# Patient Record
Sex: Female | Born: 1985 | Race: Black or African American | Hispanic: No | Marital: Single | Smoking: Current every day smoker
Health system: Southern US, Community
[De-identification: ages and names within clinical notes are randomized; demographics above are authoritative.]

## PROBLEM LIST (undated history)

## (undated) ENCOUNTER — Inpatient Hospital Stay: Payer: Self-pay

## (undated) DIAGNOSIS — F419 Anxiety disorder, unspecified: Secondary | ICD-10-CM

## (undated) DIAGNOSIS — Z8639 Personal history of other endocrine, nutritional and metabolic disease: Secondary | ICD-10-CM

## (undated) DIAGNOSIS — F32A Depression, unspecified: Secondary | ICD-10-CM

## (undated) DIAGNOSIS — Z915 Personal history of self-harm: Secondary | ICD-10-CM

## (undated) DIAGNOSIS — O24419 Gestational diabetes mellitus in pregnancy, unspecified control: Secondary | ICD-10-CM

## (undated) DIAGNOSIS — F329 Major depressive disorder, single episode, unspecified: Secondary | ICD-10-CM

## (undated) DIAGNOSIS — Z9151 Personal history of suicidal behavior: Secondary | ICD-10-CM

## (undated) DIAGNOSIS — E079 Disorder of thyroid, unspecified: Secondary | ICD-10-CM

## (undated) DIAGNOSIS — F1911 Other psychoactive substance abuse, in remission: Secondary | ICD-10-CM

## (undated) DIAGNOSIS — I10 Essential (primary) hypertension: Secondary | ICD-10-CM

## (undated) HISTORY — PX: WISDOM TOOTH EXTRACTION: SHX21

---

## 2005-12-23 ENCOUNTER — Emergency Department: Payer: Self-pay | Admitting: Emergency Medicine

## 2005-12-24 ENCOUNTER — Emergency Department: Payer: Self-pay | Admitting: Unknown Physician Specialty

## 2006-02-05 ENCOUNTER — Emergency Department: Payer: Self-pay | Admitting: Internal Medicine

## 2007-10-12 ENCOUNTER — Ambulatory Visit: Payer: Self-pay

## 2007-11-21 ENCOUNTER — Emergency Department: Payer: Self-pay | Admitting: Emergency Medicine

## 2008-06-17 ENCOUNTER — Emergency Department: Payer: Self-pay | Admitting: Unknown Physician Specialty

## 2009-03-04 ENCOUNTER — Emergency Department: Payer: Self-pay | Admitting: Emergency Medicine

## 2009-06-12 ENCOUNTER — Ambulatory Visit: Payer: Self-pay

## 2009-09-24 ENCOUNTER — Emergency Department: Payer: Self-pay | Admitting: Emergency Medicine

## 2009-10-21 ENCOUNTER — Emergency Department: Payer: Self-pay | Admitting: Emergency Medicine

## 2010-03-29 ENCOUNTER — Ambulatory Visit: Payer: Self-pay

## 2010-09-01 ENCOUNTER — Emergency Department: Payer: Self-pay | Admitting: Emergency Medicine

## 2012-05-30 ENCOUNTER — Encounter: Payer: Self-pay | Admitting: Obstetrics & Gynecology

## 2012-07-25 ENCOUNTER — Encounter: Payer: Self-pay | Admitting: Maternal & Fetal Medicine

## 2012-08-05 ENCOUNTER — Observation Stay: Payer: Self-pay

## 2012-08-05 LAB — URINALYSIS, COMPLETE
Blood: NEGATIVE
Ketone: NEGATIVE
Nitrite: NEGATIVE
Ph: 7 (ref 4.5–8.0)
Protein: NEGATIVE
Specific Gravity: 1.015 (ref 1.003–1.030)

## 2012-08-13 ENCOUNTER — Encounter: Payer: Self-pay | Admitting: Maternal & Fetal Medicine

## 2012-08-25 ENCOUNTER — Encounter: Payer: Self-pay | Admitting: Maternal & Fetal Medicine

## 2012-09-05 ENCOUNTER — Encounter: Payer: Self-pay | Admitting: Maternal and Fetal Medicine

## 2012-09-23 ENCOUNTER — Encounter: Payer: Self-pay | Admitting: Obstetrics and Gynecology

## 2012-09-24 ENCOUNTER — Emergency Department: Payer: Self-pay | Admitting: Emergency Medicine

## 2012-09-24 LAB — COMPREHENSIVE METABOLIC PANEL
Albumin: 2.6 g/dL — ABNORMAL LOW (ref 3.4–5.0)
Alkaline Phosphatase: 102 U/L (ref 50–136)
Anion Gap: 11 (ref 7–16)
BUN: 5 mg/dL — ABNORMAL LOW (ref 7–18)
Bilirubin,Total: 0.3 mg/dL (ref 0.2–1.0)
Calcium, Total: 8.2 mg/dL — ABNORMAL LOW (ref 8.5–10.1)
Chloride: 109 mmol/L — ABNORMAL HIGH (ref 98–107)
Potassium: 4 mmol/L (ref 3.5–5.1)
SGOT(AST): 8 U/L — ABNORMAL LOW (ref 15–37)
SGPT (ALT): 10 U/L — ABNORMAL LOW (ref 12–78)
Sodium: 141 mmol/L (ref 136–145)
Total Protein: 6.4 g/dL (ref 6.4–8.2)

## 2012-09-24 LAB — CBC
HGB: 11.6 g/dL — ABNORMAL LOW (ref 12.0–16.0)
MCH: 30.7 pg (ref 26.0–34.0)
MCHC: 34.6 g/dL (ref 32.0–36.0)
MCV: 89 fL (ref 80–100)
RBC: 3.77 10*6/uL — ABNORMAL LOW (ref 3.80–5.20)
RDW: 13.7 % (ref 11.5–14.5)

## 2012-09-24 LAB — URINALYSIS, COMPLETE
Bacteria: NONE SEEN
Blood: NEGATIVE
Glucose,UR: NEGATIVE mg/dL (ref 0–75)
Nitrite: NEGATIVE
RBC,UR: <1 /HPF (ref 0–5)
Specific Gravity: 1.015 (ref 1.003–1.030)
WBC UR: <1 /HPF (ref 0–5)

## 2012-09-26 ENCOUNTER — Encounter: Payer: Self-pay | Admitting: Maternal & Fetal Medicine

## 2012-09-30 ENCOUNTER — Encounter: Payer: Self-pay | Admitting: Maternal & Fetal Medicine

## 2012-10-01 ENCOUNTER — Observation Stay: Payer: Self-pay | Admitting: Obstetrics and Gynecology

## 2012-10-01 LAB — URINALYSIS, COMPLETE
Bacteria: NONE SEEN
Bilirubin,UR: NEGATIVE
Ph: 6 (ref 4.5–8.0)
Protein: 100
RBC,UR: 2158 /HPF (ref 0–5)
Squamous Epithelial: 1

## 2012-10-03 ENCOUNTER — Encounter: Payer: Self-pay | Admitting: Maternal and Fetal Medicine

## 2012-10-03 LAB — URINE CULTURE

## 2012-10-10 ENCOUNTER — Encounter: Payer: Self-pay | Admitting: Obstetrics and Gynecology

## 2012-10-14 ENCOUNTER — Encounter: Payer: Self-pay | Admitting: Obstetrics and Gynecology

## 2012-10-17 ENCOUNTER — Encounter: Payer: Self-pay | Admitting: Obstetrics and Gynecology

## 2012-10-21 ENCOUNTER — Encounter: Payer: Self-pay | Admitting: Obstetrics and Gynecology

## 2012-10-24 ENCOUNTER — Encounter: Payer: Self-pay | Admitting: Maternal and Fetal Medicine

## 2012-10-28 ENCOUNTER — Encounter: Payer: Self-pay | Admitting: Obstetrics & Gynecology

## 2012-10-28 ENCOUNTER — Observation Stay: Payer: Self-pay

## 2012-10-28 LAB — URINALYSIS, COMPLETE
Bacteria: NONE SEEN
Bilirubin,UR: NEGATIVE
Blood: NEGATIVE
Glucose,UR: NEGATIVE mg/dL (ref 0–75)
Ketone: NEGATIVE
Ph: 7 (ref 4.5–8.0)
Squamous Epithelial: 3

## 2012-10-30 ENCOUNTER — Observation Stay: Payer: Self-pay

## 2012-10-30 LAB — URINALYSIS, COMPLETE
Bilirubin,UR: NEGATIVE
Glucose,UR: NEGATIVE mg/dL (ref 0–75)
RBC,UR: 1 /HPF (ref 0–5)
Specific Gravity: 1.017 (ref 1.003–1.030)
Squamous Epithelial: 31
WBC UR: 7 /HPF (ref 0–5)

## 2012-11-01 ENCOUNTER — Observation Stay: Payer: Self-pay

## 2012-11-04 ENCOUNTER — Encounter: Payer: Self-pay | Admitting: Obstetrics and Gynecology

## 2012-11-06 LAB — URINALYSIS, COMPLETE
Bacteria: NONE SEEN
Bilirubin,UR: NEGATIVE
Blood: NEGATIVE
Glucose,UR: NEGATIVE mg/dL (ref 0–75)
Nitrite: NEGATIVE
Protein: 30
RBC,UR: 1 /HPF (ref 0–5)
Specific Gravity: 1.028 (ref 1.003–1.030)
Squamous Epithelial: 13
WBC UR: 5 /HPF (ref 0–5)

## 2012-11-06 LAB — TSH: Thyroid Stimulating Horm: 1.83 u[IU]/mL

## 2012-11-06 LAB — COMPREHENSIVE METABOLIC PANEL
Anion Gap: 13 (ref 7–16)
BUN: 8 mg/dL (ref 7–18)
Calcium, Total: 8.7 mg/dL (ref 8.5–10.1)
Co2: 18 mmol/L — ABNORMAL LOW (ref 21–32)
Creatinine: 0.54 mg/dL — ABNORMAL LOW (ref 0.60–1.30)
EGFR (African American): >60
EGFR (Non-African Amer.): >60
Osmolality: 269 (ref 275–301)
Potassium: 4.2 mmol/L (ref 3.5–5.1)
SGPT (ALT): 16 U/L (ref 12–78)
Sodium: 136 mmol/L (ref 136–145)

## 2012-11-06 LAB — CBC
HCT: 36.2 % (ref 35.0–47.0)
HGB: 13 g/dL (ref 12.0–16.0)
MCH: 32 pg (ref 26.0–34.0)
MCHC: 35.8 g/dL (ref 32.0–36.0)
MCV: 89 fL (ref 80–100)
RBC: 4.06 10*6/uL (ref 3.80–5.20)
WBC: 9.8 10*3/uL (ref 3.6–11.0)

## 2012-11-06 LAB — DRUG SCREEN, URINE
Amphetamines, Ur Screen: NEGATIVE (ref ?–1000)
Benzodiazepine, Ur Scrn: NEGATIVE (ref ?–200)
Cannabinoid 50 Ng, Ur ~~LOC~~: POSITIVE (ref ?–50)
Methadone, Ur Screen: NEGATIVE (ref ?–300)
Phencyclidine (PCP) Ur S: NEGATIVE (ref ?–25)

## 2012-11-06 LAB — ETHANOL: Ethanol %: <0.003 % (ref 0.000–0.080)

## 2012-11-06 LAB — ACETAMINOPHEN LEVEL: Acetaminophen: <2 ug/mL

## 2012-11-06 LAB — SALICYLATE LEVEL: Salicylates, Serum: <1.7 mg/dL

## 2012-11-07 ENCOUNTER — Encounter: Payer: Self-pay | Admitting: Obstetrics & Gynecology

## 2012-11-07 ENCOUNTER — Inpatient Hospital Stay: Payer: Self-pay | Admitting: Psychiatry

## 2012-11-08 ENCOUNTER — Observation Stay: Payer: Self-pay | Admitting: Obstetrics and Gynecology

## 2012-11-08 LAB — FOLATE: Folic Acid: 28.6 ng/mL (ref 3.1–100.0)

## 2012-11-09 ENCOUNTER — Inpatient Hospital Stay: Payer: Self-pay | Admitting: Psychiatry

## 2012-11-11 ENCOUNTER — Encounter: Payer: Self-pay | Admitting: Obstetrics and Gynecology

## 2012-11-11 LAB — DRUG SCREEN, URINE
Amphetamines, Ur Screen: NEGATIVE (ref ?–1000)
Barbiturates, Ur Screen: NEGATIVE (ref ?–200)
Benzodiazepine, Ur Scrn: NEGATIVE (ref ?–200)
Methadone, Ur Screen: NEGATIVE (ref ?–300)
Opiate, Ur Screen: NEGATIVE (ref ?–300)
Tricyclic, Ur Screen: NEGATIVE (ref ?–1000)

## 2012-11-14 ENCOUNTER — Encounter: Payer: Self-pay | Admitting: Obstetrics and Gynecology

## 2012-11-18 ENCOUNTER — Encounter: Payer: Self-pay | Admitting: Obstetrics & Gynecology

## 2012-11-23 ENCOUNTER — Observation Stay: Payer: Self-pay | Admitting: Obstetrics and Gynecology

## 2012-11-23 LAB — URINALYSIS, COMPLETE
Bacteria: NONE SEEN
Ketone: NEGATIVE
Leukocyte Esterase: NEGATIVE
Nitrite: NEGATIVE
Ph: 6 (ref 4.5–8.0)
Protein: NEGATIVE
Specific Gravity: 1.017 (ref 1.003–1.030)
WBC UR: 1 /HPF (ref 0–5)

## 2012-11-25 ENCOUNTER — Inpatient Hospital Stay: Payer: Self-pay | Admitting: Obstetrics and Gynecology

## 2012-11-25 LAB — CBC WITH DIFFERENTIAL/PLATELET
Basophil %: 0.3 %
Eosinophil %: 2.2 %
HCT: 32.8 % — ABNORMAL LOW (ref 35.0–47.0)
HGB: 11.1 g/dL — ABNORMAL LOW (ref 12.0–16.0)
Lymphocyte %: 24.3 %
MCHC: 34 g/dL (ref 32.0–36.0)
Monocyte %: 7.3 %
Neutrophil #: 6.4 10*3/uL (ref 1.4–6.5)
Platelet: 142 10*3/uL — ABNORMAL LOW (ref 150–440)
WBC: 9.7 10*3/uL (ref 3.6–11.0)

## 2012-11-25 LAB — DRUG SCREEN, URINE
Amphetamines, Ur Screen: NEGATIVE (ref ?–1000)
Barbiturates, Ur Screen: NEGATIVE (ref ?–200)
Benzodiazepine, Ur Scrn: NEGATIVE (ref ?–200)
Cocaine Metabolite,Ur ~~LOC~~: NEGATIVE (ref ?–300)
Methadone, Ur Screen: NEGATIVE (ref ?–300)
Opiate, Ur Screen: NEGATIVE (ref ?–300)
Phencyclidine (PCP) Ur S: NEGATIVE (ref ?–25)
Tricyclic, Ur Screen: NEGATIVE (ref ?–1000)

## 2012-11-26 LAB — HEMATOCRIT: HCT: 27 % — ABNORMAL LOW (ref 35.0–47.0)

## 2012-11-29 ENCOUNTER — Emergency Department: Payer: Self-pay | Admitting: Unknown Physician Specialty

## 2013-07-11 ENCOUNTER — Emergency Department: Payer: Self-pay | Admitting: Emergency Medicine

## 2013-07-11 LAB — COMPREHENSIVE METABOLIC PANEL
Alkaline Phosphatase: 73 U/L (ref 50–136)
BUN: 15 mg/dL (ref 7–18)
Bilirubin,Total: 0.3 mg/dL (ref 0.2–1.0)
Calcium, Total: 8.7 mg/dL (ref 8.5–10.1)
Chloride: 112 mmol/L — ABNORMAL HIGH (ref 98–107)
Co2: 23 mmol/L (ref 21–32)
EGFR (African American): >60
EGFR (Non-African Amer.): >60
Glucose: 109 mg/dL — ABNORMAL HIGH (ref 65–99)
Osmolality: 283 (ref 275–301)
Potassium: 4 mmol/L (ref 3.5–5.1)
SGPT (ALT): 10 U/L — ABNORMAL LOW (ref 12–78)
Sodium: 141 mmol/L (ref 136–145)
Total Protein: 7.6 g/dL (ref 6.4–8.2)

## 2013-07-11 LAB — URINALYSIS, COMPLETE
Bilirubin,UR: NEGATIVE
Ketone: NEGATIVE
Nitrite: NEGATIVE
Ph: 5 (ref 4.5–8.0)
Protein: 30
WBC UR: 5 /HPF (ref 0–5)

## 2013-07-11 LAB — TSH: Thyroid Stimulating Horm: 0.86 u[IU]/mL

## 2013-07-11 LAB — CBC
HCT: 37 % (ref 35.0–47.0)
MCH: 28.9 pg (ref 26.0–34.0)
MCHC: 32.8 g/dL (ref 32.0–36.0)
MCV: 88 fL (ref 80–100)
RDW: 14.6 % — ABNORMAL HIGH (ref 11.5–14.5)
WBC: 7.5 10*3/uL (ref 3.6–11.0)

## 2013-07-11 LAB — DRUG SCREEN, URINE
Barbiturates, Ur Screen: NEGATIVE (ref ?–200)
Benzodiazepine, Ur Scrn: POSITIVE (ref ?–200)
Cocaine Metabolite,Ur ~~LOC~~: POSITIVE (ref ?–300)
MDMA (Ecstasy)Ur Screen: NEGATIVE (ref ?–500)
Methadone, Ur Screen: NEGATIVE (ref ?–300)
Opiate, Ur Screen: NEGATIVE (ref ?–300)
Phencyclidine (PCP) Ur S: NEGATIVE (ref ?–25)

## 2013-07-11 LAB — ETHANOL
Ethanol %: <0.003 % (ref 0.000–0.080)
Ethanol: <3 mg/dL

## 2013-08-12 ENCOUNTER — Emergency Department: Payer: Self-pay | Admitting: Internal Medicine

## 2013-08-12 LAB — COMPREHENSIVE METABOLIC PANEL
Alkaline Phosphatase: 84 U/L (ref 50–136)
BUN: 12 mg/dL (ref 7–18)
Bilirubin,Total: 0.4 mg/dL (ref 0.2–1.0)
Calcium, Total: 9 mg/dL (ref 8.5–10.1)
Chloride: 106 mmol/L (ref 98–107)
Co2: 27 mmol/L (ref 21–32)
EGFR (African American): >60
EGFR (Non-African Amer.): >60
Glucose: 85 mg/dL (ref 65–99)
Potassium: 4.1 mmol/L (ref 3.5–5.1)
SGPT (ALT): 12 U/L (ref 12–78)
Sodium: 137 mmol/L (ref 136–145)
Total Protein: 7.8 g/dL (ref 6.4–8.2)

## 2013-08-12 LAB — URINALYSIS, COMPLETE
Bacteria: NONE SEEN
Bilirubin,UR: NEGATIVE
Glucose,UR: NEGATIVE mg/dL (ref 0–75)
RBC,UR: <1 /HPF (ref 0–5)
Squamous Epithelial: 2
WBC UR: 1 /HPF (ref 0–5)

## 2013-08-12 LAB — CBC
HGB: 14 g/dL (ref 12.0–16.0)
MCV: 87 fL (ref 80–100)
RBC: 4.68 10*6/uL (ref 3.80–5.20)
RDW: 13.8 % (ref 11.5–14.5)
WBC: 3.6 10*3/uL (ref 3.6–11.0)

## 2013-12-30 ENCOUNTER — Emergency Department: Payer: Self-pay | Admitting: Emergency Medicine

## 2013-12-30 LAB — CBC
HCT: 39.6 % (ref 35.0–47.0)
HGB: 13.1 g/dL (ref 12.0–16.0)
MCH: 29.3 pg (ref 26.0–34.0)
MCHC: 33.1 g/dL (ref 32.0–36.0)
MCV: 89 fL (ref 80–100)
PLATELETS: 154 10*3/uL (ref 150–440)
RBC: 4.46 10*6/uL (ref 3.80–5.20)
RDW: 13.7 % (ref 11.5–14.5)
WBC: 6.4 10*3/uL (ref 3.6–11.0)

## 2013-12-30 LAB — COMPREHENSIVE METABOLIC PANEL
ALK PHOS: 67 U/L
AST: 11 U/L — AB (ref 15–37)
Albumin: 3.7 g/dL (ref 3.4–5.0)
Anion Gap: 2 — ABNORMAL LOW (ref 7–16)
BUN: 9 mg/dL (ref 7–18)
Bilirubin,Total: 0.4 mg/dL (ref 0.2–1.0)
CALCIUM: 8.7 mg/dL (ref 8.5–10.1)
Chloride: 106 mmol/L (ref 98–107)
Co2: 26 mmol/L (ref 21–32)
Creatinine: 0.79 mg/dL (ref 0.60–1.30)
EGFR (Non-African Amer.): >60
Glucose: 91 mg/dL (ref 65–99)
Osmolality: 267 (ref 275–301)
Potassium: 3.8 mmol/L (ref 3.5–5.1)
SGPT (ALT): 12 U/L (ref 12–78)
SODIUM: 134 mmol/L — AB (ref 136–145)
Total Protein: 7.4 g/dL (ref 6.4–8.2)

## 2013-12-30 LAB — URINALYSIS, COMPLETE
BACTERIA: NONE SEEN
BILIRUBIN, UR: NEGATIVE
Blood: NEGATIVE
GLUCOSE, UR: NEGATIVE mg/dL (ref 0–75)
Ketone: NEGATIVE
LEUKOCYTE ESTERASE: NEGATIVE
Nitrite: NEGATIVE
Ph: 6 (ref 4.5–8.0)
Protein: NEGATIVE
RBC,UR: 2 /HPF (ref 0–5)
Specific Gravity: 1.025 (ref 1.003–1.030)
WBC UR: 1 /HPF (ref 0–5)

## 2013-12-30 LAB — LIPASE, BLOOD: LIPASE: 92 U/L (ref 73–393)

## 2013-12-30 LAB — HCG, QUANTITATIVE, PREGNANCY: Beta Hcg, Quant.: 486 m[IU]/mL — ABNORMAL HIGH

## 2014-01-06 ENCOUNTER — Observation Stay: Payer: Self-pay | Admitting: Obstetrics & Gynecology

## 2014-01-06 LAB — CBC WITH DIFFERENTIAL/PLATELET
BASOS ABS: 0 10*3/uL (ref 0.0–0.1)
Basophil %: 0.4 %
EOS ABS: 0.1 10*3/uL (ref 0.0–0.7)
Eosinophil %: 2.3 %
HCT: 37.9 % (ref 35.0–47.0)
HGB: 12.7 g/dL (ref 12.0–16.0)
LYMPHS PCT: 50 %
Lymphocyte #: 2.8 10*3/uL (ref 1.0–3.6)
MCH: 29.6 pg (ref 26.0–34.0)
MCHC: 33.6 g/dL (ref 32.0–36.0)
MCV: 88 fL (ref 80–100)
Monocyte #: 0.3 x10 3/mm (ref 0.2–0.9)
Monocyte %: 4.6 %
NEUTROS PCT: 42.7 %
Neutrophil #: 2.4 10*3/uL (ref 1.4–6.5)
PLATELETS: 182 10*3/uL (ref 150–440)
RBC: 4.31 10*6/uL (ref 3.80–5.20)
RDW: 13.5 % (ref 11.5–14.5)
WBC: 5.6 10*3/uL (ref 3.6–11.0)

## 2014-01-06 LAB — URINALYSIS, COMPLETE
BLOOD: NEGATIVE
Bilirubin,UR: NEGATIVE
Glucose,UR: NEGATIVE mg/dL (ref 0–75)
Ketone: NEGATIVE
Leukocyte Esterase: NEGATIVE
NITRITE: NEGATIVE
PROTEIN: NEGATIVE
Ph: 5 (ref 4.5–8.0)
Specific Gravity: 1.028 (ref 1.003–1.030)

## 2014-01-06 LAB — COMPREHENSIVE METABOLIC PANEL
ALBUMIN: 3.8 g/dL (ref 3.4–5.0)
AST: 15 U/L (ref 15–37)
Alkaline Phosphatase: 70 U/L
Anion Gap: 2 — ABNORMAL LOW (ref 7–16)
BUN: 9 mg/dL (ref 7–18)
Bilirubin,Total: 0.4 mg/dL (ref 0.2–1.0)
Calcium, Total: 9 mg/dL (ref 8.5–10.1)
Chloride: 105 mmol/L (ref 98–107)
Co2: 28 mmol/L (ref 21–32)
Creatinine: 0.77 mg/dL (ref 0.60–1.30)
EGFR (Non-African Amer.): >60
GLUCOSE: 95 mg/dL (ref 65–99)
Osmolality: 269 (ref 275–301)
Potassium: 3.9 mmol/L (ref 3.5–5.1)
SGPT (ALT): 14 U/L (ref 12–78)
SODIUM: 135 mmol/L — AB (ref 136–145)
Total Protein: 7.7 g/dL (ref 6.4–8.2)

## 2014-01-06 LAB — HCG, QUANTITATIVE, PREGNANCY: Beta Hcg, Quant.: 256 m[IU]/mL — ABNORMAL HIGH

## 2014-01-06 LAB — WET PREP, GENITAL

## 2014-01-06 LAB — GC/CHLAMYDIA PROBE AMP

## 2014-01-07 LAB — HCG, QUANTITATIVE, PREGNANCY: Beta Hcg, Quant.: 212 m[IU]/mL — ABNORMAL HIGH

## 2014-01-22 ENCOUNTER — Ambulatory Visit: Payer: Self-pay | Admitting: Family Medicine

## 2014-03-05 ENCOUNTER — Emergency Department: Payer: Self-pay | Admitting: Emergency Medicine

## 2014-03-05 LAB — URINALYSIS, COMPLETE
BACTERIA: NONE SEEN
BILIRUBIN, UR: NEGATIVE
GLUCOSE, UR: NEGATIVE mg/dL (ref 0–75)
Ketone: NEGATIVE
Leukocyte Esterase: NEGATIVE
NITRITE: NEGATIVE
PH: 5 (ref 4.5–8.0)
Protein: NEGATIVE
RBC,UR: 11 /HPF (ref 0–5)
Specific Gravity: 1.011 (ref 1.003–1.030)
Squamous Epithelial: NONE SEEN

## 2014-03-05 LAB — COMPREHENSIVE METABOLIC PANEL
ALK PHOS: 74 U/L
Albumin: 3.4 g/dL (ref 3.4–5.0)
Anion Gap: 5 — ABNORMAL LOW (ref 7–16)
BILIRUBIN TOTAL: 0.4 mg/dL (ref 0.2–1.0)
BUN: 6 mg/dL — ABNORMAL LOW (ref 7–18)
CHLORIDE: 108 mmol/L — AB (ref 98–107)
CO2: 27 mmol/L (ref 21–32)
Calcium, Total: 8.8 mg/dL (ref 8.5–10.1)
Creatinine: 0.87 mg/dL (ref 0.60–1.30)
Glucose: 108 mg/dL — ABNORMAL HIGH (ref 65–99)
Osmolality: 278 (ref 275–301)
POTASSIUM: 3.5 mmol/L (ref 3.5–5.1)
SGPT (ALT): 8 U/L — ABNORMAL LOW (ref 12–78)
Sodium: 140 mmol/L (ref 136–145)
Total Protein: 7.4 g/dL (ref 6.4–8.2)

## 2014-03-05 LAB — CBC WITH DIFFERENTIAL/PLATELET
Basophil #: 0 10*3/uL (ref 0.0–0.1)
Basophil %: 0.4 %
EOS PCT: 2.3 %
Eosinophil #: 0.2 10*3/uL (ref 0.0–0.7)
HCT: 37.1 % (ref 35.0–47.0)
HGB: 12 g/dL (ref 12.0–16.0)
LYMPHS ABS: 3 10*3/uL (ref 1.0–3.6)
LYMPHS PCT: 33.7 %
MCH: 29 pg (ref 26.0–34.0)
MCHC: 32.3 g/dL (ref 32.0–36.0)
MCV: 90 fL (ref 80–100)
MONOS PCT: 7 %
Monocyte #: 0.6 x10 3/mm (ref 0.2–0.9)
NEUTROS ABS: 5 10*3/uL (ref 1.4–6.5)
NEUTROS PCT: 56.6 %
PLATELETS: 168 10*3/uL (ref 150–440)
RBC: 4.14 10*6/uL (ref 3.80–5.20)
RDW: 13.6 % (ref 11.5–14.5)
WBC: 8.8 10*3/uL (ref 3.6–11.0)

## 2014-03-05 LAB — LIPASE, BLOOD: LIPASE: 126 U/L (ref 73–393)

## 2014-11-09 ENCOUNTER — Emergency Department: Payer: Self-pay | Admitting: Emergency Medicine

## 2014-12-21 ENCOUNTER — Emergency Department: Payer: Self-pay | Admitting: Emergency Medicine

## 2014-12-21 LAB — COMPREHENSIVE METABOLIC PANEL
Albumin: 3.6 g/dL (ref 3.4–5.0)
Alkaline Phosphatase: 62 U/L
Anion Gap: 7 (ref 7–16)
BILIRUBIN TOTAL: 0.7 mg/dL (ref 0.2–1.0)
BUN: 8 mg/dL (ref 7–18)
CALCIUM: 8.9 mg/dL (ref 8.5–10.1)
CHLORIDE: 105 mmol/L (ref 98–107)
Co2: 23 mmol/L (ref 21–32)
Creatinine: 0.69 mg/dL (ref 0.60–1.30)
EGFR (African American): >60
Glucose: 102 mg/dL — ABNORMAL HIGH (ref 65–99)
Osmolality: 269 (ref 275–301)
Potassium: 3.9 mmol/L (ref 3.5–5.1)
SGOT(AST): 18 U/L (ref 15–37)
SGPT (ALT): 10 U/L — ABNORMAL LOW
Sodium: 135 mmol/L — ABNORMAL LOW (ref 136–145)
TOTAL PROTEIN: 7.9 g/dL (ref 6.4–8.2)

## 2014-12-21 LAB — URINALYSIS, COMPLETE
Bacteria: NONE SEEN
Bilirubin,UR: NEGATIVE
Blood: NEGATIVE
GLUCOSE, UR: NEGATIVE mg/dL (ref 0–75)
Ketone: NEGATIVE
Leukocyte Esterase: NEGATIVE
NITRITE: NEGATIVE
PROTEIN: NEGATIVE
Ph: 6 (ref 4.5–8.0)
RBC,UR: NONE SEEN /HPF (ref 0–5)
Specific Gravity: 1.025 (ref 1.003–1.030)
Squamous Epithelial: 4
WBC UR: NONE SEEN /HPF (ref 0–5)

## 2014-12-21 LAB — CBC WITH DIFFERENTIAL/PLATELET
BASOS PCT: 0.7 %
Basophil #: 0 10*3/uL (ref 0.0–0.1)
Eosinophil #: 0.6 10*3/uL (ref 0.0–0.7)
Eosinophil %: 10.9 %
HCT: 41 % (ref 35.0–47.0)
HGB: 13.4 g/dL (ref 12.0–16.0)
LYMPHS PCT: 43.8 %
Lymphocyte #: 2.3 10*3/uL (ref 1.0–3.6)
MCH: 28.7 pg (ref 26.0–34.0)
MCHC: 32.6 g/dL (ref 32.0–36.0)
MCV: 88 fL (ref 80–100)
Monocyte #: 0.2 x10 3/mm (ref 0.2–0.9)
Monocyte %: 4.1 %
NEUTROS ABS: 2.1 10*3/uL (ref 1.4–6.5)
Neutrophil %: 40.5 %
PLATELETS: 190 10*3/uL (ref 150–440)
RBC: 4.66 10*6/uL (ref 3.80–5.20)
RDW: 14.3 % (ref 11.5–14.5)
WBC: 5.2 10*3/uL (ref 3.6–11.0)

## 2014-12-21 LAB — HCG, QUANTITATIVE, PREGNANCY: Beta Hcg, Quant.: 50060 m[IU]/mL — ABNORMAL HIGH

## 2015-01-05 LAB — OB RESULTS CONSOLE VARICELLA ZOSTER ANTIBODY, IGG: Varicella: IMMUNE

## 2015-01-05 LAB — OB RESULTS CONSOLE RUBELLA ANTIBODY, IGM: Rubella: IMMUNE

## 2015-01-05 LAB — OB RESULTS CONSOLE HIV ANTIBODY (ROUTINE TESTING): HIV: NONREACTIVE

## 2015-01-05 LAB — OB RESULTS CONSOLE ANTIBODY SCREEN: ANTIBODY SCREEN: NEGATIVE

## 2015-01-05 LAB — OB RESULTS CONSOLE ABO/RH: RH Type: POSITIVE

## 2015-01-05 LAB — OB RESULTS CONSOLE GC/CHLAMYDIA
CHLAMYDIA, DNA PROBE: NEGATIVE
Gonorrhea: NEGATIVE

## 2015-01-05 LAB — OB RESULTS CONSOLE HEPATITIS B SURFACE ANTIGEN: Hepatitis B Surface Ag: NEGATIVE

## 2015-01-05 LAB — OB RESULTS CONSOLE RPR: RPR: NONREACTIVE

## 2015-01-08 ENCOUNTER — Emergency Department: Payer: Self-pay | Admitting: Emergency Medicine

## 2015-01-11 LAB — BETA STREP CULTURE(ARMC)

## 2015-01-24 ENCOUNTER — Encounter
Admit: 2015-01-24 | Disposition: A | Discharge status: Home / Self Care | Payer: Self-pay | Admitting: Obstetrics and Gynecology

## 2015-02-23 ENCOUNTER — Emergency Department: Payer: Self-pay | Admitting: Emergency Medicine

## 2015-04-13 NOTE — Consult Note (Signed)
Referral Information:   Reason for Referral 29 yo G3 P0201 EDD 12/04/2012 by LMP consistent with 13 weeks ultrasound performed at [facility] on 05/30/12.  She is now 21 weeks 1 day and is referred due to history of preterm birth and infant mortality at 1 year of life.  In 2000, she had a pregnancy complicated by fetal growth restriction, lisencephaly, and congenital heart defect (vsd, pulm hypertension).  The karyotype was normal.  This child was born via cesarean section at 35 weeks, due to worsening IUGR and breech status.  He later died at the age of 1.    Referring Physician [facility] Department    Prenatal Hx as above    Past Obstetrical Hx 2000, primary LTCS(breech, worsening IUGR), [redacted] weeks gestation, female 'Tyshon', 1975g. 2002, repeat c/s (arrest of dilation) at 75-37w, female, 2930g   Home Medications: Medication Instructions Status  Prenatal Multivitamins oral tablet 1 tab(s) orally once a day Active  progesterone 1  subcutaneous once a week--17P injection Active   Allergies:   Bananas: Itching, Resp. Distress, Swelling  Peanuts: Itching, Resp. Distress, Swelling  Aspirin: N/V/Diarrhea, Other  Vital Signs/Notes:  Nursing Vital Signs: **Vital Signs.:   01-Aug-13 08:05   Vital Signs Type Routine   Temperature Temperature (F) 97.8   Celsius 36.5   Temperature Source oral   Pulse Pulse 79   Pulse source if not from Vital Sign Device dinamap   Respirations Respirations 14   Systolic BP Systolic BP 107   Diastolic BP (mmHg) Diastolic BP (mmHg) 61   Mean BP 76   BP Source  if not from Vital Sign Device dinamap   Perinatal Consult:   PMed Hx Hx of varicella    Past Medical History cont'd history of hospitalization for postpartum depression following birth of first child--suicidal ideation.    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 Previous pregnancy complicated by severe fetal growth restriction and anomalies.  Normal karyotype.  She had a consultation with our genetic counselors and is aware of the sporadic nature of this consteallation of findings and of the low risk for recurrence.  We reviewed plans for surveillance.  I would recommend fetal growth assessment(as outlined below) and a fetal echocardiogram in the current pregnancy. 2. Previous late preterm births.  One of the deliveries was an iatrogenic preterm birth due to severe growth restriction.  She reports spontaneous labor at ~36 weeks in the second pregnancy and delivery due to repet c/s because of arrest of dilaation. Her baseline cervical length was >3cm today. She is currently receiving 17 hydroxy progesterone injections for reduction of recurrent preterm birth risk. 3. Previous history of hospitalization for severe postpartum depression following birth of first child.    Recommendations 1. Recommend follow up growth surveillance at ~28 and 34 weeks 2. Recommend fetal echocardiogram (scheduled). 3. Recommend close observation and psychological support during pregnancy and postpartum. She reports use of "[facility]" services for psychological support.  I encouraged her to notify providers if she feels her mood is worsening in order for her to initiate antidepressant agents prior to delivery, if necessary. 4. Previous cesarean delivery x 2--planning repeat cesarean   Plan:   Genetic Counseling yes    Prenatal  Diagnosis Options Level II US    Ultrasound at what gestational ages 4628 and 534 weeks    Delivery Mode Cesarean     Total Time Spent with Patient 45 minutes    >50% of visit spent in couseling/coordination of care yes    Office Use Only 99243  Level 3 (40min) NEW office consult detailed   Coding Description: MATERNAL  CONDITIONS/HISTORY INDICATION(S).   Previous C-section.   Previous stillborn or neonatal death - Poor Reproductive History.   OTHER: previous child with growth restriction, lisencephaly, cardiac defect.  Electronic Signatures: Consuelo PandySmall, Mane Consolo (MD)  (Signed 01-Aug-13 13:25)  Authored: Referral, Home Medications, Allergies, Vital Signs/Notes, Consult, Impression, Plan, Billing, Coding Description   Last Updated: 01-Aug-13 13:25 by Consuelo PandySmall, Garima Chronis (MD)

## 2015-04-13 NOTE — Consult Note (Signed)
Referral Information:   Reason for Referral Admission for suicide attempt in pregnancy    Referring Physician [facility] ED    Prenatal Hx Capitola is a 29 year-old G3 P0201 with an EDC of 12/04/12 who is currently at 57 1/7 weeks who presented to the ED today following a suicide attempt where she cut her leg. She has currently been involuntarily committed and will be likely managed as an inpatient in the psychiatry unit at [facility].  She has received her prenatal care with the [facility] Department and is planning on being delivered by the [facility] group. We have been consulting due to fetal growth restriction in the present pregnancy.  Furthemore, we saw her on 07/25/12 (MFM consult by Dr. Dolphus Jenny) for history of prior preterm birth and history of servere depression.  [place] at [facility] on 10/14/12 demonstrated an EFW of 1656g (5%) with normal umbilical artery dopplers. She continued to be seen by [place] for fetal testing. Followup growth scan on 11/04/12 demonstrated an EFW of 2399g (17%) with the [facility] measuring at the 29% but all other measurements <5%.  Recommendations were made at that time for continued twice weekly fetal testing due to the evidence of prolonged fetal growth restriction since 29 weeks and consideration for delivery at 39 weeks, despite the most recent growth scan demonstrating an EFW >10%.  Emelie reports excellent fetal movement and denies contractions, vaginal bleeding or leakage of fluid. She reports trying to hurt herself because she was "depressed" but denies trying to hurt her baby.  See prior MFM consultation dated 07/25/12 for full history.   Home Medications: Medication Instructions Status  Prenatal Multivitamins oral tablet 1 tab(s) orally once a day Active  ranitidine 150 mg oral tablet 1 tab(s) orally 2 times a day Active  Zofran 4 mg oral tablet 1 tab(s) orally every 8 hours, As Needed- for Nausea, Vomiting  Active   Allergies:   Bananas: Itching,  Resp. Distress, Swelling  Peanuts: Itching, Resp. Distress, Swelling  Aspirin: N/V/Diarrhea, Other  Vital Signs/Notes:  Nursing Vital Signs: **Vital Signs.:   14-Nov-13 12:12   Systolic BP Systolic BP 121   Diastolic BP (mmHg) Diastolic BP (mmHg) 58   Review Of Systems:   Subjective Excellent fetal movement. No contractions, abdominal pain, edema, blurry vision, headaches, leakage of fluid or vaginal bleeding.   Exam:   Abdomen Soft, ND, NT, No guarding.     Additional Lab/Radiology Notes [place] today (11/07/12): Biophysical profile 8/8, AFI 17.8, cephalic presentation, mean umbilical artery S/D 2.33 (upper limit of normal for this gestational age 4.0)  First trimester screen (06/01/12): Down syndrome risk less than 1 in 10,000. Trisomy 18/13 risk less than 1 in 10,000.  Labs [facility] ED, 11/07/12): Cr 0.5, bili 0.4, AST 19, ALT 16, TSH 1.83, Hct 36.2, Plt 162, MCV 89,  Tox screen: THC positive.  Neg ETOH, Amphetamine, Opiates, Benzos, Methadone, Acetominophen, Salicylates   Impression/Recommendations:   Impression 29 year-old G3 P0201 at 47 1/7 weeks with suicide attempt being involuntarily commited for inpatient hospitalization. Her pregnancy is complicated by fetal growth restriction first diagnosed at 29 weeks, though her most recent growth scan was more appropriate.  Fetal testing today is reassuring.    Recommendations -Contine twice weekly fetal testing. As she is being admitted to [facility], she should be transported to the [facility] office in the [facility] on Mondays and Thursdays for her testing. -Recommend delivery by repeat c-section at approximately 39 weeks. [facility]  [facility] -Should she be stable for discharge from the psychiatry unit prior to 39 weeks, she will continue twice weekly fetal testing in our office until delivery. -If Psychiatry has any questions about medications in pregnancy, please call our office. The on  call MFM attending can also be reached at [phone].     Total Time Spent with Patient 30 minutes    >50% of visit spent in couseling/coordination of care yes    Office Use Only 99214  Office Visit Level 4 (25min) EST detailed office/outpt   Coding Description: MATERNAL CONDITIONS/HISTORY INDICATION(S).   OTHER: Depression, suicide attempt.  Electronic Signatures: Menaal Russum, [place] (MD)  (Signed [phone]-Nov-13 13:02)  Authored: Referral, Home Medications, Allergies, Vital Signs/Notes, Exam, Lab/Radiology Notes, Impression, Billing, Coding Description   Last Updated: 14-Nov-13 13:02 by Appolonia Ackert, [place] (MD)

## 2015-04-13 NOTE — Discharge Summary (Signed)
PATIENT NAME:  [name], [name] MR#:  [id number] DATE OF BIRTH:  Jun 24, 1986  DATE OF ADMISSION:  11/07/2012 DATE OF DISCHARGE:  11/08/2012  HOSPITAL COURSE: This is 29 year old woman was admitted to the hospital under involuntary commitment on 11/07/2102 by Dr. Caryn SectionAarti Kapur. The patient was reporting symptoms of sadness and depression and suicidal thoughts and wishes to die. She had toxicology screen positive for cocaine and marijuana. The patient is currently [redacted] weeks pregnant. She had been resistant to recommendations for outpatient psychiatric treatment of her depression. She has not had a stable place to live recently. The patient was refusing to consider antidepressant medicine. In the hospital, for the first hospital day, she was cooperative; however, on the evening of 11/08/2012 she informed staff that she was having abdominal pain and cramping. The obstetrics team was notified and have transferred her up to the obstetrics floor for monitoring and evaluation to see whether or not she is in labor. If the patient stays on OB, obviously I will follow her closely as a consultant and once she no longer needs to be on OB we will immediately be ready to transfer her back to psychiatry.   DISCHARGE MEDICATIONS:  1. Prenatal vitamin. 2. Zantac 150 mg twice a day.   LABORATORY RESULTS: Laboratory results done on 11/06/2012 showed a drug screen positive for cannabis and cocaine, TSH normal at 1.83, alcohol undetectable. Chemistry panel showed creatinine low at 0.54, CO2 low at 18, alkaline phosphatase elevated at 209, and albumin low at 2.8. Hematology panel was normal. Urinalysis showed protein and epithelial cells, probable contamination, not treated as an infection. Acetaminophen and salicylates undetected.   MENTAL STATUS EXAM: The patient is a pregnant 29 year old woman. Emotionally she has not been displaying any affect distress, not reporting any suicidal or homicidal ideation. She is reporting some pain  at the time of discharge. Thoughts appear lucid. The patient is cooperative with treatment plan.   DISPOSITION: Discharge from psychiatry for immediate admission to OB/GYN with a one-to-one sitter.    DIAGNOSIS PRINCIPLE AND PRIMARY:   AXIS I: Depression, not otherwise specified.   SECONDARY DIAGNOSES:   AXIS I:  1. Cocaine dependence.  2. Marijuana abuse.   AXIS II: Deferred.   AXIS III: 35 week intrauterine pregnancy.   AXIS IV: Severe from homelessness, unwanted pregnancy, severity of depression.   AXIS V: Functioning at time of discharge 29. ____________________________ Marie AmelJohn T. Clapacs, MD jtc:slb D: 11/08/2012 22:22:00 ET T: 11/11/2012 09:56:20 ET JOB#: [id number]  cc: Marie AmelJohn T. Clapacs, MD, <Dictator> Marie AmelJOHN T CLAPACS MD ELECTRONICALLY SIGNED 11/11/2012 10:30

## 2015-04-13 NOTE — Op Note (Signed)
PATIENT NAME:  [name], [name] MR#:  [id number] DATE OF BIRTH:  1986-11-09  DATE OF PROCEDURE:  11/25/2012  PREOPERATIVE DIAGNOSIS: Term gestation, 338 + 5 weeks, active labor.  POSTOPERATIVE DIAGNOSIS: Term gestation, 7038 + 5 weeks, active labor.  PROCEDURE: Repeat low transverse cesarean section.   SURGEON: Suzy Bouchardhomas J. Schermerhorn, M.D.   FIRST ASSISTANT: Virginia Rochesterrr  ANESTHESIA: Spinal.   INDICATION: This is a 29 year old gravida 3, Paraschos 2. The patient was admitted to labor and delivery with cervical change and bloody show and regular contractions.   DESCRIPTION OF PROCEDURE: After adequate spinal anesthesia, the patient was placed in dorsal supine position with a hip roll under the right side. The patient's abdomen was prepped and draped in normal sterile fashion. Pfannenstiel incision was made two fingerbreadths above the symphysis pubis. Sharp dissection was used to identify the fascia. The fascia was opened in the midline and opened in a transverse fashion. The superior aspect of the fascia was grasped with Kocher clamps and the recti muscles dissected free. Inferior aspect of the fascia was grasped with Kocher clamps and pyramidalis muscle was dissected without difficulty. The peritoneal cavity was opened sharply. The vesicouterine peritoneal fold was identified, a bladder flap was created, and the bladder was reflected inferiorly. A low transverse uterine incision was made. Upon entry into the endometrial cavity, clear fluid resulted. The incision was extended with blunt transverse traction. Vacuum was applied to the fetal occiput. One gentle pull allowed for delivery of the head. Vacuum was removed and shoulders delivered without difficulty. A vigorous female was passed to Dr. Awanda MinkGaliote after doubly clamping the infant's cord. Apgars were assigned as 9 and 9. Placenta was manually removed and the uterus was exteriorized. Intravenous Pitocin was administered. The endometrial cavity was wiped clean  with laparotomy tape and the cervix was opened with a ring forceps and this was passed off the field. The uterine incision was closed with one chromic suture in a running locking fashion with good approximation of edges. Good hemostasis was noted. The fallopian tubes and ovaries appeared normal. The left fallopian tube had some tethering of the ovary to the posterior aspect of the uterus which was taken down with the Bovie. The posterior cul-de-sac was suctioned and the uterus was placed back into the abdominal cavity. The uterine incision again appeared hemostatic. The paracolic gutters were wiped clean with laparotomy tape. The On-Q pump system was brought onto the operative field, catheters were placed infraumbilically, and placed subfascially. The fascia was then closed above the two catheters, with 0 Vicryl suture, in a running nonlocking fashion. The subcutaneous tissues were irrigated and bovied for hemostasis and the skin was reapproximated with staples. On-Q pump catheters were secured at the skin level with Dermabond, and Steri-Stripped to the skin, and Tegaderm placed across this, and both catheters loaded with 5 mL of 0.5% Marcaine. There were no complications. The patient did receive 2 grams IV Ancef prior to commencement of the case. Estimated blood loss 100 mL. Intraoperative fluids 1000 mL. The patient was taken to the Recovery Room in good condition.  ____________________________ Suzy Bouchardhomas J. Schermerhorn, MD tjs:slb D: 11/25/2012 07:08:28 ET T: 11/25/2012 09:58:34 ET JOB#: [id number]  cc: Suzy Bouchardhomas J. Schermerhorn, MD, <Dictator> Suzy BouchardHOMAS J SCHERMERHORN MD ELECTRONICALLY SIGNED 11/27/2012 18:00

## 2015-04-13 NOTE — Discharge Summary (Signed)
PATIENT NAME:  [name], [name] DATE OF BIRTH:  March 30, 1986  DATE OF ADMISSION:  11/09/2012 DATE OF DISCHARGE:  11/12/2012  HOSPITAL COURSE: See dictated History and Physical for details of admission. This 29 year old woman was admitted to the hospital because of depression with stated suicidal ideation. The patient was [redacted] weeks pregnant at the time of admission and there was additional concern that her behavior would pose a danger to the unborn baby. The patient had recently been abusing substances as well. In the hospital, the patient did not show acute behavior problems. She did not behave in a dangerous or aggressive manner. She denied suicidal ideation and after the first or second day was consistent in stating that she had no thought at all of harming herself. Also it was clear that she had no intention or thought of harming the unborn baby. The patient was offered the recommendation of starting antidepressant medication to treat her depression but she declined on the grounds of her pregnancy. She was educated about the risks and benefits and still preferred not to take any antidepressant medication. She participated in groups and activities appropriately and showed some insight into her condition and some willingness to work on improving her situation. The patient had to be technically discharged from the unit on at least one occasion because of abdominal pain that required transfer to the obstetrics floor for fetal monitoring. Once it was clear that she was not in labor she was brought back down to psychiatry. Although on paper these appear as admissions and discharges, in fact there was one sustained hospitalization just with a couple of false alarms about the labor. The patient was cooperative with treatment offered by OB during her time in the hospital. She was compliant with taking prescribed prenatal vitamins. The patient found an arrangement ultimately for a safe living environment.  She made contact with the father of her unborn baby and he was willing to have her come back and stay with him for the time being. The patient thought that this was a safe and appropriate environment. She was [facility]   MEDICATIONS AT THE TIME OF DISCHARGE:   1. Prenatal vitamin with iron supplement. 2. Zantac 150 mg q.12 h. p.r.n. for gastric reflux.   LABORATORY STUDIES: The patient had admission labs done on 11/13 that showed a positive drug screen for cocaine and cannabis. TSH normal at 1.8. Alcohol undetected. Alkaline phosphatase elevated at 209, CO2 low at 18, albumin low at 2.88. The CBC was normal. Urinalysis likely contaminated with high numbers of epithelial cells. Acetaminophen and salicylates both nontoxic.   MENTAL STATUS EXAMINATION: Neatly dressed and groomed woman who appears her stated age. Cooperative and pleasant during the interview. Makes good eye contact. She has normal psychomotor activity. Normal speech, rate, and tone. Affect is euthymic, smiling, upbeat, appropriate. Mood stated as good. Thoughts are lucid with no loosening of associations or delusions evident. Denies hallucinations. Denies suicidal or homicidal ideation. Shows good insight and judgment overall. Understands the risks of continued substance abuse to herself and to the unborn baby. Intelligence average. Alert and oriented times four.   DIAGNOSIS PRINCIPLE AND PRIMARY:  AXIS I: Depression, not otherwise specified.   SECONDARY DIAGNOSES:  AXIS I: Polysubstance dependence.   AXIS II: Deferred.   AXIS III: Intrauterine pregnancy, about 36 weeks' gestation.   AXIS IV: Moderate to severe from ongoing homelessness, legal problems,  lack of resources.   AXIS V: Functioning at time of discharge: 55.   ____________________________ Audery Amel, MD jtc:bjt D: 11/17/2012 23:02:08 ET T: 11/18/2012 14:12:20  ET JOB#: [id number]  cc: Audery Amel, MD, <Dictator> Audery Amel MD ELECTRONICALLY SIGNED 11/19/2012 11:33

## 2015-04-13 NOTE — Consult Note (Signed)
PATIENT NAME:  [name], [name] MR#:  [id number] DATE OF BIRTH:  January 15, 1986  DATE OF CONSULTATION:  11/07/2012  REFERRING PHYSICIAN:  Maricela Bo, MD  CONSULTING PHYSICIAN:  Doralee Albino. Maryruth Bun, MD  REASON FOR CONSULTATION: Suicidal thoughts and polysubstance abuse.   IDENTIFYING INFORMATION: [name] is a 29 year old single African American female currently homeless and most recently staying with her sister in the [place] area. She is currently [redacted] weeks pregnant.   HISTORY OF PRESENT ILLNESS:  [name] is a 29 year old single African American female currently [redacted] weeks pregnant with a history of recurrent depression and two prior inpatient psychiatric hospitalizations at [facility] and [facility] who presented to the Emergency Room via mobile crisis reporting suicidal thoughts at the referral of her outpatient therapist, Harriett Sine, at [facility]. The patient had also cut her right thigh with a razor. She reported feeling sad and depressed since even before her pregnancy. She was reporting suicidal thoughts when she came to the Emergency Room but denied any homicidal thoughts or desire to hurt the baby. The patient says that she cuts when she gets angry. She said that she got angry with her sister yesterday. They had got into a fight and that she had to leave the house. The patient denied any cocaine use although toxicology screen was positive for cocaine. Toxicology screen was also positive for marijuana and she did admit to using marijuana on an almost daily basis throughout her pregnancy. The patient does admit to significant conflict with her mother and her sister. She is not currently supported by the father of the baby and endorses feelings of hopelessness and helplessness with regards to the pregnancy. She said she has nowhere to stay and no money. The patient denied any psychotic symptoms such as auditory or visual hallucinations. No paranoid thoughts or delusions. Affect was somewhat irritable in the  Emergency Room and the patient was very defensive with regards to substance use and stated that the toxicology screen must be a mistake. She does endorse feelings of anhedonia, low energy level, decreased appetite and says that she is sleeping excessively. She has been getting prenatal care through [facility] as well as [facility].   PAST PSYCHIATRIC HISTORY: The patient has had two prior inpatient psychiatric hospitalizations from [facility] and [facility]. She has been seen by a therapist, Harriett Sine, at [facility] and her last appointment was Tuesday of this week. She does report having tried to hang herself in the past as well as overdosing. She said she started cutting in her teens and cuts when she gets angry. She remembers being on Zoloft and Abilify in the past but cannot remember any other psychotropic medication. She said she is not currently on medications and does not want to take medications due to her pregnancy.   SUBSTANCE ABUSE HISTORY: As stated in the history of present illness, the patient has been using marijuana throughout her pregnancy. Toxicology screen was positive for cocaine although the patient denies any recent cocaine use. The patient does have a history of heavy alcohol use, but says that she has not been drinking throughout her pregnancy. She denies any opioid abuse in the past. She does smoke 4 packs of cigarettes per day.   FAMILY PSYCHIATRIC HISTORY: The patient said, "I don't know."   PAST MEDICAL HISTORY:  1. [redacted] weeks pregnant.  2. History of having had a child with birth defects that died at age 29. 3. History of two C-sections.  4.  Gastroesophageal reflux disease.   She gets prenatal care from [facility] as well as the health department. She denies any history of any prior TBI or seizures.   OUTPATIENT MEDICATIONS:  1. Prenatal vitamin daily. 2. Ranitidine 150 mg p.o. twice daily.   ALLERGIES: Aspirin, bananas and peanuts.   SOCIAL  HISTORY: The patient was born and raised in [place] by her mother primarily. Her parents were not together. She says she does not get along with her father. In addition, she does have significant conflicts with her mother and sister. She has a tenth grade education and never got her GED. She is unable to find a job and has been unemployed for several years. The patient never married. She has an 28 year old son, but her mother now has custody of the son because she was unable to take care of the son. The father of the 32 year old son is in prison. The patient does report also having another pregnancy in which the child died at [age] year of age prematurely of heart defects. The patient says that she is currently homeless. She was most recently staying with her sister but says her sister will not allow her to stay there any longer.   LEGAL HISTORY: The patient does report a history of cocaine possession in the past and having had a stolen car.   MENTAL STATUS EXAM: [name] is a 29 year old pregnant African American female who is lying in her hospital bed in burgundy scrub pants and a lime green shirt. She was fully alert and oriented to time, place, and situation. Speech was regular rate and rhythm, fluent and coherent. Mood was depressed and affect was irritable. Thought processes were linear, logical, and goal-directed. She did endorse suicidal thoughts with a plan to cut herself. She denied any homicidal thoughts or psychotic symptoms. She denied any auditory or visual hallucinations. She denied any intent to harm the baby. Attention and concentration are fairly good. Recall was three out of three initially and three out of three after five minutes. She named the prior presidents as Obama, Bush and then [employer]. She did not spell world backwards correctly and could not do any serial sevens. When asked about simple proverbs, patient said, "I don't know".   SUICIDE RISK ASSESSMENT: At this time the patient  remains at a moderately elevated risk of harm to herself as well as her baby due to suicidal thoughts. She denies any access to guns. She is unable to contract for safety outside of the hospital. She does appear to have a number of psychosocial stressors including financial problems, unemployment.   REVIEW OF SYSTEMS: CONSTITUTIONAL: She denies any weakness, fatigue or weight changes. She denies any fever, chills, or night sweats. HEAD: She denies any headaches or dizziness. EYES: She denies any diplopia or blurred vision. ENT: She denies any hearing loss, neck pain or throat pain. RESPIRATORY: She denies any shortness breath or cough. CARDIOVASCULAR: She denies any chest pain or orthopnea. GASTROINTESTINAL: She denies any nausea, vomiting, or abdominal pain. She denies any change in bowel movements. GENITOURINARY: She denies incontinence or problems with frequency of urine. ENDOCRINE: She denies heat or cold intolerance. LYMPHATIC: She denies anemia or easy bruising. MUSCULOSKELETAL: She denies any muscle or joint pain. NEUROLOGICAL: She denies any tingling or weakness. PSYCHIATRIC: Please see history of present illness.   PHYSICAL EXAMINATION: VITAL SIGNS: Blood pressure 112/52, heart rate 73, respirations 20, temperature 97.6, pulse ox 96% on room air. Please see initial physical exam as  completed by ER physician.   LABORATORY, DIAGNOSTIC, AND RADIOLOGICAL DATA: Sodium 136, potassium 4.2, chloride 105, CO2 18, BUN 8, creatinine 0.54, glucose 69, AST 19, ALT 16. Alkaline phosphatase 209. TSH 1.83. Urine tox screen was positive for cocaine and cannabis. CBC within normal limits. Urinalysis showed 30 protein, trace leukocyte esterase, 5 WBC, no bacteria. Acetaminophen and salicylate levels were unremarkable.   DIAGNOSES:  AXIS I:  1. Major depressive disorder, recurrent, without psychotic features.  2. Cocaine abuse.  3. Cannabis dependence.  4. Nicotine abuse.  5. History of alcohol dependence, in  full remission.   AXIS II: Deferred.   AXIS III: [redacted] weeks pregnant with a history of having had a child born prematurely with first defects that died at the age of [age].   AXIS IV: Severe. Financial problems, housing problems, lack of primary support, conflict with mother and sister.   AXIS V: GAF at present equals 25.   ASSESSMENT AND TREATMENT RECOMMENDATIONS: [name] is a 29 year old single African American female currently [redacted] weeks pregnant. She is endorsing suicidal thoughts and is unable to contract for safety outside of the hospital. In addition, she has been abusing cocaine and marijuana. At this time the patient is going to be seeing a [facility], Dr. Leone BrandGrotegut, where she has been followed in the past in order to get her NST. We will await recommendations from OB with regard to stability of pregnancy. The patient should be admitted to inpatient psychiatry due to suicidal thoughts [facility]. She may benefit from being in a tertiary care center due to high risk pregnancy. We will await recommendations from Dr. Leone BrandGrotegut with [facility]. At this time she should be placed on suicide precautions and one-to-one observation secondary to suicidal thoughts. At this time the patient is opposed to any antidepressant medication as she is worried about the risks to the fetus. We will continue to discuss starting on an antidepressant medication for the patient once she has returned and been cleared from OB. At this time the patient remains under an IVC.    ____________________________ Doralee AlbinoAarti K. Maryruth BunKapur, MD akk:ap D: 11/07/2012 13:21:02 ET T: 11/07/2012 13:49:06 ET JOB#: [id number] cc: Nina Hoar K. Maryruth BunKapur, MD, <Dictator> Darliss RidgelAARTI K Page Lancon MD ELECTRONICALLY SIGNED 11/08/2012 16:06

## 2015-04-13 NOTE — Discharge Summary (Signed)
PATIENT NAME:  [name], Rayanna B MR#:  [id number] DATE OF BIRTH:  1986-05-22  DATE OF ADMISSION:  11/09/2012 DATE OF DISCHARGE:  11/11/2012   HOSPITAL COURSE: See the dictated history and physical on the full chart for the details. This 29 year old woman is currently on the Behavioral Health Unit for management of depression, suicidal ideation, and substance abuse. Complicating this is that she is 35 to [redacted] weeks pregnant. The patient has not displayed any dangerous behavior in several days. She denies any suicidal ideation. She indicates that she does not want to hurt the baby and that she has positive things in her life that she is looking forward to. She has been calm and appropriately interactive. Today she has to be discharged from the unit because she is going to a perinatal checkup with OB/GYN in the [facility]. The plan is for her to be transported to her appointment and then to be brought afterwards and readmitted to the hospital. This, therefore, is not a discharge in the sense of actually going for a lasting period of leaving the hospital but is done so that she can be administratively properly managed with this outpatient appointment. The patient is agreeable to coming back to the unit.   MENTAL STATUS EXAM: Calm and appropriate behavior. Normal psychomotor activity. Speech normal rate, tone, and volume. No sign of psychosis. Denies suicidal or homicidal ideation. Insight improved. Judgment chronically mildly impaired.   LABORATORY RESULTS: No new labs since the last time she was discharged from the hospital.   DISCHARGE MEDICATIONS: 1. Ranitidine 150 mg q.12 hours. 2. Multivitamin once per day.  3. Folic acid 1 mg per day.   DISPOSITION: She will be transported over to the [facility] for her outpatient perinatal visit and then will be readmitted to Psychiatry.   DIAGNOSIS PRINCIPLE AND PRIMARY:  AXIS I: Depression, not otherwise specified.    SECONDARY DIAGNOSES:   AXIS I: Polysubstance dependence.   AXIS II: Antisocial traits.   AXIS III: 35 to [redacted] weeks pregnant.   AXIS IV: Severe from homelessness.   AXIS V: Functioning at time of discharge 50.   ____________________________ Audery AmelJohn T. Delaney Schnick, MD jtc:drc D: 11/11/2012 09:59:37 ET T: 11/11/2012 12:06:03 ET JOB#: [id number]  cc: Audery AmelJohn T. Panayiotis Rain[place], MD, <Dictator> Audery AmelJOHN T Jathniel Smeltzer MD ELECTRONICALLY SIGNED 11/11/2012 13:00

## 2015-04-13 NOTE — Consult Note (Signed)
PATIENT NAME:  [name], [name] MR#:  [id number] DATE OF BIRTH:  Feb 02, 1986  DATE OF CONSULTATION:  11/26/2012  REFERRING PHYSICIAN:   CONSULTING PHYSICIAN:  Audery Amel, MD  IDENTIFYING INFORMATION AND REASON FOR CONSULT: This is a 29 year old woman currently in the hospital for Cesarean delivery of a baby. Consult to evaluate her mental health needs prior to discharge.   HISTORY OF PRESENT ILLNESS: Information obtained from the history and the chart. This patient is known to me from her recent behavioral health admission. She was on the [facility] service for a couple of weeks up until the third week of November. At that time there was concern about her depression and the potential for suicidality. She had made some statements that suggested possible suicidal ideation. During that hospital stay she declined the recommendation of antidepressant medication but did participate appropriately in therapy. The patient tells me that since discharge she has been to her outpatient provider one time and has seen a therapist. They did not suggest starting medication for her at that time either. She tells me that her mood has been feeling good. She has not had any return of depressive symptoms. She is feeling upbeat and optimistic. She is sleeping adequately. No new health complaints. Totally denies any suicidal or homicidal ideation. Denies any hallucinations or psychotic symptoms. She says that she has been avoiding the abuse of all drugs and alcohol since admission. She tells me that at this point she feels like she is doing well without antidepressant medication. She has not been behaving in a dangerous manner and was responsible in coming in to have the baby as scheduled.   PAST PSYCHIATRIC HISTORY: The patient has had several prior psychiatric admissions starting in her teens. She has been treated with antidepressants in the past but is not clear how well they have ever worked. She has a history of  self-mutilation on several occasions. She has a history of abuse of multiple drugs but particularly has been abusing cocaine recently. She had a two week or so stay on the Behavioral Health Unit in November during which time she did not take antidepressant medication at her choice but did show improvement in her mood and behavior.   SOCIAL HISTORY: The patient has already had children that have been taken out of her custody. She most recently had been homeless before her last hospitalization. Since leaving the hospital on November 19th, she has been staying with her sister. She initially had planned to stay with the father of her baby but that did not work out so she got in touch with her mother who encouraged her to make up with her sister. The two of them have reconciled and the patient is going to be able to continue living there.   PAST MEDICAL HISTORY: The patient has a past history of delivery by Cesarean section requiring a scheduled Cesarean section this time. She has a history of gastroesophageal reflux as well.   CURRENT MEDICATIONS:  1. Ranitidine 150 mg twice a day.  2. Prenatal vitamin.   ALLERGIES: Aspirin as well as some food allergies.   REVIEW OF SYSTEMS: She denies depression. Says her energy has been good. Denies suicidal ideation. Denies feeling of hopelessness. Denies any hallucinations. Generally negative review of systems.   MENTAL STATUS EXAM: The patient was interviewed in her hospital room. The baby is with her. The patient was cooperative with the interview. Made good eye contact. Psychomotor activity normal. Speech rate and tone and  amount was normal. Affect smiling and upbeat. Mood stated as good. Thoughts are lucid without loosening of associations. No sign of delusional thinking. Denies auditory or visual hallucinations. Denies suicidal or homicidal ideation. Shows improved insight and judgment. Alert and oriented x4. Baseline intelligence normal.   LABORATORY RESULTS:  The patient's drug screen on admission this time was positive for cannabis. However, we did determine that her drug screen was still positive for cannabis at the time of her discharge from the hospital as well. I do not think we can definitively conclude whether she has been or has not been using drugs in the interim.   ASSESSMENT: This is a 29 year old woman with a history of depressive symptoms and behavioral problems and major social stresses. Just delivered a full-term baby. The patient is otherwise in good health. She is denying any acute depressive symptoms. She does not appear to be currently dangerous to herself or others. The patient does not meet criteria for depressive disorder at this time and it is not clear if antidepressants have been helpful for her in the past. I brought up the idea with her but because she is breastfeeding and because she just does not want to start any new medicine she would prefer not to start any antidepressants. I think that is a reasonable decision. The patient does need outpatient ongoing supportive therapy as well as counseling about her substance abuse problem.   TREATMENT PLAN:  1. Supportive and educational therapy done regarding depression and substance abuse.  2. No new medications ordered.  3. I called [facility] to confirm that she has a follow-up appointment. Because she is in the hospital we had to reschedule it. She has an appointment to see Ross [facility]ancy Stevens [facility] on Wednesday, December 11th at 11 a.m. The phone number there is [phone]. The address is [address]  4. I will follow-up as needed while she is in the hospital.   DIAGNOSIS PRINCIPLE AND PRIMARY:  AXIS I: Depression, not otherwise specified.   SECONDARY DIAGNOSES:  AXIS I: Marijuana abuse.   AXIS II: Deferred.   AXIS III: Status post Cesarean delivery of full-term baby.   AXIS IV: Severe ongoing stress financially and socially.   AXIS V: Functioning at time of  evaluation 60.   ____________________________ Audery AmelJohn T. Joshua Soulier, MD jtc:drc D: 11/26/2012 16:50:35 ET T: 11/26/2012 17:33:32 ET JOB#: [id number]  cc: Audery AmelJohn T. Dimitrios Balestrieri, MD, <Dictator> Audery AmelJOHN T Erandi Lemma MD ELECTRONICALLY SIGNED 11/26/2012 22:47

## 2015-04-13 NOTE — Consult Note (Signed)
Brief Consult Note: Diagnosis: depression nos.   Patient was seen by consultant.   Consult note dictated.   Recommend further assessment or treatment.   Comments: Psychiatry: Patient seen for consult regarding her history of depression. This patient is known to me from her recent hospitalization on the behavioral health service. Today she is reporting no symptoms of depression. Mood is feeling upbeat and good. Denies any suicidal or homicidal thoughts. She is articulate and lucid. She tells me she is planning to breast-feed . She has already been to see her outpatient mental health followup provider one time since her last discharge. At this point I do not think we need to start any psychiatric medicine while she is in the hospital. I have made arrangements for her to have a followup appointment scheduled for Wednesday, December 11 at 11:[facility] The telephone number at that facility is [phone].  Electronic Signatures: Clapacs, Jackquline DenmarkJohn T (MD)  (Signed 03-Dec-13 16:39)  Authored: Brief Consult Note   Last Updated: 03-Dec-13 16:39 by Audery Amellapacs, John T (MD)

## 2015-04-17 NOTE — Consult Note (Signed)
27 y/oi BF LMP 12/27; + intercourse since that time.  No contraception   CC:  Sudden onset RLQ pain  HPI:  pain with sudden onset at RLQ approx 2 days ago..  Pain has spersisted and spiked this afternoon leading to near syncopal episode.  PMH:  N/C              healthy             Allergic to ASA  ROS: neg except as above  PE:  Afeb   VSS          NAD  A&Ox4\  Abd: tender to push at RLQ; no peritoneal Sx Extrem:  NT    Radiology:  U/S shows  8 cm mass at RLQ C/W Hem CL; small hypoechoic area at left ovary.  + free fluis with complexity Lab:   HCG of 450    A/P:  29 y/o BF at approxe 12 days since LMP with abdominal pain and radiologic evidence C/W ruptured HCL    -  recheck Quant Thurs a.m.    -  Rx Norco 5/325 #30x0, 1-2 Q6 prn    -  No work/sex/vigorous activity this week    -  If pain becomes unbearable, will consider laporocscopy; too early too locate gestation (unlikely rupyured ectopic given stability)  Electronic Signatures: Margaretha GlassingEvans, Ricky L (MD)  (Signed on 06-Jan-15 17:08)  Authored  Last Updated: 06-Jan-15 17:08 by Margaretha GlassingEvans, Ricky L (MD)

## 2015-04-22 ENCOUNTER — Observation Stay
Admit: 2015-04-22 | Disposition: A | Discharge status: Home / Self Care | Payer: Self-pay | Attending: Obstetrics and Gynecology | Admitting: Obstetrics and Gynecology

## 2015-04-23 ENCOUNTER — Emergency Department: Admit: 2015-04-23 | Disposition: A | Discharge status: Home / Self Care | Payer: Self-pay | Admitting: Student

## 2015-04-23 LAB — DRUG SCREEN, URINE
AMPHETAMINES, UR SCREEN: NEGATIVE
BARBITURATES, UR SCREEN: NEGATIVE
Benzodiazepine, Ur Scrn: NEGATIVE
Cannabinoid 50 Ng, Ur ~~LOC~~: POSITIVE
Cocaine Metabolite,Ur ~~LOC~~: POSITIVE
MDMA (ECSTASY) UR SCREEN: NEGATIVE
Methadone, Ur Screen: NEGATIVE
OPIATE, UR SCREEN: NEGATIVE
Phencyclidine (PCP) Ur S: NEGATIVE
Tricyclic, Ur Screen: NEGATIVE

## 2015-04-23 LAB — URINALYSIS, COMPLETE
BLOOD: NEGATIVE
Bilirubin,UR: NEGATIVE
Bilirubin,UR: NEGATIVE
Blood: NEGATIVE
Glucose,UR: NEGATIVE mg/dL (ref 0–75)
Glucose,UR: NEGATIVE mg/dL (ref 0–75)
KETONE: NEGATIVE
Ketone: NEGATIVE
NITRITE: NEGATIVE
Nitrite: NEGATIVE
PROTEIN: NEGATIVE
Ph: 7 (ref 4.5–8.0)
Ph: 6 (ref 4.5–8.0)
Protein: NEGATIVE
SPECIFIC GRAVITY: 1.025 (ref 1.003–1.030)
Specific Gravity: 1.026 (ref 1.003–1.030)

## 2015-04-23 LAB — COMPREHENSIVE METABOLIC PANEL
ALBUMIN: 2.8 g/dL — AB
ALK PHOS: 51 U/L
ALT: 8 U/L — AB
Anion Gap: 5 — ABNORMAL LOW (ref 7–16)
BUN: 8 mg/dL
Bilirubin,Total: 0.4 mg/dL
CHLORIDE: 108 mmol/L
Calcium, Total: 8.1 mg/dL — ABNORMAL LOW
Co2: 21 mmol/L — ABNORMAL LOW
Creatinine: 0.49 mg/dL
EGFR (African American): >60
EGFR (Non-African Amer.): >60
GLUCOSE: 94 mg/dL
Potassium: 3.8 mmol/L
SGOT(AST): 12 U/L — ABNORMAL LOW
Sodium: 134 mmol/L — ABNORMAL LOW
Total Protein: 6.1 g/dL — ABNORMAL LOW

## 2015-04-23 LAB — CBC
HCT: 32 % — ABNORMAL LOW (ref 35.0–47.0)
HGB: 10.8 g/dL — ABNORMAL LOW (ref 12.0–16.0)
MCH: 30.2 pg (ref 26.0–34.0)
MCHC: 33.8 g/dL (ref 32.0–36.0)
MCV: 89 fL (ref 80–100)
PLATELETS: 178 10*3/uL (ref 150–440)
RBC: 3.58 10*6/uL — AB (ref 3.80–5.20)
RDW: 14.2 % (ref 11.5–14.5)
WBC: 8.3 10*3/uL (ref 3.6–11.0)

## 2015-04-23 LAB — ETHANOL

## 2015-04-23 LAB — SALICYLATE LEVEL: Salicylates, Serum: <4 mg/dL

## 2015-04-23 LAB — ACETAMINOPHEN LEVEL: Acetaminophen: <10 ug/mL

## 2015-04-24 LAB — URINE CULTURE

## 2015-05-03 ENCOUNTER — Other Ambulatory Visit: Payer: Self-pay | Admitting: Family Medicine

## 2015-05-03 DIAGNOSIS — Z8279 Family history of other congenital malformations, deformations and chromosomal abnormalities: Secondary | ICD-10-CM

## 2015-05-04 NOTE — H&P (Signed)
L&D Evaluation:  History:  HPI 29 yo [id number] with LMP of 12/05/14 with EDD of 08/13/15 and no US peformed to give dating. Pt arrived this pm due to bilat groin and lower pubic area pain starting this pm and rates it as an 8 on 1-10 scale. +nausea, No vomiting, no fever, no ROM, no VB, +FHR. [facility]. No dysuria, no urgency, no frequency.   Presents with abdominal pain   Patient's Medical History Thyroid ? disease, abnormal Paps, Kidney stones   Patient's Surgical History LTCS x 3   Medications Pre Natal Vitamins   Allergies NKDA   Social History none   Family History Non-Contributory   ROS:  ROS All systems were reviewed.  HEENT, CNS, GI, GU, Respiratory, CV, Renal and Musculoskeletal systems were found to be normal.   Exam:  Vital Signs stable  Bp 123/78     P76   R16   T97.6   General no apparent distress   Mental Status clear   Chest clear   Heart normal sinus rhythm   Abdomen gravid, non-tender   Estimated Fetal Weight Average for gestational age   Back no CVAT   Edema no edema   Reflexes 1+   Clonus negative   Pelvic Closed/long   Mebranes Intact   FHT +Doppler FHR   Ucx absent   Skin dry   Lymph no lymphadenopathy   Impression:  Impression IUP at 24 weeks with lower groin and pubic area pain   Plan:  Plan UA, Anatomy US ordered   Comments Pt has not had a dating UD nor an anatomy scan and fundal ht is 19-20 weeks size   Electronic Signatures: Sharee PimpleJones, Pricsilla Lindvall W (CNM)  (Signed 28-Apr-16 23:12)  Authored: L&D Evaluation   Last Updated: 28-Apr-16 23:12 by Sharee PimpleJones, Emory Leaver W (CNM)

## 2015-05-04 NOTE — H&P (Signed)
L&D Evaluation:  History:   HPI 29 yo G3P2 @ 38+5 weeks presented with ctx and bloody d/c . Cx  2cm which is a change from 2 days prior. 2 prior c/s desires third. h/o drug use    Patient's Medical History depression , hsv. tobacco use, prior child with multiple  anomalies    Patient's Surgical History Previous C-Section  x2    Medications Pre [place]atal Vitamins    Allergies asa    Social History tobacco    Family History Non-Contributory   Exam:   Vital Signs stable    General no apparent distress    Mental Status clear    Chest clear    Heart normal sinus rhythm    Abdomen gravid, non-tender    Estimated Fetal Weight Average for gestational age    Pelvic 2 cm + bloody show    FHT normal rate with no decels    Ucx regular    Ucx Frequency 4 min   Impression:   Impression labor , regular ctx and prior c/s   Plan:   Plan repeat c/s and on Q pump. Drug screen ordered   Electronic Signatures: Schermerhorn, Ihor Austinhomas J (MD)  (Signed 02-Dec-13 05:58)  Authored: L&D Evaluation   Last Updated: 02-Dec-13 05:58 by Schermerhorn, Ihor Austinhomas J (MD)
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
## 2015-05-04 NOTE — H&P (Signed)
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 Pt feels pain is 20%  improved since arrival. Denies UC's, ROM,decreased FM or VB.    Presents with abdominal pain    Patient's Medical History HSV, Trich, suicide attempts x 3, depression,    Patient's Surgical History LEEP    Medications Pre Natal Vitamins    Allergies ASA causes N&V    Social History tobacco    Family History Non-Contributory   ROS:   ROS All systems were reviewed.  HEENT, CNS, GI, GU, Respiratory, CV, Renal and Musculoskeletal systems were found to be normal.   Exam:   Vital Signs stable    General no apparent distress    Mental Status clear    Chest clear    Heart normal sinus rhythm, no murmur/gallop/rubs    Abdomen gravid, non-tender    Estimated Fetal Weight Average for gestational age    Back no CVAT    Edema no edema    Reflexes 1+    Mebranes not evalated due to no labor S/S    FHT normal rate with no decels    Ucx absent    Skin dry    Lymph no lymphadenopathy   Impression:   Impression IUP at 22 weeks with round lig pain   Plan:   Plan REst, Percocet for pain    Comments Disc findings with pt and she agrees to take a Percocet and poss go home once improved. Pain is in the lower pelvis where the presenting part is located. No pain palp of upper abd. No UC's palpated and pt denies them. NO ?ROM, VB. Urine ess neg for UTI.   Electronic Signatures: Sharee PimpleJones, Janari Yamada W (CNM)  (Signed 12-Aug-13 20:48)  Authored: L&D Evaluation   Last Updated: 12-Aug-13 20:48 by Sharee PimpleJones, Aryav Wimberly W (CNM)

## 2015-05-17 DIAGNOSIS — R87613 High grade squamous intraepithelial lesion on cytologic smear of cervix (HGSIL): Secondary | ICD-10-CM | POA: Insufficient documentation

## 2015-05-20 ENCOUNTER — Ambulatory Visit (HOSPITAL_BASED_OUTPATIENT_CLINIC_OR_DEPARTMENT_OTHER)
Admission: RE | Admit: 2015-05-20 | Discharge: 2015-05-20 | Disposition: A | Discharge status: Home / Self Care | Payer: Medicaid Other | Source: Ambulatory Visit | Attending: Family Medicine | Admitting: Family Medicine

## 2015-05-20 ENCOUNTER — Ambulatory Visit
Admission: RE | Admit: 2015-05-20 | Discharge: 2015-05-20 | Disposition: A | Discharge status: Home / Self Care | Payer: Medicaid Other | Source: Ambulatory Visit | Attending: Maternal & Fetal Medicine | Admitting: Maternal & Fetal Medicine

## 2015-05-20 VITALS — BP 120/65 | HR 93 | Temp 97.7°F | Resp 18 | Ht 59.0 in | Wt 131.0 lb

## 2015-05-20 DIAGNOSIS — O09293 Supervision of pregnancy with other poor reproductive or obstetric history, third trimester: Secondary | ICD-10-CM | POA: Insufficient documentation

## 2015-05-20 DIAGNOSIS — Z8279 Family history of other congenital malformations, deformations and chromosomal abnormalities: Secondary | ICD-10-CM

## 2015-05-20 DIAGNOSIS — F329 Major depressive disorder, single episode, unspecified: Secondary | ICD-10-CM | POA: Diagnosis not present

## 2015-05-20 DIAGNOSIS — O34219 Maternal care for unspecified type scar from previous cesarean delivery: Secondary | ICD-10-CM

## 2015-05-20 DIAGNOSIS — Z3A28 28 weeks gestation of pregnancy: Secondary | ICD-10-CM

## 2015-05-20 DIAGNOSIS — O09213 Supervision of pregnancy with history of pre-term labor, third trimester: Secondary | ICD-10-CM

## 2015-05-20 DIAGNOSIS — O09893 Supervision of other high risk pregnancies, third trimester: Secondary | ICD-10-CM

## 2015-05-20 DIAGNOSIS — Z87891 Personal history of nicotine dependence: Secondary | ICD-10-CM | POA: Diagnosis not present

## 2015-05-20 DIAGNOSIS — O3421 Maternal care for scar from previous cesarean delivery: Secondary | ICD-10-CM

## 2015-05-20 DIAGNOSIS — Z8639 Personal history of other endocrine, nutritional and metabolic disease: Secondary | ICD-10-CM

## 2015-05-20 HISTORY — DX: Personal history of other endocrine, nutritional and metabolic disease: Z86.39

## 2015-05-20 HISTORY — DX: Personal history of self-harm: Z91.5

## 2015-05-20 HISTORY — DX: Major depressive disorder, single episode, unspecified: F32.9

## 2015-05-20 HISTORY — DX: Other psychoactive substance abuse, in remission: F19.11

## 2015-05-20 HISTORY — DX: Personal history of suicidal behavior: Z91.51

## 2015-05-20 HISTORY — DX: Depression, unspecified: F32.A

## 2015-05-20 LAB — US OB DETAIL + 14 WK

## 2015-05-20 LAB — T4, FREE: Free T4: 0.81 ng/dL (ref 0.61–1.12)

## 2015-05-20 LAB — TSH: TSH: 0.53 u[IU]/mL (ref 0.350–4.500)

## 2015-05-20 NOTE — Progress Notes (Addendum)
[facility] Consultation   Chief Complaint:   HPI: [name] is a 29 y.o. gravida 5 para 1212 at 25w3dgestation by  [id number] [facility] with [facility]) who presents in consultation from  [name] - [facility] The [facility] due to a history of a previous child who died at 1 62ear of age with brain anomalies, cardiac anomaly, seizures and growth restriction.  The patient met with our genetic counselor, Marie Larryin the patient's previous pregnancy.  At that [facility]'s anomalies.  Fetal surveillance for her last pregnancy included an anatomy USKoreand a fetal echo.  Her last pregnancy was complicated by fetal growth restriction, substance abuse and depression.  The patient is here today for detailed anatomy USKoreato meet with our genetic counselor and for maternal-fetal medicine consultation.  The patient believes she is here to discuss her history of thyroid disease.  She believes thyroid disease was a suspected cause of previous fetal growth restriction.  The patient has had a quad screen this pregnancy.    Obstetric History:  1. 2000 C/S at 3574eeks gestation of 1996gale infant.  Infant death at 1 70ear of age due to multiple anomalies.  See above. 2. 2002 C/S at 36-[redacted] weeks gestation of 2930g female infant after PPROM. 3. 11/25/2012 C/S at [redacted] weeks gestation of 5 lb 4 oz female infant. 2016 - early Sab - no D & C    Gynecologic History:   LMP 11/25/2014   Past Medical History: Patient  has a past medical history of Depression; History of suicide attempt; History of substance abuse; and History of thyroid disease.   Past Surgical History: She  has past surgical history that includes Cesarean section (2000, 2002, 2013).   Medications: PNV  Allergies: Patient is allergic  to asa.   Social History: Patient  reports that she has quit smoking. She does not have any smokeless tobacco history on file. She reports that she does not drink alcohol or use illicit drugs. She did test positive for cannabinoids earlier this pregnancy.  The patient does not work. She lives with her mother.  This fetus has the same father as her last child.    Family History: family history includes Diabetes in her mother.   Review of Systems A full 12 point review of systems was negative.  The patient states that her mood is good.  Physical Exam: BP 120/65 mmHg  Pulse 93  Temp(Src) 97.7 F (36.5 C)  Resp 18  Ht _0  (1.499 m)  Wt 131 lb (59.421 kg)  BMI 26.44 kg/m2  SpO2 99% The patient had an H & P on L & D on 04/22/2015.  Today she is here for ultrasound and advice only.  USKoreaoday - cephalic, EFW [id number], normal fetal anatomy with bilateral echogenic foci in the fetal cardiac ventricles, normal fluid, no previa   Asessement/Recommendations:  2940o gravida 5 para 1212 at 2850w3dth:  1. History of previous child with multiple anomalies including brain and heart defects.  No genetic diagnosis.  Bilateral echogenic foci. -- Our genetic counselor will follow-up on the quad screen results -- The patient will be scheduled for a fetal echo 2. History of fetal growth restriction in her last pregnancy -- Repeat US Korear growth in 4 weeks  and 8 weeks  3. History of PPROM at 36-[redacted] weeks gestation 4. History of depression with suicide attempts in the past -- Has been referred to the [facility] for mental health care 5. History of substance abuse with mariajuana use this pregnancy - states she is not using now 6. Vague history of thyroid disease -- Free T4 and TSH 7. History of previous cesarean delivery x 3 -- For repeat cesarean delivery -- I recommended tubal ligation at the time of repeat cesarean due to progressive risks with repeat procedures.  The patient will consider.   -- The  patient should be referred for OBGYN care.  She is known to [facility] and is referred there for further PN care.     Total time spent with the patient was 60 minutes with greater than 50% spent in counseling and coordination of care.  We appreciate this interesting consult and will be happy to be involved in the ongoing care of [name] in anyway her obstetricians desire.  Erasmo Score, MD [facility]  Addendum:   TSH was 0.53 (normal) and Free T4 was .81 (normal).    Erasmo Score, MD [facility]

## 2015-06-03 NOTE — Addendum Note (Signed)
Encounter addended by: Lady Deutscher, MD on: 06/03/2015 10:04 AM<BR>     Documentation filed: Notes Section

## 2015-06-17 ENCOUNTER — Ambulatory Visit
Admission: RE | Admit: 2015-06-17 | Discharge: 2015-06-17 | Disposition: A | Discharge status: Home / Self Care | Payer: Medicaid Other | Source: Ambulatory Visit | Attending: Internal Medicine | Admitting: Internal Medicine

## 2015-06-17 DIAGNOSIS — IMO0002 Reserved for concepts with insufficient information to code with codable children: Secondary | ICD-10-CM

## 2015-07-05 ENCOUNTER — Inpatient Hospital Stay
Admission: EM | Admit: 2015-07-05 | Discharge: 2015-07-05 | Disposition: A | Discharge status: Home / Self Care | Payer: Medicaid Other | Attending: Obstetrics and Gynecology | Admitting: Obstetrics and Gynecology

## 2015-07-15 ENCOUNTER — Inpatient Hospital Stay: Admission: RE | Admit: 2015-07-15 | Payer: Self-pay | Source: Ambulatory Visit

## 2015-07-26 ENCOUNTER — Observation Stay
Admission: EM | Admit: 2015-07-26 | Discharge: 2015-07-26 | Disposition: A | Discharge status: Home / Self Care | Payer: Medicaid Other | Attending: Obstetrics and Gynecology | Admitting: Obstetrics and Gynecology

## 2015-07-26 DIAGNOSIS — Z3A38 38 weeks gestation of pregnancy: Secondary | ICD-10-CM | POA: Diagnosis not present

## 2015-07-26 LAB — CBC
HCT: 30.7 % — ABNORMAL LOW (ref 35.0–47.0)
Hemoglobin: 10.5 g/dL — ABNORMAL LOW (ref 12.0–16.0)
MCH: 30.2 pg (ref 26.0–34.0)
MCHC: 34 g/dL (ref 32.0–36.0)
MCV: 88.6 fL (ref 80.0–100.0)
Platelets: 157 10*3/uL (ref 150–440)
RBC: 3.47 MIL/uL — ABNORMAL LOW (ref 3.80–5.20)
RDW: 14.1 % (ref 11.5–14.5)
WBC: 9.7 10*3/uL (ref 3.6–11.0)

## 2015-07-26 LAB — CHLAMYDIA/NGC RT PCR (ARMC ONLY)
Chlamydia Tr: NOT DETECTED
N gonorrhoeae: NOT DETECTED

## 2015-07-26 LAB — TYPE AND SCREEN
ABO/RH(D): O POS
ANTIBODY SCREEN: NEGATIVE

## 2015-07-26 LAB — ABO/RH: ABO/RH(D): O POS

## 2015-07-26 MED ORDER — TERBUTALINE SULFATE 1 MG/ML IJ SOLN
0.2500 mg | Freq: Once | INTRAMUSCULAR | Status: AC | PRN
  Administered 2015-07-26: 0.25 mg via SUBCUTANEOUS

## 2015-07-26 MED ORDER — LACTATED RINGERS IV SOLN
INTRAVENOUS | Status: DC
  Administered 2015-07-26: 04:00:00 via INTRAVENOUS

## 2015-07-26 MED ORDER — ACETAMINOPHEN 325 MG PO TABS: 650.0000 mg | ORAL_TABLET | ORAL | Status: DC | PRN

## 2015-07-26 MED ORDER — TERBUTALINE SULFATE 1 MG/ML IJ SOLN
INTRAMUSCULAR | Status: AC
  Administered 2015-07-26: 0.25 mg via SUBCUTANEOUS
  Filled 2015-07-26: qty 1

## 2015-07-26 MED ORDER — PRENATAL MULTIVITAMIN CH: 1.0000 | ORAL_TABLET | Freq: Every day | ORAL | Status: DC

## 2015-07-26 NOTE — Discharge Planning (Signed)
Pt was dc'd per Dr Feliberto Gottron for rule out labor

## 2015-07-26 NOTE — Progress Notes (Signed)
Patient ID: [name], female   DOB: 1986-10-14, 29 y.o.   MRN: [id number] G5 P3 at 37 weeks , pt with ctx early am . No LOF , NO vag bleeding . Prior c/s x 3 .  PMHX HSV  PSHX c/s x3 MEds VAltrex Allergies ASA ROS unremarkable  O: VSS  Abd soft  Pelvic cx 1cm/ 60/oop NST : reassurring A: prior c/s , no active labor  P: d/c home  Precautions given to pt to RTC if continued CTX  needs to make appt at [facility] for scheduled c/s

## 2015-07-27 LAB — RPR: RPR: NONREACTIVE

## 2015-09-25 ENCOUNTER — Encounter: Payer: Self-pay | Admitting: Emergency Medicine

## 2015-09-25 ENCOUNTER — Emergency Department
Admission: EM | Admit: 2015-09-25 | Discharge: 2015-09-25 | Disposition: A | Discharge status: Home / Self Care | Payer: Medicaid Other | Attending: Emergency Medicine | Admitting: Emergency Medicine

## 2015-09-25 DIAGNOSIS — Z720 Tobacco use: Secondary | ICD-10-CM | POA: Insufficient documentation

## 2015-09-25 DIAGNOSIS — Z3202 Encounter for pregnancy test, result negative: Secondary | ICD-10-CM | POA: Diagnosis not present

## 2015-09-25 DIAGNOSIS — Z79899 Other long term (current) drug therapy: Secondary | ICD-10-CM | POA: Diagnosis not present

## 2015-09-25 DIAGNOSIS — R1013 Epigastric pain: Secondary | ICD-10-CM | POA: Diagnosis not present

## 2015-09-25 DIAGNOSIS — R109 Unspecified abdominal pain: Secondary | ICD-10-CM | POA: Diagnosis present

## 2015-09-25 LAB — CBC WITH DIFFERENTIAL/PLATELET
Basophils Absolute: 0 10*3/uL (ref 0–0.1)
Basophils Relative: 1 %
EOS ABS: 0.2 10*3/uL (ref 0–0.7)
Eosinophils Relative: 3 %
HCT: 39.8 % (ref 35.0–47.0)
HEMOGLOBIN: 12.9 g/dL (ref 12.0–16.0)
Lymphocytes Relative: 44 %
Lymphs Abs: 2.4 10*3/uL (ref 1.0–3.6)
MCH: 27.8 pg (ref 26.0–34.0)
MCHC: 32.4 g/dL (ref 32.0–36.0)
MCV: 85.9 fL (ref 80.0–100.0)
MONOS PCT: 5 %
Monocytes Absolute: 0.3 10*3/uL (ref 0.2–0.9)
NEUTROS ABS: 2.6 10*3/uL (ref 1.4–6.5)
Neutrophils Relative %: 47 %
Platelets: 184 10*3/uL (ref 150–440)
RBC: 4.64 MIL/uL (ref 3.80–5.20)
RDW: 13.9 % (ref 11.5–14.5)
WBC: 5.5 10*3/uL (ref 3.6–11.0)

## 2015-09-25 LAB — URINALYSIS COMPLETE WITH MICROSCOPIC (ARMC ONLY)
BILIRUBIN URINE: NEGATIVE
Bacteria, UA: NONE SEEN
GLUCOSE, UA: NEGATIVE mg/dL
HGB URINE DIPSTICK: NEGATIVE
Ketones, ur: NEGATIVE mg/dL
Leukocytes, UA: NEGATIVE
Nitrite: NEGATIVE
Protein, ur: NEGATIVE mg/dL
SPECIFIC GRAVITY, URINE: 1.013 (ref 1.005–1.030)
pH: 7 (ref 5.0–8.0)

## 2015-09-25 LAB — COMPREHENSIVE METABOLIC PANEL
ALK PHOS: 71 U/L (ref 38–126)
ALT: 9 U/L — ABNORMAL LOW (ref 14–54)
ANION GAP: 5 (ref 5–15)
AST: 13 U/L — ABNORMAL LOW (ref 15–41)
Albumin: 4.5 g/dL (ref 3.5–5.0)
BILIRUBIN TOTAL: 0.9 mg/dL (ref 0.3–1.2)
BUN: 8 mg/dL (ref 6–20)
CALCIUM: 9.3 mg/dL (ref 8.9–10.3)
CO2: 25 mmol/L (ref 22–32)
CREATININE: 0.78 mg/dL (ref 0.44–1.00)
Chloride: 108 mmol/L (ref 101–111)
GFR calc Af Amer: >60 mL/min (ref >60)
Glucose, Bld: 101 mg/dL — ABNORMAL HIGH (ref 65–99)
Potassium: 4.3 mmol/L (ref 3.5–5.1)
Sodium: 138 mmol/L (ref 135–145)
Total Protein: 8.3 g/dL — ABNORMAL HIGH (ref 6.5–8.1)

## 2015-09-25 LAB — LIPASE, BLOOD: Lipase: 24 U/L (ref 22–51)

## 2015-09-25 LAB — POCT PREGNANCY, URINE: PREG TEST UR: NEGATIVE

## 2015-09-25 MED ORDER — DICYCLOMINE HCL 20 MG PO TABS: 20.0000 mg | ORAL_TABLET | Freq: Three times a day (TID) | ORAL | Status: DC | PRN

## 2015-09-25 MED ORDER — RANITIDINE HCL 75 MG PO TABS: 75.0000 mg | ORAL_TABLET | Freq: Two times a day (BID) | ORAL | Status: DC

## 2015-09-25 MED ORDER — GI COCKTAIL ~~LOC~~
30.0000 mL | Freq: Once | ORAL | Status: AC
  Administered 2015-09-25: 30 mL via ORAL
  Filled 2015-09-25: qty 30

## 2015-09-25 MED ORDER — DICYCLOMINE HCL 10 MG PO CAPS
20.0000 mg | ORAL_CAPSULE | Freq: Once | ORAL | Status: AC
  Administered 2015-09-25: 20 mg via ORAL
  Filled 2015-09-25: qty 2

## 2015-09-25 NOTE — ED Notes (Signed)
Pt states having abd pain in upper abd. Denies taking anything for pain or relief. States was vomiting x3 days ago but denies NVD today. Some pain with palpation. States had similar pain with miscarriage in past. S/p c-section in August per report. Denies complications. In NAD at this time. Pending MD.

## 2015-09-25 NOTE — ED Provider Notes (Signed)
[facility] Emergency Department Provider Note  ____________________________________________  Time seen: Approximately 6:20 PM  I have reviewed the triage vital signs and the nursing notes.   HISTORY  Chief Complaint Abdominal Pain    HPI [name] is a 29 y.o. female with a history of a cesarean section 2 months ago who is presenting today with epigastric abdominal pain. She says the pain has been ongoing for 2 days and was of a burning quality but now feels like an aching. It is epigastric and nonradiating. It was associated with several episodes of nausea and vomiting several days ago. However, she has not had any episodes today. She denies any diarrhea to me. She denies any chest pain or shortness of breath. The pain is not increased with deep breathing.She is also complaining of discomfort from an Implanon that was placed on September 26 to left upper extremity. She denies any pus or swelling but just says it feels uncomfortable stress and when she moves her arm. Denies any vaginal discharge or bleeding. No pain with urination.   Past Medical History  Diagnosis Date  . Depression   . History of suicide attempt   . History of substance abuse   . History of thyroid disease     Patient Active Problem List   Diagnosis Date Noted  . Pyelonephritis affecting pregnancy 07/26/2015  . Kidney pain 07/26/2015    Past Surgical History  Procedure Laterality Date  . Cesarean section  2000, 2002, 2013    Current Outpatient Rx  Name  Route  Sig  Dispense  Refill  . Prenatal Vit-Fe Fumarate-FA (MULTIVITAMIN-PRENATAL) 27-0.8 MG TABS tablet   Oral   Take 1 tablet by mouth daily at 12 noon.           Allergies Banana; Pecan pollen; and Asa  Family History  Problem Relation Age of Onset  . Diabetes Mother     Social History Social History  Substance Use Topics  . Smoking status: Current Every Day Smoker -- 0.10 packs/day  . Smokeless tobacco:  None  . Alcohol Use: No    Review of Systems Constitutional: No fever/chills Eyes: No visual changes. ENT: No sore throat. Cardiovascular: Denies chest pain. Respiratory: Denies shortness of breath. Gastrointestinal:  No diarrhea.  No constipation. Genitourinary: Negative for dysuria. Musculoskeletal: Negative for back pain. Skin: Negative for rash. Neurological: Negative for headaches, focal weakness or numbness.  10-point ROS otherwise negative.  ____________________________________________   PHYSICAL EXAM:  VITAL SIGNS: ED Triage Vitals  Enc Vitals Group     BP 09/25/15 1453 125/71 mmHg     Pulse Rate 09/25/15 1453 67     Resp 09/25/15 1453 18     Temp 09/25/15 1453 97.9 F (36.6 C)     Temp Source 09/25/15 1453 Oral     SpO2 09/25/15 1453 100 %     Weight 09/25/15 1453 120 lb (54.432 kg)     Height 09/25/15 1453  (1.499 m)     Head Cir --      Peak Flow --      Pain Score 09/25/15 1453 10     Pain Loc --      Pain Edu? --      Excl. in GC? --     Constitutional: Alert and oriented. Well appearing and in no acute distress. Eyes: Conjunctivae are normal. PERRL. EOMI. Head: Atraumatic. Nose: No congestion/rhinnorhea. Mouth/Throat: Mucous membranes are moist.  Oropharynx non-erythematous. Neck: No stridor.  Cardiovascular: Normal rate, regular rhythm. Grossly normal heart sounds.  Good peripheral circulation. Respiratory: Normal respiratory effort.  No retractions. Lungs CTAB. Gastrointestinal: Soft and mild epigastric tenderness. There is no rebound or guarding. There is a negative Murphy sign. There is no tenderness over McBurney's point.. No distention. No abdominal bruits. No CVA tenderness. Musculoskeletal: No lower extremity tenderness nor edema.  No joint effusions. Neurologic:  Normal speech and language. No gross focal neurologic deficits are appreciated. No gait instability. Skin:  Skin is warm, dry and intact. No rash noted. Psychiatric: Mood  and affect are normal. Speech and behavior are normal.  ____________________________________________   LABS (all labs ordered are listed, but only abnormal results are displayed)  Labs Reviewed  COMPREHENSIVE METABOLIC PANEL - Abnormal; Notable for the following:    Glucose, Bld 101 (*)    Total Protein 8.3 (*)    AST 13 (*)    ALT 9 (*)    All other components within normal limits  URINALYSIS COMPLETEWITH MICROSCOPIC ([facility] ONLY) - Abnormal; Notable for the following:    Color, Urine YELLOW (*)    APPearance CLEAR (*)    Squamous Epithelial / LPF 0-5 (*)    All other components within normal limits  CBC WITH DIFFERENTIAL/PLATELET  LIPASE, BLOOD  POC URINE PREG, ED  POCT PREGNANCY, URINE   ____________________________________________  EKG   ____________________________________________  RADIOLOGY   ____________________________________________   PROCEDURES    ____________________________________________   INITIAL IMPRESSION / ASSESSMENT AND PLAN / ED COURSE  Pertinent labs & imaging results that were available during my care of the patient were reviewed by me and considered in my medical decision making (see chart for details).  ----------------------------------------- 7:06 PM on 09/25/2015 -----------------------------------------  Patient is resting comfortable at this time. Says that the GI cocktail did not provide relief. Patient without any vomiting during this hospital visit. In no acute distress.PERC negative. Possibly continued abdominal cramping from her recent what sounds like a viral illness. We'll discharge to home with Bentyl as well as Zantac. Patient has follow-up with her primary care doctor. Also discussed following up if the Implanon continues to be bothersome. We also discussed red flag symptoms for the Implanon including if the area becomes red, swollen or hot or there is pus. The patient understands the plan and is willing to comply including  the plan for return for worsening symptoms. ____________________________________________   FINAL CLINICAL IMPRESSION(S) / ED DIAGNOSES  Acute epigastric abdominal pain. Initial visit.    Myrna Blazer, MD 09/26/15 [place]

## 2015-09-25 NOTE — Discharge Instructions (Signed)

## 2015-09-25 NOTE — ED Notes (Signed)
Abdominal pain x 2 days, history of c section 2 months ago

## 2015-09-25 NOTE — ED Notes (Signed)

## 2015-11-17 ENCOUNTER — Emergency Department
Admission: EM | Admit: 2015-11-17 | Discharge: 2015-11-17 | Disposition: A | Discharge status: Home / Self Care | Payer: Medicaid Other | Attending: Emergency Medicine | Admitting: Emergency Medicine

## 2015-11-17 ENCOUNTER — Emergency Department: Payer: Medicaid Other

## 2015-11-17 ENCOUNTER — Encounter: Payer: Self-pay | Admitting: Emergency Medicine

## 2015-11-17 DIAGNOSIS — R1030 Lower abdominal pain, unspecified: Secondary | ICD-10-CM | POA: Diagnosis present

## 2015-11-17 DIAGNOSIS — N939 Abnormal uterine and vaginal bleeding, unspecified: Secondary | ICD-10-CM | POA: Diagnosis not present

## 2015-11-17 DIAGNOSIS — R1110 Vomiting, unspecified: Secondary | ICD-10-CM | POA: Insufficient documentation

## 2015-11-17 DIAGNOSIS — R197 Diarrhea, unspecified: Secondary | ICD-10-CM | POA: Diagnosis not present

## 2015-11-17 DIAGNOSIS — F17200 Nicotine dependence, unspecified, uncomplicated: Secondary | ICD-10-CM | POA: Insufficient documentation

## 2015-11-17 DIAGNOSIS — Z3202 Encounter for pregnancy test, result negative: Secondary | ICD-10-CM | POA: Diagnosis not present

## 2015-11-17 DIAGNOSIS — R319 Hematuria, unspecified: Secondary | ICD-10-CM | POA: Diagnosis not present

## 2015-11-17 DIAGNOSIS — Z7951 Long term (current) use of inhaled steroids: Secondary | ICD-10-CM | POA: Diagnosis not present

## 2015-11-17 DIAGNOSIS — Z79899 Other long term (current) drug therapy: Secondary | ICD-10-CM | POA: Insufficient documentation

## 2015-11-17 DIAGNOSIS — Z791 Long term (current) use of non-steroidal anti-inflammatories (NSAID): Secondary | ICD-10-CM | POA: Diagnosis not present

## 2015-11-17 DIAGNOSIS — R1084 Generalized abdominal pain: Secondary | ICD-10-CM

## 2015-11-17 HISTORY — DX: Disorder of thyroid, unspecified: E07.9

## 2015-11-17 LAB — CBC WITH DIFFERENTIAL/PLATELET
BASOS PCT: 1 %
Basophils Absolute: 0 10*3/uL (ref 0–0.1)
EOS ABS: 0.2 10*3/uL (ref 0–0.7)
EOS PCT: 4 %
HCT: 39.9 % (ref 35.0–47.0)
HEMOGLOBIN: 12.9 g/dL (ref 12.0–16.0)
Lymphocytes Relative: 44 %
Lymphs Abs: 2.3 10*3/uL (ref 1.0–3.6)
MCH: 27.8 pg (ref 26.0–34.0)
MCHC: 32.5 g/dL (ref 32.0–36.0)
MCV: 85.6 fL (ref 80.0–100.0)
MONO ABS: 0.3 10*3/uL (ref 0.2–0.9)
MONOS PCT: 5 %
NEUTROS PCT: 46 %
Neutro Abs: 2.5 10*3/uL (ref 1.4–6.5)
PLATELETS: 172 10*3/uL (ref 150–440)
RBC: 4.66 MIL/uL (ref 3.80–5.20)
RDW: 16.1 % — AB (ref 11.5–14.5)
WBC: 5.3 10*3/uL (ref 3.6–11.0)

## 2015-11-17 LAB — URINALYSIS COMPLETE WITH MICROSCOPIC (ARMC ONLY)
BACTERIA UA: NONE SEEN
Bilirubin Urine: NEGATIVE
Glucose, UA: NEGATIVE mg/dL
KETONES UR: NEGATIVE mg/dL
LEUKOCYTES UA: NEGATIVE
NITRITE: NEGATIVE
PROTEIN: NEGATIVE mg/dL
SPECIFIC GRAVITY, URINE: 1.021 (ref 1.005–1.030)
pH: 5 (ref 5.0–8.0)

## 2015-11-17 LAB — COMPREHENSIVE METABOLIC PANEL
ALBUMIN: 4.3 g/dL (ref 3.5–5.0)
ALT: 9 U/L — ABNORMAL LOW (ref 14–54)
ANION GAP: 8 (ref 5–15)
AST: 13 U/L — ABNORMAL LOW (ref 15–41)
Alkaline Phosphatase: 53 U/L (ref 38–126)
BUN: 12 mg/dL (ref 6–20)
CHLORIDE: 106 mmol/L (ref 101–111)
CO2: 21 mmol/L — AB (ref 22–32)
Calcium: 9 mg/dL (ref 8.9–10.3)
Creatinine, Ser: 0.59 mg/dL (ref 0.44–1.00)
GFR calc Af Amer: >60 mL/min (ref >60)
GFR calc non Af Amer: >60 mL/min (ref >60)
GLUCOSE: 101 mg/dL — AB (ref 65–99)
POTASSIUM: 3.9 mmol/L (ref 3.5–5.1)
SODIUM: 135 mmol/L (ref 135–145)
Total Bilirubin: 1.1 mg/dL (ref 0.3–1.2)
Total Protein: 7.6 g/dL (ref 6.5–8.1)

## 2015-11-17 LAB — APTT: aPTT: 29 seconds (ref 24–36)

## 2015-11-17 LAB — LIPASE, BLOOD: Lipase: 22 U/L (ref 11–51)

## 2015-11-17 LAB — POCT PREGNANCY, URINE: PREG TEST UR: NEGATIVE

## 2015-11-17 MED ORDER — ONDANSETRON 8 MG PO TBDP: 8.0000 mg | ORAL_TABLET | Freq: Three times a day (TID) | ORAL | Status: DC | PRN

## 2015-11-17 MED ORDER — KETOROLAC TROMETHAMINE 30 MG/ML IJ SOLN
30.0000 mg | Freq: Once | INTRAMUSCULAR | Status: AC
  Administered 2015-11-17: 30 mg via INTRAVENOUS
  Filled 2015-11-17: qty 1

## 2015-11-17 MED ORDER — IOHEXOL 240 MG/ML SOLN
25.0000 mL | INTRAMUSCULAR | Status: AC
  Administered 2015-11-17: 25 mL via ORAL

## 2015-11-17 MED ORDER — IOHEXOL 300 MG/ML  SOLN
100.0000 mL | Freq: Once | INTRAMUSCULAR | Status: AC | PRN
  Administered 2015-11-17: 100 mL via INTRAVENOUS

## 2015-11-17 MED ORDER — SODIUM CHLORIDE 0.9 % IV BOLUS (SEPSIS)
1000.0000 mL | Freq: Once | INTRAVENOUS | Status: AC
  Administered 2015-11-17: 1000 mL via INTRAVENOUS

## 2015-11-17 MED ORDER — NAPROXEN 500 MG PO TABS: 500.0000 mg | ORAL_TABLET | Freq: Two times a day (BID) | ORAL | Status: DC

## 2015-11-17 NOTE — ED Provider Notes (Signed)
[facility] Emergency Department Provider Note  ____________________________________________  Time seen: 9:00 AM  I have reviewed the triage vital signs and the nursing notes.   HISTORY  Chief Complaint Abdominal Pain    HPI [name] is a 29 y.o. female who complains of suprapubic pain that radiates to her back feels like a pulling sensation, constant. Worse over the last 2-3 weeks. She is 3 months postop from a C-section, and reports that she and her baby are doing well. No fever chills nausea vomiting or diarrhea. No chest pain or shortness of breath. No leg pain or swelling.     Past Medical History  Diagnosis Date  . Depression   . History of suicide attempt   . History of substance abuse   . History of thyroid disease   . Thyroid disease      Patient Active Problem List   Diagnosis Date Noted  . Pyelonephritis affecting pregnancy 07/26/2015  . Kidney pain 07/26/2015     Past Surgical History  Procedure Laterality Date  . Cesarean section  2000, 2002, 2013     Current Outpatient Rx  Name  Route  Sig  Dispense  Refill  . dicyclomine (BENTYL) 20 MG tablet   Oral   Take 1 tablet (20 mg total) by mouth 3 (three) times daily as needed for spasms.   30 tablet   0   . fluticasone (FLONASE) 50 MCG/ACT nasal spray   Each Nare   Place 1 spray into both nostrils daily.         . Prenatal Vit-Fe Fumarate-FA (MULTIVITAMIN-PRENATAL) 27-0.8 MG TABS tablet   Oral   Take 1 tablet by mouth daily.          . valACYclovir (VALTREX) 500 MG tablet   Oral   Take 500 mg by mouth daily.         . naproxen (NAPROSYN) 500 MG tablet   Oral   Take 1 tablet (500 mg total) by mouth 2 (two) times daily with a meal.   20 tablet   0   . ondansetron (ZOFRAN ODT) 8 MG disintegrating tablet   Oral   Take 1 tablet (8 mg total) by mouth every 8 (eight) hours as needed for nausea or vomiting.   20 tablet   0   . ranitidine (ZANTAC 75) 75 MG  tablet   Oral   Take 1 tablet (75 mg total) by mouth 2 (two) times daily. Patient not taking: Reported on 11/17/2015   60 tablet   0      Allergies Banana; Pecan pollen; and Asa   Family History  Problem Relation Age of Onset  . Diabetes Mother     Social History Social History  Substance Use Topics  . Smoking status: Current Every Day Smoker -- 0.50 packs/day  . Smokeless tobacco: None  . Alcohol Use: No    Review of Systems  Constitutional:   No fever or chills. No weight changes Eyes:   No blurry vision or double vision.  ENT:   No sore throat. Cardiovascular:   No chest pain. Respiratory:   No dyspnea or cough. Gastrointestinal:   Positive for abdominal pain, vomiting and diarrhea.  No BRBPR or melena. Genitourinary:   Negative for dysuria, urinary retention,or difficulty urinating. Positive hematuria yesterday Musculoskeletal:   Negative for back pain. No joint swelling or pain. Skin:   Negative for rash. Neurological:   Negative for headaches, focal weakness or numbness. Psychiatric:  No anxiety or depression.   Endocrine:  No hot/cold intolerance, changes in energy, or sleep difficulty.  10-point ROS otherwise negative.  ____________________________________________   PHYSICAL EXAM:  VITAL SIGNS: ED Triage Vitals  Enc Vitals Group     BP 11/17/15 0833 135/87 mmHg     Pulse Rate 11/17/15 0833 57     Resp 11/17/15 0833 18     Temp 11/17/15 0833 98.2 F (36.8 C)     Temp Source 11/17/15 0833 Oral     SpO2 11/17/15 0833 100 %     Weight 11/17/15 0833 120 lb (54.432 kg)     Height 11/17/15 0833  (1.499 m)     Head Cir --      Peak Flow --      Pain Score 11/17/15 0835 10     Pain Loc --      Pain Edu? --      Excl. in GC? --      Constitutional:   Alert and oriented. Well appearing and in no distress. Eyes:   No scleral icterus. No conjunctival pallor. PERRL. EOMI ENT   Head:   Normocephalic and atraumatic.   Nose:   No  congestion/rhinnorhea. No septal hematoma   Mouth/Throat:   MMM, no pharyngeal erythema. No peritonsillar mass. No uvula shift.   Neck:   No stridor. No SubQ emphysema. No meningismus. Hematological/Lymphatic/Immunilogical:   No cervical lymphadenopathy. Cardiovascular:   RRR. Normal and symmetric distal pulses are present in all extremities. No murmurs, rubs, or gallops. Respiratory:   Normal respiratory effort without tachypnea nor retractions. Breath sounds are clear and equal bilaterally. No wheezes/rales/rhonchi. Gastrointestinal:   Soft with bilateral lower quadrant tenderness, worse in the suprapubic area.. No distention. There is no CVA tenderness.  No rebound, rigidity, or guarding. Genitourinary:   deferred Musculoskeletal:   Nontender with normal range of motion in all extremities. No joint effusions.  No lower extremity tenderness.  No edema. Neurologic:   Normal speech and language.  CN 2-10 normal. Motor grossly intact. No pronator drift.  Normal gait. No gross focal neurologic deficits are appreciated.  Skin:    Skin is warm, dry and intact. No rash noted.  No petechiae, purpura, or bullae. Psychiatric:   Mood and affect are normal. Speech and behavior are normal. Patient exhibits appropriate insight and judgment.  ____________________________________________    LABS (pertinent positives/negatives) (all labs ordered are listed, but only abnormal results are displayed) Labs Reviewed  CBC WITH DIFFERENTIAL/PLATELET - Abnormal; Notable for the following:    RDW 16.1 (*)    All other components within normal limits  COMPREHENSIVE METABOLIC PANEL - Abnormal; Notable for the following:    CO2 21 (*)    Glucose, Bld 101 (*)    AST 13 (*)    ALT 9 (*)    All other components within normal limits  URINALYSIS COMPLETEWITH MICROSCOPIC ([facility] ONLY) - Abnormal; Notable for the following:    Color, Urine YELLOW (*)    APPearance CLEAR (*)    Hgb urine dipstick 3+ (*)     Squamous Epithelial / LPF 0-5 (*)    All other components within normal limits  APTT  LIPASE, BLOOD  POC URINE PREG, ED  POCT PREGNANCY, URINE   ____________________________________________   EKG  Interpreted by me Normal sinus rhythm rate of 59, normal axis and intervals, normal ST segments and T waves, large amplitude QRS complexes with associated repolarization abnormality.  ____________________________________________    RADIOLOGY  CT scan abdomen and pelvis unremarkable  ____________________________________________   PROCEDURES   ____________________________________________   INITIAL IMPRESSION / ASSESSMENT AND PLAN / ED COURSE  Pertinent labs & imaging results that were available during my care of the patient were reviewed by me and considered in my medical decision making (see chart for details).  Patient presents with hematuria with suprapubic tenderness, we'll evaluate for urinary tract infection. If this is negative we will proceed with labs and CT scan.  ----------------------------------------- 10:47 AM on 11/17/2015 -----------------------------------------  Urinalysis unremarkable except for small amount of red blood cells with microscopic hematuria. We'll proceed with labs and CT. Was informed by the nurse that the patient through a needle at the nurse after she inserted an IV. I'll advise the patient this behavior is not tolerated. This behavior will force [place] to minimize staff interaction with the patient while providing appropriate care and ensuring she does not have an emergency medical condition to avoid any further safety risks.  ----------------------------------------- 1:15 PM on 11/17/2015 -----------------------------------------  Patient well appearing no acute distress, symptoms controlled. Workup negative. On further history the patient reports that the blood is not in her urine but that she is actually having vaginal bleeding. She denies any  discharge or any suspicion whatsoever that she could have an STI. Refuses pelvic exam at this time. Her LMP was 3 weeks ago. She is not breast-feeding. I discussed with her that this may be her menses due to the diffuse lower abdominal cramping nature of the pain, she is dissatisfied with this answer. I recommended taking NSAIDs and nausea medicine and follow up with gynecology if the bleeding does not resolve. Low suspicion for TOA or torsion.   ____________________________________________   FINAL CLINICAL IMPRESSION(S) / ED DIAGNOSES  Final diagnoses:  Generalized abdominal pain  Vaginal bleeding      Sharman Cheek, MD 11/17/15 1317

## 2015-11-17 NOTE — ED Notes (Signed)
Pt discharged home after verbalizing understanding of discharge instructions; nad noted. 

## 2015-11-17 NOTE — ED Notes (Signed)
Pt returned from CT °

## 2015-11-17 NOTE — Discharge Instructions (Signed)
Abdominal Pain, Adult °Many things can cause abdominal pain. Usually, abdominal pain is not caused by a disease and will improve without treatment. It can often be observed and treated at home. Your health care provider will do a physical exam and possibly order blood tests and X-rays to help determine the seriousness of your pain. However, in many cases, more time must pass before a clear cause of the pain can be found. Before that point, your health care provider may not know if you need more testing or further treatment. °HOME CARE INSTRUCTIONS °Monitor your abdominal pain for any changes. The following actions may help to alleviate any discomfort you are experiencing: °· Only take over-the-counter or prescription medicines as directed by your health care provider. °· Do not take laxatives unless directed to do so by your health care provider. °· Try a clear liquid diet (broth, tea, or water) as directed by your health care provider. Slowly move to a bland diet as tolerated. °SEEK MEDICAL CARE IF: °· You have unexplained abdominal pain. °· You have abdominal pain associated with nausea or diarrhea. °· You have pain when you urinate or have a bowel movement. °· You experience abdominal pain that wakes you in the night. °· You have abdominal pain that is worsened or improved by eating food. °· You have abdominal pain that is worsened with eating fatty foods. °· You have a fever. °SEEK IMMEDIATE MEDICAL CARE IF: °· Your pain does not go away within 2 hours. °· You keep throwing up (vomiting). °· Your pain is felt only in portions of the abdomen, such as the right side or the left lower portion of the abdomen. °· You pass bloody or black tarry stools. °MAKE SURE YOU: °· Understand these instructions. °· Will watch your condition. °· Will get help right away if you are not doing well or get worse. °  °This information is not intended to replace advice given to you by your health care provider. Make sure you discuss  any questions you have with your health care provider. °  °Document Released: 09/20/2005 Document Revised: 09/01/2015 Document Reviewed: 08/20/2013 °Elsevier Interactive Patient Education ©2016 [employer] ° °Abnormal Uterine Bleeding °Abnormal uterine bleeding can affect women at various stages in life, including teenagers, women in their reproductive years, pregnant women, and women who have reached menopause. Several kinds of uterine bleeding are considered abnormal, including: °· Bleeding or spotting between periods.   °· Bleeding after sexual intercourse.   °· Bleeding that is heavier or more than normal.   °· Periods that last longer than usual. °· Bleeding after menopause.   °Many cases of abnormal uterine bleeding are minor and simple to treat, while others are more serious. Any type of abnormal bleeding should be evaluated by your health care provider. Treatment will depend on the cause of the bleeding. °HOME CARE INSTRUCTIONS °Monitor your condition for any changes. The following actions may help to alleviate any discomfort you are experiencing: °· Avoid the use of tampons and douches as directed by your health care provider. °· Change your pads frequently. °You should get regular pelvic exams and Pap tests. Keep all follow-up appointments for diagnostic tests as directed by your health care provider.  °SEEK MEDICAL CARE IF:  °· Your bleeding lasts more than 1 week.   °· You feel dizzy at times.   °SEEK IMMEDIATE MEDICAL CARE IF:  °· You pass out.   °· You are changing pads every 15 to 30 minutes.   °· You have abdominal pain. °· You have   a fever.   °· You become sweaty or weak.   °· You are passing large blood clots from the vagina.   °· You start to feel nauseous and vomit. °MAKE SURE YOU:  °· Understand these instructions. °· Will watch your condition. °· Will get help right away if you are not doing well or get worse. °  °This information is not intended to replace advice given to you by your health  care provider. Make sure you discuss any questions you have with your health care provider. °  °Document Released: 12/11/2005 Document Revised: 12/16/2013 Document Reviewed: 07/10/2013 °Elsevier Interactive Patient Education ©2016 [employer] ° °

## 2015-11-17 NOTE — ED Notes (Signed)
Pt from home with lower abdominal pain. States it feels like something is "pulling down." She wonders if it may have something to do with her C-Section 3 months ago. Pt denies urinary frequency or pain, but states there was some blood in the toilet when she urinated yesterday.

## 2015-11-17 NOTE — ED Notes (Signed)
Pt to ct 

## 2016-01-10 DIAGNOSIS — F1410 Cocaine abuse, uncomplicated: Secondary | ICD-10-CM | POA: Insufficient documentation

## 2016-01-10 DIAGNOSIS — S61512A Laceration without foreign body of left wrist, initial encounter: Secondary | ICD-10-CM | POA: Insufficient documentation

## 2016-01-10 DIAGNOSIS — F17200 Nicotine dependence, unspecified, uncomplicated: Secondary | ICD-10-CM | POA: Insufficient documentation

## 2016-01-10 DIAGNOSIS — Z79899 Other long term (current) drug therapy: Secondary | ICD-10-CM | POA: Insufficient documentation

## 2016-01-10 DIAGNOSIS — Y998 Other external cause status: Secondary | ICD-10-CM | POA: Insufficient documentation

## 2016-01-10 DIAGNOSIS — F1010 Alcohol abuse, uncomplicated: Secondary | ICD-10-CM | POA: Insufficient documentation

## 2016-01-10 DIAGNOSIS — Z23 Encounter for immunization: Secondary | ICD-10-CM | POA: Insufficient documentation

## 2016-01-10 DIAGNOSIS — X788XXA Intentional self-harm by other sharp object, initial encounter: Secondary | ICD-10-CM | POA: Insufficient documentation

## 2016-01-10 DIAGNOSIS — Y9389 Activity, other specified: Secondary | ICD-10-CM | POA: Insufficient documentation

## 2016-01-10 DIAGNOSIS — F329 Major depressive disorder, single episode, unspecified: Secondary | ICD-10-CM | POA: Insufficient documentation

## 2016-01-10 DIAGNOSIS — Z791 Long term (current) use of non-steroidal anti-inflammatories (NSAID): Secondary | ICD-10-CM | POA: Insufficient documentation

## 2016-01-10 DIAGNOSIS — Z7951 Long term (current) use of inhaled steroids: Secondary | ICD-10-CM | POA: Insufficient documentation

## 2016-01-10 DIAGNOSIS — Y9289 Other specified places as the place of occurrence of the external cause: Secondary | ICD-10-CM | POA: Insufficient documentation

## 2016-01-10 NOTE — ED Notes (Signed)
Pt has lac to left wrist that is self inflicted with razor, hx of suicide attempt in the past.

## 2016-01-11 ENCOUNTER — Inpatient Hospital Stay
Admit: 2016-01-11 | Discharge: 2016-01-14 | DRG: 885 | Disposition: A | Discharge status: Home / Self Care | Payer: No Typology Code available for payment source | Source: Ambulatory Visit | Attending: Psychiatry | Admitting: Psychiatry

## 2016-01-11 ENCOUNTER — Emergency Department
Admission: EM | Admit: 2016-01-11 | Discharge: 2016-01-11 | Disposition: A | Discharge status: Other Acute Inpatient Hospital | Payer: No Typology Code available for payment source | Attending: Emergency Medicine | Admitting: Emergency Medicine

## 2016-01-11 DIAGNOSIS — Z833 Family history of diabetes mellitus: Secondary | ICD-10-CM | POA: Diagnosis not present

## 2016-01-11 DIAGNOSIS — G4700 Insomnia, unspecified: Secondary | ICD-10-CM | POA: Diagnosis present

## 2016-01-11 DIAGNOSIS — F1490 Cocaine use, unspecified, uncomplicated: Secondary | ICD-10-CM | POA: Diagnosis present

## 2016-01-11 DIAGNOSIS — F1020 Alcohol dependence, uncomplicated: Secondary | ICD-10-CM

## 2016-01-11 DIAGNOSIS — F32A Depression, unspecified: Secondary | ICD-10-CM

## 2016-01-11 DIAGNOSIS — X789XXA Intentional self-harm by unspecified sharp object, initial encounter: Secondary | ICD-10-CM | POA: Diagnosis present

## 2016-01-11 DIAGNOSIS — Z7289 Other problems related to lifestyle: Secondary | ICD-10-CM

## 2016-01-11 DIAGNOSIS — F1290 Cannabis use, unspecified, uncomplicated: Secondary | ICD-10-CM | POA: Diagnosis present

## 2016-01-11 DIAGNOSIS — F332 Major depressive disorder, recurrent severe without psychotic features: Principal | ICD-10-CM | POA: Diagnosis present

## 2016-01-11 DIAGNOSIS — F1010 Alcohol abuse, uncomplicated: Secondary | ICD-10-CM

## 2016-01-11 DIAGNOSIS — F1410 Cocaine abuse, uncomplicated: Secondary | ICD-10-CM

## 2016-01-11 DIAGNOSIS — S61519A Laceration without foreign body of unspecified wrist, initial encounter: Secondary | ICD-10-CM

## 2016-01-11 DIAGNOSIS — Y929 Unspecified place or not applicable: Secondary | ICD-10-CM

## 2016-01-11 DIAGNOSIS — F10929 Alcohol use, unspecified with intoxication, unspecified: Secondary | ICD-10-CM | POA: Diagnosis present

## 2016-01-11 DIAGNOSIS — X788XXA Intentional self-harm by other sharp object, initial encounter: Secondary | ICD-10-CM | POA: Diagnosis present

## 2016-01-11 DIAGNOSIS — F329 Major depressive disorder, single episode, unspecified: Secondary | ICD-10-CM

## 2016-01-11 DIAGNOSIS — S61512A Laceration without foreign body of left wrist, initial encounter: Secondary | ICD-10-CM | POA: Diagnosis present

## 2016-01-11 DIAGNOSIS — IMO0002 Reserved for concepts with insufficient information to code with codable children: Secondary | ICD-10-CM

## 2016-01-11 DIAGNOSIS — E079 Disorder of thyroid, unspecified: Secondary | ICD-10-CM | POA: Diagnosis present

## 2016-01-11 DIAGNOSIS — F1590 Other stimulant use, unspecified, uncomplicated: Secondary | ICD-10-CM

## 2016-01-11 DIAGNOSIS — Z720 Tobacco use: Secondary | ICD-10-CM

## 2016-01-11 DIAGNOSIS — F17200 Nicotine dependence, unspecified, uncomplicated: Secondary | ICD-10-CM

## 2016-01-11 LAB — CBC WITH DIFFERENTIAL/PLATELET
BASOS ABS: 0.1 10*3/uL (ref 0–0.1)
Basophils Relative: 1 %
EOS PCT: 5 %
Eosinophils Absolute: 0.4 10*3/uL (ref 0–0.7)
HEMATOCRIT: 39.8 % (ref 35.0–47.0)
HEMOGLOBIN: 13 g/dL (ref 12.0–16.0)
LYMPHS PCT: 52 %
Lymphs Abs: 4.9 10*3/uL — ABNORMAL HIGH (ref 1.0–3.6)
MCH: 29.4 pg (ref 26.0–34.0)
MCHC: 32.7 g/dL (ref 32.0–36.0)
MCV: 89.8 fL (ref 80.0–100.0)
Monocytes Absolute: 0.4 10*3/uL (ref 0.2–0.9)
Monocytes Relative: 5 %
NEUTROS ABS: 3.4 10*3/uL (ref 1.4–6.5)
Neutrophils Relative %: 37 %
PLATELETS: 197 10*3/uL (ref 150–440)
RBC: 4.43 MIL/uL (ref 3.80–5.20)
RDW: 13.9 % (ref 11.5–14.5)
WBC: 9.2 10*3/uL (ref 3.6–11.0)

## 2016-01-11 LAB — URINALYSIS COMPLETE WITH MICROSCOPIC (ARMC ONLY)
Bacteria, UA: NONE SEEN
Bilirubin Urine: NEGATIVE
Glucose, UA: NEGATIVE mg/dL
KETONES UR: NEGATIVE mg/dL
LEUKOCYTES UA: NEGATIVE
Nitrite: NEGATIVE
PH: 6 (ref 5.0–8.0)
Protein, ur: NEGATIVE mg/dL
SPECIFIC GRAVITY, URINE: 1.003 — AB (ref 1.005–1.030)

## 2016-01-11 LAB — COMPREHENSIVE METABOLIC PANEL
ALBUMIN: 4.4 g/dL (ref 3.5–5.0)
ALT: 10 U/L — AB (ref 14–54)
AST: 14 U/L — AB (ref 15–41)
Alkaline Phosphatase: 50 U/L (ref 38–126)
Anion gap: 12 (ref 5–15)
BILIRUBIN TOTAL: 1.2 mg/dL (ref 0.3–1.2)
BUN: 9 mg/dL (ref 6–20)
CHLORIDE: 106 mmol/L (ref 101–111)
CO2: 16 mmol/L — ABNORMAL LOW (ref 22–32)
CREATININE: 0.76 mg/dL (ref 0.44–1.00)
Calcium: 8.5 mg/dL — ABNORMAL LOW (ref 8.9–10.3)
GFR calc Af Amer: >60 mL/min (ref >60)
GLUCOSE: 82 mg/dL (ref 65–99)
POTASSIUM: 3.6 mmol/L (ref 3.5–5.1)
Sodium: 134 mmol/L — ABNORMAL LOW (ref 135–145)
Total Protein: 7.6 g/dL (ref 6.5–8.1)

## 2016-01-11 LAB — URINE DRUG SCREEN, QUALITATIVE (ARMC ONLY)
AMPHETAMINES, UR SCREEN: NOT DETECTED
BARBITURATES, UR SCREEN: NOT DETECTED
Benzodiazepine, Ur Scrn: NOT DETECTED
COCAINE METABOLITE, UR ~~LOC~~: POSITIVE — AB
Cannabinoid 50 Ng, Ur ~~LOC~~: NOT DETECTED
MDMA (Ecstasy)Ur Screen: NOT DETECTED
METHADONE SCREEN, URINE: NOT DETECTED
OPIATE, UR SCREEN: NOT DETECTED
Phencyclidine (PCP) Ur S: NOT DETECTED
Tricyclic, Ur Screen: NOT DETECTED

## 2016-01-11 LAB — ETHANOL: Alcohol, Ethyl (B): 112 mg/dL — ABNORMAL HIGH (ref <5)

## 2016-01-11 LAB — PREGNANCY, URINE: Preg Test, Ur: NEGATIVE

## 2016-01-11 MED ORDER — CITALOPRAM HYDROBROMIDE 20 MG PO TABS
20.0000 mg | ORAL_TABLET | Freq: Every day | ORAL | Status: DC
  Filled 2016-01-11: qty 1

## 2016-01-11 MED ORDER — HALOPERIDOL LACTATE 5 MG/ML IJ SOLN
INTRAMUSCULAR | Status: AC
  Administered 2016-01-11: 5 mg via INTRAMUSCULAR
  Filled 2016-01-11: qty 1

## 2016-01-11 MED ORDER — TETANUS-DIPHTH-ACELL PERTUSSIS 5-2.5-18.5 LF-MCG/0.5 IM SUSP
0.5000 mL | Freq: Once | INTRAMUSCULAR | Status: AC
  Administered 2016-01-11: 0.5 mL via INTRAMUSCULAR
  Filled 2016-01-11: qty 0.5

## 2016-01-11 MED ORDER — DIPHENHYDRAMINE HCL 50 MG/ML IJ SOLN
INTRAMUSCULAR | Status: AC
  Administered 2016-01-11: 25 mg via INTRAMUSCULAR
  Filled 2016-01-11: qty 1

## 2016-01-11 MED ORDER — LORAZEPAM 2 MG/ML IJ SOLN
2.0000 mg | Freq: Once | INTRAMUSCULAR | Status: AC
  Administered 2016-01-11: 2 mg via INTRAMUSCULAR
  Filled 2016-01-11: qty 1

## 2016-01-11 MED ORDER — TRAZODONE HCL 100 MG PO TABS
100.0000 mg | ORAL_TABLET | Freq: Every evening | ORAL | Status: DC | PRN
  Filled 2016-01-11: qty 1

## 2016-01-11 MED ORDER — DIPHENHYDRAMINE HCL 50 MG/ML IJ SOLN
25.0000 mg | Freq: Once | INTRAMUSCULAR | Status: AC
  Administered 2016-01-11: 25 mg via INTRAMUSCULAR

## 2016-01-11 MED ORDER — HALOPERIDOL LACTATE 5 MG/ML IJ SOLN
5.0000 mg | Freq: Once | INTRAMUSCULAR | Status: AC
  Administered 2016-01-11: 5 mg via INTRAMUSCULAR

## 2016-01-11 NOTE — [facility] Counselor (Signed)
Per Dr. Clapacs, patient meets criteria [facility].  Pt assigned to bed 306A, attending MD will be Dr. Ardyth Harps.

## 2016-01-11 NOTE — ED Notes (Signed)
Patient resting comfortably in room. No complaints or concerns voiced. No distress or abnormal behavior noted. Will continue to monitor, Q 15 minute rounds continue.  

## 2016-01-11 NOTE — ED Notes (Signed)
Lunch provided.

## 2016-01-11 NOTE — ED Notes (Signed)
Stretcher placed back in patients room.

## 2016-01-11 NOTE — ED Notes (Signed)
Dr. Dolores Frame IVC'ed patient. Dr. Dolores Frame instructed staff to give tdap and ativan injection. Police officers at bedside. Patient cooperative and tolerated well.

## 2016-01-11 NOTE — ED Notes (Signed)
Pt. Noted sleeping in room. No complaints or concerns voiced. No distress or abnormal behavior noted. Will continue to monitor with security cameras. Q 15 minute rounds continue. 

## 2016-01-11 NOTE — ED Notes (Signed)
Patient ambulatory to bathroom. Patient urinated all over bathroom room and all over paper scrubs. Patient walks out of bathroom with no pants on. Patient kicks soiled pants and police officer and states, "Why do you have to be such an asshole?". Descalation technique used to calm patient down and walk her back to her room. Clean paper scrub pants provided. House keeping called to clean bathroom.

## 2016-01-11 NOTE — ED Notes (Addendum)
Pt. Noted in room. No complaints or concerns voiced. No distress or abnormal behavior noted. Will continue to monitor with security cameras. Q 15 minute rounds continue. 

## 2016-01-11 NOTE — ED Notes (Signed)
Pt. Noted in room. No complaints or concerns voiced. No distress or abnormal behavior noted. Will continue to monitor with security cameras. Q 15 minute rounds continue. 

## 2016-01-11 NOTE — ED Notes (Signed)
Patient is tearful and cooperative at this time.

## 2016-01-11 NOTE — ED Provider Notes (Signed)
[facility] Emergency Department Provider Note  ____________________________________________  Time seen: Approximately 12:29 AM  I have reviewed the triage vital signs and the nursing notes.   HISTORY  Chief Complaint Suicide Attempt    HPI [name] is a 30 y.o. female who presents to the ED from home with a chief complaint of self-injurious behavior. Patient has a history of depression who cut herself with a clean razor this evening. Denies active HI/AH/VH. Tetanus shot is not up-to-date. Voices no medical complaints.   Past Medical History  Diagnosis Date  . Depression   . History of suicide attempt   . History of substance abuse   . History of thyroid disease   . Thyroid disease     Patient Active Problem List   Diagnosis Date Noted  . Pyelonephritis affecting pregnancy 07/26/2015  . Kidney pain 07/26/2015    Past Surgical History  Procedure Laterality Date  . Cesarean section  2000, 2002, 2013    Current Outpatient Rx  Name  Route  Sig  Dispense  Refill  . dicyclomine (BENTYL) 20 MG tablet   Oral   Take 1 tablet (20 mg total) by mouth 3 (three) times daily as needed for spasms.   30 tablet   0   . fluticasone (FLONASE) 50 MCG/ACT nasal spray   Each Nare   Place 1 spray into both nostrils daily.         . naproxen (NAPROSYN) 500 MG tablet   Oral   Take 1 tablet (500 mg total) by mouth 2 (two) times daily with a meal.   20 tablet   0   . ondansetron (ZOFRAN ODT) 8 MG disintegrating tablet   Oral   Take 1 tablet (8 mg total) by mouth every 8 (eight) hours as needed for nausea or vomiting.   20 tablet   0   . Prenatal Vit-Fe Fumarate-FA (MULTIVITAMIN-PRENATAL) 27-0.8 MG TABS tablet   Oral   Take 1 tablet by mouth daily.          . ranitidine (ZANTAC 75) 75 MG tablet   Oral   Take 1 tablet (75 mg total) by mouth 2 (two) times daily. Patient not taking: Reported on 11/17/2015   60 tablet   0   . valACYclovir  (VALTREX) 500 MG tablet   Oral   Take 500 mg by mouth daily.           Allergies Banana; Pecan pollen; and Asa  Family History  Problem Relation Age of Onset  . Diabetes Mother     Social History Social History  Substance Use Topics  . Smoking status: Current Every Day Smoker -- 0.50 packs/day  . Smokeless tobacco: Not on file  . Alcohol Use: No    Review of Systems Constitutional: No fever/chills Eyes: No visual changes. ENT: No sore throat. Cardiovascular: Denies chest pain. Respiratory: Denies shortness of breath. Gastrointestinal: No abdominal pain.  No nausea, no vomiting.  No diarrhea.  No constipation. Genitourinary: Negative for dysuria. Musculoskeletal: Positive for left wrist laceration. Negative for back pain. Skin: Negative for rash. Neurological: Negative for headaches, focal weakness or numbness. Psychiatric:Positive for depression.  10-point ROS otherwise negative.  ____________________________________________   PHYSICAL EXAM:  VITAL SIGNS: ED Triage Vitals  Enc Vitals Group     BP 01/10/16 2355 154/105 mmHg     Pulse Rate 01/10/16 2355 76     Resp 01/10/16 2355 18     Temp 01/10/16 2355 98.1 F (  36.7 C)     Temp Source 01/10/16 2355 Oral     SpO2 01/10/16 2355 97 %     Weight 01/10/16 2355 121 lb (54.885 kg)     Height 01/10/16 2355  (1.499 m)     Head Cir --      Peak Flow --      Pain Score --      Pain Loc --      Pain Edu? --      Excl. in GC? --     Constitutional: Alert and oriented. Well appearing and in mild acute distress. Tearful. Eyes: Conjunctivae are normal. PERRL. EOMI. Head: Atraumatic. Nose: No congestion/rhinnorhea. Mouth/Throat: Mucous membranes are moist.  Oropharynx non-erythematous. Neck: No stridor.   Cardiovascular: Normal rate, regular rhythm. Grossly normal heart sounds.  Good peripheral circulation. Respiratory: Normal respiratory effort.  No retractions. Lungs CTAB. Gastrointestinal: Soft and  nontender. No distention. No abdominal bruits. No CVA tenderness. Musculoskeletal: Approximately 6 cm linear, superficial, well approximated, nonbleeding laceration to left volar wrist. No tendon injury visualized. Full range of motion without pain. 2+ radial pulses. No lower extremity tenderness nor edema.  No joint effusions. Neurologic:  Normal speech and language. No gross focal neurologic deficits are appreciated. No gait instability. Skin:  Skin is warm, dry and intact. No rash noted. Psychiatric: Mood and affect are tearful. Speech and behavior are normal.  ____________________________________________   LABS (all labs ordered are listed, but only abnormal results are displayed)  Labs Reviewed  COMPREHENSIVE METABOLIC PANEL - Abnormal; Notable for the following:    Sodium 134 (*)    CO2 16 (*)    Calcium 8.5 (*)    AST 14 (*)    ALT 10 (*)    All other components within normal limits  ETHANOL - Abnormal; Notable for the following:    Alcohol, Ethyl (B) 112 (*)    All other components within normal limits  CBC WITH DIFFERENTIAL/PLATELET - Abnormal; Notable for the following:    Lymphs Abs 4.9 (*)    All other components within normal limits  URINALYSIS COMPLETEWITH MICROSCOPIC ([facility] ONLY) - Abnormal; Notable for the following:    Color, Urine YELLOW (*)    APPearance CLEAR (*)    Specific Gravity, Urine 1.003 (*)    Hgb urine dipstick 3+ (*)    Squamous Epithelial / LPF 0-5 (*)    All other components within normal limits  URINE DRUG SCREEN, QUALITATIVE ([facility] ONLY) - Abnormal; Notable for the following:    Cocaine Metabolite,Ur [place] POSITIVE (*)    All other components within normal limits  PREGNANCY, URINE   ____________________________________________  EKG  None ____________________________________________  RADIOLOGY  None ____________________________________________   PROCEDURES  Procedure(s) performed: None  Critical Care performed:  No  ____________________________________________   INITIAL IMPRESSION / ASSESSMENT AND PLAN / ED COURSE  Pertinent labs & imaging results that were available during my care of the patient were reviewed by me and considered in my medical decision making (see chart for details).  30 year old female with a history of depression who presents with self-injurious behavior. Will update tetanus. Wound cleaned and dressed by nursing. Will check screening lab work and consult TTS for evaluation.  ----------------------------------------- 1:06 AM on 01/11/2016 -----------------------------------------  Patient adamantly refuses tetanus shot. Requires IM sedation as she is ripping the room apart and unable to be redirected.  ----------------------------------------- 3:30 AM on 01/11/2016 -----------------------------------------  Patient sleeping in no acute distress.  ----------------------------------------- 6:33 AM on 01/11/2016 -----------------------------------------  Patient sleeping in no acute distress. Laboratory and urinalysis results notable for elevated alcohol level and cocaine metabolites. She will remain under IVC pending psychiatry consult this morning. She may be moved to the [facility] after breakfast. ____________________________________________   FINAL CLINICAL IMPRESSION(S) / ED DIAGNOSES  Final diagnoses:  Depression  Self-inflicted injury  Laceration  Cocaine abuse  Alcohol abuse      Irean Hong, MD 01/11/16 [phone]

## 2016-01-11 NOTE — ED Notes (Signed)
New dressing on left wrist laceration is clean and dry.  Patient comfortable and sleeping. 

## 2016-01-11 NOTE — ED Notes (Signed)
Dr. Dolores Frame made aware that patient is refusing tdap injection.

## 2016-01-11 NOTE — ED Notes (Signed)
New dressing on left wrist laceration is clean and dry.

## 2016-01-11 NOTE — Consult Note (Signed)
[place] [facility] Consult   Reason for Consult:  Consult for 30 year old woman who came into the emergency room last night after lacerating her own wrist Referring Physician:  Clearnce Hasten Patient Identification: [name] MRN:  [id number] Principal Diagnosis: Severe recurrent major depression without psychotic features [facility]) Diagnosis:   Patient Active Problem List   Diagnosis Date Noted  . Suicidal ideation [R45.851] 01/11/2016  . Self-inflicted laceration of wrist [S61.519A] 01/11/2016  . Severe recurrent major depression without psychotic features ([place]) [F33.2] 01/11/2016  . Alcohol abuse [F10.10] 01/11/2016  . Cocaine abuse [F14.10] 01/11/2016  . Pyelonephritis affecting pregnancy [O26.899, N12] 07/26/2015  . Kidney pain [N23] 07/26/2015    Total Time spent with patient: 1 hour  Subjective:   [name] is a 30 y.o. female patient admitted with "I was just really sad".  HPI:  Patient interviewed. 85 assistant interviewed patient extensively. Reviewed case with her. Labs reviewed chart reviewed. 30 year old woman with a history of depression came into the emergency room last night having lacerated her wrist. Apparently she was pretty agitated and uncooperative at the time. Patient states that she impulsively cut her wrist because she was feeling so overwhelmed by sadness. Has chronic low mood and depression. She says that she was not actually trying to kill her self but that nothing was stopping the depression pain. Admits that she was also drinking last night and had a blood alcohol level over 100 on presentation. Drug screen positive for cocaine but she minimizes her substance abuse issues. Chronic low mood and not currently involved in any outpatient psychiatric treatment. She was not able to identify a specific stressor that brought on her self mutilation.  Social history: Patient lives alone. She has had more than one child but does not have custody of any  of them. Family appear to be concerned about her but she is showing little interest to talk with them right now.  Medical history: Lacerated left wrist has been bandaged and dressed by the emergency room staff. Has a history of urinary tract infections has had herpes in the past. No ongoing medical problems otherwise.  Substance abuse history: History of recurrent abuse of cocaine and alcohol. Patient minimizes the degree to which alcohol is a problem insisting that it's more of a sporadic thing. Doesn't seem to of been involved in outpatient substance abuse treatment.  Past Psychiatric History: Patient has had prior episodes of depression and presentation to the emergency room. Prior self-inflicted cuts. Has been on medication in the past. She has trouble remembering all the treatments that she has had. Doesn't appear that she's actually had inpatient psychiatric treatment.  Risk to Self:   Risk to Others:   Prior Inpatient Therapy:   Prior Outpatient Therapy:    Past Medical History:  Past Medical History  Diagnosis Date  . Depression   . History of suicide attempt   . History of substance abuse   . History of thyroid disease   . Thyroid disease     Past Surgical History  Procedure Laterality Date  . Cesarean section  2000, 2002, 2013   Family History:  Family History  Problem Relation Age of Onset  . Diabetes Mother    Family Psychiatric  History: Patient denies knowing of any family history of mental health or substance abuse issues Social History:  History  Alcohol Use No     History  Drug Use No    Social History   Social History  . Marital Status:  Single    Spouse Name: N/A  . Number of Children: N/A  . Years of Education: N/A   Social History Main Topics  . Smoking status: Current Every Day Smoker -- 0.50 packs/day  . Smokeless tobacco: Not on file  . Alcohol Use: No  . Drug Use: No  . Sexual Activity: No   Other Topics Concern  . Not on file   Social  History Narrative   Additional Social History:                          Allergies:   Allergies  Allergen Reactions  . Banana Swelling    Pt allergic to bananas. Pt states throat swells  . Pecan Pollen Swelling    Pt states she is allergic to pecans. Pt states her throat swells  . Asa [Aspirin] Swelling    Labs:  Results for orders placed or performed during the hospital encounter of 01/11/16 (from the past 48 hour(s))  Urinalysis complete, with microscopic ([facility] only)     Status: Abnormal   Collection Time: 01/11/16 12:26 AM  Result Value Ref Range   Color, Urine YELLOW (A) YELLOW   APPearance CLEAR (A) CLEAR   Glucose, UA NEGATIVE NEGATIVE mg/dL   Bilirubin Urine NEGATIVE NEGATIVE   Ketones, ur NEGATIVE NEGATIVE mg/dL   Specific Gravity, Urine 1.003 (L) 1.005 - 1.030   Hgb urine dipstick 3+ (A) NEGATIVE   pH 6.0 5.0 - 8.0   Protein, ur NEGATIVE NEGATIVE mg/dL   Nitrite NEGATIVE NEGATIVE   Leukocytes, UA NEGATIVE NEGATIVE   RBC / HPF 6-30 0 - 5 RBC/hpf   WBC, UA 0-5 0 - 5 WBC/hpf   Bacteria, UA NONE SEEN NONE SEEN   Squamous Epithelial / LPF 0-5 (A) NONE SEEN  Pregnancy, urine     Status: None   Collection Time: 01/11/16 12:26 AM  Result Value Ref Range   Preg Test, Ur NEGATIVE NEGATIVE  Urine Drug Screen, Qualitative ([facility] only)     Status: Abnormal   Collection Time: 01/11/16 12:26 AM  Result Value Ref Range   Tricyclic, Ur Screen NONE DETECTED NONE DETECTED   Amphetamines, Ur Screen NONE DETECTED NONE DETECTED   MDMA (Ecstasy)Ur Screen NONE DETECTED NONE DETECTED   Cocaine Metabolite,Ur [place] POSITIVE (A) NONE DETECTED   Opiate, Ur Screen NONE DETECTED NONE DETECTED   Phencyclidine (PCP) Ur S NONE DETECTED NONE DETECTED   Cannabinoid 50 Ng, Ur [place] NONE DETECTED NONE DETECTED   Barbiturates, Ur Screen NONE DETECTED NONE DETECTED   Benzodiazepine, Ur Scrn NONE DETECTED NONE DETECTED   Methadone Scn, Ur NONE DETECTED NONE DETECTED    Comment: (NOTE) 184   Tricyclics, urine               Cutoff 1000 ng/mL 200  Amphetamines, urine             Cutoff 1000 ng/mL 300  MDMA (Ecstasy), urine           Cutoff 500 ng/mL 400  Cocaine Metabolite, urine       Cutoff 300 ng/mL 500  Opiate, urine                   Cutoff 300 ng/mL 600  Phencyclidine (PCP), urine      Cutoff 25 ng/mL 700  Cannabinoid, urine              Cutoff 50 ng/mL 800  Barbiturates, urine  Cutoff 200 ng/mL 900  Benzodiazepine, urine           Cutoff 200 ng/mL 1000 Methadone, urine                Cutoff 300 ng/mL 1100 1200 The urine drug screen provides only a preliminary, unconfirmed 1300 analytical test result and should not be used for non-medical 1400 purposes. Clinical consideration and professional judgment should 1500 be applied to any positive drug screen result due to possible 1600 interfering substances. A more specific alternate chemical method 1700 must be used in order to obtain a confirmed analytical result.  1800 Gas chromato graphy / mass spectrometry (GC/MS) is the preferred 1900 confirmatory method.   Comprehensive metabolic panel     Status: Abnormal   Collection Time: 01/11/16  1:41 AM  Result Value Ref Range   Sodium 134 (L) 135 - 145 mmol/L   Potassium 3.6 3.5 - 5.1 mmol/L   Chloride 106 101 - 111 mmol/L   CO2 16 (L) 22 - 32 mmol/L   Glucose, Bld 82 65 - 99 mg/dL   BUN 9 6 - 20 mg/dL   Creatinine, Ser 0.76 0.44 - 1.00 mg/dL   Calcium 8.5 (L) 8.9 - 10.3 mg/dL   Total Protein 7.6 6.5 - 8.1 g/dL   Albumin 4.4 3.5 - 5.0 g/dL   AST 14 (L) 15 - 41 U/L   ALT 10 (L) 14 - 54 U/L   Alkaline Phosphatase 50 38 - 126 U/L   Total Bilirubin 1.2 0.3 - 1.2 mg/dL   GFR calc non Af Amer >60 >60 mL/min   GFR calc Af Amer >60 >60 mL/min    Comment: (NOTE) The eGFR has been calculated using the CKD EPI equation. This calculation has not been validated in all clinical situations. eGFR's persistently <60 mL/min signify possible Chronic Kidney Disease.     Anion gap 12 5 - 15  Ethanol     Status: Abnormal   Collection Time: 01/11/16  1:41 AM  Result Value Ref Range   Alcohol, Ethyl (B) 112 (H) <5 mg/dL    Comment:        LOWEST DETECTABLE LIMIT FOR SERUM ALCOHOL IS 5 mg/dL FOR MEDICAL PURPOSES ONLY   CBC with Diff     Status: Abnormal   Collection Time: 01/11/16  1:41 AM  Result Value Ref Range   WBC 9.2 3.6 - 11.0 K/uL   RBC 4.43 3.80 - 5.20 MIL/uL   Hemoglobin 13.0 12.0 - 16.0 g/dL   HCT 39.8 35.0 - 47.0 %   MCV 89.8 80.0 - 100.0 fL   MCH 29.4 26.0 - 34.0 pg   MCHC 32.7 32.0 - 36.0 g/dL   RDW 13.9 11.5 - 14.5 %   Platelets 197 150 - 440 K/uL   Neutrophils Relative % 37 %   Neutro Abs 3.4 1.4 - 6.5 K/uL   Lymphocytes Relative 52 %   Lymphs Abs 4.9 (H) 1.0 - 3.6 K/uL   Monocytes Relative 5 %   Monocytes Absolute 0.4 0.2 - 0.9 K/uL   Eosinophils Relative 5 %   Eosinophils Absolute 0.4 0 - 0.7 K/uL   Basophils Relative 1 %   Basophils Absolute 0.1 0 - 0.1 K/uL    No current facility-administered medications for this encounter.   Current Outpatient Prescriptions  Medication Sig Dispense Refill  . dicyclomine (BENTYL) 20 MG tablet Take 1 tablet (20 mg total) by mouth 3 (three) times daily as needed for spasms. 30 tablet  0  . fluticasone (FLONASE) 50 MCG/ACT nasal spray Place 1 spray into both nostrils daily.    . naproxen (NAPROSYN) 500 MG tablet Take 1 tablet (500 mg total) by mouth 2 (two) times daily with a meal. 20 tablet 0  . ondansetron (ZOFRAN ODT) 8 MG disintegrating tablet Take 1 tablet (8 mg total) by mouth every 8 (eight) hours as needed for nausea or vomiting. 20 tablet 0  . Prenatal Vit-Fe Fumarate-FA (MULTIVITAMIN-PRENATAL) 27-0.8 MG TABS tablet Take 1 tablet by mouth daily.     . ranitidine (ZANTAC 75) 75 MG tablet Take 1 tablet (75 mg total) by mouth 2 (two) times daily. (Patient not taking: Reported on 11/17/2015) 60 tablet 0  . valACYclovir (VALTREX) 500 MG tablet Take 500 mg by mouth daily.       Musculoskeletal: Strength & Muscle Tone: within normal limits Gait & Station: normal Patient leans: N/A  Psychiatric Specialty Exam: Review of Systems  Constitutional: Negative.   HENT: Negative.   Eyes: Negative.   Respiratory: Negative.   Cardiovascular: Negative.   Gastrointestinal: Negative.   Musculoskeletal: Negative.   Skin: Negative.   Neurological: Negative.   Psychiatric/Behavioral: Positive for depression and substance abuse. Negative for suicidal ideas, hallucinations and memory loss. The patient is nervous/anxious and has insomnia.     Blood pressure 119/58, pulse 70, temperature 98.4 F (36.9 C), temperature source Oral, resp. rate 18, height _0  (1.499 m), weight 54.885 kg (121 lb), last menstrual period 11/30/2015, SpO2 100 %.Body mass index is 24.43 kg/(m^2).  General Appearance: Disheveled  Eye Contact::  Minimal  Speech:  Slow  Volume:  Decreased  Mood:  Irritable  Affect:  Restricted  Thought Process:  Linear  Orientation:  Full (Time, Place, and Person)  Thought Content:  Negative  Suicidal Thoughts:  Yes.  without intent/plan  Homicidal Thoughts:  No  Memory:  Immediate;   Fair Recent;   Fair Remote;   Fair  Judgement:  Impaired  Insight:  Shallow  Psychomotor Activity:  Decreased  Concentration:  Poor  Recall:  [employer] of Knowledge:Fair  Language: Fair  Akathisia:  No  Handed:  Right  AIMS (if indicated):     Assets:  Housing Physical Health Resilience  ADL's:  Intact  Cognition: WNL  Sleep:      Treatment Plan Summary: Daily contact with patient to assess and evaluate symptoms and progress in treatment, Medication management and Plan 30 year old woman with a history of depression and self injury and substance abuse presented last night emotionally overwhelmed and distraught having cut herself on the wrist. She has remained minimally cooperative through the day today. Eventually she did open up to one member of the treatment team.  She claims that she was not actually trying to kill her self but she cut herself pretty bad and continues to express sadness and feeling very down. Not receiving any outpatient treatment. Patient appears to still be a danger to herself. She is agreeable to a plan to admit her to the hospital. We will start back with a antidepressant medicine for depression. Close precautions for suicidal ideation. Check the wrist to make sure that it doesn't need further treatment. Patient will receive counseling about alcohol and cocaine abuse. Does not appear likely to need specific medical detox.  Disposition: Recommend psychiatric Inpatient admission when medically cleared. Supportive therapy provided about ongoing stressors.  Charmelle Soh 01/11/2016 3:48 PM

## 2016-01-11 NOTE — ED Notes (Signed)
Report received from [employer]. Pt. Alert and oriented in no distress denies SI, HI, AVH and pain.  Pt. Instructed to come to me with problems or concerns.Will continue to monitor for safety via security cameras and Q 15 minute checks.

## 2016-01-11 NOTE — ED Notes (Signed)
Patient took off wrist dressing. Wrap taken from patient. Bleeding controlled at this time.

## 2016-01-11 NOTE — ED Notes (Signed)
Patient tearful upon assessment. Patient hesitant when answering questions. When asked about SI patient states, "Just look at my wrist". When asked how long patient has been feeling this way she states, "my whole life".

## 2016-01-11 NOTE — ED Notes (Signed)
Pt awake and alert but still seems sedated , she was asked if she wanted a phone call from her sister or a visit from her mother , she refuses both she states she just wants to sleep, the dressing on her left wrist is dry and intact. I asked her if she felt up to discussing the events of last night and she declines to talk at this time and says i just want to rest. She appears in no acute distress

## 2016-01-11 NOTE — ED Notes (Signed)
Pt continues to be sleepy but arouses easily pt is aware she is being adm, unit notified of her admission and will go after shift change . Pt agreeable with plan she did eat a better dinner than lunch ,

## 2016-01-11 NOTE — ED Notes (Addendum)
Patients wrist rewrapped by this RN as ordered by Dr. Dolores Frame. Bleeding controlled at this time. Steri strips applied to wrist laceration. Patient refuses benzion because "It burns". Patient instructed to keep wrap on. Verbalizes understanding.

## 2016-01-11 NOTE — ED Notes (Signed)
New dressing on left wrist laceration is clean and dry.  Patient comfortable and sleeping.

## 2016-01-11 NOTE — ED Notes (Signed)
Pt sleeping at this time. Wrist w/o dressing, not bleeding at this time. Due to behavior last nite I am waiting for pt to wake up to assess prior to transfer to [facility]

## 2016-01-11 NOTE — ED Notes (Signed)
Pt. Noted in room. No complaints or concerns voiced. No distress or abnormal behavior noted. Will continue to monitor with security cameras. Q 15 minute rounds continue.speaking with dr

## 2016-01-11 NOTE — BH Assessment (Signed)
Assessment Note  [name] is an 30 y.o. female presenting to the with suicidal ideations with thoughts of cutting her wrists.  Pt reports increasing symptoms of depression.  Pt states that she "really did not want to kill herself" and was just feeling down.  She reports that she lost custody of her child.  She reports occasional alcohol and cocaine use.  She doe not feel as though she has a substance abuse problem.  She denies any auditory/visual hallucinations.  Diagnosis: Suicidal/major depression  Past Medical History:  Past Medical History  Diagnosis Date  . Depression   . History of suicide attempt   . History of substance abuse   . History of thyroid disease   . Thyroid disease     Past Surgical History  Procedure Laterality Date  . Cesarean section  2000, 2002, 2013    Family History:  Family History  Problem Relation Age of Onset  . Diabetes Mother     Social History:  reports that she has been smoking.  She does not have any smokeless tobacco history on file. She reports that she does not drink alcohol or use illicit drugs.  Additional Social History:  Alcohol / Drug Use History of alcohol / drug use?: Yes (Pt has a history of recurrent lacohol and cocaine abuse.)  CIWA: CIWA-Ar BP: (!) 119/58 mmHg Pulse Rate: 70 COWS:    Allergies:  Allergies  Allergen Reactions  . Asa [Aspirin] Anaphylaxis  . Banana Anaphylaxis  . Peanut-Containing Drug Products Shortness Of Breath, Itching and Swelling  . Pecan Nut (Diagnostic) Anaphylaxis    Home Medications:  (Not in a hospital admission)  OB/GYN Status:  Patient's last menstrual period was 11/30/2015.  General Assessment Data Location of Assessment: [facility] ED TTS Assessment: In system Is this a Tele or Face-to-Face Assessment?: Face-to-Face Is this an Initial Assessment or a Re-assessment for this encounter?: Initial Assessment Marital status: Single Maiden name: N/A Is patient pregnant?: No Pregnancy  Status: No Living Arrangements: Alone Can pt return to current living arrangement?: Yes Admission Status: Involuntary Is patient capable of signing voluntary admission?: Yes Referral Source: Self/Family/Friend Insurance type: None  Medical Screening Exam [facility] Walk-in ONLY) Medical Exam completed: Yes  Crisis Care Plan Living Arrangements: Alone Legal Guardian: Other: (self) Name of Psychiatrist: N/A Name of Therapist: N/A  Education Status Is patient currently in school?: No Current Grade: N/A Highest grade of school patient has completed: N/A Name of school: N/A Contact person: N/A  Risk to self with the past 6 months Suicidal Ideation: No Has patient been a risk to self within the past 6 months prior to admission? : No Suicidal Intent: No Has patient had any suicidal intent within the past 6 months prior to admission? : No Is patient at risk for suicide?: No Suicidal Plan?: No Has patient had any suicidal plan within the past 6 months prior to admission? : No Access to Means: No What has been your use of drugs/alcohol within the last 12 months?: ETOH, cociane Previous Attempts/Gestures: No How many times?: 0 Other Self Harm Risks: Self-inflicted cuts Triggers for Past Attempts: Other personal contacts Intentional Self Injurious Behavior: Cutting Comment - Self Injurious Behavior: Pt has a history of cutting her wrists Family Suicide History: No Recent stressful life event(s): Loss (Comment) (Loss of custody of children) Persecutory voices/beliefs?: No Depression: Yes Depression Symptoms: Loss of interest in usual pleasures, Feeling worthless/self pity, Guilt Substance abuse history and/or treatment for substance abuse?: Yes Suicide prevention  information given to non-admitted patients: Not applicable  Risk to Others within the past 6 months Homicidal Ideation: No Does patient have any lifetime risk of violence toward others beyond the six months prior to admission?  : No Thoughts of Harm to Others: No Current Homicidal Intent: No Current Homicidal Plan: No Access to Homicidal Means: No Identified Victim: None reported History of harm to others?: No Assessment of Violence: None Noted Violent Behavior Description: None identified Does patient have access to weapons?: No Criminal Charges Pending?: No Does patient have a court date: No Is patient on probation?: No  Psychosis Hallucinations: None noted Delusions: None noted  Mental Status Report Appearance/Hygiene: In scrubs Eye Contact: Fair Motor Activity: Freedom of movement Speech: Logical/coherent Level of Consciousness: Alert Mood: Depressed Affect: Depressed, Sad Anxiety Level: Minimal Thought Processes: Coherent, Relevant Judgement: Partial Orientation: Person, Place, Time, Situation Obsessive Compulsive Thoughts/Behaviors: None  Cognitive Functioning Concentration: Normal Memory: Recent Intact, Remote Intact IQ: Average Insight: Fair Impulse Control: Fair Appetite: Fair Weight Loss: 0 Weight Gain: 0 Sleep: No Change Vegetative Symptoms: None  ADLScreening [facility] Assessment Services) Patient's cognitive ability adequate to safely complete daily activities?: Yes Patient able to express need for assistance with ADLs?: Yes Independently performs ADLs?: Yes (appropriate for developmental age)  Prior Inpatient Therapy Prior Inpatient Therapy: No Prior Therapy Dates: N/A Prior Therapy Facilty/Provider(s): N/A Reason for Treatment: N/A  Prior Outpatient Therapy Prior Outpatient Therapy: No Prior Therapy Dates: N/A Prior Therapy Facilty/Provider(s): N/a Reason for Treatment: N/A Does patient have an ACCT team?: No Does patient have Intensive In-House Services?  : No Does patient have [facility] services? : No Does patient have P4CC services?: No  ADL Screening (condition at time of admission) Patient's cognitive ability adequate to safely complete daily activities?:  Yes Patient able to express need for assistance with ADLs?: Yes Independently performs ADLs?: Yes (appropriate for developmental age)       Abuse/Neglect Assessment (Assessment to be complete while patient is alone) Physical Abuse: Denies Verbal Abuse: Denies Sexual Abuse: Denies Exploitation of patient/patient's resources: Denies Self-Neglect: Denies Values / Beliefs Cultural Requests During Hospitalization: None Spiritual Requests During Hospitalization: None Consults Spiritual Care Consult Needed: No Social Work Consult Needed: No      Additional Information 1:1 In Past 12 Months?: No CIRT Risk: No Elopement Risk: No Does patient have medical clearance?: Yes     Disposition:  Disposition Initial Assessment Completed for this Encounter: Yes Disposition of Patient: Inpatient treatment program, Referred to [facility] BMU) Type of inpatient treatment program: Adult Patient referred to: Other (Comment) [facility])  On Site Evaluation by:   Reviewed with Physician:    [place] 01/11/2016 9:26 PM

## 2016-01-11 NOTE — BH Specialist Note (Signed)
Due to patient's behaviors, TTS was unable to conduct assessment at this time.  Patient was sedated and is unable to participate at this time.

## 2016-01-11 NOTE — ED Notes (Signed)
Patient to ED-BHU from ED ambulatory without difficulty to room #8.  Patient denies suicidal ideation, homicidal ideation, auditory or visual hallucinations at the present time.  Patient denies pain.  Patient is oriented to the unit and made aware of security cameras and q.15 minute safety checks.  Patient is made aware of visiting hours.  Patient is calm and cooperative upon admission interview.  Patient states that she does not wish to talk about what brought her to the hospital.  Patient has active oozing from self-inflicted left inner wrist laceration upon admission to the BHU.  Laceration was steri-stripped in the medical ER before admission to the BHU.  Gauze and tape are applied to the laceration to reinforce dressing and control oozing.

## 2016-01-11 NOTE — ED Notes (Signed)
Report received from Bill RN 

## 2016-01-11 NOTE — ED Notes (Signed)
Pt resting in bed calmly with no distress noted, q15 min done per md order.

## 2016-01-11 NOTE — ED Notes (Signed)
Pt transferred to BHU-8 via wheelchair with tech and officer. Report given to [place]

## 2016-01-11 NOTE — ED Notes (Signed)
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
## 2016-01-11 NOTE — ED Notes (Signed)
Report received from Emma RN. Patient care assumed. Patient/RN introduction complete. Will continue to monitor.  

## 2016-01-11 NOTE — ED Notes (Signed)
Amy C, RN and Tammy, EDT at bedside to obtain blood for labs. 21G butterfly needle used. Blood returned noted. Patient screaming at staff, "It fucking hurts". Patient turns on side, and rips out butterfly needle and is thrashing around in bed.

## 2016-01-12 DIAGNOSIS — F17200 Nicotine dependence, unspecified, uncomplicated: Secondary | ICD-10-CM

## 2016-01-12 DIAGNOSIS — F1020 Alcohol dependence, uncomplicated: Secondary | ICD-10-CM

## 2016-01-12 DIAGNOSIS — F332 Major depressive disorder, recurrent severe without psychotic features: Principal | ICD-10-CM

## 2016-01-12 DIAGNOSIS — F1590 Other stimulant use, unspecified, uncomplicated: Secondary | ICD-10-CM

## 2016-01-12 LAB — HEMOGLOBIN A1C: Hgb A1c MFr Bld: 4.9 % (ref 4.0–6.0)

## 2016-01-12 LAB — LIPID PANEL
Cholesterol: 188 mg/dL (ref 0–200)
HDL: 82 mg/dL (ref >40)
LDL CALC: 82 mg/dL (ref 0–99)
Total CHOL/HDL Ratio: 2.3 RATIO
Triglycerides: 119 mg/dL (ref <150)
VLDL: 24 mg/dL (ref 0–40)

## 2016-01-12 MED ORDER — PSEUDOEPHEDRINE HCL ER 120 MG PO TB12
120.0000 mg | ORAL_TABLET | Freq: Two times a day (BID) | ORAL | Status: AC
  Administered 2016-01-12 – 2016-01-13 (×3): 120 mg via ORAL
  Filled 2016-01-12 (×4): qty 1

## 2016-01-12 MED ORDER — CITALOPRAM HYDROBROMIDE 20 MG PO TABS
20.0000 mg | ORAL_TABLET | Freq: Every day | ORAL | Status: DC
  Administered 2016-01-12 – 2016-01-14 (×3): 20 mg via ORAL
  Filled 2016-01-12 (×3): qty 1

## 2016-01-12 MED ORDER — TRAZODONE HCL 100 MG PO TABS
100.0000 mg | ORAL_TABLET | Freq: Every evening | ORAL | Status: DC | PRN
  Administered 2016-01-12 – 2016-01-13 (×3): 100 mg via ORAL
  Filled 2016-01-12 (×3): qty 1

## 2016-01-12 MED ORDER — MAGNESIUM HYDROXIDE 400 MG/5ML PO SUSP: 30.0000 mL | Freq: Every day | ORAL | Status: DC | PRN

## 2016-01-12 MED ORDER — FLUTICASONE PROPIONATE 50 MCG/ACT NA SUSP
1.0000 | Freq: Every day | NASAL | Status: DC
  Administered 2016-01-13 – 2016-01-14 (×2): 1 via NASAL
  Filled 2016-01-12: qty 16

## 2016-01-12 MED ORDER — NICOTINE 21 MG/24HR TD PT24
21.0000 mg | MEDICATED_PATCH | Freq: Every day | TRANSDERMAL | Status: DC
  Administered 2016-01-12 – 2016-01-14 (×3): 21 mg via TRANSDERMAL
  Filled 2016-01-12 (×2): qty 1

## 2016-01-12 MED ORDER — ACETAMINOPHEN 325 MG PO TABS
650.0000 mg | ORAL_TABLET | Freq: Four times a day (QID) | ORAL | Status: DC | PRN
  Administered 2016-01-12 – 2016-01-14 (×4): 650 mg via ORAL
  Filled 2016-01-12 (×4): qty 2

## 2016-01-12 MED ORDER — ALUM & MAG HYDROXIDE-SIMETH 200-200-20 MG/5ML PO SUSP
30.0000 mL | ORAL | Status: DC | PRN
  Administered 2016-01-13: 30 mL via ORAL
  Filled 2016-01-12: qty 30

## 2016-01-12 NOTE — Plan of Care (Signed)
Problem: Ineffective individual coping Goal: LTG: Patient will report a decrease in negative feelings Outcome: Progressing Attending unit programing  Voice of benefits  Less negativity     

## 2016-01-12 NOTE — Tx Team (Signed)
Interdisciplinary Treatment Plan Update (Adult)        Date: 01/12/2016   Time Reviewed: 9:30 AM   Progress in Treatment: Improving  Attending groups: Yes Participating in groups: Yes Taking medication as prescribed: Yes  Tolerating medication: Yes  Family/Significant other contact made: No, pt refused Patient understands diagnosis: Yes  Discussing patient identified problems/goals with staff: Yes  Medical problems stabilized or resolved: Yes  Denies suicidal/homicidal ideation: Yes  Issues/concerns per patient self-inventory: Yes  Other:   New problem(s) identified: N/A   Discharge Plan or Barriers: Pt will return home to [facility] to her apartment to live and plans to follow up with outpatient services with [facility] for medication management, substance abuse treatment and therapy.  Reason for Continuation of Hospitalization:   Depression   Anxiety   Medication Stabilization   Comments: N/A   Estimated length of stay: 3-5 days    Pt is a 30 year old single African-American female from [place] who presented voluntarily to our emergency department after she had cut herself with a razor blade. Patient cut herself superficially on her left wrist. Patient denies this was a suicidal attempt. She says she cut herself because it helps her release tension. Per the notes the patient was combative in the emergency department. Her alcohol level was 112 and her urine toxicology was positive for cocaine. Patient is a limited historian. Patient tells me she feels fine. His denying now having suicidality, homicidality or auditory or visual hallucinations. Denies having problems with his sleep, appetite energy or concentration.  Says she is currently unemployed and has been unemployed for several months. She used to work as a Scientist, water quality at [employer]. She is living in a income base apartment by herself. When I ask her how she is supporting herself financially  she is stated "by the grace of God". She denies receiving any other financial assistance.  Patient tells me she is being pregnant 4 times she became pregnant with her first child at the age of 13. This child passed away around the age of 31 months. Per her description looks like the child suffer from respiratory complications and failure to thrive. Patient had her second child in 10th grade therefore she dropped out of the school. This child is now 50 years old. She has a 87-year-old and also 58 month old who are from the same father. None of the children live with her. Social services is involved with the case and the children lived with their fathers.  Patient tells me the children were removed because she tested positive for cocaine during her pregnancy.  Patient stated that she only drinks alcohol every other month denies that the use of alcohol is excessive. As far as cocaine she is states she uses powder cocaine very rarely. Per records on care everywhere patient does have a past history of cocaine use while she was follow-up during her last pregnancy . Patient says she smokes marijuana rarely her last use was a blunt 3 days ago. Patient lives in [place], [place]. Patient will benefit from crisis stabilization, medication evaluation, group therapy, and psycho education in addition to case management for discharge planning. Patient and CSW reviewed pt's identified goals and treatment plan. Pt verbalized understanding and agreed to treatment plan.    Review of initial/current patient goals per problem list:  1. Goal(s): Patient will participate in aftercare plan   Met: Yes  Target date: 3-5 days post admission date   As evidenced by:  Patient will participate within aftercare plan AEB aftercare provider and housing plan at discharge being identified.   1/19:  Pt will return home to [facility] to her apartment to live and plans to follow up with outpatient services with [facility] for medication  management, substance abuse treatment and therapy.    2. Goal (s): Patient will exhibit decreased depressive symptoms and suicidal ideations.   Met: No  Target date: 3-5 days post admission date   As evidenced by: Patient will utilize self-rating of depression at 3 or below and demonstrate decreased signs of depression or be deemed stable for discharge by MD.   1/19: Goal progressing.  Pt denies SI.    3. Goal(s): Patient will demonstrate decreased signs and symptoms of anxiety.   Met: No  Target date: 3-5 days post admission date   As evidenced by: Patient will utilize self-rating of anxiety at 3 or below and demonstrated decreased signs of anxiety, or be deemed stable for discharge by MD   1/19: Goal progressing.    4. Goal(s): Patient will demonstrate decreased signs of withdrawal due to substance abuse   Met: Yes  Target date: 3-5 days post admission date   As evidenced by: Patient will produce a CIWA/COWS score of 0, have stable vitals signs, and no symptoms of withdrawal   1/19: per medical charts patient has produced a CIWA/COWS score of 0, has stable vitals signs, and no symptoms of withdrawal    Attendees:  Patient:  Family:  Physician: Dr. Jerilee Hoh, MD    01/12/2016 9:30 AM  Nursing: Polly Cobia, RN     01/12/2016 9:30 AM  Clinical Social Worker: Marylou Flesher, [place]  01/12/2016 9:30 AM  Clinical Social Worker: Carmell Austria, [place]  01/12/2016 9:30 AM  Nursing: Carolynn Sayers     01/12/2016 9:30 AM  Other:        01/12/2016 9:30 AM  Other:        01/12/2016 9:30 AM

## 2016-01-12 NOTE — Progress Notes (Signed)
Pt admitted to unit from [facility]. Pt is alert and oriented x4. Pt is calm and cooperative upon assessment. Affect is anxious and depressed. Denies SI/HI/AVH at this time. Denies pain. Pt denies alcohol and drug use, although review of her chart confirms cocaine and alcohol use. Pt is currently on her menses. Pt reports that she lives alone and cannot identify a support system. Per chart, pt has recently lost custody of her child. Skin assessed and no contraband found. A tattoo is noted on pt's right hand. Pt has lacerations on her left wrist which were steri-stripped in the medical ER. Guaze dressing was noted to be bloody so writer changed gauze and reinforced with tape. Pt is oriented to unit and provided with hygiene supplies. Pt given a sandwich tray and ate in the dayroom. Upon request, PRN trazodone was administered. q15 minute safety checks maintained. Pt remains free from harm.

## 2016-01-12 NOTE — Progress Notes (Signed)
Recreation Therapy Notes  Date: 01.18.17 Time: 3:00 pm Location: Craft Room  Group Topic: Self-esteem  Goal Area(s) Addresses:  Patient will write at least one positive trait. Patient will verbalize benefit of having a healthy self-esteem.  Behavioral Response: Arrived late, Attentive  Intervention: I Am  Activity: Patients were given a worksheet with the letter I on it and instructed to write as many positive traits inside the letter.  Education: LRT educated patients on ways they can increase their self-esteem.  Education Outcome: In group clarification offerend  Clinical Observations/Feedback: Patient did not participate in group activity. Patient did not contribute to group discussion.  Jacquelynn Cree, LRT/CTRS 01/12/2016 4:51 PM

## 2016-01-12 NOTE — [facility] Suicide Risk Assessment (Signed)
[facility] INPATIENT:  Family/Significant Other Suicide Prevention Education  Suicide Prevention Education:  Patient Refusal for Family/Significant Other Suicide Prevention Education: The patient [name] has refused to provide written consent for family/significant other to be provided Family/Significant Other Suicide Prevention Education during admission and/or prior to discharge.  Physician notified.  Completed with pt.  Dorothe Pea Randen Kauth 01/12/2016, 4:48 PM

## 2016-01-12 NOTE — Tx Team (Signed)
Initial Interdisciplinary Treatment Plan   PATIENT STRESSORS: Loss of custody of her children Substance abuse   PATIENT STRENGTHS: Average or above average intelligence Capable of independent living General fund of knowledge Physical Health   PROBLEM LIST: Problem List/Patient Goals Date to be addressed Date deferred Reason deferred Estimated date of resolution  Depression 01/12/16     Self harm behaviors 01/12/16     Substance abuse 01/12/16                                          DISCHARGE CRITERIA:  Ability to meet basic life and health needs Improved stabilization in mood, thinking, and/or behavior Motivation to continue treatment in a less acute level of care Need for constant or close observation no longer present Reduction of life-threatening or endangering symptoms to within safe limits Verbal commitment to aftercare and medication compliance  PRELIMINARY DISCHARGE PLAN: Attend aftercare/continuing care group Attend 12-step recovery group  PATIENT/FAMIILY INVOLVEMENT: This treatment plan has been presented to and reviewed with the patient, [name], and/or family member.  The patient and family have been given the opportunity to ask questions and make suggestions.  [name] 01/12/2016, 12:52 AM
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
## 2016-01-12 NOTE — Progress Notes (Signed)
D: Patient stated slept fair last night .Stated appetite is good and energy level  Is normal. Stated concentration is good . Stated on Depression scale 8 , hopeless 8 and anxiety 8.( low 0-10 high) Denies suicidal  homicidal ideations  .  No auditory hallucinations  No pain concerns . Appropriate ADL'S. Interacting with peers and staff. Patient voice of goal to get Her kids back . Stated her boyfriend  Has the kids and wont give them back to her . A: Encourage patient participation with unit programming . Instruction  Given on  Medication , verbalize understanding. Encourage patient what must she do to get her kids back. Review with patient her substance abuse issues, and how will she stop . R: Voice no other concerns. Staff continue to monitor

## 2016-01-12 NOTE — BHH Group Notes (Signed)
BHH Group Notes:  (Nursing/MHT/Case Management/Adjunct)  Date:  01/12/2016  Time:  12:20 PM  Type of Therapy:  Psychoeducational Skills  Participation Level:  Did Not Attend   [name] [name] [name] 01/12/2016, 12:20 PM 

## 2016-01-12 NOTE — BHH Group Notes (Signed)
BHH Group Notes:  (Nursing/MHT/Case Management/Adjunct)  Date:  01/12/2016  Time:  11:49 PM  Type of Therapy:  Evening Wrap-up Group  Participation Level:  Active  Participation Quality:  Appropriate and Attentive  Affect:  Appropriate  Cognitive:  Alert  Insight:  Good  Engagement in Group:  Engaged  Modes of Intervention:  Discussion  Summary of Progress/Problems:  [name] 01/12/2016, 11:49 PM

## 2016-01-12 NOTE — Progress Notes (Signed)
Recreation Therapy Notes  At approximately 11:35 am, LRT attempted assessment. Patient sleeping.  Jacquelynn Cree, LRT/CTRS 01/12/2016 1:51 PM

## 2016-01-12 NOTE — BHH Group Notes (Signed)
BHH LCSW Group Therapy  01/12/2016 4:07 PM  Type of Therapy:  Group Therapy  Participation Level:  Minimal  Participation Quality:  Appropriate  Affect:  Flat  Cognitive:  Appropriate  Insight:  Limited  Engagement in Therapy:  attentive while present, called out by psychiatrist.  Modes of Intervention:  Discussion, Exploration, Problem-solving and Socialization  Summary of Progress/Problems:Pt attended and participated in group discussion of emotions and emotion regulation.  Group identified skills that can be used to lessen "trigger" emotions to allow time to make less impulsive decisions and to get [place] "unstuck".   Pt identified that for her feeling down led to her drinking to change her mood, group identified that this might be effective in short term ways but often made life more difficult after drug/alcohol wears off.  Pt called out of group to meet with Psychiatrist.  Glennon Mac, MSW, LCSW 01/12/2016, 4:07 PM

## 2016-01-12 NOTE — H&P (Signed)
Psychiatric Admission Assessment Adult  Patient Identification: [name] MRN:  [id number] Date of Evaluation:  01/12/2016 Chief Complaint:  major depression Principal Diagnosis: Severe recurrent major depression without psychotic features [facility]) Diagnosis:   Patient Active Problem List   Diagnosis Date Noted  . Tobacco use disorder [F17.200] 01/12/2016  . Stimulant use disorder (HCC) (cocaine) [F15.90] 01/12/2016  . Alcohol use disorder, moderate, dependence (HCC) [F10.20] 01/12/2016  . Self-inflicted laceration of wrist [S61.519A] 01/11/2016  . Severe recurrent major depression without psychotic features [facility]) [F33.2] 01/11/2016   History of Present Illness:  30 year old single African-American female from [place] who presented voluntarily to our emergency department after she had cut herself with a razor blade. Patient cut herself superficially on her left wrist.  Patient denies this was a suicidal attempt. She says she cut herself because it helps her release tension.    Per the notes the patient was combative in the emergency department. Her alcohol level was 112 and her urine toxicology was positive for cocaine.  Patient is a limited historian. Patient tells me she feels fine. His denying now having suicidality, homicidality or auditory or visual hallucinations. Denies having problems with his sleep, appetite energy or concentration.  Says she is currently unemployed and has been unemployed for several months. She used to work as a Conservation officer, nature at [employer]. She is living in a income base apartment by herself. When I ask her how she is supporting herself financially she is stated "by the grace of God". She denies receiving any other financial assistance.  Patient tells me she is being pregnant 4 times she became pregnant with her first child at the age of 19. This child passed away around the age of 72 months. Per her description looks like the child suffer  from respiratory complications and failure to thrive. Patient had her second child in 10th grade therefore she dropped out of the school.  This child is now 70 years old. She has a 52-year-old and also 65 month old who are from the same father. None of the children live with her. Social services is involved with the case and the children lived with their fathers.   Patient tells me the children were removed because she tested positive for cocaine during her pregnancy.  Substance abuse history : Patient stated that she only drinks alcohol every other month denies that the use of alcohol is excessive. As far as cocaine she is states she uses powder cocaine very rarely. Per records on care everywhere patient does have a past history of cocaine use while she was follow-up during her last pregnancy .  Patient says she smokes marijuana rarely her last use was a blunt 3 days ago.   Associated Signs/Symptoms: Depression Symptoms:  depressed mood, (Hypo) Manic Symptoms:  denies Anxiety Symptoms:  denies Psychotic Symptoms:  denies PTSD Symptoms: NA   Total Time spent with patient: 1 hour  Past Psychiatric History: Patient states she was hospitalized here when she was pregnant with her 42-year-old. Says she was here for depression. Denies any history of prior suicidal attempts. As far as self injury she says she used to cut herself as a child but denies doing these often.  She is currently not seen by a psychiatrist and is currently not on any psychotropics   Risk to Self: Is patient at risk for suicide?: Yes Risk to Others:  no  Past Medical History:  denies history of seizures, head trauma, or chronic medical  conditions  Past Medical History  Diagnosis Date  . Depression   . History of suicide attempt   . History of substance abuse   . History of thyroid disease   . Thyroid disease     Past Surgical History  Procedure Laterality Date  . Cesarean section  2000, 2002, 2013   Family History:   Family History  Problem Relation Age of Onset  . Diabetes Mother    Family Psychiatric  History:  denies history of mental illness, suicide or substance abuse in her family   Social History:  patient is single, never married, currently unemployed, lives by herself in an income based upon major in [place]. Patient has 2 children still youngest is 21 months old and the oldest is 31 year old of the children live with her. Social services involved in the case the children stay with their fathers. Patient says she gets to see her children every other weekend.  Denies ever having any trouble with the law . As far as her education the patient dropped out of school in the 10th grade as she was pregnant with her second child. Patient stated that during the school she attended regular classes.  Patient was raised by her mother, she was born and raised here in [place]. She lives with her grandmother after she dropped out of high school.  Patient has a sister who is 88 years old. She has a fair relationship with her sister and she doesn't feel her mother is supportive . History  Alcohol Use No     History  Drug Use No    Social History   Social History  . Marital Status: Single    Spouse Name: N/A  . Number of Children: N/A  . Years of Education: N/A   Social History Main Topics  . Smoking status: Current Every Day Smoker -- 0.50 packs/day  . Smokeless tobacco: Never Used  . Alcohol Use: No  . Drug Use: No  . Sexual Activity: No   Other Topics Concern  . None   Social History Narrative    Allergies:   Allergies  Allergen Reactions  . Asa [Aspirin] Anaphylaxis  . Banana Anaphylaxis  . Peanut-Containing Drug Products Shortness Of Breath, Itching and Swelling  . Pecan Nut (Diagnostic) Anaphylaxis   Lab Results:  Results for orders placed or performed during the hospital encounter of 01/11/16 (from the past 48 hour(s))  Lipid panel     Status: None   Collection Time: 01/12/16  12:49 PM  Result Value Ref Range   Cholesterol 188 0 - 200 mg/dL   Triglycerides 829 <562 mg/dL   HDL 82 >13 mg/dL   Total CHOL/HDL Ratio 2.3 RATIO   VLDL 24 0 - 40 mg/dL   LDL Cholesterol 82 0 - 99 mg/dL    Comment:        Total Cholesterol/HDL:CHD Risk Coronary Heart Disease Risk Table                     Men   Women  1/2 Average Risk   3.4   3.3  Average Risk       5.0   4.4  2 X Average Risk   9.6   7.1  3 X Average Risk  23.4   11.0        Use the calculated Patient Ratio above and the CHD Risk Table to determine the patient's CHD Risk.        ATP III CLASSIFICATION (  LDL):  <100     mg/dL   Optimal  409-811  mg/dL   Near or Above                    Optimal  130-159  mg/dL   Borderline  914-782  mg/dL   High  >956     mg/dL   Very High   Hemoglobin A1c     Status: None   Collection Time: 01/12/16 12:49 PM  Result Value Ref Range   Hgb A1c MFr Bld 4.9 4.0 - 6.0 %    Metabolic Disorder Labs:  Lab Results  Component Value Date   HGBA1C 4.9 01/12/2016   No results found for: PROLACTIN Lab Results  Component Value Date   CHOL 188 01/12/2016   TRIG 119 01/12/2016   HDL 82 01/12/2016   CHOLHDL 2.3 01/12/2016   VLDL 24 01/12/2016   LDLCALC 82 01/12/2016    Current Medications: Current Facility-Administered Medications  Medication Dose Route Frequency Provider Last Rate Last Dose  . acetaminophen (TYLENOL) tablet 650 mg  650 mg Oral Q6H PRN Audery Amel, MD      . alum & mag hydroxide-simeth (MAALOX/MYLANTA) 200-200-20 MG/5ML suspension 30 mL  30 mL Oral Q4H PRN Audery Amel, MD      . citalopram (CELEXA) tablet 20 mg  20 mg Oral Daily Audery Amel, MD   20 mg at 01/12/16 0927  . fluticasone (FLONASE) 50 MCG/ACT nasal spray 1 spray  1 spray Each Nare Daily Jimmy Footman, MD   1 spray at 01/12/16 1318  . magnesium hydroxide (MILK OF MAGNESIA) suspension 30 mL  30 mL Oral Daily PRN Audery Amel, MD      . pseudoephedrine (SUDAFED) 12 hr tablet  120 mg  120 mg Oral BID Jimmy Footman, MD   120 mg at 01/12/16 1317  . traZODone (DESYREL) tablet 100 mg  100 mg Oral QHS PRN Audery Amel, MD   100 mg at 01/12/16 0015   PTA Medications: Prescriptions prior to admission  Medication Sig Dispense Refill Last Dose  . fluticasone (FLONASE) 50 MCG/ACT nasal spray Place 1 spray into both nostrils daily.   11/16/2015 at am    Musculoskeletal: Strength & Muscle Tone: within normal limits Gait & Station: normal Patient leans: N/A  Psychiatric Specialty Exam: Physical Exam  Constitutional: She is oriented to person, place, and time. She appears well-developed and well-nourished.  HENT:  Head: Normocephalic and atraumatic.  Eyes: Conjunctivae and EOM are normal.  Neck: Normal range of motion. Neck supple.  Respiratory: Effort normal and breath sounds normal.  Musculoskeletal: Normal range of motion.  Neurological: She is alert and oriented to person, place, and time.  Skin: Skin is warm and dry.    Review of Systems  Constitutional: Negative.   HENT: Positive for congestion.   Eyes: Negative.   Respiratory: Negative.   Cardiovascular: Negative.   Gastrointestinal: Negative.   Genitourinary: Negative.   Musculoskeletal: Negative.   Skin: Negative.   Neurological: Negative.   Endo/Heme/Allergies: Negative.   Psychiatric/Behavioral: Positive for substance abuse. Negative for depression, suicidal ideas, hallucinations and memory loss. The patient is not nervous/anxious and does not have insomnia.     Blood pressure 109/74, pulse 88, temperature 98.4 F (36.9 C), temperature source Oral, resp. rate 18, height  (1.499 m), weight 54.432 kg (120 lb), last menstrual period 01/12/2016, SpO2 100 %.Body mass index is 24.22 kg/(m^2).  General Appearance: [employer]  Contact::  Good  Speech:  Clear and Coherent  Volume:  Normal  Mood:  Dysphoric  Affect:  Inappropriate  Thought Process:  concrete  Orientation:  Full  (Time, Place, and Person)  Thought Content:  Hallucinations: None  Suicidal Thoughts:  No  Homicidal Thoughts:  No  Memory:  Immediate;   Fair Recent;   Fair Remote;   Fair  Judgement:  Poor  Insight:  Shallow  Psychomotor Activity:  Decreased  Concentration:  Fair  Recall:  [employer] of Knowledge:Fair  Language: Good  Akathisia:  No  Handed:    AIMS (if indicated):     Assets:  Manufacturing systems engineer Housing Physical Health  ADL's:  Intact  Cognition: WNL  Sleep:  Number of Hours: 4.5     Treatment Plan Summary: Daily contact with patient to assess and evaluate symptoms and progress in treatment and Medication management   30 year old female with depression and presented intoxicated and with self injuries to our emergency department. She is being hospitalized here before for depression. She has a history of cocaine use. In the emergency department she was found to be positive for cocaine and her alcohol level was 110. Patient is currently not connected to any mental health agency.  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  Insomnia: Continue trazodone 100 mg by mouth daily at bedtime  Allergies: Continue Flonase daily  Nasal congestion: Continue pseudoephedrine 120 mg every 12 hours  Tobacco use disorder: Will order nicotine patch 21 mg  Precautions every 15 minute checks  Diet regular  Vital signs daily  Hospitalization and status voluntary  Discharge disposition: Will return home when stable  Discharge follow-up: We will schedule her to follow-up with [facility] or [facility].   I certify that inpatient services furnished can reasonably be expected to improve the patient's condition.   Jimmy Footman 1/18/20173:48 PM

## 2016-01-12 NOTE — BHH Suicide Risk Assessment (Signed)
[facility] Admission Suicide Risk Assessment   Nursing information obtained from:  Patient, Review of record Demographic factors:  Living alone Current Mental Status:  Self-harm behaviors Loss Factors:  Loss of significant relationship (lost custody of her children) Historical Factors:  Prior suicide attempts Risk Reduction Factors:  Responsible for children under 31 years of age  Total Time spent with patient: 1 hour Principal Problem: <principal problem not specified> Diagnosis:   Patient Active Problem List   Diagnosis Date Noted  . Tobacco use disorder [F17.200] 01/12/2016  . Stimulant use disorder (HCC) (cocaine) [F15.90] 01/12/2016  . Alcohol use disorder, moderate, dependence (HCC) [F10.20] 01/12/2016  . Self-inflicted laceration of wrist [S61.519A] 01/11/2016  . Severe recurrent major depression without psychotic features (HCC) [F33.2] 01/11/2016   Subjective Data:   Continued Clinical Symptoms:  Alcohol Use Disorder Identification Test Final Score (AUDIT): 0 The "Alcohol Use Disorders Identification Test", Guidelines for Use in Primary Care, Second Edition.  World Science writer [facility]). Score between 0-7:  no or low risk or alcohol related problems. Score between 8-15:  moderate risk of alcohol related problems. Score between 16-19:  high risk of alcohol related problems. Score 20 or above:  warrants further diagnostic evaluation for alcohol dependence and treatment.   CLINICAL FACTORS:   Depression:   Comorbid alcohol abuse/dependence Impulsivity Alcohol/Substance Abuse/Dependencies Previous Psychiatric Diagnoses and Treatments   Musculoskeletal:   Psychiatric Specialty Exam: ROS  Blood pressure 109/74, pulse 88, temperature 98.4 F (36.9 C), temperature source Oral, resp. rate 18, height  (1.499 m), weight 54.432 kg (120 lb), last menstrual period 01/12/2016, SpO2 100 %.Body mass index is 24.22 kg/(m^2).                                                         COGNITIVE FEATURES THAT CONTRIBUTE TO RISK:  Closed-mindedness    SUICIDE RISK:   Moderate:  Frequent suicidal ideation with limited intensity, and duration, some specificity in terms of plans, no associated intent, good self-control, limited dysphoria/symptomatology, some risk factors present, and identifiable protective factors, including available and accessible social support.  PLAN OF CARE: admit to [facility]  I certify that inpatient services furnished can reasonably be expected to improve the patient's condition.   Jimmy Footman, MD 01/12/2016, 3:37 PM

## 2016-01-13 LAB — THYROID PANEL WITH TSH
FREE THYROXINE INDEX: 1.7 (ref 1.2–4.9)
T3 UPTAKE RATIO: 32 % (ref 24–39)
T4 TOTAL: 5.2 ug/dL (ref 4.5–12.0)
TSH: 0.709 u[IU]/mL (ref 0.450–4.500)

## 2016-01-13 MED ORDER — CITALOPRAM HYDROBROMIDE 20 MG PO TABS
20.0000 mg | ORAL_TABLET | Freq: Every day | ORAL | Status: DC
Start: 2016-01-13 — End: 2018-01-04

## 2016-01-13 MED ORDER — TRAZODONE HCL 100 MG PO TABS: 100.0000 mg | ORAL_TABLET | Freq: Every evening | ORAL | Status: DC | PRN

## 2016-01-13 MED ORDER — BACITRACIN-NEOMYCIN-POLYMYXIN OINTMENT TUBE
TOPICAL_OINTMENT | Freq: Every day | CUTANEOUS | Status: DC
  Administered 2016-01-14: 09:00:00 via TOPICAL
  Filled 2016-01-13 (×2): qty 15

## 2016-01-13 MED ORDER — HYDROCORTISONE 1 % EX CREA
TOPICAL_CREAM | Freq: Every day | CUTANEOUS | Status: DC
  Filled 2016-01-13: qty 28

## 2016-01-13 NOTE — Progress Notes (Signed)
Recreation Therapy Notes  INPATIENT RECREATION THERAPY ASSESSMENT  Patient Details Name: LAISHA RAU MRN: [id number] DOB: 08-Mar-1986 Today's Date: 01/13/2016  Patient Stressors: Family, Relationship, Death, Other (Comment) (Family does not understand her and what she is going through; she feel like she treats her boyrfriend well, but he does not treat her the same; uncle died recently; kids were taken away 5 months ago)  Coping Skills:   Isolate, Arguments, Substance Abuse, Avoidance, Self-Injury, Art/Dance, Music  Personal Challenges: Anger, Communication, Decision-Making, Relationships, Self-Esteem/Confidence, Substance Abuse, Trusting Others  Leisure Interests (2+):  Individual - Other (Comment) Adriana Simas, play with her children)  Awareness of Community Resources:  Yes  Community Resources:  Library, [employer]  Current Use: Yes  If no, Barriers?:    Patient Strengths:  Outspoken, height  Patient Identified Areas of Improvement:  No  Current Recreation Participation:  Nothing  Patient Goal for Hospitalization:  To get better and seek help once she is d/c  [place] of Residence:  [place] of Residence:  [place]   Current SI (including self-harm):  No  Current HI:  No  Consent to Intern Participation: N/A   Jacquelynn Cree, LRT/CTRS 01/13/2016, 2:21 PM

## 2016-01-13 NOTE — [facility] Group Notes (Signed)
[facility] Group Notes:  (Nursing/MHT/Case Management/Adjunct)  Date:  01/13/2016  Time:  1:47 PM  Type of Therapy:  Group Therapy  Participation Level:  Active  Participation Quality:  Appropriate and Supportive  Affect:  Appropriate  Cognitive:  Alert and Appropriate  Insight:  Good  Engagement in Group:  Improving  Modes of Intervention:  Activity  Summary of Progress/Problems:  [name] 01/13/2016, 1:47 PM

## 2016-01-13 NOTE — Plan of Care (Signed)
Problem: Alteration in mood Goal: STG-Patient is able to discuss feelings and issues (Patient is able to discuss feelings and issues leading to depression)  Outcome: Progressing Attending unit programing verbalizing feeling

## 2016-01-13 NOTE — Progress Notes (Signed)
[facility] MD Progress Note  01/13/2016 10:52 AM [name]  MRN:  [id number] Subjective:  Pt reports feeling well.  Denies having SI, HI or hallucinations.  Denies having major problems with sleep, appetite, energy or concentration. Denies SE from medications and denies having any physical complaints.  Per Nursing: Calm and cooperative. Attends group. Med compliant. C/o hand pain. Tylenol 650 mg and trazodone 100 mg given po PRN as ordered. Denies SI/HI/AV/H. No behavior problems noted. Slept 7.15 hours    Principal Problem: Severe recurrent major depression without psychotic features (HCC) Diagnosis:   Patient Active Problem List   Diagnosis Date Noted  . Tobacco use disorder [F17.200] 01/12/2016  . Stimulant use disorder (HCC) (cocaine) [F15.90] 01/12/2016  . Alcohol use disorder, moderate, dependence (HCC) [F10.20] 01/12/2016  . Self-inflicted laceration of wrist [S61.519A] 01/11/2016  . Severe recurrent major depression without psychotic features (HCC) [F33.2] 01/11/2016   Total Time spent with patient: 30 minutes   Sleep: Good  Appetite:  Good   Past Psychiatric History: Patient states she was hospitalized here when she was pregnant with her 16-year-old. Says she was here for depression. Denies any history of prior suicidal attempts. As far as self injury she says she used to cut herself as a child but denies doing these often.  She is currently not seen by a psychiatrist and is currently not on any psychotropics   Risk to Self: Is patient at risk for suicide?: Yes Risk to Others:  no  Past Medical History: denies history of seizures, head trauma, or chronic medical conditions  Past Medical History  Diagnosis Date  . Depression   . History of suicide attempt   . History of substance abuse   . History of thyroid disease   . Thyroid disease     Past Surgical History  Procedure Laterality Date  . Cesarean section  2000, 2002, 2013   Family  History:  Family History  Problem Relation Age of Onset  . Diabetes Mother    Family Psychiatric History: denies history of mental illness, suicide or substance abuse in her family   Social History: patient is single, never married, currently unemployed, lives by herself in an income based upon major in [place]. Patient has 2 children still youngest is 71 months old and the oldest is 31 year old of the children live with her. Social services involved in the case the children stay with their fathers. Patient says she gets to see her children every other weekend.  Denies ever having any trouble with the law . As far as her education the patient dropped out of school in the 10th grade as she was pregnant with her second child. Patient stated that during the school she attended regular classes.  Patient was raised by her mother, she was born and raised here in [place]. She lives with her grandmother after she dropped out of high school. Patient has a sister who is 77 years old. She has a fair relationship with her sister and she doesn't feel her mother is supportive . History         Current Medications: Current Facility-Administered Medications  Medication Dose Route Frequency Provider Last Rate Last Dose  . acetaminophen (TYLENOL) tablet 650 mg  650 mg Oral Q6H PRN Audery Amel, MD   650 mg at 01/12/16 2129  . alum & mag hydroxide-simeth (MAALOX/MYLANTA) 200-200-20 MG/5ML suspension 30 mL  30 mL Oral Q4H PRN Audery Amel, MD      . citalopram (CELEXA)  tablet 20 mg  20 mg Oral Daily Audery Amel, MD   20 mg at 01/13/16 0843  . fluticasone (FLONASE) 50 MCG/ACT nasal spray 1 spray  1 spray Each Nare Daily Jimmy Footman, MD   1 spray at 01/13/16 0843  . magnesium hydroxide (MILK OF MAGNESIA) suspension 30 mL  30 mL Oral Daily PRN Audery Amel, MD      . nicotine (NICODERM CQ - dosed in mg/24 hours) patch 21 mg  21 mg Transdermal Daily Jimmy Footman, MD   21 mg at 01/13/16 0950  . pseudoephedrine (SUDAFED) 12 hr tablet 120 mg  120 mg Oral BID Jimmy Footman, MD   120 mg at 01/13/16 0846  . traZODone (DESYREL) tablet 100 mg  100 mg Oral QHS PRN Audery Amel, MD   100 mg at 01/12/16 2129    Lab Results:  Results for orders placed or performed during the hospital encounter of 01/11/16 (from the past 48 hour(s))  Thyroid Panel With TSH     Status: None   Collection Time: 01/12/16 12:49 PM  Result Value Ref Range   TSH 0.709 0.450 - 4.500 uIU/mL   T4, Total 5.2 4.5 - 12.0 ug/dL   T3 Uptake Ratio 32 24 - 39 %   Free Thyroxine Index 1.7 1.2 - 4.9    Comment: (NOTE) Performed At: [facility] [address] [place], [place] [id number] Mila Homer MD NW:[id number]   Lipid panel     Status: None   Collection Time: 01/12/16 12:49 PM  Result Value Ref Range   Cholesterol 188 0 - 200 mg/dL   Triglycerides 578 <469 mg/dL   HDL 82 >62 mg/dL   Total CHOL/HDL Ratio 2.3 RATIO   VLDL 24 0 - 40 mg/dL   LDL Cholesterol 82 0 - 99 mg/dL    Comment:        Total Cholesterol/HDL:CHD Risk Coronary Heart Disease Risk Table                     Men   Women  1/2 Average Risk   3.4   3.3  Average Risk       5.0   4.4  2 X Average Risk   9.6   7.1  3 X Average Risk  23.4   11.0        Use the calculated Patient Ratio above and the CHD Risk Table to determine the patient's CHD Risk.        ATP III CLASSIFICATION (LDL):  <100     mg/dL   Optimal  952-841  mg/dL   Near or Above                    Optimal  130-159  mg/dL   Borderline  324-401  mg/dL   High  >027     mg/dL   Very High   Hemoglobin A1c     Status: None   Collection Time: 01/12/16 12:49 PM  Result Value Ref Range   Hgb A1c MFr Bld 4.9 4.0 - 6.0 %    Physical Findings: AIMS: Facial and Oral Movements Muscles of Facial Expression: None, normal Lips and Perioral Area: None, normal Jaw: None, normal Tongue: None, normal,Extremity  Movements Upper (arms, wrists, hands, fingers): None, normal Lower (legs, knees, ankles, toes): None, normal, Trunk Movements Neck, shoulders, hips: None, normal, Overall Severity Severity of abnormal movements (highest score from questions above): None, normal Incapacitation due  to abnormal movements: None, normal Patient's awareness of abnormal movements (rate only patient's report): No Awareness, Dental Status Current problems with teeth and/or dentures?: No Does patient usually wear dentures?: No  CIWA:    COWS:     Musculoskeletal: Strength & Muscle Tone: within normal limits Gait & Station: normal Patient leans: N/A  Psychiatric Specialty Exam: Review of Systems  Constitutional: Negative.   HENT: Negative.   Eyes: Negative.   Respiratory: Negative.   Cardiovascular: Negative.   Gastrointestinal: Negative.   Genitourinary: Negative.   Musculoskeletal: Negative.   Skin: Negative.   Neurological: Negative.   Endo/Heme/Allergies: Negative.   Psychiatric/Behavioral: Negative.     Blood pressure 116/61, pulse 62, temperature 98.8 F (37.1 C), temperature source Oral, resp. rate 18, height  (1.499 m), weight 54.432 kg (120 lb), last menstrual period 01/12/2016, SpO2 100 %.Body mass index is 24.22 kg/(m^2).  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 She is being hospitalized here before for depression. She has a history of cocaine use. In the emergency department she was found to be positive for cocaine and her alcohol level was 110. Patient is currently not connected to any mental health agency.  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  Tolerating well  Insomnia: Continue trazodone 100 mg by mouth daily at bedtime  Allergies: Continue Flonase daily  Nasal congestion: Continue pseudoephedrine 120 mg every 12 hours  Tobacco use disorder: continue  nicotine patch 21 mg  Precautions every 15 minute checks  Diet regular  Vital signs daily  Hospitalization status: voluntary  Discharge disposition: Will return home when stable  Discharge follow-up: will f/u with [facility]---intensive outpt substance abuse.  Plan to d/c home tomorrow  Labs: TSH, lipid panel and HBA1c wnl  Jimmy Footman 01/13/2016, 10:52 AM

## 2016-01-13 NOTE — [facility] Group Notes (Signed)
[facility] LCSW Group Therapy  01/13/2016 1:20 PM  Type of Therapy:  Group Therapy  Participation Level:  Active  Participation Quality:  Intrusive  Affect:  Excited  Cognitive:  Alert  Insight:  Limited  Engagement in Therapy:  Limited  Modes of Intervention:  Discussion, Education, Socialization and Support  Summary of Progress/Problems: Emotional Regulation: Patients will identify both negative and positive emotions. They will discuss emotions they have difficulty regulating and how they impact their lives. Patients will be asked to identify healthy coping skills to combat unhealthy reactions to negative emotions.  Arbell attended group and stayed the entire time. She discussed feeling angry and saying things she does not mean. She states she either lashes out or shuts down when she feels overwhelmed.   Parneet Glantz L Khaled Herda MSW, LCSWA  01/13/2016, 1:20 PM

## 2016-01-13 NOTE — Progress Notes (Signed)
D: Patient voice of wanting to leave , and going to group today   Patient stated slept good last night .Stated appetite is good and energy level  Is normal. Stated concentration is good . Stated on Depression scale 8 , hopeless 8 and anxiety 8 .( low 0-10 high) Denies suicidal  homicidal ideations  .  No auditory hallucinations  No pain concerns . Appropriate ADL'S. Interacting with peers and staff.  A: Encourage patient participation with unit programming . Instruction  Given on  Medication , verbalize understanding. R: Voice no other concerns. Staff continue to monitor

## 2016-01-13 NOTE — Plan of Care (Signed)
Problem: Ineffective individual coping Goal: STG: Patient will remain free from self harm Outcome: Not Met (add Reason) Calm and cooperative. Med compliant. S/e and adverse reactions discussed. Questions encouraged. C/o pain. Tylenol 650 mg po given PRN for pain. Trazodone 100 mg po given PRN for sleep. q 15 min checks maintained. Pt remains free from harm.

## 2016-01-13 NOTE — Progress Notes (Signed)
Calm and cooperative. Attends group. Med compliant. C/o hand pain. Tylenol 650 mg and trazodone 100 mg given po PRN as ordered. Denies SI/HI/AV/H. No behavior problems noted. Slept 7.15 hours

## 2016-01-13 NOTE — Progress Notes (Signed)
Recreation Therapy Notes  Date: 01.19.17 Time: 3:00 pm Location: Craft Room  Group Topic: Leisure Education  Goal Area(s) Addresses:  Patient will identify activities for each letter of the alphabet. Patient will verbalize ability to integrate positive leisure into life post d/c. Patient will verbalize ability to use leisure as a Associate Professor.  Behavioral Response: Did not attend   Intervention: Leisure Alphabet  Activity: Patients were given a Leisure Information systems manager and instructed to list healthy leisure activities for each letter of the alphabet.  Education: LRT educated patients on what they needed to participate in leisure.  Education Outcome: Patient did not attend group.   Clinical Observations/Feedback: Patient did not attend group.  Jacquelynn Cree, LRT/CTRS 01/13/2016 4:11 PM

## 2016-01-13 NOTE — [facility] Suicide Risk Assessment (Signed)
[facility] Discharge Suicide Risk Assessment   Principal Problem: Severe recurrent major depression without psychotic features [facility]) Discharge Diagnoses:  Patient Active Problem List   Diagnosis Date Noted  . Tobacco use disorder [F17.200] 01/12/2016  . Stimulant use disorder (HCC) (cocaine) [F15.90] 01/12/2016  . Alcohol use disorder, moderate, dependence (HCC) [F10.20] 01/12/2016  . Self-inflicted laceration of wrist [S61.519A] 01/11/2016  . Severe recurrent major depression without psychotic features (HCC) [F33.2] 01/11/2016    Total Time spent with patient: 30 minutes   Psychiatric Specialty Exam: ROS                                                         Mental Status Per Nursing Assessment::   On Admission:  Self-harm behaviors  Demographic Factors:  Low socioeconomic status, Living alone and Unemployed  Loss Factors: Decrease in vocational status, Loss of significant relationship and Financial problems/change in socioeconomic status  Historical Factors: Impulsivity  Risk Reduction Factors:   Sense of responsibility to family  Continued Clinical Symptoms:  Alcohol/Substance Abuse/Dependencies Previous Psychiatric Diagnoses and Treatments  Cognitive Features That Contribute To Risk:  Closed-mindedness    Suicide Risk:  Minimal: No identifiable suicidal ideation.  Patients presenting with no risk factors but with morbid ruminations; may be classified as minimal risk based on the severity of the depressive symptoms  Follow-up Information    Follow up with [facility].   Why:  Please arrive to the walk-in clinic for an assessment for medication managment and therapy between the hours of 8am-2:30pm.  Arrive as early as possible for prompt service.  Please call Unk Pinto at [phone] for questions and assistance.   Contact information:   [address] [place] [address] [phone] Fax: [phone]       Jimmy Footman, MD 01/14/2016, 8:53 AM

## 2016-01-13 NOTE — Discharge Summary (Signed)
Physician Discharge Summary Note  Patient:  [name] is an 30 y.o., female MRN:  [id number] DOB:  05-May-1986 Patient phone:  [phone] (home)  Patient address:   [place] [address],  Total Time spent with patient: 30 minutes  Date of Admission:  01/11/2016 Date of Discharge: 01/14/16  Reason for Admission:  suicidality  Principal Problem: Severe recurrent major depression without psychotic features [facility]) Discharge Diagnoses: Patient Active Problem List   Diagnosis Date Noted  . Tobacco use disorder [F17.200] 01/12/2016  . Stimulant use disorder ([place]) (cocaine) [F15.90] 01/12/2016  . Alcohol use disorder, moderate, dependence ([place]) [F10.20] 01/12/2016  . Self-inflicted laceration of wrist [S61.519A] 01/11/2016  . Severe recurrent major depression without psychotic features [facility]) [F33.2] 01/11/2016   History of Present Illness:  30 year old single African-American female from [place] who presented voluntarily to our emergency department after she had cut herself with a razor blade. Patient cut herself superficially on her left wrist. Patient denies this was a suicidal attempt. She says she cut herself because it helps her release tension.   Per the notes the patient was combative in the emergency department. Her alcohol level was 112 and her urine toxicology was positive for cocaine.  Patient is a limited historian. Patient tells me she feels fine. His denying now having suicidality, homicidality or auditory or visual hallucinations. Denies having problems with his sleep, appetite energy or concentration.  Says she is currently unemployed and has been unemployed for several months. She used to work as a Scientist, water quality at [employer]. She is living in a income base apartment by herself. When I ask her how she is supporting herself financially she is stated "by the grace of God". She denies receiving any other financial assistance.  Patient  tells me she is being pregnant 4 times she became pregnant with her first child at the age of 19. This child passed away around the age of 39 months. Per her description looks like the child suffer from respiratory complications and failure to thrive. Patient had her second child in 10th grade therefore she dropped out of the school. This child is now 98 years old. She has a 31-year-old and also 81 month old who are from the same father. None of the children live with her. Social services is involved with the case and the children lived with their fathers.   Patient tells me the children were removed because she tested positive for cocaine during her pregnancy.  Substance abuse history : Patient stated that she only drinks alcohol every other month denies that the use of alcohol is excessive. As far as cocaine she is states she uses powder cocaine very rarely. Per records on care everywhere patient does have a past history of cocaine use while she was follow-up during her last pregnancy . Patient says she smokes marijuana rarely her last use was a blunt 3 days ago.   Associated Signs/Symptoms: Depression Symptoms: depressed mood, (Hypo) Manic Symptoms: denies Anxiety Symptoms: denies Psychotic Symptoms: denies PTSD Symptoms: NA   Total Time spent with patient: 1 hour  Past Psychiatric History: Patient states she was hospitalized here when she was pregnant with her 37-year-old. Says she was here for depression. Denies any history of prior suicidal attempts. As far as self injury she says she used to cut herself as a child but denies doing these often.  She is currently not seen by a psychiatrist and is currently not on any psychotropics  Past Medical History: denies history of seizures, head trauma, or chronic medical conditions  Past Medical History  Diagnosis Date  . Depression   . History of suicide attempt   . History of substance abuse   . History of thyroid disease    . Thyroid disease     Past Surgical History  Procedure Laterality Date  . Cesarean section  2000, 2002, 2013   Family History:  Family History  Problem Relation Age of Onset  . Diabetes Mother    Family Psychiatric History: denies history of mental illness, suicide or substance abuse in her family   Social History: patient is single, never married, currently unemployed, lives by herself in an income based upon major in [place]. Patient has 2 children still youngest is 87 months old and the oldest is 31 year old of the children live with her. Social services involved in the case the children stay with their fathers. Patient says she gets to see her children every other weekend.  Denies ever having any trouble with the law . As far as her education the patient dropped out of school in the 10th grade as she was pregnant with her second child. Patient stated that during the school she attended regular classes.  Patient was raised by her mother, she was born and raised here in [place]. She lives with her grandmother after she dropped out of high school. Patient has a sister who is 77 years old. She has a fair relationship with her sister and she doesn't feel her mother is supportive .      Hospital Course:   30 year old female with depression and presented intoxicated and with self injuries to our emergency department. She is being hospitalized here before for depression. She has a history of cocaine use. In the emergency department she was found to be positive for cocaine and her alcohol level was 110. Patient is currently not connected to any mental health agency.  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 Tolerating well  Insomnia: Continue trazodone 100 mg by mouth daily at bedtime  Allergies: Continue Flonase daily  Tobacco use disorder: pt received nicotine patch 21  mg  Discharge disposition: Will return home today  Discharge follow-up: will f/u with [facility]---intensive outpt substance abuse.  Labs: TSH, lipid panel and HBA1c wnl  During her stay in the hospital the patient did not display any unsafe behaviors. She was not disruptive. She participated actively in programming. There was no need for seclusion, restraints or forced medications. The patient was cooperative, calm; she comply with medications.  On discharge the patient reported feeling much improved. Denied feeling hopeless, helpless or having excessive guilt. She denied suicidal ideation, homicidal ideation or having auditory or visual hallucinations. Her affect was bright and reactive. She denied major problems with his sleep, appetite, energy or concentration. She denied side effects from her medications and denied having any physical complaints.  Prior to discharge she met with [facility] peer support specialist. She appeared motivated for treatment and was planning on attending [facility] intensive outpatient substance abuse program.   Physical Findings: AIMS: Facial and Oral Movements Muscles of Facial Expression: None, normal Lips and Perioral Area: None, normal Jaw: None, normal Tongue: None, normal,Extremity Movements Upper (arms, wrists, hands, fingers): None, normal Lower (legs, knees, ankles, toes): None, normal, Trunk Movements Neck, shoulders, hips: None, normal, Overall Severity Severity of abnormal movements (highest score from questions above): None, normal  Incapacitation due to abnormal movements: None, normal Patient's awareness of abnormal movements (rate only patient's report): No Awareness, Dental Status Current problems with teeth and/or dentures?: No Does patient usually wear dentures?: No  CIWA:    COWS:     Musculoskeletal: Strength & Muscle Tone: within normal limits Gait & Station: normal Patient leans: N/A  Psychiatric Specialty Exam: Review of Systems   Constitutional: Negative.   HENT: Negative.   Eyes: Negative.   Respiratory: Negative.   Cardiovascular: Negative.   Gastrointestinal: Negative.   Genitourinary: Negative.   Musculoskeletal: Negative.   Skin: Negative.   Neurological: Negative.   Endo/Heme/Allergies: Negative.   Psychiatric/Behavioral: Negative.     Blood pressure 129/71, pulse 77, temperature 98.8 F (37.1 C), temperature source Oral, resp. rate 18, height 4' 11"  (1.499 m), weight 54.432 kg (120 lb), last menstrual period 01/12/2016, SpO2 100 %.Body mass index is 24.22 kg/(m^2).  General Appearance: Disheveled  Eye Contact::  Good  Speech:  Clear and Coherent  Volume:  Normal  Mood:  Euthymic  Affect:  Appropriate  Thought Process:  concrete  Orientation:  Full (Time, Place, and Person)  Thought Content:  Hallucinations: None  Suicidal Thoughts:  No  Homicidal Thoughts:  No  Memory:  Immediate;   Fair Recent;   Fair Remote;   Fair  Judgement:  Fair  Insight:  Fair  Psychomotor Activity:  Decreased  Concentration:  Fair  Recall:  Prospect of Knowledge:Fair  Language: Fair  Akathisia:  No  Handed:    AIMS (if indicated):     Assets:  Communication Skills Housing Physical Health  ADL's:  Intact  Cognition: WNL  Sleep:  Number of Hours: 7.5   Have you used any form of tobacco in the last 30 days? (Cigarettes, Smokeless Tobacco, Cigars, and/or Pipes): Yes  Has this patient used any form of tobacco in the last 30 days? (Cigarettes, Smokeless Tobacco, Cigars, and/or Pipes) Yes, Yes, A prescription for an FDA-approved tobacco cessation medication was offered at discharge and the patient refused  Metabolic Disorder Labs:  Lab Results  Component Value Date   HGBA1C 4.9 01/12/2016   No results found for: PROLACTIN Lab Results  Component Value Date   CHOL 188 01/12/2016   TRIG 119 01/12/2016   HDL 82 01/12/2016   CHOLHDL 2.3 01/12/2016   VLDL 24 01/12/2016   LDLCALC 82 01/12/2016   Results for  PAMA, ROSKOS (MRN [id number]) as of 01/13/2016 14:11  Ref. Range 01/11/2016 00:26 01/11/2016 01:41 01/12/2016 12:49  Sodium Latest Ref Range: 135-145 mmol/L  134 (L)   Potassium Latest Ref Range: 3.5-5.1 mmol/L  3.6   Chloride Latest Ref Range: 101-111 mmol/L  106   CO2 Latest Ref Range: 22-32 mmol/L  16 (L)   BUN Latest Ref Range: 6-20 mg/dL  9   Creatinine Latest Ref Range: 0.44-1.00 mg/dL  0.76   Calcium Latest Ref Range: 8.9-10.3 mg/dL  8.5 (L)   EGFR (Non-African Amer.) Latest Ref Range: >60 mL/min  >60   EGFR (African American) Latest Ref Range: >60 mL/min  >60   Glucose Latest Ref Range: 65-99 mg/dL  82   Anion gap Latest Ref Range: 5-15   12   Alkaline Phosphatase Latest Ref Range: 38-126 U/L  50   Albumin Latest Ref Range: 3.5-5.0 g/dL  4.4   AST Latest Ref Range: 15-41 U/L  14 (L)   ALT Latest Ref Range: 14-54 U/L  10 (L)   Total Protein Latest Ref Range: 6.5-8.1  g/dL  7.6   Total Bilirubin Latest Ref Range: 0.3-1.2 mg/dL  1.2   Cholesterol Latest Ref Range: 0-200 mg/dL   188  Triglycerides Latest Ref Range: <150 mg/dL   119  HDL Cholesterol Latest Ref Range: >40 mg/dL   82  LDL (calc) Latest Ref Range: 0-99 mg/dL   82  VLDL Latest Ref Range: 0-40 mg/dL   24  Total CHOL/HDL Ratio Latest Units: RATIO   2.3  WBC Latest Ref Range: 3.6-11.0 K/uL  9.2   RBC Latest Ref Range: 3.80-5.20 MIL/uL  4.43   Hemoglobin Latest Ref Range: 12.0-16.0 g/dL  13.0   HCT Latest Ref Range: 35.0-47.0 %  39.8   MCV Latest Ref Range: 80.0-100.0 fL  89.8   MCH Latest Ref Range: 26.0-34.0 pg  29.4   MCHC Latest Ref Range: 32.0-36.0 g/dL  32.7   RDW Latest Ref Range: 11.5-14.5 %  13.9   Platelets Latest Ref Range: 150-440 K/uL  197   Neutrophils Latest Units: %  37   Lymphocytes Latest Units: %  52   Monocytes Relative Latest Units: %  5   Eosinophil Latest Units: %  5   Basophil Latest Units: %  1   NEUT# Latest Ref Range: 1.4-6.5 K/uL  3.4   Lymphocyte # Latest Ref Range: 1.0-3.6 K/uL  4.9 (H)    Monocyte # Latest Ref Range: 0.2-0.9 K/uL  0.4   Eosinophils Absolute Latest Ref Range: 0-0.7 K/uL  0.4   Basophils Absolute Latest Ref Range: 0-0.1 K/uL  0.1   Hemoglobin A1C Latest Ref Range: 4.0-6.0 %   4.9  Preg Test, Ur Latest Ref Range: NEGATIVE  NEGATIVE    TSH Latest Ref Range: 0.450-4.500 uIU/mL   0.709  T4, Total Latest Ref Range: 4.5-12.0 ug/dL   5.2  Free Thyroxine Index Latest Ref Range: 1.2-4.9    1.7  T3 Uptake Ratio Latest Ref Range: 24-39 %   32  Appearance Latest Ref Range: CLEAR  CLEAR (A)    Bacteria, UA Latest Ref Range: NONE SEEN  NONE SEEN    Bilirubin Urine Latest Ref Range: NEGATIVE  NEGATIVE    Color, Urine Latest Ref Range: YELLOW  YELLOW (A)    Glucose Latest Ref Range: NEGATIVE mg/dL NEGATIVE    Hgb urine dipstick Latest Ref Range: NEGATIVE  3+ (A)    Ketones, ur Latest Ref Range: NEGATIVE mg/dL NEGATIVE    Leukocytes, UA Latest Ref Range: NEGATIVE  NEGATIVE    Nitrite Latest Ref Range: NEGATIVE  NEGATIVE    pH Latest Ref Range: 5.0-8.0  6.0    Protein Latest Ref Range: NEGATIVE mg/dL NEGATIVE    RBC / HPF Latest Ref Range: 0-5 RBC/hpf 6-30    Specific Gravity, Urine Latest Ref Range: 1.005-1.030  1.003 (L)    Squamous Epithelial / LPF Latest Ref Range: NONE SEEN  0-5 (A)    WBC, UA Latest Ref Range: 0-5 WBC/hpf 0-5    Alcohol, Ethyl (B) Latest Ref Range: <5 mg/dL  112 (H)   Amphetamines, Ur Screen Latest Ref Range: NONE DETECTED  NONE DETECTED    Barbiturates, Ur Screen Latest Ref Range: NONE DETECTED  NONE DETECTED    Benzodiazepine, Ur Scrn Latest Ref Range: NONE DETECTED  NONE DETECTED    Cocaine Metabolite,Ur [place] Latest Ref Range: NONE DETECTED  POSITIVE (A)    Methadone Scn, Ur Latest Ref Range: NONE DETECTED  NONE DETECTED    MDMA (Ecstasy)Ur Screen Latest Ref Range: NONE DETECTED  NONE DETECTED  Cannabinoid 50 Ng, Ur [place] Latest Ref Range: NONE DETECTED  NONE DETECTED    Opiate, Ur Screen Latest Ref Range: NONE DETECTED  NONE DETECTED     Phencyclidine (PCP) Ur S Latest Ref Range: NONE DETECTED  NONE DETECTED    Tricyclic, Ur Screen Latest Ref Range: NONE DETECTED  NONE DETECTED      See Psychiatric Specialty Exam and Suicide Risk Assessment completed by Attending Physician prior to discharge.  Discharge destination:  Home  Is patient on multiple antipsychotic therapies at discharge:  No   Has Patient had three or more failed trials of antipsychotic monotherapy by history:  No  Recommended Plan for Multiple Antipsychotic Therapies: NA     Medication List    TAKE these medications      Indication   citalopram 20 MG tablet  Commonly known as:  CELEXA  Take 1 tablet (20 mg total) by mouth daily.  Notes to Patient:  depression      fluticasone 50 MCG/ACT nasal spray  Commonly known as:  FLONASE  Place 1 spray into both nostrils daily.  Notes to Patient:  allergies      traZODone 100 MG tablet  Commonly known as:  DESYREL  Take 1 tablet (100 mg total) by mouth at bedtime as needed for sleep.  Notes to Patient:  insomnia        Follow-up Information    Follow up with [facility].   Why:  Please arrive to the walk-in clinic for an assessment for medication managment and therapy between the hours of 8am-2:30pm.  Arrive as early as possible for prompt service.  Please call Sherrian Divers at [phone] for questions and assistance.   Contact information:   [place] [address] [phone] Fax: [phone]       Signed: Hildred Priest 01/14/2016, 8:53 AM

## 2016-01-14 NOTE — Progress Notes (Signed)
  [facility] Adult Case Management Discharge Plan :  Will you be returning to the same living situation after discharge:  Yes,  pt will be returning home to her apartment in [place], [place] At discharge, do you have transportation home?: Yes,  pt will be picked up by her mom Do you have the ability to pay for your medications: Yes,  pt will be provided with precriptions at discharge  Release of information consent forms completed and in the chart;  Patient's signature needed at discharge.  Patient to Follow up at: Follow-up Information    Follow up with [facility].   Why:  Unk Pinto will pick you up at your home on Wednesday January 25th at 7am for an assessment at [facility] for medication managment and therapy.  Please call Unk Pinto at [phone] if you need to reschedule or for questions and assistance.   Contact information:   [address] [place] [address] [phone] Fax: [phone]      Next level of care provider has access to [facility] Link:no  Safety Planning and Suicide Prevention discussed: Yes,  completed with pt  Have you used any form of tobacco in the last 30 days? (Cigarettes, Smokeless Tobacco, Cigars, and/or Pipes): Yes  Has patient been referred to the Quitline?: Patient refused referral  Patient has been referred for addiction treatment: Yes  [name] 01/14/2016, 11:07 AM

## 2016-01-14 NOTE — Progress Notes (Signed)
Patient reported anxiety at the beginning of the shift but then stated it was getting better. Medication was given with education. Patient denies SI/HI/AVH at this time. She reports pain. Tylenol was given for this pain. Patient has been visible in the milieu interacting with other patients. She is bright on approach. Safety maintained with 15 min checks.

## 2016-01-14 NOTE — Tx Team (Signed)
Interdisciplinary Treatment Plan Update (Adult)        Date: 01/14/2016   Time Reviewed: 9:30 AM   Progress in Treatment: Improving  Attending groups: Yes Participating in groups: Yes Taking medication as prescribed: Yes  Tolerating medication: Yes  Family/Significant other contact made: No, pt refused Patient understands diagnosis: Yes  Discussing patient identified problems/goals with staff: Yes  Medical problems stabilized or resolved: Yes  Denies suicidal/homicidal ideation: Yes  Issues/concerns per patient self-inventory: Yes  Other:   New problem(s) identified: N/A   Discharge Plan or Barriers: Pt will return home to [facility] to her apartment to live and plans to follow up with outpatient services with [facility] for medication management, substance abuse intensive outpatient treatment and therapy.  Reason for Continuation of Hospitalization:   Depression   Anxiety   Medication Stabilization   Comments: N/A   Estimated date of discharge: 01/14/16    Pt is a 30 year old single African-American female from [place] who presented voluntarily to our emergency department after she had cut herself with a razor blade. Patient cut herself superficially on her left wrist. Patient denies this was a suicidal attempt. She says she cut herself because it helps her release tension. Per the notes the patient was combative in the emergency department. Her alcohol level was 112 and her urine toxicology was positive for cocaine. Patient is a limited historian. Patient tells me she feels fine. His denying now having suicidality, homicidality or auditory or visual hallucinations. Denies having problems with his sleep, appetite energy or concentration.  Says she is currently unemployed and has been unemployed for several months. She used to work as a Scientist, water quality at [employer]. She is living in a income base apartment by herself. When I ask her how she is  supporting herself financially she is stated "by the grace of God". She denies receiving any other financial assistance.  Patient tells me she is being pregnant 4 times she became pregnant with her first child at the age of 43. This child passed away around the age of 40 months. Per her description looks like the child suffer from respiratory complications and failure to thrive. Patient had her second child in 10th grade therefore she dropped out of the school. This child is now 36 years old. She has a 47-year-old and also 61 month old who are from the same father. None of the children live with her. Social services is involved with the case and the children lived with their fathers.  Patient tells me the children were removed because she tested positive for cocaine during her pregnancy.  Patient stated that she only drinks alcohol every other month denies that the use of alcohol is excessive. As far as cocaine she is states she uses powder cocaine very rarely. Per records on care everywhere patient does have a past history of cocaine use while she was follow-up during her last pregnancy . Patient says she smokes marijuana rarely her last use was a blunt 3 days ago. Patient lives in [place], [place]. Patient will benefit from crisis stabilization, medication evaluation, group therapy, and psycho education in addition to case management for discharge planning. Patient and CSW reviewed pt's identified goals and treatment plan. Pt verbalized understanding and agreed to treatment plan.    Review of initial/current patient goals per problem list:  1. Goal(s): Patient will participate in aftercare plan   Met: Yes  Target date: 3-5 days post admission date   As evidenced  by: Patient will participate within aftercare plan AEB aftercare provider and housing plan at discharge being identified.   1/19:  Pt will return home to [facility] to her apartment to live and plans to follow up with outpatient services with [facility] for medication management, substance abuse treatment and therapy.    2. Goal (s): Patient will exhibit decreased depressive symptoms and suicidal ideations.   Met: Adequate for discharge per MD.  Target date: 3-5 days post admission date   As evidenced by: Patient will utilize self-rating of depression at 3 or below and demonstrate decreased signs of depression or be deemed stable for discharge by MD.   1/19: Goal progressing.  Pt denies SI.  1/20: Adequate for discharge per MD.  Pt denies SI. Pt reports she is safe for discharge    3. Goal(s): Patient will demonstrate decreased signs and symptoms of anxiety.   Met: Adequate for discharge per MD.  Target date: 3-5 days post admission date   As evidenced by: Patient will utilize self-rating of anxiety at 3 or below and demonstrated decreased signs of anxiety, or be deemed stable for discharge by MD    1/19: Goal progressing. ` 1/20: Adequate for discharge per MD. Pt reports baseline symptoms of anxiety     4. Goal(s): Patient will demonstrate decreased signs of withdrawal due to substance abuse   Met: Yes  Target date: 3-5 days post admission date   As evidenced by: Patient will produce a CIWA/COWS score of 0, have stable vitals signs, and no symptoms of withdrawal   1/19: per medical charts patient has produced a CIWA/COWS score of 0, has stable vitals signs, and no symptoms of withdrawal    Attendees:  Patient:  Family:  Physician: Dr. Jerilee Hoh, MD    01/14/2016 9:30 AM  Nursing: Nash Mantis, RN     01/14/2016 9:30 AM  Clinical Social Worker: Marylou Flesher, [place]  01/14/2016 9:30 AM          01/14/2016 9:30 AM  Nursing:       01/14/2016 9:30 AM  Other:        01/14/2016 9:30 AM  Other:        01/14/2016 9:30 AM

## 2016-01-14 NOTE — Progress Notes (Signed)
Recreation Therapy Notes  INPATIENT RECREATION TR PLAN  Patient Details Name: [name] MRN: [id number] DOB: [birth year] Today's Date: 01/14/2016  Rec Therapy Plan Is patient appropriate for Therapeutic Recreation?: Yes Treatment times per week: At least once a week TR Treatment/Interventions: 1:1 session, Group participation (Comment) (Appropriate participation in daily recreation therapy tx)  Discharge Criteria Pt will be discharged from therapy if:: Treatment goals are met, Discharged Treatment plan/goals/alternatives discussed and agreed upon by:: Patient/family  Discharge Summary Short term goals set: See Care Plan Short term goals met: Complete Progress toward goals comments: One-to-one attended One-to-one attended: Coping skills Reason goals not met: N/A Therapeutic equipment acquired: None Reason patient discharged from therapy: Discharge from hospital Pt/family agrees with progress & goals achieved: Yes Date patient discharged from therapy: 01/14/16   Leonette Monarch, LRT/CTRS 01/14/2016, 12:08 PM

## 2016-01-14 NOTE — Progress Notes (Signed)
Pt denies SI/HI/AVH. Pt given discharge instructions including prescriptions and f/u appointments. Pt states understanding. Pt states receipt of all belongings. Patient ambulatory when leaving the unit.

## 2016-01-14 NOTE — Plan of Care (Signed)
Problem: [facility] Participation in Recreation Therapeutic Interventions Goal: STG-Patient will identify at least five coping skills for ** STG: Coping Skills - Within 3 treatment sessions, patient will verbalize at least 5 coping skills in one treatment session to increase healthy coping skills post d/c.  Outcome: Completed/Met Date Met:  01/14/16 Treatment Session 1; Completed 1 out of 1: At approximately 11:25 am, LRT met with patient in patient room. Patient verbalized 5 healthy coping skills. LRT encouraged patient to continue thinking of healthy coping skills. Intervention Used: Coping Skills worksheet  Leonette Monarch, LRT/CTRS 01.20.17 12:05 pm Goal: STG-Other Recreation Therapy Goal (Specify) STG: Coping Skills - Within 4 treatment sessions, patient will verbalize 5 coping skills for substance abuse in one treatment session to decrease substance abuse post d/c.  Outcome: Completed/Met Date Met:  01/14/16 Treatment Session 1; Completed 1 out of 1: At approximately 11:25 am, LRT met with patient in patient room. Patient verbalized 5 coping skills for substance abuse. LRT educated patient on leisure and why it is important to implement into her schedule. LRT educated and provided patient with schedules to help her plan her day and try to avoid using substances. LRT educated patient on healthy support systems. Intervention Used: Coping Skills worksheet  Leonette Monarch, LRT/CTRS 01.20.17 12:07 pm

## 2016-03-09 ENCOUNTER — Emergency Department
Admission: EM | Admit: 2016-03-09 | Discharge: 2016-03-09 | Disposition: A | Discharge status: Home / Self Care | Payer: Self-pay | Attending: Emergency Medicine | Admitting: Emergency Medicine

## 2016-03-09 ENCOUNTER — Encounter: Payer: Self-pay | Admitting: Emergency Medicine

## 2016-03-09 DIAGNOSIS — Z91010 Allergy to peanuts: Secondary | ICD-10-CM | POA: Insufficient documentation

## 2016-03-09 DIAGNOSIS — F332 Major depressive disorder, recurrent severe without psychotic features: Secondary | ICD-10-CM | POA: Insufficient documentation

## 2016-03-09 DIAGNOSIS — J029 Acute pharyngitis, unspecified: Secondary | ICD-10-CM | POA: Insufficient documentation

## 2016-03-09 DIAGNOSIS — F10259 Alcohol dependence with alcohol-induced psychotic disorder, unspecified: Secondary | ICD-10-CM | POA: Insufficient documentation

## 2016-03-09 DIAGNOSIS — F17210 Nicotine dependence, cigarettes, uncomplicated: Secondary | ICD-10-CM | POA: Insufficient documentation

## 2016-03-09 DIAGNOSIS — Z915 Personal history of self-harm: Secondary | ICD-10-CM | POA: Insufficient documentation

## 2016-03-09 DIAGNOSIS — F14159 Cocaine abuse with cocaine-induced psychotic disorder, unspecified: Secondary | ICD-10-CM | POA: Insufficient documentation

## 2016-03-09 DIAGNOSIS — Z79899 Other long term (current) drug therapy: Secondary | ICD-10-CM | POA: Insufficient documentation

## 2016-03-09 DIAGNOSIS — E079 Disorder of thyroid, unspecified: Secondary | ICD-10-CM | POA: Insufficient documentation

## 2016-03-09 MED ORDER — DEXAMETHASONE SODIUM PHOSPHATE 10 MG/ML IJ SOLN
10.0000 mg | Freq: Once | INTRAMUSCULAR | Status: AC
  Administered 2016-03-09: 10 mg via INTRAMUSCULAR

## 2016-03-09 MED ORDER — IBUPROFEN 600 MG PO TABS
600.0000 mg | ORAL_TABLET | Freq: Three times a day (TID) | ORAL | Status: DC | PRN
Start: 2016-03-09 — End: 2016-03-29

## 2016-03-09 MED ORDER — DEXAMETHASONE SODIUM PHOSPHATE 10 MG/ML IJ SOLN
INTRAMUSCULAR | Status: AC
  Filled 2016-03-09: qty 1

## 2016-03-09 NOTE — ED Notes (Signed)
NAD noted at time of D/C. Pt denies questions or concerns. Pt ambulatory to the lobby at this time.  

## 2016-03-09 NOTE — ED Notes (Signed)
Pt reports sore throat x4 days; reports it hurts to eat.

## 2016-03-09 NOTE — Discharge Instructions (Signed)
Please seek medical attention for any high fevers, chest pain, shortness of breath, change in behavior, persistent vomiting, bloody stool or any other new or concerning symptoms. ° ° ° °Pharyngitis °Pharyngitis is a sore throat (pharynx). There is redness, pain, and swelling of your throat. °HOME CARE  °· Drink enough fluids to keep your pee (urine) clear or pale yellow. °· Only take medicine as told by your doctor. °¨ You may get sick again if you do not take medicine as told. Finish your medicines, even if you start to feel better. °¨ Do not take aspirin. °· Rest. °· Rinse your mouth (gargle) with salt water (½ tsp of salt per 1 qt of water) every 1-2 hours. This will help the pain. °· If you are not at risk for choking, you can suck on hard candy or sore throat lozenges. °GET HELP IF: °· You have large, tender lumps on your neck. °· You have a rash. °· You cough up green, yellow-brown, or bloody spit. °GET HELP RIGHT AWAY IF:  °· You have a stiff neck. °· You drool or cannot swallow liquids. °· You throw up (vomit) or are not able to keep medicine or liquids down. °· You have very bad pain that does not go away with medicine. °· You have problems breathing (not from a stuffy nose). °MAKE SURE YOU:  °· Understand these instructions. °· Will watch your condition. °· Will get help right away if you are not doing well or get worse. °  °This information is not intended to replace advice given to you by your health care provider. Make sure you discuss any questions you have with your health care provider. °  °Document Released: 05/29/2008 Document Revised: 10/01/2013 Document Reviewed: 08/18/2013 °Elsevier Interactive Patient Education ©2016 Elsevier Inc. ° °

## 2016-03-09 NOTE — ED Notes (Signed)
Patient to room 37.  Alert and oriented.  Laughing with family.  States she has throat and chest pain, throat is worse.  8/10 on 0-10 scale. Voice is hoarse.
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
## 2016-03-29 ENCOUNTER — Emergency Department: Payer: Self-pay

## 2016-03-29 ENCOUNTER — Encounter: Payer: Self-pay | Admitting: Medical Oncology

## 2016-03-29 ENCOUNTER — Emergency Department
Admission: EM | Admit: 2016-03-29 | Discharge: 2016-03-29 | Disposition: A | Discharge status: Home / Self Care | Payer: Self-pay | Attending: Emergency Medicine | Admitting: Emergency Medicine

## 2016-03-29 DIAGNOSIS — Y999 Unspecified external cause status: Secondary | ICD-10-CM | POA: Insufficient documentation

## 2016-03-29 DIAGNOSIS — Y9389 Activity, other specified: Secondary | ICD-10-CM | POA: Insufficient documentation

## 2016-03-29 DIAGNOSIS — F17200 Nicotine dependence, unspecified, uncomplicated: Secondary | ICD-10-CM | POA: Insufficient documentation

## 2016-03-29 DIAGNOSIS — S60221A Contusion of right hand, initial encounter: Secondary | ICD-10-CM | POA: Insufficient documentation

## 2016-03-29 DIAGNOSIS — Z91010 Allergy to peanuts: Secondary | ICD-10-CM | POA: Insufficient documentation

## 2016-03-29 DIAGNOSIS — F329 Major depressive disorder, single episode, unspecified: Secondary | ICD-10-CM | POA: Insufficient documentation

## 2016-03-29 DIAGNOSIS — E079 Disorder of thyroid, unspecified: Secondary | ICD-10-CM | POA: Insufficient documentation

## 2016-03-29 DIAGNOSIS — Y929 Unspecified place or not applicable: Secondary | ICD-10-CM | POA: Insufficient documentation

## 2016-03-29 MED ORDER — HYDROCODONE-ACETAMINOPHEN 5-325 MG PO TABS
2.0000 | ORAL_TABLET | Freq: Once | ORAL | Status: AC
  Administered 2016-03-29: 2 via ORAL
  Filled 2016-03-29: qty 2

## 2016-03-29 MED ORDER — HYDROCODONE-ACETAMINOPHEN 5-325 MG PO TABS: 1.0000 | ORAL_TABLET | ORAL | Status: DC | PRN
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
## 2016-03-29 NOTE — Discharge Instructions (Signed)
Hand Contusion °A hand contusion is a deep bruise on your hand area. Contusions are the result of an injury that caused bleeding under the skin. The contusion may turn blue, purple, or yellow. Minor injuries will give you a painless contusion, but more severe contusions may stay painful and swollen for a few weeks. °CAUSES  °A contusion is usually caused by a blow, trauma, or direct force to an area of the body. °SYMPTOMS  °· Swelling and redness of the injured area. °· Discoloration of the injured area. °· Tenderness and soreness of the injured area. °· Pain. °DIAGNOSIS  °The diagnosis can be made by taking a history and performing a physical exam. An X-ray, CT scan, or MRI may be needed to determine if there were any associated injuries, such as broken bones (fractures). °TREATMENT  °Often, the best treatment for a hand contusion is resting, elevating, icing, and applying cold compresses to the injured area. Over-the-counter medicines may also be recommended for pain control. °HOME CARE INSTRUCTIONS  °· Put ice on the injured area. °· Put ice in a plastic bag. °· Place a towel between your skin and the bag. °· Leave the ice on for 15-20 minutes, 03-04 times a day. °· Only take over-the-counter or prescription medicines as directed by your caregiver. Your caregiver may recommend avoiding anti-inflammatory medicines (aspirin, ibuprofen, and naproxen) for 48 hours because these medicines may increase bruising. °· If told, use an elastic wrap as directed. This can help reduce swelling. You may remove the wrap for sleeping, showering, and bathing. If your fingers become numb, cold, or blue, take the wrap off and reapply it more loosely. °· Elevate your hand with pillows to reduce swelling. °· Avoid overusing your hand if it is painful. °SEEK IMMEDIATE MEDICAL CARE IF:  °· You have increased redness, swelling, or pain in your hand. °· Your swelling or pain is not relieved with medicines. °· You have loss of feeling in  your hand or are unable to move your fingers. °· Your hand turns cold or blue. °· You have pain when you move your fingers. °· Your hand becomes warm to the touch. °· Your contusion does not improve in 2 days. °MAKE SURE YOU:  °· Understand these instructions. °· Will watch your condition. °· Will get help right away if you are not doing well or get worse. °  °This information is not intended to replace advice given to you by your health care provider. Make sure you discuss any questions you have with your health care provider. °  °Document Released: 06/02/2002 Document Revised: 09/04/2012 Document Reviewed: 06/03/2012 °Elsevier Interactive Patient Education ©2016 Elsevier Inc. ° °Cryotherapy °Cryotherapy means treatment with cold. Ice or gel packs can be used to reduce both pain and swelling. Ice is the most helpful within the first 24 to 48 hours after an injury or flare-up from overusing a muscle or joint. Sprains, strains, spasms, burning pain, shooting pain, and aches can all be eased with ice. Ice can also be used when recovering from surgery. Ice is effective, has very few side effects, and is safe for most people to use. °PRECAUTIONS  °Ice is not a safe treatment option for people with: °· Raynaud phenomenon. This is a condition affecting small blood vessels in the extremities. Exposure to cold may cause your problems to return. °· Cold hypersensitivity. There are many forms of cold hypersensitivity, including: °¨ Cold urticaria. Red, itchy hives appear on the skin when the tissues begin to warm   after being iced. °¨ Cold erythema. This is a red, itchy rash caused by exposure to cold. °¨ Cold hemoglobinuria. Red blood cells break down when the tissues begin to warm after being iced. The hemoglobin that carry oxygen are passed into the urine because they cannot combine with blood proteins fast enough. °· Numbness or altered sensitivity in the area being iced. °If you have any of the following conditions, do  not use ice until you have discussed cryotherapy with your caregiver: °· Heart conditions, such as arrhythmia, angina, or chronic heart disease. °· High blood pressure. °· Healing wounds or open skin in the area being iced. °· Current infections. °· Rheumatoid arthritis. °· Poor circulation. °· Diabetes. °Ice slows the blood flow in the region it is applied. This is beneficial when trying to stop inflamed tissues from spreading irritating chemicals to surrounding tissues. However, if you expose your skin to cold temperatures for too long or without the proper protection, you can damage your skin or nerves. Watch for signs of skin damage due to cold. °HOME CARE INSTRUCTIONS °Follow these tips to use ice and cold packs safely. °· Place a dry or damp towel between the ice and skin. A damp towel will cool the skin more quickly, so you may need to shorten the time that the ice is used. °· For a more rapid response, add gentle compression to the ice. °· Ice for no more than 10 to 20 minutes at a time. The bonier the area you are icing, the less time it will take to get the benefits of ice. °· Check your skin after 5 minutes to make sure there are no signs of a poor response to cold or skin damage. °· Rest 20 minutes or more between uses. °· Once your skin is numb, you can end your treatment. You can test numbness by very lightly touching your skin. The touch should be so light that you do not see the skin dimple from the pressure of your fingertip. When using ice, most people will feel these normal sensations in this order: cold, burning, aching, and numbness. °· Do not use ice on someone who cannot communicate their responses to pain, such as small children or people with dementia. °HOW TO MAKE AN ICE PACK °Ice packs are the most common way to use ice therapy. Other methods include ice massage, ice baths, and cryosprays. Muscle creams that cause a cold, tingly feeling do not offer the same benefits that ice offers and  should not be used as a substitute unless recommended by your caregiver. °To make an ice pack, do one of the following: °· Place crushed ice or a bag of frozen vegetables in a sealable plastic bag. Squeeze out the excess air. Place this bag inside another plastic bag. Slide the bag into a pillowcase or place a damp towel between your skin and the bag. °· Mix 3 parts water with 1 part rubbing alcohol. Freeze the mixture in a sealable plastic bag. When you remove the mixture from the freezer, it will be slushy. Squeeze out the excess air. Place this bag inside another plastic bag. Slide the bag into a pillowcase or place a damp towel between your skin and the bag. °SEEK MEDICAL CARE IF: °· You develop white spots on your skin. This may give the skin a blotchy (mottled) appearance. °· Your skin turns blue or pale. °· Your skin becomes waxy or hard. °· Your swelling gets worse. °MAKE SURE YOU:  °· Understand   these instructions. °· Will watch your condition. °· Will get help right away if you are not doing well or get worse. °  °This information is not intended to replace advice given to you by your health care provider. Make sure you discuss any questions you have with your health care provider. °  °Document Released: 08/07/2011 Document Revised: 01/01/2015 Document Reviewed: 08/07/2011 °Elsevier Interactive Patient Education ©2016 Elsevier Inc. ° °
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
## 2016-05-17 ENCOUNTER — Emergency Department
Admission: EM | Admit: 2016-05-17 | Discharge: 2016-05-17 | Disposition: A | Discharge status: Home / Self Care | Payer: Self-pay | Attending: Emergency Medicine | Admitting: Emergency Medicine

## 2016-05-17 ENCOUNTER — Encounter: Payer: Self-pay | Admitting: Emergency Medicine

## 2016-05-17 DIAGNOSIS — Z79899 Other long term (current) drug therapy: Secondary | ICD-10-CM | POA: Insufficient documentation

## 2016-05-17 DIAGNOSIS — Z91010 Allergy to peanuts: Secondary | ICD-10-CM | POA: Insufficient documentation

## 2016-05-17 DIAGNOSIS — K2970 Gastritis, unspecified, without bleeding: Secondary | ICD-10-CM | POA: Insufficient documentation

## 2016-05-17 DIAGNOSIS — E079 Disorder of thyroid, unspecified: Secondary | ICD-10-CM | POA: Insufficient documentation

## 2016-05-17 DIAGNOSIS — Z91018 Allergy to other foods: Secondary | ICD-10-CM | POA: Insufficient documentation

## 2016-05-17 DIAGNOSIS — F322 Major depressive disorder, single episode, severe without psychotic features: Secondary | ICD-10-CM | POA: Insufficient documentation

## 2016-05-17 DIAGNOSIS — F17200 Nicotine dependence, unspecified, uncomplicated: Secondary | ICD-10-CM | POA: Insufficient documentation

## 2016-05-17 LAB — COMPREHENSIVE METABOLIC PANEL
ALT: 13 U/L — ABNORMAL LOW (ref 14–54)
ANION GAP: 8 (ref 5–15)
AST: 17 U/L (ref 15–41)
Albumin: 4.1 g/dL (ref 3.5–5.0)
Alkaline Phosphatase: 60 U/L (ref 38–126)
BUN: 9 mg/dL (ref 6–20)
CHLORIDE: 105 mmol/L (ref 101–111)
CO2: 23 mmol/L (ref 22–32)
Calcium: 8.7 mg/dL — ABNORMAL LOW (ref 8.9–10.3)
Creatinine, Ser: 0.64 mg/dL (ref 0.44–1.00)
Glucose, Bld: 87 mg/dL (ref 65–99)
POTASSIUM: 3.4 mmol/L — AB (ref 3.5–5.1)
Sodium: 136 mmol/L (ref 135–145)
TOTAL PROTEIN: 7.6 g/dL (ref 6.5–8.1)
Total Bilirubin: 0.9 mg/dL (ref 0.3–1.2)

## 2016-05-17 LAB — URINALYSIS COMPLETE WITH MICROSCOPIC (ARMC ONLY)
BACTERIA UA: NONE SEEN
BILIRUBIN URINE: NEGATIVE
Glucose, UA: NEGATIVE mg/dL
HGB URINE DIPSTICK: NEGATIVE
Ketones, ur: NEGATIVE mg/dL
LEUKOCYTES UA: NEGATIVE
NITRITE: NEGATIVE
PH: 5 (ref 5.0–8.0)
Protein, ur: NEGATIVE mg/dL
SPECIFIC GRAVITY, URINE: 1.023 (ref 1.005–1.030)

## 2016-05-17 LAB — CBC
HEMATOCRIT: 40.8 % (ref 35.0–47.0)
Hemoglobin: 13.7 g/dL (ref 12.0–16.0)
MCH: 29.7 pg (ref 26.0–34.0)
MCHC: 33.7 g/dL (ref 32.0–36.0)
MCV: 88.3 fL (ref 80.0–100.0)
Platelets: 151 10*3/uL (ref 150–440)
RBC: 4.62 MIL/uL (ref 3.80–5.20)
RDW: 13.5 % (ref 11.5–14.5)
WBC: 7.2 10*3/uL (ref 3.6–11.0)

## 2016-05-17 LAB — HCG, QUANTITATIVE, PREGNANCY

## 2016-05-17 LAB — LIPASE, BLOOD: LIPASE: 18 U/L (ref 11–51)

## 2016-05-17 LAB — POCT PREGNANCY, URINE
PREG TEST UR: NEGATIVE
Preg Test, Ur: NEGATIVE

## 2016-05-17 MED ORDER — GI COCKTAIL ~~LOC~~
30.0000 mL | Freq: Once | ORAL | Status: AC
  Administered 2016-05-17: 30 mL via ORAL

## 2016-05-17 MED ORDER — GI COCKTAIL ~~LOC~~
ORAL | Status: AC
  Administered 2016-05-17: 30 mL via ORAL
  Filled 2016-05-17: qty 30

## 2016-05-17 MED ORDER — ONDANSETRON HCL 4 MG PO TABS: 4.0000 mg | ORAL_TABLET | Freq: Three times a day (TID) | ORAL | Status: DC | PRN

## 2016-05-17 MED ORDER — ONDANSETRON 4 MG PO TBDP
ORAL_TABLET | ORAL | Status: AC
  Administered 2016-05-17: 4 mg via ORAL
  Filled 2016-05-17: qty 1

## 2016-05-17 MED ORDER — PANTOPRAZOLE SODIUM 20 MG PO TBEC: 20.0000 mg | DELAYED_RELEASE_TABLET | Freq: Every day | ORAL | Status: DC

## 2016-05-17 MED ORDER — TRAMADOL HCL 50 MG PO TABS
50.0000 mg | ORAL_TABLET | Freq: Once | ORAL | Status: AC
  Administered 2016-05-17: 50 mg via ORAL

## 2016-05-17 MED ORDER — ONDANSETRON 4 MG PO TBDP
4.0000 mg | ORAL_TABLET | Freq: Once | ORAL | Status: AC
  Administered 2016-05-17: 4 mg via ORAL

## 2016-05-17 MED ORDER — TRAMADOL HCL 50 MG PO TABS
ORAL_TABLET | ORAL | Status: AC
  Administered 2016-05-17: 50 mg via ORAL
  Filled 2016-05-17: qty 1

## 2016-05-17 MED ORDER — TRAMADOL HCL 50 MG PO TABS: 50.0000 mg | ORAL_TABLET | Freq: Four times a day (QID) | ORAL | Status: DC | PRN

## 2016-05-17 NOTE — ED Provider Notes (Signed)
Time Seen: Approximately 1500  I have reviewed the triage notes  Chief Complaint: Abdominal Pain   History of Present Illness: [name] is a 30 y.o. female *who presents with a feeling of epigastric abdominal pain now for the last couple of days. She states she had some nausea and vomited 4. She thought that she might be pregnant since she hasn't had a menstrual period for the last 2 months. She denies any hematemesis or biliary emesis and points primarily to the epigastric area as the source of discomfort. She denies any lower abdominal pain. She denies any dysuria, hematuria, urinary frequency, melena, hematochezia.   Past Medical History  Diagnosis Date  . Depression   . History of suicide attempt   . History of substance abuse   . History of thyroid disease   . Thyroid disease     Patient Active Problem List   Diagnosis Date Noted  . Tobacco use disorder 01/12/2016  . Stimulant use disorder (HCC) (cocaine) 01/12/2016  . Alcohol use disorder, moderate, dependence (HCC) 01/12/2016  . Self-inflicted laceration of wrist 01/11/2016  . Severe recurrent major depression without psychotic features (HCC) 01/11/2016    Past Surgical History  Procedure Laterality Date  . Cesarean section  2000, 2002, 2013    Past Surgical History  Procedure Laterality Date  . Cesarean section  2000, 2002, 2013    Current Outpatient Rx  Name  Route  Sig  Dispense  Refill  . citalopram (CELEXA) 20 MG tablet   Oral   Take 1 tablet (20 mg total) by mouth daily.   30 tablet   0   . fluticasone (FLONASE) 50 MCG/ACT nasal spray   Each Nare   Place 1 spray into both nostrils daily.         Marland Kitchen. HYDROcodone-acetaminophen (NORCO) 5-325 MG tablet   Oral   Take 1-2 tablets by mouth every 4 (four) hours as needed for moderate pain.   10 tablet   0   . ondansetron (ZOFRAN) 4 MG tablet   Oral   Take 1 tablet (4 mg total) by mouth every 8 (eight) hours as needed for nausea or vomiting.  21 tablet   0   . pantoprazole (PROTONIX) 20 MG tablet   Oral   Take 1 tablet (20 mg total) by mouth daily.   30 tablet   1   . traMADol (ULTRAM) 50 MG tablet   Oral   Take 1 tablet (50 mg total) by mouth every 6 (six) hours as needed.   20 tablet   0   . traZODone (DESYREL) 100 MG tablet   Oral   Take 1 tablet (100 mg total) by mouth at bedtime as needed for sleep.   30 tablet   0     Allergies:  Asa; Banana; Peanut-containing drug products; and Pecan nut (diagnostic)  Family History: Family History  Problem Relation Age of Onset  . Diabetes Mother     Social History: Social History  Substance Use Topics  . Smoking status: Current Every Day Smoker -- 0.50 packs/day  . Smokeless tobacco: Never Used  . Alcohol Use: No     Review of Systems:   10 point review of systems was performed and was otherwise negative:  Constitutional: No fever Eyes: No visual disturbances ENT: No sore throat, ear painShe does describe some sinus congestion Cardiac: No chest pain Respiratory: No shortness of breath, wheezing, or stridor Abdomen: No abdominal pain, no vomiting, No diarrhea Endocrine:  No weight loss, No night sweats Extremities: No peripheral edema, cyanosis Skin: No rashes, easy bruising Neurologic: No focal weakness, trouble with speech or swollowing Urologic: No dysuria, Hematuria, or urinary frequency   Physical Exam:  ED Triage Vitals  Enc Vitals Group     BP 05/17/16 1230 133/77 mmHg     Pulse Rate 05/17/16 1230 77     Resp 05/17/16 1230 18     Temp 05/17/16 1230 98.2 F (36.8 C)     Temp Source 05/17/16 1230 Oral     SpO2 05/17/16 1230 99 %     Weight 05/17/16 1230 130 lb (58.968 kg)     Height 05/17/16 1230  (1.499 m)     Head Cir --      Peak Flow --      Pain Score 05/17/16 1234 10     Pain Loc --      Pain Edu? --      Excl. in GC? --     General: Awake , Alert , and Oriented times 3; GCS 15 Head: Normal cephalic , atraumatic Eyes:  Pupils equal , round, reactive to light Nose/Throat: No nasal drainage, patent upper airway without erythema or exudate.  Neck: Supple, Full range of motion, No anterior adenopathy or palpable thyroid masses Lungs: Clear to ascultation without wheezes , rhonchi, or rales Heart: Regular rate, regular rhythm without murmurs , gallops , or rubs Abdomen: Pain reproducible in the epigastric area without rebound guarding or rigidity. No reproducible lower abdominal pain below the umbilicus. Negative Murphy's sign. Negative tenderness over McBurney's point. No obvious right upper quadrant abdominal pain.     Extremities: 2 plus symmetric pulses. No edema, clubbing or cyanosis Neurologic: normal ambulation, Motor symmetric without deficits, sensory intact Skin: warm, dry, no rashes   Labs:   All laboratory work was reviewed including any pertinent negatives or positives listed below:  Labs Reviewed  COMPREHENSIVE METABOLIC PANEL - Abnormal; Notable for the following:    Potassium 3.4 (*)    Calcium 8.7 (*)    ALT 13 (*)    All other components within normal limits  URINALYSIS COMPLETEWITH MICROSCOPIC ([facility] ONLY) - Abnormal; Notable for the following:    Color, Urine YELLOW (*)    APPearance CLEAR (*)    Squamous Epithelial / LPF 0-5 (*)    All other components within normal limits  LIPASE, BLOOD  CBC  HCG, QUANTITATIVE, PREGNANCY  POC URINE PREG, ED  POCT PREGNANCY, URINE  POCT PREGNANCY, URINE  Laboratory work was reviewed and showed no clinically significant abnormalities.    ED Course:  I felt given the patient's presentation this was unlikely to be an acute surgical issue and she'll be treated for epigastric abdominal pain. She does not appear to have pancreatitis, abnormal liver functions, elevated white blood cell count, fever, etc. Patient I felt did not require any radiologic studies at this time. She was given a GI cocktail, Ultram by mouth 1 and some by mouth Zofran with  symptomatic improvement. Be discharged on a similar course of medication including Ultram, Zofran, and prescription Protonix. The patient was encouraged to follow up with a primary physician. She states she does not have a local physician and was referred to the [facility].    Assessment: * Acute unspecified epigastric abdominal pain   Final Clinical Impression:   Final diagnoses:  Gastritis     Plan: Outpatient management Patient was advised to return immediately if condition worsens. Patient  was advised to follow up with their primary care physician or other specialized physicians involved in their outpatient care. The patient and/or family member/power of attorney had laboratory results reviewed at the bedside. All questions and concerns were addressed and appropriate discharge instructions were distributed by the nursing staff.            Jennye Moccasin, MD 05/17/16 [phone]

## 2016-05-17 NOTE — Discharge Instructions (Signed)
Gastritis, Adult Gastritis is soreness and puffiness (inflammation) of the lining of the stomach. If you do not get help, gastritis can cause bleeding and sores (ulcers) in the stomach. HOME CARE   Only take medicine as told by your doctor.  If you were given antibiotic medicines, take them as told. Finish the medicines even if you start to feel better.  Drink enough fluids to keep your pee (urine) clear or pale yellow.  Avoid foods and drinks that make your problems worse. Foods you may want to avoid include:  Caffeine or alcohol.  Chocolate.  Mint.  Garlic and onions.  Spicy foods.  Citrus fruits, including oranges, lemons, or limes.  Food containing tomatoes, including sauce, chili, salsa, and pizza.  Fried and fatty foods.  Eat small meals throughout the day instead of large meals. GET HELP RIGHT AWAY IF:   You have black or dark red poop (stools).  You throw up (vomit) blood. It may look like coffee grounds.  You cannot keep fluids down.  Your belly (abdominal) pain gets worse.  You have a fever.  You do not feel better after 1 week.  You have any other questions or concerns. MAKE SURE YOU:   Understand these instructions.  Will watch your condition.  Will get help right away if you are not doing well or get worse.   This information is not intended to replace advice given to you by your health care provider. Make sure you discuss any questions you have with your health care provider.   Document Released: 05/29/2008 Document Revised: 03/04/2012 Document Reviewed: 01/24/2012 Elsevier Interactive Patient Education [employer]2016 [employer].  Please return immediately if condition worsens. Please contact her primary physician or the physician you were given for referral. If you have any specialist physicians involved in her treatment and plan please also contact them. Thank you for using [facility].

## 2016-05-17 NOTE — ED Notes (Addendum)
Pt reports that she doesn't feel good and hasn't for a couple of days. Pt reports epigastric and lower abdominal pain since yesterday. Pt reports emesis x 4. NAD noted. Pt reports that she might be pregnant and has not had a period in 2 months.

## 2016-07-05 ENCOUNTER — Emergency Department
Admission: EM | Admit: 2016-07-05 | Discharge: 2016-07-05 | Disposition: A | Discharge status: Home / Self Care | Payer: Medicaid Other | Attending: Emergency Medicine | Admitting: Emergency Medicine

## 2016-07-05 ENCOUNTER — Emergency Department: Payer: Medicaid Other

## 2016-07-05 ENCOUNTER — Encounter: Payer: Self-pay | Admitting: Emergency Medicine

## 2016-07-05 DIAGNOSIS — Y939 Activity, unspecified: Secondary | ICD-10-CM | POA: Diagnosis not present

## 2016-07-05 DIAGNOSIS — F332 Major depressive disorder, recurrent severe without psychotic features: Secondary | ICD-10-CM | POA: Insufficient documentation

## 2016-07-05 DIAGNOSIS — X58XXXA Exposure to other specified factors, initial encounter: Secondary | ICD-10-CM | POA: Insufficient documentation

## 2016-07-05 DIAGNOSIS — F17200 Nicotine dependence, unspecified, uncomplicated: Secondary | ICD-10-CM | POA: Insufficient documentation

## 2016-07-05 DIAGNOSIS — Y999 Unspecified external cause status: Secondary | ICD-10-CM | POA: Insufficient documentation

## 2016-07-05 DIAGNOSIS — S46912A Strain of unspecified muscle, fascia and tendon at shoulder and upper arm level, left arm, initial encounter: Secondary | ICD-10-CM | POA: Diagnosis not present

## 2016-07-05 DIAGNOSIS — Y929 Unspecified place or not applicable: Secondary | ICD-10-CM | POA: Insufficient documentation

## 2016-07-05 DIAGNOSIS — S4992XA Unspecified injury of left shoulder and upper arm, initial encounter: Secondary | ICD-10-CM | POA: Diagnosis present

## 2016-07-05 MED ORDER — IBUPROFEN 800 MG PO TABS: 800.0000 mg | ORAL_TABLET | Freq: Three times a day (TID) | ORAL | Status: DC | PRN

## 2016-07-05 MED ORDER — HYDROCODONE-ACETAMINOPHEN 5-325 MG PO TABS: 1.0000 | ORAL_TABLET | ORAL | Status: DC | PRN
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
## 2016-07-05 NOTE — Discharge Instructions (Signed)
Take pain medicine as directed. Begin exercises to keep from getting a frozen shoulder. Follow-up with the orthopedics week if not improving.

## 2016-07-27 ENCOUNTER — Encounter: Payer: Self-pay | Admitting: Emergency Medicine

## 2016-07-27 ENCOUNTER — Emergency Department
Admission: EM | Admit: 2016-07-27 | Discharge: 2016-07-27 | Disposition: A | Discharge status: Home / Self Care | Payer: Medicaid Other | Attending: Emergency Medicine | Admitting: Emergency Medicine

## 2016-07-27 ENCOUNTER — Emergency Department: Payer: Medicaid Other

## 2016-07-27 DIAGNOSIS — Y999 Unspecified external cause status: Secondary | ICD-10-CM | POA: Insufficient documentation

## 2016-07-27 DIAGNOSIS — Y929 Unspecified place or not applicable: Secondary | ICD-10-CM | POA: Insufficient documentation

## 2016-07-27 DIAGNOSIS — X58XXXA Exposure to other specified factors, initial encounter: Secondary | ICD-10-CM | POA: Diagnosis not present

## 2016-07-27 DIAGNOSIS — Y9389 Activity, other specified: Secondary | ICD-10-CM | POA: Diagnosis not present

## 2016-07-27 DIAGNOSIS — F17200 Nicotine dependence, unspecified, uncomplicated: Secondary | ICD-10-CM | POA: Diagnosis not present

## 2016-07-27 DIAGNOSIS — S60221A Contusion of right hand, initial encounter: Secondary | ICD-10-CM | POA: Diagnosis not present

## 2016-07-27 DIAGNOSIS — M79641 Pain in right hand: Secondary | ICD-10-CM | POA: Diagnosis present

## 2016-07-27 MED ORDER — ETODOLAC 500 MG PO TABS: 500.0000 mg | ORAL_TABLET | Freq: Two times a day (BID) | ORAL | 0 refills | Status: DC
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
## 2016-07-27 NOTE — ED Notes (Signed)
Patient declined Rx for etodoloc.  PA notified and script voided and shredded.

## 2016-07-27 NOTE — ED Notes (Signed)
Patient stated "Yall didn't do anything to help my pain".  Informed the patient of sending her home with Rx to help alleviate this.  Patient continued to decline medication and left without any of her discharge paperwork.
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
## 2016-09-09 ENCOUNTER — Emergency Department
Admission: EM | Admit: 2016-09-09 | Discharge: 2016-09-09 | Disposition: A | Discharge status: Home / Self Care | Payer: Medicaid Other | Attending: Emergency Medicine | Admitting: Emergency Medicine

## 2016-09-09 ENCOUNTER — Encounter: Payer: Self-pay | Admitting: Emergency Medicine

## 2016-09-09 ENCOUNTER — Emergency Department: Payer: Medicaid Other

## 2016-09-09 DIAGNOSIS — S91114A Laceration without foreign body of right lesser toe(s) without damage to nail, initial encounter: Secondary | ICD-10-CM | POA: Diagnosis not present

## 2016-09-09 DIAGNOSIS — F17200 Nicotine dependence, unspecified, uncomplicated: Secondary | ICD-10-CM | POA: Diagnosis not present

## 2016-09-09 DIAGNOSIS — S8392XA Sprain of unspecified site of left knee, initial encounter: Secondary | ICD-10-CM | POA: Insufficient documentation

## 2016-09-09 DIAGNOSIS — I10 Essential (primary) hypertension: Secondary | ICD-10-CM | POA: Insufficient documentation

## 2016-09-09 DIAGNOSIS — Y9389 Activity, other specified: Secondary | ICD-10-CM | POA: Insufficient documentation

## 2016-09-09 DIAGNOSIS — T07XXXA Unspecified multiple injuries, initial encounter: Secondary | ICD-10-CM

## 2016-09-09 DIAGNOSIS — Z638 Other specified problems related to primary support group: Secondary | ICD-10-CM | POA: Diagnosis not present

## 2016-09-09 DIAGNOSIS — S8391XA Sprain of unspecified site of right knee, initial encounter: Secondary | ICD-10-CM | POA: Diagnosis not present

## 2016-09-09 DIAGNOSIS — Y92018 Other place in single-family (private) house as the place of occurrence of the external cause: Secondary | ICD-10-CM | POA: Insufficient documentation

## 2016-09-09 DIAGNOSIS — Z91010 Allergy to peanuts: Secondary | ICD-10-CM | POA: Diagnosis not present

## 2016-09-09 DIAGNOSIS — S8990XA Unspecified injury of unspecified lower leg, initial encounter: Secondary | ICD-10-CM | POA: Diagnosis present

## 2016-09-09 DIAGNOSIS — S60511A Abrasion of right hand, initial encounter: Secondary | ICD-10-CM | POA: Diagnosis not present

## 2016-09-09 DIAGNOSIS — Y999 Unspecified external cause status: Secondary | ICD-10-CM | POA: Diagnosis not present

## 2016-09-09 DIAGNOSIS — S8390XA Sprain of unspecified site of unspecified knee, initial encounter: Secondary | ICD-10-CM

## 2016-09-09 HISTORY — DX: Essential (primary) hypertension: I10

## 2016-09-09 MED ORDER — BACITRACIN ZINC 500 UNIT/GM EX OINT
TOPICAL_OINTMENT | Freq: Two times a day (BID) | CUTANEOUS | Status: DC
  Administered 2016-09-09: 1 via TOPICAL
  Filled 2016-09-09: qty 0.9

## 2016-09-09 MED ORDER — CEPHALEXIN 500 MG PO CAPS: 500.0000 mg | ORAL_CAPSULE | Freq: Two times a day (BID) | ORAL | 0 refills | Status: DC

## 2016-09-09 MED ORDER — HYDROCODONE-ACETAMINOPHEN 5-325 MG PO TABS
1.0000 | ORAL_TABLET | ORAL | 0 refills | Status: DC | PRN
Start: 2016-09-09 — End: 2018-01-04

## 2016-09-09 MED ORDER — HYDROCODONE-ACETAMINOPHEN 5-325 MG PO TABS
1.0000 | ORAL_TABLET | Freq: Once | ORAL | Status: AC
  Administered 2016-09-09: 1 via ORAL
  Filled 2016-09-09: qty 1

## 2016-09-09 NOTE — ED Notes (Signed)
Pt's knees cleaned, bacitracin ointment placed as well as non-adjerent gauze and dry gauze then wrapped with kerlex.
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
## 2016-09-09 NOTE — ED Notes (Signed)
Pt given phone to call sister for ride.
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
## 2016-09-09 NOTE — Discharge Instructions (Signed)
Continue to clean wounds with soap and water. He can apply Vaseline to keep the area moist. Take a few days of antibiotics as directed. Take pain medicine as needed. Follow-up with your physician or return to emergency room for any concerns.

## 2016-10-18 ENCOUNTER — Emergency Department
Admission: EM | Admit: 2016-10-18 | Discharge: 2016-10-18 | Disposition: A | Discharge status: Home / Self Care | Payer: Medicaid Other | Attending: Emergency Medicine | Admitting: Emergency Medicine

## 2016-10-18 ENCOUNTER — Encounter: Payer: Self-pay | Admitting: Emergency Medicine

## 2016-10-18 DIAGNOSIS — Z791 Long term (current) use of non-steroidal anti-inflammatories (NSAID): Secondary | ICD-10-CM | POA: Diagnosis not present

## 2016-10-18 DIAGNOSIS — I10 Essential (primary) hypertension: Secondary | ICD-10-CM | POA: Diagnosis not present

## 2016-10-18 DIAGNOSIS — F17200 Nicotine dependence, unspecified, uncomplicated: Secondary | ICD-10-CM | POA: Diagnosis not present

## 2016-10-18 DIAGNOSIS — R202 Paresthesia of skin: Secondary | ICD-10-CM | POA: Diagnosis not present

## 2016-10-18 DIAGNOSIS — Z79899 Other long term (current) drug therapy: Secondary | ICD-10-CM | POA: Diagnosis not present

## 2016-10-18 DIAGNOSIS — R200 Anesthesia of skin: Secondary | ICD-10-CM | POA: Diagnosis present

## 2016-10-18 LAB — POCT PREGNANCY, URINE: Preg Test, Ur: NEGATIVE

## 2016-10-18 LAB — URINALYSIS COMPLETE WITH MICROSCOPIC (ARMC ONLY)
BACTERIA UA: NONE SEEN
BILIRUBIN URINE: NEGATIVE
Glucose, UA: NEGATIVE mg/dL
HGB URINE DIPSTICK: NEGATIVE
Ketones, ur: NEGATIVE mg/dL
LEUKOCYTES UA: NEGATIVE
Nitrite: NEGATIVE
PH: 5 (ref 5.0–8.0)
PROTEIN: NEGATIVE mg/dL
SQUAMOUS EPITHELIAL / LPF: NONE SEEN
Specific Gravity, Urine: 1.023 (ref 1.005–1.030)

## 2016-10-18 LAB — CBC
HEMATOCRIT: 40.1 % (ref 35.0–47.0)
HEMOGLOBIN: 13.6 g/dL (ref 12.0–16.0)
MCH: 30.4 pg (ref 26.0–34.0)
MCHC: 34 g/dL (ref 32.0–36.0)
MCV: 89.3 fL (ref 80.0–100.0)
Platelets: 152 10*3/uL (ref 150–440)
RBC: 4.48 MIL/uL (ref 3.80–5.20)
RDW: 13.6 % (ref 11.5–14.5)
WBC: 5.4 10*3/uL (ref 3.6–11.0)

## 2016-10-18 LAB — BASIC METABOLIC PANEL
Anion gap: 7 (ref 5–15)
BUN: 8 mg/dL (ref 6–20)
CHLORIDE: 107 mmol/L (ref 101–111)
CO2: 25 mmol/L (ref 22–32)
Calcium: 9.1 mg/dL (ref 8.9–10.3)
Creatinine, Ser: 0.6 mg/dL (ref 0.44–1.00)
GFR calc non Af Amer: >60 mL/min (ref >60)
Glucose, Bld: 101 mg/dL — ABNORMAL HIGH (ref 65–99)
POTASSIUM: 4.8 mmol/L (ref 3.5–5.1)
Sodium: 139 mmol/L (ref 135–145)

## 2016-10-18 MED ORDER — CLONIDINE HCL 0.1 MG PO TABS: 0.1000 mg | ORAL_TABLET | Freq: Two times a day (BID) | ORAL | 1 refills | Status: DC

## 2016-10-18 NOTE — ED Provider Notes (Signed)
Time Seen: Approximately 1513  I have reviewed the triage notes  Chief Complaint: Numbness   History of Present Illness: [name] is a 30 y.o. female who states she's been diagnosed with hypertension while she was in jail. She's noticed increase in her blood pressure at home and describes a systolic pressure of 170. She states she had a growing concern when she woke up this morning her noticed earlier last night some left arm numbness. She states that she had a hard time squeezing with her left hand. She denies any other focal neurologic symptoms such as blurred vision, loss of vision, trouble with speech, trouble with swallowing. She states her symptoms have improved. She denies any lower extremity weakness or numbness. Further questioning the patient does not seem to have any weakness at all just a numbness sensation mainly starting at the base of her left side of her neck down into the left arm. She denies any chest pain, shortness of breath, peripheral edema, or any other concerns.   Past Medical History:  Diagnosis Date  . Depression   . History of substance abuse   . History of suicide attempt   . History of thyroid disease   . Hypertension   . Thyroid disease     Patient Active Problem List   Diagnosis Date Noted  . Tobacco use disorder 01/12/2016  . Stimulant use disorder (HCC) (cocaine) 01/12/2016  . Alcohol use disorder, moderate, dependence (HCC) 01/12/2016  . Self-inflicted laceration of wrist 01/11/2016  . Severe recurrent major depression without psychotic features (HCC) 01/11/2016    Past Surgical History:  Procedure Laterality Date  . CESAREAN SECTION  2000, 2002, 2013    Past Surgical History:  Procedure Laterality Date  . CESAREAN SECTION  2000, 2002, 2013    Current Outpatient Rx  . Order #: [id number] Class: Print  . Order #: [id number] Class: Print  . Order #: [id number] Class: Print  . Order #: [id number] Class: Print  . Order #: [id number] Class:  Historical Med  . Order #: [id number] Class: Print  . Order #: [id number] Class: Print  . Order #: [id number] Class: Print  . Order #: [id number] Class: Print  . Order #: [id number] Class: Print  . Order #: [id number] Class: Print    Allergies:  Asa [aspirin]; Banana; Peanut-containing drug products; and Pecan nut (diagnostic)  Family History: Family History  Problem Relation Age of Onset  . Diabetes Mother     Social History: Social History  Substance Use Topics  . Smoking status: Current Every Day Smoker    Packs/day: 0.50  . Smokeless tobacco: Never Used  . Alcohol use Yes     Comment: social drinker     Review of Systems:   10 point review of systems was performed and was otherwise negative:  Constitutional: No fever Eyes: No visual disturbances ENT: No sore throat, ear pain Cardiac: No chest pain Respiratory: No shortness of breath, wheezing, or stridor Abdomen: No abdominal pain, no vomiting, No diarrhea Endocrine: No weight loss, No night sweats Extremities: No peripheral edema, cyanosis Skin: No rashes, easy bruising Neurologic: No focal weakness, trouble with speech or swollowing Urologic: No dysuria, Hematuria, or urinary frequency   Physical Exam:  ED Triage Vitals [10/18/16 1330]  Enc Vitals Group     BP 125/68     Pulse Rate 63     Resp 19     Temp 97.8 F (36.6 C)     Temp Source Oral     SpO2 100 %  Weight 132 lb (59.9 kg)     Height 4\' 11"  (1.499 m)     Head Circumference      Peak Flow      Pain Score 10     Pain Loc      Pain Edu?      Excl. in GC?     General: Awake , Alert , and Oriented times 3; GCS 15 Head: Normal cephalic , atraumatic Eyes: Pupils equal , round, reactive to light Nose/Throat: No nasal drainage, patent upper airway without erythema or exudate.  Neck: Supple, Full range of motion, No anterior adenopathy or palpable thyroid masses Lungs: Clear to ascultation without wheezes , rhonchi, or rales Heart: Regular rate,  regular rhythm without murmurs , gallops , or rubs Abdomen: Soft, non tender without rebound, guarding , or rigidity; bowel sounds positive and symmetric in all 4 quadrants. No organomegaly .        Extremities: 2 plus symmetric pulses. No edema, clubbing or cyanosis Neurologic: normal ambulation, Motor symmetric without deficits, sensory intact Skin: warm, dry, no rashes   Labs:   All laboratory work was reviewed including any pertinent negatives or positives listed below:  Labs Reviewed  BASIC METABOLIC PANEL - Abnormal; Notable for the following:       Result Value   Glucose, Bld 101 (*)    All other components within normal limits  URINALYSIS COMPLETEWITH MICROSCOPIC ([facility] ONLY) - Abnormal; Notable for the following:    Color, Urine YELLOW (*)    APPearance CLEAR (*)    All other components within normal limits  CBC  POCT PREGNANCY, URINE  POC URINE PREG, ED  Laboratory work was reviewed and showed no clinically significant abnormalities.   EKG:  ED ECG REPORT I, Jennye Moccasin, the attending physician, personally viewed and interpreted this ECG.  Date: 10/18/2016 EKG Time: 1332 Rate: 61 Rhythm: normal sinus rhythm QRS Axis: normal Intervals: normal ST/T Wave abnormalities: normal Conduction Disturbances: none Narrative Interpretation: unremarkable Criteria for left ventricular hypertrophy Repolarization without any acute ischemic changes    ED Course: Patient's stay here was uneventful and she's otherwise hemodynamically stable and has no focal neurologic deficits on exam either from a motor standpoint or sensory standpoint. I felt given her age this was unlikely to be an acute cerebrovascular accident or transient ischemic attack. She may have some hypertensive urgency. The patient is not aware of what her previous medication-wise. She states she was started on medication in prison she's been out 3 months and has not spent to a local clinic, etc. Patient will be  started on clonidine twice a day and is advised to follow up with primary care. She was referred to the health department and/[facility] Clinical Course     Assessment:  Left arm paresthesias Hypertensive urgency   Final Clinical Impression:   Final diagnoses:  Paresthesia     Plan:  Outpatient " Discharge Medication List as of 10/18/2016  4:11 PM    START taking these medications   Details  cloNIDine (CATAPRES) 0.1 MG tablet Take 1 tablet (0.1 mg total) by mouth 2 (two) times daily., Starting Wed 10/18/2016, Print      " Patient was advised to return immediately if condition worsens. Patient was advised to follow up with their primary care physician or other specialized physicians involved in their outpatient care. The patient and/or family member/power of attorney had laboratory results reviewed at the bedside. All questions and concerns were addressed and appropriate  discharge instructions were distributed by the nursing staff.            Jennye MoccasinBrian S Quigley, MD 10/18/16 [id number]

## 2016-10-18 NOTE — ED Notes (Signed)
Spoke with Dr Mayford KnifeWilliams about pt's complaints and current condition, no new orders at this time.

## 2016-10-18 NOTE — ED Triage Notes (Signed)
Pt reports high blood pressure and left arm numbness since 0300. Pt reports she's "unable to squeeze anything" with left hand. Pt otherwise neurologically intact.

## 2017-03-20 ENCOUNTER — Encounter: Payer: Self-pay | Admitting: Emergency Medicine

## 2017-03-20 ENCOUNTER — Emergency Department
Admission: EM | Admit: 2017-03-20 | Discharge: 2017-03-20 | Disposition: A | Discharge status: Home / Self Care | Payer: Medicaid Other | Attending: Emergency Medicine | Admitting: Emergency Medicine

## 2017-03-20 DIAGNOSIS — I10 Essential (primary) hypertension: Secondary | ICD-10-CM | POA: Insufficient documentation

## 2017-03-20 DIAGNOSIS — K649 Unspecified hemorrhoids: Secondary | ICD-10-CM | POA: Insufficient documentation

## 2017-03-20 DIAGNOSIS — F17200 Nicotine dependence, unspecified, uncomplicated: Secondary | ICD-10-CM | POA: Insufficient documentation

## 2017-03-20 DIAGNOSIS — Z79899 Other long term (current) drug therapy: Secondary | ICD-10-CM | POA: Insufficient documentation

## 2017-03-20 DIAGNOSIS — A599 Trichomoniasis, unspecified: Secondary | ICD-10-CM

## 2017-03-20 DIAGNOSIS — Z791 Long term (current) use of non-steroidal anti-inflammatories (NSAID): Secondary | ICD-10-CM | POA: Insufficient documentation

## 2017-03-20 DIAGNOSIS — K625 Hemorrhage of anus and rectum: Secondary | ICD-10-CM

## 2017-03-20 LAB — CBC
HCT: 39.2 % (ref 35.0–47.0)
Hemoglobin: 13.2 g/dL (ref 12.0–16.0)
MCH: 30.2 pg (ref 26.0–34.0)
MCHC: 33.8 g/dL (ref 32.0–36.0)
MCV: 89.4 fL (ref 80.0–100.0)
PLATELETS: 195 10*3/uL (ref 150–440)
RBC: 4.38 MIL/uL (ref 3.80–5.20)
RDW: 13.7 % (ref 11.5–14.5)
WBC: 6.1 10*3/uL (ref 3.6–11.0)

## 2017-03-20 LAB — URINALYSIS, COMPLETE (UACMP) WITH MICROSCOPIC
BILIRUBIN URINE: NEGATIVE
Bacteria, UA: NONE SEEN
Glucose, UA: NEGATIVE mg/dL
Hgb urine dipstick: NEGATIVE
Ketones, ur: NEGATIVE mg/dL
LEUKOCYTES UA: NEGATIVE
Nitrite: NEGATIVE
PH: 6 (ref 5.0–8.0)
Protein, ur: NEGATIVE mg/dL
SPECIFIC GRAVITY, URINE: 1.021 (ref 1.005–1.030)

## 2017-03-20 LAB — CHLAMYDIA/NGC RT PCR (ARMC ONLY)
Chlamydia Tr: DETECTED — AB
N gonorrhoeae: DETECTED — AB

## 2017-03-20 LAB — WET PREP, GENITAL
Clue Cells Wet Prep HPF POC: NONE SEEN
SPERM: NONE SEEN
YEAST WET PREP: NONE SEEN

## 2017-03-20 LAB — COMPREHENSIVE METABOLIC PANEL
ALK PHOS: 52 U/L (ref 38–126)
ALT: 11 U/L — AB (ref 14–54)
AST: 14 U/L — ABNORMAL LOW (ref 15–41)
Albumin: 4.2 g/dL (ref 3.5–5.0)
Anion gap: 8 (ref 5–15)
BILIRUBIN TOTAL: 0.6 mg/dL (ref 0.3–1.2)
BUN: 14 mg/dL (ref 6–20)
CALCIUM: 9.3 mg/dL (ref 8.9–10.3)
CHLORIDE: 106 mmol/L (ref 101–111)
CO2: 25 mmol/L (ref 22–32)
CREATININE: 0.57 mg/dL (ref 0.44–1.00)
GFR calc Af Amer: >60 mL/min (ref >60)
Glucose, Bld: 107 mg/dL — ABNORMAL HIGH (ref 65–99)
Potassium: 4.3 mmol/L (ref 3.5–5.1)
Sodium: 139 mmol/L (ref 135–145)
TOTAL PROTEIN: 7.5 g/dL (ref 6.5–8.1)

## 2017-03-20 LAB — RAPID HIV SCREEN (HIV 1/2 AB+AG)
HIV 1/2 Antibodies: NONREACTIVE
HIV-1 P24 Antigen - HIV24: NONREACTIVE

## 2017-03-20 LAB — LIPASE, BLOOD: Lipase: 21 U/L (ref 11–51)

## 2017-03-20 LAB — POCT PREGNANCY, URINE: Preg Test, Ur: NEGATIVE

## 2017-03-20 MED ORDER — ACETAMINOPHEN 500 MG PO TABS
1000.0000 mg | ORAL_TABLET | Freq: Once | ORAL | Status: AC
  Administered 2017-03-20: 1000 mg via ORAL

## 2017-03-20 MED ORDER — ACETAMINOPHEN 500 MG PO TABS: 1000.0000 mg | ORAL_TABLET | Freq: Once | ORAL | Status: DC

## 2017-03-20 MED ORDER — ACETAMINOPHEN 500 MG PO TABS
ORAL_TABLET | ORAL | Status: AC
  Filled 2017-03-20: qty 2

## 2017-03-20 MED ORDER — METRONIDAZOLE 500 MG PO TABS: 500.0000 mg | ORAL_TABLET | Freq: Two times a day (BID) | ORAL | 0 refills | Status: AC

## 2017-03-20 NOTE — Discharge Instructions (Signed)
Please seek medical attention for any high fevers, chest pain, shortness of breath, change in behavior, persistent vomiting, bloody stool or any other new or concerning symptoms.  

## 2017-03-20 NOTE — ED Provider Notes (Signed)
[facility] Emergency Department Provider Note   ____________________________________________   I have reviewed the triage vital signs and the nursing notes.   HISTORY  Chief Complaint Abdominal Pain and Rectal Bleeding   History limited by: Not Limited   HPI [name] is a 31 y.o. female who presents to the emergency department today with multiple complaints. Primary complaint is for rectal bleeding. Says been going on for the past month. She has noticed blood when she has had bowel movements. States she does have a history of hemorrhoids. Denies any significant rectal plain. In addition she has had some foul vaginal odors. She is concerned for STD exposure.   Past Medical History:  Diagnosis Date  . Depression   . History of substance abuse   . History of suicide attempt   . History of thyroid disease   . Hypertension   . Thyroid disease     Patient Active Problem List   Diagnosis Date Noted  . Tobacco use disorder 01/12/2016  . Stimulant use disorder (HCC) (cocaine) 01/12/2016  . Alcohol use disorder, moderate, dependence (HCC) 01/12/2016  . Self-inflicted laceration of wrist 01/11/2016  . Severe recurrent major depression without psychotic features (HCC) 01/11/2016    Past Surgical History:  Procedure Laterality Date  . CESAREAN SECTION  2000, 2002, 2013    Prior to Admission medications   Medication Sig Start Date End Date Taking? Authorizing Provider  cephALEXin (KEFLEX) 500 MG capsule Take 1 capsule (500 mg total) by mouth 2 (two) times daily. 09/09/16   Ignacia Bayleyobert Tumey, PA-C  citalopram (CELEXA) 20 MG tablet Take 1 tablet (20 mg total) by mouth daily. 01/13/16   Jimmy FootmanAndrea Hernandez-Gonzalez, MD  cloNIDine (CATAPRES) 0.1 MG tablet Take 1 tablet (0.1 mg total) by mouth 2 (two) times daily. 10/18/16   Jennye MoccasinBrian S Quigley, MD  etodolac (LODINE) 500 MG tablet Take 1 tablet (500 mg total) by mouth 2 (two) times daily. 07/27/16   Delorise RoyalsJonathan D Cuthriell,  PA-C  fluticasone (FLONASE) 50 MCG/ACT nasal spray Place 1 spray into both nostrils daily.    Historical Provider, MD  HYDROcodone-acetaminophen (NORCO) 5-325 MG tablet Take 1 tablet by mouth every 4 (four) hours as needed for moderate pain. 09/09/16   Ignacia Bayleyobert Tumey, PA-C  ibuprofen (ADVIL,MOTRIN) 800 MG tablet Take 1 tablet (800 mg total) by mouth every 8 (eight) hours as needed. 07/05/16   Ignacia Bayleyobert Tumey, PA-C  ondansetron (ZOFRAN) 4 MG tablet Take 1 tablet (4 mg total) by mouth every 8 (eight) hours as needed for nausea or vomiting. 05/17/16   Jennye MoccasinBrian S Quigley, MD  pantoprazole (PROTONIX) 20 MG tablet Take 1 tablet (20 mg total) by mouth daily. 05/17/16 05/17/17  Jennye MoccasinBrian S Quigley, MD  traMADol (ULTRAM) 50 MG tablet Take 1 tablet (50 mg total) by mouth every 6 (six) hours as needed. 05/17/16   Jennye MoccasinBrian S Quigley, MD  traZODone (DESYREL) 100 MG tablet Take 1 tablet (100 mg total) by mouth at bedtime as needed for sleep. 01/13/16   Jimmy FootmanAndrea Hernandez-Gonzalez, MD    Allergies Asa [aspirin]; Banana; Peanut-containing drug products; and Pecan nut (diagnostic)  Family History  Problem Relation Age of Onset  . Diabetes Mother     Social History Social History  Substance Use Topics  . Smoking status: Current Every Day Smoker    Packs/day: 0.35  . Smokeless tobacco: Never Used  . Alcohol use Yes     Comment: social drinker    Review of Systems  Constitutional: Negative for  fever. Cardiovascular: Negative for chest pain. Respiratory: Negative for shortness of breath. Gastrointestinal: Positive for rectal bleeding. Genitourinary: Positive for foul vaginal odor. Neurological: Negative for headaches, focal weakness or numbness.  10-point ROS otherwise negative.  ____________________________________________   PHYSICAL EXAM:  VITAL SIGNS: ED Triage Vitals [03/20/17 1637]  Enc Vitals Group     BP (!) 137/97     Pulse Rate 81     Resp 20     Temp 97.7 F (36.5 C)     Temp Source Oral     SpO2  98 %     Weight 132 lb (59.9 kg)     Height 4\' 11"  (1.499 m)     Head Circumference      Peak Flow      Pain Score 8     Pain Loc      Pain Edu?      Excl. in GC?      Constitutional: Alert and oriented. Well appearing and in no distress. Eyes: Conjunctivae are normal. Normal extraocular movements. ENT   Head: Normocephalic and atraumatic.   Nose: No congestion/rhinnorhea.   Mouth/Throat: Mucous membranes are moist.   Neck: No stridor. Hematological/Lymphatic/Immunilogical: No cervical lymphadenopathy. Cardiovascular: Normal rate, regular rhythm.  No murmurs, rubs, or gallops.  Respiratory: Normal respiratory effort without tachypnea nor retractions. Breath sounds are clear and equal bilaterally. No wheezes/rales/rhonchi. Gastrointestinal: Soft and non tender. No rebound. No guarding.  Genitourinary: No external lesions. Moderate amount of discharge in vaginal vault. No CMT. No adnexal tenderness or fullness.  Musculoskeletal: Normal range of motion in all extremities. No lower extremity edema. Neurologic:  Normal speech and language. No gross focal neurologic deficits are appreciated.  Skin:  Skin is warm, dry and intact. No rash noted. Psychiatric: Mood and affect are normal. Speech and behavior are normal. Patient exhibits appropriate insight and judgment.  ____________________________________________    LABS (pertinent positives/negatives)  Labs Reviewed  COMPREHENSIVE METABOLIC PANEL - Abnormal; Notable for the following:       Result Value   Glucose, Bld 107 (*)    AST 14 (*)    ALT 11 (*)    All other components within normal limits  URINALYSIS, COMPLETE (UACMP) WITH MICROSCOPIC - Abnormal; Notable for the following:    Color, Urine YELLOW (*)    APPearance CLEAR (*)    Squamous Epithelial / LPF 0-5 (*)    All other components within normal limits  LIPASE, BLOOD  CBC  POCT PREGNANCY, URINE  POC URINE PREG, ED      ____________________________________________   EKG  None  ____________________________________________    RADIOLOGY  None  ____________________________________________   PROCEDURES  Procedures  ____________________________________________   INITIAL IMPRESSION / ASSESSMENT AND PLAN / ED COURSE  Pertinent labs & imaging results that were available during my care of the patient were reviewed by me and considered in my medical decision making (see chart for details).  Patient presented to the emergency department today with multiple complaints. Patient was found to be trichomoniasis positive. Patient will be given antibiotics for this. Terms of the rectal bleeding think likely secondary to patient's hemorrhoids. Will give GI follow-up.  ____________________________________________   FINAL CLINICAL IMPRESSION(S) / ED DIAGNOSES  Final diagnoses:  Rectal bleeding  Hemorrhoids, unspecified hemorrhoid type  Trichimoniasis     Note: This dictation was prepared with Dragon dictation. Any transcriptional errors that result from this process are unintentional     Phineas Semen, MD 03/20/17 2029
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
## 2017-03-21 ENCOUNTER — Telehealth: Payer: Self-pay | Admitting: Emergency Medicine

## 2017-03-21 NOTE — Telephone Encounter (Signed)
Called patient to inform of positive chlamydia and gonorrhea tests and need for treatment.  Patient will go to [facility] for treatment.

## 2017-04-18 ENCOUNTER — Ambulatory Visit: Payer: Medicaid Other | Admitting: Gastroenterology

## 2017-04-18 ENCOUNTER — Encounter: Payer: Self-pay | Admitting: Gastroenterology

## 2017-04-18 NOTE — Progress Notes (Deleted)
She has been referred by Dr Derrill Kay who saw Ms Feeley for rectal bleeding on 03/20/17 . At the same time she tested positive for Chlamydia and gonorrhea .   Rectal bleeding :  Onset and where was blood seen  :*** Frequency of bowel movements :*** Consistency : *** Change in shape of stool:*** Pain associated with bowel movements:*** Blood thinner usage:*** NSAID's: *** Prior colonoscopy :*** Family history of colon cancer or polyps:*** Weight loss:*** Rectal trauma/injury : ***  I have discussed alternative options, risks & benefits,  which include, but are not limited to, bleeding, infection, perforation,respiratory complication & drug reaction.  The patient agrees with this plan & written consent will be obtained.

## 2017-09-21 ENCOUNTER — Encounter: Payer: Self-pay | Admitting: Emergency Medicine

## 2017-09-21 DIAGNOSIS — O10011 Pre-existing essential hypertension complicating pregnancy, first trimester: Secondary | ICD-10-CM | POA: Insufficient documentation

## 2017-09-21 DIAGNOSIS — Z3A01 Less than 8 weeks gestation of pregnancy: Secondary | ICD-10-CM | POA: Insufficient documentation

## 2017-09-21 DIAGNOSIS — R102 Pelvic and perineal pain: Secondary | ICD-10-CM | POA: Insufficient documentation

## 2017-09-21 DIAGNOSIS — F17200 Nicotine dependence, unspecified, uncomplicated: Secondary | ICD-10-CM | POA: Insufficient documentation

## 2017-09-21 DIAGNOSIS — O99331 Smoking (tobacco) complicating pregnancy, first trimester: Secondary | ICD-10-CM | POA: Insufficient documentation

## 2017-09-21 DIAGNOSIS — O26891 Other specified pregnancy related conditions, first trimester: Secondary | ICD-10-CM | POA: Insufficient documentation

## 2017-09-21 DIAGNOSIS — Z91010 Allergy to peanuts: Secondary | ICD-10-CM | POA: Insufficient documentation

## 2017-09-21 DIAGNOSIS — Z79899 Other long term (current) drug therapy: Secondary | ICD-10-CM | POA: Insufficient documentation

## 2017-09-21 DIAGNOSIS — B9689 Other specified bacterial agents as the cause of diseases classified elsewhere: Secondary | ICD-10-CM | POA: Insufficient documentation

## 2017-09-21 DIAGNOSIS — O23599 Infection of other part of genital tract in pregnancy, unspecified trimester: Secondary | ICD-10-CM | POA: Insufficient documentation

## 2017-09-21 LAB — URINALYSIS, COMPLETE (UACMP) WITH MICROSCOPIC
BACTERIA UA: NONE SEEN
BILIRUBIN URINE: NEGATIVE
GLUCOSE, UA: NEGATIVE mg/dL
HGB URINE DIPSTICK: NEGATIVE
Ketones, ur: NEGATIVE mg/dL
LEUKOCYTES UA: NEGATIVE
NITRITE: NEGATIVE
PROTEIN: NEGATIVE mg/dL
RBC / HPF: NONE SEEN RBC/hpf (ref 0–5)
SPECIFIC GRAVITY, URINE: 1.019 (ref 1.005–1.030)
pH: 6 (ref 5.0–8.0)

## 2017-09-21 LAB — COMPREHENSIVE METABOLIC PANEL
ALT: 9 U/L — ABNORMAL LOW (ref 14–54)
ANION GAP: 11 (ref 5–15)
AST: 16 U/L (ref 15–41)
Albumin: 3.8 g/dL (ref 3.5–5.0)
Alkaline Phosphatase: 50 U/L (ref 38–126)
BILIRUBIN TOTAL: 0.2 mg/dL — AB (ref 0.3–1.2)
BUN: 8 mg/dL (ref 6–20)
CHLORIDE: 104 mmol/L (ref 101–111)
CO2: 22 mmol/L (ref 22–32)
Calcium: 9.1 mg/dL (ref 8.9–10.3)
Creatinine, Ser: 0.6 mg/dL (ref 0.44–1.00)
Glucose, Bld: 141 mg/dL — ABNORMAL HIGH (ref 65–99)
POTASSIUM: 3.9 mmol/L (ref 3.5–5.1)
Sodium: 137 mmol/L (ref 135–145)
TOTAL PROTEIN: 7 g/dL (ref 6.5–8.1)

## 2017-09-21 LAB — CBC
HEMATOCRIT: 36.8 % (ref 35.0–47.0)
Hemoglobin: 12.6 g/dL (ref 12.0–16.0)
MCH: 29.6 pg (ref 26.0–34.0)
MCHC: 34.3 g/dL (ref 32.0–36.0)
MCV: 86.4 fL (ref 80.0–100.0)
Platelets: 164 10*3/uL (ref 150–440)
RBC: 4.26 MIL/uL (ref 3.80–5.20)
RDW: 13.2 % (ref 11.5–14.5)
WBC: 8.6 10*3/uL (ref 3.6–11.0)

## 2017-09-21 LAB — LIPASE, BLOOD: LIPASE: 24 U/L (ref 11–51)

## 2017-09-21 LAB — POCT PREGNANCY, URINE: Preg Test, Ur: POSITIVE — AB

## 2017-09-21 NOTE — ED Triage Notes (Addendum)
Pt ambulatory to triage with steady gait, no distress noted. Pt c/o lower abdominal pain x2 days. Pt denies N/V/D. Per pt, she is approximately [redacted] weeks pregnant.

## 2017-09-22 ENCOUNTER — Emergency Department: Payer: Self-pay

## 2017-09-22 ENCOUNTER — Emergency Department
Admission: EM | Admit: 2017-09-22 | Discharge: 2017-09-22 | Disposition: A | Discharge status: Home / Self Care | Payer: Self-pay | Attending: Emergency Medicine | Admitting: Emergency Medicine

## 2017-09-22 DIAGNOSIS — B9689 Other specified bacterial agents as the cause of diseases classified elsewhere: Secondary | ICD-10-CM

## 2017-09-22 DIAGNOSIS — N760 Acute vaginitis: Secondary | ICD-10-CM

## 2017-09-22 DIAGNOSIS — Z3491 Encounter for supervision of normal pregnancy, unspecified, first trimester: Secondary | ICD-10-CM

## 2017-09-22 DIAGNOSIS — R102 Pelvic and perineal pain unspecified side: Secondary | ICD-10-CM

## 2017-09-22 LAB — CHLAMYDIA/NGC RT PCR (ARMC ONLY)
Chlamydia Tr: NOT DETECTED
N GONORRHOEAE: NOT DETECTED

## 2017-09-22 LAB — WET PREP, GENITAL
SPERM: NONE SEEN
TRICH WET PREP: NONE SEEN
Yeast Wet Prep HPF POC: NONE SEEN

## 2017-09-22 LAB — HCG, QUANTITATIVE, PREGNANCY: HCG, BETA CHAIN, QUANT, S: 65941 m[IU]/mL — AB (ref <5)

## 2017-09-22 MED ORDER — METRONIDAZOLE 0.75 % VA GEL: 1.0000 | Freq: Every day | VAGINAL | 0 refills | Status: AC

## 2017-09-22 NOTE — Discharge Instructions (Signed)
1. Use [facility] as directed, nightly 5 nights. 2. Return to the ER for worsening symptoms, persistent vomiting, vaginal bleeding, difficulty breathing or other concerns.

## 2017-09-22 NOTE — ED Provider Notes (Signed)
[facility] Emergency Department Provider Note   ____________________________________________   First MD Initiated Contact with Patient 09/22/17 [phone]     (approximate)  I have reviewed the triage vital signs and the nursing notes.   HISTORY  Chief Complaint Abdominal Pain    HPI [name] is a 31 y.o. female G5P4 approximately [redacted] weeks pregnant by dates who presents to the ED from home with a chief complaint of pelvic pain. Patient reports a 2 day history of pelvic pain, right greater than the left. Last sexual intercourse 3 days ago. Denies vaginal bleeding, nausea, vomiting or diarrhea. Denies fever, chills, chest pain, shortness of breath. Denies recent travel or trauma.   Past Medical History:  Diagnosis Date  . Depression   . History of substance abuse   . History of suicide attempt   . History of thyroid disease   . Hypertension   . Thyroid disease     Patient Active Problem List   Diagnosis Date Noted  . Tobacco use disorder 01/12/2016  . Stimulant use disorder (HCC) (cocaine) 01/12/2016  . Alcohol use disorder, moderate, dependence (HCC) 01/12/2016  . Self-inflicted laceration of wrist 01/11/2016  . Severe recurrent major depression without psychotic features (HCC) 01/11/2016    Past Surgical History:  Procedure Laterality Date  . CESAREAN SECTION  2000, 2002, 2013    Prior to Admission medications   Medication Sig Start Date End Date Taking? Authorizing Provider  cephALEXin (KEFLEX) 500 MG capsule Take 1 capsule (500 mg total) by mouth 2 (two) times daily. 09/09/16   Ignacia Bayley, PA-C  citalopram (CELEXA) 20 MG tablet Take 1 tablet (20 mg total) by mouth daily. 01/13/16   Jimmy Footman, MD  cloNIDine (CATAPRES) 0.1 MG tablet Take 1 tablet (0.1 mg total) by mouth 2 (two) times daily. 10/18/16   Jennye Moccasin, MD  etodolac (LODINE) 500 MG tablet Take 1 tablet (500 mg total) by mouth 2 (two) times daily. 07/27/16    Cuthriell, Delorise Royals, PA-C  fluticasone (FLONASE) 50 MCG/ACT nasal spray Place 1 spray into both nostrils daily.    [provider]  HYDROcodone-acetaminophen (NORCO) 5-325 MG tablet Take 1 tablet by mouth every 4 (four) hours as needed for moderate pain. 09/09/16   Ignacia Bayley, PA-C  ibuprofen (ADVIL,MOTRIN) 800 MG tablet Take 1 tablet (800 mg total) by mouth every 8 (eight) hours as needed. 07/05/16   Ignacia Bayley, PA-C  metroNIDAZOLE (METROGEL VAGINAL) 0.75 % vaginal gel Place 1 Applicatorful vaginally at bedtime. 09/22/17 09/27/17  Irean Hong, MD  ondansetron (ZOFRAN) 4 MG tablet Take 1 tablet (4 mg total) by mouth every 8 (eight) hours as needed for nausea or vomiting. 05/17/16   Jennye Moccasin, MD  pantoprazole (PROTONIX) 20 MG tablet Take 1 tablet (20 mg total) by mouth daily. 05/17/16 05/17/17  Jennye Moccasin, MD  traMADol (ULTRAM) 50 MG tablet Take 1 tablet (50 mg total) by mouth every 6 (six) hours as needed. 05/17/16   Jennye Moccasin, MD  traZODone (DESYREL) 100 MG tablet Take 1 tablet (100 mg total) by mouth at bedtime as needed for sleep. 01/13/16   Jimmy Footman, MD    Allergies Asa [aspirin]; Banana; Peanut-containing drug products; and Pecan nut (diagnostic)  Family History  Problem Relation Age of Onset  . Diabetes Mother     Social History Social History  Substance Use Topics  . Smoking status: Current Every Day Smoker    Packs/day: 0.35  .  Smokeless tobacco: Never Used  . Alcohol use Yes     Comment: social drinker    Review of Systems  Constitutional: No fever/chills. Eyes: No visual changes. ENT: No sore throat. Cardiovascular: Denies chest pain. Respiratory: Denies shortness of breath. Gastrointestinal: positive for pelvic pain. No abdominal pain.  No nausea, no vomiting.  No diarrhea.  No constipation. Genitourinary: Negative for dysuria. Musculoskeletal: Negative for back pain. Skin: Negative for rash. Neurological: Negative for  headaches, focal weakness or numbness.   ____________________________________________   PHYSICAL EXAM:  VITAL SIGNS: ED Triage Vitals  Enc Vitals Group     BP 09/21/17 2201 (!) 145/63     Pulse Rate 09/21/17 2201 69     Resp 09/21/17 2201 17     Temp 09/21/17 2201 97.6 F (36.4 C)     Temp Source 09/21/17 2201 Oral     SpO2 09/21/17 2201 99 %     Weight 09/21/17 2156 140 lb (63.5 kg)     Height --      Head Circumference --      Peak Flow --      Pain Score --      Pain Loc --      Pain Edu? --      Excl. in GC? --     Constitutional: Alert and oriented. Well appearing and in no acute distress. Eyes: Conjunctivae are normal. PERRL. EOMI. Head: Atraumatic. Nose: No congestion/rhinnorhea. Mouth/Throat: Mucous membranes are moist.  Oropharynx non-erythematous. Neck: No stridor.   Cardiovascular: Normal rate, regular rhythm. Grossly normal heart sounds.  Good peripheral circulation. Respiratory: Normal respiratory effort.  No retractions. Lungs CTAB. Gastrointestinal: Soft and mildly tender to palpation right pelvis without rebound or guarding. No tenderness at McBurney's point. No distention. No abdominal bruits. No CVA tenderness. Musculoskeletal: No lower extremity tenderness nor edema.  No joint effusions. Neurologic:  Normal speech and language. No gross focal neurologic deficits are appreciated. No gait instability. Skin:  Skin is warm, dry and intact. No rash noted. Psychiatric: Mood and affect are normal. Speech and behavior are normal.  ____________________________________________   LABS (all labs ordered are listed, but only abnormal results are displayed)  Labs Reviewed  WET PREP, GENITAL - Abnormal; Notable for the following:       Result Value   Clue Cells Wet Prep HPF POC PRESENT (*)    WBC, Wet Prep HPF POC MODERATE (*)    All other components within normal limits  COMPREHENSIVE METABOLIC PANEL - Abnormal; Notable for the following:    Glucose, Bld 141  (*)    ALT 9 (*)    Total Bilirubin 0.2 (*)    All other components within normal limits  URINALYSIS, COMPLETE (UACMP) WITH MICROSCOPIC - Abnormal; Notable for the following:    Color, Urine YELLOW (*)    APPearance HAZY (*)    Squamous Epithelial / LPF 0-5 (*)    All other components within normal limits  HCG, QUANTITATIVE, PREGNANCY - Abnormal; Notable for the following:    hCG, Beta Chain, Quant, S 65,941 (*)    All other components within normal limits  POCT PREGNANCY, URINE - Abnormal; Notable for the following:    Preg Test, Ur POSITIVE (*)    All other components within normal limits  CHLAMYDIA/NGC RT PCR ([facility] ONLY)  LIPASE, BLOOD  CBC  POC URINE PREG, ED   ____________________________________________  EKG  None ____________________________________________  RADIOLOGY  US Ob Comp Less 14 Wks  Result Date: 09/22/2017  CLINICAL DATA:  31 y/o F; pelvic pain, right greater than left, rule out ectopic. EXAM: OBSTETRIC <14 WK [place] AND TRANSVAGINAL OB US TECHNIQUE: Both transabdominal and transvaginal ultrasound examinations were performed for complete evaluation of the gestation as well as the maternal uterus, adnexal regions, and pelvic cul-de-sac. Transvaginal technique was performed to assess early pregnancy. COMPARISON:  None. FINDINGS: Intrauterine gestational sac: Single Yolk sac:  Visualized. Embryo:  Visualized. Cardiac Activity: Visualized. Heart Rate: 158  bpm CRL:  20  mm   8 w   4 d                  [place] EDC: 04/30/2018 Subchorionic hemorrhage:  None visualized. Maternal uterus/adnexae: Normal ovaries and uterus. IMPRESSION: Single live intrauterine pregnancy with estimated gestational age of [redacted] weeks and 4 days. No acute process identified. Electronically Signed   By: Mitzi Hansen M.D.   On: 09/22/2017 05:16   US Ob Transvaginal  Result Date: 09/22/2017 CLINICAL DATA:  31 y/o F; pelvic pain, right greater than left, rule out ectopic. EXAM: OBSTETRIC  <14 WK [place] AND TRANSVAGINAL OB US TECHNIQUE: Both transabdominal and transvaginal ultrasound examinations were performed for complete evaluation of the gestation as well as the maternal uterus, adnexal regions, and pelvic cul-de-sac. Transvaginal technique was performed to assess early pregnancy. COMPARISON:  None. FINDINGS: Intrauterine gestational sac: Single Yolk sac:  Visualized. Embryo:  Visualized. Cardiac Activity: Visualized. Heart Rate: 158  bpm CRL:  20  mm   8 w   4 d                  [place] EDC: 04/30/2018 Subchorionic hemorrhage:  None visualized. Maternal uterus/adnexae: Normal ovaries and uterus. IMPRESSION: Single live intrauterine pregnancy with estimated gestational age of [redacted] weeks and 4 days. No acute process identified. Electronically Signed   By: Mitzi Hansen M.D.   On: 09/22/2017 05:16    ____________________________________________   PROCEDURES  Procedure(s) performed:   Pelvic exam: External exam WNL without rashes, lesions or vesicles. Speculum exam reveals no vaginal bleeding. Mild odorous discharge. Bimanual exam tender to right adnexa.  Procedures  Critical Care performed: No  ____________________________________________   INITIAL IMPRESSION / ASSESSMENT AND PLAN / ED COURSE  Pertinent labs & imaging results that were available during my care of the patient were reviewed by me and considered in my medical decision making (see chart for details).  31 year old female approximately [redacted] weeks pregnant by dates who presents with pelvic pain, right greater than left. Differential diagnosis includes, but is not limited to, ovarian cyst, ovarian torsion, acute appendicitis, diverticulitis, urinary tract infection/pyelonephritis, endometriosis, bowel obstruction, colitis, renal colic, gastroenteritis, hernia, pregnancy related pain including ectopic pregnancy, etc. basic lab work and urinalysis unremarkable. Will check beta hCG and send for pelvic ultrasound to evaluate  ectopic pregnancy.   Clinical Course as of Sep 22 621  Sat Sep 22, 2017  0617 Updated patient and her mother of ultrasound results. Will discharge home on Metro[employer]el. Strict return precautions given. Both verbalize understanding and agree with plan of care.  [JS]    Clinical Course User Index [JS] Irean Hong, MD     ____________________________________________   FINAL CLINICAL IMPRESSION(S) / ED DIAGNOSES  Final diagnoses:  Pelvic pain in female  Bacterial vaginosis  First trimester pregnancy      NEW MEDICATIONS STARTED DURING THIS VISIT:  New Prescriptions   METRONIDAZOLE (METROGEL VAGINAL) 0.75 % VAGINAL GEL    Place 1 Applicatorful vaginally at bedtime.  Note:  This document was prepared using Dragon voice recognition software and may include unintentional dictation errors.    Irean Hong, MD 09/22/17 [phone]

## 2017-09-22 NOTE — ED Notes (Addendum)
Patient taken to US.

## 2017-09-25 ENCOUNTER — Telehealth: Payer: Self-pay | Admitting: Obstetrics & Gynecology

## 2017-09-25 NOTE — Telephone Encounter (Signed)
Patient called nurse line to find out if we are excepting new patients for pregnancy. Attempted to call patient unable to leave voicemail.

## 2017-12-29 ENCOUNTER — Encounter: Payer: Self-pay | Admitting: Emergency Medicine

## 2017-12-29 DIAGNOSIS — F419 Anxiety disorder, unspecified: Secondary | ICD-10-CM | POA: Diagnosis not present

## 2017-12-29 DIAGNOSIS — O99312 Alcohol use complicating pregnancy, second trimester: Secondary | ICD-10-CM | POA: Diagnosis not present

## 2017-12-29 DIAGNOSIS — F17200 Nicotine dependence, unspecified, uncomplicated: Secondary | ICD-10-CM | POA: Insufficient documentation

## 2017-12-29 DIAGNOSIS — F1499 Cocaine use, unspecified with unspecified cocaine-induced disorder: Secondary | ICD-10-CM | POA: Insufficient documentation

## 2017-12-29 DIAGNOSIS — Z79899 Other long term (current) drug therapy: Secondary | ICD-10-CM | POA: Insufficient documentation

## 2017-12-29 DIAGNOSIS — O99342 Other mental disorders complicating pregnancy, second trimester: Secondary | ICD-10-CM | POA: Insufficient documentation

## 2017-12-29 DIAGNOSIS — O99322 Drug use complicating pregnancy, second trimester: Secondary | ICD-10-CM | POA: Insufficient documentation

## 2017-12-29 DIAGNOSIS — F331 Major depressive disorder, recurrent, moderate: Secondary | ICD-10-CM | POA: Diagnosis not present

## 2017-12-29 DIAGNOSIS — F1020 Alcohol dependence, uncomplicated: Secondary | ICD-10-CM | POA: Insufficient documentation

## 2017-12-29 DIAGNOSIS — Z3A22 22 weeks gestation of pregnancy: Secondary | ICD-10-CM | POA: Diagnosis not present

## 2017-12-29 DIAGNOSIS — O26892 Other specified pregnancy related conditions, second trimester: Secondary | ICD-10-CM | POA: Insufficient documentation

## 2017-12-29 DIAGNOSIS — Z91010 Allergy to peanuts: Secondary | ICD-10-CM | POA: Insufficient documentation

## 2017-12-29 DIAGNOSIS — O99332 Smoking (tobacco) complicating pregnancy, second trimester: Secondary | ICD-10-CM | POA: Diagnosis not present

## 2017-12-29 NOTE — ED Triage Notes (Addendum)
Patient states that she wants to be admitted to the psych ward. Patient states that she was admitted to [facility] about a month ago and was started on new medications for anxiety and depression but they are not working. Patient denies SI at this time. Patient states that she is [redacted] weeks pregnant. Patient states that she has drank alcohol and used cocaine tonight.

## 2017-12-30 ENCOUNTER — Emergency Department
Admission: EM | Admit: 2017-12-30 | Discharge: 2018-01-01 | Disposition: A | Discharge status: Other Acute Inpatient Hospital | Payer: Medicaid Other | Attending: Emergency Medicine | Admitting: Emergency Medicine

## 2017-12-30 ENCOUNTER — Encounter: Payer: Self-pay | Admitting: Emergency Medicine

## 2017-12-30 DIAGNOSIS — F1010 Alcohol abuse, uncomplicated: Secondary | ICD-10-CM

## 2017-12-30 DIAGNOSIS — Z9119 Patient's noncompliance with other medical treatment and regimen: Secondary | ICD-10-CM

## 2017-12-30 DIAGNOSIS — F1020 Alcohol dependence, uncomplicated: Secondary | ICD-10-CM | POA: Diagnosis present

## 2017-12-30 DIAGNOSIS — Z3A22 22 weeks gestation of pregnancy: Secondary | ICD-10-CM

## 2017-12-30 DIAGNOSIS — Z91199 Patient's noncompliance with other medical treatment and regimen due to unspecified reason: Secondary | ICD-10-CM

## 2017-12-30 DIAGNOSIS — F329 Major depressive disorder, single episode, unspecified: Secondary | ICD-10-CM | POA: Diagnosis present

## 2017-12-30 DIAGNOSIS — F331 Major depressive disorder, recurrent, moderate: Secondary | ICD-10-CM

## 2017-12-30 DIAGNOSIS — F419 Anxiety disorder, unspecified: Secondary | ICD-10-CM

## 2017-12-30 DIAGNOSIS — F332 Major depressive disorder, recurrent severe without psychotic features: Secondary | ICD-10-CM | POA: Diagnosis present

## 2017-12-30 DIAGNOSIS — F1590 Other stimulant use, unspecified, uncomplicated: Secondary | ICD-10-CM | POA: Diagnosis present

## 2017-12-30 DIAGNOSIS — Z3490 Encounter for supervision of normal pregnancy, unspecified, unspecified trimester: Secondary | ICD-10-CM

## 2017-12-30 LAB — COMPREHENSIVE METABOLIC PANEL
ALBUMIN: 3.3 g/dL — AB (ref 3.5–5.0)
ALT: 9 U/L — ABNORMAL LOW (ref 14–54)
ANION GAP: 11 (ref 5–15)
AST: 14 U/L — ABNORMAL LOW (ref 15–41)
Alkaline Phosphatase: 69 U/L (ref 38–126)
BUN: 7 mg/dL (ref 6–20)
CO2: 18 mmol/L — AB (ref 22–32)
Calcium: 8.6 mg/dL — ABNORMAL LOW (ref 8.9–10.3)
Chloride: 106 mmol/L (ref 101–111)
Creatinine, Ser: 0.57 mg/dL (ref 0.44–1.00)
GFR calc non Af Amer: >60 mL/min (ref >60)
GLUCOSE: 91 mg/dL (ref 65–99)
POTASSIUM: 3.6 mmol/L (ref 3.5–5.1)
SODIUM: 135 mmol/L (ref 135–145)
Total Bilirubin: 0.5 mg/dL (ref 0.3–1.2)
Total Protein: 6.9 g/dL (ref 6.5–8.1)

## 2017-12-30 LAB — ETHANOL: Alcohol, Ethyl (B): 137 mg/dL — ABNORMAL HIGH (ref <10)

## 2017-12-30 LAB — CBC
HCT: 33.8 % — ABNORMAL LOW (ref 35.0–47.0)
HEMOGLOBIN: 11.5 g/dL — AB (ref 12.0–16.0)
MCH: 29.9 pg (ref 26.0–34.0)
MCHC: 34.1 g/dL (ref 32.0–36.0)
MCV: 87.7 fL (ref 80.0–100.0)
Platelets: 211 10*3/uL (ref 150–440)
RBC: 3.85 MIL/uL (ref 3.80–5.20)
RDW: 14.1 % (ref 11.5–14.5)
WBC: 17.2 10*3/uL — AB (ref 3.6–11.0)

## 2017-12-30 LAB — HCG, QUANTITATIVE, PREGNANCY: HCG, BETA CHAIN, QUANT, S: 8693 m[IU]/mL — AB (ref <5)

## 2017-12-30 MED ORDER — DIPHENHYDRAMINE HCL 25 MG PO CAPS
50.0000 mg | ORAL_CAPSULE | Freq: Once | ORAL | Status: AC
  Administered 2017-12-30: 50 mg via ORAL

## 2017-12-30 MED ORDER — ACETAMINOPHEN 325 MG PO TABS
650.0000 mg | ORAL_TABLET | Freq: Once | ORAL | Status: AC
  Administered 2017-12-30: 650 mg via ORAL

## 2017-12-30 MED ORDER — ACETAMINOPHEN 325 MG PO TABS
ORAL_TABLET | ORAL | Status: AC
  Filled 2017-12-30: qty 2

## 2017-12-30 MED ORDER — CALCIUM CARBONATE ANTACID 500 MG PO CHEW
1.0000 | CHEWABLE_TABLET | Freq: Once | ORAL | Status: AC
  Administered 2017-12-30: 200 mg via ORAL
  Filled 2017-12-30 (×2): qty 1

## 2017-12-30 MED ORDER — DIPHENHYDRAMINE HCL 25 MG PO CAPS
ORAL_CAPSULE | ORAL | Status: AC
  Filled 2017-12-30: qty 2

## 2017-12-30 MED ORDER — ACETAMINOPHEN 325 MG PO TABS
650.0000 mg | ORAL_TABLET | Freq: Once | ORAL | Status: AC
  Administered 2017-12-30: 650 mg via ORAL
  Filled 2017-12-30: qty 2

## 2017-12-30 NOTE — ED Provider Notes (Addendum)
[facility] Emergency Department Provider Note   ____________________________________________   First MD Initiated Contact with Patient 12/30/17 0017     (approximate)  I have reviewed the triage vital signs and the nursing notes.   HISTORY  Chief Complaint Anxiety    HPI [name] is a 32 y.o. female P5 G3 Ab1 at approximately 22 weeks who presents voluntarily to the emergency department with a chief complaint of "I want to be admitted to the psych ward".  Patient does have a history of depression, polysubstance abuse who was hospitalized at [facility] approximately 1 month ago and started on medications for anxiety and depression.  Patient reports she does not feel her medications are working.  Admits to alcohol and cocaine use tonight.  Denies active SI/HI/AH/VH.  Voices no medical complaints.  Specifically, denies recent fever, chills, chest pain, shortness of breath, abdominal pain, nausea, vomiting, dysuria.  States she is feeling the baby move.  Denies vaginal bleeding.  Denies recent travel or trauma.   Past Medical History:  Diagnosis Date  . Depression   . History of substance abuse   . History of suicide attempt   . History of thyroid disease   . Hypertension   . Thyroid disease     Patient Active Problem List   Diagnosis Date Noted  . Tobacco use disorder 01/12/2016  . Stimulant use disorder (HCC) (cocaine) 01/12/2016  . Alcohol use disorder, moderate, dependence (HCC) 01/12/2016  . Self-inflicted laceration of wrist 01/11/2016  . Severe recurrent major depression without psychotic features (HCC) 01/11/2016    Past Surgical History:  Procedure Laterality Date  . CESAREAN SECTION  2000, 2002, 2013    Prior to Admission medications   Medication Sig Start Date End Date Taking? Authorizing Provider  cephALEXin (KEFLEX) 500 MG capsule Take 1 capsule (500 mg total) by mouth 2 (two) times daily. 09/09/16   Ignacia Bayley, PA-C  citalopram  (CELEXA) 20 MG tablet Take 1 tablet (20 mg total) by mouth daily. 01/13/16   Jimmy Footman, MD  cloNIDine (CATAPRES) 0.1 MG tablet Take 1 tablet (0.1 mg total) by mouth 2 (two) times daily. 10/18/16   Jennye Moccasin, MD  etodolac (LODINE) 500 MG tablet Take 1 tablet (500 mg total) by mouth 2 (two) times daily. 07/27/16   Cuthriell, Delorise Royals, PA-C  fluticasone (FLONASE) 50 MCG/ACT nasal spray Place 1 spray into both nostrils daily.    [provider]  HYDROcodone-acetaminophen (NORCO) 5-325 MG tablet Take 1 tablet by mouth every 4 (four) hours as needed for moderate pain. 09/09/16   Ignacia Bayley, PA-C  ibuprofen (ADVIL,MOTRIN) 800 MG tablet Take 1 tablet (800 mg total) by mouth every 8 (eight) hours as needed. 07/05/16   Ignacia Bayley, PA-C  ondansetron (ZOFRAN) 4 MG tablet Take 1 tablet (4 mg total) by mouth every 8 (eight) hours as needed for nausea or vomiting. 05/17/16   Jennye Moccasin, MD  pantoprazole (PROTONIX) 20 MG tablet Take 1 tablet (20 mg total) by mouth daily. 05/17/16 05/17/17  Jennye Moccasin, MD  traMADol (ULTRAM) 50 MG tablet Take 1 tablet (50 mg total) by mouth every 6 (six) hours as needed. 05/17/16   Jennye Moccasin, MD  traZODone (DESYREL) 100 MG tablet Take 1 tablet (100 mg total) by mouth at bedtime as needed for sleep. 01/13/16   Jimmy Footman, MD    Allergies Asa [aspirin]; Banana; Peanut-containing drug products; and Pecan nut (diagnostic)  Family History  Problem Relation  Age of Onset  . Diabetes Mother     Social History Social History   Tobacco Use  . Smoking status: Current Every Day Smoker    Packs/day: 0.35  . Smokeless tobacco: Never Used  Substance Use Topics  . Alcohol use: Yes    Comment: social drinker  . Drug use: Yes    Types: Cocaine    Review of Systems  Constitutional: No fever/chills. Eyes: No visual changes. ENT: No sore throat. Cardiovascular: Denies chest pain. Respiratory: Denies shortness of  breath. Gastrointestinal: Positive for pregnancy.  No abdominal pain.  No nausea, no vomiting.  No diarrhea.  No constipation. Genitourinary: Negative for dysuria. Musculoskeletal: Negative for back pain. Skin: Negative for rash. Neurological: Negative for headaches, focal weakness or numbness. Psychiatric:Positive for anxiety and depression.  Negative for active SI/HI/AH/VH.  ____________________________________________   PHYSICAL EXAM:  VITAL SIGNS: ED Triage Vitals [12/29/17 2358]  Enc Vitals Group     BP (!) 125/50     Pulse Rate 82     Resp 18     Temp 98.1 F (36.7 C)     Temp Source Oral     SpO2 100 %     Weight 168 lb (76.2 kg)     Height 4\' 11"  (1.499 m)     Head Circumference      Peak Flow      Pain Score      Pain Loc      Pain Edu?      Excl. in GC?     Constitutional: Alert and oriented. Well appearing and in no acute distress.  Intoxicated and jovial. Eyes: Conjunctivae are normal. PERRL. EOMI. Head: Atraumatic. Nose: No congestion/rhinnorhea. Mouth/Throat: Mucous membranes are moist.  Oropharynx non-erythematous. Neck: No stridor.   Cardiovascular: Normal rate, regular rhythm. Grossly normal heart sounds.  Good peripheral circulation. Respiratory: Normal respiratory effort.  No retractions. Lungs CTAB. Gastrointestinal: Gravid.  Soft and nontender. No distention. No abdominal bruits. No CVA tenderness. Musculoskeletal: No lower extremity tenderness nor edema.  No joint effusions. Neurologic:  Normal speech and language. No gross focal neurologic deficits are appreciated. No gait instability. Skin:  Skin is warm, dry and intact. No rash noted. Psychiatric: Mood and affect are normal. Speech and behavior are normal.  ____________________________________________   LABS (all labs ordered are listed, but only abnormal results are displayed)  Labs Reviewed  COMPREHENSIVE METABOLIC PANEL - Abnormal; Notable for the following components:      Result  Value   CO2 18 (*)    Calcium 8.6 (*)    Albumin 3.3 (*)    AST 14 (*)    ALT 9 (*)    All other components within normal limits  ETHANOL - Abnormal; Notable for the following components:   Alcohol, Ethyl (B) 137 (*)    All other components within normal limits  CBC - Abnormal; Notable for the following components:   WBC 17.2 (*)    Hemoglobin 11.5 (*)    HCT 33.8 (*)    All other components within normal limits  HCG, QUANTITATIVE, PREGNANCY - Abnormal; Notable for the following components:   hCG, Beta Chain, Quant, S 8,693 (*)    All other components within normal limits  URINE DRUG SCREEN, QUALITATIVE ([facility] ONLY)  URINALYSIS, COMPLETE (UACMP) WITH MICROSCOPIC   ____________________________________________  EKG  None ____________________________________________  RADIOLOGY  No results found.  ____________________________________________   PROCEDURES  Procedure(s) performed: None  Procedures  Critical Care performed: No  ____________________________________________  INITIAL IMPRESSION / ASSESSMENT AND PLAN / ED COURSE  As part of my medical decision making, I reviewed the following data within the electronic MEDICAL RECORD NUMBER Nursing notes reviewed and incorporated, Labs reviewed, Old chart reviewed, A consult was requested and obtained from this/these consultant(s) Psychiatry and Notes from prior ED visits.   32 year old female approximately [redacted] weeks pregnant seeking behavioral medicine evaluation.  Voices no complaints of active SI/HI/AH/VH.  Laboratory results remarkable for leukocytosis which is most likely secondary to her active pregnancy.  Patient does not give any infectious history as far as cough; urinalysis is pending.  Will assess fetal heart tones.  Patient will remain voluntarily in the ED [facility].  Clinical Course as of Jan 04 2355  Wynelle LinkSun Dec 30, 2017  0706 [facility] Dr[facility] Alesia MorinMillan who  recommends IVC and admission to inpatient psychiatry service.  No medication recommendations.   [JS]  Tue Jan 01, 2018  1300 Patient has been accepted to the inpatient psychiatric service  [JW]    Clinical Course User Index [JS] Irean HongSung, Jade J, MD [JW] Emily FilbertWilliams, Jonathan E, MD     ____________________________________________   FINAL CLINICAL IMPRESSION(S) / ED DIAGNOSES  Final diagnoses:  [redacted] weeks gestation of pregnancy  Alcohol abuse  Moderate episode of recurrent major depressive disorder [facility](HCC)  Anxiety     ED Discharge Orders    None       Note:  This document was prepared using Dragon voice recognition software and may include unintentional dictation errors.    Irean HongSung, Jade J, MD 12/30/17 [id number]    Irean HongSung, Jade J, MD 01/04/18 2356

## 2017-12-30 NOTE — ED Notes (Signed)
Pt. [facility]  Pt. Advised that unit has cameras and that we would be performing 15 minute safety checks.  Pt. Oriented to unit.  Pt. Requested something to eat and drink and was given food tray and drink.

## 2017-12-30 NOTE — ED Notes (Signed)
Pt given sandwich tray 

## 2017-12-30 NOTE — ED Notes (Signed)
Patient asleep in room. No noted distress or abnormal behavior. Will continue 15 minute checks and observation by security cameras for safety. 

## 2017-12-30 NOTE — ED Notes (Signed)

## 2017-12-30 NOTE — ED Notes (Signed)
IVC 

## 2017-12-30 NOTE — ED Notes (Signed)
Pt given supplies to take shower. No behavioral issues. Maintained on 15 minute checks and observation by security camera for safety.

## 2017-12-30 NOTE — ED Notes (Signed)
Pt was unhappy with her breakfast tray. Pt stated she doesn't like eggs. RN called dietary services and ordered a new biscuit. Maintained on 15 minute checks and observation by security camera for safety.

## 2017-12-30 NOTE — BH Assessment (Signed)
Assessment Note  [name] is an 32 y.o. female who present to the ED for depression. Pt was sad and tearful stating that she feels alone in this world and doesn't feel like she has a support system. Pt is currently [redacted] weeks pregnant with her 4th child. Pt reports being intoxicated on half a pint of [employer] and cocaine. Pt reports that she has been sniffing cocaine "all day" but has come in with hopes of being placed in an inpatient treatment center. She states that she wants to be a good mother to her children but that her drug use gets in the way and she wants help to change.   Pt denies SI/HI and A/V H   Diagnosis: Depression  Past Medical History:  Past Medical History:  Diagnosis Date  . Depression   . History of substance abuse   . History of suicide attempt   . History of thyroid disease   . Hypertension   . Thyroid disease     Past Surgical History:  Procedure Laterality Date  . CESAREAN SECTION  2000, 2002, 2013    Family History:  Family History  Problem Relation Age of Onset  . Diabetes Mother     Social History:  reports that she has been smoking.  She has been smoking about 0.35 packs per day. she has never used smokeless tobacco. She reports that she drinks alcohol. She reports that she uses drugs. Drug: Cocaine.  Additional Social History:  Alcohol / Drug Use Pain Medications: See MAR Prescriptions: See MAR  Over the Counter: See MAR History of alcohol / drug use?: Yes Substance #1 Name of Substance 1: Cocaine  Substance #2 Name of Substance 2: Alcohol  CIWA: CIWA-Ar BP: (!) 125/50 Pulse Rate: 82 COWS:    Allergies:  Allergies  Allergen Reactions  . Asa [Aspirin] Anaphylaxis  . Banana Anaphylaxis  . Peanut-Containing Drug Products Shortness Of Breath, Itching and Swelling  . Pecan Nut (Diagnostic) Anaphylaxis    Home Medications:  (Not in a hospital admission)  OB/GYN Status:  Patient's last menstrual period was  03/09/2017 (exact date).  General Assessment Data [facility] T[facility] Assessment: In system Is this a Tele or Face-to-Face Assessment?: Face-to-Face Is this an Initial Assessment or a Re-assessment for this encounter?: Initial Assessment Marital status: Single Is patient pregnant?: Yes Pregnancy Status: Yes (Comment: include estimated delivery date) Living Arrangements: Parent Can pt return to current living arrangement?: Yes Admission Status: Voluntary Is patient capable of signing voluntary admission?: Yes Referral Source: Self/Family/Friend Insurance type: Medicaid  Medical Screening Exam [facility] Walk-in ONLY) Medical Exam completed: Yes  Crisis Care Plan Living Arrangements: Parent Legal Guardian: Other:  Education Status Highest grade of school patient has completed: 10th Name of school: [employer]  Risk to self with the past 6 months Suicidal Ideation: No Has patient been a risk to self within the past 6 months prior to admission? : No Suicidal Intent: No Has patient had any suicidal intent within the past 6 months prior to admission? : No Is patient at risk for suicide?: No Suicidal Plan?: No Has patient had any suicidal plan within the past 6 months prior to admission? : No Access to Means: No Previous Attempts/Gestures: No Triggers for Past Attempts: None known Intentional Self Injurious Behavior: Cutting Comment - Self Injurious Behavior: Pt cutts arms when feeling stressed Family Suicide History: Unknown Recent stressful life event(s): Loss (Comment)(Missing children) Persecutory voices/beliefs?: No Depression: Yes Depression  Symptoms: Tearfulness, Feeling worthless/self pity Substance abuse history and/or treatment for substance abuse?: Yes Suicide prevention information given to non-admitted patients: Not applicable  Risk to Others within the past 6 months Homicidal Ideation: No Does patient have any lifetime risk of violence  toward others beyond the six months prior to admission? : No Thoughts of Harm to Others: No Current Homicidal Intent: No Current Homicidal Plan: No Access to Homicidal Means: No History of harm to others?: No Assessment of Violence: None Noted Does patient have access to weapons?: No Criminal Charges Pending?: No Does patient have a court date: No Is patient on probation?: No  Psychosis Hallucinations: None noted Delusions: None noted  Mental Status Report Appearance/Hygiene: Disheveled, In scrubs Eye Contact: Fair Motor Activity: Freedom of movement, Restlessness Speech: Logical/coherent, Soft, Slurred Level of Consciousness: Alert, Crying Mood: Depressed, Helpless, Sad, Worthless, low self-esteem Affect: Depressed, Appropriate to circumstance, Sad Anxiety Level: Minimal Thought Processes: Relevant, Coherent Judgement: Impaired Orientation: Person, Place, Time, Situation, Appropriate for developmental age Obsessive Compulsive Thoughts/Behaviors: None  Cognitive Functioning Concentration: Decreased Memory: Recent Intact, Remote Intact IQ: Average Insight: Fair Impulse Control: Poor Appetite: Fair Weight Loss: 0 Weight Gain: 20 Sleep: Decreased Total Hours of Sleep: 3 Vegetative Symptoms: None     Prior Inpatient Therapy Prior Inpatient Therapy: Yes Prior Therapy Dates: 2016 Prior Therapy Facilty/Provider(s): [facility] Reason for Treatment: Depression  Prior Outpatient Therapy Does patient have an ACCT team?: No Does patient have Intensive In-House Services?  : No Does patient have [facility] services? : No Does patient have P4CC services?: No  ADL Screening (condition at time of admission) Is the patient deaf or have difficulty hearing?: No Does the patient have difficulty seeing, even when wearing glasses/contacts?: No Does the patient have difficulty concentrating, remembering, or making decisions?: No Does the patient have difficulty dressing or bathing?:  No Does the patient have difficulty walking or climbing stairs?: No Weakness of Legs: None Weakness of Arms/Hands: None  Home Assistive Devices/Equipment Home Assistive Devices/Equipment: None  Therapy Consults (therapy consults require a physician order) PT Evaluation Needed: No OT Evalulation Needed: No SLP Evaluation Needed: No Abuse/Neglect Assessment (Assessment to be complete while patient is alone) Abuse/Neglect Assessment Can Be Completed: Yes Physical Abuse: Denies Verbal Abuse: Denies Sexual Abuse: Denies Exploitation of patient/patient's resources: Denies Values / Beliefs Cultural Requests During Hospitalization: None Spiritual Requests During Hospitalization: None Consults Spiritual Care Consult Needed: No Social Work Consult Needed: No Merchant navy officer (For Healthcare) Does Patient Have a Medical Advance Directive?: No    Additional Information 1:1 In Past 12 Months?: No CIRT Risk: No Elopement Risk: No Does patient have medical clearance?: Yes  Child/Adolescent Assessment Running Away Risk: Denies Bed-Wetting: Denies Destruction of Property: Denies Cruelty to Animals: Denies Stealing: Denies Rebellious/Defies Authority: Denies Satanic Involvement: Denies Archivist: Denies Problems at [employer]: Denies Gang Involvement: Denies  Disposition:  Disposition Initial Assessment Completed for this Encounter: Yes Disposition of Patient: Pending Review with psychiatrist  On Site Evaluation by:   Reviewed with Physician:    Elandra Powell D Samit Sylve 12/30/2017 4:06 AM

## 2017-12-30 NOTE — ED Notes (Signed)
Pt. States having a HA.  DR. Dolores FrameSung notified.  Order given for medication.

## 2017-12-30 NOTE — ED Notes (Signed)
Pt. Talking to TTS at this time in room #5.

## 2017-12-30 NOTE — ED Notes (Signed)
IVC/SOC completed/Pending Disposition 

## 2017-12-30 NOTE — ED Notes (Signed)
Pt given crackers and cranberry juice. No further needs or concerns at this time. Maintained on 15 minute checks and observation by security camera for safety.

## 2017-12-30 NOTE — [facility] Counselor (Signed)
Staff left message [facility], for intake coordinator @ [phone] (hours of operation 8-4 for intake) per request of Dr. Maryruth BunKapur based on client's pregnancy complications.

## 2017-12-31 DIAGNOSIS — Z91199 Patient's noncompliance with other medical treatment and regimen due to unspecified reason: Secondary | ICD-10-CM

## 2017-12-31 DIAGNOSIS — Z3490 Encounter for supervision of normal pregnancy, unspecified, unspecified trimester: Secondary | ICD-10-CM

## 2017-12-31 DIAGNOSIS — Z9119 Patient's noncompliance with other medical treatment and regimen: Secondary | ICD-10-CM

## 2017-12-31 DIAGNOSIS — F332 Major depressive disorder, recurrent severe without psychotic features: Secondary | ICD-10-CM

## 2017-12-31 LAB — URINE DRUG SCREEN, QUALITATIVE (ARMC ONLY)
AMPHETAMINES, UR SCREEN: NOT DETECTED
Barbiturates, Ur Screen: NOT DETECTED
Benzodiazepine, Ur Scrn: NOT DETECTED
CANNABINOID 50 NG, UR ~~LOC~~: POSITIVE — AB
COCAINE METABOLITE, UR ~~LOC~~: POSITIVE — AB
MDMA (ECSTASY) UR SCREEN: NOT DETECTED
Methadone Scn, Ur: NOT DETECTED
OPIATE, UR SCREEN: NOT DETECTED
PHENCYCLIDINE (PCP) UR S: NOT DETECTED
Tricyclic, Ur Screen: NOT DETECTED

## 2017-12-31 LAB — URINALYSIS, COMPLETE (UACMP) WITH MICROSCOPIC
Bilirubin Urine: NEGATIVE
GLUCOSE, UA: NEGATIVE mg/dL
Hgb urine dipstick: NEGATIVE
KETONES UR: NEGATIVE mg/dL
Leukocytes, UA: NEGATIVE
Nitrite: NEGATIVE
PROTEIN: NEGATIVE mg/dL
Specific Gravity, Urine: 1.033 — ABNORMAL HIGH (ref 1.005–1.030)
pH: 6 (ref 5.0–8.0)

## 2017-12-31 MED ORDER — QUETIAPINE FUMARATE 25 MG PO TABS
50.0000 mg | ORAL_TABLET | Freq: Every day | ORAL | Status: DC
  Administered 2017-12-31: 50 mg via ORAL
  Filled 2017-12-31: qty 2

## 2017-12-31 MED ORDER — FOLIC ACID 1 MG PO TABS
1.0000 mg | ORAL_TABLET | Freq: Every day | ORAL | Status: DC
  Administered 2017-12-31 – 2018-01-01 (×2): 1 mg via ORAL
  Filled 2017-12-31 (×2): qty 1

## 2017-12-31 MED ORDER — VALACYCLOVIR HCL 500 MG PO TABS
500.0000 mg | ORAL_TABLET | Freq: Every day | ORAL | Status: DC
  Administered 2017-12-31 – 2018-01-01 (×2): 500 mg via ORAL
  Filled 2017-12-31 (×3): qty 1

## 2017-12-31 MED ORDER — ACETAMINOPHEN 325 MG PO TABS
650.0000 mg | ORAL_TABLET | Freq: Four times a day (QID) | ORAL | Status: DC | PRN
  Administered 2017-12-31 – 2018-01-01 (×2): 650 mg via ORAL
  Filled 2017-12-31 (×2): qty 2

## 2017-12-31 MED ORDER — DIPHENHYDRAMINE HCL 25 MG PO CAPS
50.0000 mg | ORAL_CAPSULE | Freq: Once | ORAL | Status: AC
  Administered 2017-12-31: 50 mg via ORAL
  Filled 2017-12-31: qty 2

## 2017-12-31 MED ORDER — DIPHENHYDRAMINE HCL 25 MG PO CAPS
50.0000 mg | ORAL_CAPSULE | Freq: Every evening | ORAL | Status: DC | PRN
  Administered 2017-12-31: 50 mg via ORAL
  Filled 2017-12-31: qty 2

## 2017-12-31 MED ORDER — SENNA 8.6 MG PO TABS
1.0000 | ORAL_TABLET | Freq: Two times a day (BID) | ORAL | Status: DC
  Administered 2017-12-31 – 2018-01-01 (×2): 8.6 mg via ORAL
  Filled 2017-12-31 (×5): qty 1

## 2017-12-31 MED ORDER — CALCIUM CARBONATE ANTACID 500 MG PO CHEW
1.0000 | CHEWABLE_TABLET | Freq: Three times a day (TID) | ORAL | Status: DC | PRN
  Administered 2017-12-31: 200 mg via ORAL
  Filled 2017-12-31: qty 1

## 2017-12-31 NOTE — ED Notes (Signed)
ENVIRONMENTAL ASSESSMENT  Potentially harmful objects out of patient reach: Yes.  Personal belongings secured: Yes.  Patient dressed in hospital provided attire only: Yes.  Plastic bags out of patient reach: Yes.  Patient care equipment (cords, cables, call bells, lines, and drains) shortened, removed, or accounted for: Yes.  Equipment and supplies removed from bottom of stretcher: Yes.  Potentially toxic materials out of patient reach: Yes.  Sharps container removed or out of patient reach: Yes.   BEHAVIORAL HEALTH ROUNDING  Patient sleeping: No.  Patient alert and oriented: yes  Behavior appropriate: Yes. ; If no, describe:  Nutrition and fluids offered: Yes  Toileting and hygiene offered: Yes  Sitter present: not applicable, Q 15 min safety rounds and observation via security camera. Law enforcement present: Yes ODS  ED BHU PLACEMENT JUSTIFICATION  Is the patient under IVC or is there intent for IVC: Yes.  Is the patient medically cleared: Yes.  Is there vacancy in the ED BHU: Yes.  Is the population mix appropriate for patient: Yes.  Is the patient awaiting placement in inpatient or outpatient setting: Yes.  Has the patient had a psychiatric consult: Yes.  Survey of unit performed for contraband, proper placement and condition of furniture, tampering with fixtures in bathroom, shower, and each patient room: Yes. ; Findings: All clear  APPEARANCE/BEHAVIOR  calm, cooperative and adequate rapport can be established  NEURO ASSESSMENT  Orientation: time, place and person  Hallucinations: No.None noted (Hallucinations)  Speech: Normal  Gait: normal  RESPIRATORY ASSESSMENT  WNL  CARDIOVASCULAR ASSESSMENT  WNL  GASTROINTESTINAL ASSESSMENT  WNL  EXTREMITIES  WNL  PLAN OF CARE  Provide calm/safe environment. Vital signs assessed TID. ED BHU Assessment once each 12-hour shift. Collaborate with TTS daily or as condition indicates. Assure the ED provider has rounded once each shift.  Provide and encourage hygiene. Provide redirection as needed. Assess for escalating behavior; address immediately and inform ED provider.  Assess family dynamic and appropriateness for visitation as needed: Yes. ; If necessary, describe findings:  Educate the patient/family about BHU procedures/visitation: Yes. ; If necessary, describe findings: Pt is calm and cooperative at this time. Pt understanding and accepting of unit procedures/rules. Will continue to monitor with Q 15 min safety rounds and observation via security camera.     

## 2017-12-31 NOTE — ED Notes (Signed)
ivc / per soc orders admit to inpatient psychiatry services

## 2017-12-31 NOTE — Consult Note (Signed)
[place] [facility] Consult   Reason for Consult: Consult for 32 year old woman with a history of mood disorder and substance abuse who came to the hospital requesting admission Referring Physician: [facility] Patient Identification: [name] MRN:  [id number] Principal Diagnosis: Severe recurrent major depression without psychotic features [facility]) Diagnosis:   Patient Active Problem List   Diagnosis Date Noted  . Pregnant [Z34.90] 12/31/2017  . Medically noncompliant [Z91.19] 12/31/2017  . Tobacco use disorder [F17.200] 01/12/2016  . Stimulant use disorder ([place]) (cocaine) [F15.90] 01/12/2016  . Alcohol use disorder, moderate, dependence ([place]) [F10.20] 01/12/2016  . Self-inflicted laceration of wrist [S61.519A] 01/11/2016  . Severe recurrent major depression without psychotic features ([place]) [F33.2] 01/11/2016    Total Time spent with patient: 1 hour  Subjective:   [name] is a 32 y.o. female patient admitted with "I feel terrible".  HPI: Patient interviewed chart reviewed.  Old notes including recent psychiatric history reviewed.  Patient was only partially cooperative with me today but it was enough to assist with decision making.  Patient came to the hospital over the weekend and at the time is documented to have been requesting admission to the psychiatric ward.  Evidently there was no space available and a recommendation was made that she be referred to [facility] to the perinatal psychiatric unit.  That referral process is still ongoing.  Patient is [redacted] weeks pregnant and admitted on coming in that she had been drinking and abusing cocaine.  She tells me today that her mood stays sad down and off all all the time.  Feels guilty and negative constantly.  Does not feel she has anything enjoyable in her life.  Has suicidal thoughts without immediate intent or plan and does denies psychosis.  She is angry right now about still being in the emergency room and closed in a small area  and has been agitated at times with staff.  Patient is not willing to share with me much detail about how much or how often she has been using substances but has stated that the medicine she was prescribed back in November at [facility] "do not work"  Medical history: Patient has a past history of self injury but there is no evidence that she has been doing that now.  She is [redacted] weeks pregnant.  Unclear if she has been following up with much or any prenatal care.  Social history: She will not share with me right now where she has been staying.  She does have family in the area and occasionally stays with them although not always.  Does not appear to be working.  Patient has, as far as I know, 3 prior children none of whom are in her custody  Substance abuse history: Long history of abuse of substances mostly alcohol and cocaine.  She has had 3 children previously all of whom reportedly she used cocaine during their gestation.  Long history of noncompliance because of rapid frustration and inability to follow through with outpatient treatment.  Past Psychiatric History: Patient has a diagnosis of depression versus bipolar disorder long history of depressed mood irritability and anger mood swings.  Has a history of self-mutilation.  Long history of substance abuse complicating the issue.  Possible improvement with some antidepressants but it is hard to tell given how much substance abuse has been involved.  As far as I can tell the only medicine she was prescribed when she was discharged from [facility] was a modest dose of Seroquel  Risk to Self: Suicidal Ideation: No Suicidal Intent: No Is patient at risk for suicide?: No Suicidal Plan?: No Access to Means: No Triggers for Past Attempts: None known Intentional Self Injurious Behavior: Cutting Comment - Self Injurious Behavior: Pt cutts arms when feeling stressed Risk to Others: Homicidal Ideation: No Thoughts of Harm to Others: No Current Homicidal Intent:  No Current Homicidal Plan: No Access to Homicidal Means: No History of harm to others?: No Assessment of Violence: None Noted Does patient have access to weapons?: No Criminal Charges Pending?: No Does patient have a court date: No Prior Inpatient Therapy: Prior Inpatient Therapy: Yes Prior Therapy Dates: 2016 Prior Therapy Facilty/Provider(s): [facility] Reason for Treatment: Depression Prior Outpatient Therapy: Does patient have an ACCT team?: No Does patient have Intensive In-House Services?  : No Does patient have [facility] services? : No Does patient have P4CC services?: No  Past Medical History:  Past Medical History:  Diagnosis Date  . Depression   . History of substance abuse   . History of suicide attempt   . History of thyroid disease   . Hypertension   . Thyroid disease     Past Surgical History:  Procedure Laterality Date  . CESAREAN SECTION  2000, 2002, 2013   Family History:  Family History  Problem Relation Age of Onset  . Diabetes Mother    Family Psychiatric  History: History of depression Social History:  Social History   Substance and Sexual Activity  Alcohol Use Yes   Comment: social drinker     Social History   Substance and Sexual Activity  Drug Use Yes  . Types: Cocaine    Social History   Socioeconomic History  . Marital status: Single    Spouse name: None  . Number of children: None  . Years of education: None  . Highest education level: None  Social Needs  . Financial resource strain: None  . Food insecurity - worry: None  . Food insecurity - inability: None  . Transportation needs - medical: None  . Transportation needs - non-medical: None  Occupational History  . None  Tobacco Use  . Smoking status: Current Every Day Smoker    Packs/day: 0.35  . Smokeless tobacco: Never Used  Substance and Sexual Activity  . Alcohol use: Yes    Comment: social drinker  . Drug use: Yes    Types: Cocaine  . Sexual activity: None  Other  Topics Concern  . None  Social History Narrative  . None   Additional Social History:    Allergies:   Allergies  Allergen Reactions  . Asa [Aspirin] Anaphylaxis  . Banana Anaphylaxis  . Peanut-Containing Drug Products Shortness Of Breath, Itching and Swelling  . Pecan Nut (Diagnostic) Anaphylaxis    Labs:  Results for orders placed or performed during the hospital encounter of 12/30/17 (from the past 48 hour(s))  Comprehensive metabolic panel     Status: Abnormal   Collection Time: 12/30/17 12:08 AM  Result Value Ref Range   Sodium 135 135 - 145 mmol/L   Potassium 3.6 3.5 - 5.1 mmol/L   Chloride 106 101 - 111 mmol/L   CO2 18 (L) 22 - 32 mmol/L   Glucose, Bld 91 65 - 99 mg/dL   BUN 7 6 - 20 mg/dL   Creatinine, Ser 0.57 0.44 - 1.00 mg/dL   Calcium 8.6 (L) 8.9 - 10.3 mg/dL   Total Protein 6.9 6.5 - 8.1 g/dL   Albumin 3.3 (L) 3.5 - 5.0  g/dL   AST 14 (L) 15 - 41 U/L   ALT 9 (L) 14 - 54 U/L   Alkaline Phosphatase 69 38 - 126 U/L   Total Bilirubin 0.5 0.3 - 1.2 mg/dL   GFR calc non Af Amer >60 >60 mL/min   GFR calc Af Amer >60 >60 mL/min    Comment: (NOTE) The eGFR has been calculated using the CKD EPI equation. This calculation has not been validated in all clinical situations. eGFR's persistently <60 mL/min signify possible Chronic Kidney Disease.    Anion gap 11 5 - 15    Comment: Performed at [facility], [address] [place], [place] [address]  Ethanol     Status: Abnormal   Collection Time: 12/30/17 12:08 AM  Result Value Ref Range   Alcohol, Ethyl (B) 137 (H) <10 mg/dL    Comment:        LOWEST DETECTABLE LIMIT FOR SERUM ALCOHOL IS 10 mg/dL FOR MEDICAL PURPOSES ONLY Performed at [facility], [address] [place], [place] [address]   cbc     Status: Abnormal   Collection Time: 12/30/17 12:08 AM  Result Value Ref Range   WBC 17.2 (H) 3.6 - 11.0 K/uL   RBC 3.85 3.80 - 5.20 MIL/uL   Hemoglobin 11.5 (L) 12.0 - 16.0 g/dL   HCT 33.8  (L) 35.0 - 47.0 %   MCV 87.7 80.0 - 100.0 fL   MCH 29.9 26.0 - 34.0 pg   MCHC 34.1 32.0 - 36.0 g/dL   RDW 14.1 11.5 - 14.5 %   Platelets 211 150 - 440 K/uL    Comment: Performed at [facility], [address] [place], [place] [address]  hCG, quantitative, pregnancy     Status: Abnormal   Collection Time: 12/30/17 12:08 AM  Result Value Ref Range   hCG, Beta Chain, Quant, S 8,693 (H) <5 mIU/mL    Comment:          GEST. AGE      CONC.  (mIU/mL)   <=1 WEEK        5 - 50     2 WEEKS       50 - 500     3 WEEKS       100 - 10,000     4 WEEKS     1,000 - 30,000     5 WEEKS     3,500 - 115,000   6-8 WEEKS     12,000 - 270,000    12 WEEKS     15,000 - 220,000        FEMALE AND NON-PREGNANT FEMALE:     LESS THAN 5 mIU/mL Performed at [facility], [address] [place], [place] [address]     No current facility-administered medications for this encounter.    Current Outpatient Medications  Medication Sig Dispense Refill  . cephALEXin (KEFLEX) 500 MG capsule Take 1 capsule (500 mg total) by mouth 2 (two) times daily. 10 capsule 0  . citalopram (CELEXA) 20 MG tablet Take 1 tablet (20 mg total) by mouth daily. 30 tablet 0  . cloNIDine (CATAPRES) 0.1 MG tablet Take 1 tablet (0.1 mg total) by mouth 2 (two) times daily. 60 tablet 1  . etodolac (LODINE) 500 MG tablet Take 1 tablet (500 mg total) by mouth 2 (two) times daily. 60 tablet 0  . fluticasone (FLONASE) 50 MCG/ACT nasal spray Place 1 spray into both nostrils daily.    Marland Kitchen HYDROcodone-acetaminophen (  NORCO) 5-325 MG tablet Take 1 tablet by mouth every 4 (four) hours as needed for moderate pain. 12 tablet 0  . ibuprofen (ADVIL,MOTRIN) 800 MG tablet Take 1 tablet (800 mg total) by mouth every 8 (eight) hours as needed. 15 tablet 0  . ondansetron (ZOFRAN) 4 MG tablet Take 1 tablet (4 mg total) by mouth every 8 (eight) hours as needed for nausea or vomiting. 21 tablet 0  . pantoprazole (PROTONIX) 20 MG tablet Take 1  tablet (20 mg total) by mouth daily. 30 tablet 1  . traMADol (ULTRAM) 50 MG tablet Take 1 tablet (50 mg total) by mouth every 6 (six) hours as needed. 20 tablet 0  . traZODone (DESYREL) 100 MG tablet Take 1 tablet (100 mg total) by mouth at bedtime as needed for sleep. 30 tablet 0    Musculoskeletal: Strength & Muscle Tone: within normal limits Gait & Station: normal Patient leans: N/A  Psychiatric Specialty Exam: Physical Exam  Nursing note and vitals reviewed. Constitutional: She appears well-developed and well-nourished.  HENT:  Head: Normocephalic and atraumatic.  Eyes: Conjunctivae are normal. Pupils are equal, round, and reactive to light.  Neck: Normal range of motion.  Cardiovascular: Regular rhythm and normal heart sounds.  Respiratory: Effort normal. No respiratory distress.  GI: Soft. She exhibits distension.  Musculoskeletal: Normal range of motion.  Neurological: She is alert.  Skin: Skin is warm and dry.  Psychiatric: Her affect is angry, labile and inappropriate. She is agitated. Thought content is not delusional. She expresses impulsivity and inappropriate judgment. She expresses suicidal ideation. She is noncommunicative. She exhibits abnormal recent memory.    Review of Systems  Constitutional: Negative.   HENT: Negative.   Eyes: Negative.   Respiratory: Negative.   Cardiovascular: Negative.   Gastrointestinal: Negative.   Musculoskeletal: Negative.   Skin: Negative.   Neurological: Negative.   Psychiatric/Behavioral: Positive for depression, memory loss, substance abuse and suicidal ideas. Negative for hallucinations. The patient is nervous/anxious and has insomnia.     Blood pressure 125/64, pulse 70, temperature 98 F (36.7 C), temperature source Oral, resp. rate 20, height 4' 11"  (1.499 m), weight 76.2 kg (168 lb), last menstrual period 03/09/2017, SpO2 100 %.Body mass index is 33.93 kg/m.  General Appearance: Disheveled  Eye Contact:  None  Speech:   Slow  Volume:  Decreased  Mood:  Angry, Anxious, Depressed and Irritable  Affect:  Congruent  Thought Process:  Coherent  Orientation:  Full (Time, Place, and Person)  Thought Content:  Rumination and Tangential  Suicidal Thoughts:  Yes.  with intent/plan  Homicidal Thoughts:  No  Memory:  Immediate;   Fair Recent;   Poor Remote;   Fair  Judgement:  Impaired  Insight:  Fair  Psychomotor Activity:  Decreased  Concentration:  Concentration: Fair  Recall:  [employer] of Knowledge:  Fair  Language:  Fair  Akathisia:  No  Handed:  Right  AIMS (if indicated):     Assets:  Physical Health Resilience  ADL's:  Intact  Cognition:  Impaired,  Mild  Sleep:        Treatment Plan Summary: Daily contact with patient to assess and evaluate symptoms and progress in treatment, Medication management and Plan 32 year old woman with mood disorder and substance abuse.  Although today she has intermittently been demanding to leave the hospital when I came to speak with her she emphasized how miserably sad she is how hopeless she is and how she really does not feel she has  any options.  She does not like being here in the emergency room but did not protest when I reminded her that the plan was to have her transferred back to [facility]  Case reviewed with TTS.  We will try referring her back to [facility] prenatal unit.  Meanwhile restart the Seroquel 50 mg at night.  Make sure she has folate.  PRN medications can be available if she is having nausea or other physical symptoms.  Case reviewed with TTS.  Disposition: Recommend psychiatric Inpatient admission when medically cleared. Supportive therapy provided about ongoing stressors.  Alethia Berthold, MD 12/31/2017 2:33 PM

## 2017-12-31 NOTE — ED Notes (Signed)
Pt up to use the bathroom and given urine cup. Obtained specimen and sent to the lab.

## 2017-12-31 NOTE — ED Provider Notes (Signed)
-----------------------------------------   1:45 PM on 12/31/2017 -----------------------------------------   Blood pressure 125/64, pulse 70, temperature 98 F (36.7 C), temperature source Oral, resp. rate 20, height 4\' 11"  (1.499 m), weight 76.2 kg (168 lb), last menstrual period 03/09/2017, SpO2 100 %.  The patient had no acute events since last update.  Patient has [facility], placement pending.    Minna AntisPaduchowski, Koree Schopf, MD 12/31/17 1346

## 2017-12-31 NOTE — ED Notes (Signed)
BEHAVIORAL HEALTH ROUNDING  Patient sleeping: No.  Patient alert and oriented: yes  Behavior appropriate: Yes. ; If no, describe:  Nutrition and fluids offered: Yes  Toileting and hygiene offered: Yes  Sitter present: not applicable, Q 15 min safety rounds and observation via security camera. Law enforcement present: Yes ODS  

## 2017-12-31 NOTE — BH Assessment (Signed)
Referral Information for Perinatal Psychiatric Hospitalization faxed to:  [facility] [phone])

## 2017-12-31 NOTE — ED Notes (Signed)
Patient calm, cooperative, and pleasant this shift.  Patient denies SI/HI/AVH.  No distress noted.

## 2017-12-31 NOTE — ED Notes (Addendum)
Pt up to the bathroom. Requested urine specimen from patient but she did not obtain one. Explained to patient that we needed the specimen to complete her care. Pt voices understanding. Pt asking for Tums for "heartburn" and also stating the medication she was given earlier has not helped her constipation. Pt offered saltines and ginger ale and same given. Will ask ED MD for Tums order.

## 2017-12-31 NOTE — ED Notes (Signed)
IVC/  PENDING  PLACEMENT 

## 2017-12-31 NOTE — Progress Notes (Signed)
Pt A & O X4. Denies SI, HI and AVH. Presents irritable with labile mood. Tearful throughout this shift. Demanding, verbally hostile on interactions. Disruptive in milieu, loud, yelling at staff on approach "get out of my room, I'm pregnant and yeah I should get what I want, I should be treated different". Demanding to go home, "this is fucking prison, I want to go home, the food is fucking cold, y'all not helping me shit just like [facility]". Verbal redirection ineffective at the time. ED physician notified, new order received for Benadryl 50 mg PO with no change in behavior when reassessed. Scheduled and PRN medications administered as ordered. Pt denies adverse drug reactions. Pt showered and changed scrubs this shift. Tolerated all PO intake well. Emotional support and availability provided to pt. Encouraged by Clinical research associatewriter to voice concerns. Safety checks maintained at Q 15 minutes intervals without self harm gestures or outburst.

## 2017-12-31 NOTE — BH Assessment (Addendum)
This Clinical research associatewriter spoke with [name] at [facility] and she provided number to the [facility] where all referrals are made: [phone]. TTS will send referral after Dr. Toni Amendlapacs sees patient this morning and recommends disposition.

## 2018-01-01 ENCOUNTER — Inpatient Hospital Stay
Admission: AD | Admit: 2018-01-01 | Discharge: 2018-01-04 | DRG: 832 | Disposition: A | Discharge status: Home / Self Care | Payer: Medicaid Other | Attending: Psychiatry | Admitting: Psychiatry

## 2018-01-01 ENCOUNTER — Other Ambulatory Visit: Payer: Self-pay

## 2018-01-01 DIAGNOSIS — F419 Anxiety disorder, unspecified: Secondary | ICD-10-CM | POA: Diagnosis present

## 2018-01-01 DIAGNOSIS — F1420 Cocaine dependence, uncomplicated: Secondary | ICD-10-CM | POA: Diagnosis not present

## 2018-01-01 DIAGNOSIS — Z3A22 22 weeks gestation of pregnancy: Secondary | ICD-10-CM | POA: Diagnosis not present

## 2018-01-01 DIAGNOSIS — O26892 Other specified pregnancy related conditions, second trimester: Secondary | ICD-10-CM | POA: Diagnosis present

## 2018-01-01 DIAGNOSIS — O99312 Alcohol use complicating pregnancy, second trimester: Secondary | ICD-10-CM | POA: Diagnosis present

## 2018-01-01 DIAGNOSIS — Z91018 Allergy to other foods: Secondary | ICD-10-CM | POA: Diagnosis not present

## 2018-01-01 DIAGNOSIS — Z9119 Patient's noncompliance with other medical treatment and regimen: Secondary | ICD-10-CM

## 2018-01-01 DIAGNOSIS — Z886 Allergy status to analgesic agent status: Secondary | ICD-10-CM | POA: Diagnosis not present

## 2018-01-01 DIAGNOSIS — F1220 Cannabis dependence, uncomplicated: Secondary | ICD-10-CM | POA: Diagnosis present

## 2018-01-01 DIAGNOSIS — O99342 Other mental disorders complicating pregnancy, second trimester: Principal | ICD-10-CM | POA: Diagnosis present

## 2018-01-01 DIAGNOSIS — E7800 Pure hypercholesterolemia, unspecified: Secondary | ICD-10-CM | POA: Diagnosis present

## 2018-01-01 DIAGNOSIS — F17200 Nicotine dependence, unspecified, uncomplicated: Secondary | ICD-10-CM | POA: Diagnosis present

## 2018-01-01 DIAGNOSIS — Z915 Personal history of self-harm: Secondary | ICD-10-CM | POA: Diagnosis not present

## 2018-01-01 DIAGNOSIS — O99332 Smoking (tobacco) complicating pregnancy, second trimester: Secondary | ICD-10-CM | POA: Diagnosis present

## 2018-01-01 DIAGNOSIS — R12 Heartburn: Secondary | ICD-10-CM | POA: Diagnosis present

## 2018-01-01 DIAGNOSIS — Z3490 Encounter for supervision of normal pregnancy, unspecified, unspecified trimester: Secondary | ICD-10-CM

## 2018-01-01 DIAGNOSIS — Z91010 Allergy to peanuts: Secondary | ICD-10-CM | POA: Diagnosis not present

## 2018-01-01 DIAGNOSIS — F17210 Nicotine dependence, cigarettes, uncomplicated: Secondary | ICD-10-CM | POA: Diagnosis present

## 2018-01-01 DIAGNOSIS — Z331 Pregnant state, incidental: Secondary | ICD-10-CM | POA: Diagnosis not present

## 2018-01-01 DIAGNOSIS — R45851 Suicidal ideations: Secondary | ICD-10-CM | POA: Diagnosis present

## 2018-01-01 DIAGNOSIS — G4700 Insomnia, unspecified: Secondary | ICD-10-CM | POA: Diagnosis present

## 2018-01-01 DIAGNOSIS — O99322 Drug use complicating pregnancy, second trimester: Secondary | ICD-10-CM | POA: Diagnosis present

## 2018-01-01 DIAGNOSIS — F1020 Alcohol dependence, uncomplicated: Secondary | ICD-10-CM | POA: Diagnosis present

## 2018-01-01 DIAGNOSIS — O43892 Other placental disorders, second trimester: Secondary | ICD-10-CM | POA: Diagnosis present

## 2018-01-01 DIAGNOSIS — F329 Major depressive disorder, single episode, unspecified: Secondary | ICD-10-CM | POA: Diagnosis present

## 2018-01-01 DIAGNOSIS — F332 Major depressive disorder, recurrent severe without psychotic features: Secondary | ICD-10-CM | POA: Diagnosis not present

## 2018-01-01 MED ORDER — SENNA 8.6 MG PO TABS
1.0000 | ORAL_TABLET | Freq: Two times a day (BID) | ORAL | Status: DC
  Administered 2018-01-02 – 2018-01-03 (×4): 8.6 mg via ORAL
  Filled 2018-01-01 (×5): qty 1

## 2018-01-01 MED ORDER — ACETAMINOPHEN 325 MG PO TABS
650.0000 mg | ORAL_TABLET | Freq: Four times a day (QID) | ORAL | Status: DC | PRN
  Administered 2018-01-02 – 2018-01-03 (×3): 650 mg via ORAL
  Filled 2018-01-01 (×3): qty 2

## 2018-01-01 MED ORDER — ALUM & MAG HYDROXIDE-SIMETH 200-200-20 MG/5ML PO SUSP
30.0000 mL | ORAL | Status: DC | PRN
  Administered 2018-01-03: 30 mL via ORAL
  Filled 2018-01-01: qty 30

## 2018-01-01 MED ORDER — ACETAMINOPHEN 325 MG PO TABS: 650.0000 mg | ORAL_TABLET | Freq: Four times a day (QID) | ORAL | Status: DC | PRN

## 2018-01-01 MED ORDER — CALCIUM CARBONATE ANTACID 500 MG PO CHEW
1.0000 | CHEWABLE_TABLET | Freq: Three times a day (TID) | ORAL | Status: DC | PRN
  Administered 2018-01-03: 200 mg via ORAL
  Filled 2018-01-01 (×2): qty 1

## 2018-01-01 MED ORDER — QUETIAPINE FUMARATE 25 MG PO TABS
50.0000 mg | ORAL_TABLET | Freq: Every day | ORAL | Status: DC
  Administered 2018-01-01: 50 mg via ORAL
  Filled 2018-01-01: qty 2

## 2018-01-01 MED ORDER — PRENATAL MULTIVITAMIN CH
1.0000 | ORAL_TABLET | Freq: Every day | ORAL | Status: DC
  Administered 2018-01-02 – 2018-01-04 (×3): 1 via ORAL
  Filled 2018-01-01 (×3): qty 1

## 2018-01-01 MED ORDER — HYDROXYZINE HCL 25 MG PO TABS
25.0000 mg | ORAL_TABLET | Freq: Three times a day (TID) | ORAL | Status: DC | PRN
  Administered 2018-01-03: 25 mg via ORAL
  Filled 2018-01-01: qty 1

## 2018-01-01 MED ORDER — FOLIC ACID 1 MG PO TABS
1.0000 mg | ORAL_TABLET | Freq: Every day | ORAL | Status: DC
  Administered 2018-01-02 – 2018-01-04 (×3): 1 mg via ORAL
  Filled 2018-01-01 (×3): qty 1

## 2018-01-01 MED ORDER — DIPHENHYDRAMINE HCL 25 MG PO CAPS
50.0000 mg | ORAL_CAPSULE | Freq: Every evening | ORAL | Status: DC | PRN
  Administered 2018-01-01: 50 mg via ORAL
  Filled 2018-01-01: qty 2

## 2018-01-01 MED ORDER — COMPLETENATE 29-1 MG PO CHEW: 1.0000 | CHEWABLE_TABLET | Freq: Every day | ORAL | Status: DC

## 2018-01-01 MED ORDER — MAGNESIUM HYDROXIDE 400 MG/5ML PO SUSP: 30.0000 mL | Freq: Every day | ORAL | Status: DC | PRN

## 2018-01-01 MED ORDER — VALACYCLOVIR HCL 500 MG PO TABS
500.0000 mg | ORAL_TABLET | Freq: Every day | ORAL | Status: DC
  Administered 2018-01-02: 500 mg via ORAL
  Filled 2018-01-01 (×2): qty 1

## 2018-01-01 NOTE — ED Notes (Signed)
BEHAVIORAL HEALTH ROUNDING Patient sleeping: Yes.   Patient alert and oriented: not applicable SLEEPING Behavior appropriate: Yes.  ; If no, describe: SLEEPING Nutrition and fluids offered: No SLEEPING Toileting and hygiene offered: NoSLEEPING Sitter present: not applicable, Q 15 min safety rounds and observation via security camera. Law enforcement present: Yes ODS 

## 2018-01-01 NOTE — ED Notes (Signed)
Patient up set when doing a round at 7:15 pm. Patient states, "Nurses before told her she was going to unit downstairs, and she is still here."  This Clinical research associatewriter advised patient that she would be moving but I was not sure of time and that as soon as I knew I would let her know. Patient let Matt RN obtain vitals.

## 2018-01-01 NOTE — ED Notes (Signed)
Patient exited tubroom unclothed.  Became loud and beligerent with staff and other patients.  Redirected by security to room.

## 2018-01-01 NOTE — Tx Team (Signed)
Initial Treatment Plan 01/01/2018 9:25 PM [name] [id number]    PATIENT STRESSORS: Financial difficulties Health problems Substance abuse Traumatic event   PATIENT STRENGTHS: Average or above average intelligence Capable of independent living Communication skills General fund of knowledge Motivation for treatment/growth Supportive family/friends   PATIENT IDENTIFIED PROBLEMS: Suicidal thoughts    Depression    Anxiety    Illicit drug use    Alcohol use     DISCHARGE CRITERIA:  Ability to meet basic life and health needs Adequate post-discharge living arrangements Improved stabilization in mood, thinking, and/or behavior Medical problems require only outpatient monitoring Motivation to continue treatment in a less acute level of care Reduction of life-threatening or endangering symptoms to within safe limits  PRELIMINARY DISCHARGE PLAN: Attend aftercare/continuing care group Attend 12-step recovery group Outpatient therapy Placement in alternative living arrangements Return to previous living arrangement  PATIENT/FAMILY INVOLVEMENT: This treatment plan has been presented to and reviewed with the patient, [name], .  The patient  have been given the opportunity to ask questions and make suggestions.  Lelan PonsAlexander  Jenine Krisher, RN 01/01/2018, 9:25 PM

## 2018-01-01 NOTE — ED Notes (Signed)
Pt IVC pending placement to [facility]. 

## 2018-01-01 NOTE — BH Assessment (Signed)
[facility] by Dr. Toni Amendlapacs.  Attending Physician will be Dr. Jennet MaduroPucilowska.   [facility]igi.   Intake Paper Work has been signed and placed on patient chart.  ER staff is aware of the admission ( Emily,ER Sect.; Dr. Darnelle CatalanMalinda, ER MD; Carlisle BeersLuann, Patient's Nurse & Mertie ClauseJeanelle, Patient Access).

## 2018-01-01 NOTE — Progress Notes (Signed)
Received patient from [facility]  Appeared depressed and calm but denies any suicidal thoughts and ideas, her affect is flat there are no signs of hallucination, delusion or bizarre behaviors, association are intact, thinking is logical and thought content is appropriate. Patient has distended stomach stating that she is [redacted] weeks pregnant, admit using illicit drugs and alcohol while pregnant, also report feeling helpless, depressed and out of control, assessment is complete, skin check done by a female staff, also check for any contraband and none was found, patient as introduced to room 303, and the unit, patient contract for safety  of self and others.

## 2018-01-01 NOTE — Consult Note (Signed)
[facility] Face-to[facility]Face [facility]iat[facility] Consult   Reason for Consult: Consult for this 32 year old woman with a history of depression mood instability and substance abuse Referring Physician: Mayford KnifeWilliams Patient Identification: [name] MRN:  [id number] Principal Diagnosis: Severe recurrent major depression without psychotic features [facility](HCC) Diagnosis:   Patient Active Problem List   Diagnosis Date Noted  . Pregnant [Z34.90] 12/31/2017    Priority: High  . Stimulant use disorder (HCC) (cocaine) [F15.90] 01/12/2016    Priority: High  . Alcohol use disorder, moderate, dependence (HCC) [F10.20] 01/12/2016    Priority: High  . Severe recurrent major depression without psychotic features (HCC) [F33.2] 01/11/2016    Priority: High  . Medically noncompliant [Z91.19] 12/31/2017    Priority: Medium  . Tobacco use disorder [F17.200] 01/12/2016  . Self-inflicted laceration of wrist [S61.519A] 01/11/2016    Total Time spent with patient: 45 minutes  Subjective:   [name] is a 32 y.o. female patient admitted with "I really need help".  HPI: See previous notes.  This is a follow-up for this 32 year old woman who has mood disorder and substance abuse.  Has been in the emergency room since this weekend where she presented with serious suicidal ideation.  Patient is [redacted] weeks pregnant and has been actively abusing alcohol and cocaine.  Continues to feel helpless and out of control.  Feels frightened of her behavior and the effect on herself and the unborn baby.  She has calm down a little from the last couple days and mostly has been cooperative.  [facility]  Consultation with full psychiatric staff today however suggest that a better plan for now would be to admit her to our hospital.  Social history: Has some family but stays estranged from them.  Not really clear what her best social support is.  Medical history: The she is pregnant.  Has  [facility] but currently without specific complaint.  Substance abuse history: Long history of cocaine and alcohol abuse no history of DTs or seizures.  Past Psychiatric History: Multiple presentations with mood instability anger rage depression.  History of self-mutilation and suicide attempts.  Poor response to medication but also poor compliance.  Long-standing substance abuse without good compliance  Risk to Self: Suicidal Ideation: No Suicidal Intent: No Is patient at risk for suicide?: No Suicidal Plan?: No Access to Means: No Triggers for Past Attempts: None known Intentional Self Injurious Behavior: Cutting Comment - Self Injurious Behavior: Pt cutts arms when feeling stressed Risk to Others: Homicidal Ideation: No Thoughts of Harm to Others: No Current Homicidal Intent: No Current Homicidal Plan: No Access to Homicidal Means: No History of harm to others?: No Assessment of Violence: None Noted Does patient have access to weapons?: No Criminal Charges Pending?: No Does patient have a court date: No Prior Inpatient Therapy: Prior Inpatient Therapy: Yes Prior Therapy Dates: 2016 Prior Therapy Facilty/Provider(s): [facility] Reason for Treatment: Depression Prior Outpatient Therapy: Does patient have an ACCT team?: No Does patient have Intensive In-House Services?  : No Does patient have [facility] services? : No Does patient have P4CC services?: No  Past Medical History:  Past Medical History:  Diagnosis Date  . Depression   . History of substance abuse   . History of suicide attempt   . History of thyroid disease   . Hypertension   . Thyroid disease     Past Surgical History:  Procedure Laterality Date  . CESAREAN  SECTION  2000, 2002, 2013   Family History:  Family History  Problem Relation Age of Onset  . Diabetes Mother    Family Psychiatric  History: Positive for depression and anxiety Social History:  Social History   Substance and  Sexual Activity  Alcohol Use Yes   Comment: social drinker     Social History   Substance and Sexual Activity  Drug Use Yes  . Types: Cocaine    Social History   Socioeconomic History  . Marital status: Single    Spouse name: None  . Number of children: None  . Years of education: None  . Highest education level: None  Social Needs  . Financial resource strain: None  . Food insecurity - worry: None  . Food insecurity - inability: None  . Transportation needs - medical: None  . Transportation needs - non-medical: None  Occupational History  . None  Tobacco Use  . Smoking status: Current Every Day Smoker    Packs/day: 0.35  . Smokeless tobacco: Never Used  Substance and Sexual Activity  . Alcohol use: Yes    Comment: social drinker  . Drug use: Yes    Types: Cocaine  . Sexual activity: None  Other Topics Concern  . None  Social History Narrative  . None   Additional Social History:    Allergies:   Allergies  Allergen Reactions  . Asa [Aspirin] Anaphylaxis  . Banana Anaphylaxis  . Peanut-Containing Drug Products Shortness Of Breath, Itching and Swelling  . Pecan Nut (Diagnostic) Anaphylaxis    Labs:  Results for orders placed or performed during the hospital encounter of 12/30/17 (from the past 48 hour(s))  Urine Drug Screen, Qualitative     Status: Abnormal   Collection Time: 12/30/17  9:22 PM  Result Value Ref Range   Tricyclic, Ur Screen NONE DETECTED NONE DETECTED   Amphetamines, Ur Screen NONE DETECTED NONE DETECTED   MDMA (Ecstasy)Ur Screen NONE DETECTED NONE DETECTED   Cocaine Metabolite,Ur House POSITIVE (A) NONE DETECTED   Opiate, Ur Screen NONE DETECTED NONE DETECTED   Phencyclidine (PCP) Ur S NONE DETECTED NONE DETECTED   Cannabinoid 50 Ng, Ur [place] POSITIVE (A) NONE DETECTED   Barbiturates, Ur Screen NONE DETECTED NONE DETECTED   Benzodiazepine, Ur Scrn NONE DETECTED NONE DETECTED   Methadone Scn, Ur NONE DETECTED NONE DETECTED    Comment:  (NOTE) Tricyclics + metabolites, urine    Cutoff 1000 ng/mL Amphetamines + metabolites, urine  Cutoff 1000 ng/mL MDMA (Ecstasy), urine              Cutoff 500 ng/mL Cocaine Metabolite, urine          Cutoff 300 ng/mL Opiate + metabolites, urine        Cutoff 300 ng/mL Phencyclidine (PCP), urine         Cutoff 25 ng/mL Cannabinoid, urine                 Cutoff 50 ng/mL Barbiturates + metabolites, urine  Cutoff 200 ng/mL Benzodiazepine, urine              Cutoff 200 ng/mL Methadone, urine                   Cutoff 300 ng/mL The urine drug screen provides only a preliminary, unconfirmed analytical test result and should not be used for non-medical purposes. Clinical consideration and professional judgment should be applied to any positive drug screen result due to possible interfering substances. A more  specific alternate chemical method must be used in order to obtain a confirmed analytical result. Gas chromatography / mass spectrometry (GC/MS) is the preferred confirmat ory method. Performed at [facility], [address] [place], [place] [address]   Urinalysis, Complete w Microscopic     Status: Abnormal   Collection Time: 12/30/17  9:22 PM  Result Value Ref Range   Color, Urine YELLOW (A) YELLOW   APPearance CLEAR (A) CLEAR   Specific Gravity, Urine 1.033 (H) 1.005 - 1.030   pH 6.0 5.0 - 8.0   Glucose, UA NEGATIVE NEGATIVE mg/dL   Hgb urine dipstick NEGATIVE NEGATIVE   Bilirubin Urine NEGATIVE NEGATIVE   Ketones, ur NEGATIVE NEGATIVE mg/dL   Protein, ur NEGATIVE NEGATIVE mg/dL   Nitrite NEGATIVE NEGATIVE   Leukocytes, UA NEGATIVE NEGATIVE   RBC / HPF 0-5 0 - 5 RBC/hpf   WBC, UA 0-5 0 - 5 WBC/hpf   Bacteria, UA RARE (A) NONE SEEN   Squamous Epithelial / LPF 0-5 (A) NONE SEEN   Mucus PRESENT     Comment: Performed at [facility], [address], [place], [place] [address]    Current Facility-Administered Medications  Medication Dose Route Frequency  Provider Last Rate Last Dose  . acetaminophen (TYLENOL) tablet 650 mg  650 mg Oral Q6H PRN Dayna Geurts, Jackquline Denmark, MD   650 mg at 12/31/17 1825  . calcium carbonate (TUMS - dosed in mg elemental calcium) chewable tablet 200 mg of elemental calcium  1 tablet Oral TID PRN Jeanmarie Plant, MD   200 mg of elemental calcium at 12/31/17 2230  . diphenhydrAMINE (BENADRYL) capsule 50 mg  50 mg Oral QHS PRN Jeanmarie Plant, MD   50 mg at 12/31/17 2231  . folic acid (FOLVITE) tablet 1 mg  1 mg Oral Daily Elaria Osias, Jackquline Denmark, MD   1 mg at 01/01/18 1206  . QUEtiapine (SEROQUEL) tablet 50 mg  50 mg Oral QHS Mylie Mccurley T, MD   50 mg at 12/31/17 2230  . senna (SENOKOT) tablet 8.6 mg  1 tablet Oral BID Neila Teem T, MD   8.6 mg at 01/01/18 1206  . valACYclovir (VALTREX) tablet 500 mg  500 mg Oral Daily Raylie Maddison, Jackquline Denmark, MD   500 mg at 01/01/18 1206   Current Outpatient Medications  Medication Sig Dispense Refill  . cephALEXin (KEFLEX) 500 MG capsule Take 1 capsule (500 mg total) by mouth 2 (two) times daily. 10 capsule 0  . citalopram (CELEXA) 20 MG tablet Take 1 tablet (20 mg total) by mouth daily. 30 tablet 0  . cloNIDine (CATAPRES) 0.1 MG tablet Take 1 tablet (0.1 mg total) by mouth 2 (two) times daily. 60 tablet 1  . etodolac (LODINE) 500 MG tablet Take 1 tablet (500 mg total) by mouth 2 (two) times daily. 60 tablet 0  . fluticasone (FLONASE) 50 MCG/ACT nasal spray Place 1 spray into both nostrils daily.    Marland Kitchen HYDROcodone-acetaminophen (NORCO) 5-325 MG tablet Take 1 tablet by mouth every 4 (four) hours as needed for moderate pain. 12 tablet 0  . ibuprofen (ADVIL,MOTRIN) 800 MG tablet Take 1 tablet (800 mg total) by mouth every 8 (eight) hours as needed. 15 tablet 0  . ondansetron (ZOFRAN) 4 MG tablet Take 1 tablet (4 mg total) by mouth every 8 (eight) hours as needed for nausea or vomiting. 21 tablet 0  . pantoprazole (PROTONIX) 20 MG tablet Take 1 tablet (20 mg total) by mouth daily. 30 tablet 1  .  traMADol  (ULTRAM) 50 MG tablet Take 1 tablet (50 mg total) by mouth every 6 (six) hours as needed. 20 tablet 0  . traZODone (DESYREL) 100 MG tablet Take 1 tablet (100 mg total) by mouth at bedtime as needed for sleep. 30 tablet 0    Musculoskeletal: Strength & Muscle Tone: within normal limits Gait & Station: normal Patient leans: N/A  Psychiatric Specialty Exam: Physical Exam  Nursing note and vitals reviewed. Constitutional: She appears well-developed and well-nourished.  HENT:  Head: Normocephalic and atraumatic.  Eyes: Conjunctivae are normal. Pupils are equal, round, and reactive to light.  Neck: Normal range of motion.  Cardiovascular: Regular rhythm and normal heart sounds.  Respiratory: Effort normal. No respiratory distress.  GI: Soft. She exhibits distension.  Musculoskeletal: Normal range of motion.  Neurological: She is alert.  Skin: Skin is warm and dry.  Psychiatric: Her mood appears anxious. Her affect is angry. Her speech is delayed. She is slowed. Thought content is paranoid. Cognition and memory are normal. She expresses impulsivity. She expresses suicidal ideation. She expresses no suicidal plans.    Review of Systems  Constitutional: Negative.   HENT: Negative.   Eyes: Negative.   Respiratory: Negative.   Cardiovascular: Negative.   Gastrointestinal: Negative.   Musculoskeletal: Negative.   Skin: Negative.   Neurological: Negative.   Psychiatric/Behavioral: Positive for depression, substance abuse and suicidal ideas. Negative for hallucinations and memory loss. The patient is nervous/anxious and has insomnia.     Blood pressure 114/61, pulse 66, temperature 98 F (36.7 C), temperature source Oral, resp. rate 18, height 4\' 11"  (1.499 m), weight 76.2 kg (168 lb), last menstrual period 03/09/2017, SpO2 100 %.Body mass index is 33.93 kg/m.  General Appearance: Disheveled  Eye Contact:  Good  Speech:  Slow  Volume:  Decreased  Mood:  Anxious and Depressed  Affect:   Depressed  Thought Process:  Goal Directed  Orientation:  Full (Time, Place, and Person)  Thought Content:  Logical  Suicidal Thoughts:  Yes.  with intent/plan  Homicidal Thoughts:  No  Memory:  Immediate;   Good Recent;   Fair Remote;   Fair  Judgement:  Fair  Insight:  Fair  Psychomotor Activity:  Restlessness  Concentration:  Concentration: Fair  Recall:  [employer] of Knowledge:  Fair  Language:  Fair  Akathisia:  Negative  Handed:  Right  AIMS (if indicated):     Assets:  Communication Skills Desire for Improvement Physical Health Resilience  ADL's:  Intact  Cognition:  WNL  Sleep:        Treatment Plan Summary: Daily contact with patient to assess and evaluate symptoms and progress in treatment, Medication management and Plan 32 year old woman with major depression suicidal ideation substance abuse severe social stresses and caring a pregnancy that she intends to carry to full term.  Patient will be admitted to the psychiatric ward because of dangerousness to self.  She is capable of engaging in appropriate individual and group therapy centered around both mood and substance abuse issues.  Patient was presented with the plan and agrees.  No acute change to medicine for today.  I did request at her request that she get double portions of food.  Disposition: Recommend psychiatric Inpatient admission when medically cleared. Supportive therapy provided about ongoing stressors.  Mordecai Rasmussen, MD 01/01/2018 3:09 PM

## 2018-01-01 NOTE — ED Notes (Signed)
Threw meal tray and milk against the wall in dayroom. Angry that food tray was not what she wanted. Redirected back to room.

## 2018-01-01 NOTE — ED Notes (Signed)
Patient refused FHT and VS.

## 2018-01-01 NOTE — ED Provider Notes (Signed)
-----------------------------------------   7:03 AM on 01/01/2018 -----------------------------------------   Blood pressure 114/61, pulse 66, temperature 98 F (36.7 C), temperature source Oral, resp. rate 18, height 1.499 m (4\' 11" ), weight 76.2 kg (168 lb), last menstrual period 03/09/2017, SpO2 100 %.  The patient had no acute events since last update.  Calm and cooperative at this time.  The patient is being referred out for placement   Loleta RoseForbach, Lucus Lambertson, MD 01/01/18 [phone]

## 2018-01-01 NOTE — BH Assessment (Signed)
Referral Information for Perinatal Psychiatric Hospitalization faxed to:  [facility] [place] [phone])- Per Drinda ButtsAnnette patient denied due to no available beds.

## 2018-01-01 NOTE — ED Notes (Signed)
Pt up to bathroom and returned to bed.  

## 2018-01-02 DIAGNOSIS — F1220 Cannabis dependence, uncomplicated: Secondary | ICD-10-CM

## 2018-01-02 DIAGNOSIS — F332 Major depressive disorder, recurrent severe without psychotic features: Secondary | ICD-10-CM

## 2018-01-02 DIAGNOSIS — F1420 Cocaine dependence, uncomplicated: Secondary | ICD-10-CM

## 2018-01-02 DIAGNOSIS — Z331 Pregnant state, incidental: Secondary | ICD-10-CM

## 2018-01-02 LAB — CBC
HEMATOCRIT: 34.5 % — AB (ref 35.0–47.0)
HEMOGLOBIN: 11.7 g/dL — AB (ref 12.0–16.0)
MCH: 30.3 pg (ref 26.0–34.0)
MCHC: 33.9 g/dL (ref 32.0–36.0)
MCV: 89.3 fL (ref 80.0–100.0)
Platelets: 170 10*3/uL (ref 150–440)
RBC: 3.86 MIL/uL (ref 3.80–5.20)
RDW: 14 % (ref 11.5–14.5)
WBC: 6.8 10*3/uL (ref 3.6–11.0)

## 2018-01-02 LAB — TSH: TSH: 1.65 u[IU]/mL (ref 0.350–4.500)

## 2018-01-02 LAB — LIPID PANEL
CHOLESTEROL: 218 mg/dL — AB (ref 0–200)
HDL: 81 mg/dL (ref >40)
LDL Cholesterol: 100 mg/dL — ABNORMAL HIGH (ref 0–99)
Total CHOL/HDL Ratio: 2.7 RATIO
Triglycerides: 187 mg/dL — ABNORMAL HIGH (ref <150)
VLDL: 37 mg/dL (ref 0–40)

## 2018-01-02 LAB — HEMOGLOBIN A1C
Hgb A1c MFr Bld: 5.1 % (ref 4.8–5.6)
Mean Plasma Glucose: 99.67 mg/dL

## 2018-01-02 MED ORDER — SERTRALINE HCL 25 MG PO TABS
50.0000 mg | ORAL_TABLET | Freq: Every day | ORAL | Status: DC
  Administered 2018-01-02 – 2018-01-03 (×2): 50 mg via ORAL
  Filled 2018-01-02 (×2): qty 2

## 2018-01-02 MED ORDER — HYDROXYPROGESTERONE CAPROATE 250 MG/ML IM OIL
250.0000 mg | TOPICAL_OIL | Freq: Once | INTRAMUSCULAR | Status: DC
  Filled 2018-01-02: qty 1

## 2018-01-02 MED ORDER — ZOLPIDEM TARTRATE 5 MG PO TABS
5.0000 mg | ORAL_TABLET | Freq: Every evening | ORAL | Status: DC | PRN
  Administered 2018-01-02: 5 mg via ORAL
  Filled 2018-01-02: qty 1

## 2018-01-02 MED ORDER — HYDROXYPROGESTERONE CAPROATE 250 MG/ML IM OIL
250.0000 mg | TOPICAL_OIL | INTRAMUSCULAR | Status: DC
  Administered 2018-01-02: 250 mg via INTRAMUSCULAR

## 2018-01-02 MED ORDER — PANTOPRAZOLE SODIUM 40 MG PO TBEC
40.0000 mg | DELAYED_RELEASE_TABLET | Freq: Every day | ORAL | Status: DC
  Administered 2018-01-02 – 2018-01-04 (×3): 40 mg via ORAL
  Filled 2018-01-02 (×3): qty 1

## 2018-01-02 MED ORDER — FAMOTIDINE IN NACL 20-0.9 MG/50ML-% IV SOLN
20.0000 mg | Freq: Two times a day (BID) | INTRAVENOUS | Status: DC
  Filled 2018-01-02 (×2): qty 50

## 2018-01-02 NOTE — Tx Team (Signed)
Interdisciplinary Treatment and Diagnostic Plan Update  01/02/2018 Time of Session: 10:50 AM [name] MRN: [id number]  Principal Diagnosis: Major depressive disorder, recurrent severe without psychotic features ([place])  Secondary Diagnoses: Principal Problem:   Major depressive disorder, recurrent severe without psychotic features ([place]) Active Problems:   Tobacco use disorder   Alcohol use disorder, moderate, dependence ([place])   Pregnant   Cocaine use disorder, moderate, dependence ([facility])   Cannabis use disorder, moderate, dependence ([place])   Current Medications:  Current Facility-Administered Medications  Medication Dose Route Frequency Provider Last Rate Last Dose  . acetaminophen (TYLENOL) tablet 650 mg  650 mg Oral Q6H PRN Clapacs, John T, MD      . alum & mag hydroxide-simeth (MAALOX/MYLANTA) 200-200-20 MG/5ML suspension 30 mL  30 mL Oral Q4H PRN Clapacs, John T, MD      . calcium carbonate (TUMS - dosed in mg elemental calcium) chewable tablet 200 mg of elemental calcium  1 tablet Oral TID PRN Clapacs, John T, MD      . famotidine (PEPCID) IVPB 20 mg premix  20 mg Intravenous Q12H Pucilowska, Jolanta B, MD      . folic acid (FOLVITE) tablet 1 mg  1 mg Oral Daily Clapacs, John T, MD   1 mg at 01/02/18 0865  . hydrOXYzine (ATARAX/VISTARIL) tablet 25 mg  25 mg Oral TID PRN Clapacs, John T, MD      . magnesium hydroxide (MILK OF MAGNESIA) suspension 30 mL  30 mL Oral Daily PRN Clapacs, John T, MD      . prenatal multivitamin tablet 1 tablet  1 tablet Oral Q1200 Pucilowska, Jolanta B, MD   1 tablet at 01/02/18 1203  . senna (SENOKOT) tablet 8.6 mg  1 tablet Oral BID Clapacs, Madie [place], MD   8.6 mg at 01/02/18 1202  . sertraline (ZOLOFT) tablet 50 mg  50 mg Oral QHS Pucilowska, Jolanta B, MD      . valACYclovir (VALTREX) tablet 500 mg  500 mg Oral Daily Clapacs, John T, MD   500 mg at 01/02/18 0834  . zolpidem (AMBIEN) tablet 5 mg  5 mg Oral QHS PRN Pucilowska, Jolanta B, MD       PTA  Medications: Medications Prior to Admission  Medication Sig Dispense Refill Last Dose  . cephALEXin (KEFLEX) 500 MG capsule Take 1 capsule (500 mg total) by mouth 2 (two) times daily. 10 capsule 0   . citalopram (CELEXA) 20 MG tablet Take 1 tablet (20 mg total) by mouth daily. 30 tablet 0   . cloNIDine (CATAPRES) 0.1 MG tablet Take 1 tablet (0.1 mg total) by mouth 2 (two) times daily. 60 tablet 1   . etodolac (LODINE) 500 MG tablet Take 1 tablet (500 mg total) by mouth 2 (two) times daily. 60 tablet 0   . fluticasone (FLONASE) 50 MCG/ACT nasal spray Place 1 spray into both nostrils daily.   11/16/2015 at am  . HYDROcodone-acetaminophen (NORCO) 5-325 MG tablet Take 1 tablet by mouth every 4 (four) hours as needed for moderate pain. 12 tablet 0   . ibuprofen (ADVIL,MOTRIN) 800 MG tablet Take 1 tablet (800 mg total) by mouth every 8 (eight) hours as needed. 15 tablet 0   . ondansetron (ZOFRAN) 4 MG tablet Take 1 tablet (4 mg total) by mouth every 8 (eight) hours as needed for nausea or vomiting. 21 tablet 0   . pantoprazole (PROTONIX) 20 MG tablet Take 1 tablet (20 mg total) by mouth daily. 30 tablet 1   .  traMADol (ULTRAM) 50 MG tablet Take 1 tablet (50 mg total) by mouth every 6 (six) hours as needed. 20 tablet 0   . traZODone (DESYREL) 100 MG tablet Take 1 tablet (100 mg total) by mouth at bedtime as needed for sleep. 30 tablet 0     Patient Stressors: Financial difficulties Health problems Substance abuse Traumatic event  Patient Strengths: Average or above average intelligence Capable of independent living Communication skills General fund of knowledge Motivation for treatment/growth Supportive family/friends  Treatment Modalities: Medication Management, Group therapy, Case management,  1 to 1 session with clinician, Psychoeducation, Recreational therapy.   Physician Treatment Plan for Primary Diagnosis: Major depressive disorder, recurrent severe without psychotic features  ([place]) Long Term Goal(s): Improvement in symptoms so as ready for discharge Improvement in symptoms so as ready for discharge   Short Term Goals: Ability to identify changes in lifestyle to reduce recurrence of condition will improve Ability to verbalize feelings will improve Ability to disclose and discuss suicidal ideas Ability to demonstrate self-control will improve Ability to identify and develop effective coping behaviors will improve Ability to maintain clinical measurements within normal limits will improve Compliance with prescribed medications will improve Ability to identify triggers associated with substance abuse/mental health issues will improve Ability to identify changes in lifestyle to reduce recurrence of condition will improve Ability to demonstrate self-control will improve Ability to identify triggers associated with substance abuse/mental health issues will improve  Medication Management: Evaluate patient's response, side effects, and tolerance of medication regimen.  Therapeutic Interventions: 1 to 1 sessions, Unit Group sessions and Medication administration.  Evaluation of Outcomes: Not Met  Physician Treatment Plan for Secondary Diagnosis: Principal Problem:   Major depressive disorder, recurrent severe without psychotic features ([place]) Active Problems:   Tobacco use disorder   Alcohol use disorder, moderate, dependence ([facility])   Pregnant   Cocaine use disorder, moderate, dependence ([facility])   Cannabis use disorder, moderate, dependence ([place])  Long Term Goal(s): Improvement in symptoms so as ready for discharge Improvement in symptoms so as ready for discharge   Short Term Goals: Ability to identify changes in lifestyle to reduce recurrence of condition will improve Ability to verbalize feelings will improve Ability to disclose and discuss suicidal ideas Ability to demonstrate self-control will improve Ability to identify and develop effective coping behaviors  will improve Ability to maintain clinical measurements within normal limits will improve Compliance with prescribed medications will improve Ability to identify triggers associated with substance abuse/mental health issues will improve Ability to identify changes in lifestyle to reduce recurrence of condition will improve Ability to demonstrate self-control will improve Ability to identify triggers associated with substance abuse/mental health issues will improve     Medication Management: Evaluate patient's response, side effects, and tolerance of medication regimen.  Therapeutic Interventions: 1 to 1 sessions, Unit Group sessions and Medication administration.  Evaluation of Outcomes: Not Met   RN Treatment Plan for Primary Diagnosis: Major depressive disorder, recurrent severe without psychotic features ([place]) Long Term Goal(s): Knowledge of disease and therapeutic regimen to maintain health will improve  Short Term Goals: Ability to participate in decision making will improve, Ability to disclose and discuss suicidal ideas, Ability to identify and develop effective coping behaviors will improve and Compliance with prescribed medications will improve  Medication Management: RN will administer medications as ordered by provider, will assess and evaluate patient's response and provide education to patient for prescribed medication. RN will report any adverse and/or side effects to prescribing provider.  Therapeutic Interventions:  1 on 1 counseling sessions, Psychoeducation, Medication administration, Evaluate responses to treatment, Monitor vital signs and CBGs as ordered, Perform/monitor CIWA, COWS, AIMS and Fall Risk screenings as ordered, Perform wound care treatments as ordered.  Evaluation of Outcomes: Not Met   LCSW Treatment Plan for Primary Diagnosis: Major depressive disorder, recurrent severe without psychotic features ([place]) Long Term Goal(s): Safe transition to appropriate next  level of care at discharge, Engage patient in therapeutic group addressing interpersonal concerns.  Short Term Goals: Engage patient in aftercare planning with referrals and resources, Increase social support and Increase skills for wellness and recovery  Therapeutic Interventions: Assess for all discharge needs, 1 to 1 time with Social worker, Explore available resources and support systems, Assess for adequacy in community support network, Educate family and significant other(s) on suicide prevention, Complete Psychosocial Assessment, Interpersonal group therapy.  Evaluation of Outcomes: Not Met   Progress in Treatment: Attending groups: No. Participating in groups: No. Taking medication as prescribed: Yes. Toleration medication: Yes. Family/Significant other contact made: No, will contact:  CSW will contact identifed support when identified and consent if given Patient understands diagnosis: Yes. Discussing patient identified problems/goals with staff: Yes. Medical problems stabilized or resolved: Yes. Denies suicidal/homicidal ideation: Yes. Issues/concerns per patient self-inventory: No. Other: n/a  New problem(s) identified: No, Describe:  No new problems identified  New Short Term/Long Term Goal(s): Pt refused to attend tx team meeting today so no goal has been established from pt.  Discharge Plan or Barriers: Pt has expressed interest in discharged to a substance abuse residential facility.    Reason for Continuation of Hospitalization: Depression Medication stabilization Suicidal ideation  Estimated Length of Stay: 3-5 days   Recreational Therapy: Patient Stressors: Not having my own space and being pregnant Patient Goal: Patient will identify 3 triggers to anxiety x5 days.   Attendees: Patient: 01/02/2018 1:44 PM  Physician: Orson Slick, MD 01/02/2018 1:44 PM  Nursing: Polly Cobia, RN 01/02/2018 1:44 PM  RN Care Manager: 01/02/2018 1:44 PM  Social Worker: Derrek Gu, LCSW 01/02/2018 1:44 PM  Recreational Therapist: Roanna Epley, LRT 01/02/2018 1:44 PM  Other:  01/02/2018 1:44 PM  Other:  01/02/2018 1:44 PM  Other: 01/02/2018 1:44 PM    Scribe for Treatment Team: Devona Konig, LCSW 01/02/2018 1:44 PM

## 2018-01-02 NOTE — Progress Notes (Signed)
Patient ID: [name], female   DOB: 04/30/1986, 32 y.o.   MRN: [id number]  Spoke with Dr. Feliberto GottronSchermerhorn. Patient needs to continue weekly 17-P injections. There is no need for Valtrex until 36 weeks unless there is an outbreak.

## 2018-01-02 NOTE — [facility] Group Notes (Signed)
[facility] Group Notes:  (Nursing/MHT/Case Management/Adjunct)  Date:  01/02/2018  Time:  11:32 PM  Type of Therapy:  Group Therapy  Participation Level:  Active  Participation Quality:  Appropriate  Affect:  Appropriate  Cognitive:  Alert  Insight:  Appropriate  Engagement in Group:  Engaged  Modes of Intervention:  Activity  Summary of Progress/Problems:  Mayra NeerJackie L Ninamarie Keel 01/02/2018, 11:32 PM

## 2018-01-02 NOTE — H&P (Addendum)
Psychiatric Admission Assessment Adult  Patient Identification: [name] MRN:  [id number] Date of Evaluation:  01/02/2018 Chief Complaint:  Depression Principal Diagnosis: Major depressive disorder, recurrent severe without psychotic features (HCC) Diagnosis:   Patient Active Problem List   Diagnosis Date Noted  . Major depressive disorder, recurrent severe without psychotic features (HCC) [F33.2] 01/11/2016    Priority: High  . Cocaine use disorder, moderate, dependence (HCC) [F14.20] 01/01/2018  . Cannabis use disorder, moderate, dependence (HCC) [F12.20] 01/01/2018  . Pregnant [Z34.90] 12/31/2017  . Medically noncompliant [Z91.19] 12/31/2017  . Tobacco use disorder [F17.200] 01/12/2016  . Stimulant use disorder (HCC) (cocaine) [F15.90] 01/12/2016  . Alcohol use disorder, moderate, dependence (HCC) [F10.20] 01/12/2016  . Self-inflicted laceration of wrist [S61.519A] 01/11/2016   History of Present Illness:   Identifying data. [name] is a 32 year old pregnant female with a history of depression and substance abuse.  Chief complaint. "I am depressed and hopeless."  History of present illness. Information was obtained from the patient and the chart. The patient came to the ER complaining of worsening of depression, severe anxiety and suicidal ideation. She was [facility] at [facility] in November in a similar scenario but did not follow up with metal health providers or took prescribed medications. Moreover, she relapsen on cocaine, cannabis and alcohol. She reports many symptoms of depression with poor sleep, decreased appetite, anhedonia, feeling of guilt hopelessness, helplessness, poor energy and concentration, social isolation, crying spells, heighten anxiety that culminated in suicidal thinking. She denies psychotic symptoms or symptoms suggestive of bipolar mania. She started worrying about the safety of the baby and came to the hospital. [name] is high risk pregnancy due to  substance abuse and shortened placenta.  Past Psychiatric history. Long history of depression, anxiety, mood instability and substance abuse. She has been hospitalized in the past and tried on numerous medications which she can't name. She was given Sero[facility] but did not feel it was helpful, even for sleep. She reports two suicide attempts by overdose and hanging when placed in foster care years ago. She remembers being treated for depression during one of her pregnancies.   Family psychiatric history. None reported.  Social history. She now lives with her biological mother. They have decent relationship. There is no involvement from the father of the baby. She has 3 older children 8916, 35 and 33-year-old who live with their fathers. She was offered [facility] program participation in November but was "sceptical" about it. She now is open to it.   Total Time spent with patient: 1 hour  Is the patient at risk to self? Yes.    Has the patient been a risk to self in the past 6 months? No.  Has the patient been a risk to self within the distant past? Yes.    Is the patient a risk to others? No.  Has the patient been a risk to others in the past 6 months? No.  Has the patient been a risk to others within the distant past? No.   Prior Inpatient Therapy:   Prior Outpatient Therapy:    Alcohol Screening: 1. How often do you have a drink containing alcohol?: Monthly or less 2. How many drinks containing alcohol do you have on a typical day when you are drinking?: 3 or 4 3. How often do you have six or more drinks on one occasion?: Less than monthly AUDIT-C Score: 3 4. How often during the last year have you found that  you were not able to stop drinking once you had started?: Less than monthly 5. How often during the last year have you failed to do what was normally expected from you becasue of drinking?: Less than monthly 6. How often during the last year have you needed a first drink  in the morning to get yourself going after a heavy drinking session?: Less than monthly 7. How often during the last year have you had a feeling of guilt of remorse after drinking?: Less than monthly 8. How often during the last year have you been unable to remember what happened the night before because you had been drinking?: Less than monthly 9. Have you or someone else been injured as a result of your drinking?: No 10. Has a relative or friend or a doctor or another health worker been concerned about your drinking or suggested you cut down?: No Alcohol Use Disorder Identification Test Final Score (AUDIT): 8 Intervention/Follow-up: Alcohol Education Substance Abuse History in the last 12 months:  Yes.   Consequences of Substance Abuse: Negative Previous Psychotropic Medications: Yes  Psychological Evaluations: No  Past Medical History:  Past Medical History:  Diagnosis Date  . Depression   . History of substance abuse   . History of suicide attempt   . History of thyroid disease   . Hypertension   . Thyroid disease     Past Surgical History:  Procedure Laterality Date  . CESAREAN SECTION  2000, 2002, 2013   Family History:  Family History  Problem Relation Age of Onset  . Diabetes Mother    Tobacco Screening: Have you used any form of tobacco in the last 30 days? (Cigarettes, Smokeless Tobacco, Cigars, and/or Pipes): No Social History:  Social History   Substance and Sexual Activity  Alcohol Use Yes   Comment: social drinker     Social History   Substance and Sexual Activity  Drug Use Yes  . Types: Cocaine    Additional Social History:                           Allergies:   Allergies  Allergen Reactions  . Asa [Aspirin] Anaphylaxis  . Banana Anaphylaxis  . Peanut-Containing Drug Products Shortness Of Breath, Itching and Swelling  . Pecan Nut (Diagnostic) Anaphylaxis   Lab Results:  Results for orders placed or performed during the hospital  encounter of 01/01/18 (from the past 48 hour(s))  CBC     Status: Abnormal   Collection Time: 01/02/18  8:02 AM  Result Value Ref Range   WBC 6.8 3.6 - 11.0 K/uL   RBC 3.86 3.80 - 5.20 MIL/uL   Hemoglobin 11.7 (L) 12.0 - 16.0 g/dL   HCT 13.0 (L) 86.5 - 78.4 %   MCV 89.3 80.0 - 100.0 fL   MCH 30.3 26.0 - 34.0 pg   MCHC 33.9 32.0 - 36.0 g/dL   RDW 69.6 29.5 - 28.4 %   Platelets 170 150 - 440 K/uL    Comment: Performed at [facility], [address] [place], [place] [address]    Blood Alcohol level:  Lab Results  Component Value Date   ETH 137 (H) 12/30/2017   ETH 112 (H) 01/11/2016    Metabolic Disorder Labs:  Lab Results  Component Value Date   HGBA1C 4.9 01/12/2016   No results found for: PROLACTIN Lab Results  Component Value Date   CHOL 188 01/12/2016   TRIG 119 01/12/2016  HDL 82 01/12/2016   CHOLHDL 2.3 01/12/2016   VLDL 24 01/12/2016   LDLCALC 82 01/12/2016    Current Medications: Current Facility-Administered Medications  Medication Dose Route Frequency Provider Last Rate Last Dose  . acetaminophen (TYLENOL) tablet 650 mg  650 mg Oral Q6H PRN Clapacs, John T, MD      . alum & mag hydroxide-simeth (MAALOX/MYLANTA) 200-200-20 MG/5ML suspension 30 mL  30 mL Oral Q4H PRN Clapacs, John T, MD      . calcium carbonate (TUMS - dosed in mg elemental calcium) chewable tablet 200 mg of elemental calcium  1 tablet Oral TID PRN Clapacs, Jackquline Denmark, MD      . famotidine (PEPCID) IVPB 20 mg premix  20 mg Intravenous Q12H Pucilowska, Jolanta B, MD      . folic acid (FOLVITE) tablet 1 mg  1 mg Oral Daily Clapacs, John T, MD   1 mg at 01/02/18 1610  . hydrOXYzine (ATARAX/VISTARIL) tablet 25 mg  25 mg Oral TID PRN Clapacs, John T, MD      . magnesium hydroxide (MILK OF MAGNESIA) suspension 30 mL  30 mL Oral Daily PRN Clapacs, John T, MD      . prenatal multivitamin tablet 1 tablet  1 tablet Oral Q1200 Pucilowska, Jolanta B, MD      . senna (SENOKOT) tablet 8.6 mg  1  tablet Oral BID Clapacs, John T, MD      . sertraline (ZOLOFT) tablet 50 mg  50 mg Oral QHS Pucilowska, Jolanta B, MD      . valACYclovir (VALTREX) tablet 500 mg  500 mg Oral Daily Clapacs, John T, MD   500 mg at 01/02/18 0834  . zolpidem (AMBIEN) tablet 5 mg  5 mg Oral QHS PRN Pucilowska, Jolanta B, MD       PTA Medications: Medications Prior to Admission  Medication Sig Dispense Refill Last Dose  . cephALEXin (KEFLEX) 500 MG capsule Take 1 capsule (500 mg total) by mouth 2 (two) times daily. 10 capsule 0   . citalopram (CELEXA) 20 MG tablet Take 1 tablet (20 mg total) by mouth daily. 30 tablet 0   . cloNIDine (CATAPRES) 0.1 MG tablet Take 1 tablet (0.1 mg total) by mouth 2 (two) times daily. 60 tablet 1   . etodolac (LODINE) 500 MG tablet Take 1 tablet (500 mg total) by mouth 2 (two) times daily. 60 tablet 0   . fluticasone (FLONASE) 50 MCG/ACT nasal spray Place 1 spray into both nostrils daily.   11/16/2015 at am  . HYDROcodone-acetaminophen (NORCO) 5-325 MG tablet Take 1 tablet by mouth every 4 (four) hours as needed for moderate pain. 12 tablet 0   . ibuprofen (ADVIL,MOTRIN) 800 MG tablet Take 1 tablet (800 mg total) by mouth every 8 (eight) hours as needed. 15 tablet 0   . ondansetron (ZOFRAN) 4 MG tablet Take 1 tablet (4 mg total) by mouth every 8 (eight) hours as needed for nausea or vomiting. 21 tablet 0   . pantoprazole (PROTONIX) 20 MG tablet Take 1 tablet (20 mg total) by mouth daily. 30 tablet 1   . traMADol (ULTRAM) 50 MG tablet Take 1 tablet (50 mg total) by mouth every 6 (six) hours as needed. 20 tablet 0   . traZODone (DESYREL) 100 MG tablet Take 1 tablet (100 mg total) by mouth at bedtime as needed for sleep. 30 tablet 0     Musculoskeletal: Strength & Muscle Tone: within normal limits Gait & Station: normal, walks with  a limp Patient leans: N/A  Psychiatric Specialty Exam: Physical Exam  Nursing note and vitals reviewed. Constitutional: She is oriented to person,  place, and time. She appears well-developed and well-nourished.  HENT:  Head: Normocephalic and atraumatic.  Eyes: Conjunctivae and EOM are normal. Pupils are equal, round, and reactive to light.  Neck: Normal range of motion. Neck supple.  Cardiovascular: Normal rate, regular rhythm and normal heart sounds.  Respiratory: Effort normal and breath sounds normal.  GI: Soft. Bowel sounds are normal.  Musculoskeletal: Normal range of motion.  Neurological: She is alert and oriented to person, place, and time.  Skin: Skin is warm and dry.  Psychiatric: Her speech is normal and behavior is normal. Cognition and memory are normal. She expresses impulsivity. She exhibits a depressed mood. She expresses suicidal ideation.    Review of Systems  Neurological: Negative.   Psychiatric/Behavioral: Positive for depression, substance abuse and suicidal ideas. The patient has insomnia.   All other systems reviewed and are negative.   Blood pressure (!) 143/58, pulse 61, temperature 98.2 F (36.8 C), temperature source Oral, resp. rate 18, height 4\' 11"  (1.499 m), weight 76.2 kg (168 lb), last menstrual period 03/09/2017, SpO2 100 %.Body mass index is 33.93 kg/m.  See SRA                                                  Sleep:  Number of Hours: 6.45    Treatment Plan Summary: Daily contact with patient to assess and evaluate symptoms and progress in treatment and Medication management   [name] is a 32 year old pregnant female admitted for suicidal ideation in the context of substance abuse.  #Suicidal ideation -patient is able to contract for safety in the hospital  #Mood -discontinue Seroquel -start Zoloft 50 mg nightly -Vistaril is available for anxiety  #Insomnia -Ambien 5 mg PRN  #High-risk pregnancy -fetal tone monitoring per shift -prenatal vitamins with folic acid -double portions -patient getting 17P weekly injections on Tue and missed one this  week -also she is on Valtrex -consult OBGYN  #Substance abuse -patient is now interested in [facility]  #Metabolic syndrome monitoring -Lipid panet, TSH and HgbA1C are pending -EKG, pending  #Disposition -TBE   Observation Level/Precautions:  15 minute checks  Laboratory:  CBC Chemistry Profile UDS UA  Psychotherapy:    Medications:    Consultations:    Discharge Concerns:    Estimated LOS:  Other:     Physician Treatment Plan for Primary Diagnosis: Major depressive disorder, recurrent severe without psychotic features (HCC) Long Term Goal(s): Improvement in symptoms so as ready for discharge  Short Term Goals: Ability to identify changes in lifestyle to reduce recurrence of condition will improve, Ability to verbalize feelings will improve, Ability to disclose and discuss suicidal ideas, Ability to demonstrate self-control will improve, Ability to identify and develop effective coping behaviors will improve, Ability to maintain clinical measurements within normal limits will improve, Compliance with prescribed medications will improve and Ability to identify triggers associated with substance abuse/mental health issues will improve  Physician Treatment Plan for Secondary Diagnosis: Principal Problem:   Major depressive disorder, recurrent severe without psychotic features (HCC) Active Problems:   Tobacco use disorder   Alcohol use disorder, moderate, dependence (HCC)   Pregnant   Cocaine use disorder, moderate, dependence (HCC)   Cannabis use  disorder, moderate, dependence (HCC)  Long Term Goal(s): Improvement in symptoms so as ready for discharge  Short Term Goals: Ability to identify changes in lifestyle to reduce recurrence of condition will improve, Ability to demonstrate self-control will improve and Ability to identify triggers associated with substance abuse/mental health issues will improve  I certify that inpatient services furnished can reasonably be  expected to improve the patient's condition.    Kristine Linea, MD 1/9/20199:21 AM

## 2018-01-02 NOTE — Progress Notes (Signed)
Recreation Therapy Notes  INPATIENT RECREATION THERAPY ASSESSMENT  Patient Details Name: [name] MRN: [id number] DOB: 04/06/1986 Today's Date: 01/02/2018  Patient Stressors: Other (Comment)(Not having my own space and being pregnant)  Coping Skills:   Other (Comment)(Smoking,cooking, Walking)  Personal Challenges: Expressing Yourself, Stress Management, Trusting Others  Leisure Interests (2+):  Individual - TV  Awareness of Community Resources:  No  Community Resources:     Current Use:    If no, Barriers?:    Patient Strengths:  Easy to get along with  Patient Identified Areas of Improvement:  Becoming more independent  Current Recreation Participation:  Walking  Patient Goal for Hospitalization:  Control anxiety  [place]idence:     Current SI (including self-harm):  No  Current HI:  No  Consent to Intern Participation: N/A   [name] 01/02/2018, 3:02 PM

## 2018-01-02 NOTE — [facility] Group Notes (Signed)
[facility] Group Notes:  (Nursing/MHT/Case Management/Adjunct)  Date:  01/02/2018  Time:  3:14 PM  Type of Therapy:  Psychoeducational Skills  Participation Level:  Did Not Attend  Judene CompanionMary  Amos Gaber 01/02/2018, 3:14 PM

## 2018-01-02 NOTE — Progress Notes (Signed)
Recreation Therapy Notes   Date: 01.09.2019  Time: 1:00PM  Location: Craft Room  Behavioral response: Appropriate  Intervention Topic: Creative Expressions  Discussion/Intervention: Group content on today was focused on creative expressions. The group defined creative expressions and ways they use creative expressions. Individual identified other positive ways creative expressions can be used and why it is important to express yourself. Patients participated in the intervention "learning origami", where they had a chance to learn new ways to creatively express themselves. Clinical Observations/Feedback:  Patient came to group late due to unknown reasons.She participated in the intervention and was social with peers and staff during group. Osama Coleson LRT/CTRS         Shenequa Howse 01/02/2018 2:10 PM

## 2018-01-02 NOTE — Plan of Care (Signed)
Irritable , New Admission , Refused Treatment Team . Refusing to talk about situation if homeless or not  Not Progressing Education: Knowledge of disease or condition will improve 01/02/2018 1514 - Not Progressing by Crist InfanteFarrish, Virgal Warmuth A, RN Understanding of discharge needs will improve 01/02/2018 1514 - Not Progressing by Crist InfanteFarrish, Lyndol Vanderheiden A, RN Health Behavior/Discharge Planning: Ability to identify changes in lifestyle to reduce recurrence of condition will improve 01/02/2018 1514 - Not Progressing by Crist InfanteFarrish, Shaunda Tipping A, RN Identification of resources available to assist in meeting health care needs will improve 01/02/2018 1514 - Not Progressing by Crist InfanteFarrish, Nassim Cosma A, RN Education: Ability to make informed decisions regarding treatment will improve 01/02/2018 1514 - Not Progressing by Crist InfanteFarrish, Diesel Lina A, RN Self-Concept: Ability to disclose and discuss suicidal ideas will improve 01/02/2018 1514 - Not Progressing by Crist InfanteFarrish, Nathaneil Feagans A, RN Ability to verbalize positive feelings about self will improve 01/02/2018 1514 - Not Progressing by Crist InfanteFarrish, Hyland Mollenkopf A, RN Activity: Interest or engagement in leisure activities will improve 01/02/2018 1514 - Not Progressing by Crist InfanteFarrish, Canyon Lohr A, RN Imbalance in normal sleep/wake cycle will improve 01/02/2018 1514 - Not Progressing by Crist InfanteFarrish, Rulon Abdalla A, RN Education: Utilization of techniques to improve thought processes will improve 01/02/2018 1514 - Not Progressing by Crist InfanteFarrish, Yusra Ravert A, RN Knowledge of the prescribed therapeutic regimen will improve 01/02/2018 1514 - Not Progressing by Crist InfanteFarrish, Donielle Kaigler A, RN Coping: Ability to cope will improve 01/02/2018 1514 - Not Progressing by Crist InfanteFarrish, Tayvon Culley A, RN Ability to verbalize feelings will improve 01/02/2018 1514 - Not Progressing by Crist InfanteFarrish, Carson Meche A, RN Coping: Ability to identify and develop effective coping behavior will improve 01/02/2018 1514 - Not Progressing by Crist InfanteFarrish, Koren Sermersheim A, RN Education: Knowledge of [facility] Education  information/materials will improve 01/02/2018 1514 - Not Progressing by Crist InfanteFarrish, Zade Falkner A, RN

## 2018-01-02 NOTE — Progress Notes (Signed)
D: Patient stated slept good last night .Stated appetite is good and energy level  Is normal. Stated concentration is good . Stated on Depression scale , 6 hopeless 6 and anxiety 5 .( low 0-10 high) Denies suicidal  homicidal iIrritable , Refused Treatment Team . Refusing to talk about situation if homeless or not  .  No auditory hallucinations  No pain concerns . Appropriate ADL'S. Interacting with peers and staff.  Working on Pharmacologistcoping skills . FHT 165  A: Encourage patient participation with unit programming . Instruction  Given on  Medication , verbalize understanding.  R: Voice no other concerns. Staff continue to monitor

## 2018-01-02 NOTE — Progress Notes (Signed)
Patient ID: [name], female   DOB: 01/21/1986, 32 y.o.   MRN: [id number] CSW verified with pt that she was interested in participating in a SA residential program.  Pt informed CSW that she was interested in going to a SA residential program.  CSW obtained information about [facility] and obtained consent from pt to give screening information to referral counselor. [facility].  CSW was informed that information would be passed on the Program Director who will decided if pt is a good candidate for the program.  Pt would be contacted.  CSW asked that she be contacted as pt cannot receive calls on the unit and that CSW would coordinate the call between pt and the program.

## 2018-01-02 NOTE — [facility] Suicide Risk Assessment (Signed)
[facility]   Nursing information obtained from:    Demographic factors:    Current Mental Status:    Loss Factors:    Historical Factors:    Risk Reduction Factors:     Total Time spent with patient: 1 hour Principal Problem: Major depressive disorder, recurrent severe without psychotic features (HCC) Diagnosis:   Patient Active Problem List   Diagnosis Date Noted  . Major depressive disorder, recurrent severe without psychotic features (HCC) [F33.2] 01/11/2016    Priority: High  . Cocaine use disorder, moderate, dependence (HCC) [F14.20] 01/01/2018  . Cannabis use disorder, moderate, dependence (HCC) [F12.20] 01/01/2018  . Pregnant [Z34.90] 12/31/2017  . Medically noncompliant [Z91.19] 12/31/2017  . Tobacco use disorder [F17.200] 01/12/2016  . Stimulant use disorder (HCC) (cocaine) [F15.90] 01/12/2016  . Alcohol use disorder, moderate, dependence (HCC) [F10.20] 01/12/2016  . Self-inflicted laceration of wrist [S61.519A] 01/11/2016   Subjective Data: suicidal ideation  Continued Clinical Symptoms:  Alcohol Use Disorder Identification Test Final Score (AUDIT): 8 The "Alcohol Use Disorders Identification Test", Guidelines for Use in Primary Care, Second Edition.  World Science writerHealth Organization [place](WHO). Score between 0-7:  no or low risk or alcohol related problems. Score between 8-15:  moderate risk of alcohol related problems. Score between 16-19:  high risk of alcohol related problems. Score 20 or above:  warrants further diagnostic evaluation for alcohol dependence and treatment.   CLINICAL FACTORS:   Depression:   Comorbid alcohol abuse/dependence Impulsivity Insomnia Alcohol/Substance Abuse/Dependencies More than one psychiatric diagnosis Unstable or Poor Therapeutic Relationship Previous Psychiatric Diagnoses and Treatments Medical Diagnoses and Treatments/Surgeries   Musculoskeletal: Strength & Muscle Tone: within normal limits Gait &  Station: normal, walks with a limp Patient leans: N/A  Psychiatric Specialty Exam: Physical Exam  Nursing note and vitals reviewed. Psychiatric: Her speech is normal and behavior is normal. Cognition and memory are normal. She expresses impulsivity. She exhibits a depressed mood. She expresses suicidal ideation.    Review of Systems  Gastrointestinal: Positive for heartburn.  Neurological: Negative.   Psychiatric/Behavioral: Positive for depression, substance abuse and suicidal ideas. The patient has insomnia.   All other systems reviewed and are negative.   Blood pressure (!) 143/58, pulse 61, temperature 98.2 F (36.8 C), temperature source Oral, resp. rate 18, height 4\' 11"  (1.499 m), weight 76.2 kg (168 lb), last menstrual period 03/09/2017, SpO2 100 %.Body mass index is 33.93 kg/m.  General Appearance: Casual  Eye Contact:  Good  Speech:  Clear and Coherent  Volume:  Normal  Mood:  Depressed and Hopeless  Affect:  Appropriate  Thought Process:  Goal Directed and Descriptions of Associations: Intact  Orientation:  Full (Time, Place, and Person)  Thought Content:  WDL  Suicidal Thoughts:  Yes.  without intent/plan  Homicidal Thoughts:  No  Memory:  Immediate;   Fair Recent;   Fair Remote;   Fair  Judgement:  Poor  Insight:  Lacking  Psychomotor Activity:  Normal  Concentration:  Concentration: Fair and Attention Span: Fair  Recall:  [employer]  Fund of Knowledge:  Fair  Language:  Fair  Akathisia:  No  Handed:  Right  AIMS (if indicated):     Assets:  Communication Skills Desire for Improvement Financial Resources/Insurance Housing Physical Health Resilience Social Support  ADL's:  Intact  Cognition:  WNL  Sleep:  Number of Hours: 6.45      COGNITIVE FEATURES THAT CONTRIBUTE TO RISK:  None    SUICIDE RISK:   Moderate:  Frequent  suicidal ideation with limited intensity, and duration, some specificity in terms of plans, no associated intent, good self-control,  limited dysphoria/symptomatology, some risk factors present, and identifiable protective factors, including available and accessible social support.  PLAN OF CARE: hospital admission, medication management, substance abuse counseling, discharge planning.  Ms. Safran is a 32 year old pregnant female admitted for suicidal ideation in the context of substance abuse.  #Suicidal ideation -patient is able to contract for safety in the hospital  #Mood -discontinue Seroquel -start Zoloft 50 mg nightly -Vistaril is available for anxiety  #Insomnia -Ambien 5 mg PRN  #High-risk pregnancy -fetal tone monitoring per shift -prenatal vitamins with folic acid -double portions  #Substance abuse -patient is now interested in [facility] program  #Metabolic syndrome monitoring -Lipid panet, TSH and HgbA1C are pending -EKG, pending  #Disposition -[facility]BE   I certify that inpatient services furnished can reasonably be expected to improve the patient's condition.   Kristine Linea, MD 01/02/2018, 9:13 AM

## 2018-01-02 NOTE — BHH Counselor (Signed)
Adult Comprehensive Assessment  Patient ID: Festus Holtsikki B Ozbun, female   DOB: 02/13/1986, 32 y.o.   MRN: [id number]  Information Source: Information source: Patient  Current Stressors:  Educational / Learning stressors: No educational needs noted Employment / Job issues: Pt is currently unemployed Family Relationships: Pt shared that her primary family relationship is with her mother, but she interacts some with her father and maternal grandmother Surveyor, quantityinancial / Lack of resources (include bankruptcy): Pt relies on her mother for financial support Housing / Lack of housing: Pt has stable housing Physical health (include injuries & life threatening diseases): Pt is [redacted] weeks pregnant Social relationships: Pt shared that she does not have social relationships Substance abuse: Pt admits to recently using cocaine, marijuana, and alcohol Bereavement / Loss: No bereavement/loss issues noted  Living/Environment/Situation:  Living Arrangements: Parent Living conditions (as described by patient or guardian): Pt stated, "it's hard living with me mother because she doesn't do anything around the house and I have to do all the cleaning and cooking" How long has patient lived in current situation?: Pt has lived with her mother for the past year What is atmosphere in current home: Chaotic  Family History:  Marital status: Single Are you sexually active?: Yes What is your sexual orientation?: Heterosexual Has your sexual activity been affected by drugs, alcohol, medication, or emotional stress?: No Does patient have children?: Yes How many children?: 3 How is patient's relationship with their children?: Pt's children were removed from her custody by [place] about a year ago and placed with their fathers.  She shared that she does not see or talk with her children  Childhood History:  By whom was/is the patient raised?: Mother, Grandparents Additional childhood history information: [place] was born [place]  raised [place], [place]  Her parents were married, but separated when she was approximately 474-5 yo.  Description of patient's relationship with caregiver when they were a child: Pt was primarily raised by her mother and her mother's parents (she and her mother lived in their home).  She shared that she was very close to her grandparents.   Patient's description of current relationship with people who raised him/her: Pt shared that her grandfather is deceased. She shared that she doesn't talk with her grandmother much, but she will help her financially on occasion.  Pt has an "okay" relationship with her mother. How were you disciplined when you got in trouble as a child/adolescent?: Pt shared that she received occasional spanking from her mother Does patient have siblings?: Yes Number of Siblings: 1 Description of patient's current relationship with siblings: Pt has one older sister.  She shared that she doesn't talk to her sister. Did patient suffer any verbal/emotional/physical/sexual abuse as a child?: No Did patient suffer from severe childhood neglect?: No Has patient ever been sexually abused/assaulted/raped as an adolescent or adult?: No Was the patient ever a victim of a crime or a disaster?: No Witnessed domestic violence?: No Has patient been effected by domestic violence as an adult?: No  Education:  Highest grade of school patient has completed: 10th grade Currently a student?: No Learning disability?: No  Employment/Work Situation:   Employment situation: Unemployed(Pt shared that she has not worked a job in 2-3 years) Patient's job has been impacted by current illness: No What is the longest time patient has a held a job?: 2 years  Where was the patient employed at that time?: 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?: No Are There Guns or Other Weapons in Your Home?: No Are These Weapons Safely Secured?: No Who Could Verify You Are Able To Have These Secured:: No weapons in the home.  This can be verified by her mother.  Financial Resources:   Surveyor, quantity resources: Support from parents / caregiver, Medicaid, Food stamps Does patient have a Lawyer or guardian?: No  Alcohol/Substance Abuse:   What has been your use of drugs/alcohol within the last 12 months?: Cocaine, alcohol, marijuana If attempted suicide, did drugs/alcohol play a role in this?: No Alcohol/Substance Abuse Treatment Hx: Denies past history If yes, describe treatment: n/a Has alcohol/substance abuse ever caused legal problems?: Yes(Pt shared that she was charged with felony possession of cocaine 10 years ago.  She had to spend 45 days in jail)  Social Support System:   Patient's Community Support System: Poor Describe Community Support System: Pt shared that she doesn't have a community support system Type of faith/religion: Pt shared that she does not identify with an organized religion How does patient's faith help to cope with current illness?: n/a  Leisure/Recreation:   Leisure and Hobbies: Pt shared that she does not have any hobbies.  Strengths/Needs:   What things does the patient do well?: Cook, Clean In what areas does patient struggle / problems for patient: Housing, Unemployment, On-going MI that makes her feel hopeless  Discharge Plan:   Does patient have access to transportation?: Yes(Pt shared that her mother helps with transportation as well as the Medicaid van for medical appointments) Will patient be returning to same living situation after discharge?: Yes Currently receiving community mental health services: No If no, would patient like referral for services when discharged?: Yes (What county?)(Pt would like to be referred to [facility] in [facility] for follow-up services) Does  patient have financial barriers related to discharge medications?: No  Summary/Recommendations:   Summary and Recommendations (to be completed by the evaluator): Pt is a 31 yo female who resides in [place], [place] who presented to the ER with complaints of increasing depression, anxiety, and SI (no plan). Pt is [redacted] weeks pregnant and reported that she had been using cocaine, alcohol, and marijuana prior to coming to the ER.  Pt's BAC was 137.  Pt's illicit substance use and alcohol as well as having a shortened placenta makes her pregnancy high risk.  Pt had a recent inpatient admission at [facility] in November 2018.  She did not follow-up with mental health providers following discharge. Pt was offered a referral for SA residential tx at [facility] following her admission at [facility], but declined the referral. Pt has also been admitted at [facility] in 2016 for worsening depression, anxiety, and SI. Pt has reported having 2 prior suicide attempts.  Pt has been dx with Major Depressive Disorder, Recurrent, Severe Without Psychotic Features.  Recommendations for pt include crisis stabilization, therapeutic milieu, medication management, encouragement of attendance and participation in groups, and the development of a comprehensive wellness plan.  Pt is open to being discharged into a SA residential program.  A referral has been made to [facility].  It pt is not able to be discharged into a residential facility, she will return to her home with her mother with follow-up services at [facility].  CSW will continue to seek residential tx services for pt.  Alease Frame, LCSW 01/02/2018

## 2018-01-02 NOTE — BHH Group Notes (Signed)
  01/02/2018  Time: 0930  Type of Therapy/Topic:  Group Therapy:  Emotion Regulation  Participation Level:  Did Not Attend   Description of Group:    The purpose of this group is to assist patients in learning to regulate negative emotions and experience positive emotions. Patients will be guided to discuss ways in which they have been vulnerable to their negative emotions. These vulnerabilities will be juxtaposed with experiences of positive emotions or situations, and patients will be challenged to use positive emotions to combat negative ones. Special emphasis will be placed on coping with negative emotions in conflict situations, and patients will process healthy conflict resolution skills.  Therapeutic Goals: 1. Patient will identify two positive emotions or experiences to reflect on in order to balance out negative emotions 2. Patient will label two or more emotions that they find the most difficult to experience 3. Patient will demonstrate positive conflict resolution skills through discussion and/or role plays  Summary of Patient Progress: Pt was invited to attend group but chose not to attend. CSW will continue to encourage pt to attend group throughout their admission.    Therapeutic Modalities:   Cognitive Behavioral Therapy Feelings Identification Dialectical Behavioral Therapy   Heidi DachKelsey Maryella Abood, MSW, LCSW 01/02/2018 10:11 AM

## 2018-01-03 MED ORDER — QUETIAPINE FUMARATE 25 MG PO TABS
50.0000 mg | ORAL_TABLET | Freq: Every day | ORAL | Status: DC
  Administered 2018-01-03: 50 mg via ORAL
  Filled 2018-01-03: qty 2

## 2018-01-03 NOTE — Plan of Care (Signed)
  Self-Concept: Ability to disclose and discuss suicidal ideas will improve 01/03/2018 2107 - Progressing by Delos HaringPhillips, Kingstin Heims A, RN Note Pt denies SI at this time

## 2018-01-03 NOTE — Progress Notes (Signed)
D: Patient stated slept fair  last night .Stated appetite is good and energy level  Is normal. Stated concentration is good . Stated on Depression scale 6 hopeless 6 and anxiety7 .( low 0-10 high) Denies suicidal  homicidal ideations  .  No auditory hallucinations  No pain concerns . Appropriate ADL'S. Interacting with peers and staff. Patient is knowledgeable of Disease Process and the effects of substances  abuse on her  pregnancy.  Understanding  of life changes. Assisted with resources  as with [facility] . Denies suicidal ideations . Sleeping  long periods during shift. Received  samples of coping skills   A: Encourage patient participation with unit programming . Instruction  Given on  Medication , verbalize understanding.  R: Voice no other concerns. Staff continue to monitor

## 2018-01-03 NOTE — Progress Notes (Signed)
D: Pt denies SI/HI/AVH. Pt is pleasant and cooperative. Pt stated she felt the same as when she came in, pt said she felt "good", pt isolated to her room this evening. Pt was encouraged to eat, drink and walk the unit throughout the day .   A: Pt was offered support and encouragement. Pt was given scheduled medications. Pt was encourage to attend groups. Q 15 minute checks were done for safety.   R: safety maintained on unit.

## 2018-01-03 NOTE — BHH Suicide Risk Assessment (Signed)
B[facility] INPATIENT:  Family/Significant Other Suicide Prevention Education  Suicide Prevention Education:  Education Completed; Harvie Heckilie Trollinger (mother,  at [phone]) has been identified by the patient as the family member/significant other with whom the patient will be residing, and identified as the person(s) who will aid the patient in the event of a mental health crisis (suicidal ideations/suicide attempt).  With written consent from the patient, the family member/significant other has been provided the following suicide prevention education, prior to the and/or following the discharge of the patient.  The suicide prevention education provided includes the following:  Suicide risk factors  Suicide prevention and interventions  National Suicide Hotline telephone number  [facility] assessment telephone number  [facility] Emergency Assistance 911  [facility] and/or Residential Mobile Crisis Unit telephone number  Request made of family/significant other to:  Remove weapons (e.g., guns, rifles, knives), all items previously/currently identified as safety concern.    Remove drugs/medications (over-the-counter, prescriptions, illicit drugs), all items previously/currently identified as a safety concern.  The family member/significant other verbalizes understanding of the suicide prevention education information provided.  The family member/significant other agrees to remove the items of safety concern listed above.  Alease FrameSonya S Dequon Schnebly, LCSW 01/03/2018, 9:54 AM

## 2018-01-03 NOTE — Progress Notes (Signed)
[facility] MD Progress Note  01/03/2018 4:07 PM JUSTIS DUPAS  MRN:  [id number]  Subjective:   Ms. Difatta still feels very depressed. "Not very suicidal but sad and hopeless". There are no somatic complaints, abdominal pain, spotting or cramping. Fetal VS stable. Fell asleep with Ambien but sleep was interrupted.  Treatment plan. Continue Zoloft for depression. Back to Sroquel 50 mg nightly for mood stabilization and sleep. Continue prenatal vitamins and weekly 17-P injection. She missed appointment with [facility] today. Need to reschedule.  Social/disposition. We made referral to [facility] porgram, awaiting response. Sh may return with her mother. Follow up with [facility] and [facility].  Principal Problem: Major depressive disorder, recurrent severe without psychotic features (HCC) Diagnosis:   Patient Active Problem List   Diagnosis Date Noted  . Major depressive disorder, recurrent severe without psychotic features (HCC) [F33.2] 01/11/2016    Priority: High  . Cocaine use disorder, moderate, dependence (HCC) [F14.20] 01/01/2018  . Cannabis use disorder, moderate, dependence (HCC) [F12.20] 01/01/2018  . Pregnant [Z34.90] 12/31/2017  . Medically noncompliant [Z91.19] 12/31/2017  . Tobacco use disorder [F17.200] 01/12/2016  . Stimulant use disorder (HCC) (cocaine) [F15.90] 01/12/2016  . Alcohol use disorder, moderate, dependence (HCC) [F10.20] 01/12/2016  . Self-inflicted laceration of wrist [S61.519A] 01/11/2016   Total Time spent with patient: 20 minutes  Past Psychiatric History: bipolar disorder, substance abuse  Past Medical History:  Past Medical History:  Diagnosis Date  . Depression   . History of substance abuse   . History of suicide attempt   . History of thyroid disease   . Hypertension   . Thyroid disease     Past Surgical History:  Procedure Laterality Date  . CESAREAN SECTION  2000, 2002, 2013   Family History:  Family History  Problem Relation Age of Onset  . Diabetes  Mother    Family Psychiatric  History: see H&P Social History:  Social History   Substance and Sexual Activity  Alcohol Use Yes   Comment: social drinker     Social History   Substance and Sexual Activity  Drug Use Yes  . Types: Cocaine    Social History   Socioeconomic History  . Marital status: Single    Spouse name: None  . Number of children: None  . Years of education: None  . Highest education level: None  Social Needs  . Financial resource strain: None  . Food insecurity - worry: None  . Food insecurity - inability: None  . Transportation needs - medical: None  . Transportation needs - non-medical: None  Occupational History  . None  Tobacco Use  . Smoking status: Current Every Day Smoker    Packs/day: 0.35  . Smokeless tobacco: Never Used  Substance and Sexual Activity  . Alcohol use: Yes    Comment: social drinker  . Drug use: Yes    Types: Cocaine  . Sexual activity: None  Other Topics Concern  . None  Social History Narrative  . None   Additional Social History:                         Sleep: Poor  Appetite:  Fair  Current Medications: Current Facility-Administered Medications  Medication Dose Route Frequency Provider Last Rate Last Dose  . acetaminophen (TYLENOL) tablet 650 mg  650 mg Oral Q6H PRN Clapacs, Jackquline Denmark, MD   650 mg at 01/03/18 0815  . alum & mag hydroxide-simeth (MAALOX/MYLANTA) 200-200-20 MG/5ML suspension 30 mL  30 mL Oral Q4H PRN Clapacs, Jackquline Denmark, MD      . calcium carbonate (TUMS - dosed in mg elemental calcium) chewable tablet 200 mg of elemental calcium  1 tablet Oral TID PRN Clapacs, Jackquline Denmark, MD      . folic acid (FOLVITE) tablet 1 mg  1 mg Oral Daily Clapacs, Jackquline Denmark, MD   1 mg at 01/03/18 0814  . [START ON 01/09/2018] hydroxyprogesterone caproate (MAKENA) 250 mg/mL injection 250 mg  250 mg Intramuscular Weekly Jakhia Buxton B, MD   250 mg at 01/02/18 2008  . hydroxyprogesterone caproate (MAKENA) 250 mg/mL  injection 250 mg  250 mg Intramuscular Once Sheriden Archibeque B, MD      . hydrOXYzine (ATARAX/VISTARIL) tablet 25 mg  25 mg Oral TID PRN Clapacs, John T, MD      . magnesium hydroxide (MILK OF MAGNESIA) suspension 30 mL  30 mL Oral Daily PRN Clapacs, John T, MD      . pantoprazole (PROTONIX) EC tablet 40 mg  40 mg Oral Daily Makayela Secrest B, MD   40 mg at 01/03/18 0814  . prenatal multivitamin tablet 1 tablet  1 tablet Oral Q1200 Cortavious Nix B, MD   1 tablet at 01/03/18 1218  . QUEtiapine (SEROQUEL) tablet 50 mg  50 mg Oral QHS Maryelizabeth Eberle B, MD      . senna (SENOKOT) tablet 8.6 mg  1 tablet Oral BID Clapacs, Jackquline Denmark, MD   8.6 mg at 01/03/18 0929  . sertraline (ZOLOFT) tablet 50 mg  50 mg Oral QHS Jenella Craigie B, MD   50 mg at 01/02/18 2105    Lab Results:  Results for orders placed or performed during the hospital encounter of 01/01/18 (from the past 48 hour(s))  Hemoglobin A1c     Status: None   Collection Time: 01/02/18  8:02 AM  Result Value Ref Range   Hgb A1c MFr Bld 5.1 4.8 - 5.6 %    Comment: (NOTE) Pre diabetes:          5.7%-6.4% Diabetes:              >6.4% Glycemic control for   <7.0% adults with diabetes    Mean Plasma Glucose 99.67 mg/dL    Comment: Performed at [facility], [address] [place], [place] [address]  Lipid panel     Status: Abnormal   Collection Time: 01/02/18  8:02 AM  Result Value Ref Range   Cholesterol 218 (H) 0 - 200 mg/dL   Triglycerides 604 (H) <150 mg/dL   HDL 81 >54 mg/dL   Total CHOL/HDL Ratio 2.7 RATIO   VLDL 37 0 - 40 mg/dL   LDL Cholesterol 098 (H) 0 - 99 mg/dL    Comment:        Total Cholesterol/HDL:CHD Risk Coronary Heart Disease Risk Table                     Men   Women  1/2 Average Risk   3.4   3.3  Average Risk       5.0   4.4  2 X Average Risk   9.6   7.1  3 X Average Risk  23.4   11.0        Use the calculated Patient Ratio above and the CHD Risk Table to determine the patient's CHD  Risk.        ATP III CLASSIFICATION (LDL):  <100  mg/dL   Optimal  [id number]  mg/dL   Near or Above                    Optimal  130-159  mg/dL   Borderline  [phone]  mg/dL   High  >956>190     mg/dL   Very High Performed at [facility], [address] [place], [place] [address]   TSH     Status: None   Collection Time: 01/02/18  8:02 AM  Result Value Ref Range   TSH 1.650 0.350 - 4.500 uIU/mL    Comment: Performed by a 3rd Generation assay with a functional sensitivity of <=0.01 uIU/mL. Performed at [facility], [address] [place], [place] [address]   CBC     Status: Abnormal   Collection Time: 01/02/18  8:02 AM  Result Value Ref Range   WBC 6.8 3.6 - 11.0 K/uL   RBC 3.86 3.80 - 5.20 MIL/uL   Hemoglobin 11.7 (L) 12.0 - 16.0 g/dL   HCT 69.634.5 (L) 29.535.0 - 28.447.0 %   MCV 89.3 80.0 - 100.0 fL   MCH 30.3 26.0 - 34.0 pg   MCHC 33.9 32.0 - 36.0 g/dL   RDW 13.214.0 44.011.5 - 10.214.5 %   Platelets 170 150 - 440 K/uL    Comment: Performed at [facility], [address] [place], [place] [address]    Blood Alcohol level:  Lab Results  Component Value Date   ETH 137 (H) 12/30/2017   ETH 112 (H) 01/11/2016    Metabolic Disorder Labs: Lab Results  Component Value Date   HGBA1C 5.1 01/02/2018   MPG 99.67 01/02/2018   No results found for: PROLACTIN Lab Results  Component Value Date   CHOL 218 (H) 01/02/2018   TRIG 187 (H) 01/02/2018   HDL 81 01/02/2018   CHOLHDL 2.7 01/02/2018   VLDL 37 01/02/2018   LDLCALC 100 (H) 01/02/2018   LDLCALC 82 01/12/2016    Physical Findings: AIMS: Facial and Oral Movements Muscles of Facial Expression: None, normal Lips and Perioral Area: None, normal Jaw: None, normal Tongue: None, normal,Extremity Movements Upper (arms, wrists, hands, fingers): None, normal Lower (legs, knees, ankles, toes): None, normal, Trunk Movements Neck, shoulders, hips: None, normal, Overall Severity Severity of abnormal movements (highest  score from questions above): None, normal Incapacitation due to abnormal movements: None, normal Patient's awareness of abnormal movements (rate only patient's report): No Awareness, Dental Status Current problems with teeth and/or dentures?: No Does patient usually wear dentures?: No  CIWA:  CIWA-Ar Total: 2 COWS:  COWS Total Score: 2  Musculoskeletal: Strength & Muscle Tone: within normal limits Gait & Station: normal Patient leans: N/A  Psychiatric Specialty Exam: Physical Exam  Nursing note and vitals reviewed. Psychiatric: Her speech is normal and behavior is normal. Her affect is blunt. Cognition and memory are normal. She expresses impulsivity. She expresses suicidal ideation.    Review of Systems  Neurological: Negative.   Psychiatric/Behavioral: Positive for depression, substance abuse and suicidal ideas. The patient has insomnia.   All other systems reviewed and are negative.   Blood pressure (!) 118/52, pulse 64, temperature 97.8 F (36.6 C), temperature source Oral, resp. rate 18, height 4\' 11"  (1.499 m), weight 76.2 kg (168 lb), last menstrual period 03/09/2017, SpO2 100 %.Body mass index is 33.93 kg/m.  General Appearance: Fairly Groomed  Eye Contact:  Good  Speech:  Clear and Coherent  Volume:  Normal  Mood:  Depressed and  Hopeless  Affect:  Blunt  Thought Process:  Goal Directed and Descriptions of Associations: Intact  Orientation:  Full (Time, Place, and Person)  Thought Content:  WDL  Suicidal Thoughts:  Yes.  without intent/plan  Homicidal Thoughts:  No  Memory:  Immediate;   Fair Recent;   Fair Remote;   Fair  Judgement:  Poor  Insight:  Lacking  Psychomotor Activity:  Psychomotor Retardation  Concentration:  Concentration: Fair and Attention Span: Fair  Recall:  [employer] of Knowledge:  Fair  Language:  Fair  Akathisia:  No  Handed:  Right  AIMS (if indicated):     Assets:  Communication Skills Desire for Improvement Financial  Resources/Insurance Housing Physical Health Resilience Social Support  ADL's:  Intact  Cognition:  WNL  Sleep:  Number of Hours: 7     Treatment Plan Summary: Daily contact with patient to assess and evaluate symptoms and progress in treatment and Medication management   Ms. Folden is a 32 year old pregnant female admitted for suicidal ideation in the context of substance abuse.  #Suicidal ideation -patient is able to contract for safety in the hospital  #Mood -start Seroquel 50 mg nightly  -continue Zoloft 50 mg nightly  -Vistaril is available for anxiety  #Insomnia -discontinue Ambien  #High-risk pregnancy -fetal tone monitoring BID -prenatal vitamins with folic acid -double portions -weekly injections of 17-P on Tuesdays -discontinue Valtrex, there is no current outbreak, restart at 36 weeks -consult OBGYN if problems  #Substance abuse -patient is now interested in [facility] program  #Metabolic syndrome monitoring -Lipid panet, TSH and HgbA1C are pending -EKG, pending  #Disposition -TBE    Kristine Linea, MD 01/03/2018, 4:07 PM

## 2018-01-03 NOTE — [facility] Group Notes (Signed)
01/03/2018 9:30AM  Type of Therapy/Topic:  Group Therapy:  Balance in Life  Participation Level:  Did Not Attend  Description of Group:   This group will address the concept of balance and how it feels and looks when one is unbalanced. Patients will be encouraged to process areas in their lives that are out of balance and identify reasons for remaining unbalanced. Facilitators will guide patients in utilizing problem-solving interventions to address and correct the stressor making their life unbalanced. Understanding and applying boundaries will be explored and addressed for obtaining and maintaining a balanced life. Patients will be encouraged to explore ways to assertively make their unbalanced needs known to significant others in their lives, using other group members and facilitator for support and feedback.  Therapeutic Goals: 1. Patient will identify two or more emotions or situations they have that consume much of in their lives. 2. Patient will identify signs/triggers that life has become out of balance:  3. Patient will identify two ways to set boundaries in order to achieve balance in their lives:  4. Patient will demonstrate ability to communicate their needs through discussion and/or role plays  Summary of Patient Progress:  Patient was encouraged and invited to attend group. Patient did not attend group. Social worker will continue to encourage group participation in the future.        Therapeutic Modalities:   Cognitive Behavioral Therapy Solution-Focused Therapy Assertiveness Training  Emry Barbato, LCSW 

## 2018-01-03 NOTE — Plan of Care (Signed)
  Progressing Education: Knowledge of disease or condition will improve 01/03/2018 1505 - Progressing by Crist InfanteFarrish, Nastashia Gallo A, RN Understanding of discharge needs will improve 01/03/2018 1505 - Progressing by Crist InfanteFarrish, Lysha Schrade A, RN Health Behavior/Discharge Planning: Ability to identify changes in lifestyle to reduce recurrence of condition will improve 01/03/2018 1505 - Progressing by Crist InfanteFarrish, Alexei Ey A, RN Identification of resources available to assist in meeting health care needs will improve 01/03/2018 1505 - Progressing by Crist InfanteFarrish, Glendell Schlottman A, RN Education: Ability to make informed decisions regarding treatment will improve 01/03/2018 1505 - Progressing by Crist InfanteFarrish, Raeanna Soberanes A, RN Self-Concept: Ability to disclose and discuss suicidal ideas will improve 01/03/2018 1505 - Progressing by Crist InfanteFarrish, Marquest Gunkel A, RN Ability to verbalize positive feelings about self will improve 01/03/2018 1505 - Progressing by Crist InfanteFarrish, Marquel Pottenger A, RN Activity: Interest or engagement in leisure activities will improve 01/03/2018 1505 - Progressing by Crist InfanteFarrish, Sedalia Greeson A, RN Imbalance in normal sleep/wake cycle will improve 01/03/2018 1505 - Progressing by Crist InfanteFarrish, Cedric Mcclaine A, RN Education: Utilization of techniques to improve thought processes will improve 01/03/2018 1505 - Progressing by Crist InfanteFarrish, Curtis Cain A, RN Knowledge of the prescribed therapeutic regimen will improve 01/03/2018 1505 - Progressing by Crist InfanteFarrish, Donovan Gatchel A, RN Coping: Ability to cope will improve 01/03/2018 1505 - Progressing by Crist InfanteFarrish, Estephany Perot A, RN Ability to verbalize feelings will improve 01/03/2018 1505 - Progressing by Crist InfanteFarrish, Larua Collier A, RN Coping: Ability to identify and develop effective coping behavior will improve 01/03/2018 1505 - Progressing by Crist InfanteFarrish, Brittnee Gaetano A, RN Education: Knowledge of [facility] Education information/materials will improve 01/03/2018 1505 - Progressing by Crist InfanteFarrish, Maddix Heinz A, RN

## 2018-01-03 NOTE — Progress Notes (Signed)
Recreation Therapy Notes   Date: 01.10.2019  Time: 1:00 PM  Location: Craft Room  Behavioral response: Appropriate,Hyperactive, Redirected, Off topic  Intervention Topic: Stress  Discussion/Intervention: Group content on today was focused on stress. The group defined stress and way to cope with stress. Participants expressed how they know when they are stresses out. Individuals described the different ways they have to cope with stress. The group stated reasons why it is important to cope with stress. Patient explained what good stress is and some examples. The group participated in the intervention "Dealing with stress". Individuals had a chance to discuss positive and negative ways to deal with certain stressful situations.  Clinical Observations/Feedback:  Patient came to group and stated her family sometimes stresses her out. She participated in the intervention but had to be redirected to focus on topic and not touch her peers. Individual continues to work towards her goals.  Aaliah Jorgenson LRT/CTRS            Brigido Mera 01/03/2018 1:48 PM

## 2018-01-03 NOTE — Plan of Care (Signed)
  Progressing Self-Concept: Ability to disclose and discuss suicidal ideas will improve 01/03/2018 0013 - Progressing by Galen ManilaVigil, Alberto Pina E, RN Ability to verbalize positive feelings about self will improve 01/03/2018 0013 - Progressing by Galen ManilaVigil, Jeilyn Reznik E, RN Activity: Interest or engagement in leisure activities will improve 01/03/2018 0013 - Progressing by Galen ManilaVigil, Seymore Brodowski E, RN Imbalance in normal sleep/wake cycle will improve 01/03/2018 0013 - Progressing by Galen ManilaVigil, Kassia Demarinis E, RN Coping: Ability to cope will improve 01/03/2018 0013 - Progressing by Galen ManilaVigil, Arminda Foglio E, RN Ability to verbalize feelings will improve 01/03/2018 0013 - Progressing by Galen ManilaVigil, Betta Balla E, RN Coping: Ability to identify and develop effective coping behavior will improve 01/03/2018 0013 - Progressing by Galen ManilaVigil, Kenyon Eichelberger E, RN

## 2018-01-03 NOTE — B[facility] Group Notes (Signed)
[facility] Group Notes:  (Nursing/MHT/Case Management/Adjunct)  Date:  01/03/2018  Time:  5:35 PM  Type of Therapy:  Psychoeducational Skills  Participation Level:  Active  Participation Quality:  Appropriate and Attentive  Affect:  Appropriate  Cognitive:  Alert and Appropriate  Insight:  Appropriate and Good  Engagement in Group:  Engaged  Modes of Intervention:  Discussion and Education  Summary of Progress/Problems:  [name]  [name] 01/03/2018, 5:35 PM

## 2018-01-03 NOTE — B[facility] Group Notes (Signed)
  01/03/2018  Time: 0900  Type of Therapy and Topic:  Group Therapy:  Setting Goals Participation Level:  Did Not Attend  Description of Group: In this process group, patients discussed using strengths to work toward goals and address challenges.  Patients identified two positive things about themselves and one goal they were working on.  Patients were given the opportunity to share openly and support each other's plan for self-empowerment.  The group discussed the value of gratitude and were encouraged to have a daily reflection of positive characteristics or circumstances.  Patients were encouraged to identify a plan to utilize their strengths to work on current challenges and goals.  Therapeutic Goals 1. Patient will verbalize personal strengths/positive qualities and relate how these can assist with achieving desired personal goals 2. Patients will verbalize affirmation of peers plans for personal change and goal setting 3. Patients will explore the value of gratitude and positive focus as related to successful achievement of goals 4. Patients will verbalize a plan for regular reinforcement of personal positive qualities and circumstances.  Summary of Patient Progress: Pt was invited to attend group but chose not to attend. CSW will continue to encourage pt to attend group throughout their admission.     Therapeutic Modalities Cognitive Behavioral Therapy Motivational Interviewing  Heidi DachKelsey Jennalynn Rivard, MSW, LCSW 01/03/2018 9:27 AM

## 2018-01-03 NOTE — Progress Notes (Addendum)
Patient found in day room upon my arrival. Patient is visible and social throughout the evening. Complains of pain at previous injection site in left UE, given Tylenol with positive results. Reports poor sleep last night, given Zolpidem with positive results. Fetal heart tones completed and charted- 148bpm. Patient continues to report depression but affect and presentation are happy and silly. Denies active SI at this time and agrees to contract for safety. Compliant with HS medications and staff direction. Hydroxyprogesterone 250mg  IM administered to right gluteal area. Patient tolerated well. Reports eating and voiding adequately. Denies HI and AVH. Q 15 minute checks maintained. Will continue to monitor throughout the shift. Patient slept 7.5 hours. Reports sleeping well. Compliant with am vitals. Will endorse care to oncoming shift.

## 2018-01-04 MED ORDER — FOLIC ACID 1 MG PO TABS: 1.0000 mg | ORAL_TABLET | Freq: Every day | ORAL | 1 refills | Status: DC

## 2018-01-04 MED ORDER — SERTRALINE HCL 50 MG PO TABS: 50.0000 mg | ORAL_TABLET | Freq: Every day | ORAL | 1 refills | Status: DC

## 2018-01-04 MED ORDER — QUETIAPINE FUMARATE 50 MG PO TABS: 50.0000 mg | ORAL_TABLET | Freq: Every day | ORAL | 1 refills | Status: DC

## 2018-01-04 MED ORDER — HYDROXYPROGESTERONE CAPROATE 250 MG/ML IM OIL: 250.0000 mg | TOPICAL_OIL | INTRAMUSCULAR | 0 refills | Status: DC

## 2018-01-04 MED ORDER — PRENATAL MULTIVITAMIN CH: 1.0000 | ORAL_TABLET | Freq: Every day | ORAL | 1 refills | Status: DC

## 2018-01-04 MED ORDER — SENNA 8.6 MG PO TABS: 1.0000 | ORAL_TABLET | Freq: Two times a day (BID) | ORAL | 1 refills | Status: DC

## 2018-01-04 NOTE — Discharge Summary (Addendum)
Physician Discharge Summary Note  Patient:  [name] is an 32 y.o., female MRN:  [id number] DOB:  11/10/1986 Patient phone:  [phone] (home)  Patient address:   [address] [name] [address],  Total Time spent with patient: 30 minutes  Date of Admission:  01/01/2018 Date of Discharge: 01/04/2018  Reason for Admission:  Suicidal ideation  Identifying data. [name] is a 32 year old pregnant female with a history of depression and substance abuse.  Chief complaint. "I am depressed and hopeless."  History of present illness. Information was obtained from the patient and the chart. The patient came to the ER complaining of worsening of depression, severe anxiety and suicidal ideation. She was hospitalized at [facility] in November in a similar scenario but did not follow up with metal health providers or took prescribed medications. Moreover, she relapsen on cocaine, cannabis and alcohol. She reports many symptoms of depression with poor sleep, decreased appetite, anhedonia, feeling of guilt hopelessness, helplessness, poor energy and concentration, social isolation, crying spells, heighten anxiety that culminated in suicidal thinking. She denies psychotic symptoms or symptoms suggestive of bipolar mania. She started worrying about the safety of the baby and came to the hospital. [name] is high risk pregnancy due to substance abuse and shortened placenta.  Past Psychiatric history. Long history of depression, anxiety, mood instability and substance abuse. She has been hospitalized in the past and tried on numerous medications which she can't name. She was given Seroquel 25 mg nightly at [facility] but did not feel it was helpful, even for sleep. She reports two suicide attempts by overdose and hanging when placed in foster care years ago. She remembers being treated for depression during one of her pregnancies.   Family psychiatric history. None reported.  Social history. She now lives  with her biological mother. They have decent relationship. There is no involvement from the father of the baby. She has 3 older children 81, 23 and 53-year-old who live with their fathers. She was offered HORIZON program participation in November but was "sceptical" about it. She now is open to it.   Principal Problem: Major depressive disorder, recurrent severe without psychotic features [facility]) Discharge Diagnoses: Patient Active Problem List   Diagnosis Date Noted  . Major depressive disorder, recurrent severe without psychotic features (HCC) [F33.2] 01/11/2016    Priority: High  . Cocaine use disorder, moderate, dependence (HCC) [F14.20] 01/01/2018  . Cannabis use disorder, moderate, dependence (HCC) [F12.20] 01/01/2018  . Pregnant [Z34.90] 12/31/2017  . Medically noncompliant [Z91.19] 12/31/2017  . Tobacco use disorder [F17.200] 01/12/2016  . Stimulant use disorder (HCC) (cocaine) [F15.90] 01/12/2016  . Alcohol use disorder, moderate, dependence (HCC) [F10.20] 01/12/2016  . Self-inflicted laceration of wrist [S61.519A] 01/11/2016    Past Medical History:  Past Medical History:  Diagnosis Date  . Depression   . History of substance abuse   . History of suicide attempt   . History of thyroid disease   . Hypertension   . Thyroid disease     Past Surgical History:  Procedure Laterality Date  . CESAREAN SECTION  2000, 2002, 2013   Family History:  Family History  Problem Relation Age of Onset  . Diabetes Mother    Social History:  Social History   Substance and Sexual Activity  Alcohol Use Yes   Comment: social drinker     Social History   Substance and Sexual Activity  Drug Use Yes  . Types: Cocaine    Social History  Socioeconomic History  . Marital status: Single    Spouse name: None  . Number of children: None  . Years of education: None  . Highest education level: None  Social Needs  . Financial resource strain: None  . Food insecurity - worry: None  .  Food insecurity - inability: None  . Transportation needs - medical: None  . Transportation needs - non-medical: None  Occupational History  . None  Tobacco Use  . Smoking status: Current Every Day Smoker    Packs/day: 0.35  . Smokeless tobacco: Never Used  Substance and Sexual Activity  . Alcohol use: Yes    Comment: social drinker  . Drug use: Yes    Types: Cocaine  . Sexual activity: None  Other Topics Concern  . None  Social History Narrative  . None    Hospital Course:    [name] is a 32 year old pregnant female admitted for suicidal ideation in the context of substance abuse. Suicidal ideation have resolved. The patient is able to contract for safety. She is forward thinking and optimistic about the future.  #Mood -continue Seroquel 50 mg nightly  -continue Zoloft 50 mg nightly  -Vistaril is available for anxiety  #High-risk pregnancy -prenatal vitamins with folic acid -weekly injections of 17-P on Tuesdays  #Substance abuse -patient is now interested in treatment -[facility] -[facility]  #Metabolic syndrome monitoring -Lipid panel shows elevated cholesterol and TG, normal TSH and HgbA1C -EKG, QTc 450  #Disposition -discharge with the mother -follow up with [facility] -[facility] -[facility]  Physical Findings: AIMS: Facial and Oral Movements Muscles of Facial Expression: None, normal Lips and Perioral Area: None, normal Jaw: None, normal Tongue: None, normal,Extremity Movements Upper (arms, wrists, hands, fingers): None, normal Lower (legs, knees, ankles, toes): None, normal, Trunk Movements Neck, shoulders, hips: None, normal, Overall Severity Severity of abnormal movements (highest score from questions above): None, normal Incapacitation due to abnormal movements: None, normal Patient's awareness of abnormal movements (rate only patient's report): No Awareness, Dental  Status Current problems with teeth and/or dentures?: No Does patient usually wear dentures?: No  CIWA:  CIWA-Ar Total: 2 COWS:  COWS Total Score: 2  Musculoskeletal: Strength & Muscle Tone: within normal limits Gait & Station: normal Patient leans: N/A  Psychiatric Specialty Exam: Physical Exam  Nursing note and vitals reviewed. Psychiatric: She has a normal mood and affect. Her speech is normal and behavior is normal. Judgment and thought content normal. Cognition and memory are normal.    Review of Systems  Neurological: Negative.   Psychiatric/Behavioral: Negative.   All other systems reviewed and are negative.   Blood pressure 127/63, pulse 72, temperature 97.8 F (36.6 C), temperature source Oral, resp. rate 18, height 4\' 11"  (1.499 m), weight 76.2 kg (168 lb), last menstrual period 03/09/2017, SpO2 100 %.Body mass index is 33.93 kg/m.  General Appearance: Casual  Eye Contact:  Good  Speech:  Clear and Coherent  Volume:  Normal  Mood:  Euthymic  Affect:  Appropriate  Thought Process:  Goal Directed and Descriptions of Associations: Intact  Orientation:  Full (Time, Place, and Person)  Thought Content:  WDL  Suicidal Thoughts:  No  Homicidal Thoughts:  No  Memory:  Immediate;   Fair Recent;   Fair Remote;   Fair  Judgement:  Impaired  Insight:  Shallow  Psychomotor Activity:  Normal  Concentration:  Concentration: Fair and Attention Span: Fair  Recall:  Jennelle Human of Knowledge:  Fair  Language:  Fair  Akathisia:  No  Handed:  Left  AIMS (if indicated):     Assets:  Communication Skills Desire for Improvement Financial Resources/Insurance Housing Physical Health Resilience Social Support  ADL's:  Intact  Cognition:  WNL  Sleep:  Number of Hours: 7     Have you used any form of tobacco in the last 30 days? (Cigarettes, Smokeless Tobacco, Cigars, and/or Pipes): No  Has this patient used any form of tobacco in the last 30 days? (Cigarettes, Smokeless  Tobacco, Cigars, and/or Pipes) Yes, No  Blood Alcohol level:  Lab Results  Component Value Date   ETH 137 (H) 12/30/2017   ETH 112 (H) 01/11/2016    Metabolic Disorder Labs:  Lab Results  Component Value Date   HGBA1C 5.1 01/02/2018   MPG 99.67 01/02/2018   No results found for: PROLACTIN Lab Results  Component Value Date   CHOL 218 (H) 01/02/2018   TRIG 187 (H) 01/02/2018   HDL 81 01/02/2018   CHOLHDL 2.7 01/02/2018   VLDL 37 01/02/2018   LDLCALC 100 (H) 01/02/2018   LDLCALC 82 01/12/2016    See Psychiatric Specialty Exam and Suicide Risk Assessment completed by Attending Physician prior to discharge.  Discharge destination:  Home  Is patient on multiple antipsychotic therapies at discharge:  No   Has Patient had three or more failed trials of antipsychotic monotherapy by history:  No  Recommended Plan for Multiple Antipsychotic Therapies: NA  Discharge Instructions    Diet - low sodium heart healthy   Complete by:  As directed    Increase activity slowly   Complete by:  As directed      Allergies as of 01/04/2018      Reactions   Asa [aspirin] Anaphylaxis   Banana Anaphylaxis   Peanut-containing Drug Products Shortness Of Breath, Itching, Swelling   Pecan Nut (diagnostic) Anaphylaxis      Medication List    STOP taking these medications   cephALEXin 500 MG capsule Commonly known as:  KEFLEX   citalopram 20 MG tablet Commonly known as:  CELEXA   cloNIDine 0.1 MG tablet Commonly known as:  CATAPRES   etodolac 500 MG tablet Commonly known as:  LODINE   fluticasone 50 MCG/ACT nasal spray Commonly known as:  FLONASE   HYDROcodone-acetaminophen 5-325 MG tablet Commonly known as:  NORCO   ibuprofen 800 MG tablet Commonly known as:  ADVIL,MOTRIN   ondansetron 4 MG tablet Commonly known as:  ZOFRAN   traMADol 50 MG tablet Commonly known as:  ULTRAM   traZODone 100 MG tablet Commonly known as:  DESYREL     TAKE these medications      Indication  folic acid 1 MG tablet Commonly known as:  FOLVITE Take 1 tablet (1 mg total) by mouth daily. Start taking on:  01/05/2018  Indication:  Birth Defects of the Neural Tube   hydroxyprogesterone caproate 250 mg/mL Oil injection Commonly known as:  MAKENA Inject 1 mL (250 mg total) into the muscle once a week. On Tuesdays Start taking on:  01/09/2018  Indication:  Early Labor   pantoprazole 20 MG tablet Commonly known as:  PROTONIX Take 1 tablet (20 mg total) by mouth daily.  Indication:  Heartburn   prenatal multivitamin Tabs tablet Take 1 tablet by mouth daily at 12 noon.  Indication:  Pregnancy   QUEtiapine 50 MG tablet Commonly known as:  SEROQUEL Take 1 tablet (50  mg total) by mouth at bedtime.  Indication:  Depressive Phase of Manic-Depression   senna 8.6 MG Tabs tablet Commonly known as:  SENOKOT Take 1 tablet (8.6 mg total) by mouth 2 (two) times daily at 10 AM and 5 PM.  Indication:  Constipation   sertraline 50 MG tablet Commonly known as:  ZOLOFT Take 1 tablet (50 mg total) by mouth at bedtime.  Indication:  Major Depressive Disorder        Follow-up recommendations:  Activity:  as tolerated Diet:  regular Other:  keep follow up appointments  Comments:    Signed: Kristine Linea, MD 01/04/2018, 11:33 AM

## 2018-01-04 NOTE — BHH Group Notes (Signed)
01/04/2018 9:30AM  Type of Therapy and Topic:  Group Therapy:  Feelings around Relapse and Recovery  Participation Level:  Did Not Attend   Description of Group:    Patients in this group will discuss emotions they experience before and after a relapse. They will process how experiencing these feelings, or avoidance of experiencing them, relates to having a relapse. Facilitator will guide patients to explore emotions they have related to recovery. Patients will be encouraged to process which emotions are more powerful. They will be guided to discuss the emotional reaction significant others in their lives may have to patients' relapse or recovery. Patients will be assisted in exploring ways to respond to the emotions of others without this contributing to a relapse.  Therapeutic Goals: 1. Patient will identify two or more emotions that lead to a relapse for them 2. Patient will identify two emotions that result when they relapse 3. Patient will identify two emotions related to recovery 4. Patient will demonstrate ability to communicate their needs through discussion and/or role plays   Summary of Patient Progress: Patient was encouraged and invited to attend group. Patient did not attend group. Social worker will continue to encourage group participation in the future.      Therapeutic Modalities:   Cognitive Behavioral Therapy Solution-Focused Therapy Assertiveness Training Relapse Prevention Therapy   Richy Spradley, LCSW 01/04/2018 10:48 AM   

## 2018-01-04 NOTE — Progress Notes (Signed)
Patient fetal heart tones 163

## 2018-01-04 NOTE — Progress Notes (Signed)
Patient ID: [name], female   DOB: 07/09/1986, 32 y.o.   MRN: [id number]  Discharge Note:  Patient denies SI/HI/AVH at this time. Discharge instructions, AVS, transition record, and prescriptions gone over with patiet. Patient belongings returned to patient. Patient agrees to comply with medication management, follow-up visit, and outpatient therapy. Patient questions and concerns addressed and answered. Patient ambulatory off unit. Patient discharged to home with mother.

## 2018-01-04 NOTE — Progress Notes (Signed)
[name] Fetal Heart Tones 163

## 2018-01-04 NOTE — Progress Notes (Signed)
Recreation Therapy Notes  INPATIENT RECREATION TR PLAN  Patient Details Name: [name] MRN: [id number] DOB: 24-Feb-1986 Today's Date: 01/04/2018  Rec Therapy Plan Is patient appropriate for Therapeutic Recreation?: Yes Treatment times per week: at least 3 Estimated Length of Stay: 5-7 days TR Treatment/Interventions: Group participation (Comment)  Discharge Criteria Pt will be discharged from therapy if:: Discharged Treatment plan/goals/alternatives discussed and agreed upon by:: Patient/family  Discharge Summary Short term goals set: Patient will identify 3 triggers to anxiety x5 days.  Short term goals met: Adequate for discharge Progress toward goals comments: Groups attended Which groups?: Stress management, Leisure education Reason goals not met: N/A Therapeutic equipment acquired: N/A Reason patient discharged from therapy: Discharge from hospital Pt/family agrees with progress & goals achieved: Yes Date patient discharged from therapy: 01/04/18   Aylen Stradford 01/04/2018, 1:43 PM

## 2018-01-04 NOTE — Progress Notes (Signed)
Recreation Therapy Notes  Date: 01.11.2019  Time: 1:00 PM  Location: Craft Room  Behavioral response: Appropriate  Intervention Topic: Leisure  Discussion/Intervention: Group content today was focused on leisure. The group defined what leisure is and some positive leisure activities they participate in. Individuals identified the difference between good and bad leisure. Participants expressed how they feel after participating in the leisure of their choice. The group discussed how they go about picking a leisure activity and if others are involved in their leisure activities. The patient stated how many leisure activities they too choose from and reasons why it is important to have leisure time. Individuals participated in the intervention "Leisure Jeopardy" where they had a chance to identify new leisure activities as well as benefits of leisure. Clinical Observations/Feedback:  Patient came to group and stated she likes to eat and sleep during her free time. Individual stated she picks a leisure to do based on her mood. She was pulled from group due to her ride arriving to pick her up. Samul Mcinroy LRT/CTRS         Tabari Volkert 01/04/2018 1:37 PM

## 2018-01-04 NOTE — Progress Notes (Signed)
  [facility] Discharge [facility] :  Will you be returning to the same living situation after discharge:  Yes,  Pt will be returning to her apartment. At discharge, do you have transportation home?: Yes,  Pt's mother will be picking her up following discharge. Do you have the ability to pay for your medications: Yes,  Pt has financial resources to pay for her medications.  Release of information consent forms completed and in the chart;  Patient's signature needed at discharge.  Patient to Follow up at: Follow-up Information    [facility] Follow up on 01/11/2018.   Why:  You will meet with Unk PintoHarvey Bryant at 7:15AM.  You can contact Mr. Beverely PaceBryant at [phone] if you have any questions are unable to make this appointment in order to re-schedule. Contact information: [address] [place] [address] [phone]           Next level of care provider has access to [facility] Link:no  Safety Planning and Suicide Prevention discussed: Yes,  No safety concerns noted by pt or her support person.  Have you used any form of tobacco in the last 30 days? (Cigarettes, Smokeless Tobacco, Cigars, and/or Pipes): No  Has patient been referred to the Quitline?: N/A patient is not a smoker  Patient has been referred for addiction treatment: Yes  Alease FrameSonya S Greogory Cornette, LCSW 01/04/2018, 11:39 AM

## 2018-01-04 NOTE — BHH Suicide Risk Assessment (Signed)
[facility]   Principal Problem: Major depressive disorder, recurrent severe without psychotic features [facility]HCC) Discharge Diagnoses:  Patient Active Problem List   Diagnosis Date Noted  . Major depressive disorder, recurrent severe without psychotic features (HCC) [F33.2] 01/11/2016    Priority: High  . Cocaine use disorder, moderate, dependence (HCC) [F14.20] 01/01/2018  . Cannabis use disorder, moderate, dependence (HCC) [F12.20] 01/01/2018  . Pregnant [Z34.90] 12/31/2017  . Medically noncompliant [Z91.19] 12/31/2017  . Tobacco use disorder [F17.200] 01/12/2016  . Stimulant use disorder (HCC) (cocaine) [F15.90] 01/12/2016  . Alcohol use disorder, moderate, dependence (HCC) [F10.20] 01/12/2016  . Self-inflicted laceration of wrist [S61.519A] 01/11/2016    Total Time spent with patient: 30 minutes  Musculoskeletal: Strength & Muscle Tone: within normal limits Gait & Station: normal Patient leans: N/A  Psychiatric Specialty Exam: Review of Systems  Neurological: Negative.   Psychiatric/Behavioral: Negative.   All other systems reviewed and are negative.   Blood pressure 127/63, pulse 72, temperature 97.8 F (36.6 C), temperature source Oral, resp. rate 18, height 4\' 11"  (1.499 m), weight 76.2 kg (168 lb), last menstrual period 03/09/2017, SpO2 100 %.Body mass index is 33.93 kg/m.  General Appearance: Casual  Eye Contact::  Good  Speech:  Clear and Coherent409  Volume:  Normal  Mood:  Euthymic  Affect:  Appropriate  Thought Process:  Goal Directed and Descriptions of Associations: Intact  Orientation:  Full (Time, Place, and Person)  Thought Content:  WDL  Suicidal Thoughts:  No  Homicidal Thoughts:  No  Memory:  Immediate;   Fair Recent;   Fair Remote;   Fair  Judgement:  Poor  Insight:  Shallow  Psychomotor Activity:  Normal  Concentration:  Fair  Recall:  FiservFair  Fund of Knowledge:Fair  Language: Fair  Akathisia:  No  Handed:  Right   AIMS (if indicated):     Assets:  Communication Skills Desire for Improvement Financial Resources/Insurance Housing Physical Health Resilience Social Support  Sleep:  Number of Hours: 7  Cognition: WNL  ADL's:  Intact   Mental Status Per Nursing Assessment::   On Admission:     Demographic Factors:  Low socioeconomic status and Unemployed  Loss Factors: NA  Historical Factors: Prior suicide attempts, Family history of mental illness or substance abuse and Impulsivity  Risk Reduction Factors:   Pregnancy, Living with another person, especially a relative, Positive social support and Positive therapeutic relationship  Continued Clinical Symptoms:  Depression:   Comorbid alcohol abuse/dependence Impulsivity Alcohol/Substance Abuse/Dependencies  Cognitive Features That Contribute To Risk:  None    Suicide Risk:  Minimal: No identifiable suicidal ideation.  Patients presenting with no risk factors but with morbid ruminations; may be classified as minimal risk based on the severity of the depressive symptoms    Plan Of Care/Follow-up recommendations:  Activity:  as tolerated Diet:  regular Other:  keep follow up appointments  Kristine LineaJolanta Captain Blucher, MD 01/04/2018, 11:19 AM

## 2018-01-04 NOTE — Progress Notes (Signed)
This Clinical research associatewriter spoke with Mindy in Labor&Delivery/Birthplace about assessing patient's fetal heart tones and she told this Clinical research associatewriter that someone would be down to do them.

## 2018-01-06 ENCOUNTER — Other Ambulatory Visit: Payer: Self-pay

## 2018-01-06 ENCOUNTER — Emergency Department
Admission: EM | Admit: 2018-01-06 | Discharge: 2018-01-07 | Disposition: A | Discharge status: Home / Self Care | Payer: Medicaid Other | Attending: Emergency Medicine | Admitting: Emergency Medicine

## 2018-01-06 ENCOUNTER — Encounter: Payer: Self-pay | Admitting: Emergency Medicine

## 2018-01-06 DIAGNOSIS — F1490 Cocaine use, unspecified, uncomplicated: Secondary | ICD-10-CM | POA: Diagnosis not present

## 2018-01-06 DIAGNOSIS — Z91010 Allergy to peanuts: Secondary | ICD-10-CM | POA: Diagnosis not present

## 2018-01-06 DIAGNOSIS — Z79899 Other long term (current) drug therapy: Secondary | ICD-10-CM | POA: Diagnosis not present

## 2018-01-06 DIAGNOSIS — O9A412 Sexual abuse complicating pregnancy, second trimester: Secondary | ICD-10-CM | POA: Diagnosis not present

## 2018-01-06 DIAGNOSIS — F17200 Nicotine dependence, unspecified, uncomplicated: Secondary | ICD-10-CM | POA: Diagnosis not present

## 2018-01-06 DIAGNOSIS — F1290 Cannabis use, unspecified, uncomplicated: Secondary | ICD-10-CM | POA: Insufficient documentation

## 2018-01-06 DIAGNOSIS — O99322 Drug use complicating pregnancy, second trimester: Secondary | ICD-10-CM | POA: Diagnosis not present

## 2018-01-06 DIAGNOSIS — M25571 Pain in right ankle and joints of right foot: Secondary | ICD-10-CM | POA: Insufficient documentation

## 2018-01-06 DIAGNOSIS — T7421XA Adult sexual abuse, confirmed, initial encounter: Secondary | ICD-10-CM

## 2018-01-06 DIAGNOSIS — O9A312 Physical abuse complicating pregnancy, second trimester: Secondary | ICD-10-CM | POA: Diagnosis not present

## 2018-01-06 DIAGNOSIS — Z3A24 24 weeks gestation of pregnancy: Secondary | ICD-10-CM | POA: Insufficient documentation

## 2018-01-06 DIAGNOSIS — O99332 Smoking (tobacco) complicating pregnancy, second trimester: Secondary | ICD-10-CM | POA: Insufficient documentation

## 2018-01-06 MED ORDER — ONDANSETRON HCL 4 MG/2ML IJ SOLN
4.0000 mg | Freq: Once | INTRAMUSCULAR | Status: AC
  Administered 2018-01-06: 4 mg via INTRAVENOUS

## 2018-01-06 MED ORDER — MORPHINE SULFATE (PF) 2 MG/ML IV SOLN
INTRAVENOUS | Status: AC
  Filled 2018-01-06: qty 1

## 2018-01-06 MED ORDER — ONDANSETRON HCL 4 MG/2ML IJ SOLN
INTRAMUSCULAR | Status: AC
  Filled 2018-01-06: qty 2

## 2018-01-06 MED ORDER — MORPHINE SULFATE (PF) 2 MG/ML IV SOLN
2.0000 mg | Freq: Once | INTRAVENOUS | Status: AC
  Administered 2018-01-06: 2 mg via INTRAVENOUS
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
## 2018-01-07 ENCOUNTER — Emergency Department: Payer: Medicaid Other

## 2018-01-07 LAB — URINALYSIS, COMPLETE (UACMP) WITH MICROSCOPIC
BACTERIA UA: NONE SEEN
BILIRUBIN URINE: NEGATIVE
Glucose, UA: NEGATIVE mg/dL
Hgb urine dipstick: NEGATIVE
KETONES UR: NEGATIVE mg/dL
LEUKOCYTES UA: NEGATIVE
Nitrite: NEGATIVE
PROTEIN: NEGATIVE mg/dL
Specific Gravity, Urine: 1.006 (ref 1.005–1.030)
pH: 5 (ref 5.0–8.0)

## 2018-01-07 LAB — HCG, QUANTITATIVE, PREGNANCY: hCG, Beta Chain, Quant, S: 8663 m[IU]/mL — ABNORMAL HIGH (ref <5)

## 2018-01-07 LAB — URINE DRUG SCREEN, QUALITATIVE (ARMC ONLY)
Amphetamines, Ur Screen: NOT DETECTED
BARBITURATES, UR SCREEN: NOT DETECTED
Benzodiazepine, Ur Scrn: NOT DETECTED
CANNABINOID 50 NG, UR ~~LOC~~: POSITIVE — AB
COCAINE METABOLITE, UR ~~LOC~~: POSITIVE — AB
MDMA (Ecstasy)Ur Screen: NOT DETECTED
Methadone Scn, Ur: NOT DETECTED
Opiate, Ur Screen: POSITIVE — AB
PHENCYCLIDINE (PCP) UR S: NOT DETECTED
TRICYCLIC, UR SCREEN: NOT DETECTED

## 2018-01-07 LAB — ABO/RH: ABO/RH(D): O POS

## 2018-01-07 LAB — ETHANOL: ALCOHOL ETHYL (B): 129 mg/dL — AB (ref <10)

## 2018-01-07 MED ORDER — ONDANSETRON HCL 4 MG/2ML IJ SOLN
4.0000 mg | Freq: Once | INTRAMUSCULAR | Status: AC
  Administered 2018-01-07: 4 mg via INTRAVENOUS
  Filled 2018-01-07: qty 2

## 2018-01-07 MED ORDER — CEFTRIAXONE SODIUM 250 MG IJ SOLR: 250.0000 mg | Freq: Once | INTRAMUSCULAR | Status: DC

## 2018-01-07 MED ORDER — LIDOCAINE 5 % EX PTCH
1.0000 | MEDICATED_PATCH | CUTANEOUS | Status: DC
  Administered 2018-01-07: 1 via TRANSDERMAL

## 2018-01-07 MED ORDER — LIDOCAINE 5 % EX PTCH
MEDICATED_PATCH | CUTANEOUS | Status: AC
  Filled 2018-01-07: qty 1

## 2018-01-07 MED ORDER — MORPHINE SULFATE (PF) 2 MG/ML IV SOLN
2.0000 mg | Freq: Once | INTRAVENOUS | Status: AC
  Administered 2018-01-07: 2 mg via INTRAVENOUS

## 2018-01-07 MED ORDER — AZITHROMYCIN 500 MG PO TABS: 1000.0000 mg | ORAL_TABLET | Freq: Once | ORAL | Status: DC

## 2018-01-07 MED ORDER — METRONIDAZOLE 500 MG PO TABS
2000.0000 mg | ORAL_TABLET | Freq: Once | ORAL | Status: DC
Start: 2018-01-07 — End: 2018-01-07

## 2018-01-07 MED ORDER — MORPHINE SULFATE (PF) 2 MG/ML IV SOLN
INTRAVENOUS | Status: DC
Start: 2018-01-07 — End: 2018-01-07
  Filled 2018-01-07: qty 1
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
## 2018-01-07 NOTE — ED Notes (Signed)
[facility] in room and talking to patient.

## 2018-01-07 NOTE — ED Notes (Signed)
SANE nurse present in room. 

## 2018-01-08 ENCOUNTER — Emergency Department: Payer: Medicaid Other

## 2018-01-08 ENCOUNTER — Emergency Department
Admission: EM | Admit: 2018-01-08 | Discharge: 2018-01-08 | Disposition: A | Discharge status: Home / Self Care | Payer: Medicaid Other | Attending: Student in an Organized Health Care Education/Training Program | Admitting: Student in an Organized Health Care Education/Training Program

## 2018-01-08 ENCOUNTER — Ambulatory Visit
Admission: EM | Admit: 2018-01-08 | Discharge: 2018-01-08 | Disposition: A | Discharge status: Home / Self Care | Payer: No Typology Code available for payment source | Source: Ambulatory Visit | Attending: Emergency Medicine | Admitting: Emergency Medicine

## 2018-01-08 ENCOUNTER — Encounter: Payer: Self-pay | Admitting: Medical Oncology

## 2018-01-08 DIAGNOSIS — Z79899 Other long term (current) drug therapy: Secondary | ICD-10-CM | POA: Insufficient documentation

## 2018-01-08 DIAGNOSIS — T7421XD Adult sexual abuse, confirmed, subsequent encounter: Secondary | ICD-10-CM | POA: Insufficient documentation

## 2018-01-08 DIAGNOSIS — I10 Essential (primary) hypertension: Secondary | ICD-10-CM | POA: Insufficient documentation

## 2018-01-08 DIAGNOSIS — S92314D Nondisplaced fracture of first metatarsal bone, right foot, subsequent encounter for fracture with routine healing: Secondary | ICD-10-CM | POA: Diagnosis not present

## 2018-01-08 DIAGNOSIS — T7621XD Adult sexual abuse, suspected, subsequent encounter: Secondary | ICD-10-CM | POA: Diagnosis present

## 2018-01-08 DIAGNOSIS — X58XXXD Exposure to other specified factors, subsequent encounter: Secondary | ICD-10-CM | POA: Insufficient documentation

## 2018-01-08 DIAGNOSIS — F17200 Nicotine dependence, unspecified, uncomplicated: Secondary | ICD-10-CM | POA: Insufficient documentation

## 2018-01-08 DIAGNOSIS — F329 Major depressive disorder, single episode, unspecified: Secondary | ICD-10-CM | POA: Diagnosis not present

## 2018-01-08 DIAGNOSIS — S99921D Unspecified injury of right foot, subsequent encounter: Secondary | ICD-10-CM | POA: Diagnosis present

## 2018-01-08 LAB — HIV ANTIBODY (ROUTINE TESTING W REFLEX): HIV Screen 4th Generation wRfx: NONREACTIVE

## 2018-01-08 LAB — RPR: RPR: NONREACTIVE

## 2018-01-08 MED ORDER — ACETAMINOPHEN 500 MG PO TABS
1000.0000 mg | ORAL_TABLET | Freq: Once | ORAL | Status: AC
  Administered 2018-01-08: 1000 mg via ORAL
  Filled 2018-01-08: qty 2
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
## 2018-01-08 NOTE — ED Provider Notes (Addendum)
Patient received in sign-out from Dr. York CeriseForbach.  Workup and evaluation pending SANE nurse evaluation and placement of splint.  Patient placed in short leg splint without any complication.  Patient was evaluated by SANE nurse and refused evaluation again.  She remains Hemodynamically stable and appropriate for follow-up as an outpatient.   Marland Kitchen.Splint Application Date/Time: 01/08/2018 6:40 PM Performed by: Willy Eddyobinson, Pratt Bress, MD Authorized by: Willy Eddyobinson, Kagan Mutchler, MD   Consent:    Consent obtained:  Verbal   Consent given by:  Patient Pre-procedure details:    Sensation:  Normal Procedure details:    Laterality:  Right   Location:  Foot   Foot:  R foot   Splint type:  Short leg   Supplies:  Elastic bandage, Ortho-Glass and cotton padding Post-procedure details:    Pain:  Unchanged   Sensation:  Normal   Patient tolerance of procedure:  Tolerated well, no immediate complications      ----------------------------------------- 6:52 PM on 01/08/2018 -----------------------------------------  Have examination performed.  Spoke directly with the patient informing her that will be in our weight.  If she changes her mind again she will be discharged and that she will have to return to another current facility at another date.   Willy Eddyobinson, Isabeau Mccalla, MD 01/08/18 2312
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
## 2018-01-09 NOTE — SANE Note (Signed)
Follow-up Phone Call  Patient gives verbal consent for a FNE/SANE follow-up phone call in 48-72 hours: no Patient's telephone number: n/a Patient gives verbal consent to leave voicemail at the phone number listed above: no DO NOT CALL between the hours of: n/a   
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
## 2018-02-15 NOTE — SANE Note (Addendum)
On 02/15/2018, message received from this patient stating that she needed her medication.              She stated she called everyone and really needs her meds.   At 1630, I returned her call. I informed her of the number she dialed was to the FNE program.   She apologized stating that she needed her sleep med, Ambien, and her depression med, Zoloft.   She stated that she had called behavioral health and [facility], but they informed her that she would need to be seen before scripts were filled.   I informed that if she really needed her medications, she could come to emergency department.   Patient verbalized understanding and apologized for calling the wrong number.

## 2018-02-27 ENCOUNTER — Emergency Department: Payer: Medicaid Other

## 2018-02-27 ENCOUNTER — Other Ambulatory Visit: Payer: Self-pay

## 2018-02-27 ENCOUNTER — Emergency Department
Admission: EM | Admit: 2018-02-27 | Discharge: 2018-02-27 | Disposition: A | Discharge status: Home / Self Care | Payer: Medicaid Other | Attending: Emergency Medicine | Admitting: Emergency Medicine

## 2018-02-27 DIAGNOSIS — O10013 Pre-existing essential hypertension complicating pregnancy, third trimester: Secondary | ICD-10-CM | POA: Insufficient documentation

## 2018-02-27 DIAGNOSIS — J189 Pneumonia, unspecified organism: Secondary | ICD-10-CM | POA: Diagnosis not present

## 2018-02-27 DIAGNOSIS — F17210 Nicotine dependence, cigarettes, uncomplicated: Secondary | ICD-10-CM | POA: Diagnosis not present

## 2018-02-27 DIAGNOSIS — Z79899 Other long term (current) drug therapy: Secondary | ICD-10-CM | POA: Diagnosis not present

## 2018-02-27 DIAGNOSIS — O99513 Diseases of the respiratory system complicating pregnancy, third trimester: Secondary | ICD-10-CM | POA: Insufficient documentation

## 2018-02-27 DIAGNOSIS — O99333 Smoking (tobacco) complicating pregnancy, third trimester: Secondary | ICD-10-CM | POA: Diagnosis not present

## 2018-02-27 DIAGNOSIS — J181 Lobar pneumonia, unspecified organism: Secondary | ICD-10-CM

## 2018-02-27 DIAGNOSIS — Z3A34 34 weeks gestation of pregnancy: Secondary | ICD-10-CM | POA: Insufficient documentation

## 2018-02-27 DIAGNOSIS — O26812 Pregnancy related exhaustion and fatigue, second trimester: Secondary | ICD-10-CM | POA: Diagnosis present

## 2018-02-27 LAB — BASIC METABOLIC PANEL
Anion gap: 7 (ref 5–15)
BUN: 6 mg/dL (ref 6–20)
CO2: 21 mmol/L — ABNORMAL LOW (ref 22–32)
Calcium: 8.3 mg/dL — ABNORMAL LOW (ref 8.9–10.3)
Chloride: 109 mmol/L (ref 101–111)
Creatinine, Ser: 0.48 mg/dL (ref 0.44–1.00)
GFR calc Af Amer: >60 mL/min (ref >60)
GLUCOSE: 110 mg/dL — AB (ref 65–99)
POTASSIUM: 3.6 mmol/L (ref 3.5–5.1)
Sodium: 137 mmol/L (ref 135–145)

## 2018-02-27 LAB — CBC
HEMATOCRIT: 31.7 % — AB (ref 35.0–47.0)
Hemoglobin: 10.8 g/dL — ABNORMAL LOW (ref 12.0–16.0)
MCH: 29.7 pg (ref 26.0–34.0)
MCHC: 34 g/dL (ref 32.0–36.0)
MCV: 87.5 fL (ref 80.0–100.0)
Platelets: 189 10*3/uL (ref 150–440)
RBC: 3.62 MIL/uL — ABNORMAL LOW (ref 3.80–5.20)
RDW: 14.2 % (ref 11.5–14.5)
WBC: 7.8 10*3/uL (ref 3.6–11.0)

## 2018-02-27 LAB — HCG, QUANTITATIVE, PREGNANCY: HCG, BETA CHAIN, QUANT, S: 5816 m[IU]/mL — AB (ref <5)

## 2018-02-27 LAB — TROPONIN I: Troponin I: <0.03 ng/mL (ref <0.03)

## 2018-02-27 MED ORDER — AZITHROMYCIN 500 MG PO TABS
500.0000 mg | ORAL_TABLET | Freq: Once | ORAL | Status: AC
  Administered 2018-02-27: 500 mg via ORAL
  Filled 2018-02-27: qty 1

## 2018-02-27 MED ORDER — ALBUTEROL SULFATE (2.5 MG/3ML) 0.083% IN NEBU
2.5000 mg | INHALATION_SOLUTION | Freq: Once | RESPIRATORY_TRACT | Status: AC
  Administered 2018-02-27: 2.5 mg via RESPIRATORY_TRACT
  Filled 2018-02-27: qty 3

## 2018-02-27 MED ORDER — ALBUTEROL SULFATE HFA 108 (90 BASE) MCG/ACT IN AERS: 2.0000 | INHALATION_SPRAY | Freq: Four times a day (QID) | RESPIRATORY_TRACT | 0 refills | Status: DC | PRN

## 2018-02-27 MED ORDER — AZITHROMYCIN 250 MG PO TABS: 250.0000 mg | ORAL_TABLET | Freq: Every day | ORAL | 0 refills | Status: DC
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
## 2018-02-27 NOTE — ED Triage Notes (Signed)
Pt c/o wheezing, SOB, cough, congestion, body aches, HA x few days. Pt is [redacted] weeks pregnant, [facility]. Denies any pregnancy c/o or vaginal bleeding or abd cramping. tachypenic in triage. Denies N&V&D.

## 2018-02-27 NOTE — ED Notes (Signed)
POC pregnancy test positive.  Pt is due 04/30/2018.  She states that she is [redacted] weeks pregnant

## 2018-02-27 NOTE — ED Notes (Signed)
FN: pt presents with cold sx for three days. Pt is 34 weeks preg and denies any problems with her pregnancy, no bleeding and or cramping.

## 2018-02-27 NOTE — ED Notes (Signed)
Pt reports that baby is moving as usual

## 2018-02-27 NOTE — Discharge Instructions (Signed)
He is taking medications as prescribed.  Please call your OB/GYN tomorrow morning to inform them of today's ER visit and to arrange a follow-up appointment within the next 1-2 days for recheck/reevaluation.  Return to the emergency department for any trouble breathing, fever, chest pain.

## 2018-03-20 ENCOUNTER — Observation Stay
Admission: EM | Admit: 2018-03-20 | Discharge: 2018-03-20 | Disposition: A | Discharge status: Home / Self Care | Payer: Medicaid Other | Attending: Obstetrics and Gynecology | Admitting: Obstetrics and Gynecology

## 2018-03-20 ENCOUNTER — Other Ambulatory Visit: Payer: Self-pay

## 2018-03-20 DIAGNOSIS — O26893 Other specified pregnancy related conditions, third trimester: Secondary | ICD-10-CM | POA: Diagnosis not present

## 2018-03-20 DIAGNOSIS — R109 Unspecified abdominal pain: Secondary | ICD-10-CM | POA: Diagnosis present

## 2018-03-20 DIAGNOSIS — Z3A34 34 weeks gestation of pregnancy: Secondary | ICD-10-CM

## 2018-03-20 DIAGNOSIS — R1084 Generalized abdominal pain: Secondary | ICD-10-CM

## 2018-03-20 LAB — URINALYSIS, ROUTINE W REFLEX MICROSCOPIC
BILIRUBIN URINE: NEGATIVE
Glucose, UA: NEGATIVE mg/dL
Hgb urine dipstick: NEGATIVE
KETONES UR: NEGATIVE mg/dL
Nitrite: NEGATIVE
PH: 7 (ref 5.0–8.0)
Protein, ur: NEGATIVE mg/dL
SPECIFIC GRAVITY, URINE: 1.018 (ref 1.005–1.030)

## 2018-03-20 LAB — URINE DRUG SCREEN, QUALITATIVE (ARMC ONLY)
AMPHETAMINES, UR SCREEN: NOT DETECTED
BARBITURATES, UR SCREEN: NOT DETECTED
BENZODIAZEPINE, UR SCRN: NOT DETECTED
COCAINE METABOLITE, UR ~~LOC~~: POSITIVE — AB
Cannabinoid 50 Ng, Ur ~~LOC~~: POSITIVE — AB
MDMA (Ecstasy)Ur Screen: NOT DETECTED
Methadone Scn, Ur: NOT DETECTED
Opiate, Ur Screen: NOT DETECTED
Phencyclidine (PCP) Ur S: NOT DETECTED
TRICYCLIC, UR SCREEN: NOT DETECTED

## 2018-03-20 NOTE — OB Triage Note (Signed)
Patient arrived on unit from ED with complaints of lower abdominal sharp  pain and pressure that began last night about 8:00 pm.  No other complaints.

## 2018-03-20 NOTE — Discharge Instructions (Signed)
Keep your next schedule [facility]. Or call before if you have any questions or concerns.

## 2018-03-20 NOTE — Discharge Summary (Signed)
Patient discharged home, discharge instructions given, patient states understanding. Patient left floor in stable condition, denies any other needs at this time. Patient to keep next scheduled OB appointment. Patient educated on importance of not using cocaine or MJ,  Also  Encouraged patient to take medications as prescribed and keep OB appointments

## 2018-03-21 NOTE — Discharge Summary (Signed)
    L&D OB Triage Note  SUBJECTIVE [name] is a 32 y.o. [id number] female at 5441w2d, EDD Estimated Date of Delivery: 04/30/18 who presented to triage with complaints of abd pain.   OB History  Gravida Para Term Preterm AB Living  6 4 3 1 1 3   SAB TAB Ectopic Multiple Live Births  1 0 0 0 4    # Outcome Date GA Lbr Len/2nd Weight Sex Delivery Anes PTL Lv  6 Current           5 SAB 05/25/16        FD  4 Term 07/28/15 4458w2d    CS-LTranv   LIV  3 Term 05/25/12 8224w5d    CS-LTranv   LIV  2 Term 05/25/01 1525w4d    CS-LTranv   LIV  1 Preterm 05/26/99 4511w6d    CS-LTranv   DEC    Obstetric Comments  Had a 33 year old that passed away    No medications prior to admission.     OBJECTIVE  Nursing Evaluation:   BP 129/66   Pulse 74    Findings:   Not in labor.     POS tox screen for cocaine     U/S no UTI despite poor CC urine       NST was performed and has been reviewed by me.  NST INTERPRETATION: Category I  Mode: External Baseline Rate (A): 135 bpm Variability: Moderate Accelerations: 15 x 15 Decelerations: None     Contraction Frequency (min): none  ASSESSMENT Impression:  1.  Pregnancy:  [id number] at 7441w2d , EDD Estimated Date of Delivery: 04/30/18 2.  NST:  Category I  PLAN 1. Reassurance given 2. Discharge home with standard labor precautions given to return to L&D or call the office for problems. 3. Continue routine prenatal care as schedule at other provider.

## 2018-03-23 ENCOUNTER — Emergency Department: Payer: Medicaid Other

## 2018-03-23 ENCOUNTER — Other Ambulatory Visit: Payer: Self-pay

## 2018-03-23 ENCOUNTER — Encounter: Payer: Self-pay | Admitting: Emergency Medicine

## 2018-03-23 ENCOUNTER — Emergency Department
Admission: EM | Admit: 2018-03-23 | Discharge: 2018-03-23 | Disposition: A | Discharge status: Home / Self Care | Payer: Medicaid Other | Attending: Emergency Medicine | Admitting: Emergency Medicine

## 2018-03-23 DIAGNOSIS — O99340 Other mental disorders complicating pregnancy, unspecified trimester: Secondary | ICD-10-CM | POA: Diagnosis not present

## 2018-03-23 DIAGNOSIS — F1420 Cocaine dependence, uncomplicated: Secondary | ICD-10-CM | POA: Diagnosis not present

## 2018-03-23 DIAGNOSIS — F1220 Cannabis dependence, uncomplicated: Secondary | ICD-10-CM | POA: Insufficient documentation

## 2018-03-23 DIAGNOSIS — F329 Major depressive disorder, single episode, unspecified: Secondary | ICD-10-CM | POA: Diagnosis not present

## 2018-03-23 DIAGNOSIS — Z91010 Allergy to peanuts: Secondary | ICD-10-CM | POA: Insufficient documentation

## 2018-03-23 DIAGNOSIS — O99519 Diseases of the respiratory system complicating pregnancy, unspecified trimester: Secondary | ICD-10-CM | POA: Insufficient documentation

## 2018-03-23 DIAGNOSIS — O10019 Pre-existing essential hypertension complicating pregnancy, unspecified trimester: Secondary | ICD-10-CM | POA: Insufficient documentation

## 2018-03-23 DIAGNOSIS — O9952 Diseases of the respiratory system complicating childbirth: Secondary | ICD-10-CM | POA: Diagnosis present

## 2018-03-23 DIAGNOSIS — F17200 Nicotine dependence, unspecified, uncomplicated: Secondary | ICD-10-CM | POA: Diagnosis not present

## 2018-03-23 DIAGNOSIS — O99320 Drug use complicating pregnancy, unspecified trimester: Secondary | ICD-10-CM | POA: Diagnosis not present

## 2018-03-23 DIAGNOSIS — Z3A00 Weeks of gestation of pregnancy not specified: Secondary | ICD-10-CM | POA: Insufficient documentation

## 2018-03-23 DIAGNOSIS — O99330 Smoking (tobacco) complicating pregnancy, unspecified trimester: Secondary | ICD-10-CM | POA: Diagnosis not present

## 2018-03-23 DIAGNOSIS — J189 Pneumonia, unspecified organism: Secondary | ICD-10-CM | POA: Insufficient documentation

## 2018-03-23 DIAGNOSIS — Z79899 Other long term (current) drug therapy: Secondary | ICD-10-CM | POA: Insufficient documentation

## 2018-03-23 MED ORDER — DEXTROMETHORPHAN POLISTIREX ER 30 MG/5ML PO SUER
15.0000 mg | Freq: Once | ORAL | Status: AC
  Administered 2018-03-23: 15 mg via ORAL
  Filled 2018-03-23: qty 5

## 2018-03-23 MED ORDER — CEFTRIAXONE SODIUM 1 G IJ SOLR
1.0000 g | Freq: Once | INTRAMUSCULAR | Status: AC
  Administered 2018-03-23: 1 g via INTRAMUSCULAR
  Filled 2018-03-23: qty 10

## 2018-03-23 MED ORDER — LIDOCAINE HCL (PF) 1 % IJ SOLN
INTRAMUSCULAR | Status: AC
  Filled 2018-03-23: qty 5

## 2018-03-23 MED ORDER — AZITHROMYCIN 250 MG PO TABS: ORAL_TABLET | ORAL | 0 refills | Status: AC

## 2018-03-23 NOTE — ED Provider Notes (Signed)
[facility] Emergency Department Provider Note  ____________________________________________  Time seen: Approximately 10:24 PM  I have reviewed the triage vital signs and the nursing notes.   HISTORY  Chief Complaint Cough    HPI [name] is a 32 y.o. female with a history of community-acquired pneumonia and pregnancy, presents to the emergency department with cough, shortness of breath and chest tightness for the past 3 days.  Patient reports that she was previously diagnosed with pneumonia approximately 3 weeks ago.  Patient reports that her symptoms improved but acutely worsened.  Patient denies recent travel, prolonged immobilization, recent surgery and history of PE.  She denies fever and chills.  Patient denies abdominal pain, pelvic pain or gush of vaginal fluids or vaginal bleeding.  Patient has detected good fetal movement.  Past Medical History:  Diagnosis Date  . Depression   . History of substance abuse   . History of suicide attempt   . History of thyroid disease   . Hypertension   . Thyroid disease     Patient Active Problem List   Diagnosis Date Noted  . Abdominal pain 03/20/2018  . Cocaine use disorder, moderate, dependence (HCC) 01/01/2018  . Cannabis use disorder, moderate, dependence (HCC) 01/01/2018  . Pregnant 12/31/2017  . Medically noncompliant 12/31/2017  . Tobacco use disorder 01/12/2016  . Stimulant use disorder (HCC) (cocaine) 01/12/2016  . Alcohol use disorder, moderate, dependence (HCC) 01/12/2016  . Self-inflicted laceration of wrist 01/11/2016  . Major depressive disorder, recurrent severe without psychotic features (HCC) 01/11/2016    Past Surgical History:  Procedure Laterality Date  . CESAREAN SECTION  2000, 2002, 2013    Prior to Admission medications   Medication Sig Start Date End Date Taking? Authorizing Provider  albuterol (PROVENTIL HFA;VENTOLIN HFA) 108 (90 Base) MCG/ACT inhaler Inhale 2 puffs  into the lungs every 6 (six) hours as needed for wheezing or shortness of breath. Patient not taking: Reported on 03/20/2018 02/27/18   Minna AntisPaduchowski, Kevin, MD  azithromycin (ZITHROMAX Z-PAK) 250 MG tablet Take 2 tablets (500 mg) on  Day 1,  followed by 1 tablet (250 mg) once daily on Days 2 through 5. 03/23/18 03/28/18  Orvil FeilWoods, Matthan Sledge M, PA-C  folic acid (FOLVITE) 1 MG tablet Take 1 tablet (1 mg total) by mouth daily. 01/05/18   Pucilowska, Braulio ConteJolanta B, MD  hydroxyprogesterone caproate (MAKENA) 250 mg/mL OIL injection Inject 1 mL (250 mg total) into the muscle once a week. On Tuesdays 01/09/18   Pucilowska, Braulio ConteJolanta B, MD  pantoprazole (PROTONIX) 20 MG tablet Take 1 tablet (20 mg total) by mouth daily. 05/17/16 05/17/17  Jennye MoccasinQuigley, Brian S, MD  Prenatal Vit-Fe Fumarate-FA (PRENATAL MULTIVITAMIN) TABS tablet Take 1 tablet by mouth daily at 12 noon. 01/04/18   Pucilowska, Braulio ConteJolanta B, MD  QUEtiapine (SEROQUEL) 50 MG tablet Take 1 tablet (50 mg total) by mouth at bedtime. Patient not taking: Reported on 03/20/2018 01/04/18   Pucilowska, Ellin GoodieJolanta B, MD  senna (SENOKOT) 8.6 MG TABS tablet Take 1 tablet (8.6 mg total) by mouth 2 (two) times daily at 10 AM and 5 PM. Patient not taking: Reported on 03/20/2018 01/04/18   Pucilowska, Braulio ConteJolanta B, MD  sertraline (ZOLOFT) 50 MG tablet Take 1 tablet (50 mg total) by mouth at bedtime. Patient not taking: Reported on 03/20/2018 01/04/18   Shari ProwsPucilowska, Jolanta B, MD    Allergies Asa [aspirin]; Banana; Peanut-containing drug products; and Pecan nut (diagnostic)  Family History  Problem Relation Age of Onset  . Diabetes Mother  Social History Social History   Tobacco Use  . Smoking status: Current Every Day Smoker    Packs/day: 0.50  . Smokeless tobacco: Never Used  Substance Use Topics  . Alcohol use: Not Currently    Comment: social drinker  . Drug use: Not Currently    Types: Cocaine, Marijuana    Comment: Positive UDS 12/2017     Review of Systems  Constitutional: No  fever/chills Eyes: No visual changes. No discharge ENT: No upper respiratory complaints. Cardiovascular: no chest pain. Respiratory: Patient has cough, shortness of breath and chest tightness. Gastrointestinal: No abdominal pain.  No nausea, no vomiting.  No diarrhea.  No constipation. Musculoskeletal: Negative for musculoskeletal pain. Skin: Negative for rash, abrasions, lacerations, ecchymosis. Neurological: Negative for headaches, focal weakness or numbness.   ____________________________________________   PHYSICAL EXAM:  VITAL SIGNS: ED Triage Vitals  Enc Vitals Group     BP 03/23/18 1627 (!) 144/71     Pulse Rate 03/23/18 1627 94     Resp 03/23/18 1627 18     Temp 03/23/18 1627 98 F (36.7 C)     Temp Source 03/23/18 1627 Oral     SpO2 03/23/18 1627 98 %     Weight 03/23/18 1614 170 lb (77.1 kg)     Height 03/23/18 1614 4\' 11"  (1.499 m)     Head Circumference --      Peak Flow --      Pain Score 03/23/18 1614 0     Pain Loc --      Pain Edu? --      Excl. in GC? --      Constitutional: Alert and oriented. Well appearing and in no acute distress. Eyes: Conjunctivae are normal. PERRL. EOMI. Head: Atraumatic. ENT:      Ears: TMs are pearly.      Nose: No congestion/rhinnorhea.      Mouth/Throat: Mucous membranes are moist.  Neck: No stridor.  No cervical spine tenderness to palpation. Cardiovascular: Normal rate, regular rhythm. Normal S1 and S2.  Good peripheral circulation. Respiratory: Normal respiratory effort without tachypnea or retractions. Lungs CTAB. Good air entry to the bases with no decreased or absent breath sounds. Gastrointestinal: Bowel sounds 4 quadrants. Soft and nontender to palpation. No guarding or rigidity. No palpable masses. No distention. No CVA tenderness. Musculoskeletal: Full range of motion to all extremities. No gross deformities appreciated. Neurologic:  Normal speech and language. No gross focal neurologic deficits are appreciated.   Skin:  Skin is warm, dry and intact. No rash noted. Psychiatric: Mood and affect are normal. Speech and behavior are normal. Patient exhibits appropriate insight and judgement.   ____________________________________________   LABS (all labs ordered are listed, but only abnormal results are displayed)  Labs Reviewed - No data to display ____________________________________________  EKG   ____________________________________________  RADIOLOGY Geraldo Pitter, personally viewed and evaluated these images (plain radiographs) as part of my medical decision making, as well as reviewing the written report by the radiologist.  Dg Chest 2 View  Result Date: 03/23/2018 CLINICAL DATA:  Cough and congestion for 3 days, pregnant patient EXAM: CHEST - 2 VIEW COMPARISON:  02/27/2018 chest radiograph. FINDINGS: Stable cardiomediastinal silhouette with normal heart size. No pneumothorax. No pleural effusion. Patchy lingular opacity. No pulmonary edema. IMPRESSION: Patchy lingular opacity, most compatible with lingular pneumonia. Postpartum follow-up PA and lateral chest radiographs may be obtained to document resolution. Electronically Signed   By: Delbert Phenix M.D.   On: 03/23/2018 18:26  ____________________________________________    PROCEDURES  Procedure(s) performed:    Procedures    Medications  lidocaine (PF) (XYLOCAINE) 1 % injection (has no administration in time range)  dextromethorphan (DELSYM) 30 MG/5ML liquid 15 mg (15 mg Oral Given 03/23/18 1857)  cefTRIAXone (ROCEPHIN) injection 1 g (1 g Intramuscular Given 03/23/18 1919)     ____________________________________________   INITIAL IMPRESSION / ASSESSMENT AND PLAN / ED COURSE  Pertinent labs & imaging results that were available during my care of the patient were reviewed by me and considered in my medical decision making (see chart for details).  Review of the [place] CSRS was performed in accordance of the [facility] prior  to dispensing any controlled drugs.     Assessment and plan Community-acquired pneumonia Patient presents to the emergency department with worsening cough, shortness of breath and chest tightness for the past 3 days.  Patient has prior history of community-acquired pneumonia.  On chest x-ray, a lingular opacity was identified which varied from prior consolidation identified on patient's last chest x-ray.  Patient was given Rocephin in the emergency department and discharged with azithromycin.  Levaquin was  not considered due to pregnancy.  Patient was advised to follow-up with her OB/GYN this week.  Patient once again denied abdominal pain, vaginal bleeding or gush of vaginal fluids prior to discharge.  Vital signs are reassuring prior to discharge.  All patient questions were answered.    ____________________________________________  FINAL CLINICAL IMPRESSION(S) / ED DIAGNOSES  Final diagnoses:  Community acquired pneumonia of left lung, unspecified part of lung      NEW MEDICATIONS STARTED DURING THIS VISIT:  ED Discharge Orders        Ordered    azithromycin (ZITHROMAX Z-PAK) 250 MG tablet     03/23/18 1911          This chart was dictated using voice recognition software/Dragon. Despite best efforts to proofread, errors can occur which can change the meaning. Any change was purely unintentional.    Orvil Feil, PA-C 03/23/18 2247    Minna Antis, MD 03/23/18 [phone]

## 2018-03-23 NOTE — ED Notes (Signed)
Pt c/o ShOB. Jackie aware, will assess pt

## 2018-03-23 NOTE — ED Triage Notes (Signed)
Cough x 3 days.    AAOx3.  Skin warm and dry. NAD

## 2018-03-26 IMAGING — DX DG HAND COMPLETE 3+V*R*
3 series · 3 of 3 positions shown · non-contrast
Comparison: None.

CLINICAL DATA: Right hand pain.

EXAM:
RIGHT HAND - COMPLETE 3+ VIEW

[hand ap]
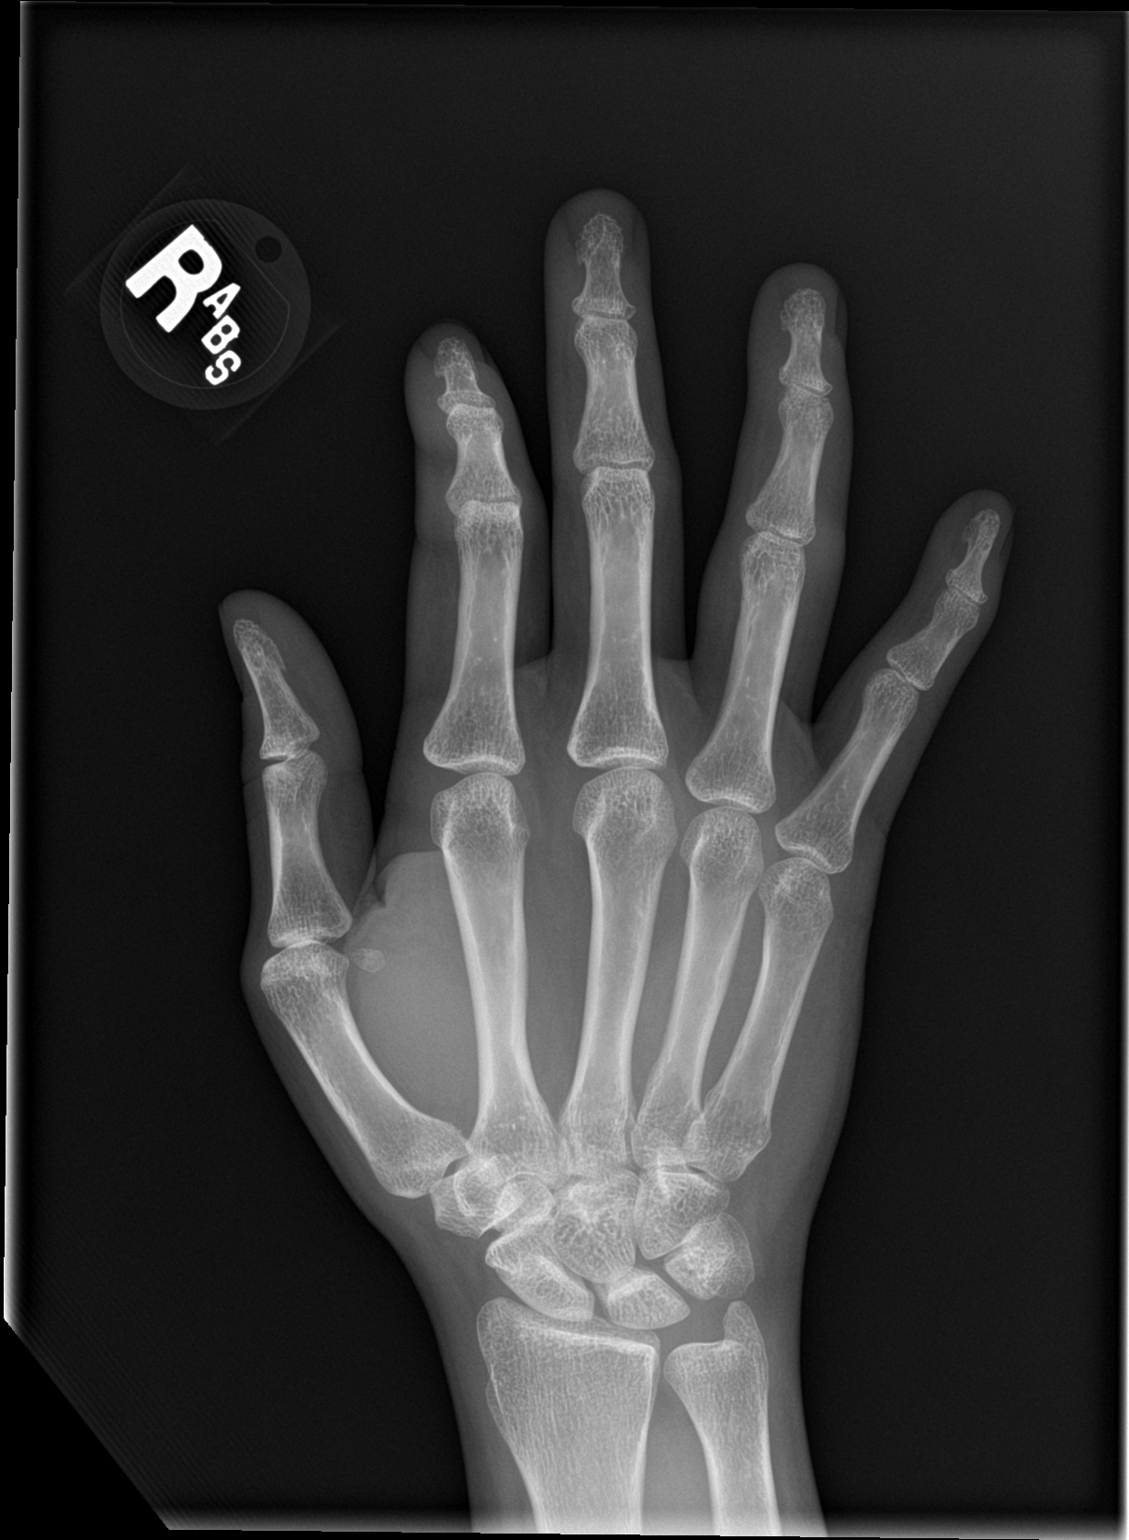

[hand obl]
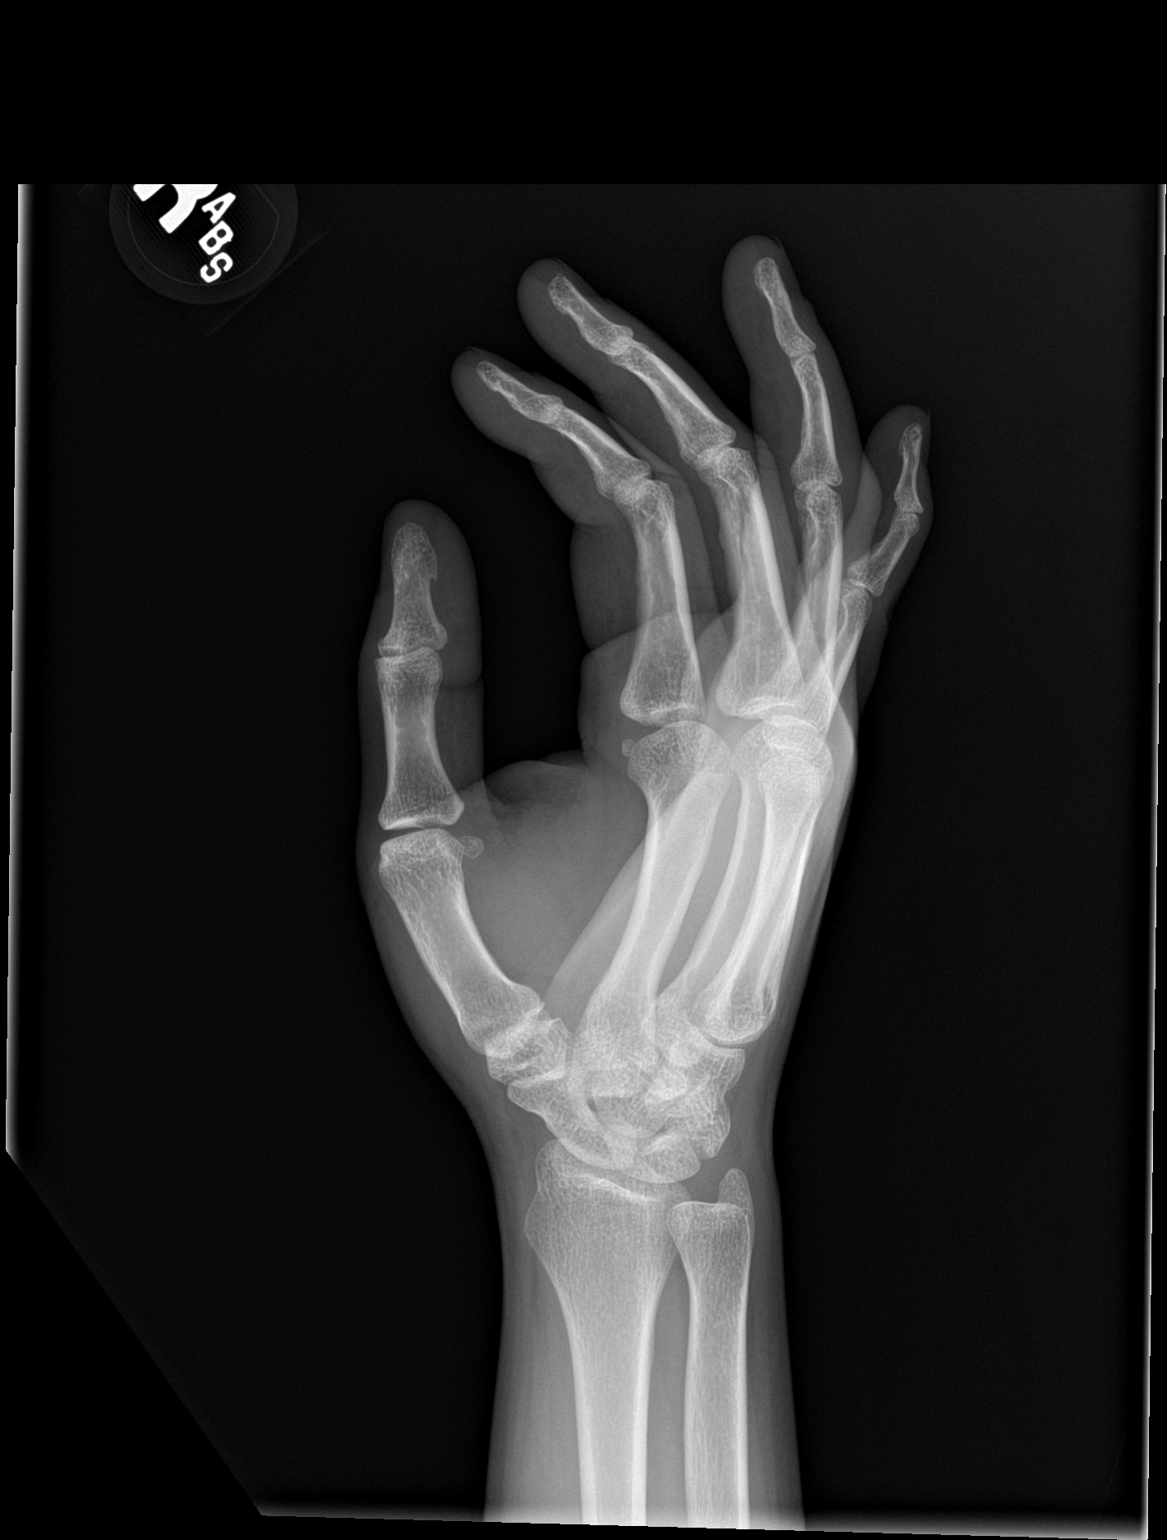

[hand lat]
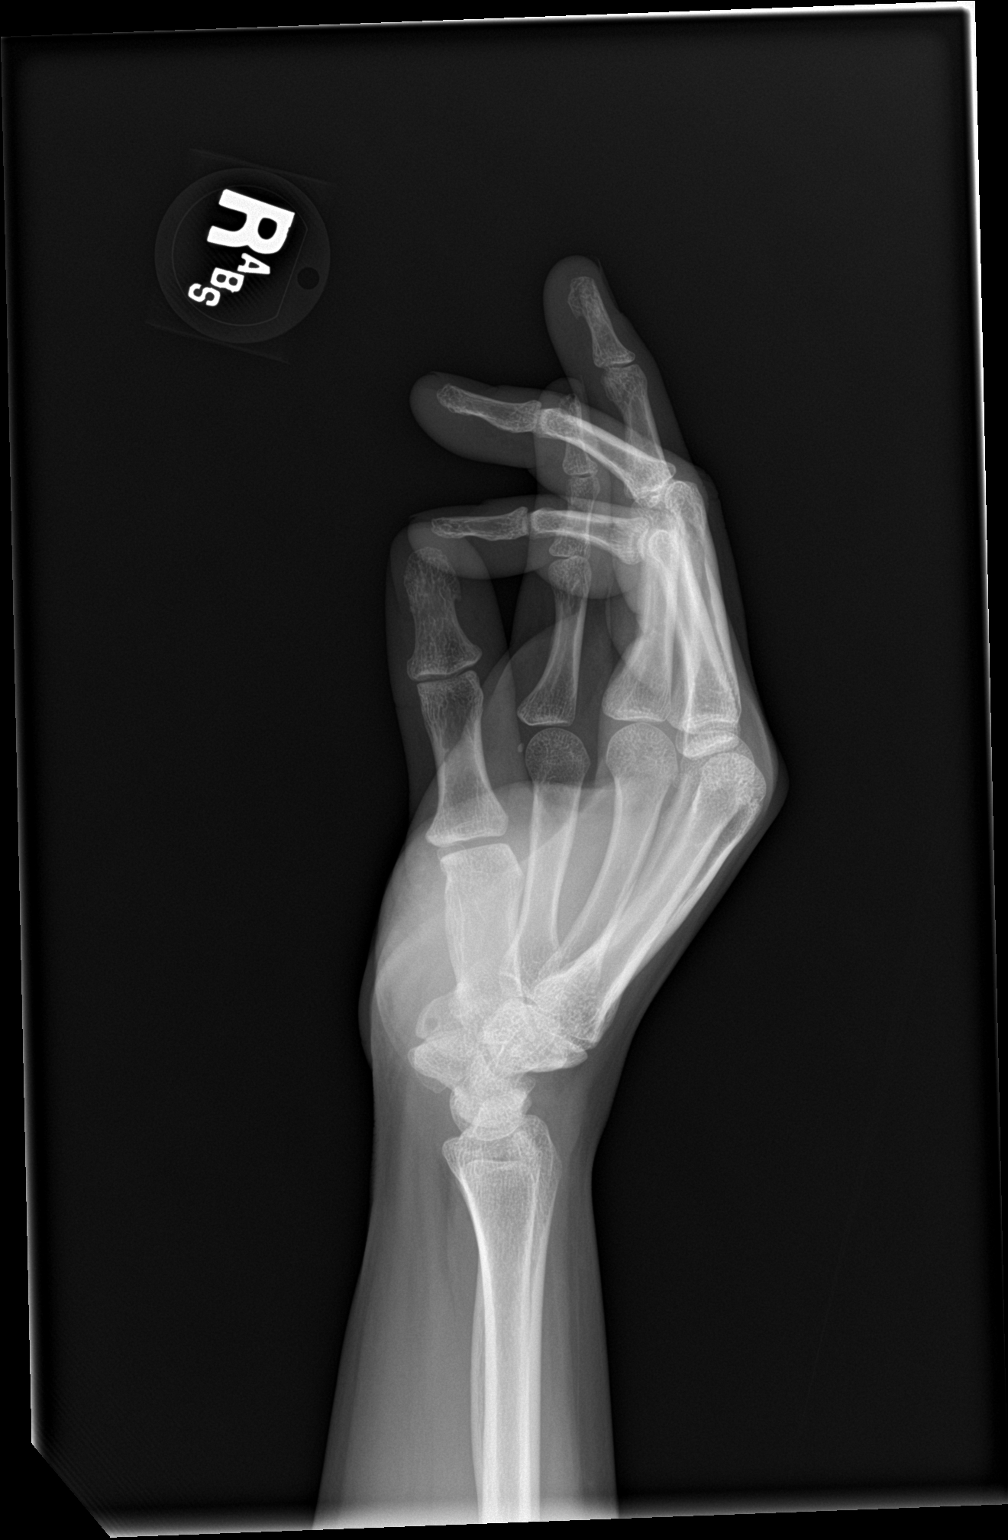

[3 of 3 positions shown; findings below may reference images not displayed]

FINDINGS: There is no evidence of fracture or dislocation. There is no
evidence of arthropathy or other focal bone abnormality. Mild soft
tissue swelling of the fourth digit.
IMPRESSION: No acute fracture or dislocation identified about the right Hand.

## 2018-07-23 ENCOUNTER — Encounter (HOSPITAL_COMMUNITY): Payer: Self-pay

## 2018-10-14 ENCOUNTER — Emergency Department: Payer: Medicaid Other

## 2018-10-14 ENCOUNTER — Emergency Department
Admission: EM | Admit: 2018-10-14 | Discharge: 2018-10-14 | Disposition: A | Discharge status: Home / Self Care | Payer: Medicaid Other | Attending: Emergency Medicine | Admitting: Emergency Medicine

## 2018-10-14 ENCOUNTER — Encounter: Payer: Self-pay | Admitting: Emergency Medicine

## 2018-10-14 ENCOUNTER — Other Ambulatory Visit: Payer: Self-pay

## 2018-10-14 DIAGNOSIS — B9689 Other specified bacterial agents as the cause of diseases classified elsewhere: Secondary | ICD-10-CM

## 2018-10-14 DIAGNOSIS — N739 Female pelvic inflammatory disease, unspecified: Secondary | ICD-10-CM

## 2018-10-14 DIAGNOSIS — N760 Acute vaginitis: Secondary | ICD-10-CM | POA: Insufficient documentation

## 2018-10-14 DIAGNOSIS — R1012 Left upper quadrant pain: Secondary | ICD-10-CM | POA: Diagnosis present

## 2018-10-14 LAB — COMPREHENSIVE METABOLIC PANEL
ALBUMIN: 4.3 g/dL (ref 3.5–5.0)
ALK PHOS: 70 U/L (ref 38–126)
ALT: 8 U/L (ref 0–44)
AST: 10 U/L — ABNORMAL LOW (ref 15–41)
Anion gap: 9 (ref 5–15)
BILIRUBIN TOTAL: 0.8 mg/dL (ref 0.3–1.2)
BUN: 12 mg/dL (ref 6–20)
CHLORIDE: 108 mmol/L (ref 98–111)
CO2: 25 mmol/L (ref 22–32)
Calcium: 9 mg/dL (ref 8.9–10.3)
Creatinine, Ser: 1.01 mg/dL — ABNORMAL HIGH (ref 0.44–1.00)
GFR calc Af Amer: >60 mL/min (ref >60)
GFR calc non Af Amer: >60 mL/min (ref >60)
Glucose, Bld: 95 mg/dL (ref 70–99)
POTASSIUM: 4.5 mmol/L (ref 3.5–5.1)
Sodium: 142 mmol/L (ref 135–145)
Total Protein: 7.9 g/dL (ref 6.5–8.1)

## 2018-10-14 LAB — WET PREP, GENITAL
SPERM: NONE SEEN
TRICH WET PREP: NONE SEEN
Yeast Wet Prep HPF POC: NONE SEEN

## 2018-10-14 LAB — CBC
HEMATOCRIT: 40.9 % (ref 36.0–46.0)
HEMOGLOBIN: 13.1 g/dL (ref 12.0–15.0)
MCH: 28.7 pg (ref 26.0–34.0)
MCHC: 32 g/dL (ref 30.0–36.0)
MCV: 89.7 fL (ref 80.0–100.0)
NRBC: 0 % (ref 0.0–0.2)
Platelets: 204 10*3/uL (ref 150–400)
RBC: 4.56 MIL/uL (ref 3.87–5.11)
RDW: 14.1 % (ref 11.5–15.5)
WBC: 9.7 10*3/uL (ref 4.0–10.5)

## 2018-10-14 LAB — URINALYSIS, COMPLETE (UACMP) WITH MICROSCOPIC
BACTERIA UA: NONE SEEN
Bilirubin Urine: NEGATIVE
GLUCOSE, UA: NEGATIVE mg/dL
HGB URINE DIPSTICK: NEGATIVE
Ketones, ur: NEGATIVE mg/dL
LEUKOCYTES UA: NEGATIVE
NITRITE: NEGATIVE
PROTEIN: NEGATIVE mg/dL
Specific Gravity, Urine: 1.013 (ref 1.005–1.030)
pH: 7 (ref 5.0–8.0)

## 2018-10-14 LAB — POCT PREGNANCY, URINE: Preg Test, Ur: NEGATIVE

## 2018-10-14 LAB — LIPASE, BLOOD: Lipase: 24 U/L (ref 11–51)

## 2018-10-14 LAB — CHLAMYDIA/NGC RT PCR (ARMC ONLY)
Chlamydia Tr: NOT DETECTED
N gonorrhoeae: NOT DETECTED

## 2018-10-14 MED ORDER — ONDANSETRON HCL 4 MG/2ML IJ SOLN
4.0000 mg | Freq: Once | INTRAMUSCULAR | Status: AC
  Administered 2018-10-14: 4 mg via INTRAVENOUS
  Filled 2018-10-14: qty 2

## 2018-10-14 MED ORDER — OXYCODONE-ACETAMINOPHEN 5-325 MG PO TABS
1.0000 | ORAL_TABLET | Freq: Once | ORAL | Status: AC
  Administered 2018-10-14: 1 via ORAL
  Filled 2018-10-14: qty 1

## 2018-10-14 MED ORDER — METRONIDAZOLE 500 MG PO TABS: 500.0000 mg | ORAL_TABLET | Freq: Two times a day (BID) | ORAL | 0 refills | Status: AC

## 2018-10-14 MED ORDER — DOXYCYCLINE HYCLATE 100 MG PO TABS
100.0000 mg | ORAL_TABLET | Freq: Once | ORAL | Status: AC
  Administered 2018-10-14: 100 mg via ORAL
  Filled 2018-10-14: qty 1

## 2018-10-14 MED ORDER — SODIUM CHLORIDE 0.9 % IV BOLUS
1000.0000 mL | Freq: Once | INTRAVENOUS | Status: AC
  Administered 2018-10-14: 1000 mL via INTRAVENOUS

## 2018-10-14 MED ORDER — METRONIDAZOLE 500 MG PO TABS
500.0000 mg | ORAL_TABLET | Freq: Once | ORAL | Status: AC
  Administered 2018-10-14: 500 mg via ORAL
  Filled 2018-10-14: qty 1

## 2018-10-14 MED ORDER — MORPHINE SULFATE (PF) 4 MG/ML IV SOLN
4.0000 mg | Freq: Once | INTRAVENOUS | Status: AC
  Administered 2018-10-14: 4 mg via INTRAVENOUS
  Filled 2018-10-14: qty 1

## 2018-10-14 MED ORDER — DOXYCYCLINE HYCLATE 100 MG PO CAPS: 100.0000 mg | ORAL_CAPSULE | Freq: Two times a day (BID) | ORAL | 0 refills | Status: DC

## 2018-10-14 MED ORDER — CEFTRIAXONE SODIUM 250 MG IJ SOLR
250.0000 mg | Freq: Once | INTRAMUSCULAR | Status: AC
  Administered 2018-10-14: 250 mg via INTRAMUSCULAR
  Filled 2018-10-14: qty 250

## 2018-10-14 NOTE — ED Notes (Signed)
Pt ambulatory to POV without difficulty. VSS. NAD. Discharge instructions, RX, and follow up reviewed. All questions and concerns addressed.

## 2018-10-14 NOTE — ED Provider Notes (Addendum)
[facility] Emergency Department Provider Note  ___________________________________________   First MD Initiated Contact with Patient 10/14/18 1403     (approximate)  I have reviewed the triage vital signs and the nursing notes.   HISTORY  Chief Complaint Abdominal Pain   HPI [name] is a 32 y.o. female with a history of depression as well as substance abuse and hypertension was presenting to the emergency department diffuse abdominal pain over the past 24 to 48 hours.  Says the pain is cramping and generalized.  Denies any burning with urination, nausea vomiting or diarrhea.  Denies any radiation through to the back.  Denies any shortness of breath or chest pain.  Says that she was just tested and was negative for STDs in the spring.  Says the pain is a 10 out of 10.  Last alcohol was yesterday when she says she drank half a pint of liquor.  Says that she only drinks intermittently.   Past Medical History:  Diagnosis Date  . Depression   . History of substance abuse (HCC)   . History of suicide attempt   . History of thyroid disease   . Hypertension   . Thyroid disease     Patient Active Problem List   Diagnosis Date Noted  . Abdominal pain 03/20/2018  . Cocaine use disorder, moderate, dependence (HCC) 01/01/2018  . Cannabis use disorder, moderate, dependence (HCC) 01/01/2018  . Pregnant 12/31/2017  . Medically noncompliant 12/31/2017  . Tobacco use disorder 01/12/2016  . Stimulant use disorder (HCC) (cocaine) 01/12/2016  . Alcohol use disorder, moderate, dependence (HCC) 01/12/2016  . Self-inflicted laceration of wrist 01/11/2016  . Major depressive disorder, recurrent severe without psychotic features (HCC) 01/11/2016    Past Surgical History:  Procedure Laterality Date  . CESAREAN SECTION  2000, 2002, 2013    Prior to Admission medications   Medication Sig Start Date End Date Taking? Authorizing Provider  albuterol (PROVENTIL  HFA;VENTOLIN HFA) 108 (90 Base) MCG/ACT inhaler Inhale 2 puffs into the lungs every 6 (six) hours as needed for wheezing or shortness of breath. Patient not taking: Reported on 03/20/2018 02/27/18   Minna Antis, MD  folic acid (FOLVITE) 1 MG tablet Take 1 tablet (1 mg total) by mouth daily. 01/05/18   Pucilowska, Braulio Conte B, MD  hydroxyprogesterone caproate (MAKENA) 250 mg/mL OIL injection Inject 1 mL (250 mg total) into the muscle once a week. On Tuesdays 01/09/18   Pucilowska, Braulio Conte B, MD  pantoprazole (PROTONIX) 20 MG tablet Take 1 tablet (20 mg total) by mouth daily. 05/17/16 05/17/17  Jennye Moccasin, MD  Prenatal Vit-Fe Fumarate-FA (PRENATAL MULTIVITAMIN) TABS tablet Take 1 tablet by mouth daily at 12 noon. 01/04/18   Pucilowska, Braulio Conte B, MD  QUEtiapine (SEROQUEL) 50 MG tablet Take 1 tablet (50 mg total) by mouth at bedtime. Patient not taking: Reported on 03/20/2018 01/04/18   Pucilowska, Ellin Goodie, MD  senna (SENOKOT) 8.6 MG TABS tablet Take 1 tablet (8.6 mg total) by mouth 2 (two) times daily at 10 AM and 5 PM. Patient not taking: Reported on 03/20/2018 01/04/18   Pucilowska, Braulio Conte B, MD  sertraline (ZOLOFT) 50 MG tablet Take 1 tablet (50 mg total) by mouth at bedtime. Patient not taking: Reported on 03/20/2018 01/04/18   Shari Prows, MD    Allergies Aspirin; Banana; Other; Peanut oil; Peanut-containing drug products; and Pecan nut (diagnostic)  Family History  Problem Relation Age of Onset  . Diabetes Mother  Social History Social History   Tobacco Use  . Smoking status: Current Every Day Smoker    Packs/day: 0.50  . Smokeless tobacco: Never Used  Substance Use Topics  . Alcohol use: Not Currently    Comment: social drinker  . Drug use: Not Currently    Types: Cocaine, Marijuana    Comment: Positive UDS 12/2017    Review of Systems  Constitutional: No fever/chills Eyes: No visual changes. ENT: No sore throat. Cardiovascular: Denies chest  pain. Respiratory: Denies shortness of breath. Gastrointestinal:No nausea, no vomiting.  No diarrhea.  No constipation. Genitourinary: Negative for dysuria. Musculoskeletal: Negative for back pain. Skin: Negative for rash. Neurological: Negative for headaches, focal weakness or numbness.   ____________________________________________   PHYSICAL EXAM:  VITAL SIGNS: ED Triage Vitals [10/14/18 1220]  Enc Vitals Group     BP (!) 157/80     Pulse Rate 79     Resp 18     Temp 98.3 F (36.8 C)     Temp Source Oral     SpO2 97 %     Weight 150 lb (68 kg)     Height 4\' 11"  (1.499 m)     Head Circumference      Peak Flow      Pain Score 10     Pain Loc      Pain Edu?      Excl. in GC?     Constitutional: Alert and oriented. in no acute distress. Eyes: Conjunctivae are normal.  Head: Atraumatic. Nose: No congestion/rhinnorhea. Mouth/Throat: Mucous membranes are moist.  Neck: No stridor.   Cardiovascular: Normal rate, regular rhythm. Grossly normal heart sounds.  Respiratory: Normal respiratory effort.  No retractions. Lungs CTAB. Gastrointestinal: Soft with moderate tenderness of the left upper as well as lower abdomen with only mild right upper quadrant tenderness to palpation.  No distention. No CVA tenderness. Genitourinary: Normal external examination without any lesions.  Speculum exam with mild to moderate amount of white discharge.  Bimanual exam without CMT.  However there is uterine tenderness without any adnexal tenderness to palpation. Musculoskeletal: No lower extremity tenderness nor edema.  No joint effusions. Neurologic:  Normal speech and language. No gross focal neurologic deficits are appreciated. Skin:  Skin is warm, dry and intact. No rash noted. Psychiatric: Mood and affect are normal. Speech and behavior are normal.  ____________________________________________   LABS (all labs ordered are listed, but only abnormal results are displayed)  Labs Reviewed   WET PREP, GENITAL - Abnormal; Notable for the following components:      Result Value   Clue Cells Wet Prep HPF POC PRESENT (*)    WBC, Wet Prep HPF POC MANY (*)    All other components within normal limits  COMPREHENSIVE METABOLIC PANEL - Abnormal; Notable for the following components:   Creatinine, Ser 1.01 (*)    AST 10 (*)    All other components within normal limits  URINALYSIS, COMPLETE (UACMP) WITH MICROSCOPIC - Abnormal; Notable for the following components:   Color, Urine STRAW (*)    APPearance CLEAR (*)    All other components within normal limits  CHLAMYDIA/NGC RT PCR ([facility] ONLY)  LIPASE, BLOOD  CBC  POC URINE PREG, ED  POCT PREGNANCY, URINE   ____________________________________________  EKG  ED ECG REPORT I, Arelia Longest, the attending physician, personally viewed and interpreted this ECG.   Date: 10/14/2018  EKG Time: 1236  Rate: 71  Rhythm: normal sinus rhythm  Axis: Normal  Intervals:none  ST&T Change: No ST segment elevation or depression.  No abnormal T wave inversion.  Multiple biphasic T waves. No significant change from previous EKGs on the record.  Almost identical to that of 01/04/2018. ____________________________________________  RADIOLOGY  Negative for hydronephrosis or ureteral stone on the CT.  No uterine or adnexal masses identified on CT. ____________________________________________   PROCEDURES  Procedure(s) performed:   Procedures  Critical Care performed:   ____________________________________________   INITIAL IMPRESSION / ASSESSMENT AND PLAN / ED COURSE  Pertinent labs & imaging results that were available during my care of the patient were reviewed by me and considered in my medical decision making (see chart for details).  Differential diagnosis includes, but is not limited to, ovarian cyst, ovarian torsion, acute appendicitis, diverticulitis, urinary tract infection/pyelonephritis, endometriosis, bowel  obstruction, colitis, renal colic, gastroenteritis, hernia, fibroids, endometriosis, pregnancy related pain including ectopic pregnancy, etc. As part of my medical decision making, I reviewed the following data within the electronic MEDICAL RECORD NUMBER Notes from prior ED visits  ----------------------------------------- 4:21 PM on 10/14/2018 -----------------------------------------  Patient with many white blood cells as well as clue cells present.  Concern is for pelvic inflammatory disease.  Patient says that she has one sexual partner but that she has been using a "buildup" that she says gives her a rash whenever she uses it.  She will refrain from any sexual contact including the sex toy.  She will be treated for PID.  She knows that she must also tell her partners that they both may be tested and treated for further STD such as HIV, syphilis and herpes.  She will be following up with [facility].  He is understanding the diagnosis as well as treatment plan willing to comply.  We also discussed not drinking on the medications. ____________________________________________   FINAL CLINICAL IMPRESSION(S) / ED DIAGNOSES  Pelvic inflammatory disease.  Bacterial vaginosis.  NEW MEDICATIONS STARTED DURING THIS VISIT:  New Prescriptions   No medications on file     Note:  This document was prepared using Dragon voice recognition software and may include unintentional dictation errors.     Myrna Blazer, MD 10/14/18 1623    Pershing Proud Myra Rude, MD 10/14/18 [phone]

## 2018-10-14 NOTE — ED Triage Notes (Signed)
Pt arrived via POV with reports of generalized abdominal pain for the past 2-3 days, pt states the pain is passed a 10/10 and states it feels like "my insides are on fire"  Pt denies any N/V/D.

## 2018-10-23 ENCOUNTER — Emergency Department
Admission: EM | Admit: 2018-10-23 | Discharge: 2018-10-23 | Disposition: A | Discharge status: Home / Self Care | Payer: Medicaid Other | Attending: Emergency Medicine | Admitting: Emergency Medicine

## 2018-10-23 ENCOUNTER — Other Ambulatory Visit: Payer: Self-pay

## 2018-10-23 ENCOUNTER — Emergency Department: Payer: Medicaid Other

## 2018-10-23 ENCOUNTER — Encounter: Payer: Self-pay | Admitting: Emergency Medicine

## 2018-10-23 DIAGNOSIS — I10 Essential (primary) hypertension: Secondary | ICD-10-CM | POA: Diagnosis not present

## 2018-10-23 DIAGNOSIS — Z79899 Other long term (current) drug therapy: Secondary | ICD-10-CM | POA: Diagnosis not present

## 2018-10-23 DIAGNOSIS — Z91010 Allergy to peanuts: Secondary | ICD-10-CM | POA: Insufficient documentation

## 2018-10-23 DIAGNOSIS — R079 Chest pain, unspecified: Secondary | ICD-10-CM | POA: Insufficient documentation

## 2018-10-23 DIAGNOSIS — R0602 Shortness of breath: Secondary | ICD-10-CM | POA: Diagnosis not present

## 2018-10-23 DIAGNOSIS — F17210 Nicotine dependence, cigarettes, uncomplicated: Secondary | ICD-10-CM | POA: Insufficient documentation

## 2018-10-23 LAB — BASIC METABOLIC PANEL
ANION GAP: 8 (ref 5–15)
BUN: 12 mg/dL (ref 6–20)
CO2: 23 mmol/L (ref 22–32)
Calcium: 9.1 mg/dL (ref 8.9–10.3)
Chloride: 106 mmol/L (ref 98–111)
Creatinine, Ser: 0.87 mg/dL (ref 0.44–1.00)
Glucose, Bld: 112 mg/dL — ABNORMAL HIGH (ref 70–99)
Potassium: 3.6 mmol/L (ref 3.5–5.1)
SODIUM: 137 mmol/L (ref 135–145)

## 2018-10-23 LAB — CBC
HCT: 42.7 % (ref 36.0–46.0)
Hemoglobin: 13.7 g/dL (ref 12.0–15.0)
MCH: 28.2 pg (ref 26.0–34.0)
MCHC: 32.1 g/dL (ref 30.0–36.0)
MCV: 88 fL (ref 80.0–100.0)
NRBC: 0 % (ref 0.0–0.2)
PLATELETS: 223 10*3/uL (ref 150–400)
RBC: 4.85 MIL/uL (ref 3.87–5.11)
RDW: 13.7 % (ref 11.5–15.5)
WBC: 6.3 10*3/uL (ref 4.0–10.5)

## 2018-10-23 LAB — FIBRIN DERIVATIVES D-DIMER (ARMC ONLY): FIBRIN DERIVATIVES D-DIMER (ARMC): 214 ng{FEU}/mL (ref 0.00–499.00)

## 2018-10-23 LAB — TROPONIN I

## 2018-10-23 MED ORDER — ACETAMINOPHEN 500 MG PO TABS
ORAL_TABLET | ORAL | Status: AC
  Administered 2018-10-23: 1000 mg via ORAL
  Filled 2018-10-23: qty 2

## 2018-10-23 MED ORDER — ACETAMINOPHEN 500 MG PO TABS
1000.0000 mg | ORAL_TABLET | Freq: Once | ORAL | Status: AC
  Administered 2018-10-23: 1000 mg via ORAL

## 2018-10-23 MED ORDER — MORPHINE SULFATE (PF) 4 MG/ML IV SOLN
4.0000 mg | Freq: Once | INTRAVENOUS | Status: AC
  Administered 2018-10-23: 4 mg via INTRAVENOUS
  Filled 2018-10-23: qty 1

## 2018-10-23 MED ORDER — ONDANSETRON HCL 4 MG/2ML IJ SOLN
4.0000 mg | Freq: Once | INTRAMUSCULAR | Status: AC
  Administered 2018-10-23: 4 mg via INTRAVENOUS
  Filled 2018-10-23: qty 2

## 2018-10-23 NOTE — ED Provider Notes (Signed)
[facility] Emergency Department Provider Note   ____________________________________________   First MD Initiated Contact with Patient 10/23/18 1955     (approximate)  I have reviewed the triage vital signs and the nursing notes.   HISTORY  Chief Complaint Chest Pain   HPI [name] is a 32 y.o. female who woke up this morning with pain in the middle of her chest.  It slightly worse with deep breathing.  Achy in nature.  Nothing else really seems to make it better or worse.  She does not remember having this before.  Is been constant since it came on.  Any cough fever shortness of breath or other complaints.   Past Medical History:  Diagnosis Date  . Depression   . History of substance abuse (HCC)   . History of suicide attempt   . History of thyroid disease   . Hypertension   . Thyroid disease     Patient Active Problem List   Diagnosis Date Noted  . Abdominal pain 03/20/2018  . Cocaine use disorder, moderate, dependence (HCC) 01/01/2018  . Cannabis use disorder, moderate, dependence (HCC) 01/01/2018  . Pregnant 12/31/2017  . Medically noncompliant 12/31/2017  . Tobacco use disorder 01/12/2016  . Stimulant use disorder (HCC) (cocaine) 01/12/2016  . Alcohol use disorder, moderate, dependence (HCC) 01/12/2016  . Self-inflicted laceration of wrist 01/11/2016  . Major depressive disorder, recurrent severe without psychotic features (HCC) 01/11/2016    Past Surgical History:  Procedure Laterality Date  . CESAREAN SECTION  2000, 2002, 2013    Prior to Admission medications   Medication Sig Start Date End Date Taking? Authorizing Provider  albuterol (PROVENTIL HFA;VENTOLIN HFA) 108 (90 Base) MCG/ACT inhaler Inhale 2 puffs into the lungs every 6 (six) hours as needed for wheezing or shortness of breath. Patient not taking: Reported on 03/20/2018 02/27/18   Minna Antis, MD  doxycycline (VIBRAMYCIN) 100 MG capsule Take 1 capsule  (100 mg total) by mouth 2 (two) times daily. 10/14/18   Schaevitz, Myra Rude, MD  folic acid (FOLVITE) 1 MG tablet Take 1 tablet (1 mg total) by mouth daily. 01/05/18   Pucilowska, Braulio Conte B, MD  hydroxyprogesterone caproate (MAKENA) 250 mg/mL OIL injection Inject 1 mL (250 mg total) into the muscle once a week. On Tuesdays 01/09/18   Pucilowska, Ellin Goodie, MD  metroNIDAZOLE (FLAGYL) 500 MG tablet Take 1 tablet (500 mg total) by mouth 2 (two) times daily for 14 days. 10/14/18 10/28/18  Myrna Blazer, MD  pantoprazole (PROTONIX) 20 MG tablet Take 1 tablet (20 mg total) by mouth daily. 05/17/16 05/17/17  Jennye Moccasin, MD  Prenatal Vit-Fe Fumarate-FA (PRENATAL MULTIVITAMIN) TABS tablet Take 1 tablet by mouth daily at 12 noon. 01/04/18   Pucilowska, Braulio Conte B, MD  QUEtiapine (SEROQUEL) 50 MG tablet Take 1 tablet (50 mg total) by mouth at bedtime. Patient not taking: Reported on 03/20/2018 01/04/18   Pucilowska, Ellin Goodie, MD  senna (SENOKOT) 8.6 MG TABS tablet Take 1 tablet (8.6 mg total) by mouth 2 (two) times daily at 10 AM and 5 PM. Patient not taking: Reported on 03/20/2018 01/04/18   Pucilowska, Braulio Conte B, MD  sertraline (ZOLOFT) 50 MG tablet Take 1 tablet (50 mg total) by mouth at bedtime. Patient not taking: Reported on 03/20/2018 01/04/18   Shari Prows, MD    Allergies Aspirin; Banana; Other; Peanut oil; Peanut-containing drug products; and Pecan nut (diagnostic)  Family History  Problem Relation Age of Onset  . Diabetes  Mother     Social History Social History   Tobacco Use  . Smoking status: Current Every Day Smoker    Packs/day: 0.50  . Smokeless tobacco: Never Used  Substance Use Topics  . Alcohol use: Not Currently    Comment: social drinker  . Drug use: Not Currently    Types: Cocaine, Marijuana    Comment: Positive UDS 12/2017    Review of Systems  Constitutional: No fever/chills Eyes: No visual changes. ENT: No sore throat. Cardiovascular: See  HPI. Respiratory: Denies shortness of breath. Gastrointestinal: No abdominal pain.  No nausea, no vomiting.  No diarrhea.  No constipation. Genitourinary: Negative for dysuria. Musculoskeletal: Negative for back pain. Skin: Negative for rash. Neurological: Negative for headaches, focal weakness   ____________________________________________   PHYSICAL EXAM:  VITAL SIGNS: ED Triage Vitals  Enc Vitals Group     BP 10/23/18 1904 (!) 154/75     Pulse Rate 10/23/18 1904 71     Resp 10/23/18 1904 (!) 22     Temp 10/23/18 1904 97.8 F (36.6 C)     Temp Source 10/23/18 1904 Oral     SpO2 10/23/18 1904 100 %     Weight 10/23/18 1900 150 lb (68 kg)     Height 10/23/18 1900 4\' 11"  (1.499 m)     Head Circumference --      Peak Flow --      Pain Score 10/23/18 1900 10     Pain Loc --      Pain Edu? --      Excl. in GC? --     Constitutional: Alert and oriented. Well appearing and in no acute distress. Eyes: Conjunctivae are normal.  Head: Atraumatic. Nose: No congestion/rhinnorhea. Mouth/Throat: Mucous membranes are moist.  Oropharynx non-erythematous. Neck: No stridor.  Cardiovascular: Normal rate, regular rhythm. Grossly normal heart sounds.  Good peripheral circulation.  Tenderness to palpation Respiratory: Normal respiratory effort.  No retractions. Lungs CTAB. Gastrointestinal: Soft and nontender. No distention. No abdominal bruits. No CVA tenderness. Musculoskeletal: No lower extremity tenderness nor edema.  No joint effusions. Neurologic:  Normal speech and language. No gross focal neurologic deficits are appreciated Skin:  Skin is warm, dry and intact. No rash noted. Psychiatric: Mood and affect are normal. Speech and behavior are normal.  ____________________________________________   LABS (all labs ordered are listed, but only abnormal results are displayed)  Labs Reviewed  BASIC METABOLIC PANEL - Abnormal; Notable for the following components:      Result Value    Glucose, Bld 112 (*)    All other components within normal limits  CBC  TROPONIN I  FIBRIN DERIVATIVES D-DIMER ([facility] ONLY)  POC URINE PREG, ED   ____________________________________________  EKG  EKG read by me shows normal sinus rhythm rate of 63 normal axis nonspecific ST-T changes were present on previous EKGs ____________________________________________  RADIOLOGY  ED MD interpretation: Chest x-ray read by radiology read is reviewed by me is clear  Official radiology report(s): Dg Chest 2 View  Result Date: 10/23/2018 CLINICAL DATA:  Chest pain EXAM: CHEST - 2 VIEW COMPARISON:  03/23/2018 FINDINGS: The heart size and mediastinal contours are within normal limits. Both lungs are clear. Unchanged dextroscoliosis. IMPRESSION: Clear lungs. Electronically Signed   By: Deatra Robinson M.D.   On: 10/23/2018 19:17    ____________________________________________   PROCEDURES  Procedure(s) performed:   Procedures  Critical Care performed:   ____________________________________________   INITIAL IMPRESSION / ASSESSMENT AND PLAN / ED COURSE  Patient  with chest pain center for chest which does not sound cardiac.  Troponin is negative EKG is unchanged.  We are still waiting on d-dimer to come back.  I will sign this patient out to Dr. Sharma Covert.         ____________________________________________   FINAL CLINICAL IMPRESSION(S) / ED DIAGNOSES  Final diagnoses:  Chest pain, unspecified type     ED Discharge Orders    None       Note:  This document was prepared using Dragon voice recognition software and may include unintentional dictation errors.    Arnaldo Natal, MD 10/23/18 2130

## 2018-10-23 NOTE — ED Triage Notes (Signed)
Pt c/o left sided mid axillary chest pain since this morning.  Pain is constant.  Described as achy.  Pain not made worse or better by anything.  Denies any symptoms besides pain. Has been under a lot of stress. Color WNL. VSS. NAD

## 2018-10-23 NOTE — Discharge Instructions (Addendum)
Please make an appointment to follow-up with a primary care physician.  Return to the emergency department for severe pain, lightheadedness or fainting, palpitations, shortness of breath, or any other symptoms concerning to you.

## 2018-10-23 NOTE — ED Notes (Signed)
Urine POC negative. 

## 2018-10-23 NOTE — ED Triage Notes (Signed)
Pt called from [facility] to triage room, no response 

## 2018-10-30 ENCOUNTER — Encounter: Payer: Self-pay | Admitting: Emergency Medicine

## 2018-10-30 ENCOUNTER — Emergency Department
Admission: EM | Admit: 2018-10-30 | Discharge: 2018-10-30 | Disposition: A | Discharge status: Home / Self Care | Payer: Medicaid Other | Attending: Emergency Medicine | Admitting: Emergency Medicine

## 2018-10-30 ENCOUNTER — Other Ambulatory Visit: Payer: Self-pay

## 2018-10-30 ENCOUNTER — Emergency Department: Payer: Medicaid Other

## 2018-10-30 DIAGNOSIS — I10 Essential (primary) hypertension: Secondary | ICD-10-CM | POA: Diagnosis not present

## 2018-10-30 DIAGNOSIS — B9789 Other viral agents as the cause of diseases classified elsewhere: Secondary | ICD-10-CM

## 2018-10-30 DIAGNOSIS — Z79899 Other long term (current) drug therapy: Secondary | ICD-10-CM | POA: Diagnosis not present

## 2018-10-30 DIAGNOSIS — J069 Acute upper respiratory infection, unspecified: Secondary | ICD-10-CM | POA: Insufficient documentation

## 2018-10-30 DIAGNOSIS — F17210 Nicotine dependence, cigarettes, uncomplicated: Secondary | ICD-10-CM | POA: Diagnosis not present

## 2018-10-30 DIAGNOSIS — R05 Cough: Secondary | ICD-10-CM | POA: Diagnosis present

## 2018-10-30 MED ORDER — BENZONATATE 100 MG PO CAPS: 200.0000 mg | ORAL_CAPSULE | Freq: Three times a day (TID) | ORAL | 0 refills | Status: AC | PRN

## 2018-10-30 MED ORDER — FEXOFENADINE-PSEUDOEPHED ER 60-120 MG PO TB12: 1.0000 | ORAL_TABLET | Freq: Two times a day (BID) | ORAL | 0 refills | Status: DC

## 2018-10-30 MED ORDER — BENZONATATE 100 MG PO CAPS
200.0000 mg | ORAL_CAPSULE | Freq: Once | ORAL | Status: AC
  Administered 2018-10-30: 200 mg via ORAL
  Filled 2018-10-30: qty 2

## 2018-10-30 MED ORDER — OXYMETAZOLINE HCL 0.05 % NA SOLN
1.0000 | Freq: Once | NASAL | Status: AC
  Administered 2018-10-30: 1 via NASAL
  Filled 2018-10-30: qty 15

## 2018-10-30 MED ORDER — IBUPROFEN 600 MG PO TABS
600.0000 mg | ORAL_TABLET | Freq: Once | ORAL | Status: DC
  Filled 2018-10-30: qty 1

## 2018-10-30 NOTE — ED Notes (Signed)
See triage note  Presents with cough for 3 days and some sinus pressure   deneis any fever and afebrile on arrival

## 2018-10-30 NOTE — ED Triage Notes (Signed)
Pt states cough, headache and sinus congestion for 3 days now. NAD.

## 2018-10-30 NOTE — Discharge Instructions (Addendum)
Follow discharge care instruction take medication as directed. °

## 2018-10-30 NOTE — ED Provider Notes (Signed)
[facility] Emergency Department Provider Note   ____________________________________________   First MD Initiated Contact with Patient 10/30/18 [phone]     (approximate)  I have reviewed the triage vital signs and the nursing notes.   HISTORY  Chief Complaint Headache and Cough    HPI [name] is a 32 y.o. female patient presents with 3 days of cough, headache, sinus congestion.  Patient also state hurts to breathe.  Patient denies fever/chills.  Patient denies nausea, vomiting, diarrhea.  Patient rates the pain as a 10/10.  Patient described the pain is "ache".  No palliative measure for complaint.  Past Medical History:  Diagnosis Date  . Depression   . History of substance abuse (HCC)   . History of suicide attempt   . History of thyroid disease   . Hypertension   . Thyroid disease     Patient Active Problem List   Diagnosis Date Noted  . Abdominal pain 03/20/2018  . Cocaine use disorder, moderate, dependence (HCC) 01/01/2018  . Cannabis use disorder, moderate, dependence (HCC) 01/01/2018  . Pregnant 12/31/2017  . Medically noncompliant 12/31/2017  . Tobacco use disorder 01/12/2016  . Stimulant use disorder (HCC) (cocaine) 01/12/2016  . Alcohol use disorder, moderate, dependence (HCC) 01/12/2016  . Self-inflicted laceration of wrist 01/11/2016  . Major depressive disorder, recurrent severe without psychotic features (HCC) 01/11/2016    Past Surgical History:  Procedure Laterality Date  . CESAREAN SECTION  2000, 2002, 2013    Prior to Admission medications   Medication Sig Start Date End Date Taking? Authorizing Provider  albuterol (PROVENTIL HFA;VENTOLIN HFA) 108 (90 Base) MCG/ACT inhaler Inhale 2 puffs into the lungs every 6 (six) hours as needed for wheezing or shortness of breath. Patient not taking: Reported on 03/20/2018 02/27/18   Minna Antis, MD  benzonatate (TESSALON PERLES) 100 MG capsule Take 2 capsules (200 mg  total) by mouth 3 (three) times daily as needed. 10/30/18 10/30/19  Joni Reining, PA-C  doxycycline (VIBRAMYCIN) 100 MG capsule Take 1 capsule (100 mg total) by mouth 2 (two) times daily. 10/14/18   Schaevitz, Myra Rude, MD  fexofenadine-pseudoephedrine (ALLEGRA-D) 60-120 MG 12 hr tablet Take 1 tablet by mouth 2 (two) times daily. 10/30/18   Joni Reining, PA-C  folic acid (FOLVITE) 1 MG tablet Take 1 tablet (1 mg total) by mouth daily. 01/05/18   Pucilowska, Braulio Conte B, MD  hydroxyprogesterone caproate (MAKENA) 250 mg/mL OIL injection Inject 1 mL (250 mg total) into the muscle once a week. On Tuesdays 01/09/18   Pucilowska, Braulio Conte B, MD  pantoprazole (PROTONIX) 20 MG tablet Take 1 tablet (20 mg total) by mouth daily. 05/17/16 05/17/17  Jennye Moccasin, MD  Prenatal Vit-Fe Fumarate-FA (PRENATAL MULTIVITAMIN) TABS tablet Take 1 tablet by mouth daily at 12 noon. 01/04/18   Pucilowska, Braulio Conte B, MD  QUEtiapine (SEROQUEL) 50 MG tablet Take 1 tablet (50 mg total) by mouth at bedtime. Patient not taking: Reported on 03/20/2018 01/04/18   Pucilowska, Ellin Goodie, MD  senna (SENOKOT) 8.6 MG TABS tablet Take 1 tablet (8.6 mg total) by mouth 2 (two) times daily at 10 AM and 5 PM. Patient not taking: Reported on 03/20/2018 01/04/18   Pucilowska, Braulio Conte B, MD  sertraline (ZOLOFT) 50 MG tablet Take 1 tablet (50 mg total) by mouth at bedtime. Patient not taking: Reported on 03/20/2018 01/04/18   Shari Prows, MD    Allergies Aspirin; Banana; Other; Peanut oil; Peanut-containing drug products; and Pecan nut (diagnostic)  Family History  Problem Relation Age of Onset  . Diabetes Mother     Social History Social History   Tobacco Use  . Smoking status: Current Every Day Smoker    Packs/day: 0.50  . Smokeless tobacco: Never Used  Substance Use Topics  . Alcohol use: Not Currently    Comment: social drinker  . Drug use: Not Currently    Types: Cocaine, Marijuana    Comment: Positive UDS 12/2017      Review of Systems Constitutional: No fever/chills Eyes: No visual changes. ENT: Sinus congestion t. Cardiovascular: Denies chest pain. Respiratory: Denies shortness of breath.  Nonproductive cough Gastrointestinal: No abdominal pain.  No nausea, no vomiting.  No diarrhea.  No constipation. Genitourinary: Negative for dysuria. Musculoskeletal: Negative for back pain. Skin: Negative for rash. Neurological: Positive for headaches, but denies focal weakness or numbness. Psychiatric:Depression, suicide attempt, and substance abuse. Endocrine:Hypertension and hypothyroidism. Allergic/Immunilogical: See medication list ____________________________________________   PHYSICAL EXAM:  VITAL SIGNS: ED Triage Vitals  Enc Vitals Group     BP 10/30/18 0738 (!) 171/90     Pulse Rate 10/30/18 0738 88     Resp 10/30/18 0738 18     Temp 10/30/18 0738 98.2 F (36.8 C)     Temp Source 10/30/18 0738 Oral     SpO2 10/30/18 0741 95 %     Weight 10/30/18 0739 135 lb (61.2 kg)     Height 10/30/18 0739 4\' 11"  (1.499 m)     Head Circumference --      Peak Flow --      Pain Score 10/30/18 0739 10     Pain Loc --      Pain Edu? --      Excl. in GC? --    Constitutional: Alert and oriented. Well appearing and in no acute distress.  Poor body hygiene.  Clothing consist of T-shirt and underwear. Eyes: Conjunctivae are normal. PERRL. EOMI. Head: Atraumatic. Nose: No congestion/rhinnorhea. Mouth/Throat: Mucous membranes are moist.  Oropharynx non-erythematous. Neck: No stridor. Cardiovascular: Normal rate, regular rhythm. Grossly normal heart sounds.  Good peripheral circulation.  Elevated blood pressure Respiratory: Normal respiratory effort.  No retractions. Lungs CTAB. Gastrointestinal: Soft and nontender. No distention. No abdominal bruits. No CVA tenderness. Musculoskeletal: No lower extremity tenderness nor edema.  No joint effusions. Neurologic:  Normal speech and language. No gross focal  neurologic deficits are appreciated. No gait instability. Skin:  Skin is warm, dry and intact. No rash noted. Psychiatric: Mood and affect are normal. Speech and behavior are normal.  ____________________________________________   LABS (all labs ordered are listed, but only abnormal results are displayed)  Labs Reviewed - No data to display ____________________________________________  EKG   ____________________________________________  RADIOLOGY  ED MD interpretation:    Official radiology report(s): Dg Chest 2 View  Result Date: 10/30/2018 CLINICAL DATA:  Nonproductive cough.  Shortness of breath. EXAM: CHEST - 2 VIEW COMPARISON:  10/23/2018. FINDINGS: Mediastinum and hilar structures normal. Heart size normal. No focal infiltrate. No pleural effusion or pneumothorax. Thoracolumbar spine scoliosis. IMPRESSION: No acute cardiopulmonary disease. Electronically Signed   By: Maisie Fus  Register   On: 10/30/2018 08:36    ____________________________________________   PROCEDURES  Procedure(s) performed: None  Procedures  Critical Care performed: No  ____________________________________________   INITIAL IMPRESSION / ASSESSMENT AND PLAN / ED COURSE  As part of my medical decision making, I reviewed the following data within the electronic MEDICAL RECORD NUMBER    Patient presents with cough and sinus  congestion for 3 days consistent with a viral respiratory infection.  Discussed negative chest x-ray finding with patient.  Patient given discharge care instruction advised take medication as directed.  [facility]      ____________________________________________   FINAL CLINICAL IMPRESSION(S) / ED DIAGNOSES  Final diagnoses:  Viral URI with cough     ED Discharge Orders         Ordered    fexofenadine-pseudoephedrine (ALLEGRA-D) 60-120 MG 12 hr tablet  2 times daily     10/30/18 0845    benzonatate (TESSALON PERLES) 100 MG  capsule  3 times daily PRN     10/30/18 0845           Note:  This document was prepared using Dragon voice recognition software and may include unintentional dictation errors.    Joni Reining, PA-C 10/30/18 0848    Rockne Menghini, MD 10/30/18 1431

## 2018-11-09 ENCOUNTER — Ambulatory Visit
Admission: EM | Admit: 2018-11-09 | Discharge: 2018-11-09 | Disposition: A | Discharge status: Home / Self Care | Payer: No Typology Code available for payment source | Attending: Emergency Medicine | Admitting: Emergency Medicine

## 2018-11-09 ENCOUNTER — Encounter: Payer: Self-pay | Admitting: Emergency Medicine

## 2018-11-09 ENCOUNTER — Emergency Department
Admission: EM | Admit: 2018-11-09 | Discharge: 2018-11-09 | Disposition: A | Discharge status: Home / Self Care | Payer: Medicaid Other | Attending: Emergency Medicine | Admitting: Emergency Medicine

## 2018-11-09 ENCOUNTER — Other Ambulatory Visit: Payer: Self-pay

## 2018-11-09 DIAGNOSIS — I10 Essential (primary) hypertension: Secondary | ICD-10-CM | POA: Insufficient documentation

## 2018-11-09 DIAGNOSIS — T7421XA Adult sexual abuse, confirmed, initial encounter: Secondary | ICD-10-CM | POA: Diagnosis not present

## 2018-11-09 DIAGNOSIS — Z0441 Encounter for examination and observation following alleged adult rape: Secondary | ICD-10-CM | POA: Insufficient documentation

## 2018-11-09 DIAGNOSIS — T7621XA Adult sexual abuse, suspected, initial encounter: Secondary | ICD-10-CM | POA: Diagnosis present

## 2018-11-09 DIAGNOSIS — F17200 Nicotine dependence, unspecified, uncomplicated: Secondary | ICD-10-CM | POA: Insufficient documentation

## 2018-11-09 DIAGNOSIS — Z79899 Other long term (current) drug therapy: Secondary | ICD-10-CM | POA: Insufficient documentation

## 2018-11-09 LAB — POCT PREGNANCY, URINE: Preg Test, Ur: NEGATIVE

## 2018-11-09 MED ORDER — IBUPROFEN 600 MG PO TABS
600.0000 mg | ORAL_TABLET | Freq: Once | ORAL | Status: AC
  Administered 2018-11-09: 600 mg via ORAL
  Filled 2018-11-09: qty 1

## 2018-11-09 MED ORDER — CEFTRIAXONE SODIUM 250 MG IJ SOLR
250.0000 mg | Freq: Once | INTRAMUSCULAR | Status: AC
  Administered 2018-11-09: 250 mg via INTRAMUSCULAR
  Filled 2018-11-09: qty 250

## 2018-11-09 MED ORDER — LIDOCAINE HCL (PF) 1 % IJ SOLN
0.9000 mL | Freq: Once | INTRAMUSCULAR | Status: AC
  Administered 2018-11-09: 0.9 mL
  Filled 2018-11-09: qty 5

## 2018-11-09 MED ORDER — LEVONORGESTREL 1.5 MG PO TABS
1.5000 mg | ORAL_TABLET | Freq: Once | ORAL | Status: AC
  Administered 2018-11-09: 1.5 mg via ORAL
  Filled 2018-11-09 (×2): qty 1

## 2018-11-09 MED ORDER — ACETAMINOPHEN 500 MG PO TABS
1000.0000 mg | ORAL_TABLET | Freq: Once | ORAL | Status: AC
  Administered 2018-11-09: 1000 mg via ORAL
  Filled 2018-11-09: qty 2
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
## 2018-11-09 NOTE — ED Provider Notes (Signed)
SANE nurse has finished evaluation. Will discharge with paperwork prepared by SANE nurse.    Phineas SemenGoodman, Inocencia Murtaugh, MD 11/09/18 [phone]
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
## 2018-11-09 NOTE — ED Notes (Signed)
[facility] at bedside.

## 2018-11-09 NOTE — ED Notes (Signed)
SANE RN stated she is on her way at this time.

## 2018-11-09 NOTE — ED Notes (Signed)
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
## 2018-11-09 NOTE — ED Notes (Signed)
Pt took pt identification bracelet off and refuses to put it on. States "I can't " when asked if she needs a new bracelet to wear she states she won't wear it. Then begins yelling at this RN stating "get out of my face." this RN was standing across the room from pt. Pt continues to yell that this RN is being a jerk. Officer spoke up and stated to pt that she needs to cooperate. Pt states "I'm trying to stay calm but I need something for anxiety. I have stuff at home." will inform MD of pt request.

## 2018-11-09 NOTE — ED Triage Notes (Signed)
Spoke with lindsey with SANE.  She will come after medically cleared.
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
## 2018-11-09 NOTE — ED Notes (Addendum)
SANE nurse present to speak with patient. Representatives from [facility] also present to speak with patient and provide support and resources as needed.

## 2018-11-09 NOTE — ED Notes (Signed)
Pt states she is eager to leave and refused VS recheck or to sign for discharge papers. Pt leaving with mother.
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
## 2018-11-09 NOTE — ED Notes (Signed)
Meal tray provided.
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
## 2018-11-10 ENCOUNTER — Inpatient Hospital Stay (HOSPITAL_COMMUNITY): Admit: 2018-11-10 | Payer: Self-pay

## 2018-11-21 ENCOUNTER — Encounter: Payer: Self-pay | Admitting: Emergency Medicine

## 2018-11-21 ENCOUNTER — Other Ambulatory Visit: Payer: Self-pay

## 2018-11-21 ENCOUNTER — Emergency Department
Admission: EM | Admit: 2018-11-21 | Discharge: 2018-11-21 | Disposition: A | Discharge status: Home / Self Care | Payer: Medicaid Other | Attending: Emergency Medicine | Admitting: Emergency Medicine

## 2018-11-21 DIAGNOSIS — Z79899 Other long term (current) drug therapy: Secondary | ICD-10-CM | POA: Insufficient documentation

## 2018-11-21 DIAGNOSIS — R112 Nausea with vomiting, unspecified: Secondary | ICD-10-CM | POA: Diagnosis present

## 2018-11-21 DIAGNOSIS — Z91010 Allergy to peanuts: Secondary | ICD-10-CM | POA: Insufficient documentation

## 2018-11-21 DIAGNOSIS — F17200 Nicotine dependence, unspecified, uncomplicated: Secondary | ICD-10-CM | POA: Diagnosis not present

## 2018-11-21 DIAGNOSIS — I10 Essential (primary) hypertension: Secondary | ICD-10-CM | POA: Diagnosis not present

## 2018-11-21 DIAGNOSIS — K529 Noninfective gastroenteritis and colitis, unspecified: Secondary | ICD-10-CM | POA: Diagnosis not present

## 2018-11-21 MED ORDER — DICYCLOMINE HCL 10 MG PO CAPS: 10.0000 mg | ORAL_CAPSULE | Freq: Three times a day (TID) | ORAL | 0 refills | Status: DC | PRN

## 2018-11-21 MED ORDER — FAMOTIDINE 20 MG PO TABS
20.0000 mg | ORAL_TABLET | Freq: Once | ORAL | Status: AC
  Administered 2018-11-21: 20 mg via ORAL
  Filled 2018-11-21: qty 1

## 2018-11-21 MED ORDER — ONDANSETRON 4 MG PO TBDP
8.0000 mg | ORAL_TABLET | Freq: Once | ORAL | Status: AC
  Administered 2018-11-21: 8 mg via ORAL
  Filled 2018-11-21: qty 2

## 2018-11-21 MED ORDER — ONDANSETRON 8 MG PO TBDP: 8.0000 mg | ORAL_TABLET | Freq: Three times a day (TID) | ORAL | 0 refills | Status: DC | PRN

## 2018-11-21 MED ORDER — DICYCLOMINE HCL 10 MG PO CAPS
10.0000 mg | ORAL_CAPSULE | Freq: Once | ORAL | Status: AC
  Administered 2018-11-21: 10 mg via ORAL
  Filled 2018-11-21: qty 1

## 2018-11-21 NOTE — ED Triage Notes (Signed)
Pt to ED from home c/o n/v/d that started this morning.  States between 5-10 episodes of vomiting and diarrhea each with generalized abd pain.  No fevers at home, son at home with same symptoms.  Pt ambulatory with steady gait, chest rise even and unlabored, NAD at this time.

## 2018-11-21 NOTE — ED Notes (Signed)
Patient verbalized understanding of discharge instructions, no questions. Informed patient prescriptions were called into [facility], patient states, "the medications did not help anyways so I'm not going to pick them up." Informed patient they were there if she needed them. Patient does not want discharge instructions but this RN verbally reviewed them with patient. Patient ambulated out of ED with steady gait in no distress.

## 2018-11-21 NOTE — Discharge Instructions (Signed)
Return to the ER for new, worsening, persistent severe pain, vomiting, fever, or any other new or worsening symptoms that concern you.

## 2018-11-21 NOTE — ED Provider Notes (Signed)
[facility] Emergency Department Provider Note ____________________________________________   First MD Initiated Contact with Patient 11/21/18 1116     (approximate)  I have reviewed the triage vital signs and the nursing notes.   HISTORY  Chief Complaint Nausea; Emesis; and Diarrhea    HPI [name] is a 32 y.o. female with PMH as noted below who presents with nausea, vomiting, and diarrhea, acute onset this morning and present over the last few hours.  The patient states that she vomited within the last hour.  She states that her son also has similar symptoms but nobody else in the family is sick.  She denies any unusual foods, changes in her diet, travel, or changes in medication.  She reports some epigastric abdominal pain.  Past Medical History:  Diagnosis Date  . Depression   . History of substance abuse (HCC)   . History of suicide attempt   . History of thyroid disease   . Hypertension   . Thyroid disease     Patient Active Problem List   Diagnosis Date Noted  . Abdominal pain 03/20/2018  . Cocaine use disorder, moderate, dependence (HCC) 01/01/2018  . Cannabis use disorder, moderate, dependence (HCC) 01/01/2018  . Pregnant 12/31/2017  . Medically noncompliant 12/31/2017  . Tobacco use disorder 01/12/2016  . Stimulant use disorder (HCC) (cocaine) 01/12/2016  . Alcohol use disorder, moderate, dependence (HCC) 01/12/2016  . Self-inflicted laceration of wrist 01/11/2016  . Major depressive disorder, recurrent severe without psychotic features (HCC) 01/11/2016    Past Surgical History:  Procedure Laterality Date  . CESAREAN SECTION  2000, 2002, 2013    Prior to Admission medications   Medication Sig Start Date End Date Taking? Authorizing Provider  albuterol (PROVENTIL HFA;VENTOLIN HFA) 108 (90 Base) MCG/ACT inhaler Inhale 2 puffs into the lungs every 6 (six) hours as needed for wheezing or shortness of breath. Patient not  taking: Reported on 03/20/2018 02/27/18   Minna Antis, MD  benzonatate (TESSALON PERLES) 100 MG capsule Take 2 capsules (200 mg total) by mouth 3 (three) times daily as needed. 10/30/18 10/30/19  Joni Reining, PA-C  dicyclomine (BENTYL) 10 MG capsule Take 1 capsule (10 mg total) by mouth 3 (three) times daily as needed for up to 2 days for spasms. 11/21/18 11/23/18  Dionne Bucy, MD  doxycycline (VIBRAMYCIN) 100 MG capsule Take 1 capsule (100 mg total) by mouth 2 (two) times daily. 10/14/18   Schaevitz, Myra Rude, MD  fexofenadine-pseudoephedrine (ALLEGRA-D) 60-120 MG 12 hr tablet Take 1 tablet by mouth 2 (two) times daily. 10/30/18   Joni Reining, PA-C  folic acid (FOLVITE) 1 MG tablet Take 1 tablet (1 mg total) by mouth daily. 01/05/18   Pucilowska, Braulio Conte B, MD  hydroxyprogesterone caproate (MAKENA) 250 mg/mL OIL injection Inject 1 mL (250 mg total) into the muscle once a week. On Tuesdays 01/09/18   Pucilowska, Braulio Conte B, MD  ondansetron (ZOFRAN ODT) 8 MG disintegrating tablet Take 1 tablet (8 mg total) by mouth every 8 (eight) hours as needed for nausea or vomiting. 11/21/18   Dionne Bucy, MD  pantoprazole (PROTONIX) 20 MG tablet Take 1 tablet (20 mg total) by mouth daily. 05/17/16 05/17/17  Jennye Moccasin, MD  Prenatal Vit-Fe Fumarate-FA (PRENATAL MULTIVITAMIN) TABS tablet Take 1 tablet by mouth daily at 12 noon. 01/04/18   Pucilowska, Braulio Conte B, MD  QUEtiapine (SEROQUEL) 50 MG tablet Take 1 tablet (50 mg total) by mouth at bedtime. Patient not taking: Reported on 03/20/2018  01/04/18   Pucilowska, Ellin Goodie, MD  senna (SENOKOT) 8.6 MG TABS tablet Take 1 tablet (8.6 mg total) by mouth 2 (two) times daily at 10 AM and 5 PM. Patient not taking: Reported on 03/20/2018 01/04/18   Pucilowska, Braulio Conte B, MD  sertraline (ZOLOFT) 50 MG tablet Take 1 tablet (50 mg total) by mouth at bedtime. Patient not taking: Reported on 03/20/2018 01/04/18   Shari Prows, MD     Allergies Aspirin; Banana; Other; Peanut oil; Peanut-containing drug products; and Pecan nut (diagnostic)  Family History  Problem Relation Age of Onset  . Diabetes Mother     Social History Social History   Tobacco Use  . Smoking status: Current Every Day Smoker    Packs/day: 0.50  . Smokeless tobacco: Never Used  Substance Use Topics  . Alcohol use: Not Currently    Comment: social drinker  . Drug use: Not Currently    Types: Cocaine, Marijuana    Comment: Positive UDS 12/2017    Review of Systems  Constitutional: No fever/chills Eyes: No redness. ENT: No sore throat. Cardiovascular: Denies chest pain. Respiratory: Denies shortness of breath. Gastrointestinal: Positive for vomiting and diarrhea.  Genitourinary: Negative for dysuria.  Musculoskeletal: Negative for back pain. Skin: Negative for rash. Neurological: Negative for headache.   ____________________________________________   PHYSICAL EXAM:  VITAL SIGNS: ED Triage Vitals  Enc Vitals Group     BP 11/21/18 1111 (!) 154/99     Pulse Rate 11/21/18 1111 88     Resp 11/21/18 1111 16     Temp 11/21/18 1111 (!) 97.4 F (36.3 C)     Temp Source 11/21/18 1111 Axillary     SpO2 11/21/18 1111 99 %     Weight 11/21/18 1113 160 lb (72.6 kg)     Height 11/21/18 1113 4\' 11"  (1.499 m)     Head Circumference --      Peak Flow --      Pain Score 11/21/18 1113 10     Pain Loc --      Pain Edu? --      Excl. in GC? --     Constitutional: Alert and oriented. Well appearing and in no acute distress. Eyes: Conjunctivae are normal.  Head: Atraumatic. Nose: No congestion/rhinnorhea. Mouth/Throat: Mucous membranes are moist.   Neck: Normal range of motion.  Cardiovascular: Good peripheral circulation. Respiratory: Normal respiratory effort. Gastrointestinal: Soft with minimal epigastric discomfort. No distention.  Genitourinary: No flank tenderness. Musculoskeletal: No lower extremity edema.  Extremities  warm and well perfused.  Neurologic:  Normal speech and language. No gross focal neurologic deficits are appreciated.  Skin:  Skin is warm and dry. No rash noted. Psychiatric: Mood and affect are normal. Speech and behavior are normal.  ____________________________________________   LABS (all labs ordered are listed, but only abnormal results are displayed)  Labs Reviewed - No data to display ____________________________________________  EKG   ____________________________________________  RADIOLOGY    ____________________________________________   PROCEDURES  Procedure(s) performed: No  Procedures  Critical Care performed: No ____________________________________________   INITIAL IMPRESSION / ASSESSMENT AND PLAN / ED COURSE  Pertinent labs & imaging results that were available during my care of the patient were reviewed by me and considered in my medical decision making (see chart for details).  32 year old female with PMH as noted above presents with nausea, vomiting, and diarrhea over the last several hours with some epigastric abdominal pain.  Her 69-year-old son has similar symptoms.  On exam the patient  is well-appearing and her vital signs are normal except for hypertension.  Her abdomen is soft with minimal epigastric discomfort but no focal tenderness.  Presentation is consistent with viral gastroenteritis versus foodborne illness.  There is no indication for lab work-up.  We will give symptomatic treatment with p.o. medication.  ----------------------------------------- 12:31 PM on 11/21/2018 -----------------------------------------  Patient still has some mild epigastric pain, but has had no further vomiting and appears comfortable.  She is stable for discharge at this time.  Return precautions given, and she expresses understanding. ____________________________________________   FINAL CLINICAL IMPRESSION(S) / ED DIAGNOSES  Final diagnoses:   Gastroenteritis      NEW MEDICATIONS STARTED DURING THIS VISIT:  New Prescriptions   DICYCLOMINE (BENTYL) 10 MG CAPSULE    Take 1 capsule (10 mg total) by mouth 3 (three) times daily as needed for up to 2 days for spasms.   ONDANSETRON (ZOFRAN ODT) 8 MG DISINTEGRATING TABLET    Take 1 tablet (8 mg total) by mouth every 8 (eight) hours as needed for nausea or vomiting.     Note:  This document was prepared using Dragon voice recognition software and may include unintentional dictation errors.    Dionne BucySiadecki, Jannessa Ogden, MD 11/21/18 1231

## 2019-04-27 ENCOUNTER — Emergency Department
Admission: EM | Admit: 2019-04-27 | Discharge: 2019-04-27 | Disposition: A | Discharge status: Home / Self Care | Payer: Medicaid Other | Attending: Student in an Organized Health Care Education/Training Program | Admitting: Student in an Organized Health Care Education/Training Program

## 2019-04-27 ENCOUNTER — Other Ambulatory Visit: Payer: Self-pay

## 2019-04-27 DIAGNOSIS — K0889 Other specified disorders of teeth and supporting structures: Secondary | ICD-10-CM | POA: Diagnosis not present

## 2019-04-27 DIAGNOSIS — F17210 Nicotine dependence, cigarettes, uncomplicated: Secondary | ICD-10-CM | POA: Diagnosis not present

## 2019-04-27 MED ORDER — AMOXICILLIN 500 MG PO CAPS: 500.0000 mg | ORAL_CAPSULE | Freq: Three times a day (TID) | ORAL | 0 refills | Status: AC

## 2019-04-27 MED ORDER — HYDROCODONE-ACETAMINOPHEN 5-325 MG PO TABS
1.0000 | ORAL_TABLET | Freq: Once | ORAL | Status: AC
  Administered 2019-04-27: 01:00:00 1 via ORAL
  Filled 2019-04-27: qty 1

## 2019-04-27 NOTE — ED Provider Notes (Signed)
[facility] Emergency Department Provider Note    First MD Initiated Contact with Patient 04/27/19 [phone]     (approximate)  I have reviewed the triage vital signs and the nursing notes.   HISTORY  Chief Complaint Dental Pain    HPI [name] is a 33 y.o. female with below listed past medical history presents the ER for worsening left lower jaw and tooth pain.  States she had had issues with that area ever since she had her wisdom teeth pulled out over a year ago.   She denies any fevers.  No nausea or vomiting.  Has not been on any antibiotics for this.  Denies any pain or swelling in her tongue.  No difficulty swallowing.   Past Medical History:  Diagnosis Date  . Depression   . History of substance abuse (HCC)   . History of suicide attempt   . History of thyroid disease   . Hypertension   . Thyroid disease    Family History  Problem Relation Age of Onset  . Diabetes Mother    Past Surgical History:  Procedure Laterality Date  . CESAREAN SECTION  2000, 2002, 2013   Patient Active Problem List   Diagnosis Date Noted  . Abdominal pain 03/20/2018  . Cocaine use disorder, moderate, dependence (HCC) 01/01/2018  . Cannabis use disorder, moderate, dependence (HCC) 01/01/2018  . Pregnant 12/31/2017  . Medically noncompliant 12/31/2017  . Tobacco use disorder 01/12/2016  . Stimulant use disorder (HCC) (cocaine) 01/12/2016  . Alcohol use disorder, moderate, dependence (HCC) 01/12/2016  . Self-inflicted laceration of wrist 01/11/2016  . Major depressive disorder, recurrent severe without psychotic features (HCC) 01/11/2016      Prior to Admission medications   Medication Sig Start Date End Date Taking? Authorizing Provider  albuterol (PROVENTIL HFA;VENTOLIN HFA) 108 (90 Base) MCG/ACT inhaler Inhale 2 puffs into the lungs every 6 (six) hours as needed for wheezing or shortness of breath. Patient not taking: Reported on 03/20/2018 02/27/18    Minna Antis, MD  amoxicillin (AMOXIL) 500 MG capsule Take 1 capsule (500 mg total) by mouth 3 (three) times daily for 7 days. 04/27/19 05/04/19  Willy Eddy, MD  benzonatate (TESSALON PERLES) 100 MG capsule Take 2 capsules (200 mg total) by mouth 3 (three) times daily as needed. 10/30/18 10/30/19  Joni Reining, PA-C  dicyclomine (BENTYL) 10 MG capsule Take 1 capsule (10 mg total) by mouth 3 (three) times daily as needed for up to 2 days for spasms. 11/21/18 11/23/18  Dionne Bucy, MD  doxycycline (VIBRAMYCIN) 100 MG capsule Take 1 capsule (100 mg total) by mouth 2 (two) times daily. 10/14/18   Schaevitz, Myra Rude, MD  fexofenadine-pseudoephedrine (ALLEGRA-D) 60-120 MG 12 hr tablet Take 1 tablet by mouth 2 (two) times daily. 10/30/18   Joni Reining, PA-C  folic acid (FOLVITE) 1 MG tablet Take 1 tablet (1 mg total) by mouth daily. 01/05/18   Pucilowska, Braulio Conte B, MD  hydroxyprogesterone caproate (MAKENA) 250 mg/mL OIL injection Inject 1 mL (250 mg total) into the muscle once a week. On Tuesdays 01/09/18   Pucilowska, Braulio Conte B, MD  ondansetron (ZOFRAN ODT) 8 MG disintegrating tablet Take 1 tablet (8 mg total) by mouth every 8 (eight) hours as needed for nausea or vomiting. 11/21/18   Dionne Bucy, MD  pantoprazole (PROTONIX) 20 MG tablet Take 1 tablet (20 mg total) by mouth daily. 05/17/16 05/17/17  Jennye Moccasin, MD  Prenatal Vit-Fe Fumarate-FA (PRENATAL[facility]ULTIVITAMIN) TABS  tablet Take 1 tablet by mouth daily at 12 noon. 01/04/18   Pucilowska, Braulio ConteJolanta B, MD  QUEtiapine (SEROQUEL) 50 MG tablet Take 1 tablet (50 mg total) by mouth at bedtime. Patient not taking: Reported on 03/20/2018 01/04/18   Pucilowska, Ellin GoodieJolanta B, MD  senna (SENOKOT) 8.6 MG TABS tablet Take 1 tablet (8.6 mg total) by mouth 2 (two) times daily at 10 AM and 5 PM. Patient not taking: Reported on 03/20/2018 01/04/18   Pucilowska, Braulio ConteJolanta B, MD  sertraline (ZOLOFT) 50 MG tablet Take 1 tablet (50 mg total) by  mouth at bedtime. Patient not taking: Reported on 03/20/2018 01/04/18   Shari ProwsPucilowska, Jolanta B, MD    Allergies Aspirin; Banana; Other; Peanut oil; Peanut-containing drug products; and Pecan nut (diagnostic)    Social History Social History   Tobacco Use  . Smoking status: Current Every Day Smoker    Packs/day: 0.50  . Smokeless tobacco: Never Used  Substance Use Topics  . Alcohol use: Not Currently    Comment: social drinker  . Drug use: Not Currently    Types: Cocaine, Marijuana    Comment: Positive UDS 12/2017    Review of Systems Patient denies headaches, rhinorrhea, blurry vision, numbness, shortness of breath, chest pain, edema, cough, abdominal pain, nausea, vomiting, diarrhea, dysuria, fevers, rashes or hallucinations unless otherwise stated above in HPI. ____________________________________________   PHYSICAL EXAM:  VITAL SIGNS: Vitals:   04/27/19 0034  BP: (!) 169/80  Pulse: 87  Temp: 98.3 F (36.8 C)  SpO2: 99%    Constitutional: Alert and oriented.  Eyes: Conjunctivae are normal.  Head: Atraumatic. Nose: No congestion/rhinnorhea. Mouth/Throat: Mucous membranes are moist.  No evidence peritonsillar abscess.  No evidence of Ludwig's angina.  No edema.  Uvula is midline.  No PTA or RPA. Neck: No stridor. Painless ROM.  Cardiovascular: Normal rate, regular rhythm. Grossly normal heart sounds.  Good peripheral circulation. Respiratory: Normal respiratory effort.  No retractions. Lungs CTAB. Gastrointestinal: Soft and nontender. No distention. No abdominal bruits. No CVA tenderness. Genitourinary:  Musculoskeletal: No lower extremity tenderness nor edema.  No joint effusions. Neurologic:  Normal speech and language. No gross focal neurologic deficits are appreciated. No facial droop Skin:  Skin is warm, dry and intact. No rash noted. Psychiatric: Mood and affect are normal. Speech and behavior are normal.  ____________________________________________   LABS  (all labs ordered are listed, but only abnormal results are displayed)  No results found for this or any previous visit (from the past 24 hour(s)). ____________________________________________ ____________________________________________  RADIOLOGY   ____________________________________________   PROCEDURES  Procedure(s) performed:  Procedures    Critical Care performed: no ____________________________________________   INITIAL IMPRESSION / ASSESSMENT AND PLAN / ED COURSE  Pertinent labs & imaging results that were available during my care of the patient were reviewed by me and considered in my medical decision making (see chart for details).   DDX: dental caries, abscess, dry socket, ludiwsg, pta,  [name] is a [age] y.o. who p/w dental pain lastseveral months but worsening over past few days. No systemic symptoms. No fevers. Afebrile in ED. VSS. Exam as above. Poor dentition. Likely dental caries causing discomfort. Possible periapical abscess. No external focal drainable dental abscess identified. No evidence of Ludwig's, buccal cellulitis, mastoiditis, or airway compromise. Will treat pt with ABX, pain medication and dental referral. Low cost dental options handout provided to pt.      The patient was evaluated in Emergency Department today for the symptoms described in the history  of present illness. He/she was evaluated in the context of the global COVID-19 pandemic, which necessitated consideration that the patient might be at risk for infection with the SARS-CoV-2 virus that causes COVID-19. Institutional protocols and algorithms that pertain to the evaluation of patients at risk for COVID-19 are in a state of rapid change based on information released by regulatory bodies including the CDC and federal and state organizations. These policies and algorithms were followed during the patient's care in the ED.  As part of my medical decision making, I reviewed the  following data within the electronic MEDICAL RECORD NUMBER Nursing notes reviewed and incorporated, Labs reviewed, notes from prior ED visits and [place] Controlled Substance Database   ____________________________________________   FINAL CLINICAL IMPRESSION(S) / ED DIAGNOSES  Final diagnoses:  Pain, dental      NEW MEDICATIONS STARTED DURING THIS VISIT:  New Prescriptions   AMOXICILLIN (AMOXIL) 500 MG CAPSULE    Take 1 capsule (500 mg total) by mouth 3 (three) times daily for 7 days.     Note:  This document was prepared using Dragon voice recognition software and may include unintentional dictation errors.    Willy Eddy, MD 04/27/19 [phone]

## 2019-04-27 NOTE — Discharge Instructions (Signed)
Please follow up with dentist.  Return for worsening pain, fevers, trouble swallowing.

## 2019-04-27 NOTE — ED Triage Notes (Signed)
Patient states that she has had dental pain of the right side of her face for the past year. No previous antibiotic treatment for the dental pain. Patient states that she had her wisdom teeth removed one year ago and feels that a flap of skin left behind is the source of her pain.

## 2019-10-21 ENCOUNTER — Encounter: Payer: Self-pay | Admitting: Emergency Medicine

## 2019-10-21 ENCOUNTER — Emergency Department: Payer: Medicaid Other

## 2019-10-21 ENCOUNTER — Emergency Department
Admission: EM | Admit: 2019-10-21 | Discharge: 2019-10-21 | Disposition: A | Discharge status: Home / Self Care | Payer: Medicaid Other | Attending: Emergency Medicine | Admitting: Emergency Medicine

## 2019-10-21 ENCOUNTER — Other Ambulatory Visit: Payer: Self-pay

## 2019-10-21 DIAGNOSIS — Z79899 Other long term (current) drug therapy: Secondary | ICD-10-CM | POA: Diagnosis not present

## 2019-10-21 DIAGNOSIS — Z3202 Encounter for pregnancy test, result negative: Secondary | ICD-10-CM | POA: Diagnosis not present

## 2019-10-21 DIAGNOSIS — I10 Essential (primary) hypertension: Secondary | ICD-10-CM | POA: Insufficient documentation

## 2019-10-21 DIAGNOSIS — F17210 Nicotine dependence, cigarettes, uncomplicated: Secondary | ICD-10-CM | POA: Insufficient documentation

## 2019-10-21 DIAGNOSIS — Z91010 Allergy to peanuts: Secondary | ICD-10-CM | POA: Insufficient documentation

## 2019-10-21 LAB — CBC
HCT: 40.1 % (ref 36.0–46.0)
Hemoglobin: 13.1 g/dL (ref 12.0–15.0)
MCH: 28.9 pg (ref 26.0–34.0)
MCHC: 32.7 g/dL (ref 30.0–36.0)
MCV: 88.5 fL (ref 80.0–100.0)
Platelets: 246 10*3/uL (ref 150–400)
RBC: 4.53 MIL/uL (ref 3.87–5.11)
RDW: 13 % (ref 11.5–15.5)
WBC: 6.8 10*3/uL (ref 4.0–10.5)
nRBC: 0 % (ref 0.0–0.2)

## 2019-10-21 LAB — BASIC METABOLIC PANEL
Anion gap: 10 (ref 5–15)
BUN: 13 mg/dL (ref 6–20)
CO2: 24 mmol/L (ref 22–32)
Calcium: 9.2 mg/dL (ref 8.9–10.3)
Chloride: 103 mmol/L (ref 98–111)
Creatinine, Ser: 0.84 mg/dL (ref 0.44–1.00)
GFR calc Af Amer: >60 mL/min (ref >60)
GFR calc non Af Amer: >60 mL/min (ref >60)
Glucose, Bld: 100 mg/dL — ABNORMAL HIGH (ref 70–99)
Potassium: 3.7 mmol/L (ref 3.5–5.1)
Sodium: 137 mmol/L (ref 135–145)

## 2019-10-21 LAB — TROPONIN I (HIGH SENSITIVITY): Troponin I (High Sensitivity): 4 ng/L (ref <18)

## 2019-10-21 LAB — POCT PREGNANCY, URINE: Preg Test, Ur: NEGATIVE

## 2019-10-21 MED ORDER — HYDROCHLOROTHIAZIDE 25 MG PO TABS: 25.0000 mg | ORAL_TABLET | Freq: Every day | ORAL | 1 refills | Status: DC

## 2019-10-21 NOTE — ED Triage Notes (Addendum)
Patient from home via [facility]. Reports her HTN has been elevated x1 week. Complaining of nausea, headache, blurred vision and chest pressure. Patient reports she hasn't been taking any medication at home.

## 2019-10-21 NOTE — ED Provider Notes (Signed)
[facility] Emergency Department Provider Note   ____________________________________________   I have reviewed the triage vital signs and the nursing notes.   HISTORY  Chief Complaint Hypertension  History limited by: Not Limited   HPI [name] is a 33 y.o. female who presents to the emergency department today with complaints for high blood pressure.  She states it has been high for the past week.  She says she has history of high blood pressure and had been given a prescription at one time for blood pressure medication.  She states however that she does not have a primary care doctor.  She states this has been accompanied by some chest discomfort and headache. She says that she also feels tired.    Records reviewed. Per medical record review patient has a history of depression, HTN.   Past Medical History:  Diagnosis Date  . Depression   . History of substance abuse (HCC)   . History of suicide attempt   . History of thyroid disease   . Hypertension   . Thyroid disease     Patient Active Problem List   Diagnosis Date Noted  . Abdominal pain 03/20/2018  . Cocaine use disorder, moderate, dependence (HCC) 01/01/2018  . Cannabis use disorder, moderate, dependence (HCC) 01/01/2018  . Pregnant 12/31/2017  . Medically noncompliant 12/31/2017  . Tobacco use disorder 01/12/2016  . Stimulant use disorder (HCC) (cocaine) 01/12/2016  . Alcohol use disorder, moderate, dependence (HCC) 01/12/2016  . Self-inflicted laceration of wrist (HCC) 01/11/2016  . Major depressive disorder, recurrent severe without psychotic features (HCC) 01/11/2016    Past Surgical History:  Procedure Laterality Date  . CESAREAN SECTION  2000, 2002, 2013    Prior to Admission medications   Medication Sig Start Date End Date Taking? Authorizing Provider  albuterol (PROVENTIL HFA;VENTOLIN HFA) 108 (90 Base) MCG/ACT inhaler Inhale 2 puffs into the lungs every 6 (six)  hours as needed for wheezing or shortness of breath. Patient not taking: Reported on 03/20/2018 02/27/18   Minna AntisPaduchowski, Kevin, MD  benzonatate (TESSALON PERLES) 100 MG capsule Take 2 capsules (200 mg total) by mouth 3 (three) times daily as needed. 10/30/18 10/30/19  Joni ReiningSmith, Ronald K, PA-C  dicyclomine (BENTYL) 10 MG capsule Take 1 capsule (10 mg total) by mouth 3 (three) times daily as needed for up to 2 days for spasms. 11/21/18 11/23/18  Dionne BucySiadecki, Sebastian, MD  doxycycline (VIBRAMYCIN) 100 MG capsule Take 1 capsule (100 mg total) by mouth 2 (two) times daily. 10/14/18   Schaevitz, Myra Rudeavid Matthew, MD  fexofenadine-pseudoephedrine (ALLEGRA-D) 60-120 MG 12 hr tablet Take 1 tablet by mouth 2 (two) times daily. 10/30/18   Joni ReiningSmith, Ronald K, PA-C  folic acid (FOLVITE) 1 MG tablet Take 1 tablet (1 mg total) by mouth daily. 01/05/18   Pucilowska, Braulio ConteJolanta B, MD  hydroxyprogesterone caproate (MAKENA) 250 mg/mL OIL injection Inject 1 mL (250 mg total) into the muscle once a week. On Tuesdays 01/09/18   Pucilowska, Braulio ConteJolanta B, MD  ondansetron (ZOFRAN ODT) 8 MG disintegrating tablet Take 1 tablet (8 mg total) by mouth every 8 (eight) hours as needed for nausea or vomiting. 11/21/18   Dionne BucySiadecki, Sebastian, MD  pantoprazole (PROTONIX) 20 MG tablet Take 1 tablet (20 mg total) by mouth daily. 05/17/16 05/17/17  Jennye MoccasinQuigley, Brian S, MD  Prenatal Vit-Fe Fumarate-FA (PRENATAL MULTIVITAMIN) TABS tablet Take 1 tablet by mouth daily at 12 noon. 01/04/18   Pucilowska, Jolanta B, MD  QUEtiapine (SEROQUEL) 50 MG tablet Take 1 tablet (  50 mg total) by mouth at bedtime. Patient not taking: Reported on 03/20/2018 01/04/18   Pucilowska, Wardell Honour, MD  senna (SENOKOT) 8.6 MG TABS tablet Take 1 tablet (8.6 mg total) by mouth 2 (two) times daily at 10 AM and 5 PM. Patient not taking: Reported on 03/20/2018 01/04/18   Pucilowska, Herma Ard B, MD  sertraline (ZOLOFT) 50 MG tablet Take 1 tablet (50 mg total) by mouth at bedtime. Patient not taking: Reported  on 03/20/2018 01/04/18   Clovis Fredrickson, MD    Allergies Aspirin, Banana, Other, Peanut oil, Peanut-containing drug products, and Pecan nut (diagnostic)  Family History  Problem Relation Age of Onset  . Diabetes Mother     Social History Social History   Tobacco Use  . Smoking status: Current Every Day Smoker    Packs/day: 0.50  . Smokeless tobacco: Never Used  Substance Use Topics  . Alcohol use: Not Currently    Comment: social drinker  . Drug use: Not Currently    Types: Cocaine, Marijuana    Comment: Positive UDS 12/2017    Review of Systems Constitutional: No fever/chills Eyes: No visual changes. ENT: No sore throat. Cardiovascular: Positive for chest pain. Respiratory: Denies shortness of breath. Gastrointestinal: No abdominal pain.  No nausea, no vomiting.  No diarrhea.   Genitourinary: Negative for dysuria. Musculoskeletal: Negative for back pain. Skin: Negative for rash. Neurological: Positive for headache.  ____________________________________________   PHYSICAL EXAM:  VITAL SIGNS: ED Triage Vitals  Enc Vitals Group     BP 10/21/19 1350 (!) 171/89     Pulse Rate 10/21/19 1350 64     Resp 10/21/19 1350 20     Temp 10/21/19 1350 98.5 F (36.9 C)     Temp Source 10/21/19 1350 Oral     SpO2 10/21/19 1350 98 %     Weight 10/21/19 1351 160 lb (72.6 kg)     Height 10/21/19 1351 4\' 11"  (1.499 m)     Head Circumference --      Peak Flow --      Pain Score 10/21/19 1351 0     Pain Loc --      Pain Edu? --      Excl. in [place]? --      Constitutional: Alert and oriented.  Tearful. Eyes: Conjunctivae are normal.  ENT      Head: Normocephalic and atraumatic.      Nose: No congestion/rhinnorhea.      Mouth/Throat: Mucous membranes are moist.      Neck: No stridor. Hematological/Lymphatic/Immunilogical: No cervical lymphadenopathy. Cardiovascular: Normal rate, regular rhythm.  No murmurs, rubs, or gallops.  Respiratory: Normal respiratory effort  without tachypnea nor retractions. Breath sounds are clear and equal bilaterally. No wheezes/rales/rhonchi. Gastrointestinal: Soft and non tender. No rebound. No guarding.  Genitourinary: Deferred Musculoskeletal: Normal range of motion in all extremities. No lower extremity edema. Neurologic:  Normal speech and language. No gross focal neurologic deficits are appreciated.  Skin:  Skin is warm, dry and intact. No rash noted. Psychiatric: Tearful. Anxious. Denies any SI.   ____________________________________________    LABS (pertinent positives/negatives)  Upreg negative BMP wnl except glu 100 Trop hs 4 CBC wbc 6.8, hgb 13.1, plt 246  ____________________________________________   EKG  I, Nance Pear, attending physician, personally viewed and interpreted this EKG  EKG Time: 1348 Rate: 59 Rhythm: sinus bradycardia Axis: normal Intervals: qtc 445 QRS: narrow ST changes: no st elevation Impression: abnormal ekg ____________________________________________    RADIOLOGY  CXR Scoliosis.  No acute disease  ____________________________________________   PROCEDURES  Procedures  ____________________________________________   INITIAL IMPRESSION / ASSESSMENT AND PLAN / ED COURSE  Pertinent labs & imaging results that were available during my care of the patient were reviewed by me and considered in my medical decision making (see chart for details).   Patient presented to the emergency department today because of concerns for high blood pressure.  He did have some accompanying chest pain and headache.  Patient's work-up did not show any concerning EKG, chest x-ray or blood work findings.  On exam the patient was quite tearful.  She states that she does not feel well and that she is tired.  I did asked the patient if she has had any thoughts of self-harm which she vehemently denies.  She states she would like to go home.  At this point think that is reasonable given  negative work-up.  Patient does not appear to be at risk for self-harm at this time.  Will give patient prescription for blood pressure medication and primary care follow-up information.  ____________________________________________   FINAL CLINICAL IMPRESSION(S) / ED DIAGNOSES  Final diagnoses:  Hypertension, unspecified type     Note: This dictation was prepared with Dragon dictation. Any transcriptional errors that result from this process are unintentional     Phineas Semen, MD 10/21/19 [phone]

## 2019-10-21 NOTE — ED Notes (Signed)
Pt left treatment room prior to RN coming to room with discharge papers and prescription. RN went to lobby and found pt, RN offered prescription and to go over discharge papers. Pt refused prescription and would not allow RN to go over papers. Pt continuously turned away from nurse as she attempted to talk to her.

## 2019-10-21 NOTE — ED Notes (Signed)
Pt loudly crying in treatment room. RN heard pt from hallway and went to bedside to assess pt. Pt refusing to talk to RN and states she wants to go home. Pt willing to wait for MD but continues to be tearful and covered head with blanket.

## 2019-10-21 NOTE — Discharge Instructions (Addendum)
Please seek medical attention for any high fevers, chest pain, shortness of breath, change in behavior, persistent vomiting, bloody stool or any other new or concerning symptoms.  

## 2019-10-21 NOTE — ED Triage Notes (Signed)
First Nurse Note: Blurry vision, headache, chest pressure. Hx HTN, not currently taking meds.  176/94 for EMS

## 2019-10-27 IMAGING — CR DG CHEST 2V
1 series · 2 of 2 positions shown · non-contrast
Comparison: CT scan of the abdomen dated 11/17/2015

CLINICAL DATA: Shortness of breath.  Chest congestion and cough.

EXAM:
CHEST - 2 VIEW

[Series 1: dg chest 2 view · 0.14mm/px · 2 of 2 slices shown]
[im 1/2]
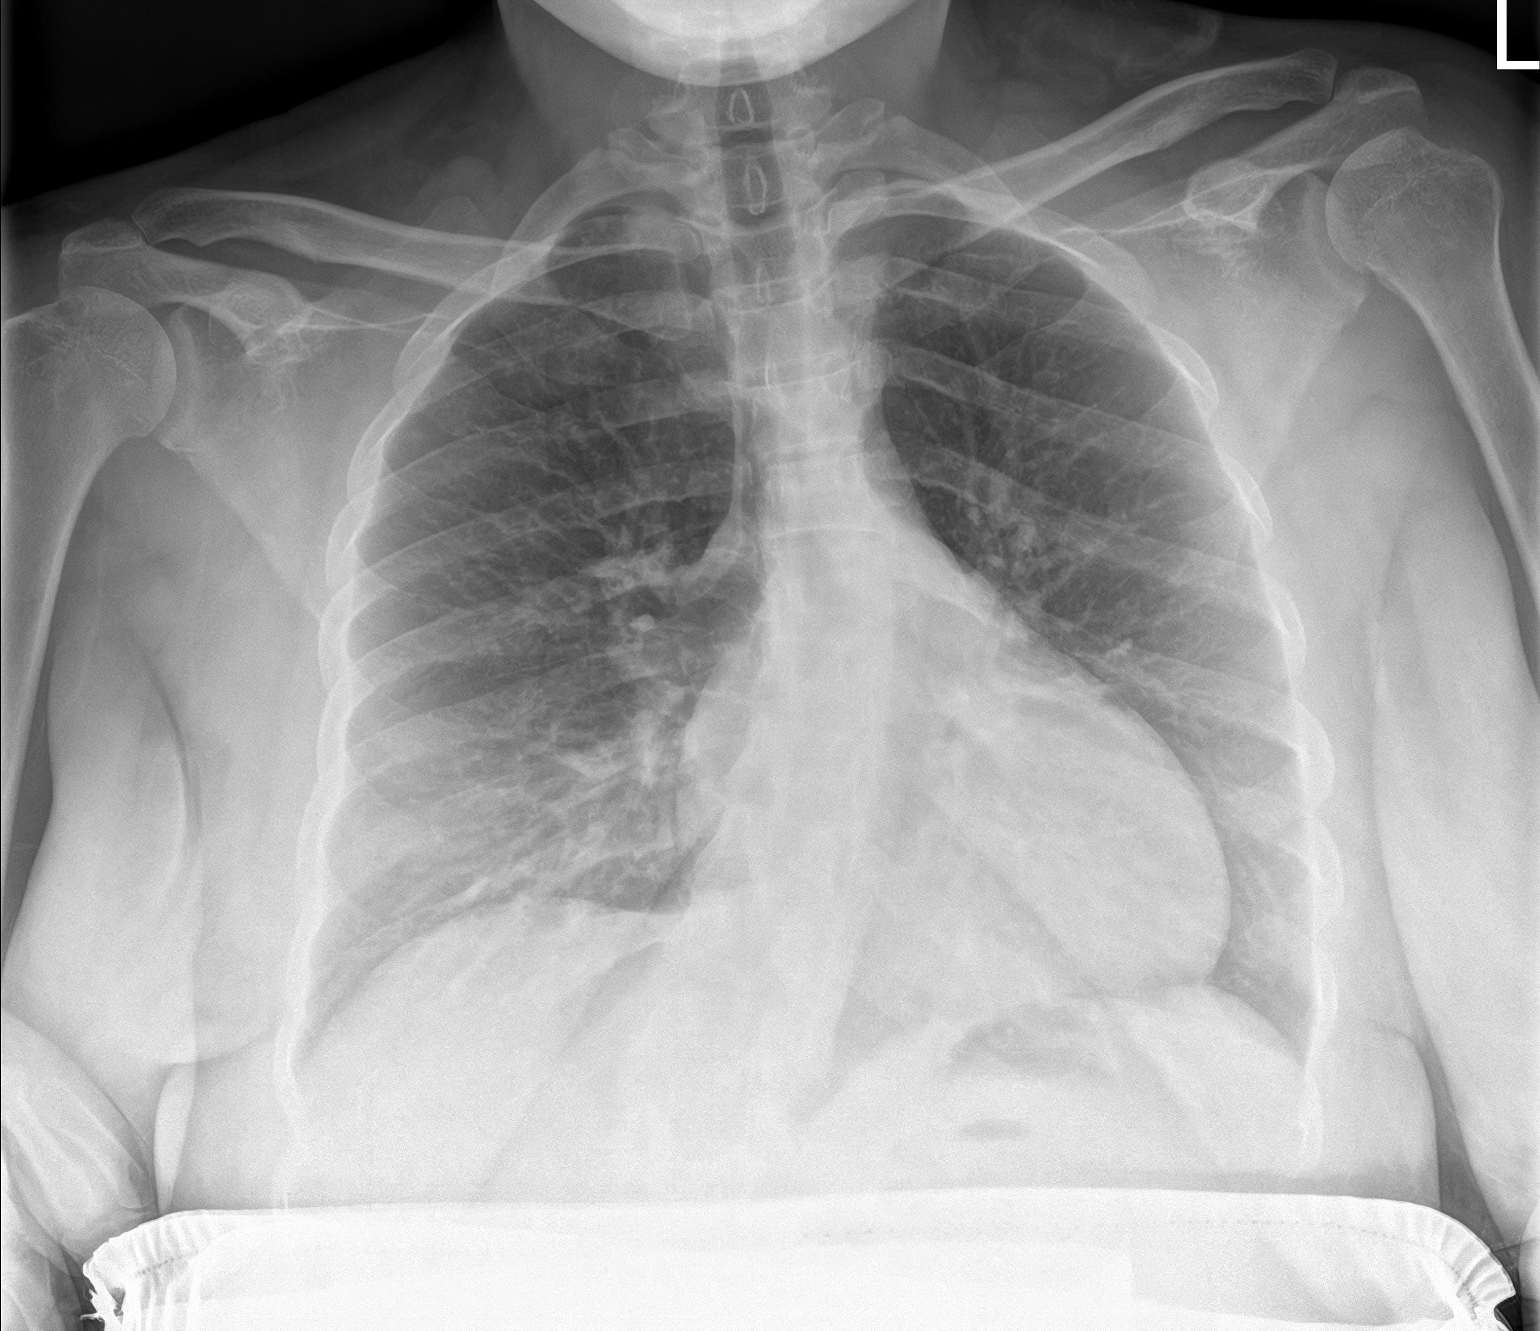
[im 2/2]
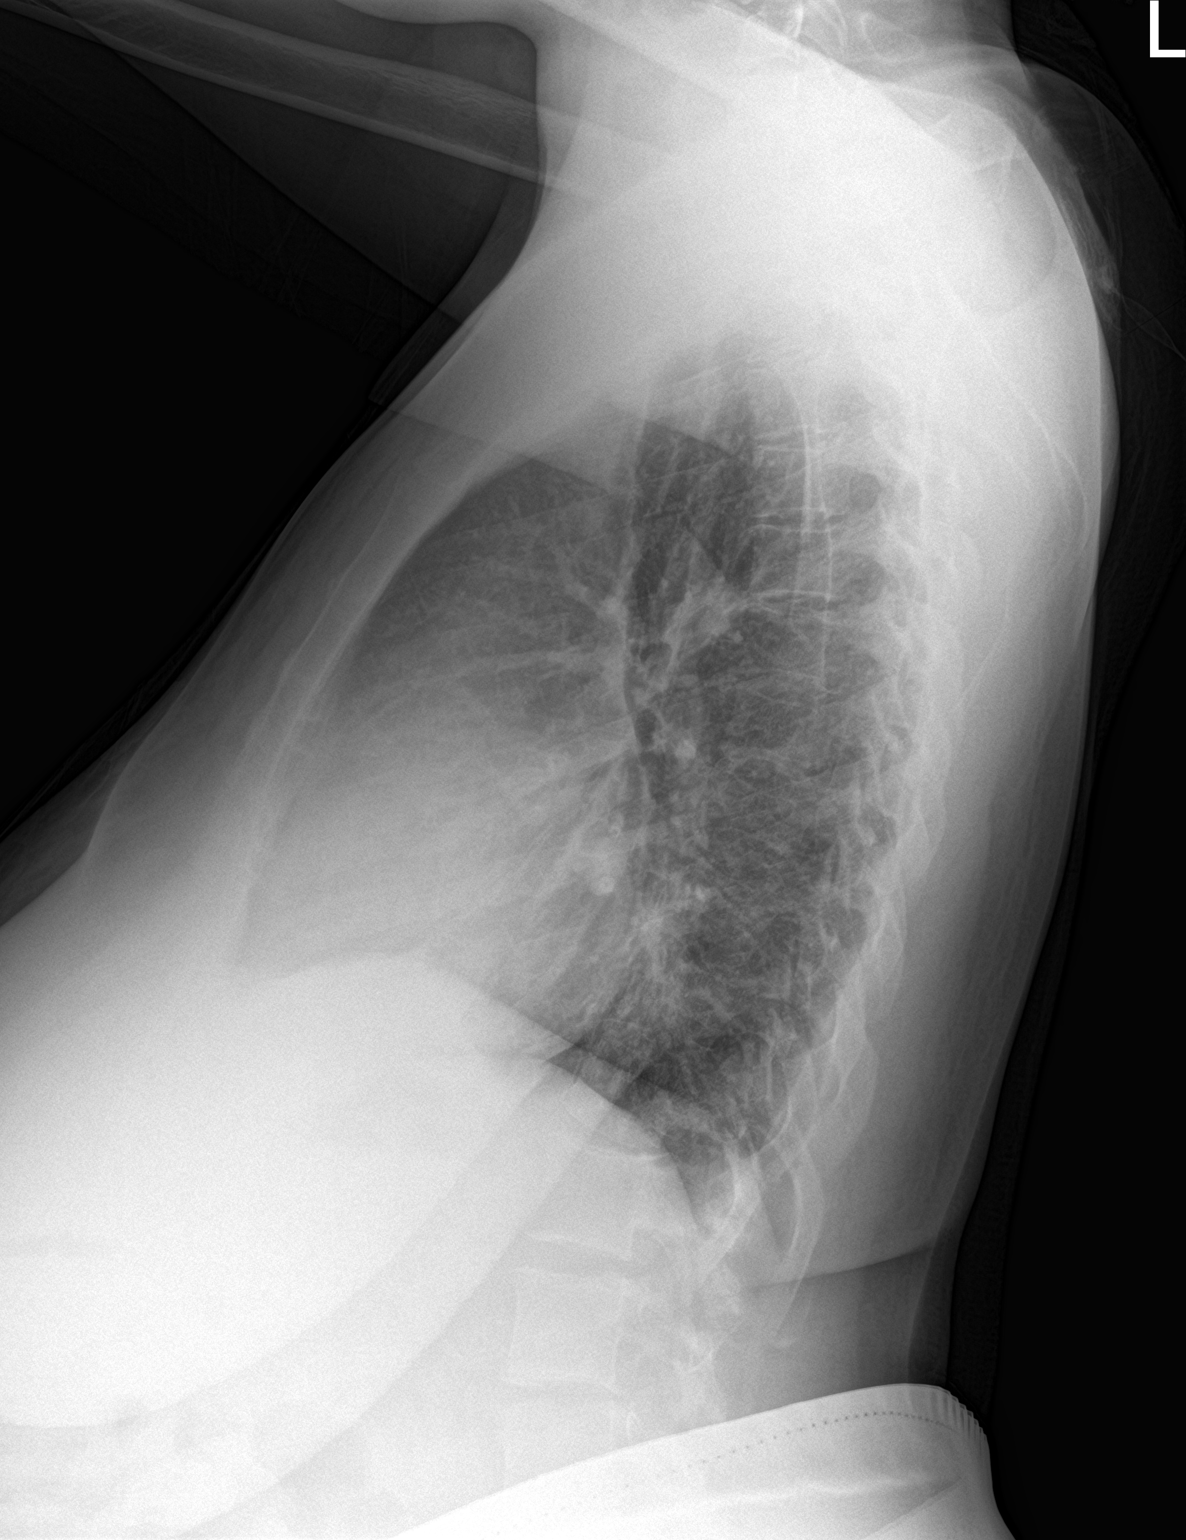

[2 of 2 positions shown; findings below may reference images not displayed]

FINDINGS: The heart size and pulmonary vascularity are normal. There is a
focal area of slight increased density at the left lung base
posterior medially, new since the prior study. The lungs are
otherwise clear. No acute bone abnormality. Thoracolumbar scoliosis.
IMPRESSION: Focal area of density at the left lung base which may represent a
small area of pneumonia.

## 2019-12-31 ENCOUNTER — Other Ambulatory Visit: Payer: Self-pay

## 2019-12-31 ENCOUNTER — Encounter: Payer: Self-pay | Admitting: Emergency Medicine

## 2019-12-31 ENCOUNTER — Emergency Department
Admission: EM | Admit: 2019-12-31 | Discharge: 2020-01-01 | Disposition: A | Discharge status: Home / Self Care | Payer: Medicaid Other | Attending: Emergency Medicine | Admitting: Emergency Medicine

## 2019-12-31 DIAGNOSIS — Z91010 Allergy to peanuts: Secondary | ICD-10-CM | POA: Diagnosis not present

## 2019-12-31 DIAGNOSIS — S61519A Laceration without foreign body of unspecified wrist, initial encounter: Secondary | ICD-10-CM | POA: Diagnosis present

## 2019-12-31 DIAGNOSIS — F32A Depression, unspecified: Secondary | ICD-10-CM

## 2019-12-31 DIAGNOSIS — F314 Bipolar disorder, current episode depressed, severe, without psychotic features: Secondary | ICD-10-CM | POA: Insufficient documentation

## 2019-12-31 DIAGNOSIS — F1420 Cocaine dependence, uncomplicated: Secondary | ICD-10-CM | POA: Diagnosis present

## 2019-12-31 DIAGNOSIS — F1220 Cannabis dependence, uncomplicated: Secondary | ICD-10-CM | POA: Diagnosis present

## 2019-12-31 DIAGNOSIS — F1590 Other stimulant use, unspecified, uncomplicated: Secondary | ICD-10-CM | POA: Diagnosis present

## 2019-12-31 DIAGNOSIS — Z79899 Other long term (current) drug therapy: Secondary | ICD-10-CM | POA: Diagnosis not present

## 2019-12-31 DIAGNOSIS — F17200 Nicotine dependence, unspecified, uncomplicated: Secondary | ICD-10-CM | POA: Diagnosis present

## 2019-12-31 DIAGNOSIS — F329 Major depressive disorder, single episode, unspecified: Secondary | ICD-10-CM | POA: Diagnosis present

## 2019-12-31 DIAGNOSIS — I10 Essential (primary) hypertension: Secondary | ICD-10-CM | POA: Diagnosis not present

## 2019-12-31 DIAGNOSIS — F1020 Alcohol dependence, uncomplicated: Secondary | ICD-10-CM | POA: Diagnosis present

## 2019-12-31 LAB — COMPREHENSIVE METABOLIC PANEL
ALT: 11 U/L (ref 0–44)
AST: 12 U/L — ABNORMAL LOW (ref 15–41)
Albumin: 4.3 g/dL (ref 3.5–5.0)
Alkaline Phosphatase: 92 U/L (ref 38–126)
Anion gap: 10 (ref 5–15)
BUN: 12 mg/dL (ref 6–20)
CO2: 22 mmol/L (ref 22–32)
Calcium: 8.9 mg/dL (ref 8.9–10.3)
Chloride: 105 mmol/L (ref 98–111)
Creatinine, Ser: 0.73 mg/dL (ref 0.44–1.00)
GFR calc Af Amer: >60 mL/min (ref >60)
GFR calc non Af Amer: >60 mL/min (ref >60)
Glucose, Bld: 131 mg/dL — ABNORMAL HIGH (ref 70–99)
Potassium: 3.5 mmol/L (ref 3.5–5.1)
Sodium: 137 mmol/L (ref 135–145)
Total Bilirubin: 0.6 mg/dL (ref 0.3–1.2)
Total Protein: 7.7 g/dL (ref 6.5–8.1)

## 2019-12-31 LAB — CBC
HCT: 39.3 % (ref 36.0–46.0)
Hemoglobin: 13 g/dL (ref 12.0–15.0)
MCH: 29.1 pg (ref 26.0–34.0)
MCHC: 33.1 g/dL (ref 30.0–36.0)
MCV: 88.1 fL (ref 80.0–100.0)
Platelets: 194 10*3/uL (ref 150–400)
RBC: 4.46 MIL/uL (ref 3.87–5.11)
RDW: 13.1 % (ref 11.5–15.5)
WBC: 9.2 10*3/uL (ref 4.0–10.5)
nRBC: 0 % (ref 0.0–0.2)

## 2019-12-31 LAB — T4, FREE: Free T4: 0.74 ng/dL (ref 0.61–1.12)

## 2019-12-31 LAB — TSH: TSH: 1.46 u[IU]/mL (ref 0.350–4.500)

## 2019-12-31 NOTE — ED Notes (Addendum)
Pt asking for food tray.  Brought a sprite and sandwich tray to her.  She gave it back angrily stating she cant eat it because there is a banana in there and she is allergic to bananas, and the banana is touching the sandwich.  Offered her crackers and peanut butter and she states "I dont eat crackers". I checked all trays and all of them had bananas in them.  I told her that's all we had and she refused her drink (Ileft it in her room for her) and covered her head with a blanket.

## 2019-12-31 NOTE — ED Notes (Signed)
Pt. Talking to TTS and NP in room.

## 2019-12-31 NOTE — ED Provider Notes (Addendum)
[facility] Emergency Department Provider Note  ____________________________________________   First MD Initiated Contact with Patient 12/31/19 2206     (approximate)  I have reviewed the triage vital signs and the nursing notes.   HISTORY  Chief Complaint Depression    HPI [name] is a 34 y.o. female with prior substance abuse, thyroid disease, depression who comes in with worsening depression.  Patient is here voluntary.  Patient states she has had depression for 2 years since she gave birth.  She initially had to go inpatient for some time.  During this time she was on some medication which she is not sure what it was but it seemed to help her symptoms.  Upon discharge she was put on Zoloft and she said that this had not been helping.  She has a psychiatrist reportedly last seen a few months ago but states that they not made any adjustments and she is currently not taking the Zoloft.  She states that she had thoughts of hurting herself 2 or 3 months ago where she used a knife but it made no indentations on her left wrist.  She states that now her plan would be to jump out of a window but she does not think that she would do that because she would not want to break her ankles.  Her depression is severe, constant, nothing makes it better, nothing makes it worse.  She is not on any thyroid medication but denies any weight loss.          Past Medical History:  Diagnosis Date  . Depression   . History of substance abuse (HCC)   . History of suicide attempt   . History of thyroid disease   . Hypertension   . Thyroid disease     Patient Active Problem List   Diagnosis Date Noted  . Abdominal pain 03/20/2018  . Cocaine use disorder, moderate, dependence (HCC) 01/01/2018  . Cannabis use disorder, moderate, dependence (HCC) 01/01/2018  . Pregnant 12/31/2017  . Medically noncompliant 12/31/2017  . Tobacco use disorder 01/12/2016  . Stimulant use  disorder (HCC) (cocaine) 01/12/2016  . Alcohol use disorder, moderate, dependence (HCC) 01/12/2016  . Self-inflicted laceration of wrist (HCC) 01/11/2016  . Major depressive disorder, recurrent severe without psychotic features (HCC) 01/11/2016    Past Surgical History:  Procedure Laterality Date  . CESAREAN SECTION  2000, 2002, 2013    Prior to Admission medications   Medication Sig Start Date End Date Taking? Authorizing Provider  albuterol (PROVENTIL HFA;VENTOLIN HFA) 108 (90 Base) MCG/ACT inhaler Inhale 2 puffs into the lungs every 6 (six) hours as needed for wheezing or shortness of breath. Patient not taking: Reported on 03/20/2018 02/27/18   Minna Antis, MD  dicyclomine (BENTYL) 10 MG capsule Take 1 capsule (10 mg total) by mouth 3 (three) times daily as needed for up to 2 days for spasms. 11/21/18 11/23/18  Dionne Bucy, MD  doxycycline (VIBRAMYCIN) 100 MG capsule Take 1 capsule (100 mg total) by mouth 2 (two) times daily. 10/14/18   Schaevitz, Myra Rude, MD  fexofenadine-pseudoephedrine (ALLEGRA-D) 60-120 MG 12 hr tablet Take 1 tablet by mouth 2 (two) times daily. 10/30/18   Joni Reining, PA-C  folic acid (FOLVITE) 1 MG tablet Take 1 tablet (1 mg total) by mouth daily. 01/05/18   Pucilowska, Jolanta B, MD  hydrochlorothiazide (HYDRODIURIL) 25 MG tablet Take 1 tablet (25 mg total) by mouth daily. 10/21/19   Phineas Semen, MD  hydroxyprogesterone  caproate (MAKENA) 250 mg/mL OIL injection Inject 1 mL (250 mg total) into the muscle once a week. On Tuesdays 01/09/18   Pucilowska, Braulio Conte B, MD  ondansetron (ZOFRAN ODT) 8 MG disintegrating tablet Take 1 tablet (8 mg total) by mouth every 8 (eight) hours as needed for nausea or vomiting. 11/21/18   Dionne Bucy, MD  pantoprazole (PROTONIX) 20 MG tablet Take 1 tablet (20 mg total) by mouth daily. 05/17/16 05/17/17  Jennye Moccasin, MD  Prenatal Vit-Fe Fumarate-FA (PRENATAL MULTIVITAMIN) TABS tablet Take 1 tablet by  mouth daily at 12 noon. 01/04/18   Pucilowska, Braulio Conte B, MD  QUEtiapine (SEROQUEL) 50 MG tablet Take 1 tablet (50 mg total) by mouth at bedtime. Patient not taking: Reported on 03/20/2018 01/04/18   Pucilowska, Ellin Goodie, MD  senna (SENOKOT) 8.6 MG TABS tablet Take 1 tablet (8.6 mg total) by mouth 2 (two) times daily at 10 AM and 5 PM. Patient not taking: Reported on 03/20/2018 01/04/18   Pucilowska, Braulio Conte B, MD  sertraline (ZOLOFT) 50 MG tablet Take 1 tablet (50 mg total) by mouth at bedtime. Patient not taking: Reported on 03/20/2018 01/04/18   Shari Prows, MD    Allergies Aspirin, Banana, Other, Peanut oil, Peanut-containing drug products, and Pecan nut (diagnostic)  Family History  Problem Relation Age of Onset  . Diabetes Mother     Social History Social History   Tobacco Use  . Smoking status: Current Every Day Smoker    Packs/day: 0.50  . Smokeless tobacco: Never Used  Substance Use Topics  . Alcohol use: Not Currently    Comment: social drinker  . Drug use: Not Currently    Types: Cocaine, Marijuana    Comment: Positive UDS 12/2017      Review of Systems Constitutional: No fever/chills Eyes: No visual changes. ENT: No sore throat. Cardiovascular: Denies chest pain. Respiratory: Denies shortness of breath. Gastrointestinal: No abdominal pain.  No nausea, no vomiting.  No diarrhea.  No constipation. Genitourinary: Negative for dysuria. Musculoskeletal: Negative for back pain. Skin: Negative for rash. Neurological: Negative for headaches, focal weakness or numbness. Psych: depression  All other ROS negative ____________________________________________   PHYSICAL EXAM:  VITAL SIGNS: ED Triage Vitals  Enc Vitals Group     BP 12/31/19 2130 (!) 162/75     Pulse Rate 12/31/19 2130 65     Resp 12/31/19 2130 20     Temp 12/31/19 2130 97.7 F (36.5 C)     Temp Source 12/31/19 2130 Oral     SpO2 12/31/19 2130 100 %     Weight 12/31/19 1915 160 lb (72.6  kg)     Height 12/31/19 1915 4\' 11"  (1.499 m)     Head Circumference --      Peak Flow --      Pain Score 12/31/19 1915 0     Pain Loc --      Pain Edu? --      Excl. in [place]? --     Constitutional: Alert and oriented. Well appearing and in no acute distress. Eyes: Conjunctivae are normal. EOMI. Head: Atraumatic. Nose: No congestion/rhinnorhea. Mouth/Throat: Mucous membranes are moist.   Neck: No stridor. Trachea Midline. FROM Cardiovascular: Normal rate, regular rhythm. Grossly normal heart sounds.  Good peripheral circulation. Respiratory: Normal respiratory effort.  No retractions. Lungs CTAB. Gastrointestinal: Soft and nontender. No distention. No abdominal bruits.  Musculoskeletal: No lower extremity tenderness nor edema.  No joint effusions. Neurologic:  Normal speech and language. No gross focal  neurologic deficits are appreciated.  Skin:  Skin is warm, dry and intact. No rash noted. Psychiatric: + depression, SI  GU: Deferred   ____________________________________________   LABS (all labs ordered are listed, but only abnormal results are displayed)  Labs Reviewed  COMPREHENSIVE METABOLIC PANEL - Abnormal; Notable for the following components:      Result Value   Glucose, Bld 131 (*)    AST 12 (*)    All other components within normal limits  CBC  URINE DRUG SCREEN, QUALITATIVE ([facility] ONLY)  TSH  T4, FREE  POC URINE PREG, ED   ____________________________________________   INITIAL IMPRESSION / ASSESSMENT AND PLAN / ED COURSE  [name] was evaluated in Emergency Department on 12/31/2019 for the symptoms described in the history of present illness. She was evaluated in the context of the global COVID-19 pandemic, which necessitated consideration that the patient might be at risk for infection with the SARS-CoV-2 virus that causes COVID-19. Institutional protocols and algorithms that pertain to the evaluation of patients at risk for COVID-19 are in a state of  rapid change based on information released by regulatory bodies including the CDC and federal and state organizations. These policies and algorithms were followed during the patient's care in the ED.    Pt is without any acute medical complaints. No exam findings to suggest medical cause of current presentation. Will order psychiatric screening labs and discuss further w/ psychiatric service.  Patient was initially voluntary but she did have SI stating that she wanted to jump out a window but was slightly hesitant due to breaking her ankles.  Given these active thoughts of suicide with a plan patient may need to be IVC to.  Patient did have an episode of extreme anger when she was given a food tray that had a banana sitting next the sandwich.  Patient states that she is allergic to bananas and that she cannot eat the sandwich.  Patient was screaming and throwing water.  Verbally deescalated patient given these emotional outburst as well as the above I feel that patient will need to be IVC for psychiatric evaluation.  D/d includes but is not limited to psychiatric disease, behavioral/personality disorder, inadequate socioeconomic support, medical.  Based on HPI, exam, unremarkable labs, no concern for acute medical problem at this time. No rigidity, clonus, hyperthermia, focal neurologic deficit, diaphoresis, tachycardia, meningismus, ataxia, gait abnormality or other finding to suggest this visit represents a non-psychiatric problem. Screening labs reviewed.    Given this, pt medically cleared, to be dispositioned per Psych.         ____________________________________________   FINAL CLINICAL IMPRESSION(S) / ED DIAGNOSES   Final diagnoses:  Depression, unspecified depression type      MEDICATIONS GIVEN DURING THIS VISIT:  Medications - No data to display   ED Discharge Orders    None       Note:  This document was prepared using Dragon voice recognition software and may  include unintentional dictation errors.   Vanessa [place], MD 12/31/19 2239    Vanessa Simms, MD 12/31/19 [phone]

## 2019-12-31 NOTE — ED Notes (Signed)
Pt throws drink at the door and flipped the linen cart in her room.  States she is hungry and "too bad because its only wet".  Pt screaming and yelling profanities at staff.

## 2019-12-31 NOTE — ED Notes (Signed)
Pt unable to give urine sample at this time; pt has specimen cup. 

## 2019-12-31 NOTE — ED Triage Notes (Signed)
Pt in via [facility] voluntarily with c/o severe depression. Pt denies SI.

## 2020-01-01 MED ORDER — ACETAMINOPHEN 325 MG PO TABS
ORAL_TABLET | ORAL | Status: AC
  Administered 2020-01-01: 13:00:00 650 mg via ORAL
  Filled 2020-01-01: qty 2

## 2020-01-01 MED ORDER — ACETAMINOPHEN 325 MG PO TABS: 650.0000 mg | ORAL_TABLET | Freq: Once | ORAL | Status: AC

## 2020-01-01 NOTE — Consult Note (Signed)
  Patient reassessed this morning.  Upon reevaluation patient was calm and cooperative.  She stated that she was just upset yesterday and that she is not feeling suicidal.  She denies any other psychiatric symptoms.  She reports feeling safe going home and is willing to accept resources for outpatient treatment.  Patient to be discharged

## 2020-01-01 NOTE — ED Notes (Signed)
Pt. Here for depression with SI thoughts.  Pt. Came in voluntary, but became IVC due to expressing thoughts of SI.
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
## 2020-01-01 NOTE — ED Notes (Signed)
Per psychiatrist pt willing to go home at this time. Will need bus voucher to go home.

## 2020-01-01 NOTE — BH Assessment (Signed)
Writer spoke with patient to complete updated/ reassessment. Patient denies SI/HI and AV/H. Patient states she came to the ER to get a break.
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
## 2020-01-01 NOTE — BH Assessment (Signed)
Referral information for Psychiatric Hospitalization faxed to;   [facility] [phone]),   . [facility] [phone]),   . [facility] [phone] -or- [phone]),    . [facility] [phone] or [phone])  . [facility] [phone])

## 2020-01-01 NOTE — ED Notes (Addendum)
Pt to door to speak to this RN. Pt states to this RN that she does not want to be discharged. TTS notified to speak with psychiatry.   Dr. Otelia Santee notified that patient does not want to be discharged.

## 2020-01-01 NOTE — ED Provider Notes (Signed)
Patient seen and cleared by Psychiatry. Discharge home.   Shaune Pollack, MD 01/01/20 1059

## 2020-01-01 NOTE — Consult Note (Signed)
  Writer called again to bedside as patient was hesitant about being discharged even though plan had been for patient to be discharged when reevaluated this morning.  After speaking to patient briefly for approximately 10 minutes patient expressed her social concerns including taking care of her 2 kids and not having a cell phone or way to get home.  Patient was alleviated when offered a bus pass and outpatient resources as well as instructed to return to the ER if unable to obtain relief on an outpatient basis.  Seems like patient has a lot of social issues and would benefit from calm redirection.  There is no current reason to justify admission at this time.  Patient to be discharged

## 2020-01-01 NOTE — ED Notes (Signed)
Pt states she does not have a ride home, states there is nobody to come and pick her up.

## 2020-01-01 NOTE — ED Notes (Signed)
Psychiatrist at bedside at this time to speak with patient to re-evaluate.

## 2020-01-01 NOTE — ED Notes (Signed)
Pt gave permission for this RN to attempt to call Essie Hart (father) listed in chart, this RN attempted to call to obtain ride for patient. No answer.

## 2020-01-01 NOTE — Consult Note (Signed)
[facility] Face-to-Face Psychiatry Consult   Reason for Consult: Depression Referring Physician: Dr. Fuller Plan Patient Identification: [name] MRN:  [id number] Principal Diagnosis: <principal problem not specified> Diagnosis:  Active Problems:   Self-inflicted laceration of wrist (HCC)   Major depressive disorder, recurrent severe without psychotic features (HCC)   Tobacco use disorder   Stimulant use disorder (HCC) (cocaine)   Alcohol use disorder, moderate, dependence (HCC)   Cocaine use disorder, moderate, dependence (HCC)   Cannabis use disorder, moderate, dependence (HCC)   Total Time spent with patient: 45 minutes  Subjective: "I have bad depression and bad anxiety." [name] is a 34 y.o. female patient presented to [facility] ED via law enforcement voluntary. The patient was placed under IVC'd by the EDP for throwing a banana and being verbally aggressive towards staff.  The patient voice she has been experiencing severe depression.  The patient was seen face-to-face by this provider; chart reviewed and consulted with Dr. Fuller Plan on 12/31/2019 due to the patient's care. It was discussed with the EDP that the patient does meet criteria to be admitted to the psychiatric inpatient unit.  The patient is alert and oriented x 4, angry, partially cooperative, and mood-congruent with affect on evaluation.  The patient does not appear to be responding to internal or external stimuli.  She is the patient presenting with delusional thinking. The patient denies auditory or visual hallucinations. The patient admits to suicidal ideation without a plan but denies homicidal or self-harm ideations. The patient is not presenting with any psychotic or paranoid behaviors. During an encounter with the patient, she was able to answer questions appropriately.  Plan: The patient is a safety risk to self and does require psychiatric inpatient admission for stabilization and treatment.  HPI: Per Dr. Fuller Plan;  [name] is a 34 y.o. female with prior substance abuse, thyroid disease, depression who comes in with worsening depression.  Patient is here voluntary.  Patient states she has had depression for 2 years since she gave birth.  She initially had to go inpatient for some time.  During this time she was on some medication which she is not sure what it was but it seemed to help her symptoms.  Upon discharge she was put on Zoloft and she said that this had not been helping.  She has a psychiatrist reportedly last seen a few months ago but states that they not made any adjustments and she is currently not taking the Zoloft.  She states that she had thoughts of hurting herself 2 or 3 months ago where she used a knife but it made no indentations on her left wrist.  She states that now her plan would be to jump out of a window but she does not think that she would do that because she would not want to break her ankles.  Her depression is severe, constant, nothing makes it better, nothing makes it worse.  She is not on any thyroid medication but denies any weight loss.  Past Psychiatric History:  Depression History of substance abuse (HCC) History of suicide attempt  Risk to Self: Suicidal Ideation: Yes-Currently Present Suicidal Intent: No-Not Currently/Within Last 6 Months Is patient at risk for suicide?: Yes Suicidal Plan?: No Access to Means: No What has been your use of drugs/alcohol within the last 12 months?: Reports substance abuse Triggers for Past Attempts: None known Intentional Self Injurious Behavior: None Risk to Others: Homicidal Ideation: No Thoughts of Harm to Others: No Current Homicidal Intent:  No Current Homicidal Plan: No Access to Homicidal Means: No History of harm to others?: No Assessment of Violence: None Noted Does patient have access to weapons?: No Criminal Charges Pending?: No Does patient have a court date: No Prior Inpatient Therapy: Prior Inpatient Therapy:  No Prior Outpatient Therapy: Prior Outpatient Therapy: Yes Prior Therapy Dates: Currently Prior Therapy Facilty/Provider(s): [facility] Reason for Treatment: Depression Does patient have an ACCT team?: No Does patient have Intensive In-House Services?  : No Does patient have [facility] services? : No Does patient have P4CC services?: No  Past Medical History:  Past Medical History:  Diagnosis Date  . Depression   . History of substance abuse ([place])   . History of suicide attempt   . History of thyroid disease   . Hypertension   . Thyroid disease     Past Surgical History:  Procedure Laterality Date  . CESAREAN SECTION  2000, 2002, 2013   Family History:  Family History  Problem Relation Age of Onset  . Diabetes Mother    Family Psychiatric  History:  Social History:  Social History   Substance and Sexual Activity  Alcohol Use Not Currently   Comment: social drinker     Social History   Substance and Sexual Activity  Drug Use Not Currently  . Types: Cocaine, Marijuana   Comment: Positive UDS 12/2017    Social History   Socioeconomic History  . Marital status: Single    Spouse name: Not on file  . Number of children: Not on file  . Years of education: Not on file  . Highest education level: Not on file  Occupational History  . Not on file  Tobacco Use  . Smoking status: Current Every Day Smoker    Packs/day: 0.50  . Smokeless tobacco: Never Used  Substance and Sexual Activity  . Alcohol use: Not Currently    Comment: social drinker  . Drug use: Not Currently    Types: Cocaine, Marijuana    Comment: Positive UDS 12/2017  . Sexual activity: Not Currently  Other Topics Concern  . Not on file  Social History Narrative  . Not on file   Social Determinants of Health   Financial Resource Strain:   . Difficulty of Paying Living Expenses: Not on file  Food Insecurity:   . Worried About Charity fundraiser in the Last Year: Not on file  . Ran Out of  Food in the Last Year: Not on file  Transportation Needs:   . Lack of Transportation (Medical): Not on file  . Lack of Transportation (Non-Medical): Not on file  Physical Activity:   . Days of Exercise per Week: Not on file  . Minutes of Exercise per Session: Not on file  Stress:   . Feeling of Stress : Not on file  Social Connections:   . Frequency of Communication with Friends and Family: Not on file  . Frequency of Social Gatherings with Friends and Family: Not on file  . Attends Religious Services: Not on file  . Active Member of Clubs or Organizations: Not on file  . Attends Archivist Meetings: Not on file  . Marital Status: Not on file   Additional Social History:    Allergies:   Allergies  Allergen Reactions  . Aspirin Anaphylaxis, Diarrhea and Nausea And Vomiting  . Banana Anaphylaxis, Itching, Shortness Of Breath and Swelling  . Other Itching, Shortness Of Breath and Swelling  . Peanut Oil Itching, Shortness Of Breath and  Swelling  . Peanut-Containing Drug Products Shortness Of Breath, Itching and Swelling  . Pecan Nut (Diagnostic) Anaphylaxis    Labs:  Results for orders placed or performed during the hospital encounter of 12/31/19 (from the past 48 hour(s))  Comprehensive metabolic panel     Status: Abnormal   Collection Time: 12/31/19  9:43 PM  Result Value Ref Range   Sodium 137 135 - 145 mmol/L   Potassium 3.5 3.5 - 5.1 mmol/L   Chloride 105 98 - 111 mmol/L   CO2 22 22 - 32 mmol/L   Glucose, Bld 131 (H) 70 - 99 mg/dL   BUN 12 6 - 20 mg/dL   Creatinine, Ser 0.08 0.44 - 1.00 mg/dL   Calcium 8.9 8.9 - 67.6 mg/dL   Total Protein 7.7 6.5 - 8.1 g/dL   Albumin 4.3 3.5 - 5.0 g/dL   AST 12 (L) 15 - 41 U/L   ALT 11 0 - 44 U/L   Alkaline Phosphatase 92 38 - 126 U/L   Total Bilirubin 0.6 0.3 - 1.2 mg/dL   GFR calc non Af Amer >60 >60 mL/min   GFR calc Af Amer >60 >60 mL/min   Anion gap 10 5 - 15    Comment: Performed at [facility], [address] [place], [place] [address]  cbc     Status: None   Collection Time: 12/31/19  9:43 PM  Result Value Ref Range   WBC 9.2 4.0 - 10.5 K/uL   RBC 4.46 3.87 - 5.11 MIL/uL   Hemoglobin 13.0 12.0 - 15.0 g/dL   HCT 32.6 71.2 - 45.8 %   MCV 88.1 80.0 - 100.0 fL   MCH 29.1 26.0 - 34.0 pg   MCHC 33.1 30.0 - 36.0 g/dL   RDW 09.9 83.3 - 82.5 %   Platelets 194 150 - 400 K/uL   nRBC 0.0 0.0 - 0.2 %    Comment: Performed at [facility], [address] [place], [place] [address]  TSH     Status: None   Collection Time: 12/31/19  9:43 PM  Result Value Ref Range   TSH 1.460 0.350 - 4.500 uIU/mL    Comment: Performed by a 3rd Generation assay with a functional sensitivity of <=0.01 uIU/mL. Performed at [facility], [address] [place], [place] [address]   T4, free     Status: None   Collection Time: 12/31/19  9:43 PM  Result Value Ref Range   Free T4 0.74 0.61 - 1.12 ng/dL    Comment: (NOTE) Biotin ingestion may interfere with free T4 tests. If the results are inconsistent with the TSH level, previous test results, or the clinical presentation, then consider biotin interference. If needed, order repeat testing after stopping biotin. Performed at [facility], [address] [place], [place] [address]     No current facility-administered medications for this encounter.   Current Outpatient Medications  Medication Sig Dispense Refill  . albuterol (PROVENTIL HFA;VENTOLIN HFA) 108 (90 Base) MCG/ACT inhaler Inhale 2 puffs into the lungs every 6 (six) hours as needed for wheezing or shortness of breath. (Patient not taking: Reported on 03/20/2018) 1 Inhaler 0  . dicyclomine (BENTYL) 10 MG capsule Take 1 capsule (10 mg total) by mouth 3 (three) times daily as needed for up to 2 days for spasms. 6 capsule 0  . doxycycline (VIBRAMYCIN) 100 MG capsule Take 1 capsule (100 mg total) by mouth 2 (two) times daily. (Patient not taking: Reported on  12/31/2019) 28  capsule 0  . fexofenadine-pseudoephedrine (ALLEGRA-D) 60-120 MG 12 hr tablet Take 1 tablet by mouth 2 (two) times daily. (Patient not taking: Reported on 12/31/2019) 20 tablet 0  . folic acid (FOLVITE) 1 MG tablet Take 1 tablet (1 mg total) by mouth daily. (Patient not taking: Reported on 12/31/2019) 30 tablet 1  . hydrochlorothiazide (HYDRODIURIL) 25 MG tablet Take 1 tablet (25 mg total) by mouth daily. (Patient not taking: Reported on 12/31/2019) 30 tablet 1  . hydroxyprogesterone caproate (MAKENA) 250 mg/mL OIL injection Inject 1 mL (250 mg total) into the muscle once a week. On Tuesdays (Patient not taking: Reported on 12/31/2019) 0.98 mL 0  . ondansetron (ZOFRAN ODT) 8 MG disintegrating tablet Take 1 tablet (8 mg total) by mouth every 8 (eight) hours as needed for nausea or vomiting. (Patient not taking: Reported on 12/31/2019) 5 tablet 0  . pantoprazole (PROTONIX) 20 MG tablet Take 1 tablet (20 mg total) by mouth daily. 30 tablet 1  . Prenatal Vit-Fe Fumarate-FA (PRENATAL MULTIVITAMIN) TABS tablet Take 1 tablet by mouth daily at 12 noon. (Patient not taking: Reported on 12/31/2019) 30 tablet 1  . QUEtiapine (SEROQUEL) 50 MG tablet Take 1 tablet (50 mg total) by mouth at bedtime. (Patient not taking: Reported on 03/20/2018) 30 tablet 1  . senna (SENOKOT) 8.6 MG TABS tablet Take 1 tablet (8.6 mg total) by mouth 2 (two) times daily at 10 AM and 5 PM. (Patient not taking: Reported on 03/20/2018) 60 each 1  . sertraline (ZOLOFT) 50 MG tablet Take 1 tablet (50 mg total) by mouth at bedtime. (Patient not taking: Reported on 03/20/2018) 30 tablet 1    Musculoskeletal: Strength & Muscle Tone: within normal limits Gait & Station: normal Patient leans: N/A  Psychiatric Specialty Exam: Physical Exam  Nursing note and vitals reviewed. Constitutional: She is oriented to person, place, and time. She appears well-developed and well-nourished.  Respiratory: Effort normal.  Musculoskeletal:        General: Normal range  of motion.     Cervical back: Normal range of motion and neck supple.  Neurological: She is alert and oriented to person, place, and time.    Review of Systems  Psychiatric/Behavioral: Positive for agitation and behavioral problems. The patient is nervous/anxious.   All other systems reviewed and are negative.   Blood pressure (!) 162/75, pulse 65, temperature 97.7 F (36.5 C), temperature source Oral, resp. rate 20, height 4\' 11"  (1.499 m), weight 72.6 kg, last menstrual period 11/26/2019, SpO2 100 %.Body mass index is 32.32 kg/m.  General Appearance: Disheveled  Eye Contact:  Minimal  Speech:  Clear and Coherent  Volume:  Decreased  Mood:  Angry, Anxious and Depressed  Affect:  Non-Congruent, Depressed and Inappropriate  Thought Process:  Coherent  Orientation:  Full (Time, Place, and Person)  Thought Content:  Logical  Suicidal Thoughts:  Yes.  with intent/plan  Homicidal Thoughts:  No  Memory:  Immediate;   Good Recent;   Good Remote;   Good  Judgement:  Poor  Insight:  Lacking  Psychomotor Activity:  Normal  Concentration:  Concentration: Poor and Attention Span: Poor  Recall:  [employer]  Fund of Knowledge:  Fair  Language:  Good  Akathisia:  Negative  Handed:  Right  AIMS (if indicated):     Assets:  Desire for Improvement Social Support  ADL's:  Intact  Cognition:  WNL  Sleep:    Insomnia     Treatment Plan Summary: Plan Patient meets  criteria for psychiatric inpatient admission.  Disposition: Recommend psychiatric Inpatient admission when medically cleared. Supportive therapy provided about ongoing stressors.  Gillermo Murdoch, NP 01/01/2020 2:35 AM

## 2020-02-01 ENCOUNTER — Emergency Department
Admission: EM | Admit: 2020-02-01 | Discharge: 2020-02-02 | Disposition: A | Discharge status: Other Acute Inpatient Hospital | Payer: Medicaid Other | Attending: Student | Admitting: Student

## 2020-02-01 ENCOUNTER — Other Ambulatory Visit: Payer: Self-pay

## 2020-02-01 DIAGNOSIS — F329 Major depressive disorder, single episode, unspecified: Secondary | ICD-10-CM | POA: Diagnosis present

## 2020-02-01 DIAGNOSIS — R45851 Suicidal ideations: Secondary | ICD-10-CM | POA: Diagnosis not present

## 2020-02-01 DIAGNOSIS — Z3201 Encounter for pregnancy test, result positive: Secondary | ICD-10-CM

## 2020-02-01 DIAGNOSIS — I10 Essential (primary) hypertension: Secondary | ICD-10-CM | POA: Diagnosis not present

## 2020-02-01 DIAGNOSIS — O26891 Other specified pregnancy related conditions, first trimester: Secondary | ICD-10-CM | POA: Diagnosis present

## 2020-02-01 DIAGNOSIS — F17210 Nicotine dependence, cigarettes, uncomplicated: Secondary | ICD-10-CM | POA: Diagnosis not present

## 2020-02-01 DIAGNOSIS — Z3A01 Less than 8 weeks gestation of pregnancy: Secondary | ICD-10-CM | POA: Insufficient documentation

## 2020-02-01 DIAGNOSIS — Z20822 Contact with and (suspected) exposure to covid-19: Secondary | ICD-10-CM | POA: Insufficient documentation

## 2020-02-01 DIAGNOSIS — R4589 Other symptoms and signs involving emotional state: Secondary | ICD-10-CM

## 2020-02-01 DIAGNOSIS — F331 Major depressive disorder, recurrent, moderate: Secondary | ICD-10-CM | POA: Diagnosis not present

## 2020-02-01 DIAGNOSIS — Z79899 Other long term (current) drug therapy: Secondary | ICD-10-CM | POA: Insufficient documentation

## 2020-02-01 DIAGNOSIS — Z046 Encounter for general psychiatric examination, requested by authority: Secondary | ICD-10-CM

## 2020-02-01 LAB — COMPREHENSIVE METABOLIC PANEL
ALT: 12 U/L (ref 0–44)
AST: 16 U/L (ref 15–41)
Albumin: 4.1 g/dL (ref 3.5–5.0)
Alkaline Phosphatase: 60 U/L (ref 38–126)
Anion gap: 9 (ref 5–15)
BUN: 7 mg/dL (ref 6–20)
CO2: 26 mmol/L (ref 22–32)
Calcium: 9.3 mg/dL (ref 8.9–10.3)
Chloride: 101 mmol/L (ref 98–111)
Creatinine, Ser: 0.67 mg/dL (ref 0.44–1.00)
GFR calc Af Amer: >60 mL/min (ref >60)
GFR calc non Af Amer: >60 mL/min (ref >60)
Glucose, Bld: 116 mg/dL — ABNORMAL HIGH (ref 70–99)
Potassium: 3.8 mmol/L (ref 3.5–5.1)
Sodium: 136 mmol/L (ref 135–145)
Total Bilirubin: 0.7 mg/dL (ref 0.3–1.2)
Total Protein: 7.6 g/dL (ref 6.5–8.1)

## 2020-02-01 LAB — RESPIRATORY PANEL BY RT PCR (FLU A&B, COVID)
Influenza A by PCR: NEGATIVE
Influenza B by PCR: NEGATIVE
SARS Coronavirus 2 by RT PCR: NEGATIVE

## 2020-02-01 LAB — CBC
HCT: 39.2 % (ref 36.0–46.0)
Hemoglobin: 12.9 g/dL (ref 12.0–15.0)
MCH: 29.3 pg (ref 26.0–34.0)
MCHC: 32.9 g/dL (ref 30.0–36.0)
MCV: 89.1 fL (ref 80.0–100.0)
Platelets: 215 10*3/uL (ref 150–400)
RBC: 4.4 MIL/uL (ref 3.87–5.11)
RDW: 13.2 % (ref 11.5–15.5)
WBC: 8.5 10*3/uL (ref 4.0–10.5)
nRBC: 0 % (ref 0.0–0.2)

## 2020-02-01 LAB — URINE DRUG SCREEN, QUALITATIVE (ARMC ONLY)
Amphetamines, Ur Screen: NOT DETECTED
Barbiturates, Ur Screen: NOT DETECTED
Benzodiazepine, Ur Scrn: NOT DETECTED
Cannabinoid 50 Ng, Ur ~~LOC~~: POSITIVE — AB
Cocaine Metabolite,Ur ~~LOC~~: POSITIVE — AB
MDMA (Ecstasy)Ur Screen: NOT DETECTED
Methadone Scn, Ur: NOT DETECTED
Opiate, Ur Screen: NOT DETECTED
Phencyclidine (PCP) Ur S: NOT DETECTED
Tricyclic, Ur Screen: NOT DETECTED

## 2020-02-01 LAB — ACETAMINOPHEN LEVEL: Acetaminophen (Tylenol), Serum: <10 ug/mL — ABNORMAL LOW (ref 10–30)

## 2020-02-01 LAB — SALICYLATE LEVEL: Salicylate Lvl: <7 mg/dL — ABNORMAL LOW (ref 7.0–30.0)

## 2020-02-01 LAB — POCT PREGNANCY, URINE: Preg Test, Ur: POSITIVE — AB

## 2020-02-01 LAB — ETHANOL: Alcohol, Ethyl (B): <10 mg/dL (ref <10)

## 2020-02-01 MED ORDER — PRENATAL MULTIVITAMIN CH: 1.0000 | ORAL_TABLET | Freq: Every day | ORAL | Status: DC

## 2020-02-01 NOTE — Discharge Instructions (Signed)
Thank you for letting us take care of you in the emergency department today.  Please follow up and see your [facility] OBGYN regarding your pregnancy.

## 2020-02-01 NOTE — ED Triage Notes (Signed)
Patient coming in under IVC from [facility] for SI. Patient denies plan. Patient reports being seen recently in this ED for same.

## 2020-02-01 NOTE — Consult Note (Signed)
[facility] Face-to-Face Psychiatry Consult   Reason for Consult:  Psych evaluation Referring Physician:  Dr. Colon Branch Patient Identification: [name] MRN:  [id number] Principal Diagnosis: MDD (major depressive disorder) Diagnosis:  Principal Problem:   MDD (major depressive disorder) Active Problems:   Thoughts of self harm   Total Time spent with patient: 1 hour  Subjective:  Per EDP [name] is a 34 y.o. female patient admitted with history of depression, substance use, thyroid disease who presents to the emergency department under IVC from [facility] for SI.  Per paperwork that arrived with the patient, patient reportedly told providers at [facility] that she "hates my life" and "feel like ending it. " "Has decreased energy, not eating, using substances intermittently despite possible pregnancy.  Has plan of jumping out window, cutting."  HPI:  [name], 34 y.o., female patient seen face to face by this provider; chart reviewed and consulted with Dr. Colon Branch on 02/01/20.  On evaluation [name] reports that she has been depressed for some time now.  She states that she thinks she may be more depressed now because she's pregnant.  She reports being here a few weeks ago, however she stated that the outpatient resource could not help her because she was pregnant and therefor did not prescribe her suitable medication to help her depression. She has had 5 children one passed away, this pregnancy makes 6 children and she stated that the thought of it is overwhelming. She stated that she has no intention of killing herself, just hurting herself by cutting or doing some other type of harm to herself.    During evaluation [name] is lying in bed; she is alert/oriented x 4; calm/cooperative/depressed; and mood is congruent with affect.  Patient is speaking in a clear tone at moderate volume, and normal pace; with good eye contact.  Her thought process is coherent and  relevant; There is no indication that she is currently responding to internal/external stimuli or experiencing delusional thought content.  Patient endorses passive suicidal/self-harm ideation, pt denies homicidal ideation, psychosis, and paranoia.  Patient has remained calm throughout assessment and has answered questions appropriately.   Past Psychiatric History: Major depression disorder  Risk to Self:  yes Risk to Others:  no Prior Inpatient Therapy:  yes Prior Outpatient Therapy:  yes  Past Medical History:  Past Medical History:  Diagnosis Date  . Depression   . History of substance abuse (HCC)   . History of suicide attempt   . History of thyroid disease   . Hypertension   . Thyroid disease     Past Surgical History:  Procedure Laterality Date  . CESAREAN SECTION  2000, 2002, 2013   Family History:  Family History  Problem Relation Age of Onset  . Diabetes Mother    Family Psychiatric  History: unknown Social History:  Social History   Substance and Sexual Activity  Alcohol Use Not Currently   Comment: social drinker     Social History   Substance and Sexual Activity  Drug Use Not Currently  . Types: Cocaine, Marijuana   Comment: Positive UDS 12/2017    Social History   Socioeconomic History  . Marital status: Single    Spouse name: Not on file  . Number of children: Not on file  . Years of education: Not on file  . Highest education level: Not on file  Occupational History  . Not on file  Tobacco Use  . Smoking status: Current Every  Day Smoker    Packs/day: 0.50  . Smokeless tobacco: Never Used  Substance and Sexual Activity  . Alcohol use: Not Currently    Comment: social drinker  . Drug use: Not Currently    Types: Cocaine, Marijuana    Comment: Positive UDS 12/2017  . Sexual activity: Not Currently  Other Topics Concern  . Not on file  Social History Narrative  . Not on file   Social Determinants of Health   Financial Resource Strain:    . Difficulty of Paying Living Expenses: Not on file  Food Insecurity:   . Worried About Programme researcher, broadcasting/film/video in the Last Year: Not on file  . Ran Out of Food in the Last Year: Not on file  Transportation Needs:   . Lack of Transportation (Medical): Not on file  . Lack of Transportation (Non-Medical): Not on file  Physical Activity:   . Days of Exercise per Week: Not on file  . Minutes of Exercise per Session: Not on file  Stress:   . Feeling of Stress : Not on file  Social Connections:   . Frequency of Communication with Friends and Family: Not on file  . Frequency of Social Gatherings with Friends and Family: Not on file  . Attends Religious Services: Not on file  . Active Member of Clubs or Organizations: Not on file  . Attends Banker Meetings: Not on file  . Marital Status: Not on file   Additional Social History:    Allergies:   Allergies  Allergen Reactions  . Aspirin Anaphylaxis, Diarrhea and Nausea And Vomiting  . Banana Anaphylaxis, Itching, Shortness Of Breath and Swelling  . Other Itching, Shortness Of Breath and Swelling  . Peanut Oil Itching, Shortness Of Breath and Swelling  . Peanut-Containing Drug Products Shortness Of Breath, Itching and Swelling  . Pecan Nut (Diagnostic) Anaphylaxis    Labs:  Results for orders placed or performed during the hospital encounter of 02/01/20 (from the past 48 hour(s))  Comprehensive metabolic panel     Status: Abnormal   Collection Time: 02/01/20 10:05 PM  Result Value Ref Range   Sodium 136 135 - 145 mmol/L   Potassium 3.8 3.5 - 5.1 mmol/L   Chloride 101 98 - 111 mmol/L   CO2 26 22 - 32 mmol/L   Glucose, Bld 116 (H) 70 - 99 mg/dL   BUN 7 6 - 20 mg/dL   Creatinine, Ser 8.84 0.44 - 1.00 mg/dL   Calcium 9.3 8.9 - 16.6 mg/dL   Total Protein 7.6 6.5 - 8.1 g/dL   Albumin 4.1 3.5 - 5.0 g/dL   AST 16 15 - 41 U/L   ALT 12 0 - 44 U/L   Alkaline Phosphatase 60 38 - 126 U/L   Total Bilirubin 0.7 0.3 - 1.2 mg/dL    GFR calc non Af Amer >60 >60 mL/min   GFR calc Af Amer >60 >60 mL/min   Anion gap 9 5 - 15    Comment: Performed at [facility], [address] [place], [place] [address]  Ethanol     Status: None   Collection Time: 02/01/20 10:05 PM  Result Value Ref Range   Alcohol, Ethyl (B) <10 <10 mg/dL    Comment: (NOTE) Lowest detectable limit for serum alcohol is 10 mg/dL. For medical purposes only. Performed at [facility], [address] [place], [place] [address]   Salicylate level     Status: Abnormal   Collection Time: 02/01/20  10:05 PM  Result Value Ref Range   Salicylate Lvl <7.0 (L) 7.0 - 30.0 mg/dL    Comment: Performed at [facility], [address] [place], [place] [address]  Acetaminophen level     Status: Abnormal   Collection Time: 02/01/20 10:05 PM  Result Value Ref Range   Acetaminophen (Tylenol), Serum <10 (L) 10 - 30 ug/mL    Comment: (NOTE) Therapeutic concentrations vary significantly. A range of 10-30 ug/mL  may be an effective concentration for many patients. However, some  are best treated at concentrations outside of this range. Acetaminophen concentrations >150 ug/mL at 4 hours after ingestion  and >50 ug/mL at 12 hours after ingestion are often associated with  toxic reactions. Performed at [facility], [address] [place], [place] [address]   cbc     Status: None   Collection Time: 02/01/20 10:05 PM  Result Value Ref Range   WBC 8.5 4.0 - 10.5 K/uL   RBC 4.40 3.87 - 5.11 MIL/uL   Hemoglobin 12.9 12.0 - 15.0 g/dL   HCT 76.7 34.1 - 93.7 %   MCV 89.1 80.0 - 100.0 fL   MCH 29.3 26.0 - 34.0 pg   MCHC 32.9 30.0 - 36.0 g/dL   RDW 90.2 40.9 - 73.5 %   Platelets 215 150 - 400 K/uL   nRBC 0.0 0.0 - 0.2 %    Comment: Performed at [facility], [address] [place], [place] [address]  Urine Drug Screen, Qualitative     Status: Abnormal   Collection Time: 02/01/20 10:05 PM  Result Value Ref Range    Tricyclic, Ur Screen NONE DETECTED NONE DETECTED   Amphetamines, Ur Screen NONE DETECTED NONE DETECTED   MDMA (Ecstasy)Ur Screen NONE DETECTED NONE DETECTED   Cocaine Metabolite,Ur [place] POSITIVE (A) NONE DETECTED   Opiate, Ur Screen NONE DETECTED NONE DETECTED   Phencyclidine (PCP) Ur S NONE DETECTED NONE DETECTED   Cannabinoid 50 Ng, Ur [place] POSITIVE (A) NONE DETECTED   Barbiturates, Ur Screen NONE DETECTED NONE DETECTED   Benzodiazepine, Ur Scrn NONE DETECTED NONE DETECTED   Methadone Scn, Ur NONE DETECTED NONE DETECTED    Comment: (NOTE) Tricyclics + metabolites, urine    Cutoff 1000 ng/mL Amphetamines + metabolites, urine  Cutoff 1000 ng/mL MDMA (Ecstasy), urine              Cutoff 500 ng/mL Cocaine Metabolite, urine          Cutoff 300 ng/mL Opiate + metabolites, urine        Cutoff 300 ng/mL Phencyclidine (PCP), urine         Cutoff 25 ng/mL Cannabinoid, urine                 Cutoff 50 ng/mL Barbiturates + metabolites, urine  Cutoff 200 ng/mL Benzodiazepine, urine              Cutoff 200 ng/mL Methadone, urine                   Cutoff 300 ng/mL The urine drug screen provides only a preliminary, unconfirmed analytical test result and should not be used for non-medical purposes. Clinical consideration and professional judgment should be applied to any positive drug screen result due to possible interfering substances. A more specific alternate chemical method must be used in order to obtain a confirmed analytical result. Gas chromatography / mass spectrometry (GC/MS) is the preferred confirmat ory method. Performed at [facility], [phone]  [address] [place], [place] [address]   Pregnancy, urine POC     Status: Abnormal   Collection Time: 02/01/20 10:27 PM  Result Value Ref Range   Preg Test, Ur POSITIVE (A) NEGATIVE    Comment:        THE SENSITIVITY OF THIS METHODOLOGY IS >24 mIU/mL     Current Facility-Administered Medications  Medication Dose Route Frequency  Provider Last Rate Last Admin  . [START ON 02/02/2020] prenatal multivitamin tablet 1 tablet  1 tablet Oral Q1200 Miguel Aschoff., MD       Current Outpatient Medications  Medication Sig Dispense Refill  . albuterol (PROVENTIL HFA;VENTOLIN HFA) 108 (90 Base) MCG/ACT inhaler Inhale 2 puffs into the lungs every 6 (six) hours as needed for wheezing or shortness of breath. (Patient not taking: Reported on 03/20/2018) 1 Inhaler 0  . dicyclomine (BENTYL) 10 MG capsule Take 1 capsule (10 mg total) by mouth 3 (three) times daily as needed for up to 2 days for spasms. 6 capsule 0  . doxycycline (VIBRAMYCIN) 100 MG capsule Take 1 capsule (100 mg total) by mouth 2 (two) times daily. (Patient not taking: Reported on 12/31/2019) 28 capsule 0  . fexofenadine-pseudoephedrine (ALLEGRA-D) 60-120 MG 12 hr tablet Take 1 tablet by mouth 2 (two) times daily. (Patient not taking: Reported on 12/31/2019) 20 tablet 0  . folic acid (FOLVITE) 1 MG tablet Take 1 tablet (1 mg total) by mouth daily. (Patient not taking: Reported on 12/31/2019) 30 tablet 1  . hydrochlorothiazide (HYDRODIURIL) 25 MG tablet Take 1 tablet (25 mg total) by mouth daily. (Patient not taking: Reported on 12/31/2019) 30 tablet 1  . hydroxyprogesterone caproate (MAKENA) 250 mg/mL OIL injection Inject 1 mL (250 mg total) into the muscle once a week. On Tuesdays (Patient not taking: Reported on 12/31/2019) 0.98 mL 0  . ondansetron (ZOFRAN ODT) 8 MG disintegrating tablet Take 1 tablet (8 mg total) by mouth every 8 (eight) hours as needed for nausea or vomiting. (Patient not taking: Reported on 12/31/2019) 5 tablet 0  . pantoprazole (PROTONIX) 20 MG tablet Take 1 tablet (20 mg total) by mouth daily. 30 tablet 1  . Prenatal Vit-Fe Fumarate-FA (PRENATAL MULTIVITAMIN) TABS tablet Take 1 tablet by mouth daily at 12 noon. (Patient not taking: Reported on 12/31/2019) 30 tablet 1  . QUEtiapine (SEROQUEL) 50 MG tablet Take 1 tablet (50 mg total) by mouth at bedtime. (Patient not  taking: Reported on 03/20/2018) 30 tablet 1  . senna (SENOKOT) 8.6 MG TABS tablet Take 1 tablet (8.6 mg total) by mouth 2 (two) times daily at 10 AM and 5 PM. (Patient not taking: Reported on 03/20/2018) 60 each 1  . sertraline (ZOLOFT) 50 MG tablet Take 1 tablet (50 mg total) by mouth at bedtime. (Patient not taking: Reported on 03/20/2018) 30 tablet 1    Musculoskeletal: Strength & Muscle Tone: within normal limits Gait & Station: normal Patient leans: N/A  Psychiatric Specialty Exam: Physical Exam  Nursing note and vitals reviewed. Constitutional: She is oriented to person, place, and time. She appears well-developed and well-nourished.  HENT:  Head: Normocephalic.  Eyes: Pupils are equal, round, and reactive to light. Conjunctivae and EOM are normal.  Respiratory: Effort normal and breath sounds normal.  Musculoskeletal:        General: Normal range of motion.     Cervical back: Normal range of motion and neck supple.  Neurological: She is alert and oriented to person, place, and time.  Skin: Skin is warm  and dry.  Psychiatric: Her speech is normal and behavior is normal. Judgment and thought content normal. Cognition and memory are normal. She exhibits a depressed mood.    Review of Systems  Psychiatric/Behavioral: Negative for agitation, hallucinations and suicidal ideas. The patient is not hyperactive.     Blood pressure (!) 163/78, pulse 63, temperature 97.9 F (36.6 C), temperature source Oral, resp. rate 17, height 4\' 11"  (1.499 m), weight 70.3 kg, SpO2 100 %.Body mass index is 31.31 kg/m.  General Appearance: Disheveled  Eye Contact:  Good  Speech:  Normal Rate  Volume:  Normal  Mood:  Depressed and Hopeless  Affect:  Congruent  Thought Process:  Coherent and Descriptions of Associations: Intact  Orientation:  Full (Time, Place, and Person)  Thought Content:  WDL  Suicidal Thoughts:  Yes.  without intent/plan  Homicidal Thoughts:  No  Memory:  Immediate;   Fair   Judgement:  Impaired  Insight:  Fair  Psychomotor Activity:  Normal  Concentration:  Attention Span: Good  Recall:  Good  Fund of Knowledge:  Good  Language:  Good  Akathisia:  NA  Handed:  Right  AIMS (if indicated):     Assets:  Communication Skills Desire for Improvement  ADL's:  Intact  Cognition:  WNL  Sleep:   pt states she lacks sleep     Treatment Plan Summary: Medication management, Plan psychiatric inpatient hospitalization and Therapeutic interventions   Disposition: Recommend psychiatric Inpatient admission when medically cleared. Supportive therapy provided about ongoing stressors.  Deloria Lair, NP 02/01/2020 11:22 PM

## 2020-02-01 NOTE — ED Provider Notes (Signed)
[facility] Emergency Department Provider Note  ____________________________________________   First MD Initiated Contact with Patient 02/01/20 2218     (approximate)  I have reviewed the triage vital signs and the nursing notes.  History  Chief Complaint Suicidal    HPI [name] is a 34 y.o. female with history of depression, substance use, thyroid disease who presents to the emergency department under IVC from [facility] for SI.  Per paperwork that arrived with the patient, patient reportedly told providers at [facility] that she "hates my life" and "feel like ending it. " "Has decreased energy, not eating, using substances intermittently despite possible pregnancy.  Has plan of jumping out window, cutting."   Past Medical Hx Past Medical History:  Diagnosis Date  . Depression   . History of substance abuse (HCC)   . History of suicide attempt   . History of thyroid disease   . Hypertension   . Thyroid disease     Problem List Patient Active Problem List   Diagnosis Date Noted  . Abdominal pain 03/20/2018  . Cocaine use disorder, moderate, dependence (HCC) 01/01/2018  . Cannabis use disorder, moderate, dependence (HCC) 01/01/2018  . Pregnant 12/31/2017  . Medically noncompliant 12/31/2017  . Tobacco use disorder 01/12/2016  . Stimulant use disorder (HCC) (cocaine) 01/12/2016  . Alcohol use disorder, moderate, dependence (HCC) 01/12/2016  . Self-inflicted laceration of wrist (HCC) 01/11/2016  . Major depressive disorder, recurrent severe without psychotic features (HCC) 01/11/2016    Past Surgical Hx Past Surgical History:  Procedure Laterality Date  . CESAREAN SECTION  2000, 2002, 2013    Medications Prior to Admission medications   Medication Sig Start Date End Date Taking? Authorizing Provider  albuterol (PROVENTIL HFA;VENTOLIN HFA) 108 (90 Base) MCG/ACT inhaler Inhale 2 puffs into the lungs every 6 (six) hours as needed for wheezing  or shortness of breath. Patient not taking: Reported on 03/20/2018 02/27/18   Minna Antis, MD  dicyclomine (BENTYL) 10 MG capsule Take 1 capsule (10 mg total) by mouth 3 (three) times daily as needed for up to 2 days for spasms. 11/21/18 11/23/18  Dionne Bucy, MD  doxycycline (VIBRAMYCIN) 100 MG capsule Take 1 capsule (100 mg total) by mouth 2 (two) times daily. Patient not taking: Reported on 12/31/2019 10/14/18   Myrna Blazer, MD  fexofenadine-pseudoephedrine (ALLEGRA-D) 60-120 MG 12 hr tablet Take 1 tablet by mouth 2 (two) times daily. Patient not taking: Reported on 12/31/2019 10/30/18   Joni Reining, PA-C  folic acid (FOLVITE) 1 MG tablet Take 1 tablet (1 mg total) by mouth daily. Patient not taking: Reported on 12/31/2019 01/05/18   Pucilowska, Braulio Conte B, MD  hydrochlorothiazide (HYDRODIURIL) 25 MG tablet Take 1 tablet (25 mg total) by mouth daily. Patient not taking: Reported on 12/31/2019 10/21/19   Phineas Semen, MD  hydroxyprogesterone caproate (MAKENA) 250 mg/mL OIL injection Inject 1 mL (250 mg total) into the muscle once a week. On Tuesdays Patient not taking: Reported on 12/31/2019 01/09/18   Pucilowska, Braulio Conte B, MD  ondansetron (ZOFRAN ODT) 8 MG disintegrating tablet Take 1 tablet (8 mg total) by mouth every 8 (eight) hours as needed for nausea or vomiting. Patient not taking: Reported on 12/31/2019 11/21/18   Dionne Bucy, MD  pantoprazole (PROTONIX) 20 MG tablet Take 1 tablet (20 mg total) by mouth daily. 05/17/16 05/17/17  Jennye Moccasin, MD  Prenatal Vit-Fe Fumarate-FA (PRENATAL MULTIVITAMIN) TABS tablet Take 1 tablet by mouth daily at 12 noon. Patient not  taking: Reported on 12/31/2019 01/04/18   Pucilowska, Ellin Goodie, MD  QUEtiapine (SEROQUEL) 50 MG tablet Take 1 tablet (50 mg total) by mouth at bedtime. Patient not taking: Reported on 03/20/2018 01/04/18   Pucilowska, Ellin Goodie, MD  senna (SENOKOT) 8.6 MG TABS tablet Take 1 tablet (8.6 mg total) by mouth 2  (two) times daily at 10 AM and 5 PM. Patient not taking: Reported on 03/20/2018 01/04/18   Pucilowska, Braulio Conte B, MD  sertraline (ZOLOFT) 50 MG tablet Take 1 tablet (50 mg total) by mouth at bedtime. Patient not taking: Reported on 03/20/2018 01/04/18   Shari Prows, MD    Allergies Aspirin, Banana, Other, Peanut oil, Peanut-containing drug products, and Pecan nut (diagnostic)  Family Hx Family History  Problem Relation Age of Onset  . Diabetes Mother     Social Hx Social History   Tobacco Use  . Smoking status: Current Every Day Smoker    Packs/day: 0.50  . Smokeless tobacco: Never Used  Substance Use Topics  . Alcohol use: Not Currently    Comment: social drinker  . Drug use: Not Currently    Types: Cocaine, Marijuana    Comment: Positive UDS 12/2017     Review of Systems  Constitutional: Negative for fever, chills. Eyes: Negative for visual changes. ENT: Negative for sore throat. Cardiovascular: Negative for chest pain. Respiratory: Negative for shortness of breath. Gastrointestinal: Negative for nausea, vomiting.  Genitourinary: Negative for dysuria. Musculoskeletal: Negative for leg swelling. Skin: Negative for rash. Neurological: Negative for for headaches.   Physical Exam  Vital Signs: ED Triage Vitals  Enc Vitals Group     BP 02/01/20 2206 (!) 163/78     Pulse Rate 02/01/20 2206 63     Resp 02/01/20 2206 17     Temp 02/01/20 2210 97.9 F (36.6 C)     Temp Source 02/01/20 2210 Oral     SpO2 02/01/20 2206 100 %     Weight 02/01/20 2206 155 lb (70.3 kg)     Height 02/01/20 2206 4\' 11"  (1.499 m)     Head Circumference --      Peak Flow --      Pain Score 02/01/20 2206 0     Pain Loc --      Pain Edu? --      Excl. in GC? --     Constitutional: Alert and oriented.  Head: Normocephalic. Atraumatic. Eyes: Conjunctivae clear. Sclera anicteric. Nose: No congestion. No rhinorrhea. Mouth/Throat: Mucous membranes are moist.  Neck: No stridor.     Cardiovascular: Normal rate. Extremities well perfused. Respiratory: Normal respiratory effort.   Gastrointestinal: Non-distended.  Musculoskeletal: No deformities. Neurologic:  Normal speech and language. No gross focal neurologic deficits are appreciated.  Skin: Skin is warm, dry and intact.  Psychiatric: SI  EKG  N/A    Radiology  N/A   Procedures  Procedure(s) performed (including critical care):  Procedures   Initial Impression / Assessment and Plan / ED Course  34 y.o. female who presents to the ED under IVC from [facility] for SI, as above.  Suspect patient's presentation is related to their known psychiatric diagnosis, likely worsened in setting of substance use.  Will obtain basic screening labs and consult psychiatry and TTS.   Patient's point-of-care pregnancy test is positive.  Updated patient on results, who already suspected she may be pregnant, as her last menstrual period was in December and she is typically regular.  She denies any abdominal pain, vaginal bleeding, or  issues related to this.  She reports she will follow-up with her [facility] as she has had several high risk pregnancies in the past.  This is an unplanned pregnancy, unsure how she feels about being pregnant at this time. Does want to initiate PNV, will order.  Otherwise, work-up notable for UDS positive for cocaine and cannabis.  Remainder of labs without actionable derangements.  Patient has been seen by psychiatry/TTS who recommended admission.   Final Clinical Impression(s) / ED Diagnosis  Final diagnoses:  Involuntary commitment  Suicidal ideation  Positive pregnancy test       Note:  This document was prepared using Dragon voice recognition software and may include unintentional dictation errors.   Lilia Pro., MD 02/02/20 [phone]
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
## 2020-02-01 NOTE — ED Notes (Signed)
Patient changed into hospital provided scrubs by this RN and Judeth Cornfield NT. Patient's belongings placed into labeled bag by Judeth Cornfield NT.  Patient's belonging's include: Jeans, pair black shoes, yellow shirt, green jacket, LG cell phone, cell phone charger,

## 2020-02-02 ENCOUNTER — Inpatient Hospital Stay: Payer: Medicaid Other

## 2020-02-02 ENCOUNTER — Inpatient Hospital Stay
Admit: 2020-02-02 | Discharge: 2020-02-06 | DRG: 832 | Disposition: A | Discharge status: Home / Self Care | Payer: Medicaid Other | Source: Intra-hospital | Attending: Psychiatry | Admitting: Psychiatry

## 2020-02-02 ENCOUNTER — Other Ambulatory Visit: Payer: Self-pay

## 2020-02-02 ENCOUNTER — Encounter: Payer: Self-pay | Admitting: Psychiatric/Mental Health

## 2020-02-02 DIAGNOSIS — R45851 Suicidal ideations: Secondary | ICD-10-CM | POA: Diagnosis present

## 2020-02-02 DIAGNOSIS — O219 Vomiting of pregnancy, unspecified: Secondary | ICD-10-CM | POA: Diagnosis present

## 2020-02-02 DIAGNOSIS — O99341 Other mental disorders complicating pregnancy, first trimester: Secondary | ICD-10-CM | POA: Diagnosis present

## 2020-02-02 DIAGNOSIS — Z91010 Allergy to peanuts: Secondary | ICD-10-CM

## 2020-02-02 DIAGNOSIS — Z3A08 8 weeks gestation of pregnancy: Secondary | ICD-10-CM | POA: Diagnosis not present

## 2020-02-02 DIAGNOSIS — F17210 Nicotine dependence, cigarettes, uncomplicated: Secondary | ICD-10-CM | POA: Diagnosis present

## 2020-02-02 DIAGNOSIS — Z915 Personal history of self-harm: Secondary | ICD-10-CM | POA: Diagnosis not present

## 2020-02-02 DIAGNOSIS — F1510 Other stimulant abuse, uncomplicated: Secondary | ICD-10-CM | POA: Diagnosis present

## 2020-02-02 DIAGNOSIS — O10011 Pre-existing essential hypertension complicating pregnancy, first trimester: Secondary | ICD-10-CM | POA: Diagnosis present

## 2020-02-02 DIAGNOSIS — E079 Disorder of thyroid, unspecified: Secondary | ICD-10-CM | POA: Diagnosis present

## 2020-02-02 DIAGNOSIS — F331 Major depressive disorder, recurrent, moderate: Secondary | ICD-10-CM | POA: Diagnosis not present

## 2020-02-02 DIAGNOSIS — Z833 Family history of diabetes mellitus: Secondary | ICD-10-CM

## 2020-02-02 DIAGNOSIS — O99331 Smoking (tobacco) complicating pregnancy, first trimester: Secondary | ICD-10-CM | POA: Diagnosis present

## 2020-02-02 DIAGNOSIS — F329 Major depressive disorder, single episode, unspecified: Secondary | ICD-10-CM | POA: Diagnosis present

## 2020-02-02 DIAGNOSIS — F1590 Other stimulant use, unspecified, uncomplicated: Secondary | ICD-10-CM | POA: Diagnosis present

## 2020-02-02 DIAGNOSIS — Z3491 Encounter for supervision of normal pregnancy, unspecified, first trimester: Secondary | ICD-10-CM

## 2020-02-02 DIAGNOSIS — Z91018 Allergy to other foods: Secondary | ICD-10-CM | POA: Diagnosis not present

## 2020-02-02 DIAGNOSIS — Z3490 Encounter for supervision of normal pregnancy, unspecified, unspecified trimester: Secondary | ICD-10-CM

## 2020-02-02 DIAGNOSIS — Z789 Other specified health status: Secondary | ICD-10-CM

## 2020-02-02 DIAGNOSIS — F339 Major depressive disorder, recurrent, unspecified: Secondary | ICD-10-CM | POA: Diagnosis present

## 2020-02-02 DIAGNOSIS — O3680X0 Pregnancy with inconclusive fetal viability, not applicable or unspecified: Secondary | ICD-10-CM

## 2020-02-02 DIAGNOSIS — R4589 Other symptoms and signs involving emotional state: Secondary | ICD-10-CM | POA: Diagnosis not present

## 2020-02-02 DIAGNOSIS — Z886 Allergy status to analgesic agent status: Secondary | ICD-10-CM | POA: Diagnosis not present

## 2020-02-02 HISTORY — DX: Anxiety disorder, unspecified: F41.9

## 2020-02-02 LAB — HCG, QUANTITATIVE, PREGNANCY: hCG, Beta Chain, Quant, S: 48584 m[IU]/mL — ABNORMAL HIGH (ref <5)

## 2020-02-02 MED ORDER — FLUOXETINE HCL 20 MG PO CAPS
20.0000 mg | ORAL_CAPSULE | Freq: Every day | ORAL | Status: DC
  Administered 2020-02-02 – 2020-02-06 (×3): 20 mg via ORAL
  Filled 2020-02-02 (×3): qty 1

## 2020-02-02 MED ORDER — PRENATAL MULTIVITAMIN CH
1.0000 | ORAL_TABLET | Freq: Every day | ORAL | Status: DC
  Administered 2020-02-02 – 2020-02-06 (×5): 1 via ORAL
  Filled 2020-02-02 (×5): qty 1

## 2020-02-02 MED ORDER — ACETAMINOPHEN 500 MG PO TABS
1000.0000 mg | ORAL_TABLET | Freq: Four times a day (QID) | ORAL | Status: DC | PRN
  Administered 2020-02-02 – 2020-02-06 (×6): 1000 mg via ORAL
  Filled 2020-02-02 (×6): qty 2

## 2020-02-02 NOTE — BHH Group Notes (Signed)
LCSW Group Therapy Note   02/02/2020 1:00 PM  Type of Therapy and Topic:  Group Therapy:  Overcoming Obstacles   Participation Level:  Minimal   Description of Group:    In this group patients will be encouraged to explore what they see as obstacles to their own wellness and recovery. They will be guided to discuss their thoughts, feelings, and behaviors related to these obstacles. The group will process together ways to cope with barriers, with attention given to specific choices patients can make. Each patient will be challenged to identify changes they are motivated to make in order to overcome their obstacles. This group will be process-oriented, with patients participating in exploration of their own experiences as well as giving and receiving support and challenge from other group members.   Therapeutic Goals: 1. Patient will identify personal and current obstacles as they relate to admission. 2. Patient will identify barriers that currently interfere with their wellness or overcoming obstacles.  3. Patient will identify feelings, thought process and behaviors related to these barriers. 4. Patient will identify two changes they are willing to make to overcome these obstacles:      Summary of Patient Progress Patient was present at the beginning of group and shared how she struggles "with having all these kids".  Patient shared how that has been problematic for her in the past. Patient left group early.    Therapeutic Modalities:   Cognitive Behavioral Therapy Solution Focused Therapy Motivational Interviewing Relapse Prevention Therapy  Penni Homans, MSW, LCSW 02/02/2020 12:47 PM

## 2020-02-02 NOTE — ED Provider Notes (Signed)
-----------------------------------------   12:19 AM on 02/02/2020 -----------------------------------------   Blood pressure (!) 163/78, pulse 63, temperature 97.9 F (36.6 C), temperature source Oral, resp. rate 17, height 1.499 m (4\' 11" ), weight 70.3 kg, SpO2 100 %.  The patient is calm and cooperative at this time.   Patient will be admitted to [facility].   [facility], MD 02/02/20 [phone]

## 2020-02-02 NOTE — Progress Notes (Signed)
Assumed care of patient at 1100, pt in bed resting, eyes closed.
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
## 2020-02-02 NOTE — Plan of Care (Signed)
D- Patient alert and oriented. Patient presents in a pleasant mood on assessment stating that she didn't sleep well last night "they wouldn't let me sleep". Patient also reported having a headache, rating her pain a "10/10", in which she did request pain medication from this Clinical research associate. Patient denies anxiety, but endorsed depression stating "I don't want to be here". Patient also denies SI, HI, AVH, at this time. Patient had no stated goals for today.  A- Scheduled medications administered to patient, per MD orders. Support and encouragement provided.  Routine safety checks conducted every 15 minutes.  Patient informed to notify staff with problems or concerns.  R- No adverse drug reactions noted. Patient contracts for safety at this time. Patient compliant with medications and treatment plan. Patient receptive, calm, and cooperative. Patient interacts well with others on the unit.  Patient remains safe at this time.  Problem: Education: Goal: Ability to state activities that reduce stress will improve Outcome: Progressing   Problem: Education: Goal: Knowledge of [place] [facility] Education information/materials will improve Outcome: Progressing Goal: Emotional status will improve Outcome: Progressing Goal: Mental status will improve Outcome: Progressing Goal: Verbalization of understanding the information provided will improve Outcome: Progressing   Problem: Activity: Goal: Interest or engagement in activities will improve Outcome: Progressing Goal: Sleeping patterns will improve Outcome: Progressing   Problem: Coping: Goal: Ability to verbalize frustrations and anger appropriately will improve Outcome: Progressing Goal: Ability to demonstrate self-control will improve Outcome: Progressing   Problem: Health Behavior/Discharge Planning: Goal: Compliance with treatment plan for underlying cause of condition will improve Outcome: Progressing   Problem: Physical Regulation: Goal:  Ability to maintain clinical measurements within normal limits will improve Outcome: Progressing   Problem: Safety: Goal: Periods of time without injury will increase Outcome: Progressing   Problem: Education: Goal: Utilization of techniques to improve thought processes will improve Outcome: Progressing Goal: Knowledge of the prescribed therapeutic regimen will improve Outcome: Progressing   Problem: Coping: Goal: Coping ability will improve Outcome: Progressing   Problem: Coping: Goal: Coping ability will improve Outcome: Progressing   Problem: Health Behavior/Discharge Planning: Goal: Identification of resources available to assist in meeting health care needs will improve Outcome: Progressing   Problem: Medication: Goal: Compliance with prescribed medication regimen will improve Outcome: Progressing   Problem: Self-Concept: Goal: Ability to disclose and discuss suicidal ideas will improve Outcome: Progressing

## 2020-02-02 NOTE — H&P (Signed)
Psychiatric Admission Assessment Adult  Patient Identification: [name] MRN:  [id number] Date of Evaluation:  02/02/2020 Chief Complaint:  MDD (major depressive disorder) [F32.9] Principal Diagnosis: MDD (major depressive disorder) Diagnosis:  Principal Problem:   MDD (major depressive disorder) Active Problems:   Stimulant use disorder (HCC) (cocaine)   Pregnant  History of Present Illness: Patient seen chart reviewed.  34 year old woman with a history of recurrent depression and substance abuse came to the hospital requesting help for her depressed mood.  She tells me she has been depressed for months and things have been getting worse recently.  She feels overwhelmed by the stressors in her life.  She has no income and is taking care of her 2 young children by her self.  Having a very difficult time making ends meet.  Has applied for disability but not been given it yet.  Patient is not receiving any outpatient psychiatric treatment and is not on any psychiatric medicine.  He admits to having recently used some cocaine.  Patient sleeps poorly at night appetite poor frequently hopeless.  Passive suicidal thoughts without specific plan. Associated Signs/Symptoms: Depression Symptoms:  depressed mood, anhedonia, psychomotor agitation, difficulty concentrating, hopelessness, suicidal thoughts without plan, (Hypo) Manic Symptoms:  Impulsivity, Anxiety Symptoms:  Excessive Worry, Psychotic Symptoms:  No psychosis reported PTSD Symptoms: Negative Total Time spent with patient: 1 hour  Past Psychiatric History: Patient has a history of prior psychiatric admission and treatment for similar situations.  Has been treated with Zoloft and Seroquel in the past.  She tells me that she thinks Zoloft has not actually been helpful for her.  She says even at higher doses she never found it to be useful.  We reviewed her old chart and saw that she had also been prescribed Celexa in the past.   Patient has a distant history of an actual suicide attempt but more recently has only had some cutting as an adult.  Is the patient at risk to self? Yes.    Has the patient been a risk to self in the past 6 months? Yes.    Has the patient been a risk to self within the distant past? Yes.    Is the patient a risk to others? No.  Has the patient been a risk to others in the past 6 months? No.  Has the patient been a risk to others within the distant past? No.   Prior Inpatient Therapy:   Prior Outpatient Therapy:    Alcohol Screening: 1. How often do you have a drink containing alcohol?: Never 2. How many drinks containing alcohol do you have on a typical day when you are drinking?: 1 or 2 3. How often do you have six or more drinks on one occasion?: Never AUDIT-C Score: 0 4. How often during the last year have you found that you were not able to stop drinking once you had started?: Never 5. How often during the last year have you failed to do what was normally expected from you becasue of drinking?: Never 6. How often during the last year have you needed a first drink in the morning to get yourself going after a heavy drinking session?: Never 7. How often during the last year have you had a feeling of guilt of remorse after drinking?: Never 8. How often during the last year have you been unable to remember what happened the night before because you had been drinking?: Never 9. Have you or someone else been injured as  a result of your drinking?: No 10. Has a relative or friend or a doctor or another health worker been concerned about your drinking or suggested you cut down?: No Alcohol Use Disorder Identification Test Final Score (AUDIT): 0 Alcohol Brief Interventions/Follow-up: Continued Monitoring Substance Abuse History in the last 12 months:  Yes.   Consequences of Substance Abuse: Medical Consequences:  Worsening of mood problems Previous Psychotropic Medications: Yes  Psychological  Evaluations: Yes  Past Medical History:  Past Medical History:  Diagnosis Date  . Anxiety   . Depression   . History of substance abuse (HCC)   . History of suicide attempt   . History of thyroid disease   . Hypertension   . Thyroid disease     Past Surgical History:  Procedure Laterality Date  . CESAREAN SECTION  2000, 2002, 2013   Family History:  Family History  Problem Relation Age of Onset  . Diabetes Mother    Family Psychiatric  History: None reported Tobacco Screening: Have you used any form of tobacco in the last 30 days? (Cigarettes, Smokeless Tobacco, Cigars, and/or Pipes): Yes Tobacco use, Select all that apply: 5 or more cigarettes per day Are you interested in Tobacco Cessation Medications?: No, patient refused Counseled patient on smoking cessation including recognizing danger situations, developing coping skills and basic information about quitting provided: Refused/Declined practical counseling Social History:  Social History   Substance and Sexual Activity  Alcohol Use Not Currently   Comment: social drinker     Social History   Substance and Sexual Activity  Drug Use Not Currently  . Types: Cocaine, Marijuana   Comment: Positive UDS 12/2017    Additional Social History:                           Allergies:   Allergies  Allergen Reactions  . Aspirin Anaphylaxis, Diarrhea and Nausea And Vomiting  . Banana Anaphylaxis, Itching, Shortness Of Breath and Swelling  . Other Itching, Shortness Of Breath and Swelling  . Peanut Oil Itching, Shortness Of Breath and Swelling  . Peanut-Containing Drug Products Shortness Of Breath, Itching and Swelling  . Pecan Nut (Diagnostic) Anaphylaxis   Lab Results:  Results for orders placed or performed during the hospital encounter of 02/01/20 (from the past 48 hour(s))  Comprehensive metabolic panel     Status: Abnormal   Collection Time: 02/01/20 10:05 PM  Result Value Ref Range   Sodium 136 135 - 145  mmol/L   Potassium 3.8 3.5 - 5.1 mmol/L   Chloride 101 98 - 111 mmol/L   CO2 26 22 - 32 mmol/L   Glucose, Bld 116 (H) 70 - 99 mg/dL   BUN 7 6 - 20 mg/dL   Creatinine, Ser 1.610.67 0.44 - 1.00 mg/dL   Calcium 9.3 8.9 - 09.610.3 mg/dL   Total Protein 7.6 6.5 - 8.1 g/dL   Albumin 4.1 3.5 - 5.0 g/dL   AST 16 15 - 41 U/L   ALT 12 0 - 44 U/L   Alkaline Phosphatase 60 38 - 126 U/L   Total Bilirubin 0.7 0.3 - 1.2 mg/dL   GFR calc non Af Amer >60 >60 mL/min   GFR calc Af Amer >60 >60 mL/min   Anion gap 9 5 - 15    Comment: Performed at [facility], [address] [place], [place] [id number]  Ethanol     Status: None   Collection Time: 02/01/20 10:05 PM  Result  Value Ref Range   Alcohol, Ethyl (B) <10 <10 mg/dL    Comment: (NOTE) Lowest detectable limit for serum alcohol is 10 mg/dL. For medical purposes only. Performed at [facility], [address] [place], [place] [address]   Salicylate level     Status: Abnormal   Collection Time: 02/01/20 10:05 PM  Result Value Ref Range   Salicylate Lvl <7.0 (L) 7.0 - 30.0 mg/dL    Comment: Performed at [facility], [address] [place], [place] [address]  Acetaminophen level     Status: Abnormal   Collection Time: 02/01/20 10:05 PM  Result Value Ref Range   Acetaminophen (Tylenol), Serum <10 (L) 10 - 30 ug/mL    Comment: (NOTE) Therapeutic concentrations vary significantly. A range of 10-30 ug/mL  may be an effective concentration for many patients. However, some  are best treated at concentrations outside of this range. Acetaminophen concentrations >150 ug/mL at 4 hours after ingestion  and >50 ug/mL at 12 hours after ingestion are often associated with  toxic reactions. Performed at [facility], [address] [place], [place] [address]   cbc     Status: None   Collection Time: 02/01/20 10:05 PM  Result Value Ref Range   WBC 8.5 4.0 - 10.5 K/uL   RBC 4.40 3.87 - 5.11 MIL/uL   Hemoglobin 12.9  12.0 - 15.0 g/dL   HCT 67.5 91.6 - 38.4 %   MCV 89.1 80.0 - 100.0 fL   MCH 29.3 26.0 - 34.0 pg   MCHC 32.9 30.0 - 36.0 g/dL   RDW 66.5 99.3 - 57.0 %   Platelets 215 150 - 400 K/uL   nRBC 0.0 0.0 - 0.2 %    Comment: Performed at [facility], [address] [place], [place] [address]  Urine Drug Screen, Qualitative     Status: Abnormal   Collection Time: 02/01/20 10:05 PM  Result Value Ref Range   Tricyclic, Ur Screen NONE DETECTED NONE DETECTED   Amphetamines, Ur Screen NONE DETECTED NONE DETECTED   MDMA (Ecstasy)Ur Screen NONE DETECTED NONE DETECTED   Cocaine Metabolite,Ur [place] POSITIVE (A) NONE DETECTED   Opiate, Ur Screen NONE DETECTED NONE DETECTED   Phencyclidine (PCP) Ur S NONE DETECTED NONE DETECTED   Cannabinoid 50 Ng, Ur [place] POSITIVE (A) NONE DETECTED   Barbiturates, Ur Screen NONE DETECTED NONE DETECTED   Benzodiazepine, Ur Scrn NONE DETECTED NONE DETECTED   Methadone Scn, Ur NONE DETECTED NONE DETECTED    Comment: (NOTE) Tricyclics + metabolites, urine    Cutoff 1000 ng/mL Amphetamines + metabolites, urine  Cutoff 1000 ng/mL MDMA (Ecstasy), urine              Cutoff 500 ng/mL Cocaine Metabolite, urine          Cutoff 300 ng/mL Opiate + metabolites, urine        Cutoff 300 ng/mL Phencyclidine (PCP), urine         Cutoff 25 ng/mL Cannabinoid, urine                 Cutoff 50 ng/mL Barbiturates + metabolites, urine  Cutoff 200 ng/mL Benzodiazepine, urine              Cutoff 200 ng/mL Methadone, urine                   Cutoff 300 ng/mL The urine drug screen provides only a preliminary, unconfirmed analytical test result and should not be used for  non-medical purposes. Clinical consideration and professional judgment should be applied to any positive drug screen result due to possible interfering substances. A more specific alternate chemical method must be used in order to obtain a confirmed analytical result. Gas chromatography / mass spectrometry (GC/MS) is the  preferred confirmat ory method. Performed at [facility], [address] [place], [place] [address]   Pregnancy, urine POC     Status: Abnormal   Collection Time: 02/01/20 10:27 PM  Result Value Ref Range   Preg Test, Ur POSITIVE (A) NEGATIVE    Comment:        THE SENSITIVITY OF THIS METHODOLOGY IS >24 mIU/mL   Respiratory Panel by RT PCR (Flu A&B, Covid) - Nasopharyngeal Swab     Status: None   Collection Time: 02/01/20 10:49 PM   Specimen: Nasopharyngeal Swab  Result Value Ref Range   SARS Coronavirus 2 by RT PCR NEGATIVE NEGATIVE    Comment: (NOTE) SARS-CoV-2 target nucleic acids are NOT DETECTED. The SARS-CoV-2 RNA is generally detectable in upper respiratoy specimens during the acute phase of infection. The lowest concentration of SARS-CoV-2 viral copies this assay can detect is 131 copies/mL. A negative result does not preclude SARS-Cov-2 infection and should not be used as the sole basis for treatment or other patient management decisions. A negative result may occur with  improper specimen collection/handling, submission of specimen other than nasopharyngeal swab, presence of viral mutation(s) within the areas targeted by this assay, and inadequate number of viral copies (<131 copies/mL). A negative result must be combined with clinical observations, patient history, and epidemiological information. The expected result is Negative. Fact Sheet for Patients:  https://www.moore.com/ Fact Sheet for Healthcare Providers:  https://www.young.biz/ This test is not yet ap proved or cleared by the [place] FDA and  has been authorized for detection and/or diagnosis of SARS-CoV-2 by FDA under an Emergency Use Authorization (EUA). This EUA will remain  in effect (meaning this test can be used) for the duration of the COVID-19 declaration under Section 564(b)(1) of the Act, 21 U.S.C. section 360bbb-3(b)(1), unless the  authorization is terminated or revoked sooner.    Influenza A by PCR NEGATIVE NEGATIVE   Influenza B by PCR NEGATIVE NEGATIVE    Comment: (NOTE) The Xpert Xpress SARS-CoV-2/FLU/RSV assay is intended as an aid in  the diagnosis of influenza from Nasopharyngeal swab specimens and  should not be used as a sole basis for treatment. Nasal washings and  aspirates are unacceptable for Xpert Xpress SARS-CoV-2/FLU/RSV  testing. Fact Sheet for Patients: https://www.moore.com/ Fact Sheet for Healthcare Providers: https://www.young.biz/ This test is not yet approved or cleared by the [place] FDA and  has been authorized for detection and/or diagnosis of SARS-CoV-2 by  FDA under an Emergency Use Authorization (EUA). This EUA will remain  in effect (meaning this test can be used) for the duration of the  Covid-19 declaration under Section 564(b)(1) of the Act, 21  U.S.C. section 360bbb-3(b)(1), unless the authorization is  terminated or revoked. Performed at [facility], [address] [place], [place] [address]     Blood Alcohol level:  Lab Results  Component Value Date   ETH <10 02/01/2020   ETH 129 (H) 01/07/2018    Metabolic Disorder Labs:  Lab Results  Component Value Date   HGBA1C 5.1 01/02/2018   MPG 99.67 01/02/2018   No results found for: PROLACTIN Lab Results  Component Value Date   CHOL 218 (H) 01/02/2018   TRIG 187 (H)  01/02/2018   HDL 81 01/02/2018   CHOLHDL 2.7 01/02/2018   VLDL 37 01/02/2018   LDLCALC 100 (H) 01/02/2018   LDLCALC 82 01/12/2016    Current Medications: Current Facility-Administered Medications  Medication Dose Route Frequency Provider Last Rate Last Admin  . acetaminophen (TYLENOL) tablet 1,000 mg  1,000 mg Oral Q6H PRN Papa [place], Jackquline Denmark, MD   1,000 mg at 02/02/20 1127  . FLUoxetine (PROZAC) capsule 20 mg  20 mg Oral Daily Ashritha Desrosiers T, MD   20 mg at 02/02/20 1308  . prenatal multivitamin  tablet 1 tablet  1 tablet Oral Q1200 Jearld Lesch, NP   1 tablet at 02/02/20 0827   PTA Medications: Medications Prior to Admission  Medication Sig Dispense Refill Last Dose  . albuterol (PROVENTIL HFA;VENTOLIN HFA) 108 (90 Base) MCG/ACT inhaler Inhale 2 puffs into the lungs every 6 (six) hours as needed for wheezing or shortness of breath. (Patient not taking: Reported on 03/20/2018) 1 Inhaler 0   . dicyclomine (BENTYL) 10 MG capsule Take 1 capsule (10 mg total) by mouth 3 (three) times daily as needed for up to 2 days for spasms. 6 capsule 0   . doxycycline (VIBRAMYCIN) 100 MG capsule Take 1 capsule (100 mg total) by mouth 2 (two) times daily. (Patient not taking: Reported on 12/31/2019) 28 capsule 0   . fexofenadine-pseudoephedrine (ALLEGRA-D) 60-120 MG 12 hr tablet Take 1 tablet by mouth 2 (two) times daily. (Patient not taking: Reported on 12/31/2019) 20 tablet 0   . folic acid (FOLVITE) 1 MG tablet Take 1 tablet (1 mg total) by mouth daily. (Patient not taking: Reported on 12/31/2019) 30 tablet 1   . hydrochlorothiazide (HYDRODIURIL) 25 MG tablet Take 1 tablet (25 mg total) by mouth daily. (Patient not taking: Reported on 12/31/2019) 30 tablet 1   . hydroxyprogesterone caproate (MAKENA) 250 mg/mL OIL injection Inject 1 mL (250 mg total) into the muscle once a week. On Tuesdays (Patient not taking: Reported on 12/31/2019) 0.98 mL 0   . ondansetron (ZOFRAN ODT) 8 MG disintegrating tablet Take 1 tablet (8 mg total) by mouth every 8 (eight) hours as needed for nausea or vomiting. (Patient not taking: Reported on 12/31/2019) 5 tablet 0   . pantoprazole (PROTONIX) 20 MG tablet Take 1 tablet (20 mg total) by mouth daily. 30 tablet 1   . Prenatal Vit-Fe Fumarate-FA (PRENATAL MULTIVITAMIN) TABS tablet Take 1 tablet by mouth daily at 12 noon. (Patient not taking: Reported on 12/31/2019) 30 tablet 1   . QUEtiapine (SEROQUEL) 50 MG tablet Take 1 tablet (50 mg total) by mouth at bedtime. (Patient not taking: Reported  on 03/20/2018) 30 tablet 1   . senna (SENOKOT) 8.6 MG TABS tablet Take 1 tablet (8.6 mg total) by mouth 2 (two) times daily at 10 AM and 5 PM. (Patient not taking: Reported on 03/20/2018) 60 each 1   . sertraline (ZOLOFT) 50 MG tablet Take 1 tablet (50 mg total) by mouth at bedtime. (Patient not taking: Reported on 03/20/2018) 30 tablet 1     Musculoskeletal: Strength & Muscle Tone: within normal limits Gait & Station: normal Patient leans: N/A  Psychiatric Specialty Exam: Physical Exam  Vitals reviewed. Constitutional: She appears well-developed and well-nourished.  HENT:  Head: Normocephalic and atraumatic.  Eyes: Pupils are equal, round, and reactive to light. Conjunctivae are normal.  Cardiovascular: Normal heart sounds.  Respiratory: Effort normal.  GI: Soft.  Musculoskeletal:        General: Normal range of motion.  Cervical back: Normal range of motion.  Neurological: She is alert.  Skin: Skin is warm and dry.  Psychiatric: Her speech is normal and behavior is normal. Judgment and thought content normal. Her mood appears anxious. Cognition and memory are normal. She exhibits a depressed mood.    Review of Systems  Constitutional: Negative.   HENT: Negative.   Eyes: Negative.   Respiratory: Negative.   Cardiovascular: Negative.   Gastrointestinal: Negative.   Musculoskeletal: Negative.   Skin: Negative.   Neurological: Negative.   Psychiatric/Behavioral: Positive for dysphoric mood and suicidal ideas. The patient is nervous/anxious.     Blood pressure (!) 141/73, pulse 80, temperature 98.3 F (36.8 C), temperature source Oral, resp. rate 18, height 4\' 11"  (1.499 m), weight 69.4 kg, last menstrual period 12/04/2019, SpO2 98 %.Body mass index is 30.9 kg/m.  General Appearance: Casual  Eye Contact:  Good  Speech:  Clear and Coherent  Volume:  Normal  Mood:  Euthymic  Affect:  Congruent  Thought Process:  Coherent  Orientation:  Full (Time, Place, and Person)   Thought Content:  Logical  Suicidal Thoughts:  No  Homicidal Thoughts:  No  Memory:  Immediate;   Fair Recent;   Fair Remote;   Fair  Judgement:  Fair  Insight:  Fair  Psychomotor Activity:  Normal  Concentration:  Concentration: Fair  Recall:  [employer] of Knowledge:  Fair  Language:  Fair  Akathisia:  No  Handed:  Right  AIMS (if indicated):     Assets:  Communication Skills Desire for Improvement  ADL's:  Intact  Cognition:  WNL  Sleep:  Number of Hours: 3.15    Treatment Plan Summary: Daily contact with patient to assess and evaluate symptoms and progress in treatment, Medication management and Plan Review medication possibilities.  Start fluoxetine as she found Zoloft to be unhelpful.  No indication for detox.  I have spoken with the consultant today for OB/GYN who recommends that in the current situation patient can just be safely followed up as an outpatient.  Including groups and activities for supportive and cognitive therapy.  Observation Level/Precautions:  15 minute checks  Laboratory:  Chemistry Profile  Psychotherapy:    Medications:    Consultations:    Discharge Concerns:    Estimated LOS:  Other:     Physician Treatment Plan for Primary Diagnosis: MDD (major depressive disorder) Long Term Goal(s): Improvement in symptoms so as ready for discharge  Short Term Goals: Ability to disclose and discuss suicidal ideas  Physician Treatment Plan for Secondary Diagnosis: Principal Problem:   MDD (major depressive disorder) Active Problems:   Stimulant use disorder ([place]) (cocaine)   Pregnant  Long Term Goal(s): Improvement in symptoms so as ready for discharge  Short Term Goals: Compliance with prescribed medications will improve  I certify that inpatient services furnished can reasonably be expected to improve the patient's condition.    Alethia Berthold, MD 2/8/20211:45 PM

## 2020-02-02 NOTE — [facility] Suicide Risk Assessment (Signed)
[facility] INPATIENT:  Family/Significant Other Suicide Prevention Education  Suicide Prevention Education:  Patient Refusal for Family/Significant Other Suicide Prevention Education: The patient [name] has refused to provide written consent for family/significant other to be provided Family/Significant Other Suicide Prevention Education during admission and/or prior to discharge.  Physician notified.  SPE completed with pt, as pt refused to consent to family contact. SPI pamphlet provided to pt and pt was encouraged to share information with support network, ask questions, and talk about any concerns relating to SPE. Pt denies access to guns/firearms and verbalized understanding of information provided. Mobile Crisis information also provided to pt.   [name] 02/02/2020, 2:55 PM

## 2020-02-02 NOTE — ED Notes (Signed)
[facility] has been [facility].  Patient assigned to room 309 Accepting physician is Lerry Liner, NP.  Call report to [phone]  Representative was [facility].   ER Staff is aware of it:  [facility] ER Secretary  Dr. York Cerise, ER MD  Dewayne Hatch Patient's Nurse

## 2020-02-02 NOTE — Progress Notes (Signed)
Patient ID: [name], female   DOB: [birth year], 34 y.o.   MRN: [id number] Patient presents involuntarily secondary to increased depression and suicidal ideations. Reports that she just found that she is pregnant and she did not plan for this pregnancy. Reports that she already has six living children. She is a single mother, unemployed and experiencing financial difficulties. Reports history of depression. She is a daily cigarette smoker and not ready to quit: patient decline related information. Denies health issues. Patient reports that she has no support system. Her two kids live with her while the other kids stay with relatives.  Skin assessment performed and no issues noted. Patient reports that she is tired, asking to be directed to her room so that she can sleep. P atient was admitted and oriented to the unit. Safety precautions initiated.

## 2020-02-02 NOTE — Tx Team (Signed)
Initial Treatment Plan 02/02/2020 2:57 AM [name] [name] OHF:[id number]    PATIENT STRESSORS: Financial difficulties Medication change or noncompliance Substance abuse   PATIENT STRENGTHS: Ability for insight Communication skills Physical Health   PATIENT IDENTIFIED PROBLEMS: Depression  Anxiety                   DISCHARGE CRITERIA:  Ability to meet basic life and health needs Improved stabilization in mood, thinking, and/or behavior Motivation to continue treatment in a less acute level of care  PRELIMINARY DISCHARGE PLAN: Outpatient therapy Return to previous living arrangement  PATIENT/FAMILY INVOLVEMENT: This treatment plan has been presented to and reviewed with the patient, [name]. The patient has been given the opportunity to ask questions and make suggestions.  Olin Pia, RN 02/02/2020, 2:57 AM

## 2020-02-02 NOTE — Plan of Care (Signed)
  Problem: Safety: Goal: Periods of time without injury will increase Outcome: Progressing   

## 2020-02-02 NOTE — ED Notes (Signed)
Pt given ginger ale with ice at this time; no other needs voiced. Will continue to monitor Q15 minute rounds.

## 2020-02-02 NOTE — [facility] Suicide Risk Assessment (Signed)
[facility] Admission Suicide Risk Assessment   Nursing information obtained from:  Patient Demographic factors:  Low socioeconomic status, Unemployed Current Mental Status:  NA Loss Factors:  Financial problems / change in socioeconomic status Historical Factors:  NA Risk Reduction Factors:  Pregnancy, Responsible for children under 35 years of age  Total Time spent with patient: 1 hour Principal Problem: MDD (major depressive disorder) Diagnosis:  Principal Problem:   MDD (major depressive disorder) Active Problems:   Stimulant use disorder (HCC) (cocaine)   Pregnant  Subjective Data: Patient seen chart reviewed.  34 year old woman with a history of depression mood instability and substance abuse came to the hospital reporting recent suicidal ideation.  Patient says she has been depressed and it is been getting worse for weeks.  Feels like she has no support.  Not currently getting any outpatient treatment.  Has been off of medicine for several months.  Patient denies any psychotic symptoms.  She is currently pregnant has not been getting any treatment yet.  Patient admits to having used some cocaine recently.  Continued Clinical Symptoms:  Alcohol Use Disorder Identification Test Final Score (AUDIT): 0 The "Alcohol Use Disorders Identification Test", Guidelines for Use in Primary Care, Second Edition.  World Pharmacologist [facility]). Score between 0-7:  no or low risk or alcohol related problems. Score between 8-15:  moderate risk of alcohol related problems. Score between 16-19:  high risk of alcohol related problems. Score 20 or above:  warrants further diagnostic evaluation for alcohol dependence and treatment.   CLINICAL FACTORS:   Depression:   Comorbid alcohol abuse/dependence   Musculoskeletal: Strength & Muscle Tone: within normal limits Gait & Station: normal Patient leans: N/A  Psychiatric Specialty Exam: Physical Exam  Nursing note and vitals reviewed. Constitutional:  She appears well-developed and well-nourished.  HENT:  Head: Normocephalic and atraumatic.  Eyes: Pupils are equal, round, and reactive to light. Conjunctivae are normal.  Cardiovascular: Regular rhythm and normal heart sounds.  Respiratory: Effort normal.  GI: Soft.  Musculoskeletal:        General: Normal range of motion.     Cervical back: Normal range of motion.  Neurological: She is alert.  Skin: Skin is warm and dry.  Psychiatric: Judgment normal. Her mood appears anxious. Her speech is delayed. She is slowed. Cognition and memory are normal. She exhibits a depressed mood. She expresses no homicidal and no suicidal ideation.    Review of Systems  Constitutional: Negative.   HENT: Negative.   Eyes: Negative.   Respiratory: Negative.   Cardiovascular: Negative.   Gastrointestinal: Negative.   Musculoskeletal: Negative.   Skin: Negative.   Neurological: Negative.   Psychiatric/Behavioral: Positive for dysphoric mood. The patient is nervous/anxious.     Blood pressure (!) 141/73, pulse 80, temperature 98.3 F (36.8 C), temperature source Oral, resp. rate 18, height 4\' 11"  (1.499 m), weight 69.4 kg, last menstrual period 12/04/2019, SpO2 98 %.Body mass index is 30.9 kg/m.  General Appearance: Casual  Eye Contact:  Good  Speech:  Clear and Coherent  Volume:  Normal  Mood:  Euthymic  Affect:  Congruent  Thought Process:  Coherent  Orientation:  Full (Time, Place, and Person)  Thought Content:  Logical  Suicidal Thoughts:  No  Homicidal Thoughts:  No  Memory:  Immediate;   Fair Recent;   Fair Remote;   Fair  Judgement:  Fair  Insight:  Fair  Psychomotor Activity:  Normal  Concentration:  Concentration: Fair  Recall:  [employer] of  Knowledge:  Fair  Language:  Fair  Akathisia:  No  Handed:  Right  AIMS (if indicated):     Assets:  Desire for Improvement Housing Resilience  ADL's:  Impaired  Cognition:  WNL  Sleep:  Number of Hours: 3.15      COGNITIVE  FEATURES THAT CONTRIBUTE TO RISK:  None    SUICIDE RISK:   Mild:  Suicidal ideation of limited frequency, intensity, duration, and specificity.  There are no identifiable plans, no associated intent, mild dysphoria and related symptoms, good self-control (both objective and subjective assessment), few other risk factors, and identifiable protective factors, including available and accessible social support.  PLAN OF CARE: Continue 15-minute checks.  Start medication for depression.  Including group therapy for psychoeducation cognitive therapy and support.  Treatment team will meet with patient and we will work on appropriate discharge plans.  I certify that inpatient services furnished can reasonably be expected to improve the patient's condition.   Mordecai Rasmussen, MD 02/02/2020, 1:41 PM

## 2020-02-02 NOTE — [facility] Counselor (Signed)
CSW attempted to complete the patient's PSA.  Pt declined to participate.  CSW will attempt again.  Penni Homans, MSW, LCSW 02/02/2020 9:38 AM

## 2020-02-02 NOTE — Plan of Care (Signed)
Newly admitted and adjusting well. Currently in bed sleeping. No sign of distress.

## 2020-02-03 MED ORDER — VITAMIN B-6 50 MG PO TABS
25.0000 mg | ORAL_TABLET | Freq: Every day | ORAL | Status: DC
  Administered 2020-02-03 – 2020-02-06 (×4): 25 mg via ORAL
  Filled 2020-02-03 (×4): qty 0.5

## 2020-02-03 NOTE — Plan of Care (Signed)
D- Patient alert and oriented. Patient presents in a pleasant mood on assessment stating that she didn't sleep well last night because her mind was racing. Patient rated her back pain a "10/10", in which she did request pain medication from this Clinical research associate. Patient denies depression/anxiety to this writer, however, she rated them an "8/10" and "6/10" on her self-inventory. Patient also denies SI, HI, AVH, at this time. Patient had no stated goals for today.  A- Scheduled medications administered to patient, per MD orders. Support and encouragement provided.  Routine safety checks conducted every 15 minutes.  Patient informed to notify staff with problems or concerns.  R- No adverse drug reactions noted. Patient contracts for safety at this time. Patient compliant with medications and treatment plan. Patient receptive, calm, and cooperative. Patient interacts well with others on the unit.  Patient remains safe at this time.  Problem: Education: Goal: Ability to state activities that reduce stress will improve Outcome: Progressing   Problem: Education: Goal: Knowledge of [facility] Education information/materials will improve Outcome: Progressing Goal: Emotional status will improve Outcome: Progressing Goal: Mental status will improve Outcome: Progressing Goal: Verbalization of understanding the information provided will improve Outcome: Progressing   Problem: Activity: Goal: Interest or engagement in activities will improve Outcome: Progressing Goal: Sleeping patterns will improve Outcome: Progressing   Problem: Coping: Goal: Ability to verbalize frustrations and anger appropriately will improve Outcome: Progressing Goal: Ability to demonstrate self-control will improve Outcome: Progressing   Problem: Health Behavior/Discharge Planning: Goal: Compliance with treatment plan for underlying cause of condition will improve Outcome: Progressing   Problem: Physical  Regulation: Goal: Ability to maintain clinical measurements within normal limits will improve Outcome: Progressing   Problem: Safety: Goal: Periods of time without injury will increase Outcome: Progressing   Problem: Education: Goal: Utilization of techniques to improve thought processes will improve Outcome: Progressing Goal: Knowledge of the prescribed therapeutic regimen will improve Outcome: Progressing   Problem: Coping: Goal: Coping ability will improve Outcome: Progressing   Problem: Coping: Goal: Coping ability will improve Outcome: Progressing   Problem: Health Behavior/Discharge Planning: Goal: Identification of resources available to assist in meeting health care needs will improve Outcome: Progressing   Problem: Medication: Goal: Compliance with prescribed medication regimen will improve Outcome: Progressing   Problem: Self-Concept: Goal: Ability to disclose and discuss suicidal ideas will improve Outcome: Progressing

## 2020-02-03 NOTE — Progress Notes (Signed)
   02/03/20 1600  Clinical Encounter Type  Visited With Patient;Other (Comment)  Visit Type Spiritual support;Social support;Behavioral Health  Referral From Chaplain  Consult/Referral To Chaplain  Patient participated in group on serenity. Patient often gave input.

## 2020-02-03 NOTE — Tx Team (Signed)
Interdisciplinary Treatment and Diagnostic Plan Update  02/03/2020 Time of Session: 9:00AM [name] MRN: [id number]  Principal Diagnosis: MDD (major depressive disorder)  Secondary Diagnoses: Principal Problem:   MDD (major depressive disorder) Active Problems:   Stimulant use disorder ([place]) (cocaine)   Pregnant   Current Medications:  Current Facility-Administered Medications  Medication Dose Route Frequency Provider Last Rate Last Admin  . acetaminophen (TYLENOL) tablet 1,000 mg  1,000 mg Oral Q6H PRN Clapacs, Madie Reno, MD   1,000 mg at 02/03/20 0810  . FLUoxetine (PROZAC) capsule 20 mg  20 mg Oral Daily Clapacs, Madie Reno, MD   20 mg at 02/03/20 0810  . prenatal multivitamin tablet 1 tablet  1 tablet Oral Q1200 Deloria Lair, NP   1 tablet at 02/03/20 0810   PTA Medications: Medications Prior to Admission  Medication Sig Dispense Refill Last Dose  . albuterol (PROVENTIL HFA;VENTOLIN HFA) 108 (90 Base) MCG/ACT inhaler Inhale 2 puffs into the lungs every 6 (six) hours as needed for wheezing or shortness of breath. (Patient not taking: Reported on 03/20/2018) 1 Inhaler 0   . dicyclomine (BENTYL) 10 MG capsule Take 1 capsule (10 mg total) by mouth 3 (three) times daily as needed for up to 2 days for spasms. 6 capsule 0   . doxycycline (VIBRAMYCIN) 100 MG capsule Take 1 capsule (100 mg total) by mouth 2 (two) times daily. (Patient not taking: Reported on 12/31/2019) 28 capsule 0   . fexofenadine-pseudoephedrine (ALLEGRA-D) 60-120 MG 12 hr tablet Take 1 tablet by mouth 2 (two) times daily. (Patient not taking: Reported on 12/31/2019) 20 tablet 0   . folic acid (FOLVITE) 1 MG tablet Take 1 tablet (1 mg total) by mouth daily. (Patient not taking: Reported on 12/31/2019) 30 tablet 1   . hydrochlorothiazide (HYDRODIURIL) 25 MG tablet Take 1 tablet (25 mg total) by mouth daily. (Patient not taking: Reported on 12/31/2019) 30 tablet 1   . hydroxyprogesterone caproate (MAKENA) 250 mg/mL OIL  injection Inject 1 mL (250 mg total) into the muscle once a week. On Tuesdays (Patient not taking: Reported on 12/31/2019) 0.98 mL 0   . ondansetron (ZOFRAN ODT) 8 MG disintegrating tablet Take 1 tablet (8 mg total) by mouth every 8 (eight) hours as needed for nausea or vomiting. (Patient not taking: Reported on 12/31/2019) 5 tablet 0   . pantoprazole (PROTONIX) 20 MG tablet Take 1 tablet (20 mg total) by mouth daily. 30 tablet 1   . Prenatal Vit-Fe Fumarate-FA (PRENATAL MULTIVITAMIN) TABS tablet Take 1 tablet by mouth daily at 12 noon. (Patient not taking: Reported on 12/31/2019) 30 tablet 1   . QUEtiapine (SEROQUEL) 50 MG tablet Take 1 tablet (50 mg total) by mouth at bedtime. (Patient not taking: Reported on 03/20/2018) 30 tablet 1   . senna (SENOKOT) 8.6 MG TABS tablet Take 1 tablet (8.6 mg total) by mouth 2 (two) times daily at 10 AM and 5 PM. (Patient not taking: Reported on 03/20/2018) 60 each 1   . sertraline (ZOLOFT) 50 MG tablet Take 1 tablet (50 mg total) by mouth at bedtime. (Patient not taking: Reported on 03/20/2018) 30 tablet 1     Patient Stressors: Financial difficulties Medication change or noncompliance Substance abuse  Patient Strengths: Ability for insight Communication skills Physical Health  Treatment Modalities: Medication Management, Group therapy, Case management,  1 to 1 session with clinician, Psychoeducation, Recreational therapy.   Physician Treatment Plan for Primary Diagnosis: MDD (major depressive disorder) Long Term Goal(s): Improvement in symptoms  so as ready for discharge Improvement in symptoms so as ready for discharge   Short Term Goals: Ability to disclose and discuss suicidal ideas Compliance with prescribed medications will improve  Medication Management: Evaluate patient's response, side effects, and tolerance of medication regimen.  Therapeutic Interventions: 1 to 1 sessions, Unit Group sessions and Medication administration.  Evaluation of Outcomes:  Progressing  Physician Treatment Plan for Secondary Diagnosis: Principal Problem:   MDD (major depressive disorder) Active Problems:   Stimulant use disorder (HCC) (cocaine)   Pregnant  Long Term Goal(s): Improvement in symptoms so as ready for discharge Improvement in symptoms so as ready for discharge   Short Term Goals: Ability to disclose and discuss suicidal ideas Compliance with prescribed medications will improve     Medication Management: Evaluate patient's response, side effects, and tolerance of medication regimen.  Therapeutic Interventions: 1 to 1 sessions, Unit Group sessions and Medication administration.  Evaluation of Outcomes: Progressing   RN Treatment Plan for Primary Diagnosis: MDD (major depressive disorder) Long Term Goal(s): Knowledge of disease and therapeutic regimen to maintain health will improve  Short Term Goals: Ability to verbalize frustration and anger appropriately will improve, Ability to demonstrate self-control, Ability to participate in decision making will improve, Ability to verbalize feelings will improve, Ability to disclose and discuss suicidal ideas, Ability to identify and develop effective coping behaviors will improve and Compliance with prescribed medications will improve  Medication Management: RN will administer medications as ordered by provider, will assess and evaluate patient's response and provide education to patient for prescribed medication. RN will report any adverse and/or side effects to prescribing provider.  Therapeutic Interventions: 1 on 1 counseling sessions, Psychoeducation, Medication administration, Evaluate responses to treatment, Monitor vital signs and CBGs as ordered, Perform/monitor CIWA, COWS, AIMS and Fall Risk screenings as ordered, Perform wound care treatments as ordered.  Evaluation of Outcomes: Progressing   LCSW Treatment Plan for Primary Diagnosis: MDD (major depressive disorder) Long Term Goal(s):  Safe transition to appropriate next level of care at discharge, Engage patient in therapeutic group addressing interpersonal concerns.  Short Term Goals: Engage patient in aftercare planning with referrals and resources, Increase social support, Increase ability to appropriately verbalize feelings, Increase emotional regulation, Facilitate acceptance of mental health diagnosis and concerns, Facilitate patient progression through stages of change regarding substance use diagnoses and concerns, Identify triggers associated with mental health/substance abuse issues and Increase skills for wellness and recovery  Therapeutic Interventions: Assess for all discharge needs, 1 to 1 time with Social worker, Explore available resources and support systems, Assess for adequacy in community support network, Educate family and significant other(s) on suicide prevention, Complete Psychosocial Assessment, Interpersonal group therapy.  Evaluation of Outcomes: Progressing   Progress in Treatment: Attending groups: Yes. Participating in groups: Yes. Taking medication as prescribed: Yes. Toleration medication: Yes. Family/Significant other contact made: No, will contact:  pt declined family SPE. Patient understands diagnosis: Yes. Discussing patient identified problems/goals with staff: Yes. Medical problems stabilized or resolved: Yes. Denies suicidal/homicidal ideation: Yes. Issues/concerns per patient self-inventory: No. Other: none  New problem(s) identified: No, Describe:  none  New Short Term/Long Term Goal(s): medication management for mood stabilization; elimination of SI thoughts; development of comprehensive mental wellness/sobriety plan.  Patient Goals:  "work on finding other ways when I get triggered".  Discharge Plan or Barriers: Patient reports plans to be discharged to her home.  Patient at this time has not been able to identify an aftercare provider.  Those discussed by CSW and  the one  provider the patient currently sees has been denied by the patient.  CSW will continue to assess. Pt may need referral for OBGYN to follow up with for prenatal care.   Reason for Continuation of Hospitalization: Anxiety Depression Medical Issues Medication stabilization Suicidal ideation  Estimated Length of Stay:  1-7 days  Attendees: Patient:  Roby Spalla 02/03/2020 9:58 AM  Physician: Dr. Toni Amend, MD 02/03/2020 9:58 AM  Nursing:  02/03/2020 9:58 AM  RN Care Manager: 02/03/2020 9:58 AM  Social Worker: Penni Homans, LCSW 02/03/2020 9:58 AM  Recreational Therapist:  02/03/2020 9:58 AM  Other: Lowella Dandy, LCSW 02/03/2020 9:58 AM  Other: Iris Pert, LCSW 02/03/2020 9:58 AM  Other: 02/03/2020 9:58 AM    Scribe for Treatment Team: [name], LCSW 02/03/2020 9:58 AM

## 2020-02-03 NOTE — [facility] Group Notes (Signed)
Overcoming Obstacles  02/03/2020 1PM  Type of Therapy and Topic:  Group Therapy:  Overcoming Obstacles  Participation Level:  Active    Description of Group:    In this group patients will be encouraged to explore what they see as obstacles to their own wellness and recovery. They will be guided to discuss their thoughts, feelings, and behaviors related to these obstacles. The group will process together ways to cope with barriers, with attention given to specific choices patients can make. Each patient will be challenged to identify changes they are motivated to make in order to overcome their obstacles. This group will be process-oriented, with patients participating in exploration of their own experiences as well as giving and receiving support and challenge from other group members.   Therapeutic Goals: 1. Patient will identify personal and current obstacles as they relate to admission. 2. Patient will identify barriers that currently interfere with their wellness or overcoming obstacles.  3. Patient will identify feelings, thought process and behaviors related to these barriers. 4. Patient will identify two changes they are willing to make to overcome these obstacles:      Summary of Patient Progress Actively and appropriately participated in session. Pt discussed having an unhealthy relationship with her child's father. Group members assisted her in developing ways to establish healthy boundary in the relationship. Pt receptive to feedback from group members.    Therapeutic Modalities:   Cognitive Behavioral Therapy Solution Focused Therapy Motivational Interviewing Relapse Prevention Therapy    Lowella Dandy, MSW, LCSW 02/03/2020 2:08 PM

## 2020-02-03 NOTE — Progress Notes (Signed)
[facility] MD Progress Note  02/03/2020 1:36 PM [name]  MRN:  [id number] Subjective:  34 year old female with a history of substance abuse, recurrent depression. She was admitted to the hospital for worsening depression and passive suicidal ideations. Today she states she is feeling so-so. " I am having some really vivid dreams at night and it is disturbing my sleep. Also I dont feel so good so I have been in my room some of the day. "  Objective: Today during the evaluation, patient is observed in the dayroom interacting well with her peers. SHe has been in the dayroom most of the day interacting well with her peers. At this time she states she has some ongoing anxiety and currently rates it 8/10 with 10 being the worse. She reports her anxiety as a little bit yet rates it  8/10, clarification is sought and patient continues to rate her anxiety 8/10. We discussed attending groups to help with coping skills and behavior modifications to help reduce anxiety and depression. Despite her having depression on admission she states she has no symptoms of depression at this time, rating it 0/10. She also minimizes her drug use at this time, stating she was not using any illegal substances prior to this admission. When assessing for sleep she reports she is having vivid dreams at night time. SHe is advised this is common when you are withdrawing. She is currently on Prozac 20mg  po daily for depression, which she is tolerating well. She is reminded that Prozac can cause n/v, however keep in mind she is pregnant.The patient denies ongoing suicidal ideations..    Principal Problem: MDD (major depressive disorder) Diagnosis: Principal Problem:   MDD (major depressive disorder) Active Problems:   Stimulant use disorder (HCC) (cocaine)   Pregnant  Total Time spent with patient: 20 minutes  Past Psychiatric History: Patient has a history of prior psychiatric admission and treatment for similar situations.  Has  been treated with Zoloft and Seroquel in the past.  She tells me that she thinks Zoloft has not actually been helpful for her.  She says even at higher doses she never found it to be useful.  We reviewed her old chart and saw that she had also been prescribed Celexa in the past.  Patient has a distant history of an actual suicide attempt but more recently has only had some cutting as an adult.  Past Medical History:  Past Medical History:  Diagnosis Date  . Anxiety   . Depression   . History of substance abuse (HCC)   . History of suicide attempt   . History of thyroid disease   . Hypertension   . Thyroid disease     Past Surgical History:  Procedure Laterality Date  . CESAREAN SECTION  2000, 2002, 2013   Family History:  Family History  Problem Relation Age of Onset  . Diabetes Mother    Family Psychiatric  History: None reported Social History:  Social History   Substance and Sexual Activity  Alcohol Use Not Currently   Comment: social drinker     Social History   Substance and Sexual Activity  Drug Use Not Currently  . Types: Cocaine, Marijuana   Comment: Positive UDS 12/2017    Social History   Socioeconomic History  . Marital status: Single    Spouse name: Not on file  . Number of children: Not on file  . Years of education: Not on file  . Highest education level: Not  on file  Occupational History  . Not on file  Tobacco Use  . Smoking status: Current Every Day Smoker    Packs/day: 0.50  . Smokeless tobacco: Never Used  Substance and Sexual Activity  . Alcohol use: Not Currently    Comment: social drinker  . Drug use: Not Currently    Types: Cocaine, Marijuana    Comment: Positive UDS 12/2017  . Sexual activity: Not Currently    Birth control/protection: None  Other Topics Concern  . Not on file  Social History Narrative  . Not on file   Social Determinants of Health   Financial Resource Strain:   . Difficulty of Paying Living Expenses: Not on  file  Food Insecurity:   . Worried About Charity fundraiser in the Last Year: Not on file  . Ran Out of Food in the Last Year: Not on file  Transportation Needs:   . Lack of Transportation (Medical): Not on file  . Lack of Transportation (Non-Medical): Not on file  Physical Activity:   . Days of Exercise per Week: Not on file  . Minutes of Exercise per Session: Not on file  Stress:   . Feeling of Stress : Not on file  Social Connections:   . Frequency of Communication with Friends and Family: Not on file  . Frequency of Social Gatherings with Friends and Family: Not on file  . Attends Religious Services: Not on file  . Active Member of Clubs or Organizations: Not on file  . Attends Archivist Meetings: Not on file  . Marital Status: Not on file   Additional Social History:                         Sleep: Fair  Appetite:  Fair  Current Medications: Current Facility-Administered Medications  Medication Dose Route Frequency Provider Last Rate Last Admin  . acetaminophen (TYLENOL) tablet 1,000 mg  1,000 mg Oral Q6H PRN Clapacs, Madie Reno, MD   1,000 mg at 02/03/20 0810  . FLUoxetine (PROZAC) capsule 20 mg  20 mg Oral Daily Clapacs, Madie Reno, MD   20 mg at 02/03/20 0810  . prenatal multivitamin tablet 1 tablet  1 tablet Oral Q1200 Deloria Lair, NP   1 tablet at 02/03/20 0272    Lab Results:  Results for orders placed or performed during the hospital encounter of 02/02/20 (from the past 48 hour(s))  hCG, quantitative, pregnancy     Status: Abnormal   Collection Time: 02/01/20 10:05 PM  Result Value Ref Range   hCG, Beta Chain, Quant, S 48,584 (H) <5 mIU/mL    Comment:          GEST. AGE      CONC.  (mIU/mL)   <=1 WEEK        5 - 50     2 WEEKS       50 - 500     3 WEEKS       100 - 10,000     4 WEEKS     1,000 - 30,000     5 WEEKS     3,500 - 115,000   6-8 WEEKS     12,000 - 270,000    12 WEEKS     15,000 - 220,000        FEMALE AND NON-PREGNANT FEMALE:      LESS THAN 5 mIU/mL Performed at [facility], [place], [place]  [address]     Blood Alcohol level:  Lab Results  Component Value Date   ETH <10 02/01/2020   ETH 129 (H) 01/07/2018    Metabolic Disorder Labs: Lab Results  Component Value Date   HGBA1C 5.1 01/02/2018   MPG 99.67 01/02/2018   No results found for: PROLACTIN Lab Results  Component Value Date   CHOL 218 (H) 01/02/2018   TRIG 187 (H) 01/02/2018   HDL 81 01/02/2018   CHOLHDL 2.7 01/02/2018   VLDL 37 01/02/2018   LDLCALC 100 (H) 01/02/2018   LDLCALC 82 01/12/2016    Physical Findings: AIMS:  , ,  ,  ,    CIWA:    COWS:     Musculoskeletal: Strength & Muscle Tone: within normal limits Gait & Station: normal Patient leans: N/A  Psychiatric Specialty Exam: Physical Exam  Nursing note and vitals reviewed. Constitutional: She appears well-developed and well-nourished.  HENT:  Head: Normocephalic.  Eyes: Pupils are equal, round, and reactive to light.  Musculoskeletal:        General: Normal range of motion.  Neurological: She is alert.  Skin: Skin is warm.  Psychiatric: She has a normal mood and affect. Her behavior is normal. Judgment and thought content normal.  Anxiety    Review of Systems  Psychiatric/Behavioral: Positive for behavioral problems. Confusion: anxiety 8/10.  All other systems reviewed and are negative.   Blood pressure (!) 156/100, pulse 77, temperature 98.6 F (37 C), temperature source Oral, resp. rate 18, height 4\' 11"  (1.499 m), weight 69.4 kg, last menstrual period 12/04/2019, SpO2 100 %.Body mass index is 30.9 kg/m.  General Appearance: Fairly Groomed  Eye Contact:  Fair  Speech:  Clear and Coherent and Normal Rate  Volume:  Normal  Mood:  Anxious  Affect:  Congruent  Thought Process:  Linear and Descriptions of Associations: Intact  Orientation:  Full (Time, Place, and Person)  Thought Content:  Logical  Suicidal Thoughts:  No  Homicidal  Thoughts:  No  Memory:  Immediate;   Fair Recent;   Fair  Judgement:  Fair  Insight:  Shallow  Psychomotor Activity:  Normal  Concentration:  Concentration: Fair and Attention Span: Fair  Recall:  Poor  Fund of Knowledge:  Fair  Language:  Fair  Akathisia:  No  Handed:  Right  AIMS (if indicated):     Assets:  Communication Skills Desire for Improvement Leisure Time Physical Health  ADL's:  Intact  Cognition:  WNL  Sleep:  Number of Hours: 8.25     Treatment Plan Summary: Daily contact with patient to assess and evaluate symptoms and progress in treatment, Medication management and Plan COntinue with prozac daily for depression. Patient is [redacted]wks pregnant with symptoms of nausea and vomiting, will be careful not to associate this with initiation of prozac. Will treat n/v with b6 and ginger ale at this time. Patient denies illegal substances however UDS positive for Cociaine, will decrease polypharmacy at this time during first trimerster.   14/09/2019, FNP 02/03/2020, 1:36 PM

## 2020-02-03 NOTE — Progress Notes (Signed)
This writer assumed care of patient at 0300, and patient has been asleep, no issues or complaints reported. Patient remains safe on the unit. 

## 2020-02-04 MED ORDER — DICLOFENAC SODIUM 1 % EX GEL
2.0000 g | Freq: Two times a day (BID) | CUTANEOUS | Status: DC
  Administered 2020-02-04 – 2020-02-05 (×2): 2 g via TOPICAL
  Filled 2020-02-04: qty 100

## 2020-02-04 NOTE — Plan of Care (Signed)
Patient agitated with staff on the unit this evening, minimal as well.  Problem: Education: Goal: Emotional status will improve Outcome: Not Progressing Goal: Mental status will improve Outcome: Not Progressing

## 2020-02-04 NOTE — Plan of Care (Signed)
Patient more pleasant with staff this evening. No complaints from the patient tonight.  Problem: Education: Goal: Emotional status will improve Outcome: Progressing Goal: Mental status will improve Outcome: Progressing

## 2020-02-04 NOTE — Progress Notes (Signed)
Patient was in the day room upon arrival to the unit. Patient more compliant on the unit this evening. Patient denies SI/HI/AVH and anxiety with depression. Patient didn't have any medications scheduled this evening and didn't request anything PRN. Patient given education. 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 Patient being monitored Q 15 minutes for safety per unit protocol. Patient remains safe on the unit.

## 2020-02-04 NOTE — Progress Notes (Signed)
[facility] MD Progress Note  02/04/2020 3:40 PM Prescious Hurless Mejia  MRN:  [id number] Subjective: Patient seen chart reviewed.  This is a follow-up for this 34 year old woman with recurrent depression and irritability mood instability and substance abuse problems.  Patient says she is feeling better today.  She refused to take her antidepressant medicine today but is taking her vitamins and seems to be trying to take care of her health.  She does get up and attend a few groups.  We have tried to follow-up with her about discharge planning and today she told me that she has reconsidered and will agreed to go to [facility] for outpatient treatment.  She is denying any suicidal ideation at this point and is taking care of her hygiene okay. Principal Problem: MDD (major depressive disorder) Diagnosis: Principal Problem:   MDD (major depressive disorder) Active Problems:   Stimulant use disorder ([place]) (cocaine)   Pregnant  Total Time spent with patient: 30 minutes  Past Psychiatric History:  History of recurrent mood instability anger problems and depression  Past Medical History:  Past Medical History:  Diagnosis Date  . Anxiety   . Depression   . History of substance abuse ([place])   . History of suicide attempt   . History of thyroid disease   . Hypertension   . Thyroid disease     Past Surgical History:  Procedure Laterality Date  . CESAREAN SECTION  2000, 2002, 2013   Family History:  Family History  Problem Relation Age of Onset  . Diabetes Mother    Family Psychiatric  History: See previous Social History:  Social History   Substance and Sexual Activity  Alcohol Use Not Currently   Comment: social drinker     Social History   Substance and Sexual Activity  Drug Use Not Currently  . Types: Cocaine, Marijuana   Comment: Positive UDS 12/2017    Social History   Socioeconomic History  . Marital status: Single    Spouse name: Not on file  . Number of children: Not on file  . Years  of education: Not on file  . Highest education level: Not on file  Occupational History  . Not on file  Tobacco Use  . Smoking status: Current Every Day Smoker    Packs/day: 0.50  . Smokeless tobacco: Never Used  Substance and Sexual Activity  . Alcohol use: Not Currently    Comment: social drinker  . Drug use: Not Currently    Types: Cocaine, Marijuana    Comment: Positive UDS 12/2017  . Sexual activity: Not Currently    Birth control/protection: None  Other Topics Concern  . Not on file  Social History Narrative  . Not on file   Social Determinants of Health   Financial Resource Strain:   . Difficulty of Paying Living Expenses: Not on file  Food Insecurity:   . Worried About Charity fundraiser in the Last Year: Not on file  . Ran Out of Food in the Last Year: Not on file  Transportation Needs:   . Lack of Transportation (Medical): Not on file  . Lack of Transportation (Non-Medical): Not on file  Physical Activity:   . Days of Exercise per Week: Not on file  . Minutes of Exercise per Session: Not on file  Stress:   . Feeling of Stress : Not on file  Social Connections:   . Frequency of Communication with Friends and Family: Not on file  . Frequency of Social Gatherings  with Friends and Family: Not on file  . Attends Religious Services: Not on file  . Active Member of Clubs or Organizations: Not on file  . Attends Banker Meetings: Not on file  . Marital Status: Not on file   Additional Social History:                         Sleep: Fair  Appetite:  Fair  Current Medications: Current Facility-Administered Medications  Medication Dose Route Frequency Provider Last Rate Last Admin  . acetaminophen (TYLENOL) tablet 1,000 mg  1,000 mg Oral Q6H PRN Cece Milhouse, Jackquline Denmark, MD   1,000 mg at 02/04/20 1458  . diclofenac Sodium (VOLTAREN) 1 % topical gel 2 g  2 g Topical BID Kimia Finan T, MD      . FLUoxetine (PROZAC) capsule 20 mg  20 mg Oral Daily  Yuan Gann, Jackquline Denmark, MD   20 mg at 02/03/20 0810  . prenatal multivitamin tablet 1 tablet  1 tablet Oral Q1200 Jearld Lesch, NP   1 tablet at 02/04/20 0808  . pyridOXINE (VITAMIN B-6) tablet 25 mg  25 mg Oral Daily Maryagnes Amos, FNP   25 mg at 02/04/20 2979    Lab Results: No results found for this or any previous visit (from the past 48 hour(s)).  Blood Alcohol level:  Lab Results  Component Value Date   ETH <10 02/01/2020   ETH 129 (H) 01/07/2018    Metabolic Disorder Labs: Lab Results  Component Value Date   HGBA1C 5.1 01/02/2018   MPG 99.67 01/02/2018   No results found for: PROLACTIN Lab Results  Component Value Date   CHOL 218 (H) 01/02/2018   TRIG 187 (H) 01/02/2018   HDL 81 01/02/2018   CHOLHDL 2.7 01/02/2018   VLDL 37 01/02/2018   LDLCALC 100 (H) 01/02/2018   LDLCALC 82 01/12/2016    Physical Findings: AIMS:  , ,  ,  ,    CIWA:    COWS:     Musculoskeletal: Strength & Muscle Tone: within normal limits Gait & Station: normal Patient leans: N/A  Psychiatric Specialty Exam: Physical Exam  Nursing note and vitals reviewed. Constitutional: She appears well-developed and well-nourished.  HENT:  Head: Normocephalic and atraumatic.  Eyes: Pupils are equal, round, and reactive to light. Conjunctivae are normal.  Cardiovascular: Regular rhythm and normal heart sounds.  Respiratory: Effort normal.  GI: Soft.  Musculoskeletal:        General: Normal range of motion.     Cervical back: Normal range of motion.  Neurological: She is alert.  Skin: Skin is warm and dry.  Psychiatric: She has a normal mood and affect. Her speech is delayed. She is slowed. Cognition and memory are impaired. She expresses impulsivity. She expresses no homicidal and no suicidal ideation.    Review of Systems  Constitutional: Negative.   HENT: Negative.   Eyes: Negative.   Respiratory: Negative.   Cardiovascular: Negative.   Gastrointestinal: Negative.   Musculoskeletal:  Negative.   Skin: Negative.   Neurological: Negative.   Psychiatric/Behavioral: Positive for dysphoric mood.    Blood pressure (!) 156/100, pulse 77, temperature 98.6 F (37 C), temperature source Oral, resp. rate 18, height 4\' 11"  (1.499 m), weight 69.4 kg, last menstrual period 12/04/2019, SpO2 100 %.Body mass index is 30.9 kg/m.  General Appearance: Disheveled  Eye Contact:  Fair  Speech:  Slow  Volume:  Decreased  Mood:  Dysphoric  Affect:  Constricted  Thought Process:  Goal Directed  Orientation:  Full (Time, Place, and Person)  Thought Content:  Logical  Suicidal Thoughts:  No  Homicidal Thoughts:  No  Memory:  Immediate;   Fair Recent;   Fair Remote;   Fair  Judgement:  Fair  Insight:  Fair  Psychomotor Activity:  Normal  Concentration:  Concentration: Fair  Recall:  [employer] of Knowledge:  Fair  Language:  Fair  Akathisia:  No  Handed:  Right  AIMS (if indicated):     Assets:  Desire for Improvement Resilience  ADL's:  Intact  Cognition:  WNL  Sleep:  Number of Hours: 7.5     Treatment Plan Summary: Plan Spoke with patient about medicine.  If she does not want to take antidepressants I would not force them on her but discussed the pros and cons of medication use based on her past history.  Strongly support her decision to actually follow-up with outpatient mental health treatment.  Patient was requesting some kind of topical pain relief for her hips and Voltaren is all we have so I have prescribed that.  Likely discharge 1 to 2 days.  Mordecai Rasmussen, MD 02/04/2020, 3:40 PM

## 2020-02-04 NOTE — Plan of Care (Signed)
Pt denies depression, anxiety, SI, HI and AVH. Pt was educated on care plan and verbalizes understanding. Pt was encouraged to attend groups. Torrie Mayers RN Problem: Education: Goal: Ability to state activities that reduce stress will improve Outcome: Progressing   Problem: Education: Goal: Knowledge of [facility] Education information/materials will improve Outcome: Progressing Goal: Emotional status will improve Outcome: Progressing Goal: Mental status will improve Outcome: Progressing Goal: Verbalization of understanding the information provided will improve Outcome: Progressing   Problem: Education: Goal: Ability to state activities that reduce stress will improve Outcome: Progressing   Problem: Education: Goal: Knowledge of  General Education information/materials will improve Outcome: Progressing Goal: Emotional status will improve Outcome: Progressing Goal: Mental status will improve Outcome: Progressing Goal: Verbalization of understanding the information provided will improve Outcome: Progressing   Problem: Activity: Goal: Interest or engagement in activities will improve Outcome: Progressing Goal: Sleeping patterns will improve Outcome: Progressing   Problem: Coping: Goal: Ability to verbalize frustrations and anger appropriately will improve Outcome: Progressing Goal: Ability to demonstrate self-control will improve Outcome: Progressing   Problem: Health Behavior/Discharge Planning: Goal: Compliance with treatment plan for underlying cause of condition will improve Outcome: Not Progressing   Problem: Physical Regulation: Goal: Ability to maintain clinical measurements within normal limits will improve Outcome: Progressing   Problem: Safety: Goal: Periods of time without injury will increase Outcome: Progressing   Problem: Education: Goal: Utilization of techniques to improve thought processes will improve Outcome:  Progressing Goal: Knowledge of the prescribed therapeutic regimen will improve Outcome: Progressing   Problem: Coping: Goal: Coping ability will improve Outcome: Progressing   Problem: Coping: Goal: Coping ability will improve Outcome: Progressing   Problem: Health Behavior/Discharge Planning: Goal: Identification of resources available to assist in meeting health care needs will improve Outcome: Progressing   Problem: Medication: Goal: Compliance with prescribed medication regimen will improve Outcome: Progressing   Problem: Self-Concept: Goal: Ability to disclose and discuss suicidal ideas will improve Outcome: Progressing

## 2020-02-04 NOTE — Progress Notes (Signed)
Recreation Therapy Notes   Date: 02/04/2020  Time: 9:30 am   Location: Craft room   Behavioral response: N/A   Intervention Topic: Relaxation   Discussion/Intervention: Patient did not attend group.   Clinical Observations/Feedback:  Patient did not attend group.   Keeva Reisen LRT/CTRS        Deo Mehringer 02/04/2020 11:17 AM 

## 2020-02-04 NOTE — Progress Notes (Signed)
Patient was in the day room upon arrival to the unit. Patient agitated during assessment and didn't really answer assessment questions but did state she felt safe and didn't have any SI. Patient didn't have any medications scheduled this evening and didn't request anything PRN. Patient given education. 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 Patient being monitored Q 15 minutes for safety per unit protocol. Patient remains safe on the unit.

## 2020-02-04 NOTE — [facility] Group Notes (Signed)
LCSW Group Therapy Note  02/04/2020 11:50 AM  Type of Therapy/Topic:  Group Therapy:  Emotion Regulation  Participation Level:  Did Not Attend   Description of Group:   The purpose of this group is to assist patients in learning to regulate negative emotions and experience positive emotions. Patients will be guided to discuss ways in which they have been vulnerable to their negative emotions. These vulnerabilities will be juxtaposed with experiences of positive emotions or situations, and patients will be challenged to use positive emotions to combat negative ones. Special emphasis will be placed on coping with negative emotions in conflict situations, and patients will process healthy conflict resolution skills.  Therapeutic Goals: 1. Patient will identify two positive emotions or experiences to reflect on in order to balance out negative emotions 2. Patient will label two or more emotions that they find the most difficult to experience 3. Patient will demonstrate positive conflict resolution skills through discussion and/or role plays  Summary of Patient Progress: x   Therapeutic Modalities:   Cognitive Behavioral Therapy Feelings Identification Dialectical Behavioral Therapy   Iris Pert, MSW, LCSW Clinical Social Work 02/04/2020 11:50 AM

## 2020-02-05 MED ORDER — DIPHENHYDRAMINE HCL 25 MG PO CAPS
50.0000 mg | ORAL_CAPSULE | ORAL | Status: DC | PRN
  Administered 2020-02-05: 50 mg via ORAL
  Filled 2020-02-05: qty 2

## 2020-02-05 MED ORDER — ONDANSETRON 4 MG PO TBDP
4.0000 mg | ORAL_TABLET | Freq: Once | ORAL | Status: AC
  Administered 2020-02-05: 4 mg via ORAL
  Filled 2020-02-05: qty 1

## 2020-02-05 NOTE — Plan of Care (Signed)
Pt denies depression, anxiety, SI, HI and AVH. Pt was educated on care plan and verbalizes understanding Location manager Problem: Education: Goal: Ability to state activities that reduce stress will improve Outcome: Progressing   Problem: Education: Goal: Knowledge of [facility] Education information/materials will improve Outcome: Progressing Goal: Emotional status will improve Outcome: Progressing Goal: Mental status will improve Outcome: Progressing Goal: Verbalization of understanding the information provided will improve Outcome: Progressing   Problem: Activity: Goal: Interest or engagement in activities will improve Outcome: Progressing Goal: Sleeping patterns will improve Outcome: Progressing   Problem: Coping: Goal: Ability to verbalize frustrations and anger appropriately will improve Outcome: Progressing Goal: Ability to demonstrate self-control will improve Outcome: Progressing   Problem: Health Behavior/Discharge Planning: Goal: Compliance with treatment plan for underlying cause of condition will improve Outcome: Progressing   Problem: Physical Regulation: Goal: Ability to maintain clinical measurements within normal limits will improve Outcome: Progressing   Problem: Safety: Goal: Periods of time without injury will increase Outcome: Progressing   Problem: Education: Goal: Utilization of techniques to improve thought processes will improve Outcome: Progressing Goal: Knowledge of the prescribed therapeutic regimen will improve Outcome: Progressing   Problem: Coping: Goal: Coping ability will improve Outcome: Progressing   Problem: Coping: Goal: Coping ability will improve Outcome: Progressing   Problem: Health Behavior/Discharge Planning: Goal: Identification of resources available to assist in meeting health care needs will improve Outcome: Progressing

## 2020-02-05 NOTE — Progress Notes (Signed)
[facility] MD Progress Note  02/05/2020 9:37 AM [name]  MRN:  [id number]   Subjective: Follow-up for this 34 year old female diagnosed with MDD.  Patient reports that she is feeling better today.  She reports that she still has some depression and anxiety and reports that her depression is at a 4 out of a 10 and her anxiety is about the same on a scale of 0-10 with 10 being the worst.  Patient denies having any suicidal homicidal ideations and denies any hallucinations.  She also reports that she does get overwhelmed with taking care of her children and she has 2 that continues to live with her and she is currently pregnant.  However, she states that she is feeling better after being here.  She states that she has not been taking the Prozac because she feels that it causes her to have a lucid dreams and she does not like those.  She reports that she will stay compliant with her prenatal vitamins and her vitamin B6.  Principal Problem: MDD (major depressive disorder) Diagnosis: Principal Problem:   MDD (major depressive disorder) Active Problems:   Stimulant use disorder (HCC) (cocaine)   Pregnant  Total Time spent with patient: 20 minutes  Past Psychiatric History: Patient has a history of prior psychiatric admission and treatment for similar situations.  Has been treated with Zoloft and Seroquel in the past.  She tells me that she thinks Zoloft has not actually been helpful for her.  She says even at higher doses she never found it to be useful.  We reviewed her old chart and saw that she had also been prescribed Celexa in the past.  Patient has a distant history of an actual suicide attempt but more recently has only had some cutting as an adult.  Past Medical History:  Past Medical History:  Diagnosis Date  . Anxiety   . Depression   . History of substance abuse (HCC)   . History of suicide attempt   . History of thyroid disease   . Hypertension   . Thyroid disease     Past  Surgical History:  Procedure Laterality Date  . CESAREAN SECTION  2000, 2002, 2013   Family History:  Family History  Problem Relation Age of Onset  . Diabetes Mother    Family Psychiatric  History: None reported Social History:  Social History   Substance and Sexual Activity  Alcohol Use Not Currently   Comment: social drinker     Social History   Substance and Sexual Activity  Drug Use Not Currently  . Types: Cocaine, Marijuana   Comment: Positive UDS 12/2017    Social History   Socioeconomic History  . Marital status: Single    Spouse name: Not on file  . Number of children: Not on file  . Years of education: Not on file  . Highest education level: Not on file  Occupational History  . Not on file  Tobacco Use  . Smoking status: Current Every Day Smoker    Packs/day: 0.50  . Smokeless tobacco: Never Used  Substance and Sexual Activity  . Alcohol use: Not Currently    Comment: social drinker  . Drug use: Not Currently    Types: Cocaine, Marijuana    Comment: Positive UDS 12/2017  . Sexual activity: Not Currently    Birth control/protection: None  Other Topics Concern  . Not on file  Social History Narrative  . Not on file   Social Determinants of Health  Financial Resource Strain:   . Difficulty of Paying Living Expenses: Not on file  Food Insecurity:   . Worried About Programme researcher, broadcasting/film/video in the Last Year: Not on file  . Ran Out of Food in the Last Year: Not on file  Transportation Needs:   . Lack of Transportation (Medical): Not on file  . Lack of Transportation (Non-Medical): Not on file  Physical Activity:   . Days of Exercise per Week: Not on file  . Minutes of Exercise per Session: Not on file  Stress:   . Feeling of Stress : Not on file  Social Connections:   . Frequency of Communication with Friends and Family: Not on file  . Frequency of Social Gatherings with Friends and Family: Not on file  . Attends Religious Services: Not on file  .  Active Member of Clubs or Organizations: Not on file  . Attends Banker Meetings: Not on file  . Marital Status: Not on file   Additional Social History:                         Sleep: Good  Appetite:  Good  Current Medications: Current Facility-Administered Medications  Medication Dose Route Frequency Provider Last Rate Last Admin  . acetaminophen (TYLENOL) tablet 1,000 mg  1,000 mg Oral Q6H PRN Clapacs, Jackquline Denmark, MD   1,000 mg at 02/05/20 0754  . diclofenac Sodium (VOLTAREN) 1 % topical gel 2 g  2 g Topical BID Clapacs, Jackquline Denmark, MD   2 g at 02/05/20 0756  . FLUoxetine (PROZAC) capsule 20 mg  20 mg Oral Daily Clapacs, Jackquline Denmark, MD   20 mg at 02/03/20 0810  . prenatal multivitamin tablet 1 tablet  1 tablet Oral Q1200 Jearld Lesch, NP   1 tablet at 02/05/20 0755  . pyridOXINE (VITAMIN B-6) tablet 25 mg  25 mg Oral Daily Maryagnes Amos, FNP   25 mg at 02/05/20 4401    Lab Results: No results found for this or any previous visit (from the past 48 hour(s)).  Blood Alcohol level:  Lab Results  Component Value Date   ETH <10 02/01/2020   ETH 129 (H) 01/07/2018    Metabolic Disorder Labs: Lab Results  Component Value Date   HGBA1C 5.1 01/02/2018   MPG 99.67 01/02/2018   No results found for: PROLACTIN Lab Results  Component Value Date   CHOL 218 (H) 01/02/2018   TRIG 187 (H) 01/02/2018   HDL 81 01/02/2018   CHOLHDL 2.7 01/02/2018   VLDL 37 01/02/2018   LDLCALC 100 (H) 01/02/2018   LDLCALC 82 01/12/2016    Physical Findings: AIMS:  , ,  ,  ,    CIWA:    COWS:     Musculoskeletal: Strength & Muscle Tone: within normal limits Gait & Station: normal Patient leans: N/A  Psychiatric Specialty Exam: Physical Exam  Nursing note and vitals reviewed. Constitutional: She is oriented to person, place, and time. She appears well-developed and well-nourished.  Cardiovascular: Normal rate.  Respiratory: Effort normal.  Musculoskeletal:         General: Normal range of motion.  Neurological: She is alert and oriented to person, place, and time.  Skin: Skin is warm.    Review of Systems  Constitutional: Negative.   HENT: Negative.   Eyes: Negative.   Respiratory: Negative.   Cardiovascular: Negative.   Gastrointestinal: Negative.   Genitourinary: Negative.   Musculoskeletal: Negative.  Skin: Negative.   Neurological: Negative.   Psychiatric/Behavioral: Positive for dysphoric mood. The patient is nervous/anxious.     Blood pressure 136/72, pulse 70, temperature 99.1 F (37.3 C), temperature source Oral, resp. rate 16, height 4\' 11"  (1.499 m), weight 69.4 kg, last menstrual period 12/04/2019, SpO2 100 %.Body mass index is 30.9 kg/m.  General Appearance: Casual  Eye Contact:  Good  Speech:  Clear and Coherent and Normal Rate  Volume:  Normal  Mood:  Anxious and Depressed  Affect:  Congruent  Thought Process:  Coherent and Descriptions of Associations: Intact  Orientation:  Full (Time, Place, and Person)  Thought Content:  WDL  Suicidal Thoughts:  No  Homicidal Thoughts:  No  Memory:  Immediate;   Fair Recent;   Fair Remote;   Fair  Judgement:  Fair  Insight:  Fair  Psychomotor Activity:  Normal  Concentration:  Concentration: Fair  Recall:  [employer] of Knowledge:  Fair  Language:  Fair  Akathisia:  No  Handed:  Right  AIMS (if indicated):     Assets:  Communication Skills Desire for Improvement Financial Resources/Insurance Housing Resilience Social Support  ADL's:  Intact  Cognition:  WNL  Sleep:  Number of Hours: 7.25   Assessment: Patient presents in the day room and is interacting with peers and staff appropriately.  Patient is pleasant, calm, cooperative during the assessment.  Patient has denied any suicidal homicidal ideations and denies any hallucinations.  Patient does appear to have be having a significant improvement with major concerns being with her children.  Patient does show improvement  with her presentation as far as her affect and mood.  After discussing with Dr. Weber Cooks the plan is for the patient to discharge tomorrow.  Treatment Plan Summary: Daily contact with patient to assess and evaluate symptoms and progress in treatment and Medication management  Continue prenatal vitamin 1 tablet p.o. daily due to pregnancy Continue vitamin B6 25 mg p.o. daily We will continue the Prozac 20 mg p.o. daily at this time the patient has been refusing to take it as indicating that she is having lucid dreams with it Encourage group therapy participation Continue every 15 minute safety checks Plan discharge for tomorrow  Lewis Shock, FNP 02/05/2020, 9:37 AM

## 2020-02-05 NOTE — [facility] Group Notes (Signed)
LCSW Group Therapy Note  02/05/2020 1:00 PM  Type of Therapy/Topic:  Group Therapy:  Balance in Life  Participation Level:  Active  Description of Group:    This group will address the concept of balance and how it feels and looks when one is unbalanced. Patients will be encouraged to process areas in their lives that are out of balance and identify reasons for remaining unbalanced. Facilitators will guide patients in utilizing problem-solving interventions to address and correct the stressor making their life unbalanced. Understanding and applying boundaries will be explored and addressed for obtaining and maintaining a balanced life. Patients will be encouraged to explore ways to assertively make their unbalanced needs known to significant others in their lives, using other group members and facilitator for support and feedback.  Therapeutic Goals: 1. Patient will identify two or more emotions or situations they have that consume much of in their lives. 2. Patient will identify signs/triggers that life has become out of balance:  3. Patient will identify two ways to set boundaries in order to achieve balance in their lives:  4. Patient will demonstrate ability to communicate their needs through discussion and/or role plays  Summary of Patient Progress: Patient was present in group.  Patient was an active participant.  Patient engaged in group discussions.  Patient shared her goals for hte future is to get married and develop stability in her life.  Patient also shared that she values honesty and integrity in others.    Therapeutic Modalities:   Cognitive Behavioral Therapy Solution-Focused Therapy Assertiveness Training  Penni Homans MSW, LCSW 02/05/2020 11:41 AM

## 2020-02-05 NOTE — Progress Notes (Signed)
Recreation Therapy Notes   Date: 02/05/2020  Time: 9:30 am   Location: Room 21   Behavioral response: N/A   Intervention Topic: Animal Assisted Therapy   Discussion/Intervention: Patient did not attend group.   Clinical Observations/Feedback:  Patient did not attend group.   [name] LRT/CTRS        [name] 02/05/2020 12:07 PM

## 2020-02-06 MED ORDER — PYRIDOXINE HCL 25 MG PO TABS: 25.0000 mg | ORAL_TABLET | Freq: Every day | ORAL | 1 refills | Status: DC

## 2020-02-06 MED ORDER — FLUOXETINE HCL 20 MG PO CAPS: 20.0000 mg | ORAL_CAPSULE | Freq: Every day | ORAL | 1 refills | Status: DC

## 2020-02-06 MED ORDER — PRENATAL MULTIVITAMIN CH: 1.0000 | ORAL_TABLET | Freq: Every day | ORAL | 1 refills | Status: DC

## 2020-02-06 NOTE — Progress Notes (Signed)
  [facility] Adult Case Management Discharge Plan :  Will you be returning to the same living situation after discharge:  Yes,  home At discharge, do you have transportation home?: Yes,  safe transport Do you have the ability to pay for your medications: Yes,  medicaid  Release of information consent forms completed and in the chart;    Patient to Follow up at: Follow-up Information    [facility] Follow up on 02/13/2020.   Why: You have a zoom meeting 02/13/20 at 7am with Unk Pinto for peer support services. Thank You! Contact information: [address] [place] [address] [phone]           Next level of care provider has access to [facility] Link:no  Safety Planning and Suicide Prevention discussed: Yes,  SPE completed with pt as pt declined collateral contact  Have you used any form of tobacco in the last 30 days? (Cigarettes, Smokeless Tobacco, Cigars, and/or Pipes): Yes  Has patient been referred to the Quitline?: Patient refused referral  Patient has been referred for addiction treatment: Pt. refused referral  Mechele Dawley, LCSW 02/06/2020, 9:50 AM

## 2020-02-06 NOTE — Plan of Care (Signed)
Patient pleasant denying SI/HI/AVH with this Clinical research associate  Problem: Education: Goal: Emotional status will improve Outcome: Progressing Goal: Mental status will improve Outcome: Progressing

## 2020-02-06 NOTE — [facility] Group Notes (Signed)
Emotional Regulation 02/06/2020 1PM  Type of Therapy/Topic:  Group Therapy:  Emotion Regulation  Participation Level:  Active   Description of Group:   The purpose of this group is to assist patients in learning to regulate negative emotions and experience positive emotions. Patients will be guided to discuss ways in which they have been vulnerable to their negative emotions. These vulnerabilities will be juxtaposed with experiences of positive emotions or situations, and patients will be challenged to use positive emotions to combat negative ones. Special emphasis will be placed on coping with negative emotions in conflict situations, and patients will process healthy conflict resolution skills.  Therapeutic Goals: 1. Patient will identify two positive emotions or experiences to reflect on in order to balance out negative emotions 2. Patient will label two or more emotions that they find the most difficult to experience 3. Patient will demonstrate positive conflict resolution skills through discussion and/or role plays  Summary of Patient Progress:  Actively participated during session. Pt came to group late. Pt interacted appropriately with group members and engaged in group activity. Pt briefly discussed with group members her challenges with self esteem. Pt praised by group members for sharing her experience and receptive to feedback. Pt respected boundaries during session.   Therapeutic Modalities:   Cognitive Behavioral Therapy Feelings Identification Dialectical Behavioral Therapy   Suzan Slick, LCSW 02/06/2020 2:05 PM

## 2020-02-06 NOTE — Progress Notes (Signed)
Patient was in the day room upon arrival to the unit. Patient more compliant on the unit this evening. Patient denies SI/HI/AVH and anxiety with depression. Patient didn't have any medications scheduled this evening and didn't request anything PRN. Patient given education. 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 Patient being monitored Q 15 minutes for safety per unit protocol. Patient remains safe on the unit. 

## 2020-02-06 NOTE — Discharge Summary (Signed)
Physician Discharge Summary Note  Patient:  [name] is an 34 y.o., female MRN:  [id number] DOB:  07/20/1986 Patient phone:  [phone] (home)  Patient address:   [address] [place] [address],  Total Time spent with patient: 30 minutes  Date of Admission:  02/02/2020 Date of Discharge: 02/06/20  Reason for Admission:  34 year old woman with a history of recurrent depression and substance abuse came to the hospital requesting help for her depressed mood.  She tells me she has been depressed for months and things have been getting worse recently.  Principal Problem: MDD (major depressive disorder) Discharge Diagnoses: Principal Problem:   MDD (major depressive disorder) Active Problems:   Stimulant use disorder (HCC) (cocaine)   Pregnant   Past Psychiatric History: Patient has a history of prior psychiatric admission and treatment for similar situations.  Has been treated with Zoloft and Seroquel in the past.  She tells me that she thinks Zoloft has not actually been helpful for her.  She says even at higher doses she never found it to be useful.  We reviewed her old chart and saw that she had also been prescribed Celexa in the past.  Patient has a distant history of an actual suicide attempt but more recently has only had some cutting as an adult.  Past Medical History:  Past Medical History:  Diagnosis Date  . Anxiety   . Depression   . History of substance abuse (HCC)   . History of suicide attempt   . History of thyroid disease   . Hypertension   . Thyroid disease     Past Surgical History:  Procedure Laterality Date  . CESAREAN SECTION  2000, 2002, 2013   Family History:  Family History  Problem Relation Age of Onset  . Diabetes Mother    Family Psychiatric  History: None reported Social History:  Social History   Substance and Sexual Activity  Alcohol Use Not Currently   Comment: social drinker     Social History   Substance and Sexual  Activity  Drug Use Not Currently  . Types: Cocaine, Marijuana   Comment: Positive UDS 12/2017    Social History   Socioeconomic History  . Marital status: Single    Spouse name: Not on file  . Number of children: Not on file  . Years of education: Not on file  . Highest education level: Not on file  Occupational History  . Not on file  Tobacco Use  . Smoking status: Current Every Day Smoker    Packs/day: 0.50  . Smokeless tobacco: Never Used  Substance and Sexual Activity  . Alcohol use: Not Currently    Comment: social drinker  . Drug use: Not Currently    Types: Cocaine, Marijuana    Comment: Positive UDS 12/2017  . Sexual activity: Not Currently    Birth control/protection: None  Other Topics Concern  . Not on file  Social History Narrative  . Not on file   Social Determinants of Health   Financial Resource Strain:   . Difficulty of Paying Living Expenses: Not on file  Food Insecurity:   . Worried About Programme researcher, broadcasting/film/video in the Last Year: Not on file  . Ran Out of Food in the Last Year: Not on file  Transportation Needs:   . Lack of Transportation (Medical): Not on file  . Lack of Transportation (Non-Medical): Not on file  Physical Activity:   . Days of Exercise per Week:  Not on file  . Minutes of Exercise per Session: Not on file  Stress:   . Feeling of Stress : Not on file  Social Connections:   . Frequency of Communication with Friends and Family: Not on file  . Frequency of Social Gatherings with Friends and Family: Not on file  . Attends Religious Services: Not on file  . Active Member of Clubs or Organizations: Not on file  . Attends Archivist Meetings: Not on file  . Marital Status: Not on file    Hospital Course:  Patient remained on the [facility] unit for 4 days. The patient stabilized on medication and therapy. Patient was discharged on Prozac 20 mg Daily, prenatal vitamin 1 tablet daily, vitamin B6 25 mg daily. Patient has shown improvement  with improved mood, affect, sleep, appetite, and interaction. Patient has attended group and participated. Patient has been seen in the day room interacting with peers and staff appropriately. Patient denies any SI/HI/AVH and contracts for safety. Patient agrees to follow up at [facility]. Patient is provided with prescriptions for their medications upon discharge.  Physical Findings: AIMS:  , ,  ,  ,    CIWA:    COWS:     Musculoskeletal: Strength & Muscle Tone: within normal limits Gait & Station: normal Patient leans: N/A  Psychiatric Specialty Exam: Physical Exam  Nursing note and vitals reviewed. Constitutional: She is oriented to person, place, and time. She appears well-developed and well-nourished.  Cardiovascular: Normal rate.  Respiratory: Effort normal.  Musculoskeletal:        General: Normal range of motion.  Neurological: She is alert and oriented to person, place, and time.  Skin: Skin is warm.    Review of Systems  Constitutional: Negative.   HENT: Negative.   Eyes: Negative.   Respiratory: Negative.   Cardiovascular: Negative.   Genitourinary: Negative.   Musculoskeletal: Negative.   Skin: Negative.   Neurological: Negative.   Psychiatric/Behavioral: Negative.     Blood pressure 136/72, pulse 70, temperature 99.1 F (37.3 C), temperature source Oral, resp. rate 16, height 4\' 11"  (1.499 m), weight 69.4 kg, last menstrual period 12/04/2019, SpO2 100 %.Body mass index is 30.9 kg/m.   General Appearance: Casual  Eye Contact::  Good  Speech:  Clear and [id number]  Volume:  Normal  Mood:  Anxious  Affect:  Constricted  Thought Process:  Goal Directed  Orientation:  Full (Time, Place, and Person)  Thought Content:  Logical  Suicidal Thoughts:  No  Homicidal Thoughts:  No  Memory:  Immediate;   Fair Recent;   Fair Remote;   Fair  Judgement:  Fair  Insight:  Fair  Psychomotor Activity:  Normal  Concentration:  Fair  Recall:  [employer]  of [place]  Language: Fair  Akathisia:  No  Handed:  Right  AIMS (if indicated):     Assets:  Desire for Improvement  Sleep:  Number of Hours: 7.5  Cognition: WNL  ADL's:  Intact   Have you used any form of tobacco in the last 30 days? (Cigarettes, Smokeless Tobacco, Cigars, and/or Pipes): Yes  Has this patient used any form of tobacco in the last 30 days? (Cigarettes, Smokeless Tobacco, Cigars, and/or Pipes) Yes, Yes, A prescription for an FDA-approved tobacco cessation medication was offered at discharge and the patient refused  Blood Alcohol level:  Lab Results  Component Value Date   ETH <10 02/01/2020   ETH 129 (H) 88/41/6606    Metabolic Disorder  Labs:  Lab Results  Component Value Date   HGBA1C 5.1 01/02/2018   MPG 99.67 01/02/2018   No results found for: PROLACTIN Lab Results  Component Value Date   CHOL 218 (H) 01/02/2018   TRIG 187 (H) 01/02/2018   HDL 81 01/02/2018   CHOLHDL 2.7 01/02/2018   VLDL 37 01/02/2018   LDLCALC 100 (H) 01/02/2018   LDLCALC 82 01/12/2016    See Psychiatric Specialty Exam and Suicide Risk Assessment completed by Attending Physician prior to discharge.  Discharge destination:  Home  Is patient on multiple antipsychotic therapies at discharge:  No   Has Patient had three or more failed trials of antipsychotic monotherapy by history:  No  Recommended Plan for Multiple Antipsychotic Therapies: NA  Discharge Instructions    Diet - low sodium heart healthy   Complete by: As directed    Increase activity slowly   Complete by: As directed      Allergies as of 02/06/2020      Reactions   Aspirin Anaphylaxis, Diarrhea, Nausea And Vomiting   Banana Anaphylaxis, Itching, Shortness Of Breath, Swelling   Other Itching, Shortness Of Breath, Swelling   Peanut Oil Itching, Shortness Of Breath, Swelling   Peanut-containing Drug Products Shortness Of Breath, Itching, Swelling   Pecan Nut (diagnostic) Anaphylaxis      Medication  List    STOP taking these medications   albuterol 108 (90 Base) MCG/ACT inhaler Commonly known as: VENTOLIN HFA   dicyclomine 10 MG capsule Commonly known as: Bentyl   doxycycline 100 MG capsule Commonly known as: VIBRAMYCIN   fexofenadine-pseudoephedrine 60-120 MG 12 hr tablet Commonly known as: ALLEGRA-D   folic acid 1 MG tablet Commonly known as: FOLVITE   hydrochlorothiazide 25 MG tablet Commonly known as: HYDRODIURIL   hydroxyprogesterone caproate 250 mg/mL Oil injection Commonly known as: MAKENA   ondansetron 8 MG disintegrating tablet Commonly known as: Zofran ODT   pantoprazole 20 MG tablet Commonly known as: Protonix   QUEtiapine 50 MG tablet Commonly known as: SEROQUEL   senna 8.6 MG Tabs tablet Commonly known as: SENOKOT   sertraline 50 MG tablet Commonly known as: ZOLOFT     TAKE these medications     Indication  FLUoxetine 20 MG capsule Commonly known as: PROZAC Take 1 capsule (20 mg total) by mouth daily.  Indication: Major Depressive Disorder   prenatal multivitamin Tabs tablet Take 1 tablet by mouth daily at 12 noon.  Indication: Pregnancy   pyridOXINE 25 MG tablet Commonly known as: VITAMIN B-6 Take 1 tablet (25 mg total) by mouth daily.  Indication: Nausea and Vomiting in Pregnancy      Follow-up Information    [facility] Follow up.   Why: The walk in clinic hours are Monday-Friday 9am-4pm. Please bring photo ID, insurance card, hospital discharge paperwork. Thank you. Contact information: [address] [place] [address] [phone]           Follow-up recommendations:  Continue activity as tolerated. Continue diet as recommended by your PCP. Ensure to keep all appointments with outpatient providers.  Comments:  Patient is instructed prior to discharge to: Take all medications as prescribed by his/her mental healthcare provider. Report any adverse effects and or reactions from the medicines to  his/her outpatient provider promptly. Patient has been instructed & cautioned: To not engage in alcohol and or illegal drug use while on prescription medicines. In the event of worsening symptoms, patient is instructed to call the crisis hotline, 911 and  or go to the nearest ED for appropriate evaluation and treatment of symptoms. To follow-up with his/her primary care provider for your other medical issues, concerns and or health care needs.    Signed: Gerlene Burdock Yazmyn Valbuena, FNP 02/06/2020, 9:01 AM

## 2020-02-06 NOTE — Progress Notes (Signed)
   02/06/20 1100  Clinical Encounter Type  Visited With Patient  Visit Type Follow-up  Referral From Chaplain  Consult/Referral To Chaplain  Chaplain stopped in patient's room. Patient was laying down and her room was dark. Chaplain asked patient about discharge and patient said she wasn't sure because of how she was feeling. Chaplain offered pastoral presence and empathy.

## 2020-02-06 NOTE — Progress Notes (Signed)
Patient ID: [name], female   DOB: 09-15-1986, 34 y.o.   MRN: [id number]  Discharge Note:  Patient denies SI/HI/AVH at this time. Discharge instructions, AVS, prescriptions, and transition record gone over with patient. Patient agrees to comply with medication management, follow-up visit, and outpatient therapy. Patient belongings returned to patient. Patient questions and concerns addressed and answered. Patient ambulatory off unit. Patient discharged to home via [employer].

## 2020-02-06 NOTE — [facility] Suicide Risk Assessment (Signed)
[facility] Discharge Suicide Risk Assessment   Principal Problem: MDD (major depressive disorder) Discharge Diagnoses: Principal Problem:   MDD (major depressive disorder) Active Problems:   Stimulant use disorder (HCC) (cocaine)   Pregnant   Total Time spent with patient: 30 minutes  Musculoskeletal: Strength & Muscle Tone: within normal limits Gait & Station: normal Patient leans: N/A  Psychiatric Specialty Exam: Review of Systems  Constitutional: Negative.   HENT: Negative.   Eyes: Negative.   Respiratory: Negative.   Cardiovascular: Negative.   Gastrointestinal: Positive for abdominal pain, nausea and vomiting.  Musculoskeletal: Negative.   Skin: Negative.   Neurological: Negative.   Psychiatric/Behavioral: Negative.     Blood pressure 136/72, pulse 70, temperature 99.1 F (37.3 C), temperature source Oral, resp. rate 16, height 4\' 11"  (1.499 m), weight 69.4 kg, last menstrual period 12/04/2019, SpO2 100 %.Body mass index is 30.9 kg/m.  General Appearance: Casual  Eye Contact::  Good  Speech:  Clear and Coherent409  Volume:  Normal  Mood:  Anxious  Affect:  Constricted  Thought Process:  Goal Directed  Orientation:  Full (Time, Place, and Person)  Thought Content:  Logical  Suicidal Thoughts:  No  Homicidal Thoughts:  No  Memory:  Immediate;   Fair Recent;   Fair Remote;   Fair  Judgement:  Fair  Insight:  Fair  Psychomotor Activity:  Normal  Concentration:  Fair  Recall:  [phone] of Knowledge:Fair  Language: Fair  Akathisia:  No  Handed:  Right  AIMS (if indicated):     Assets:  Desire for Improvement  Sleep:  Number of Hours: 7.5  Cognition: WNL  ADL's:  Intact   Mental Status Per Nursing Assessment::   On Admission:  NA  Demographic Factors:  Low socioeconomic status  Loss Factors: Financial problems/change in socioeconomic status  Historical Factors: Impulsivity  Risk Reduction Factors:   Responsible for children under 83 years of age,  Living with another person, especially a relative, Positive social support and Positive therapeutic relationship  Continued Clinical Symptoms:  Depression:   Impulsivity Alcohol/Substance Abuse/Dependencies  Cognitive Features That Contribute To Risk:  None    Suicide Risk:  Minimal: No identifiable suicidal ideation.  Patients presenting with no risk factors but with morbid ruminations; may be classified as minimal risk based on the severity of the depressive symptoms  Follow-up Information    [facility] Follow up.   Why: The walk in clinic hours are Monday-Friday 9am-4pm. Please bring photo ID, insurance card, hospital discharge paperwork. Thank you. Contact information: [phone] [address]           Plan Of Care/Follow-up recommendations:  Activity:  Activity as tolerated Diet:  Regular diet Other:  Follow-up with outpatient treatment through either [facility] or [facility] at the patient's choice.  [phone], MD 02/06/2020, 9:21 AM

## 2020-02-06 NOTE — Progress Notes (Signed)
Recreation Therapy Notes          Lidiya Reise 02/06/2020 11:52 AM

## 2020-04-07 ENCOUNTER — Encounter: Payer: Self-pay | Admitting: Emergency Medicine

## 2020-04-07 ENCOUNTER — Other Ambulatory Visit: Payer: Self-pay

## 2020-04-07 ENCOUNTER — Emergency Department
Admission: EM | Admit: 2020-04-07 | Discharge: 2020-04-08 | Disposition: A | Discharge status: Other Acute Inpatient Hospital | Payer: Medicaid Other | Attending: Emergency Medicine | Admitting: Emergency Medicine

## 2020-04-07 DIAGNOSIS — F332 Major depressive disorder, recurrent severe without psychotic features: Secondary | ICD-10-CM | POA: Diagnosis not present

## 2020-04-07 DIAGNOSIS — R45851 Suicidal ideations: Secondary | ICD-10-CM | POA: Diagnosis present

## 2020-04-07 DIAGNOSIS — O99891 Other specified diseases and conditions complicating pregnancy: Secondary | ICD-10-CM | POA: Insufficient documentation

## 2020-04-07 DIAGNOSIS — O99312 Alcohol use complicating pregnancy, second trimester: Secondary | ICD-10-CM | POA: Insufficient documentation

## 2020-04-07 DIAGNOSIS — O99322 Drug use complicating pregnancy, second trimester: Secondary | ICD-10-CM | POA: Diagnosis not present

## 2020-04-07 DIAGNOSIS — F1220 Cannabis dependence, uncomplicated: Secondary | ICD-10-CM | POA: Diagnosis present

## 2020-04-07 DIAGNOSIS — Z91010 Allergy to peanuts: Secondary | ICD-10-CM | POA: Diagnosis not present

## 2020-04-07 DIAGNOSIS — Z3A18 18 weeks gestation of pregnancy: Secondary | ICD-10-CM | POA: Diagnosis not present

## 2020-04-07 DIAGNOSIS — F17200 Nicotine dependence, unspecified, uncomplicated: Secondary | ICD-10-CM | POA: Diagnosis not present

## 2020-04-07 DIAGNOSIS — O99332 Smoking (tobacco) complicating pregnancy, second trimester: Secondary | ICD-10-CM | POA: Diagnosis not present

## 2020-04-07 DIAGNOSIS — R109 Unspecified abdominal pain: Secondary | ICD-10-CM | POA: Diagnosis present

## 2020-04-07 DIAGNOSIS — Z20822 Contact with and (suspected) exposure to covid-19: Secondary | ICD-10-CM | POA: Diagnosis not present

## 2020-04-07 DIAGNOSIS — F1420 Cocaine dependence, uncomplicated: Secondary | ICD-10-CM | POA: Diagnosis present

## 2020-04-07 DIAGNOSIS — Z3490 Encounter for supervision of normal pregnancy, unspecified, unspecified trimester: Secondary | ICD-10-CM

## 2020-04-07 DIAGNOSIS — F329 Major depressive disorder, single episode, unspecified: Secondary | ICD-10-CM | POA: Diagnosis present

## 2020-04-07 DIAGNOSIS — F1210 Cannabis abuse, uncomplicated: Secondary | ICD-10-CM | POA: Insufficient documentation

## 2020-04-07 DIAGNOSIS — F1020 Alcohol dependence, uncomplicated: Secondary | ICD-10-CM | POA: Diagnosis present

## 2020-04-07 DIAGNOSIS — O162 Unspecified maternal hypertension, second trimester: Secondary | ICD-10-CM | POA: Insufficient documentation

## 2020-04-07 DIAGNOSIS — F1410 Cocaine abuse, uncomplicated: Secondary | ICD-10-CM | POA: Insufficient documentation

## 2020-04-07 DIAGNOSIS — X789XXA Intentional self-harm by unspecified sharp object, initial encounter: Secondary | ICD-10-CM | POA: Diagnosis present

## 2020-04-07 DIAGNOSIS — Z3492 Encounter for supervision of normal pregnancy, unspecified, second trimester: Secondary | ICD-10-CM

## 2020-04-07 DIAGNOSIS — F1290 Cannabis use, unspecified, uncomplicated: Secondary | ICD-10-CM

## 2020-04-07 DIAGNOSIS — F1099 Alcohol use, unspecified with unspecified alcohol-induced disorder: Secondary | ICD-10-CM | POA: Diagnosis not present

## 2020-04-07 DIAGNOSIS — Z91199 Patient's noncompliance with other medical treatment and regimen due to unspecified reason: Secondary | ICD-10-CM

## 2020-04-07 DIAGNOSIS — F339 Major depressive disorder, recurrent, unspecified: Secondary | ICD-10-CM

## 2020-04-07 DIAGNOSIS — Z9119 Patient's noncompliance with other medical treatment and regimen: Secondary | ICD-10-CM

## 2020-04-07 DIAGNOSIS — S61519A Laceration without foreign body of unspecified wrist, initial encounter: Secondary | ICD-10-CM | POA: Diagnosis present

## 2020-04-07 DIAGNOSIS — F1590 Other stimulant use, unspecified, uncomplicated: Secondary | ICD-10-CM | POA: Diagnosis present

## 2020-04-07 DIAGNOSIS — R4589 Other symptoms and signs involving emotional state: Secondary | ICD-10-CM | POA: Diagnosis present

## 2020-04-07 NOTE — ED Triage Notes (Signed)
Patient ambulatory to triage with steady gait, without difficulty or distress noted, mask in place; pt reports feeling suicidal tonight "just don't have anything to do it with right now"; st approx 46mo pregnant but unsure of EDC

## 2020-04-07 NOTE — ED Notes (Signed)
Pt removes black print dress, sandals--placed in labeled pt belonging bag to be secured on nursing unit and pt changed into burgandy scrubs

## 2020-04-08 ENCOUNTER — Encounter: Payer: Self-pay | Admitting: Behavioral Health

## 2020-04-08 ENCOUNTER — Inpatient Hospital Stay
Admission: AD | Admit: 2020-04-08 | Discharge: 2020-04-12 | DRG: 832 | Disposition: A | Discharge status: Home / Self Care | Payer: Medicaid Other | Source: Intra-hospital | Attending: Psychiatry | Admitting: Psychiatry

## 2020-04-08 ENCOUNTER — Emergency Department: Payer: Medicaid Other

## 2020-04-08 DIAGNOSIS — R45851 Suicidal ideations: Secondary | ICD-10-CM | POA: Diagnosis present

## 2020-04-08 DIAGNOSIS — F1020 Alcohol dependence, uncomplicated: Secondary | ICD-10-CM | POA: Diagnosis not present

## 2020-04-08 DIAGNOSIS — Z915 Personal history of self-harm: Secondary | ICD-10-CM | POA: Diagnosis not present

## 2020-04-08 DIAGNOSIS — Z886 Allergy status to analgesic agent status: Secondary | ICD-10-CM | POA: Diagnosis not present

## 2020-04-08 DIAGNOSIS — Z3490 Encounter for supervision of normal pregnancy, unspecified, unspecified trimester: Secondary | ICD-10-CM

## 2020-04-08 DIAGNOSIS — O162 Unspecified maternal hypertension, second trimester: Secondary | ICD-10-CM | POA: Diagnosis present

## 2020-04-08 DIAGNOSIS — Z9119 Patient's noncompliance with other medical treatment and regimen: Secondary | ICD-10-CM | POA: Diagnosis not present

## 2020-04-08 DIAGNOSIS — F332 Major depressive disorder, recurrent severe without psychotic features: Secondary | ICD-10-CM | POA: Diagnosis present

## 2020-04-08 DIAGNOSIS — Z91018 Allergy to other foods: Secondary | ICD-10-CM | POA: Diagnosis not present

## 2020-04-08 DIAGNOSIS — O99312 Alcohol use complicating pregnancy, second trimester: Secondary | ICD-10-CM | POA: Diagnosis present

## 2020-04-08 DIAGNOSIS — Z3A18 18 weeks gestation of pregnancy: Secondary | ICD-10-CM

## 2020-04-08 DIAGNOSIS — O99891 Other specified diseases and conditions complicating pregnancy: Secondary | ICD-10-CM | POA: Diagnosis not present

## 2020-04-08 DIAGNOSIS — Z91199 Patient's noncompliance with other medical treatment and regimen due to unspecified reason: Secondary | ICD-10-CM

## 2020-04-08 DIAGNOSIS — F1590 Other stimulant use, unspecified, uncomplicated: Secondary | ICD-10-CM | POA: Diagnosis present

## 2020-04-08 DIAGNOSIS — E079 Disorder of thyroid, unspecified: Secondary | ICD-10-CM | POA: Diagnosis present

## 2020-04-08 DIAGNOSIS — O99332 Smoking (tobacco) complicating pregnancy, second trimester: Secondary | ICD-10-CM | POA: Diagnosis present

## 2020-04-08 DIAGNOSIS — F1510 Other stimulant abuse, uncomplicated: Secondary | ICD-10-CM | POA: Diagnosis present

## 2020-04-08 DIAGNOSIS — O99282 Endocrine, nutritional and metabolic diseases complicating pregnancy, second trimester: Secondary | ICD-10-CM | POA: Diagnosis present

## 2020-04-08 DIAGNOSIS — O99322 Drug use complicating pregnancy, second trimester: Secondary | ICD-10-CM | POA: Diagnosis present

## 2020-04-08 DIAGNOSIS — Z833 Family history of diabetes mellitus: Secondary | ICD-10-CM | POA: Diagnosis not present

## 2020-04-08 DIAGNOSIS — O99342 Other mental disorders complicating pregnancy, second trimester: Principal | ICD-10-CM | POA: Diagnosis present

## 2020-04-08 DIAGNOSIS — Z91010 Allergy to peanuts: Secondary | ICD-10-CM

## 2020-04-08 LAB — SALICYLATE LEVEL: Salicylate Lvl: <7 mg/dL — ABNORMAL LOW (ref 7.0–30.0)

## 2020-04-08 LAB — ETHANOL: Alcohol, Ethyl (B): 26 mg/dL — ABNORMAL HIGH (ref <10)

## 2020-04-08 LAB — URINALYSIS, COMPLETE (UACMP) WITH MICROSCOPIC
Bacteria, UA: NONE SEEN
Bilirubin Urine: NEGATIVE
Glucose, UA: NEGATIVE mg/dL
Hgb urine dipstick: NEGATIVE
Ketones, ur: NEGATIVE mg/dL
Nitrite: NEGATIVE
Protein, ur: NEGATIVE mg/dL
Specific Gravity, Urine: 1.031 — ABNORMAL HIGH (ref 1.005–1.030)
pH: 5 (ref 5.0–8.0)

## 2020-04-08 LAB — CBC WITH DIFFERENTIAL/PLATELET
Abs Immature Granulocytes: 0.11 10*3/uL — ABNORMAL HIGH (ref 0.00–0.07)
Basophils Absolute: 0.1 10*3/uL (ref 0.0–0.1)
Basophils Relative: 0 %
Eosinophils Absolute: 1.6 10*3/uL — ABNORMAL HIGH (ref 0.0–0.5)
Eosinophils Relative: 12 %
HCT: 32.6 % — ABNORMAL LOW (ref 36.0–46.0)
Hemoglobin: 11 g/dL — ABNORMAL LOW (ref 12.0–15.0)
Immature Granulocytes: 1 %
Lymphocytes Relative: 27 %
Lymphs Abs: 3.5 10*3/uL (ref 0.7–4.0)
MCH: 29.6 pg (ref 26.0–34.0)
MCHC: 33.7 g/dL (ref 30.0–36.0)
MCV: 87.9 fL (ref 80.0–100.0)
Monocytes Absolute: 0.7 10*3/uL (ref 0.1–1.0)
Monocytes Relative: 5 %
Neutro Abs: 7.1 10*3/uL (ref 1.7–7.7)
Neutrophils Relative %: 55 %
Platelets: 242 10*3/uL (ref 150–400)
RBC: 3.71 MIL/uL — ABNORMAL LOW (ref 3.87–5.11)
RDW: 14.3 % (ref 11.5–15.5)
WBC: 12.9 10*3/uL — ABNORMAL HIGH (ref 4.0–10.5)
nRBC: 0 % (ref 0.0–0.2)

## 2020-04-08 LAB — HCG, QUANTITATIVE, PREGNANCY: hCG, Beta Chain, Quant, S: 7623 m[IU]/mL — ABNORMAL HIGH (ref <5)

## 2020-04-08 LAB — COMPREHENSIVE METABOLIC PANEL
ALT: 12 U/L (ref 0–44)
AST: 11 U/L — ABNORMAL LOW (ref 15–41)
Albumin: 3.8 g/dL (ref 3.5–5.0)
Alkaline Phosphatase: 56 U/L (ref 38–126)
Anion gap: 10 (ref 5–15)
BUN: 10 mg/dL (ref 6–20)
CO2: 19 mmol/L — ABNORMAL LOW (ref 22–32)
Calcium: 8.4 mg/dL — ABNORMAL LOW (ref 8.9–10.3)
Chloride: 107 mmol/L (ref 98–111)
Creatinine, Ser: 0.51 mg/dL (ref 0.44–1.00)
GFR calc Af Amer: >60 mL/min (ref >60)
GFR calc non Af Amer: >60 mL/min (ref >60)
Glucose, Bld: 109 mg/dL — ABNORMAL HIGH (ref 70–99)
Potassium: 3.3 mmol/L — ABNORMAL LOW (ref 3.5–5.1)
Sodium: 136 mmol/L (ref 135–145)
Total Bilirubin: 0.6 mg/dL (ref 0.3–1.2)
Total Protein: 7.4 g/dL (ref 6.5–8.1)

## 2020-04-08 LAB — URINE DRUG SCREEN, QUALITATIVE (ARMC ONLY)
Amphetamines, Ur Screen: NOT DETECTED
Barbiturates, Ur Screen: NOT DETECTED
Benzodiazepine, Ur Scrn: NOT DETECTED
Cannabinoid 50 Ng, Ur ~~LOC~~: POSITIVE — AB
Cocaine Metabolite,Ur ~~LOC~~: POSITIVE — AB
MDMA (Ecstasy)Ur Screen: NOT DETECTED
Methadone Scn, Ur: NOT DETECTED
Opiate, Ur Screen: NOT DETECTED
Phencyclidine (PCP) Ur S: NOT DETECTED
Tricyclic, Ur Screen: NOT DETECTED

## 2020-04-08 LAB — ACETAMINOPHEN LEVEL: Acetaminophen (Tylenol), Serum: <10 ug/mL — ABNORMAL LOW (ref 10–30)

## 2020-04-08 LAB — RESPIRATORY PANEL BY RT PCR (FLU A&B, COVID)
Influenza A by PCR: NEGATIVE
Influenza B by PCR: NEGATIVE
SARS Coronavirus 2 by RT PCR: NEGATIVE

## 2020-04-08 MED ORDER — ACETAMINOPHEN 325 MG PO TABS
650.0000 mg | ORAL_TABLET | Freq: Four times a day (QID) | ORAL | Status: DC | PRN
  Administered 2020-04-08 – 2020-04-12 (×10): 650 mg via ORAL
  Filled 2020-04-08 (×9): qty 2

## 2020-04-08 MED ORDER — HALOPERIDOL LACTATE 5 MG/ML IJ SOLN
5.0000 mg | Freq: Four times a day (QID) | INTRAMUSCULAR | Status: DC | PRN
  Administered 2020-04-08: 18:00:00 5 mg via INTRAMUSCULAR
  Filled 2020-04-08: qty 1

## 2020-04-08 MED ORDER — PRENATAL MULTIVITAMIN CH
1.0000 | ORAL_TABLET | Freq: Every day | ORAL | Status: DC
  Administered 2020-04-08 – 2020-04-12 (×5): 1 via ORAL
  Filled 2020-04-08 (×5): qty 1

## 2020-04-08 MED ORDER — ALUM & MAG HYDROXIDE-SIMETH 200-200-20 MG/5ML PO SUSP: 30.0000 mL | ORAL | Status: DC | PRN

## 2020-04-08 MED ORDER — DIPHENHYDRAMINE HCL 50 MG/ML IJ SOLN
50.0000 mg | Freq: Four times a day (QID) | INTRAMUSCULAR | Status: DC | PRN
  Administered 2020-04-08: 50 mg via INTRAMUSCULAR
  Filled 2020-04-08: qty 1

## 2020-04-08 MED ORDER — FLUOXETINE HCL 20 MG PO CAPS
20.0000 mg | ORAL_CAPSULE | Freq: Every day | ORAL | Status: DC
  Filled 2020-04-08 (×3): qty 1

## 2020-04-08 MED ORDER — ENSURE ENLIVE PO LIQD
237.0000 mL | Freq: Two times a day (BID) | ORAL | Status: DC
  Administered 2020-04-10: 10:00:00 237 mL via ORAL

## 2020-04-08 MED ORDER — VITAMIN B-6 50 MG PO TABS
25.0000 mg | ORAL_TABLET | Freq: Every day | ORAL | Status: DC
  Administered 2020-04-08 – 2020-04-12 (×5): 25 mg via ORAL
  Filled 2020-04-08 (×6): qty 0.5

## 2020-04-08 MED ORDER — MAGNESIUM HYDROXIDE 400 MG/5ML PO SUSP: 30.0000 mL | Freq: Every day | ORAL | Status: DC | PRN

## 2020-04-08 NOTE — [facility] Group Notes (Signed)
[facility] Group Notes:  (Nursing/MHT/Case Management/Adjunct)  Date:  04/08/2020  Time:  8:49 PM  Type of Therapy:  Group Therapy  Participation Level:  Did Not Attend  Summary of Progress/Problems:  Mayra Neer 04/08/2020, 8:49 PM

## 2020-04-08 NOTE — ED Notes (Signed)
Pt reports no abnormalities with her pregnancy so far. No bleeding, cramping, or unexpected changes

## 2020-04-08 NOTE — ED Notes (Signed)
Pt is still sleeping at this time ? ?

## 2020-04-08 NOTE — ED Notes (Signed)
Pt provided with meal tray and orange juice

## 2020-04-08 NOTE — Progress Notes (Signed)
Patient agitated, screaming, throwing items from her room, and pounding on walls/window sills.  Medication offered and therapeutic communication attempted.  Comforts offered and the patient was unable to be redirected.  Due to safety concerns and the possibility that the patient could harm herself, [facility] provider ordered IM medications.   The patient continued to yell and threatened staff.  She flipped tables and furniture following the safe administration of 5mg  Haldol and 50mg  Benadryl.  Staff present in numbers and provided a calming environment with a chance for the patient to reflect and vent her feelings.    The patient's room was stripped of most items and the patient's mattress was placed on the floor.  Staff discussed alternative approaches and other means of handling the situation.  The patient calmed.

## 2020-04-08 NOTE — ED Notes (Signed)
Gave pt breakfast tray with juice. 

## 2020-04-08 NOTE — Plan of Care (Signed)
Patient acted out on the unit today, has been asleep since this Clinical research associate got here  Problem: Education: Goal: Mental status will improve Outcome: Not Progressing

## 2020-04-08 NOTE — BH Assessment (Signed)
Assessment Note  [name] is an 34 y.o. female. [name] arrived to the ED by way of law enforcement. She reports, "My depression is not getting no better, I don't know if it has to do with hormones because I am pregnant, but it ain't getting no better."  She shared, "I'm depressed and can't cry, Sometimes all I do is sleep, I force myself to eat, I feel angry and I want to cry.   I am always in the house, and I don't want to be at home, but I have no where to go. II don't find enjoyment in everything.  It doesn't matter if I do anything, it is still going to be the same. She reports an increase in anxiety.  She cannot identify a trigger.  She denied having auditory or visual hallucinations.  She denied homicidal ideation or intent.  She currently wants to harm herself by cutting. She is facing stress due to a Child psychotherapist being involved with her because her house is not clean, her mother moved in with her and she is not helping.  She reports that tonight she smoked "a little weed and drunk something tonight". She denied additional substance use.        Diagnosis: Major Depression  Past Medical History:  Past Medical History:  Diagnosis Date  . Anxiety   . Depression   . History of substance abuse (HCC)   . History of suicide attempt   . History of thyroid disease   . Hypertension   . Thyroid disease     Past Surgical History:  Procedure Laterality Date  . CESAREAN SECTION  2000, 2002, 2013    Family History:  Family History  Problem Relation Age of Onset  . Diabetes Mother     Social History:  reports that she has been smoking. She has been smoking about 0.50 packs per day. She has never used smokeless tobacco. She reports previous alcohol use. She reports previous drug use. Drugs: Cocaine and Marijuana.  Additional Social History:     CIWA: CIWA-Ar BP: 133/62 Pulse Rate: 92 COWS:    Allergies:  Allergies  Allergen Reactions  . Aspirin Anaphylaxis, Diarrhea and  Nausea And Vomiting  . Banana Anaphylaxis, Itching, Shortness Of Breath and Swelling  . Other Itching, Shortness Of Breath and Swelling  . Peanut Oil Itching, Shortness Of Breath and Swelling  . Peanut-Containing Drug Products Shortness Of Breath, Itching and Swelling  . Pecan Nut (Diagnostic) Anaphylaxis    Home Medications: (Not in a hospital admission)   OB/GYN Status:  Patient's last menstrual period was 12/04/2019 (within months).  General Assessment Data Location of Assessment: [facility] ED TTS Assessment: In system Is this a Tele or Face-to-Face Assessment?: Face-to-Face Is this an Initial Assessment or a Re-assessment for this encounter?: Initial Assessment Patient Accompanied by:: N/A Language Other than English: No Living Arrangements: Other (Comment)(Private residence) What gender do you identify as?: Female Marital status: Single Pregnancy Status: Yes (Comment: include estimated delivery date) Living Arrangements: Children Can pt return to current living arrangement?: Yes Admission Status: Voluntary Is patient capable of signing voluntary admission?: Yes Referral Source: Self/Family/Friend Insurance type: Medicaid  Medical Screening Exam [facility] Walk-in ONLY) Medical Exam completed: Yes  Crisis Care Plan Living Arrangements: Children Legal Guardian: (Self) Name of Psychiatrist: None Name of Therapist: None  Education Status Is patient currently in school?: No Is the patient employed, unemployed or receiving disability?: Unemployed  Risk to self with the past 6 months  Suicidal Ideation: Yes-Currently Present Has patient been a risk to self within the past 6 months prior to admission? : Yes Suicidal Intent: No-Not Currently/Within Last 6 Months Has patient had any suicidal intent within the past 6 months prior to admission? : Yes Is patient at risk for suicide?: Yes Suicidal Plan?: Yes-Currently Present Has patient had any suicidal plan within the past 6 months  prior to admission? : Yes Specify Current Suicidal Plan: Cutting herself Access to Means: Yes Specify Access to Suicidal Means: Access to sharp objects What has been your use of drugs/alcohol within the last 12 months?: Used marijuana today and had alcohol Previous Attempts/Gestures: Yes How many times?: 4 Other Self Harm Risks: Cutting Triggers for Past Attempts: Unknown Intentional Self Injurious Behavior: Cutting Family Suicide History: Unknown Recent stressful life event(s): Other (Comment)(Social Services) Persecutory voices/beliefs?: No Depression: Yes Depression Symptoms: Despondent, Tearfulness, Feeling angry/irritable Substance abuse history and/or treatment for substance abuse?: No Suicide prevention information given to non-admitted patients: Not applicable  Risk to Others within the past 6 months Homicidal Ideation: No Does patient have any lifetime risk of violence toward others beyond the six months prior to admission? : No Thoughts of Harm to Others: No Current Homicidal Intent: No Current Homicidal Plan: No Access to Homicidal Means: No Identified Victim: None identified History of harm to others?: No Assessment of Violence: None Noted Does patient have access to weapons?: No Criminal Charges Pending?: No Does patient have a court date: No Is patient on probation?: No  Psychosis Hallucinations: None noted Delusions: None noted  Mental Status Report Appearance/Hygiene: In scrubs Eye Contact: Fair Motor Activity: Unremarkable Speech: Logical/coherent Level of Consciousness: Alert Mood: Sad Affect: Other (Comment)(Tearful) Anxiety Level: Minimal Thought Processes: Coherent Judgement: Partial Orientation: Appropriate for developmental age Obsessive Compulsive Thoughts/Behaviors: None  Cognitive Functioning Concentration: Poor Memory: Recent Intact Is patient IDD: No Insight: Fair Impulse Control: Fair Appetite: Poor Have you had any weight  changes? : No Change Sleep: Increased  ADLScreening [facility] Assessment Services) Patient's cognitive ability adequate to safely complete daily activities?: Yes Patient able to express need for assistance with ADLs?: Yes Independently performs ADLs?: Yes (appropriate for developmental age)  Prior Inpatient Therapy Prior Inpatient Therapy: Yes Prior Therapy Dates: January 2021 and prior Prior Therapy Facilty/Provider(s): [facility], Kendell Bane Reason for Treatment: Depression and  anxiety  Prior Outpatient Therapy Prior Outpatient Therapy: No Does patient have an ACCT team?: No Does patient have Intensive In-House Services?  : No Does patient have [facility] services? : No Does patient have P4CC services?: No  ADL Screening (condition at time of admission) Patient's cognitive ability adequate to safely complete daily activities?: Yes Is the patient deaf or have difficulty hearing?: No Does the patient have difficulty seeing, even when wearing glasses/contacts?: No Does the patient have difficulty concentrating, remembering, or making decisions?: No Patient able to express need for assistance with ADLs?: Yes Does the patient have difficulty dressing or bathing?: No Independently performs ADLs?: Yes (appropriate for developmental age) Does the patient have difficulty walking or climbing stairs?: No Weakness of Legs: None Weakness of Arms/Hands: None  Home Assistive Devices/Equipment Home Assistive Devices/Equipment: None    Abuse/Neglect Assessment (Assessment to be complete while patient is alone) Abuse/Neglect Assessment Can Be Completed: (Denied history of abuse)     Advance Directives (For Healthcare) Does Patient Have a Medical Advance Directive?: No Would patient like information on creating a medical advance directive?: No - Patient declined  Disposition:  Disposition Initial Assessment Completed for this Encounter: Yes  On Site Evaluation by:   Reviewed with  Physician:    Elmer Bales 04/08/2020 1:19 AM

## 2020-04-08 NOTE — Plan of Care (Signed)
Patient struggling to manage her behavior.  Problem: Education: Goal: Mental status will improve Outcome: Not Progressing Goal: Verbalization of understanding the information provided will improve Outcome: Not Progressing

## 2020-04-08 NOTE — Progress Notes (Signed)
Patient refused initial dose of Prozac.

## 2020-04-08 NOTE — Consult Note (Signed)
[facility] Face-to-Face Psychiatry Consult   Reason for Consult: Mental Health Problems Referring Physician: Dr. York Mejia Patient Identification: [name] MRN:  [id number] Principal Diagnosis: <principal problem not specified> Diagnosis:  Active Problems:   Self-inflicted laceration of wrist (HCC)   MDD (major depressive disorder)   Tobacco use disorder   Stimulant use disorder (HCC) (cocaine)   Alcohol use disorder, moderate, dependence (HCC)   Pregnant   Medically noncompliant   Cocaine use disorder, moderate, dependence (HCC)   Cannabis use disorder, moderate, dependence (HCC)   Abdominal pain   Thoughts of self harm   Total Time spent with patient: 30 minutes  Subjective:  "I have bad depression and bad anxiety.  I was feeling like hurting myself." [name] is a 34 y.o. female patient presented to [facility] ED via law enforcement voluntarily. Per the triage nursing note, The patient voice she has been experiencing severe depression. The patient states she is not on an antidepressant due to her being pregnant.  The patient UDS shows her positive for cocaine and cannabinoid.  The patient reports feeling suicidal tonight "just don't have anything to do it with right now"; the patient voiced she is approximately mo pregnant but unsure of EDC. Per Dr. Pietro Mejia, "[name]'s ultrasound was fine. She's about [redacted] weeks along, so she wasn't far off in her gestational age guess." The patient was seen face-to-face by this provider; chart reviewed and consulted with Dr. York Mejia on 04/08/2020 due to the patient's care. It was discussed with the EDP that the patient does meet the criteria to be admitted to the psychiatric inpatient unit.  The patient is alert and oriented x 4, calm, cooperative, and mood-congruent with affect on evaluation. The patient does not appear to be responding to internal or external stimuli.  She is the patient presenting with delusional thinking. The patient denies  auditory or visual hallucinations. The patient admits to suicidal ideation without a plan but denies homicidal and admits to suicidal and self-harm ideations. The patient is not presenting with any psychotic or paranoid behaviors. During an encounter with the patient, she was able to answer questions appropriately.  Plan: The patient is a safety risk to self and does require psychiatric inpatient admission for stabilization and treatment.   HPI:  Per Dr. Pietro Mejia: [name] is a 34 y.o. female with medical and psychiatric history as listed below who also reports being about 5 months pregnant.  She presents voluntarily for worsening depression, thoughts of self-harm, and hopelessness.  She says she never feels any better.  She stopped taking her psych medication because she says that she "takes it and takes it and takes it but nothing ever helps".  She admits to drinking some alcohol and using marijuana recently but denies any other drugs.  Her symptoms are severe and gradually getting worse since her last psychiatric hospitalization in February.  She denies fever, sore throat, chest pain, shortness of breath, nausea, vomiting, abdominal pain, dysuria, and vaginal bleeding.  Past Psychiatric History:  Anxiety Depression History of substance abuse (HCC) History of suicide attempt  Risk to Self: Suicidal Ideation: Yes-Currently Present Suicidal Intent: No-Not Currently/Within Last 6 Months Is patient at risk for suicide?: Yes Suicidal Plan?: Yes-Currently Present Specify Current Suicidal Plan: Cutting herself Access to Means: Yes Specify Access to Suicidal Means: Access to sharp objects What has been your use of drugs/alcohol within the last 12 months?: Used marijuana today and had alcohol How many times?: 4 Other Self Harm Risks:  Cutting Triggers for Past Attempts: Unknown Intentional Self Injurious Behavior: Cutting Risk to Others: Homicidal Ideation: No Thoughts of Harm to  Others: No Current Homicidal Intent: No Current Homicidal Plan: No Access to Homicidal Means: No Identified Victim: None identified History of harm to others?: No Assessment of Violence: None Noted Does patient have access to weapons?: No Criminal Charges Pending?: No Does patient have a court date: No Prior Inpatient Therapy: Prior Inpatient Therapy: Yes Prior Therapy Dates: January 2021 and prior Prior Therapy Facilty/Provider(s): [facility], [facility] Reason for Treatment: Depression and  anxiety Prior Outpatient Therapy: Prior Outpatient Therapy: No Does patient have an ACCT team?: No Does patient have Intensive In-House Services?  : No Does patient have [facility] services? : No Does patient have P4CC services?: No  Past Medical History:  Past Medical History:  Diagnosis Date  . Anxiety   . Depression   . History of substance abuse (HCC)   . History of suicide attempt   . History of thyroid disease   . Hypertension   . Thyroid disease     Past Surgical History:  Procedure Laterality Date  . CESAREAN SECTION  2000, 2002, 2013   Family History:  Family History  Problem Relation Age of Onset  . Diabetes Mother    Family Psychiatric  History:  Social History:  Social History   Substance and Sexual Activity  Alcohol Use Not Currently   Comment: social drinker     Social History   Substance and Sexual Activity  Drug Use Not Currently  . Types: Cocaine, Marijuana   Comment: Positive UDS 12/2017    Social History   Socioeconomic History  . Marital status: Single    Spouse name: Not on file  . Number of children: Not on file  . Years of education: Not on file  . Highest education level: Not on file  Occupational History  . Not on file  Tobacco Use  . Smoking status: Current Every Day Smoker    Packs/day: 0.50  . Smokeless tobacco: Never Used  Substance and Sexual Activity  . Alcohol use: Not Currently    Comment: social drinker  . Drug use: Not Currently     Types: Cocaine, Marijuana    Comment: Positive UDS 12/2017  . Sexual activity: Not Currently    Birth control/protection: None  Other Topics Concern  . Not on file  Social History Narrative  . Not on file   Social Determinants of Health   Financial Resource Strain:   . Difficulty of Paying Living Expenses:   Food Insecurity:   . Worried About Programme researcher, broadcasting/film/video in the Last Year:   . Barista in the Last Year:   Transportation Needs:   . Freight forwarder (Medical):   Marland Kitchen Lack of Transportation (Non-Medical):   Physical Activity:   . Days of Exercise per Week:   . Minutes of Exercise per Session:   Stress:   . Feeling of Stress :   Social Connections:   . Frequency of Communication with Friends and Family:   . Frequency of Social Gatherings with Friends and Family:   . Attends Religious Services:   . Active Member of Clubs or Organizations:   . Attends Banker Meetings:   Marland Kitchen Marital Status:    Additional Social History:    Allergies:   Allergies  Allergen Reactions  . Aspirin Anaphylaxis, Diarrhea and Nausea And Vomiting  . Banana Anaphylaxis, Itching, Shortness Of Breath and  Swelling  . Other Itching, Shortness Of Breath and Swelling  . Peanut Oil Itching, Shortness Of Breath and Swelling  . Peanut-Containing Drug Products Shortness Of Breath, Itching and Swelling  . Pecan Nut (Diagnostic) Anaphylaxis    Labs:  Results for orders placed or performed during the hospital encounter of 04/07/20 (from the past 48 hour(s))  hCG, quantitative, pregnancy     Status: Abnormal   Collection Time: 04/07/20 11:54 PM  Result Value Ref Range   hCG, Beta Chain, Quant, S 7,623 (H) <5 mIU/mL    Comment:          GEST. AGE      CONC.  (mIU/mL)   <=1 WEEK        5 - 50     2 WEEKS       50 - 500     3 WEEKS       100 - 10,000     4 WEEKS     1,000 - 30,000     5 WEEKS     3,500 - 115,000   6-8 WEEKS     12,000 - 270,000    12 WEEKS     15,000 - 220,000         FEMALE AND NON-PREGNANT FEMALE:     LESS THAN 5 mIU/mL Performed at [facility], [address] [place], [place] [address]   CBC with Differential     Status: Abnormal   Collection Time: 04/07/20 11:54 PM  Result Value Ref Range   WBC 12.9 (H) 4.0 - 10.5 K/uL   RBC 3.71 (L) 3.87 - 5.11 MIL/uL   Hemoglobin 11.0 (L) 12.0 - 15.0 g/dL   HCT 37.3 (L) 42.8 - 76.8 %   MCV 87.9 80.0 - 100.0 fL   MCH 29.6 26.0 - 34.0 pg   MCHC 33.7 30.0 - 36.0 g/dL   RDW 11.5 72.6 - 20.3 %   Platelets 242 150 - 400 K/uL   nRBC 0.0 0.0 - 0.2 %   Neutrophils Relative % 55 %   Neutro Abs 7.1 1.7 - 7.7 K/uL   Lymphocytes Relative 27 %   Lymphs Abs 3.5 0.7 - 4.0 K/uL   Monocytes Relative 5 %   Monocytes Absolute 0.7 0.1 - 1.0 K/uL   Eosinophils Relative 12 %   Eosinophils Absolute 1.6 (H) 0.0 - 0.5 K/uL   Basophils Relative 0 %   Basophils Absolute 0.1 0.0 - 0.1 K/uL   Immature Granulocytes 1 %   Abs Immature Granulocytes 0.11 (H) 0.00 - 0.07 K/uL    Comment: Performed at [facility], [address] [place], [place] [address]  Comprehensive metabolic panel     Status: Abnormal   Collection Time: 04/07/20 11:54 PM  Result Value Ref Range   Sodium 136 135 - 145 mmol/L   Potassium 3.3 (L) 3.5 - 5.1 mmol/L   Chloride 107 98 - 111 mmol/L   CO2 19 (L) 22 - 32 mmol/L   Glucose, Bld 109 (H) 70 - 99 mg/dL    Comment: Glucose reference range applies only to samples taken after fasting for at least 8 hours.   BUN 10 6 - 20 mg/dL   Creatinine, Ser 1.63 0.44 - 1.00 mg/dL   Calcium 8.4 (L) 8.9 - 10.3 mg/dL   Total Protein 7.4 6.5 - 8.1 g/dL   Albumin 3.8 3.5 - 5.0 g/dL   AST 11 (L) 15 - 41 U/L   ALT 12 0 -  44 U/L   Alkaline Phosphatase 56 38 - 126 U/L   Total Bilirubin 0.6 0.3 - 1.2 mg/dL   GFR calc non Af Amer >60 >60 mL/min   GFR calc Af Amer >60 >60 mL/min   Anion gap 10 5 - 15    Comment: Performed at [facility], [address] [place], [place] [address]  Ethanol      Status: Abnormal   Collection Time: 04/07/20 11:54 PM  Result Value Ref Range   Alcohol, Ethyl (B) 26 (H) <10 mg/dL    Comment: (NOTE) Lowest detectable limit for serum alcohol is 10 mg/dL. For medical purposes only. Performed at [facility], [address] [place], [place] [address]   Acetaminophen level     Status: Abnormal   Collection Time: 04/07/20 11:54 PM  Result Value Ref Range   Acetaminophen (Tylenol), Serum <10 (L) 10 - 30 ug/mL    Comment: (NOTE) Therapeutic concentrations vary significantly. A range of 10-30 ug/mL  may be an effective concentration for many patients. However, some  are best treated at concentrations outside of this range. Acetaminophen concentrations >150 ug/mL at 4 hours after ingestion  and >50 ug/mL at 12 hours after ingestion are often associated with  toxic reactions. Performed at [facility], [address] [place], [place] [address]   Salicylate level     Status: Abnormal   Collection Time: 04/07/20 11:54 PM  Result Value Ref Range   Salicylate Lvl <7.0 (L) 7.0 - 30.0 mg/dL    Comment: Performed at [facility], [address] [place], [place] [address]  Urinalysis, Complete w Microscopic     Status: Abnormal   Collection Time: 04/08/20 12:42 AM  Result Value Ref Range   Color, Urine YELLOW (A) YELLOW   APPearance HAZY (A) CLEAR   Specific Gravity, Urine 1.031 (H) 1.005 - 1.030   pH 5.0 5.0 - 8.0   Glucose, UA NEGATIVE NEGATIVE mg/dL   Hgb urine dipstick NEGATIVE NEGATIVE   Bilirubin Urine NEGATIVE NEGATIVE   Ketones, ur NEGATIVE NEGATIVE mg/dL   Protein, ur NEGATIVE NEGATIVE mg/dL   Nitrite NEGATIVE NEGATIVE   Leukocytes,Ua TRACE (A) NEGATIVE   WBC, UA 0-5 0 - 5 WBC/hpf   Bacteria, UA NONE SEEN NONE SEEN   Squamous Epithelial / LPF 11-20 0 - 5   Mucus PRESENT     Comment: Performed at [facility], [address], [place], [place] [address]  Urine Drug Screen, Qualitative ([facility] only)      Status: Abnormal   Collection Time: 04/08/20 12:42 AM  Result Value Ref Range   Tricyclic, Ur Screen NONE DETECTED NONE DETECTED   Amphetamines, Ur Screen NONE DETECTED NONE DETECTED   MDMA (Ecstasy)Ur Screen NONE DETECTED NONE DETECTED   Cocaine Metabolite,Ur [place] POSITIVE (A) NONE DETECTED   Opiate, Ur Screen NONE DETECTED NONE DETECTED   Phencyclidine (PCP) Ur S NONE DETECTED NONE DETECTED   Cannabinoid 50 Ng, Ur [place] POSITIVE (A) NONE DETECTED   Barbiturates, Ur Screen NONE DETECTED NONE DETECTED   Benzodiazepine, Ur Scrn NONE DETECTED NONE DETECTED   Methadone Scn, Ur NONE DETECTED NONE DETECTED    Comment: (NOTE) Tricyclics + metabolites, urine    Cutoff 1000 ng/mL Amphetamines + metabolites, urine  Cutoff 1000 ng/mL MDMA (Ecstasy), urine              Cutoff 500 ng/mL Cocaine Metabolite, urine          Cutoff 300 ng/mL Opiate +  metabolites, urine        Cutoff 300 ng/mL Phencyclidine (PCP), urine         Cutoff 25 ng/mL Cannabinoid, urine                 Cutoff 50 ng/mL Barbiturates + metabolites, urine  Cutoff 200 ng/mL Benzodiazepine, urine              Cutoff 200 ng/mL Methadone, urine                   Cutoff 300 ng/mL The urine drug screen provides only a preliminary, unconfirmed analytical test result and should not be used for non-medical purposes. Clinical consideration and professional judgment should be applied to any positive drug screen result due to possible interfering substances. A more specific alternate chemical method must be used in order to obtain a confirmed analytical result. Gas chromatography / mass spectrometry (GC/MS) is the preferred confirmat ory method. Performed at [facility], [address] [place], [place] [address]   Respiratory Panel by RT PCR (Flu A&B, Covid) - Nasopharyngeal Swab     Status: None   Collection Time: 04/08/20 12:42 AM   Specimen: Nasopharyngeal Swab  Result Value Ref Range   SARS Coronavirus 2 by RT PCR  NEGATIVE NEGATIVE    Comment: (NOTE) SARS-CoV-2 target nucleic acids are NOT DETECTED. The SARS-CoV-2 RNA is generally detectable in upper respiratoy specimens during the acute phase of infection. The lowest concentration of SARS-CoV-2 viral copies this assay can detect is 131 copies/mL. A negative result does not preclude SARS-Cov-2 infection and should not be used as the sole basis for treatment or other patient management decisions. A negative result may occur with  improper specimen collection/handling, submission of specimen other than nasopharyngeal swab, presence of viral mutation(s) within the areas targeted by this assay, and inadequate number of viral copies (<131 copies/mL). A negative result must be combined with clinical observations, patient history, and epidemiological information. The expected result is Negative. Fact Sheet for Patients:  PinkCheek.be Fact Sheet for Healthcare Providers:  GravelBags.it This test is not yet ap proved or cleared by the [place] FDA and  has been authorized for detection and/or diagnosis of SARS-CoV-2 by FDA under an Emergency Use Authorization (EUA). This EUA will remain  in effect (meaning this test can be used) for the duration of the COVID-19 declaration under Section 564(b)(1) of the Act, 21 U.S.C. section 360bbb-3(b)(1), unless the authorization is terminated or revoked sooner.    Influenza A by PCR NEGATIVE NEGATIVE   Influenza B by PCR NEGATIVE NEGATIVE    Comment: (NOTE) The Xpert Xpress SARS-CoV-2/FLU/RSV assay is intended as an aid in  the diagnosis of influenza from Nasopharyngeal swab specimens and  should not be used as a sole basis for treatment. Nasal washings and  aspirates are unacceptable for Xpert Xpress SARS-CoV-2/FLU/RSV  testing. Fact Sheet for Patients: PinkCheek.be Fact Sheet for Healthcare  Providers: GravelBags.it This test is not yet approved or cleared by the [place] FDA and  has been authorized for detection and/or diagnosis of SARS-CoV-2 by  FDA under an Emergency Use Authorization (EUA). This EUA will remain  in effect (meaning this test can be used) for the duration of the  Covid-19 declaration under Section 564(b)(1) of the Act, 21  U.S.C. section 360bbb-3(b)(1), unless the authorization is  terminated or revoked. Performed at [facility], [address], [place], [place] [address]     No current facility-administered medications  for this encounter.   Current Outpatient Medications  Medication Sig Dispense Refill  . Prenatal Vit-Fe Fumarate-FA (PRENATAL MULTIVITAMIN) TABS tablet Take 1 tablet by mouth daily at 12 noon. 30 tablet 1  . pyridOXINE (VITAMIN B-6) 25 MG tablet Take 1 tablet (25 mg total) by mouth daily. 30 tablet 1  . FLUoxetine (PROZAC) 20 MG capsule Take 1 capsule (20 mg total) by mouth daily. (Patient not taking: Reported on 04/08/2020) 30 capsule 1    Musculoskeletal: Strength & Muscle Tone: within normal limits Gait & Station: normal Patient leans: N/A  Psychiatric Specialty Exam: Physical Exam  Nursing note and vitals reviewed. Constitutional: She is oriented to person, place, and time.  Cardiovascular: Normal rate.  Respiratory: Effort normal.  Musculoskeletal:        General: Normal range of motion.     Cervical back: Normal range of motion and neck supple.  Neurological: She is alert and oriented to person, place, and time.    Review of Systems  Psychiatric/Behavioral: Positive for agitation and self-injury. The patient is nervous/anxious.   All other systems reviewed and are negative.   Blood pressure 133/62, pulse 92, temperature 98.3 F (36.8 C), temperature source Oral, resp. rate 18, height 4\' 11"  (1.499 m), weight 74.8 kg, last menstrual period 12/04/2019, SpO2 93 %.Body mass index  is 33.33 kg/m.  General Appearance: Casual  Eye Contact:  Absent  Speech:  Slow  Volume:  Decreased  Mood:  Anxious and Irritable  Affect:  Inappropriate  Thought Process:  Coherent  Orientation:  Full (Time, Place, and Person)  Thought Content:  WDL and Ilusions  Suicidal Thoughts:  Yes.  without intent/plan  Homicidal Thoughts:  No  Memory:  Immediate;   Fair Recent;   Fair Remote;   Fair  Judgement:  Impaired  Insight:  Lacking  Psychomotor Activity:  Normal and Decreased  Concentration:  Concentration: Poor and Attention Span: Poor  Recall:  Poor  Fund of Knowledge:  Fair  Language:  Fair  Akathisia:  Negative  Handed:  Right  AIMS (if indicated):     Assets:  Communication Skills Desire for Improvement Financial Resources/Insurance Physical Health Resilience Social Support  ADL's:  Intact  Cognition:  WNL  Sleep:        Treatment Plan Summary: Medication management and Plan Patient meets criteria for psychiatric inpatient admission.  Disposition: Recommend psychiatric Inpatient admission when medically cleared. Supportive therapy provided about ongoing stressors.  Gillermo MurdochJacqueline Diondre Pulis, NP 04/08/2020 5:57 AM

## 2020-04-08 NOTE — Progress Notes (Signed)
Patient was asleep upon arrival to the unit. During report it was stated that she was given PRN medications this evening for acting out on the unit. Patient has been asleep since this writer got here. Patient is being monitored Q 15 minutes for safety per unit protocol. Patient remains safe on the unit. Pt didn't have any medications scheduled this evening.

## 2020-04-08 NOTE — ED Notes (Signed)
Pt talking to TTS at this time.  ?

## 2020-04-08 NOTE — ED Notes (Signed)
Pt reports she "smoked a blunt earlier," and consumed "4-6 shots" of alcohol.   Pt reports she stopped taking prescribed Prozac, because she was not sure if it was safe to take during pregnancy, as she had been told it was. Pt reports she felt no better taking the Prozac, and that she does not feel like talking to the psychiatrist helps her.   Pt reports she came in tonight because she felt as though she intended to self harm/mutilate by cutting. Pt reports she has done this previously, and had strong intention to do so.   Pt inconsistently reports SI. Sometimes she states explicit SI, sometimes she reports that she does not actually want to kill herself, just to hurt herself.  Pt is tearful and often repeats phrases 2-4 times in a row

## 2020-04-08 NOTE — [facility] Group Notes (Signed)
LCSW Group Therapy Note   04/08/2020 12:37 PM   Type of Therapy and Topic:  Group Therapy:  Overcoming Obstacles   Participation Level:  Did Not Attend   Description of Group:    In this group patients will be encouraged to explore what they see as obstacles to their own wellness and recovery. They will be guided to discuss their thoughts, feelings, and behaviors related to these obstacles. The group will process together ways to cope with barriers, with attention given to specific choices patients can make. Each patient will be challenged to identify changes they are motivated to make in order to overcome their obstacles. This group will be process-oriented, with patients participating in exploration of their own experiences as well as giving and receiving support and challenge from other group members.   Therapeutic Goals: 1. Patient will identify personal and current obstacles as they relate to admission. 2. Patient will identify barriers that currently interfere with their wellness or overcoming obstacles.  3. Patient will identify feelings, thought process and behaviors related to these barriers. 4. Patient will identify two changes they are willing to make to overcome these obstacles:      Summary of Patient Progress x     Therapeutic Modalities:   Cognitive Behavioral Therapy Solution Focused Therapy Motivational Interviewing Relapse Prevention Therapy  Gaylen Pereira, MSW, LCSW Clinical Social Work 04/08/2020 12:37 PM   

## 2020-04-08 NOTE — ED Notes (Signed)
Patient has been accepted to [facility].  Patient assigned to room 309 Accepting physician is Elenore Paddy, NP.  Call report to [phone].  Representative was [employer] [place].   ER Staff is aware of it:  Carlene ER Secretary  Dr. York Cerise, ER MD  Anette Riedel Patient's Nurse

## 2020-04-08 NOTE — ED Notes (Addendum)
Pt provided with cup of water and warm blanket  Pt denies HI, visual/auditory hallucinations

## 2020-04-08 NOTE — [facility] Suicide Risk Assessment (Signed)
[facility] Admission Suicide Risk Assessment   Nursing information obtained from:    Demographic factors:  Low socioeconomic status Current Mental Status:  Self-harm thoughts Loss Factors:  Financial problems / change in socioeconomic status Historical Factors:  Prior suicide attempts Risk Reduction Factors:  Pregnancy, Responsible for children under 35 years of age  Total Time spent with patient: 1 hour Principal Problem: MDD (major depressive disorder), recurrent episode, severe ([place]) Diagnosis:  Principal Problem:   MDD (major depressive disorder), recurrent episode, severe ([place]) Active Problems:   Stimulant use disorder ([place]) (cocaine)   Alcohol use disorder, moderate, dependence ([place])   Pregnant   Medically noncompliant  Subjective Data: Patient known from previous encounters presents to the emergency room voluntarily with depressed mood suicidal ideation hopelessness and relapse into abuse of alcohol and cocaine  Continued Clinical Symptoms:    The "Alcohol Use Disorders Identification Test", Guidelines for Use in Primary Care, Second Edition.  World Pharmacologist [facility]). Score between 0-7:  no or low risk or alcohol related problems. Score between 8-15:  moderate risk of alcohol related problems. Score between 16-19:  high risk of alcohol related problems. Score 20 or above:  warrants further diagnostic evaluation for alcohol dependence and treatment.   CLINICAL FACTORS:   Depression:   Comorbid alcohol abuse/dependence Alcohol/Substance Abuse/Dependencies   Musculoskeletal: Strength & Muscle Tone: within normal limits Gait & Station: normal Patient leans: N/A  Psychiatric Specialty Exam: Physical Exam  Nursing note and vitals reviewed. Constitutional: She appears well-developed and well-nourished.  HENT:  Head: Normocephalic and atraumatic.  Eyes: Pupils are equal, round, and reactive to light. Conjunctivae are normal.  Cardiovascular: Regular rhythm and normal  heart sounds.  Respiratory: Effort normal. No respiratory distress.  GI: Soft.    Musculoskeletal:        General: Normal range of motion.     Cervical back: Normal range of motion.  Neurological: She is alert.  Skin: Skin is warm and dry.  Psychiatric: Her affect is blunt. Her speech is delayed. She is slowed and withdrawn. Cognition and memory are impaired. She expresses impulsivity. She exhibits a depressed mood. She expresses suicidal ideation. She expresses no suicidal plans.    Review of Systems  Constitutional: Negative.   HENT: Negative.   Eyes: Negative.   Respiratory: Negative.   Cardiovascular: Negative.   Gastrointestinal: Negative.   Musculoskeletal: Negative.   Skin: Negative.   Neurological: Negative.   Psychiatric/Behavioral: Positive for dysphoric mood, sleep disturbance and suicidal ideas. The patient is nervous/anxious.     Blood pressure 138/86, pulse 84, temperature 98.5 F (36.9 C), temperature source Oral, resp. rate 18, height 4\' 11"  (1.499 m), weight 75 kg, last menstrual period 12/04/2019, SpO2 100 %.Body mass index is 33.4 kg/m.  General Appearance: Disheveled  Eye Contact:  Minimal  Speech:  Slow  Volume:  Decreased  Mood:  Depressed  Affect:  Congruent  Thought Process:  Coherent  Orientation:  Full (Time, Place, and Person)  Thought Content:  Logical, Rumination and Tangential  Suicidal Thoughts:  Yes.  without intent/plan  Homicidal Thoughts:  No  Memory:  Immediate;   Fair Recent;   Poor Remote;   Fair  Judgement:  Impaired  Insight:  Shallow  Psychomotor Activity:  Decreased  Concentration:  Concentration: Fair  Recall:  [employer] of Knowledge:  Fair  Language:  Fair  Akathisia:  No  Handed:  Right  AIMS (if indicated):     Assets:  Desire for Improvement Resilience  ADL's:  Impaired  Cognition:  Impaired,  Mild  Sleep:         COGNITIVE FEATURES THAT CONTRIBUTE TO RISK:  Thought constriction (tunnel vision)    SUICIDE  RISK:   Mild:  Suicidal ideation of limited frequency, intensity, duration, and specificity.  There are no identifiable plans, no associated intent, mild dysphoria and related symptoms, good self-control (both objective and subjective assessment), few other risk factors, and identifiable protective factors, including available and accessible social support.  PLAN OF CARE: Restart appropriate antidepressant medicine as well as treatment for her pregnancy.  Encouraged her to be eating better.  Up out of bed and interact with others go to groups.  Work on assuring she has motivation for outpatient treatment at discharge  I certify that inpatient services furnished can reasonably be expected to improve the patient's condition.   Mordecai Rasmussen, MD 04/08/2020, 4:05 PM

## 2020-04-08 NOTE — ED Provider Notes (Signed)
[facility] Emergency Department Provider Note  ____________________________________________   First MD Initiated Contact with Patient 04/07/20 2356     (approximate)  I have reviewed the triage vital signs and the nursing notes.   HISTORY  Chief Complaint Mental Health Problem    HPI [name] is a 34 y.o. female with medical and psychiatric history as listed below who also reports being about 5 months pregnant.  She presents voluntarily for worsening depression, thoughts of self-harm, and hopelessness.  She says she never feels any better.  She stopped taking her psych medication because she says that she "takes it and takes it and takes it but nothing ever helps".  She admits to drinking some alcohol and using marijuana recently but denies any other drugs.  Her symptoms are severe and gradually getting worse since her last psychiatric hospitalization in February.  She denies fever, sore throat, chest pain, shortness of breath, nausea, vomiting, abdominal pain, dysuria, and vaginal bleeding.         Past Medical History:  Diagnosis Date  . Anxiety   . Depression   . History of substance abuse (HCC)   . History of suicide attempt   . History of thyroid disease   . Hypertension   . Thyroid disease     Patient Active Problem List   Diagnosis Date Noted  . Thoughts of self harm 02/01/2020  . Abdominal pain 03/20/2018  . Cocaine use disorder, moderate, dependence (HCC) 01/01/2018  . Cannabis use disorder, moderate, dependence (HCC) 01/01/2018  . Pregnant 12/31/2017  . Medically noncompliant 12/31/2017  . Tobacco use disorder 01/12/2016  . Stimulant use disorder (HCC) (cocaine) 01/12/2016  . Alcohol use disorder, moderate, dependence (HCC) 01/12/2016  . Self-inflicted laceration of wrist (HCC) 01/11/2016  . MDD (major depressive disorder) 01/11/2016    Past Surgical History:  Procedure Laterality Date  . CESAREAN SECTION  2000,  2002, 2013    Prior to Admission medications   Medication Sig Start Date End Date Taking? Authorizing Provider  Prenatal Vit-Fe Fumarate-FA (PRENATAL MULTIVITAMIN) TABS tablet Take 1 tablet by mouth daily at 12 noon. 02/06/20  Yes Money, Gerlene Burdock, FNP  pyridOXINE (VITAMIN B-6) 25 MG tablet Take 1 tablet (25 mg total) by mouth daily. 02/06/20  Yes Money, Gerlene Burdock, FNP  FLUoxetine (PROZAC) 20 MG capsule Take 1 capsule (20 mg total) by mouth daily. Patient not taking: Reported on 04/08/2020 02/06/20   Money, Gerlene Burdock, FNP    Allergies Aspirin, Banana, Other, Peanut oil, Peanut-containing drug products, and Pecan nut (diagnostic)  Family History  Problem Relation Age of Onset  . Diabetes Mother     Social History Social History   Tobacco Use  . Smoking status: Current Every Day Smoker    Packs/day: 0.50  . Smokeless tobacco: Never Used  Substance Use Topics  . Alcohol use: Not Currently    Comment: social drinker  . Drug use: Not Currently    Types: Cocaine, Marijuana    Comment: Positive UDS 12/2017    Review of Systems Constitutional: No fever/chills Eyes: No visual changes. ENT: No sore throat. Cardiovascular: Denies chest pain. Respiratory: Denies shortness of breath. Gastrointestinal: No abdominal pain.  No nausea, no vomiting.  No diarrhea.  No constipation. Genitourinary: Negative for dysuria.  Negative for vaginal bleeding. Musculoskeletal: Negative for neck pain.  Negative for back pain. Integumentary: Negative for rash. Neurological: Negative for headaches, focal weakness or numbness. Psychiatric:  Depression, thoughts of self-harm, hopelessness.  ____________________________________________  PHYSICAL EXAM:  VITAL SIGNS: ED Triage Vitals  Enc Vitals Group     BP 04/07/20 2343 133/62     Pulse Rate 04/07/20 2343 92     Resp 04/07/20 2343 18     Temp 04/07/20 2343 98.3 F (36.8 C)     Temp Source 04/07/20 2343 Oral     SpO2 04/07/20 2343 93 %     Weight  04/07/20 2349 74.8 kg (165 lb)     Height 04/07/20 2349 1.499 m (4\' 11" )     Head Circumference --      Peak Flow --      Pain Score 04/07/20 2349 0     Pain Loc --      Pain Edu? --      Excl. in GC? --     Constitutional: Alert and oriented.  Eyes: Conjunctivae are normal.  Head: Atraumatic. Nose: No congestion/rhinnorhea. Mouth/Throat: Patient is wearing a mask. Neck: No stridor.  No meningeal signs.   Cardiovascular: Normal rate, regular rhythm. Good peripheral circulation. Grossly normal heart sounds. Respiratory: Normal respiratory effort.  No retractions. Gastrointestinal: Soft and nontender. No distention.  Musculoskeletal: No lower extremity tenderness nor edema. No gross deformities of extremities. Neurologic:  Normal speech and language. No gross focal neurologic deficits are appreciated.  Skin:  Skin is warm, dry and intact. Psychiatric: Mood and affect are depressed and the patient is crying softly and easily agitated with my questions.  Admits to depression and hopelessness, denies wanting to die but admits to having thoughts of harming herself.  Noncompliant with medications.  ____________________________________________   LABS (all labs ordered are listed, but only abnormal results are displayed)  Labs Reviewed  URINALYSIS, COMPLETE (UACMP) WITH MICROSCOPIC - Abnormal; Notable for the following components:      Result Value   Color, Urine YELLOW (*)    APPearance HAZY (*)    Specific Gravity, Urine 1.031 (*)    Leukocytes,Ua TRACE (*)    All other components within normal limits  URINE DRUG SCREEN, QUALITATIVE ([facility] ONLY) - Abnormal; Notable for the following components:   Cocaine Metabolite,Ur [place] POSITIVE (*)    Cannabinoid 50 Ng, Ur [place] POSITIVE (*)    All other components within normal limits  HCG, QUANTITATIVE, PREGNANCY - Abnormal; Notable for the following components:   hCG, Beta Chain, Quant, S 7,623 (*)    All other components within normal limits    CBC WITH DIFFERENTIAL/PLATELET - Abnormal; Notable for the following components:   WBC 12.9 (*)    RBC 3.71 (*)    Hemoglobin 11.0 (*)    HCT 32.6 (*)    Eosinophils Absolute 1.6 (*)    Abs Immature Granulocytes 0.11 (*)    All other components within normal limits  COMPREHENSIVE METABOLIC PANEL - Abnormal; Notable for the following components:   Potassium 3.3 (*)    CO2 19 (*)    Glucose, Bld 109 (*)    Calcium 8.4 (*)    AST 11 (*)    All other components within normal limits  ETHANOL - Abnormal; Notable for the following components:   Alcohol, Ethyl (B) 26 (*)    All other components within normal limits  ACETAMINOPHEN LEVEL - Abnormal; Notable for the following components:   Acetaminophen (Tylenol), Serum <10 (*)    All other components within normal limits  SALICYLATE LEVEL - Abnormal; Notable for the following components:   Salicylate Lvl <7.0 (*)    All other components within normal  limits  RESPIRATORY PANEL BY RT PCR (FLU A&B, COVID)   ____________________________________________  EKG  No indication for EKG ____________________________________________  RADIOLOGY I, Hinda Kehr, personally viewed and evaluated these images (plain radiographs) as part of my medical decision making, as well as reviewing the written report by the radiologist.  ED MD interpretation: No indication for emergent imaging  Official radiology report(s): US OB Limited > 14 wks  Result Date: 04/08/2020 CLINICAL DATA:  Substance abuse, decreased fetal movement EXAM: LIMITED OBSTETRIC ULTRASOUND FINDINGS: Number of Fetuses: 1 Heart Rate:  155 bpm Movement: Yes Presentation: Cephalic Placental Location: Anterior Previa: No Amniotic Fluid (Subjective):  Within normal limits. BPD: 4.0 cm 18 w  1 d MATERNAL FINDINGS: Cervix:  Appears closed. Uterus/Adnexae: Right ovary measures 3.0 x 1.6 x 2.0 cm and the left ovary measures 2.8 x 1.9 x 2.4 cm. No free fluid. IMPRESSION: 1. Single live intrauterine  pregnancy as above, estimated age 7 weeks and 1 day. This exam is performed on an emergent basis and does not comprehensively evaluate fetal size, dating, or anatomy; follow-up complete OB US should be considered if further fetal assessment is warranted. Electronically Signed   By: Randa Ngo M.D.   On: 04/08/2020 02:36    ____________________________________________   PROCEDURES   Procedure(s) performed (including Critical Care):  Procedures   ____________________________________________   INITIAL IMPRESSION / MDM / [place] / ED COURSE  As part of my medical decision making, I reviewed the following data within the [place] notes reviewed and incorporated, Labs reviewed , Old chart reviewed, A consult was requested and obtained from this/these consultant(s) Psychiatry, Notes from prior ED visits and [place] Controlled Substance Database   Differential diagnosis includes, but is not limited to, depression, substance-induced mood disorder, polysubstance abuse, alcohol intoxication.  The patient does not have any medical complaints or concerns and does not seem to be having any pregnancy related issues.  However she is at high risk given her psychiatric history and her pregnancy as well as her noncompliance with medication and her substance abuse.  I have consulted psychiatry.  She is here voluntarily and she is not actively endorsing suicidal ideation and I do not think it is necessary to placed under involuntary commitment.  Once she has lab results anticipate she will be medically cleared for psychiatric disposition.       Clinical Course as of Apr 08 520  Thu Apr 08, 2020  0103 Labs reassuring, no specific abnormalities and no emergent medical conditions.  The patient has been placed in psychiatric observation due to the need to provide a safe environment for the patient while obtaining psychiatric consultation and evaluation, as well as ongoing  medical and medication management to treat the patient's condition. The patient has not been placed under full IVC at this time.   [CF]  0104 No evidence of infection.  Squamous cells (11-20) are visualized, but no infectious markers.  Urinalysis, Complete w Microscopic(!) [CF]  0105 HCG lower than anticipated.  Given ongoing issues with substance abuse and depression, will obtain ultrasound to verify fetal viability.  HCG, Beta Chain, Laqueta Carina(!): 7,623 [CF]  0232 Cocaine Metabolite,Ur [place](!): POSITIVE [CF]  0232 Cannabinoid 77 Ng, Ur [place](!): POSITIVE [CF]  4315 Kennyth Lose with psychiatry evaluated the patient and will admit her for additional psychiatric care.   [CF]  0302 Reassuring ultrasound demonstrating single live IUP at about [redacted] weeks gestation.  US OB Limited > 14 wks [CF]  4008  Deanna Artis with TTS verified the plan to admit this patient to [facility].   [CF]    Clinical Course User Index [CF] Loleta Rose, MD     ____________________________________________  FINAL CLINICAL IMPRESSION(S) / ED DIAGNOSES  Final diagnoses:  Episode of recurrent major depressive disorder, unspecified depression episode severity (HCC)  Second trimester pregnancy  Cocaine abuse (HCC)  Marijuana use  Alcohol use affecting pregnancy in second trimester     MEDICATIONS GIVEN DURING THIS VISIT:  Medications - No data to display   ED Discharge Orders    None      *Please note:  [name] was evaluated in Emergency Department on 04/08/2020 for the symptoms described in the history of present illness. She was evaluated in the context of the global COVID-19 pandemic, which necessitated consideration that the patient might be at risk for infection with the SARS-CoV-2 virus that causes COVID-19. Institutional protocols and algorithms that pertain to the evaluation of patients at risk for COVID-19 are in a state of rapid change based on information released by regulatory bodies  including the CDC and federal and state organizations. These policies and algorithms were followed during the patient's care in the ED.  Some ED evaluations and interventions may be delayed as a result of limited staffing during the pandemic.*  Note:  This document was prepared using Dragon voice recognition software and may include unintentional dictation errors.   Loleta Rose, MD 04/08/20 [phone]

## 2020-04-08 NOTE — ED Notes (Signed)
Report called to Mac, RN for next shift. Per unit, they are tied up with 0600 rounds and will not be able to take pt until after shift change 

## 2020-04-08 NOTE — H&P (Signed)
Psychiatric Admission Assessment Adult  Patient Identification: Laren Orama Armijo MRN:  [id number] Date of Evaluation:  04/08/2020 Chief Complaint:  MDD (major depressive disorder), recurrent episode, severe ([place]) [F33.2] Principal Diagnosis: MDD (major depressive disorder), recurrent episode, severe ([place]) Diagnosis:  Principal Problem:   MDD (major depressive disorder), recurrent episode, severe ([place]) Active Problems:   Stimulant use disorder ([place]) (cocaine)   Alcohol use disorder, moderate, dependence ([place])   Pregnant   Medically noncompliant  History of Present Illness: Patient seen and chart reviewed.  34 year old woman with a history of depression and substance abuse presented voluntarily to the emergency room reporting multiple symptoms of depression.  She tells me that her mood feels terrible.  She feels down and sad and negative all the time.  She feels hopeless.  Claims that her family has abandoned her and rejected her and she has no place to live.  She has been sleeping poorly and not eating well at all.  Patient has reported suicidal thoughts without specific plan.  Denies any current hallucinations.  She admits that she has been using alcohol and cocaine but claims that she only relapsed within the last day.  She admits that she has not been compliant with her recommended previous outpatient treatment or medicine. Associated Signs/Symptoms: Depression Symptoms:  depressed mood, psychomotor retardation, feelings of worthlessness/guilt, difficulty concentrating, hopelessness, suicidal thoughts without plan, (Hypo) Manic Symptoms:  Distractibility, Anxiety Symptoms:  Excessive Worry, Psychotic Symptoms:  Denies any PTSD Symptoms: Patient has a history of trauma in the past but has not had PTSD diagnosis Total Time spent with patient: 1 hour  Past Psychiatric History: Past history of multiple presentations for treatment of similar symptoms depression and substance abuse.   Recently discharged from the hospital here in February.  Patient has been treated with antidepressant medicine in the past and has some history of outpatient treatment but often is noncompliant.  Is the patient at risk to self? Yes.    Has the patient been a risk to self in the past 6 months? Yes.    Has the patient been a risk to self within the distant past? Yes.    Is the patient a risk to others? No.  Has the patient been a risk to others in the past 6 months? No.  Has the patient been a risk to others within the distant past? No.   Prior Inpatient Therapy:   Prior Outpatient Therapy:    Alcohol Screening:   Substance Abuse History in the last 12 months:  Yes.   Consequences of Substance Abuse: Complications of her current pregnancy Previous Psychotropic Medications: Yes  Psychological Evaluations: Yes  Past Medical History:  Past Medical History:  Diagnosis Date  . Anxiety   . Depression   . History of substance abuse ([place])   . History of suicide attempt   . History of thyroid disease   . Hypertension   . Thyroid disease     Past Surgical History:  Procedure Laterality Date  . CESAREAN SECTION  2000, 2002, 2013   Family History:  Family History  Problem Relation Age of Onset  . Diabetes Mother    Family Psychiatric  History: See previous notes.  None reported Tobacco Screening:   Social History:  Social History   Substance and Sexual Activity  Alcohol Use Not Currently   Comment: social drinker     Social History   Substance and Sexual Activity  Drug Use Not Currently  . Types: Cocaine, Marijuana   Comment:  Positive UDS 12/2017    Additional Social History:                           Allergies:   Allergies  Allergen Reactions  . Aspirin Anaphylaxis, Diarrhea and Nausea And Vomiting  . Banana Anaphylaxis, Itching, Shortness Of Breath and Swelling  . Other Itching, Shortness Of Breath and Swelling  . Peanut Oil Itching, Shortness Of Breath  and Swelling  . Peanut-Containing Drug Products Shortness Of Breath, Itching and Swelling  . Pecan Nut (Diagnostic) Anaphylaxis   Lab Results:  Results for orders placed or performed during the hospital encounter of 04/07/20 (from the past 48 hour(s))  hCG, quantitative, pregnancy     Status: Abnormal   Collection Time: 04/07/20 11:54 PM  Result Value Ref Range   hCG, Beta Chain, Quant, S 7,623 (H) <5 mIU/mL    Comment:          GEST. AGE      CONC.  (mIU/mL)   <=1 WEEK        5 - 50     2 WEEKS       50 - 500     3 WEEKS       100 - 10,000     4 WEEKS     1,000 - 30,000     5 WEEKS     3,500 - 115,000   6-8 WEEKS     12,000 - 270,000    12 WEEKS     15,000 - 220,000        FEMALE AND NON-PREGNANT FEMALE:     LESS THAN 5 mIU/mL Performed at [facility], [address] [place], [place] [address]   CBC with Differential     Status: Abnormal   Collection Time: 04/07/20 11:54 PM  Result Value Ref Range   WBC 12.9 (H) 4.0 - 10.5 K/uL   RBC 3.71 (L) 3.87 - 5.11 MIL/uL   Hemoglobin 11.0 (L) 12.0 - 15.0 g/dL   HCT 60.4 (L) 54.0 - 98.1 %   MCV 87.9 80.0 - 100.0 fL   MCH 29.6 26.0 - 34.0 pg   MCHC 33.7 30.0 - 36.0 g/dL   RDW 19.1 47.8 - 29.5 %   Platelets 242 150 - 400 K/uL   nRBC 0.0 0.0 - 0.2 %   Neutrophils Relative % 55 %   Neutro Abs 7.1 1.7 - 7.7 K/uL   Lymphocytes Relative 27 %   Lymphs Abs 3.5 0.7 - 4.0 K/uL   Monocytes Relative 5 %   Monocytes Absolute 0.7 0.1 - 1.0 K/uL   Eosinophils Relative 12 %   Eosinophils Absolute 1.6 (H) 0.0 - 0.5 K/uL   Basophils Relative 0 %   Basophils Absolute 0.1 0.0 - 0.1 K/uL   Immature Granulocytes 1 %   Abs Immature Granulocytes 0.11 (H) 0.00 - 0.07 K/uL    Comment: Performed at [facility], [address] [place], [place] [address]  Comprehensive metabolic panel     Status: Abnormal   Collection Time: 04/07/20 11:54 PM  Result Value Ref Range   Sodium 136 135 - 145 mmol/L   Potassium 3.3 (L) 3.5 - 5.1 mmol/L    Chloride 107 98 - 111 mmol/L   CO2 19 (L) 22 - 32 mmol/L   Glucose, Bld 109 (H) 70 - 99 mg/dL    Comment: Glucose reference range applies only to samples taken after fasting for at  least 8 hours.   BUN 10 6 - 20 mg/dL   Creatinine, Ser 7.59 0.44 - 1.00 mg/dL   Calcium 8.4 (L) 8.9 - 10.3 mg/dL   Total Protein 7.4 6.5 - 8.1 g/dL   Albumin 3.8 3.5 - 5.0 g/dL   AST 11 (L) 15 - 41 U/L   ALT 12 0 - 44 U/L   Alkaline Phosphatase 56 38 - 126 U/L   Total Bilirubin 0.6 0.3 - 1.2 mg/dL   GFR calc non Af Amer >60 >60 mL/min   GFR calc Af Amer >60 >60 mL/min   Anion gap 10 5 - 15    Comment: Performed at [facility], [address] [place], [place] [address]  Ethanol     Status: Abnormal   Collection Time: 04/07/20 11:54 PM  Result Value Ref Range   Alcohol, Ethyl (B) 26 (H) <10 mg/dL    Comment: (NOTE) Lowest detectable limit for serum alcohol is 10 mg/dL. For medical purposes only. Performed at [facility], [address] [place], [place] [address]   Acetaminophen level     Status: Abnormal   Collection Time: 04/07/20 11:54 PM  Result Value Ref Range   Acetaminophen (Tylenol), Serum <10 (L) 10 - 30 ug/mL    Comment: (NOTE) Therapeutic concentrations vary significantly. A range of 10-30 ug/mL  may be an effective concentration for many patients. However, some  are best treated at concentrations outside of this range. Acetaminophen concentrations >150 ug/mL at 4 hours after ingestion  and >50 ug/mL at 12 hours after ingestion are often associated with  toxic reactions. Performed at [facility], [address] [place], [place] [address]   Salicylate level     Status: Abnormal   Collection Time: 04/07/20 11:54 PM  Result Value Ref Range   Salicylate Lvl <7.0 (L) 7.0 - 30.0 mg/dL    Comment: Performed at [facility], [address] [place], [place] [address]  Urinalysis, Complete w Microscopic     Status: Abnormal   Collection Time:  04/08/20 12:42 AM  Result Value Ref Range   Color, Urine YELLOW (A) YELLOW   APPearance HAZY (A) CLEAR   Specific Gravity, Urine 1.031 (H) 1.005 - 1.030   pH 5.0 5.0 - 8.0   Glucose, UA NEGATIVE NEGATIVE mg/dL   Hgb urine dipstick NEGATIVE NEGATIVE   Bilirubin Urine NEGATIVE NEGATIVE   Ketones, ur NEGATIVE NEGATIVE mg/dL   Protein, ur NEGATIVE NEGATIVE mg/dL   Nitrite NEGATIVE NEGATIVE   Leukocytes,Ua TRACE (A) NEGATIVE   WBC, UA 0-5 0 - 5 WBC/hpf   Bacteria, UA NONE SEEN NONE SEEN   Squamous Epithelial / LPF 11-20 0 - 5   Mucus PRESENT     Comment: Performed at [facility], [address] [place], [place] [address]  Urine Drug Screen, Qualitative ([facility] only)     Status: Abnormal   Collection Time: 04/08/20 12:42 AM  Result Value Ref Range   Tricyclic, Ur Screen NONE DETECTED NONE DETECTED   Amphetamines, Ur Screen NONE DETECTED NONE DETECTED   MDMA (Ecstasy)Ur Screen NONE DETECTED NONE DETECTED   Cocaine Metabolite,Ur [place] POSITIVE (A) NONE DETECTED   Opiate, Ur Screen NONE DETECTED NONE DETECTED   Phencyclidine (PCP) Ur S NONE DETECTED NONE DETECTED   Cannabinoid 50 Ng, Ur  POSITIVE (A) NONE DETECTED   Barbiturates, Ur Screen NONE DETECTED NONE DETECTED   Benzodiazepine, Ur Scrn NONE DETECTED NONE DETECTED   Methadone Scn, Ur NONE DETECTED NONE  DETECTED    Comment: (NOTE) Tricyclics + metabolites, urine    Cutoff 1000 ng/mL Amphetamines + metabolites, urine  Cutoff 1000 ng/mL MDMA (Ecstasy), urine              Cutoff 500 ng/mL Cocaine Metabolite, urine          Cutoff 300 ng/mL Opiate + metabolites, urine        Cutoff 300 ng/mL Phencyclidine (PCP), urine         Cutoff 25 ng/mL Cannabinoid, urine                 Cutoff 50 ng/mL Barbiturates + metabolites, urine  Cutoff 200 ng/mL Benzodiazepine, urine              Cutoff 200 ng/mL Methadone, urine                   Cutoff 300 ng/mL The urine drug screen provides only a preliminary, unconfirmed analytical test  result and should not be used for non-medical purposes. Clinical consideration and professional judgment should be applied to any positive drug screen result due to possible interfering substances. A more specific alternate chemical method must be used in order to obtain a confirmed analytical result. Gas chromatography / mass spectrometry (GC/MS) is the preferred confirmat ory method. Performed at [facility], [address] [place], [place] [address]   Respiratory Panel by RT PCR (Flu A&B, Covid) - Nasopharyngeal Swab     Status: None   Collection Time: 04/08/20 12:42 AM   Specimen: Nasopharyngeal Swab  Result Value Ref Range   SARS Coronavirus 2 by RT PCR NEGATIVE NEGATIVE    Comment: (NOTE) SARS-CoV-2 target nucleic acids are NOT DETECTED. The SARS-CoV-2 RNA is generally detectable in upper respiratoy specimens during the acute phase of infection. The lowest concentration of SARS-CoV-2 viral copies this assay can detect is 131 copies/mL. A negative result does not preclude SARS-Cov-2 infection and should not be used as the sole basis for treatment or other patient management decisions. A negative result may occur with  improper specimen collection/handling, submission of specimen other than nasopharyngeal swab, presence of viral mutation(s) within the areas targeted by this assay, and inadequate number of viral copies (<131 copies/mL). A negative result must be combined with clinical observations, patient history, and epidemiological information. The expected result is Negative. Fact Sheet for Patients:  https://www.moore.com/ Fact Sheet for Healthcare Providers:  https://www.young.biz/ This test is not yet ap proved or cleared by the [place] FDA and  has been authorized for detection and/or diagnosis of SARS-CoV-2 by FDA under an Emergency Use Authorization (EUA). This EUA will remain  in effect (meaning this test can be  used) for the duration of the COVID-19 declaration under Section 564(b)(1) of the Act, 21 U.S.C. section 360bbb-3(b)(1), unless the authorization is terminated or revoked sooner.    Influenza A by PCR NEGATIVE NEGATIVE   Influenza B by PCR NEGATIVE NEGATIVE    Comment: (NOTE) The Xpert Xpress SARS-CoV-2/FLU/RSV assay is intended as an aid in  the diagnosis of influenza from Nasopharyngeal swab specimens and  should not be used as a sole basis for treatment. Nasal washings and  aspirates are unacceptable for Xpert Xpress SARS-CoV-2/FLU/RSV  testing. Fact Sheet for Patients: https://www.moore.com/ Fact Sheet for Healthcare Providers: https://www.young.biz/ This test is not yet approved or cleared by the [place] FDA and  has been authorized for detection and/or diagnosis of SARS-CoV-2 by  FDA under an Emergency Use Authorization (EUA).  This EUA will remain  in effect (meaning this test can be used) for the duration of the  Covid-19 declaration under Section 564(b)(1) of the Act, 21  U.S.C. section 360bbb-3(b)(1), unless the authorization is  terminated or revoked. Performed at [facility], [address] [place], [place] [id number]     [facility] level:  Lab Results  Component Value Date   ETH 26 (H) 04/07/2020   ETH <10 02/01/2020    Metabolic Disorder Labs:  Lab Results  Component Value Date   HGBA1C 5.1 01/02/2018   MPG 99.67 01/02/2018   No results found for: PROLACTIN Lab Results  Component Value Date   CHOL 218 (H) 01/02/2018   TRIG 187 (H) 01/02/2018   HDL 81 01/02/2018   CHOLHDL 2.7 01/02/2018   VLDL 37 01/02/2018   LDLCALC 100 (H) 01/02/2018   LDLCALC 82 01/12/2016    Current Medications: Current Facility-Administered Medications  Medication Dose Route Frequency Provider Last Rate Last Admin  . acetaminophen (TYLENOL) tablet 650 mg  650 mg Oral Q6H PRN Gillermo Murdochhompson, Jacqueline, NP   650 mg at 04/08/20  1148  . alum & mag hydroxide-simeth (MAALOX/MYLANTA) 200-200-20 MG/5ML suspension 30 mL  30 mL Oral Q4H PRN Gillermo Murdochhompson, Jacqueline, NP      . FLUoxetine (PROZAC) capsule 20 mg  20 mg Oral Daily Yassen Kinnett T, MD      . magnesium hydroxide (MILK OF MAGNESIA) suspension 30 mL  30 mL Oral Daily PRN Gillermo Murdochhompson, Jacqueline, NP      . prenatal multivitamin tablet 1 tablet  1 tablet Oral Q1200 Gillermo Murdochhompson, Jacqueline, NP   1 tablet at 04/08/20 1148  . pyridOXINE (VITAMIN B-6) tablet 25 mg  25 mg Oral Daily Gillermo Murdochhompson, Jacqueline, NP   25 mg at 04/08/20 1316   PTA Medications: Medications Prior to Admission  Medication Sig Dispense Refill Last Dose  . FLUoxetine (PROZAC) 20 MG capsule Take 1 capsule (20 mg total) by mouth daily. (Patient not taking: Reported on 04/08/2020) 30 capsule 1   . Prenatal Vit-Fe Fumarate-FA (PRENATAL MULTIVITAMIN) TABS tablet Take 1 tablet by mouth daily at 12 noon. 30 tablet 1   . pyridOXINE (VITAMIN B-6) 25 MG tablet Take 1 tablet (25 mg total) by mouth daily. 30 tablet 1     Musculoskeletal: Strength & Muscle Tone: within normal limits Gait & Station: normal Patient leans: N/A  Psychiatric Specialty Exam: Physical Exam  Nursing note and vitals reviewed. Constitutional: She appears well-developed and well-nourished.  HENT:  Head: Normocephalic and atraumatic.  Eyes: Pupils are equal, round, and reactive to light. Conjunctivae are normal.  Cardiovascular: Regular rhythm and normal heart sounds.  Respiratory: Effort normal.  GI: Soft.  Musculoskeletal:        General: Normal range of motion.     Cervical back: Normal range of motion.  Neurological: She is alert.  Skin: Skin is warm and dry.  Psychiatric: Her speech is delayed. She is slowed. Cognition and memory are impaired. She expresses impulsivity. She exhibits a depressed mood. She expresses suicidal ideation. She expresses no suicidal plans.    Review of Systems  Constitutional: Negative.   HENT: Negative.    Eyes: Negative.   Respiratory: Negative.   Cardiovascular: Negative.   Gastrointestinal: Negative.   Musculoskeletal: Negative.   Skin: Negative.   Neurological: Negative.   Psychiatric/Behavioral: Positive for dysphoric mood, sleep disturbance and suicidal ideas.    Blood pressure 138/86, pulse 84, temperature 98.5 F (36.9 C), temperature source Oral, resp. rate  18, height 4\' 11"  (1.499 m), weight 75 kg, last menstrual period 12/04/2019, SpO2 100 %.Body mass index is 33.4 kg/m.  General Appearance: Disheveled  Eye Contact:  Minimal  Speech:  Slow and Slurred  Volume:  Decreased  Mood:  Depressed  Affect:  Congruent and Depressed  Thought Process:  Coherent  Orientation:  Full (Time, Place, and Person)  Thought Content:  Logical, Paranoid Ideation, Rumination and Tangential  Suicidal Thoughts:  Yes.  without intent/plan  Homicidal Thoughts:  No  Memory:  Immediate;   Fair Recent;   Fair Remote;   Fair  Judgement:  Impaired  Insight:  Shallow  Psychomotor Activity:  Decreased  Concentration:  Concentration: Fair  Recall:  14/09/2019 of Knowledge:  Fair  Language:  Fair  Akathisia:  No  Handed:  Right  AIMS (if indicated):     Assets:  Desire for Improvement Resilience  ADL's:  Impaired  Cognition:  Impaired,  Mild  Sleep:       Treatment Plan Summary: Daily contact with patient to assess and evaluate symptoms and progress in treatment, Medication management and Plan Continue 15-minute checks.  Restart antidepressant medicine.  Continue vitamins.  Engage in individual and group therapy.  Discussed outpatient options versus rehab with the patient.  Observation Level/Precautions:  15 minute checks  Laboratory:  Chemistry Profile  Psychotherapy:    Medications:    Consultations:    Discharge Concerns:    Estimated LOS:  Other:     Physician Treatment Plan for Primary Diagnosis: MDD (major depressive disorder), recurrent episode, severe ([facility]) Long Term Goal(s):  Improvement in symptoms so as ready for discharge  Short Term Goals: Ability to disclose and discuss suicidal ideas and Ability to demonstrate self-control will improve  Physician Treatment Plan for Secondary Diagnosis: Principal Problem:   MDD (major depressive disorder), recurrent episode, severe (H[facility]) Active Problems:   Stimulant use disorder (HCC) (cocaine)   Alcohol use disorder, moderate, dependence (HCC)   Pregnant   Medically noncompliant  Long Term Goal(s): Improvement in symptoms so as ready for discharge  Short Term Goals: Ability to maintain clinical measurements within normal limits will improve and Compliance with prescribed medications will improve  I certify that inpatient services furnished can reasonably be expected to improve the patient's condition.    [employer], MD 4/15/20214:17 PM

## 2020-04-08 NOTE — ED Notes (Signed)
Assumed care of patient. Patient sleeping, as per prior nurse slept through the night without difficulties. Patient awakens to name but did not want to talk this morning. Vital are stable awaiting further plan of care.

## 2020-04-08 NOTE — Progress Notes (Signed)
Admission Note:  The patient arrived to the unit accompanied by security.  Her belongings were searched, documents were signed, and a skin search was conducted.  The patient presents with suicidal ideation and a feeling of being overwhelmed.  [name] was cooperative and pleasant, verbally agreeing not to harm herself while on the unit.  15-minute safety checks initiated.

## 2020-04-09 ENCOUNTER — Encounter: Payer: Self-pay | Admitting: Behavioral Health

## 2020-04-09 MED ORDER — HYDROXYZINE HCL 50 MG PO TABS
50.0000 mg | ORAL_TABLET | Freq: Four times a day (QID) | ORAL | Status: DC | PRN
  Administered 2020-04-09 – 2020-04-11 (×6): 50 mg via ORAL
  Filled 2020-04-09 (×6): qty 1

## 2020-04-09 NOTE — [facility] Group Notes (Signed)
CSW attempted to complete PSA, but was asleep and would not wake. Per nursing staff, pt had an outburst yesterday and had to be medicated. PSA will be attempted at later time.   Iris Pert, MSW, LCSW Clinical Social Work 04/09/2020 9:23 AM

## 2020-04-09 NOTE — Progress Notes (Signed)
[facility] MD Progress Note  04/09/2020 1:09 PM Arien Morine Fielding  MRN:  [id number] Subjective: Follow-up for this 34 year old woman with a history of depression and substance abuse.  Came into the hospital once again voicing suicidal ideation and showing labile mood in the context of relapse into substance use.  Yesterday when I spoke with her she was withdrawn and tired in bed and was polite in her interactions but yesterday evening she lost her temper and poor property because she did not like the food she had been served.  Today I found her again lying in bed initially calm although later on somewhat irritable.  Continues to voice passive suicidal ideation without intent.  Continues to feel hopeless.  Continues to minimize substance abuse problems.  No medical complaints other than being hungry and wanting more food.  Vital signs seems stable.  No complaints related to the pregnancy. Principal Problem: MDD (major depressive disorder), recurrent episode, severe (HCC) Diagnosis: Principal Problem:   MDD (major depressive disorder), recurrent episode, severe (HCC) Active Problems:   Stimulant use disorder (HCC) (cocaine)   Alcohol use disorder, moderate, dependence (HCC)   Pregnant   Medically noncompliant  Total Time spent with patient: 30 minutes  Past Psychiatric History: Past history of recurrent episodes of similar behavior with substance abuse  Past Medical History:  Past Medical History:  Diagnosis Date  . Anxiety   . Depression   . History of substance abuse (HCC)   . History of suicide attempt   . History of thyroid disease   . Hypertension   . Thyroid disease     Past Surgical History:  Procedure Laterality Date  . CESAREAN SECTION  2000, 2002, 2013   Family History:  Family History  Problem Relation Age of Onset  . Diabetes Mother    Family Psychiatric  History: See previous Social History:  Social History   Substance and Sexual Activity  Alcohol Use Not Currently   Comment: social drinker     Social History   Substance and Sexual Activity  Drug Use Not Currently  . Types: Cocaine, Marijuana   Comment: Positive UDS 12/2017    Social History   Socioeconomic History  . Marital status: Single    Spouse name: Not on file  . Number of children: Not on file  . Years of education: Not on file  . Highest education level: Not on file  Occupational History  . Not on file  Tobacco Use  . Smoking status: Current Every Day Smoker    Packs/day: 0.50  . Smokeless tobacco: Never Used  Substance and Sexual Activity  . Alcohol use: Not Currently    Comment: social drinker  . Drug use: Not Currently    Types: Cocaine, Marijuana    Comment: Positive UDS 12/2017  . Sexual activity: Not Currently    Birth control/protection: None  Other Topics Concern  . Not on file  Social History Narrative  . Not on file   Social Determinants of Health   Financial Resource Strain:   . Difficulty of Paying Living Expenses:   Food Insecurity:   . Worried About Programme researcher, broadcasting/film/video in the Last Year:   . Barista in the Last Year:   Transportation Needs:   . Freight forwarder (Medical):   Marland Kitchen Lack of Transportation (Non-Medical):   Physical Activity:   . Days of Exercise per Week:   . Minutes of Exercise per Session:   Stress:   . Feeling  of Stress :   Social Connections:   . Frequency of Communication with Friends and Family:   . Frequency of Social Gatherings with Friends and Family:   . Attends Religious Services:   . Active Member of Clubs or Organizations:   . Attends Club or Organization Meetings:   Marland Kitchen. Marital Status:    Additional Social History:                         Sleep: Fair  Appetite:  Fair  Current Medications: Current Facility-Administered Medications  MedicatiBankeron Dose Route Frequency Provider Last Rate Last Admin  . acetaminophen (TYLENOL) tablet 650 mg  650 mg Oral Q6H PRN Gillermo Murdochhompson, Jacqueline, NP   650 mg at  04/09/20 0814  . alum & mag hydroxide-simeth (MAALOX/MYLANTA) 200-200-20 MG/5ML suspension 30 mL  30 mL Oral Q4H PRN Gillermo Murdochhompson, Jacqueline, NP      . diphenhydrAMINE (BENADRYL) injection 50 mg  50 mg Intramuscular Q6H PRN Shaunita Seney, Jackquline DenmarkJohn T, MD   50 mg at 04/08/20 1752  . feeding supplement (ENSURE ENLIVE) (ENSURE ENLIVE) liquid 237 mL  237 mL Oral BID BM Ema Hebner T, MD      . FLUoxetine (PROZAC) capsule 20 mg  20 mg Oral Daily Zulema Pulaski T, MD      . haloperidol lactate (HALDOL) injection 5 mg  5 mg Intramuscular Q6H PRN Nastashia Gallo, Jackquline DenmarkJohn T, MD   5 mg at 04/08/20 1752  . magnesium hydroxide (MILK OF MAGNESIA) suspension 30 mL  30 mL Oral Daily PRN Gillermo Murdochhompson, Jacqueline, NP      . prenatal multivitamin tablet 1 tablet  1 tablet Oral Q1200 Gillermo Murdochhompson, Jacqueline, NP   1 tablet at 04/09/20 1157  . pyridOXINE (VITAMIN B-6) tablet 25 mg  25 mg Oral Daily Gillermo Murdochhompson, Jacqueline, NP   25 mg at 04/09/20 [id number]    Lab Results:  Results for orders placed or performed during the hospital encounter of 04/07/20 (from the past 48 hour(s))  hCG, quantitative, pregnancy     Status: Abnormal   Collection Time: 04/07/20 11:54 PM  Result Value Ref Range   hCG, Beta Chain, Quant, S 7,623 (H) <5 mIU/mL    Comment:          GEST. AGE      CONC.  (mIU/mL)   <=1 WEEK        5 - 50     2 WEEKS       50 - 500     3 WEEKS       100 - 10,000     4 WEEKS     1,000 - 30,000     5 WEEKS     3,500 - 115,000   6-8 WEEKS     12,000 - 270,000    12 WEEKS     15,000 - 220,000        FEMALE AND NON-PREGNANT FEMALE:     LESS THAN 5 mIU/mL Performed at [facility], [address] [place], [place] [address]   CBC with Differential     Status: Abnormal   Collection Time: 04/07/20 11:54 PM  Result Value Ref Range   WBC 12.9 (H) 4.0 - 10.5 K/uL   RBC 3.71 (L) 3.87 - 5.11 MIL/uL   Hemoglobin 11.0 (L) 12.0 - 15.0 g/dL   HCT 78.232.6 (L) 95.636.0 - 21.346.0 %   MCV 87.9 80.0 - 100.0 fL   MCH 29.6 26.0 - 34.0 pg   MCHC  33.7 30.0 -  36.0 g/dL   RDW 38.7 56.4 - 33.2 %   Platelets 242 150 - 400 K/uL   nRBC 0.0 0.0 - 0.2 %   Neutrophils Relative % 55 %   Neutro Abs 7.1 1.7 - 7.7 K/uL   Lymphocytes Relative 27 %   Lymphs Abs 3.5 0.7 - 4.0 K/uL   Monocytes Relative 5 %   Monocytes Absolute 0.7 0.1 - 1.0 K/uL   Eosinophils Relative 12 %   Eosinophils Absolute 1.6 (H) 0.0 - 0.5 K/uL   Basophils Relative 0 %   Basophils Absolute 0.1 0.0 - 0.1 K/uL   Immature Granulocytes 1 %   Abs Immature Granulocytes 0.11 (H) 0.00 - 0.07 K/uL    Comment: Performed at [facility], [address] [place], [place] [address]  Comprehensive metabolic panel     Status: Abnormal   Collection Time: 04/07/20 11:54 PM  Result Value Ref Range   Sodium 136 135 - 145 mmol/L   Potassium 3.3 (L) 3.5 - 5.1 mmol/L   Chloride 107 98 - 111 mmol/L   CO2 19 (L) 22 - 32 mmol/L   Glucose, Bld 109 (H) 70 - 99 mg/dL    Comment: Glucose reference range applies only to samples taken after fasting for at least 8 hours.   BUN 10 6 - 20 mg/dL   Creatinine, Ser 4.16 0.44 - 1.00 mg/dL   Calcium 8.4 (L) 8.9 - 10.3 mg/dL   Total Protein 7.4 6.5 - 8.1 g/dL   Albumin 3.8 3.5 - 5.0 g/dL   AST 11 (L) 15 - 41 U/L   ALT 12 0 - 44 U/L   Alkaline Phosphatase 56 38 - 126 U/L   Total Bilirubin 0.6 0.3 - 1.2 mg/dL   GFR calc non Af Amer >60 >60 mL/min   GFR calc Af Amer >60 >60 mL/min   Anion gap 10 5 - 15    Comment: Performed at [facility], [address], [place], [place] [address]  Ethanol     Status: Abnormal   Collection Time: 04/07/20 11:54 PM  Result Value Ref Range   Alcohol, Ethyl (B) 26 (H) <10 mg/dL    Comment: (NOTE) Lowest detectable limit for serum alcohol is 10 mg/dL. For medical purposes only. Performed at [facility], [address] [place], [place] [address]   Acetaminophen level     Status: Abnormal   Collection Time: 04/07/20 11:54 PM  Result Value Ref Range   Acetaminophen (Tylenol), Serum <10 (L) 10 -  30 ug/mL    Comment: (NOTE) Therapeutic concentrations vary significantly. A range of 10-30 ug/mL  may be an effective concentration for many patients. However, some  are best treated at concentrations outside of this range. Acetaminophen concentrations >150 ug/mL at 4 hours after ingestion  and >50 ug/mL at 12 hours after ingestion are often associated with  toxic reactions. Performed at [facility], [address] [place], [place] [address]   Salicylate level     Status: Abnormal   Collection Time: 04/07/20 11:54 PM  Result Value Ref Range   Salicylate Lvl <7.0 (L) 7.0 - 30.0 mg/dL    Comment: Performed at [facility], [address] [place], [place] [address]  Urinalysis, Complete w Microscopic     Status: Abnormal   Collection Time: 04/08/20 12:42 AM  Result Value Ref Range   Color, Urine YELLOW (A) YELLOW   APPearance HAZY (A) CLEAR   Specific Gravity, Urine  1.031 (H) 1.005 - 1.030   pH 5.0 5.0 - 8.0   Glucose, UA NEGATIVE NEGATIVE mg/dL   Hgb urine dipstick NEGATIVE NEGATIVE   Bilirubin Urine NEGATIVE NEGATIVE   Ketones, ur NEGATIVE NEGATIVE mg/dL   Protein, ur NEGATIVE NEGATIVE mg/dL   Nitrite NEGATIVE NEGATIVE   Leukocytes,Ua TRACE (A) NEGATIVE   WBC, UA 0-5 0 - 5 WBC/hpf   Bacteria, UA NONE SEEN NONE SEEN   Squamous Epithelial / LPF 11-20 0 - 5   Mucus PRESENT     Comment: Performed at [facility], [address] [place], [place] [address]  Urine Drug Screen, Qualitative ([facility] only)     Status: Abnormal   Collection Time: 04/08/20 12:42 AM  Result Value Ref Range   Tricyclic, Ur Screen NONE DETECTED NONE DETECTED   Amphetamines, Ur Screen NONE DETECTED NONE DETECTED   MDMA (Ecstasy)Ur Screen NONE DETECTED NONE DETECTED   Cocaine Metabolite,Ur Sour  POSITIVE (A) NONE DETECTED   Opiate, Ur Screen NONE DETECTED NONE DETECTED   Phencyclidine (PCP) Ur S NONE DETECTED NONE DETECTED   Cannabinoid 50 Ng, Ur [place] POSITIVE (A) NONE DETECTED    Barbiturates, Ur Screen NONE DETECTED NONE DETECTED   Benzodiazepine, Ur Scrn NONE DETECTED NONE DETECTED   Methadone Scn, Ur NONE DETECTED NONE DETECTED    Comment: (NOTE) Tricyclics + metabolites, urine    Cutoff 1000 ng/mL Amphetamines + metabolites, urine  Cutoff 1000 ng/mL MDMA (Ecstasy), urine              Cutoff 500 ng/mL Cocaine Metabolite, urine          Cutoff 300 ng/mL Opiate + metabolites, urine        Cutoff 300 ng/mL Phencyclidine (PCP), urine         Cutoff 25 ng/mL Cannabinoid, urine                 Cutoff 50 ng/mL Barbiturates + metabolites, urine  Cutoff 200 ng/mL Benzodiazepine, urine              Cutoff 200 ng/mL Methadone, urine                   Cutoff 300 ng/mL The urine drug screen provides only a preliminary, unconfirmed analytical test result and should not be used for non-medical purposes. Clinical consideration and professional judgment should be applied to any positive drug screen result due to possible interfering substances. A more specific alternate chemical method must be used in order to obtain a confirmed analytical result. Gas chromatography / mass spectrometry (GC/MS) is the preferred confirmat ory method. Performed at [facility], [address] [place], [place] [address]   Respiratory Panel by RT PCR (Flu A&B, Covid) - Nasopharyngeal Swab     Status: None   Collection Time: 04/08/20 12:42 AM   Specimen: Nasopharyngeal Swab  Result Value Ref Range   SARS Coronavirus 2 by RT PCR NEGATIVE NEGATIVE    Comment: (NOTE) SARS-CoV-2 target nucleic acids are NOT DETECTED. The SARS-CoV-2 RNA is generally detectable in upper respiratoy specimens during the acute phase of infection. The lowest concentration of SARS-CoV-2 viral copies this assay can detect is 131 copies/mL. A negative result does not preclude SARS-Cov-2 infection and should not be used as the sole basis for treatment or other patient management decisions. A negative result may  occur with  improper specimen collection/handling, submission of specimen other than nasopharyngeal swab, presence of viral mutation(s) within the areas targeted by this assay,  and inadequate number of viral copies (<131 copies/mL). A negative result must be combined with clinical observations, patient history, and epidemiological information. The expected result is Negative. Fact Sheet for Patients:  PinkCheek.be Fact Sheet for Healthcare Providers:  GravelBags.it This test is not yet ap proved or cleared by the [place] FDA and  has been authorized for detection and/or diagnosis of SARS-CoV-2 by FDA under an Emergency Use Authorization (EUA). This EUA will remain  in effect (meaning this test can be used) for the duration of the COVID-19 declaration under Section 564(b)(1) of the Act, 21 U.S.C. section 360bbb-3(b)(1), unless the authorization is terminated or revoked sooner.    Influenza A by PCR NEGATIVE NEGATIVE   Influenza B by PCR NEGATIVE NEGATIVE    Comment: (NOTE) The Xpert Xpress SARS-CoV-2/FLU/RSV assay is intended as an aid in  the diagnosis of influenza from Nasopharyngeal swab specimens and  should not be used as a sole basis for treatment. Nasal washings and  aspirates are unacceptable for Xpert Xpress SARS-CoV-2/FLU/RSV  testing. Fact Sheet for Patients: PinkCheek.be Fact Sheet for Healthcare Providers: GravelBags.it This test is not yet approved or cleared by the [place] FDA and  has been authorized for detection and/or diagnosis of SARS-CoV-2 by  FDA under an Emergency Use Authorization (EUA). This EUA will remain  in effect (meaning this test can be used) for the duration of the  Covid-19 declaration under Section 564(b)(1) of the Act, 21  U.S.C. section 360bbb-3(b)(1), unless the authorization is  terminated or revoked. Performed at  [facility], [address] [place], [place] [address]     Blood Alcohol level:  Lab Results  Component Value Date   ETH 26 (H) 04/07/2020   ETH <10 10/93/2355    Metabolic Disorder Labs: Lab Results  Component Value Date   HGBA1C 5.1 01/02/2018   MPG 99.67 01/02/2018   No results found for: PROLACTIN Lab Results  Component Value Date   CHOL 218 (H) 01/02/2018   TRIG 187 (H) 01/02/2018   HDL 81 01/02/2018   CHOLHDL 2.7 01/02/2018   VLDL 37 01/02/2018   LDLCALC 100 (H) 01/02/2018   LDLCALC 82 01/12/2016    Physical Findings: AIMS:  , ,  ,  ,    CIWA:    COWS:     Musculoskeletal: Strength & Muscle Tone: within normal limits Gait & Station: normal Patient leans: N/A  Psychiatric Specialty Exam: Physical Exam  Nursing note and vitals reviewed. Constitutional: She appears well-developed and well-nourished.  HENT:  Head: Normocephalic and atraumatic.  Eyes: Pupils are equal, round, and reactive to light. Conjunctivae are normal.  Cardiovascular: Regular rhythm and normal heart sounds.  Respiratory: Effort normal. No respiratory distress.  GI: Soft.  Musculoskeletal:        General: Normal range of motion.     Cervical back: Normal range of motion.  Neurological: She is alert.  Skin: Skin is warm and dry.  Psychiatric: Her mood appears anxious. Her affect is labile. Her speech is delayed. She is agitated. She is not aggressive. Thought content is not paranoid and not delusional. Cognition and memory are impaired. She expresses impulsivity.    Review of Systems  Constitutional: Negative.   HENT: Negative.   Eyes: Negative.   Respiratory: Negative.   Cardiovascular: Negative.   Gastrointestinal: Negative.   Musculoskeletal: Negative.   Skin: Negative.   Neurological: Negative.   Psychiatric/Behavioral: Positive for agitation and dysphoric mood. Negative for behavioral problems and suicidal ideas. The  patient is nervous/anxious.     Blood  pressure 138/86, pulse 84, temperature 98.5 F (36.9 C), temperature source Oral, resp. rate 18, height  (1.499 m), weight 75 kg, last menstrual period 12/04/2019, SpO2 100 %.Body mass index is 33.4 kg/m.  General Appearance: Casual  Eye Contact:  Fair  Speech:  Normal Rate  Volume:  Normal  Mood:  Irritable  Affect:  Congruent  Thought Process:  Coherent  Orientation:  Full (Time, Place, and Person)  Thought Content:  Logical  Suicidal Thoughts:  Yes.  without intent/plan  Homicidal Thoughts:  No  Memory:  Immediate;   Fair Recent;   Poor Remote;   Fair  Judgement:  Impaired  Insight:  Shallow  Psychomotor Activity:  Restlessness  Concentration:  Concentration: Fair  Recall:  [employer] of Knowledge:  Fair  Language:  Fair  Akathisia:  No  Handed:  Right  AIMS (if indicated):     Assets:  Desire for Improvement Physical Health  ADL's:  Impaired  Cognition:  Impaired,  Mild  Sleep:  Number of Hours: 8.25     Treatment Plan Summary: Daily contact with patient to assess and evaluate symptoms and progress in treatment, Medication management and Plan Continue current modest doses of antidepressant.  Continue vitamins.  I do not think she is likely to need antipsychotic medicine or mood stabilizers.  Try to get her included in individual and group therapy.  I have repeated an order for double portions.  Patient can be engaged about whether substance abuse treatment such as inpatient treatment might be something she would be agreeable to.  Mordecai Rasmussen, MD 04/09/2020, 1:09 PM

## 2020-04-09 NOTE — Plan of Care (Signed)
D- Patient alert and oriented. Patient presented in a slightly irritable, but pleasant mood on assessment reporting that she didn't sleep well because of the events that transpired with her last evening with the day staff. Patient reported a pain level of "8/10" in her right shoulder, where she received injections yesterday, in which she did request pain medication from this Clinical research associate. Patient denied SI, HI, AVH at this time.  Patient also denied any signs/symptoms of depression/anxiety. Patient had no stated goals for today.  A- Some scheduled medications administered to patient, per MD orders. Support and encouragement provided.  Routine safety checks conducted every 15 minutes.  Patient informed to notify staff with problems or concerns.  R- No adverse drug reactions noted. Patient contracts for safety at this time. Patient compliant with medications and treatment plan. Patient receptive, calm, and cooperative. Patient interacts well with others on the unit.  Patient remains safe at this time.  Problem: Education: Goal: Knowledge of [facility] Education information/materials will improve Outcome: Progressing Goal: Emotional status will improve Outcome: Progressing Goal: Mental status will improve Outcome: Progressing Goal: Verbalization of understanding the information provided will improve Outcome: Progressing   Problem: Activity: Goal: Interest or engagement in activities will improve Outcome: Progressing Goal: Sleeping patterns will improve Outcome: Progressing   Problem: Coping: Goal: Ability to verbalize frustrations and anger appropriately will improve Outcome: Progressing Goal: Ability to demonstrate self-control will improve Outcome: Progressing   Problem: Health Behavior/Discharge Planning: Goal: Identification of resources available to assist in meeting health care needs will improve Outcome: Progressing Goal: Compliance with treatment plan for underlying cause of  condition will improve Outcome: Progressing   Problem: Physical Regulation: Goal: Ability to maintain clinical measurements within normal limits will improve Outcome: Progressing   Problem: Safety: Goal: Periods of time without injury will increase Outcome: Progressing   Problem: Education: Goal: Utilization of techniques to improve thought processes will improve Outcome: Progressing Goal: Knowledge of the prescribed therapeutic regimen will improve Outcome: Progressing   Problem: Activity: Goal: Interest or engagement in leisure activities will improve Outcome: Progressing Goal: Imbalance in normal sleep/wake cycle will improve Outcome: Progressing   Problem: Coping: Goal: Coping ability will improve Outcome: Progressing Goal: Will verbalize feelings Outcome: Progressing   Problem: Health Behavior/Discharge Planning: Goal: Ability to make decisions will improve Outcome: Progressing Goal: Compliance with therapeutic regimen will improve Outcome: Progressing   Problem: Role Relationship: Goal: Will demonstrate positive changes in social behaviors and relationships Outcome: Progressing   Problem: Safety: Goal: Ability to disclose and discuss suicidal ideas will improve Outcome: Progressing Goal: Ability to identify and utilize support systems that promote safety will improve Outcome: Progressing   Problem: Self-Concept: Goal: Will verbalize positive feelings about self Outcome: Progressing Goal: Level of anxiety will decrease Outcome: Progressing   Problem: Education: Goal: Ability to make informed decisions regarding treatment will improve Outcome: Progressing   Problem: Coping: Goal: Coping ability will improve Outcome: Progressing   Problem: Health Behavior/Discharge Planning: Goal: Identification of resources available to assist in meeting health care needs will improve Outcome: Progressing   Problem: Medication: Goal: Compliance with prescribed  medication regimen will improve Outcome: Progressing   Problem: Self-Concept: Goal: Ability to disclose and discuss suicidal ideas will improve Outcome: Progressing Goal: Will verbalize positive feelings about self Outcome: Progressing

## 2020-04-09 NOTE — Progress Notes (Signed)
Patient has calmed down a lot since hearing about losing custody of her children. Patient was just observed in the dayroom, with other members on the unit, talking and playing cards. Patient gave this writer a thumb's up when asked if she was doing ok.

## 2020-04-09 NOTE — Progress Notes (Signed)
Patient refused her Prozac, but could not give this Clinical research associate a reason as to why she didn't want it. MD will be notified.

## 2020-04-09 NOTE — [facility] Group Notes (Signed)
[facility] Group Notes:  (Nursing/MHT/Case Management/Adjunct)  Date:  04/09/2020  Time:  9:32 AM  Type of Therapy:  Community Meeting  Participation Level:  Did Not Attend    Lynelle Smoke [facility] 04/09/2020, 9:32 AM

## 2020-04-09 NOTE — Progress Notes (Signed)
Patient was just notified that CPS has taken over custody of her children. Patient was extremely tearful after the conversation. Patient asked this writer if there was anything that she could take for her nerves, in which this writer was able to speak to the MD and obtained medication for her.

## 2020-04-09 NOTE — Progress Notes (Signed)
Recreation Therapy Notes  INPATIENT RECREATION THERAPY ASSESSMENT  Patient Details Name: [name] MRN: [id number] DOB: 1986/05/29 Today's Date: 04/09/2020       Information Obtained From: Patient  Able to Participate in Assessment/Interview: Yes  Patient Presentation: Responsive  Reason for Admission (Per Patient): Active Symptoms, Other (Comments)(Depression)  Patient Stressors:    Coping Skills:   Music, Other (Comment)(Walk to get fresh air)  Leisure Interests (2+):  Individual - TV  Frequency of Recreation/Participation:    Awareness of Community Resources:     [employer]:     Current Use:    If no, Barriers?:    Expressed Interest in [employer] Information:    [place] of Residence:  Film/video editor  Patient Main Form of Transportation: (Friends)  Patient Strengths:  My smile and cooking  Patient Identified Areas of Improvement:  My depression and anger  Patient Goal for Hospitalization:  Stay calm  Current SI (including self-harm):  No  Current HI:  No  Current AVH: No  Staff Intervention Plan: Group Attendance, Collaborate with Interdisciplinary Treatment Team  Consent to Intern Participation: N/A  Earon Rivest 04/09/2020, 3:04 PM

## 2020-04-09 NOTE — [facility] Group Notes (Signed)
[facility] Group Notes:  (Nursing/MHT/Case Management/Adjunct)  Date:  04/09/2020  Time:  8:58 PM  Type of Therapy:  Group Therapy  Participation Level:  Active  Participation Quality:  Appropriate  Affect:  Appropriate and and sad about her kids.  Cognitive:  Alert  Insight:  Good  Engagement in Group:  Engaged  Modes of Intervention:  Support  Summary of Progress/Problems:  [name] 04/09/2020, 8:58 PM

## 2020-04-09 NOTE — Progress Notes (Signed)
Recreation Therapy Notes    Date: 04/09/2020  Time: 9:30 am   Location: Craft room   Behavioral response: N/A   Intervention Topic: Coping-Skills   Discussion/Intervention: Patient did not attend group.   Clinical Observations/Feedback:  Patient did not attend group.   Sava Proby LRT/CTRS        Bianna Haran 04/09/2020 11:39 AM

## 2020-04-10 DIAGNOSIS — Z3A18 18 weeks gestation of pregnancy: Secondary | ICD-10-CM

## 2020-04-10 DIAGNOSIS — Z9119 Patient's noncompliance with other medical treatment and regimen: Secondary | ICD-10-CM

## 2020-04-10 DIAGNOSIS — F1020 Alcohol dependence, uncomplicated: Secondary | ICD-10-CM | POA: Diagnosis not present

## 2020-04-10 DIAGNOSIS — F332 Major depressive disorder, recurrent severe without psychotic features: Secondary | ICD-10-CM | POA: Diagnosis not present

## 2020-04-10 DIAGNOSIS — F1590 Other stimulant use, unspecified, uncomplicated: Secondary | ICD-10-CM

## 2020-04-10 NOTE — Tx Team (Addendum)
Interdisciplinary Treatment and Diagnostic Plan Update  04/10/2020 Time of Session: Cadwell Peterkin MRN: [id number]  Principal Diagnosis: MDD (major depressive disorder), recurrent episode, severe ([place])  Secondary Diagnoses: Principal Problem:   MDD (major depressive disorder), recurrent episode, severe ([place]) Active Problems:   Stimulant use disorder ([place]) (cocaine)   Alcohol use disorder, moderate, dependence ([place])   Pregnant   Medically noncompliant   Current Medications:  Current Facility-Administered Medications  Medication Dose Route Frequency Provider Last Rate Last Admin  . acetaminophen (TYLENOL) tablet 650 mg  650 mg Oral Q6H PRN Caroline Sauger, NP   650 mg at 04/09/20 2134  . alum & mag hydroxide-simeth (MAALOX/MYLANTA) 200-200-20 MG/5ML suspension 30 mL  30 mL Oral Q4H PRN Caroline Sauger, NP      . diphenhydrAMINE (BENADRYL) injection 50 mg  50 mg Intramuscular Q6H PRN Clapacs, Madie Reno, MD   50 mg at 04/08/20 1752  . feeding supplement (ENSURE ENLIVE) (ENSURE ENLIVE) liquid 237 mL  237 mL Oral BID BM Clapacs, John T, MD      . FLUoxetine (PROZAC) capsule 20 mg  20 mg Oral Daily Clapacs, John T, MD   20 mg at 04/10/20 0758  . haloperidol lactate (HALDOL) injection 5 mg  5 mg Intramuscular Q6H PRN Clapacs, Madie Reno, MD   5 mg at 04/08/20 1752  . hydrOXYzine (ATARAX/VISTARIL) tablet 50 mg  50 mg Oral Q6H PRN Clapacs, Madie Reno, MD   50 mg at 04/10/20 0758  . magnesium hydroxide (MILK OF MAGNESIA) suspension 30 mL  30 mL Oral Daily PRN Caroline Sauger, NP      . prenatal multivitamin tablet 1 tablet  1 tablet Oral Q1200 Caroline Sauger, NP   1 tablet at 04/09/20 1157  . pyridOXINE (VITAMIN B-6) tablet 25 mg  25 mg Oral Daily Caroline Sauger, NP   25 mg at 04/10/20 3086   PTA Medications: Medications Prior to Admission  Medication Sig Dispense Refill Last Dose  . FLUoxetine (PROZAC) 20 MG capsule Take 1 capsule (20 mg total) by mouth daily. (Patient  not taking: Reported on 04/08/2020) 30 capsule 1   . Prenatal Vit-Fe Fumarate-FA (PRENATAL MULTIVITAMIN) TABS tablet Take 1 tablet by mouth daily at 12 noon. 30 tablet 1   . pyridOXINE (VITAMIN B-6) 25 MG tablet Take 1 tablet (25 mg total) by mouth daily. 30 tablet 1     Patient Stressors:    Patient Strengths:    Treatment Modalities: Medication Management, Group therapy, Case management,  1 to 1 session with clinician, Psychoeducation, Recreational therapy.   Physician Treatment Plan for Primary Diagnosis: MDD (major depressive disorder), recurrent episode, severe ([place]) Long Term Goal(s): Improvement in symptoms so as ready for discharge Improvement in symptoms so as ready for discharge   Short Term Goals: Ability to disclose and discuss suicidal ideas Ability to demonstrate self-control will improve Ability to maintain clinical measurements within normal limits will improve Compliance with prescribed medications will improve  Medication Management: Evaluate patient's response, side effects, and tolerance of medication regimen.  Therapeutic Interventions: 1 to 1 sessions, Unit Group sessions and Medication administration.  Evaluation of Outcomes: Not Met  Physician Treatment Plan for Secondary Diagnosis: Principal Problem:   MDD (major depressive disorder), recurrent episode, severe ([place]) Active Problems:   Stimulant use disorder ([facility]) (cocaine)   Alcohol use disorder, moderate, dependence ([place])   Pregnant   Medically noncompliant  Long Term Goal(s): Improvement in symptoms so as ready for discharge Improvement in symptoms so as ready  for discharge   Short Term Goals: Ability to disclose and discuss suicidal ideas Ability to demonstrate self-control will improve Ability to maintain clinical measurements within normal limits will improve Compliance with prescribed medications will improve     Medication Management: Evaluate patient's response, side effects, and tolerance  of medication regimen.  Therapeutic Interventions: 1 to 1 sessions, Unit Group sessions and Medication administration.  Evaluation of Outcomes: Not Met   RN Treatment Plan for Primary Diagnosis: MDD (major depressive disorder), recurrent episode, severe ([place]) Long Term Goal(s): Knowledge of disease and therapeutic regimen to maintain health will improve  Short Term Goals: Ability to identify and develop effective coping behaviors will improve and Compliance with prescribed medications will improve  Medication Management: RN will administer medications as ordered by provider, will assess and evaluate patient's response and provide education to patient for prescribed medication. RN will report any adverse and/or side effects to prescribing provider.  Therapeutic Interventions: 1 on 1 counseling sessions, Psychoeducation, Medication administration, Evaluate responses to treatment, Monitor vital signs and CBGs as ordered, Perform/monitor CIWA, COWS, AIMS and Fall Risk screenings as ordered, Perform wound care treatments as ordered.  Evaluation of Outcomes: Not Met   LCSW Treatment Plan for Primary Diagnosis: MDD (major depressive disorder), recurrent episode, severe ([place]) Long Term Goal(s): Safe transition to appropriate next level of care at discharge, Engage patient in therapeutic group addressing interpersonal concerns.  Short Term Goals: Engage patient in aftercare planning with referrals and resources, Increase social support and Increase skills for wellness and recovery  Therapeutic Interventions: Assess for all discharge needs, 1 to 1 time with Social worker, Explore available resources and support systems, Assess for adequacy in community support network, Educate family and significant other(s) on suicide prevention, Complete Psychosocial Assessment, Interpersonal group therapy.  Evaluation of Outcomes: Not Met   Progress in Treatment: Attending groups: No. Participating in groups:  No. Taking medication as prescribed: Yes. Toleration medication: Yes. Family/Significant other contact made: No, will contact:  when given permission Patient understands diagnosis: Yes. Discussing patient identified problems/goals with staff: Yes. Medical problems stabilized or resolved: Yes. Denies suicidal/homicidal ideation: Yes. Issues/concerns per patient self-inventory: No. Other: none  New problem(s) identified: No, Describe:  none  New Short Term/Long Term Goal(s):  Patient Goals:  Get better  Discharge Plan or Barriers:   Reason for Continuation of Hospitalization: Depression Medication stabilization  Estimated Length of Stay:3-5 days.  Attendees: Patient:[name] 04/10/2020   Physician: Dr Mallie Darting, MD 04/10/2020   Nursing:  04/10/2020   RN Care Manager: 04/10/2020   Social Worker: Lurline Idol, LCSW 04/10/2020   Recreational Therapist:  04/10/2020   Other:  04/10/2020   Other:  04/10/2020   Other: 04/10/2020      Scribe for Treatment Team: Joanne Chars, [place] 04/10/2020 9:42 AM

## 2020-04-10 NOTE — Progress Notes (Signed)
F - Stabilize mood.  D - The patient was calm and cooperative.  She denied significant issues and was pleasant upon contact.  [name] ate adequate meals and had no related complaints.    A - No unsafe behaviors observed.  Mood was stable and euthymic.  [name] CONTINUES TO REFUSE PROZAC.  R - Continue with care.

## 2020-04-10 NOTE — BHH Counselor (Signed)
Adult Comprehensive Assessment  Patient ID: [name], female   DOB: [birth year], 34 y.o.   MRN: [id number]  Information Source: Information source: Patient  Current Stressors:  Patient states their primary concerns and needs for treatment are:: Pt wants help with anxiety and depression. Patient states their goals for this hospitilization and ongoing recovery are:: "get better" Family Relationships: Pt reports DSS has "taken my kids away". Single parenting, stressful. Housing / Lack of housing: Pt reports her apartment is "a Product manager (include injuries & life threatening diseases): Pt reports she is pregnant  Living/Environment/Situation:  Living Arrangements: Children, Parent Living conditions (as described by patient or guardian): "It's a wreck", needs cleaning Who else lives in the home?: self, 2 children, pt mother How long has patient lived in current situation?: 4 years What is atmosphere in current home: Chaotic  Family History:  Marital status: Single Are you sexually active?: No What is your sexual orientation?: Heterosexual Does patient have children?: Yes How many children?: 2 How is patient's relationship with their children?: 7 uearold and 13 year old.  Childhood History:  By whom was/is the patient raised?: Mother, Grandparents Additional childhood history information: Pt was born and raised in [place], [place].  Her parents were married, but separated when she was approximately 10-5 yo.  Description of patient's relationship with caregiver when they were a child: Pt was primarily raised by her mother and her mother's parents (she and her mother lived in their home).  She shared that she was very close to her grandparents.   Patient's description of current relationship with people who raised him/her: Conflict with mother, limited contact with father. How were you disciplined when you got in trouble as a child/adolescent?: Pt shared that she received  occasional spanking from her mother Does patient have siblings?: Yes Number of Siblings: 1 Description of patient's current relationship with siblings: Pt has one older sister.  She shared that she doesn't talk to her sister. Did patient suffer any verbal/emotional/physical/sexual abuse as a child?: No Did patient suffer from severe childhood neglect?: No Has patient ever been sexually abused/assaulted/raped as an adolescent or adult?: No Was the patient ever a victim of a crime or a disaster?: No Witnessed domestic violence?: No Has patient been effected by domestic violence as an adult?: No  Education:  Highest grade of school patient has completed: 10th grade Currently a student?: No Learning disability?: No  Employment/Work Situation:   Employment situation: Unemployed Patient's job has been impacted by current illness: (na) What is the longest time patient has a held a job?: 2 years  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  marijuana: every other day. If attempted suicide, did drugs/alcohol play a role in this?: No Alcohol/Substance Abuse Treatment Hx: Denies past history Has alcohol/substance abuse ever caused legal problems?: Yes(possession of cocaine "years ago")  Social Support System:   Heritage manager System: None Describe Community Support System: Lives with mother. Type of faith/religion: no How does  patient's faith help to cope with  current illness?: na  Leisure/Recreation:   Leisure and Hobbies: "nothing"  Strengths/Needs:   What is the patient's perception of their strengths?: cooking, being a mother Patient states they can use these personal strengths during their treatment to contribute to their recovery: when things are better with her kids, she is less depressed Patient states these barriers may affect/interfere with their treatment: none Patient states these barriers may affect their return to the community: none Other important information patient would like considered in planning for their treatment: no  Discharge Plan:   Currently receiving community mental health services: Yes (From Whom)([facility]-peer support. Wants a new provider.) Patient states concerns and preferences for aftercare planning are: Pt would like to switch to [facility]. Patient states they will know when they are safe and ready for discharge when: "I dont' know" Does patient have access to transportation?: No Does patient have financial barriers related to discharge medications?: No Patient description of barriers related to discharge medications: Pt has medicaid Plan for no access to transportation at discharge: CSW will assess for plan. Will patient be returning to same living situation after discharge?: Yes  Summary/Recommendations:   Summary and Recommendations (to be completed by the evaluator): Pt is 34 year old female from [place].  Pt is diagnosed with major depressive disorder and was admitted due in increased depression and thoughts of self harm.  Recommendations for pt include crisis stabilization, therapeutic milieu, attend and participate in groups, medication management, and development of comprehensive mental wellness plan.  Lorri Frederick. 04/10/2020

## 2020-04-10 NOTE — Plan of Care (Signed)
Patient was calm and cooperative.  Problem: Education: Goal: Emotional status will improve Outcome: Progressing Goal: Mental status will improve Outcome: Progressing

## 2020-04-10 NOTE — BHH Group Notes (Signed)
LCSW Relapse Prevention and Social Support Group Note   04/10/2020 1300  Type of Group and Topic: Psychoeducational Group:  Relapse prevention and social support.   Participation Level:  Did not attend.  Description of Group  Relapse prevention and developing social support group identifies mental health triggers and early warning signs as a first step towards developing appropriate coping skills.  This can include a relapse of mental health or substance use symptoms.  With the help of examples, patients are encouraged to recognize and intervene when symptoms return before a crisis occurs.  Patients are also engaged to evaluate their current support network and to consider ways to increase and broaden that network.    Therapeutic Goals 1. Patients will identify triggers and early symptoms related to both mental health and substance use relapses. 2. Patients will begin the process of identifying plans/coping skills to manage these symptoms before they escalate to a crisis. 3. Patients will consider individuals in their current support network, whether they are positive or negative supports, and look at options to increase the number of positive supports in their network.    Summary of Patient Progress     Therapeutic Modalities: Cognitive Behavioral Therapy Psychoeducation    Greg Adreanne Yono, MSW, LCSW  

## 2020-04-10 NOTE — Progress Notes (Signed)
[facility] MD Progress Note  04/10/2020 12:09 PM [name]  MRN:  [id number] Subjective: Patient is a 34 year old female with a past psychiatric history significant for depression and substance abuse who presented voluntarily to the emergency room at [facility] on 04/08/2020 with decreased mood and suicidal ideation.  Objective: Patient is seen and examined.  Patient is a 34 year old female with the above-stated past psychiatric history who is seen in follow-up.  She remains somewhat irritable.  She stated that he has lots of stress on her from at home.  She stated that DSS came and took her children away because her home was unclean.  She denied that there were any issues with her substance use that led to removal of the children.  Her drug screen had alcohol and cocaine in it, but she minimized both of those.  She was started on fluoxetine, hydroxyzine.  She stated she feels no different today.  She still has no idea what she is going to do about all the stressors including a new pregnancy..  Her vital signs are stable, she is afebrile.  She slept 6.5 hours last night.  Review of her admission laboratories revealed a mildly low potassium at 3.3, a slightly low calcium at 8.4, and essentially normal liver function enzymes.  Her CBC showed a mild elevation of her white blood cell count, and a mild anemia with a hemoglobin of 11 and hematocrit of 32.6.  Acetaminophen and salicylate were both negative.  Her beta-hCG was elevated at 7623.  Urinalysis showed no bacteria, and 0-5 white blood cells.  Her blood alcohol on admission was 26, and drug screen was positive for cocaine and marijuana.  An abdominal ultrasound showed the single live intrauterine pregnancy.  The estimated age was 18 weeks and 1 day.  She denied suicidal ideation.  She denied auditory and visual hallucinations.   Principal Problem: MDD (major depressive disorder), recurrent episode, severe ([facility]) Diagnosis: Principal  Problem:   MDD (major depressive disorder), recurrent episode, severe ([facility]) Active Problems:   Stimulant use disorder ([facility]) (cocaine)   Alcohol use disorder, moderate, dependence ([facility])   Pregnant   Medically noncompliant  Total Time spent with patient: 20 minutes  Past Psychiatric History: See admission H&P  Past Medical History:  Past Medical History:  Diagnosis Date  . Anxiety   . Depression   . History of substance abuse ([facility])   . History of suicide attempt   . History of thyroid disease   . Hypertension   . Thyroid disease     Past Surgical History:  Procedure Laterality Date  . CESAREAN SECTION  2000, 2002, 2013   Family History:  Family History  Problem Relation Age of Onset  . Diabetes Mother    Family Psychiatric  History: See admission H&P Social History:  Social History   Substance and Sexual Activity  Alcohol Use Not Currently   Comment: social drinker     Social History   Substance and Sexual Activity  Drug Use Not Currently  . Types: Cocaine, Marijuana   Comment: Positive UDS 12/2017    Social History   Socioeconomic History  . Marital status: Single    Spouse name: Not on file  . Number of children: Not on file  . Years of education: Not on file  . Highest education level: Not on file  Occupational History  . Not on file  Tobacco Use  . Smoking status: Current Every Day Smoker    Packs/day: 0.50  .  Smokeless tobacco: Never Used  Substance and Sexual Activity  . Alcohol use: Not Currently    Comment: social drinker  . Drug use: Not Currently    Types: Cocaine, Marijuana    Comment: Positive UDS 12/2017  . Sexual activity: Not Currently    Birth control/protection: None  Other Topics Concern  . Not on file  Social History Narrative  . Not on file   Social Determinants of Health   Financial Resource Strain:   . Difficulty of Paying Living Expenses:   Food Insecurity:   . Worried About Programme researcher, broadcasting/film/video in the Last Year:   .  Barista in the Last Year:   Transportation Needs:   . Freight forwarder (Medical):   Marland Kitchen Lack of Transportation (Non-Medical):   Physical Activity:   . Days of Exercise per Week:   . Minutes of Exercise per Session:   Stress:   . Feeling of Stress :   Social Connections:   . Frequency of Communication with Friends and Family:   . Frequency of Social Gatherings with Friends and Family:   . Attends Religious Services:   . Active Member of Clubs or Organizations:   . Attends Banker Meetings:   Marland Kitchen Marital Status:    Additional Social History:                         Sleep: Good  Appetite:  Good  Current Medications: Current Facility-Administered Medications  Medication Dose Route Frequency Provider Last Rate Last Admin  . acetaminophen (TYLENOL) tablet 650 mg  650 mg Oral Q6H PRN Gillermo Murdoch, NP   650 mg at 04/09/20 2134  . alum & mag hydroxide-simeth (MAALOX/MYLANTA) 200-200-20 MG/5ML suspension 30 mL  30 mL Oral Q4H PRN Gillermo Murdoch, NP      . diphenhydrAMINE (BENADRYL) injection 50 mg  50 mg Intramuscular Q6H PRN Clapacs, Jackquline Denmark, MD   50 mg at 04/08/20 1752  . feeding supplement (ENSURE ENLIVE) (ENSURE ENLIVE) liquid 237 mL  237 mL Oral BID BM Clapacs, John T, MD   237 mL at 04/10/20 0956  . FLUoxetine (PROZAC) capsule 20 mg  20 mg Oral Daily Clapacs, John T, MD   20 mg at 04/10/20 0758  . haloperidol lactate (HALDOL) injection 5 mg  5 mg Intramuscular Q6H PRN Clapacs, Jackquline Denmark, MD   5 mg at 04/08/20 1752  . hydrOXYzine (ATARAX/VISTARIL) tablet 50 mg  50 mg Oral Q6H PRN Clapacs, Jackquline Denmark, MD   50 mg at 04/10/20 0758  . magnesium hydroxide (MILK OF MAGNESIA) suspension 30 mL  30 mL Oral Daily PRN Gillermo Murdoch, NP      . prenatal multivitamin tablet 1 tablet  1 tablet Oral Q1200 Gillermo Murdoch, NP   1 tablet at 04/10/20 1132  . pyridOXINE (VITAMIN B-6) tablet 25 mg  25 mg Oral Daily Gillermo Murdoch, NP   25 mg at  04/10/20 6789    Lab Results: No results found for this or any previous visit (from the past 48 hour(s)).  Blood Alcohol level:  Lab Results  Component Value Date   ETH 26 (H) 04/07/2020   ETH <10 02/01/2020    Metabolic Disorder Labs: Lab Results  Component Value Date   HGBA1C 5.1 01/02/2018   MPG 99.67 01/02/2018   No results found for: PROLACTIN Lab Results  Component Value Date   CHOL 218 (H) 01/02/2018   TRIG 187 (H) 01/02/2018  HDL 81 01/02/2018   CHOLHDL 2.7 01/02/2018   VLDL 37 01/02/2018   LDLCALC 100 (H) 01/02/2018   LDLCALC 82 01/12/2016    Physical Findings: AIMS:  , ,  ,  ,    CIWA:    COWS:     Musculoskeletal: Strength & Muscle Tone: within normal limits Gait & Station: normal Patient leans: N/A  Psychiatric Specialty Exam: Physical Exam  Nursing note and vitals reviewed. Constitutional: She appears well-developed and well-nourished.  HENT:  Head: Normocephalic and atraumatic.  Respiratory: Effort normal.  Neurological: She is alert.    Review of Systems  Blood pressure 136/68, pulse 66, temperature 98 F (36.7 C), resp. rate 16, height 4\' 11"  (1.499 m), weight 75 kg, last menstrual period 12/04/2019, SpO2 100 %.Body mass index is 33.4 kg/m.  General Appearance: Disheveled  Eye Contact:  Fair  Speech:  Normal Rate  Volume:  Normal  Mood:  Anxious  Affect:  Congruent  Thought Process:  Coherent and Descriptions of Associations: Circumstantial  Orientation:  Full (Time, Place, and Person)  Thought Content:  Logical  Suicidal Thoughts:  No  Homicidal Thoughts:  No  Memory:  Immediate;   Fair Recent;   Fair Remote;   Fair  Judgement:  Intact  Insight:  Lacking  Psychomotor Activity:  Increased  Concentration:  Concentration: Fair and Attention Span: Fair  Recall:  [employer] of Knowledge:  Fair  Language:  Good  Akathisia:  Negative  Handed:  Right  AIMS (if indicated):     Assets:  Desire for Improvement Resilience  ADL's:   Intact  Cognition:  WNL  Sleep:  Number of Hours: 6.5     Treatment Plan Summary: Daily contact with patient to assess and evaluate symptoms and progress in treatment, Medication management and Plan : Patient is seen and examined.  Patient is a 34 year old female with the above-stated past psychiatric history who is seen in follow-up.   Diagnosis: #1 major depression, #2 cannabis use disorder, #3 cocaine use disorder, #4 intrauterine pregnancy  Patient is seen in follow-up.  She appears to be essentially unchanged from yesterday.  She still very anxious about what is going to happen after discharge.  No change in her current medications.  She also continues on a prenatal vitamin as well as vitamin B6.  She remains on fluoxetine for depression and anxiety.  No other changes in the medicines.  1.  Continue fluoxetine 20 mg p.o. daily for anxiety and depression. 2.  Continue hydroxyzine 50 mg p.o. every 6 hours as needed anxiety. 3.  Continue prenatal vitamins for nutritional supplementation. 4.  Continue B6 25 mg p.o. daily for nutritional supplementation. 5.  Disposition planning-in progress.  Sharma Covert, MD 04/10/2020, 12:09 PM

## 2020-04-10 NOTE — Plan of Care (Signed)
  Problem: Self-Concept: Goal: Ability to disclose and discuss suicidal ideas will improve Outcome: Not Progressing Goal: Will verbalize positive feelings about self Outcome: Not Progressing   Problem: Medication: Goal: Compliance with prescribed medication regimen will improve Outcome: Progressing   Problem: Health Behavior/Discharge Planning: Goal: Identification of resources available to assist in meeting health care needs will improve Outcome: Not Progressing   Problem: Coping: Goal: Coping ability will improve Outcome: Progressing   Problem: Education: Goal: Ability to make informed decisions regarding treatment will improve Outcome: Not Progressing   Problem: Self-Concept: Goal: Will verbalize positive feelings about self Outcome: Not Progressing Goal: Level of anxiety will decrease Outcome: Progressing   Problem: Role Relationship: Goal: Will demonstrate positive changes in social behaviors and relationships Outcome: Progressing   Problem: Health Behavior/Discharge Planning: Goal: Ability to make decisions will improve Outcome: Progressing Goal: Compliance with therapeutic regimen will improve Outcome: Progressing

## 2020-04-10 NOTE — Progress Notes (Incomplete)
Patient pleasant and cooperative. Engaging well with peers attended group and socializing in the milue.  Denies SI/HI/AVH does endorse depression and anxiety. Received Tylenol for complaint of generalized pain that she rated at six.  Approached staff for medication to help with anxiety and restlessness r/t to status of children. Patient received medication to help with symptoms.  Patient informed to contact staff with any concerns. Patient remains save on the unit with 15   ?

## 2020-04-11 DIAGNOSIS — Z9119 Patient's noncompliance with other medical treatment and regimen: Secondary | ICD-10-CM | POA: Diagnosis not present

## 2020-04-11 DIAGNOSIS — F332 Major depressive disorder, recurrent severe without psychotic features: Secondary | ICD-10-CM | POA: Diagnosis not present

## 2020-04-11 DIAGNOSIS — Z3A18 18 weeks gestation of pregnancy: Secondary | ICD-10-CM | POA: Diagnosis not present

## 2020-04-11 DIAGNOSIS — F1020 Alcohol dependence, uncomplicated: Secondary | ICD-10-CM | POA: Diagnosis not present

## 2020-04-11 LAB — HEPATITIS PANEL, ACUTE
HCV Ab: NONREACTIVE
Hep A IgM: NONREACTIVE
Hep B C IgM: NONREACTIVE
Hepatitis B Surface Ag: NONREACTIVE

## 2020-04-11 MED ORDER — HYDROXYZINE HCL 50 MG PO TABS
50.0000 mg | ORAL_TABLET | Freq: Two times a day (BID) | ORAL | Status: DC | PRN
  Administered 2020-04-11: 14:00:00 50 mg via ORAL
  Filled 2020-04-11: qty 1

## 2020-04-11 MED ORDER — ONDANSETRON HCL 4 MG PO TABS
4.0000 mg | ORAL_TABLET | Freq: Once | ORAL | Status: AC
  Administered 2020-04-11: 23:00:00 4 mg via ORAL
  Filled 2020-04-11: qty 1

## 2020-04-11 MED ORDER — FLUTICASONE PROPIONATE 50 MCG/ACT NA SUSP
2.0000 | Freq: Every day | NASAL | Status: DC
  Administered 2020-04-11 – 2020-04-12 (×2): 2 via NASAL
  Filled 2020-04-11: qty 16

## 2020-04-11 MED ORDER — HYDROXYZINE HCL 50 MG PO TABS
100.0000 mg | ORAL_TABLET | Freq: Every evening | ORAL | Status: DC | PRN
  Administered 2020-04-11: 21:00:00 100 mg via ORAL
  Filled 2020-04-11: qty 2

## 2020-04-11 MED ORDER — METRONIDAZOLE 500 MG PO TABS
500.0000 mg | ORAL_TABLET | Freq: Two times a day (BID) | ORAL | Status: DC
  Administered 2020-04-11 (×2): 500 mg via ORAL
  Filled 2020-04-11 (×5): qty 1

## 2020-04-11 NOTE — BHH Group Notes (Signed)
LCSW Wellness Group Note   04/11/2020 1300  Type of Group and Topic: Psychoeducational Group:  Wellness  Participation Level:  Active  Description of Group  Wellness group introduces the topic and its focus on developing healthy habits across the spectrum and its relationship to a decrease in hospital admissions.  Six areas of wellness are discussed: physical, social spiritual, intellectual, occupational, and emotional.  Patients are asked to consider their current wellness habits and to identify areas of wellness where they are interested and able to focus on improvements.    Therapeutic Goals 1. Patients will understand components of wellness and how they can positively impact overall health.  2. Patients will identify areas of wellness where they have developed good habits. 3. Patients will identify areas of wellness where they would like to make improvements.    Summary of Patient Progress: Pt attended group, appeared to have trouble focusing, left and returned at one point.  Pt did pay attention and was engaged.  Pt declined to identify the wellness areas she was doing good on or the area she wanted to improve on.     Therapeutic Modalities: Cognitive Behavioral Therapy Psychoeducation    Lorri Frederick, LCSW

## 2020-04-11 NOTE — Progress Notes (Signed)
[facility] MD Progress Note  04/11/2020 10:57 AM [name]  MRN:  [id number] Subjective:  Patient is a 34 year old female with a past psychiatric history significant for depression and substance abuse who presented voluntarily to the emergency room at [facility] on 04/08/2020 with decreased mood and suicidal ideation.  Objective: Patient is seen and examined.  Patient is a 34 year old female with the above-stated past psychiatric history who is seen in follow-up.  She is essentially unchanged from yesterday.  She stated that she did not sleep well last night.  She also stated that she was having "smelly discharge" from her vaginal area.  She denied auditory or visual hallucinations.  She denied any suicidal or homicidal ideation.  She still remains quite anxious.  There is some suspicion that she also has an intellectual disability.  No issues with her pregnancy currently.  Her vital signs are stable, she is afebrile.  Nursing notes reflect that she slept 5.25 hours last night.  No new laboratories.  Principal Problem: MDD (major depressive disorder), recurrent episode, severe ([place]) Diagnosis: Principal Problem:   MDD (major depressive disorder), recurrent episode, severe (HCC) Active Problems:   Stimulant use disorder (HCC) (cocaine)   Alcohol use disorder, moderate, dependence ([place])   Pregnant   Medically noncompliant  Total Time spent with patient: 20 minutes  Past Psychiatric History: See admission H&P  Past Medical History:  Past Medical History:  Diagnosis Date  . Anxiety   . Depression   . History of substance abuse ([place])   . History of suicide attempt   . History of thyroid disease   . Hypertension   . Thyroid disease     Past Surgical History:  Procedure Laterality Date  . CESAREAN SECTION  2000, 2002, 2013   Family History:  Family History  Problem Relation Age of Onset  . Diabetes Mother    Family Psychiatric  History: See admission  H&P Social History:  Social History   Substance and Sexual Activity  Alcohol Use Not Currently   Comment: social drinker     Social History   Substance and Sexual Activity  Drug Use Not Currently  . Types: Cocaine, Marijuana   Comment: Positive UDS 12/2017    Social History   Socioeconomic History  . Marital status: Single    Spouse name: Not on file  . Number of children: Not on file  . Years of education: Not on file  . Highest education level: Not on file  Occupational History  . Not on file  Tobacco Use  . Smoking status: Current Every Day Smoker    Packs/day: 0.50  . Smokeless tobacco: Never Used  Substance and Sexual Activity  . Alcohol use: Not Currently    Comment: social drinker  . Drug use: Not Currently    Types: Cocaine, Marijuana    Comment: Positive UDS 12/2017  . Sexual activity: Not Currently    Birth control/protection: None  Other Topics Concern  . Not on file  Social History Narrative  . Not on file   Social Determinants of Health   Financial Resource Strain:   . Difficulty of Paying Living Expenses:   Food Insecurity:   . Worried About Charity fundraiser in the Last Year:   . Arboriculturist in the Last Year:   Transportation Needs:   . Film/video editor (Medical):   Marland Kitchen Lack of Transportation (Non-Medical):   Physical Activity:   . Days of Exercise per Week:   .  Minutes of Exercise per Session:   Stress:   . Feeling of Stress :   Social Connections:   . Frequency of Communication with Friends and Family:   . Frequency of Social Gatherings with Friends and Family:   . Attends Religious Services:   . Active Member of Clubs or Organizations:   . Attends Banker Meetings:   Marland Kitchen Marital Status:    Additional Social History:                         Sleep: Good  Appetite:  Good  Current Medications: Current Facility-Administered Medications  Medication Dose Route Frequency Provider Last Rate Last Admin  .  acetaminophen (TYLENOL) tablet 650 mg  650 mg Oral Q6H PRN Gillermo Murdoch, NP   650 mg at 04/11/20 0802  . alum & mag hydroxide-simeth (MAALOX/MYLANTA) 200-200-20 MG/5ML suspension 30 mL  30 mL Oral Q4H PRN Gillermo Murdoch, NP      . diphenhydrAMINE (BENADRYL) injection 50 mg  50 mg Intramuscular Q6H PRN Clapacs, Jackquline Denmark, MD   50 mg at 04/08/20 1752  . feeding supplement (ENSURE ENLIVE) (ENSURE ENLIVE) liquid 237 mL  237 mL Oral BID BM Clapacs, John T, MD   237 mL at 04/10/20 0956  . FLUoxetine (PROZAC) capsule 20 mg  20 mg Oral Daily Clapacs, John T, MD      . haloperidol lactate (HALDOL) injection 5 mg  5 mg Intramuscular Q6H PRN Clapacs, Jackquline Denmark, MD   5 mg at 04/08/20 1752  . hydrOXYzine (ATARAX/VISTARIL) tablet 50 mg  50 mg Oral Q6H PRN Clapacs, Jackquline Denmark, MD   50 mg at 04/11/20 0501  . magnesium hydroxide (MILK OF MAGNESIA) suspension 30 mL  30 mL Oral Daily PRN Gillermo Murdoch, NP      . prenatal multivitamin tablet 1 tablet  1 tablet Oral Q1200 Gillermo Murdoch, NP   1 tablet at 04/10/20 1132  . pyridOXINE (VITAMIN B-6) tablet 25 mg  25 mg Oral Daily Gillermo Murdoch, NP   25 mg at 04/11/20 0801    Lab Results: No results found for this or any previous visit (from the past 48 hour(s)).  Blood Alcohol level:  Lab Results  Component Value Date   ETH 26 (H) 04/07/2020   ETH <10 02/01/2020    Metabolic Disorder Labs: Lab Results  Component Value Date   HGBA1C 5.1 01/02/2018   MPG 99.67 01/02/2018   No results found for: PROLACTIN Lab Results  Component Value Date   CHOL 218 (H) 01/02/2018   TRIG 187 (H) 01/02/2018   HDL 81 01/02/2018   CHOLHDL 2.7 01/02/2018   VLDL 37 01/02/2018   LDLCALC 100 (H) 01/02/2018   LDLCALC 82 01/12/2016    Physical Findings: AIMS:  , ,  ,  ,    CIWA:    COWS:     Musculoskeletal: Strength & Muscle Tone: within normal limits Gait & Station: normal Patient leans: N/A  Psychiatric Specialty Exam: Physical Exam  Nursing  note and vitals reviewed. Constitutional: She is oriented to person, place, and time. She appears well-developed and well-nourished.  HENT:  Head: Normocephalic and atraumatic.  Respiratory: Effort normal.  Neurological: She is alert and oriented to person, place, and time.    Review of Systems  Blood pressure (!) 108/47, pulse 70, temperature 98.5 F (36.9 C), temperature source Oral, resp. rate 16, height 4\' 11"  (1.499 m), weight 75 kg, last menstrual period 12/04/2019, SpO2 100 %.  Body mass index is 33.4 kg/m.  General Appearance: Disheveled  Eye Contact:  Fair  Speech:  Normal Rate  Volume:  Normal  Mood:  Anxious, Dysphoric and Irritable  Affect:  Congruent  Thought Process:  Goal Directed and Descriptions of Associations: Circumstantial  Orientation:  Negative  Thought Content:  Rumination  Suicidal Thoughts:  No  Homicidal Thoughts:  No  Memory:  Immediate;   Fair Recent;   Fair Remote;   Fair  Judgement:  Intact  Insight:  Lacking  Psychomotor Activity:  Increased  Concentration:  Concentration: Fair and Attention Span: Fair  Recall:  [employer] of Knowledge:  Fair  Language:  Fair  Akathisia:  Negative  Handed:  Right  AIMS (if indicated):     Assets:  Desire for Improvement Resilience  ADL's:  Intact  Cognition:  Impaired,  Mild  Sleep:  Number of Hours: 5.25     Treatment Plan Summary: Daily contact with patient to assess and evaluate symptoms and progress in treatment, Medication management and Plan : Patient is seen and examined.  Patient is a 34 year old female with the above-stated past psychiatric history who is seen in follow-up.   Diagnosis: #1 major depression, #2 cannabis use disorder, #3 cocaine use disorder, #4 intrauterine pregnancy, #5 vaginal discharge, #6 concern for underlying intellectual disability  Patient is seen in follow-up.  She is essentially unchanged from previous visits.  She continues on fluoxetine for anxiety and depression.  No  change in that medicine.  She also continues on hydroxyzine for anxiety.  She complains of a vaginal discharge, and review of her laboratories at least at [facility] facility showed the last time she was checked for any sexually transmitted diseases was 10/19.  Her wet prep was abnormal at that time.  I will go on and order HIV, RPR and treat with Flagyl.  She will need endocervical examination for other sexually transmitted diseases after discharge.  She continues to complain of difficulty with sleep, and I will change her hydroxyzine to 50 mg p.o. twice daily as needed anxiety, and 100 mg p.o. nightly as needed insomnia.  No other changes with her medications today.  1.  Continue fluoxetine 20 mg p.o. daily for anxiety and depression. 2.  Change hydroxyzine to 50 mg p.o. twice daily as needed anxiety and 100 mg p.o. nightly as needed insomnia. 3.  Flagyl 500 mg p.o. twice daily x7 days.  This is for bacterial vaginosis. 4.  Continue prenatal vitamins 1 tablet p.o. daily for nutritional supplementation. 5.  Continue pyridoxine 25 mg p.o. daily for nutritional supplementation. 6.  Order HIV, RPR, hepatitis for screening of sexually transmitted diseases. 7.  Disposition planning-in progress.  Antonieta Pert, MD 04/11/2020, 10:57 AM

## 2020-04-11 NOTE — Progress Notes (Signed)
Patient refused Prozac again this morning. MD will be notified.

## 2020-04-11 NOTE — Progress Notes (Addendum)
Client denies dep/HI/SI/AVH. Anxiety 8/10 d/t worrying about kids and concern for mom. Support and encouragement offered. Had mild back pain 4/10. Prn given for pain and anxiety.  Pain meds effective.Safety measures in place; 15 min check ongoing.  Client up at 0400 claiming insomnia. Writer told client to try to just allow her body to rest if unable to go to sleep so she not would disturb the other residents.  Client back up at 0500 rated anxiety 10/10; PRN given.

## 2020-04-11 NOTE — Plan of Care (Signed)
D- Patient alert and oriented. Patient presented in a pleasant mood on assessment reporting on her self-inventory that she slept poorly last night. Patient continues to report back pain, rating her pain level a "10/10", in which she did request pain medication from this Clinical research associate. Patient reported depression and anxiety on her self-inventory, however, she did not endorse any of this to this Clinical research associate. Patient stated that she was going to go back to sleep. Patient denied SI, HI, AVH at this time. Patient had no stated goals for today.  A- Some scheduled medications administered to patient, per MD orders. Support and encouragement provided.  Routine safety checks conducted every 15 minutes.  Patient informed to notify staff with problems or concerns.  R- No adverse drug reactions noted. Patient contracts for safety at this time. Patient compliant with medications and treatment plan. Patient receptive, calm, and cooperative. Patient interacts well with others on the unit.  Patient remains safe at this time.  Problem: Education: Goal: Knowledge of [facility] Education information/materials will improve Outcome: Progressing Goal: Emotional status will improve Outcome: Progressing Goal: Mental status will improve Outcome: Progressing Goal: Verbalization of understanding the information provided will improve Outcome: Progressing   Problem: Activity: Goal: Interest or engagement in activities will improve Outcome: Progressing Goal: Sleeping patterns will improve Outcome: Progressing   Problem: Coping: Goal: Ability to verbalize frustrations and anger appropriately will improve Outcome: Progressing Goal: Ability to demonstrate self-control will improve Outcome: Progressing   Problem: Health Behavior/Discharge Planning: Goal: Identification of resources available to assist in meeting health care needs will improve Outcome: Progressing Goal: Compliance with treatment plan for underlying cause  of condition will improve Outcome: Progressing   Problem: Physical Regulation: Goal: Ability to maintain clinical measurements within normal limits will improve Outcome: Progressing   Problem: Safety: Goal: Periods of time without injury will increase Outcome: Progressing   Problem: Education: Goal: Utilization of techniques to improve thought processes will improve Outcome: Progressing Goal: Knowledge of the prescribed therapeutic regimen will improve Outcome: Progressing   Problem: Activity: Goal: Interest or engagement in leisure activities will improve Outcome: Progressing Goal: Imbalance in normal sleep/wake cycle will improve Outcome: Progressing   Problem: Coping: Goal: Coping ability will improve Outcome: Progressing Goal: Will verbalize feelings Outcome: Progressing   Problem: Health Behavior/Discharge Planning: Goal: Ability to make decisions will improve Outcome: Progressing Goal: Compliance with therapeutic regimen will improve Outcome: Progressing   Problem: Role Relationship: Goal: Will demonstrate positive changes in social behaviors and relationships Outcome: Progressing   Problem: Safety: Goal: Ability to disclose and discuss suicidal ideas will improve Outcome: Progressing Goal: Ability to identify and utilize support systems that promote safety will improve Outcome: Progressing   Problem: Self-Concept: Goal: Will verbalize positive feelings about self Outcome: Progressing Goal: Level of anxiety will decrease Outcome: Progressing   Problem: Education: Goal: Ability to make informed decisions regarding treatment will improve Outcome: Progressing   Problem: Coping: Goal: Coping ability will improve Outcome: Progressing   Problem: Health Behavior/Discharge Planning: Goal: Identification of resources available to assist in meeting health care needs will improve Outcome: Progressing   Problem: Medication: Goal: Compliance with prescribed  medication regimen will improve Outcome: Progressing   Problem: Self-Concept: Goal: Ability to disclose and discuss suicidal ideas will improve Outcome: Progressing Goal: Will verbalize positive feelings about self Outcome: Progressing

## 2020-04-12 ENCOUNTER — Encounter: Payer: Self-pay | Admitting: Behavioral Health

## 2020-04-12 LAB — CBC WITH DIFFERENTIAL/PLATELET
Abs Immature Granulocytes: 0.07 10*3/uL (ref 0.00–0.07)
Basophils Absolute: 0 10*3/uL (ref 0.0–0.1)
Basophils Relative: 0 %
Eosinophils Absolute: 0.6 10*3/uL — ABNORMAL HIGH (ref 0.0–0.5)
Eosinophils Relative: 6 %
HCT: 28.9 % — ABNORMAL LOW (ref 36.0–46.0)
Hemoglobin: 9.7 g/dL — ABNORMAL LOW (ref 12.0–15.0)
Immature Granulocytes: 1 %
Lymphocytes Relative: 26 %
Lymphs Abs: 2.4 10*3/uL (ref 0.7–4.0)
MCH: 29.7 pg (ref 26.0–34.0)
MCHC: 33.6 g/dL (ref 30.0–36.0)
MCV: 88.4 fL (ref 80.0–100.0)
Monocytes Absolute: 0.5 10*3/uL (ref 0.1–1.0)
Monocytes Relative: 5 %
Neutro Abs: 5.5 10*3/uL (ref 1.7–7.7)
Neutrophils Relative %: 62 %
Platelets: 182 10*3/uL (ref 150–400)
RBC: 3.27 MIL/uL — ABNORMAL LOW (ref 3.87–5.11)
RDW: 14.5 % (ref 11.5–15.5)
WBC: 9 10*3/uL (ref 4.0–10.5)
nRBC: 0 % (ref 0.0–0.2)

## 2020-04-12 LAB — COMPREHENSIVE METABOLIC PANEL
ALT: 9 U/L (ref 0–44)
AST: 11 U/L — ABNORMAL LOW (ref 15–41)
Albumin: 3.1 g/dL — ABNORMAL LOW (ref 3.5–5.0)
Alkaline Phosphatase: 39 U/L (ref 38–126)
Anion gap: 9 (ref 5–15)
BUN: 6 mg/dL (ref 6–20)
CO2: 21 mmol/L — ABNORMAL LOW (ref 22–32)
Calcium: 8.5 mg/dL — ABNORMAL LOW (ref 8.9–10.3)
Chloride: 105 mmol/L (ref 98–111)
Creatinine, Ser: 0.58 mg/dL (ref 0.44–1.00)
GFR calc Af Amer: >60 mL/min (ref >60)
GFR calc non Af Amer: >60 mL/min (ref >60)
Glucose, Bld: 125 mg/dL — ABNORMAL HIGH (ref 70–99)
Potassium: 3.8 mmol/L (ref 3.5–5.1)
Sodium: 135 mmol/L (ref 135–145)
Total Bilirubin: 0.4 mg/dL (ref 0.3–1.2)
Total Protein: 6.2 g/dL — ABNORMAL LOW (ref 6.5–8.1)

## 2020-04-12 LAB — RPR: RPR Ser Ql: NONREACTIVE

## 2020-04-12 MED ORDER — METRONIDAZOLE 500 MG PO TABS: 500.0000 mg | ORAL_TABLET | Freq: Two times a day (BID) | ORAL | 0 refills | Status: AC

## 2020-04-12 MED ORDER — HYDROXYZINE HCL 50 MG PO TABS: 100.0000 mg | ORAL_TABLET | Freq: Every evening | ORAL | 0 refills | Status: DC | PRN

## 2020-04-12 MED ORDER — FLUOXETINE HCL 20 MG PO CAPS: 20.0000 mg | ORAL_CAPSULE | Freq: Every day | ORAL | 1 refills | Status: DC

## 2020-04-12 MED ORDER — HYDROXYZINE HCL 50 MG PO TABS: 50.0000 mg | ORAL_TABLET | Freq: Two times a day (BID) | ORAL | 1 refills | Status: DC | PRN

## 2020-04-12 MED ORDER — FLUTICASONE PROPIONATE 50 MCG/ACT NA SUSP: 2.0000 | Freq: Every day | NASAL | 1 refills | Status: DC

## 2020-04-12 NOTE — Progress Notes (Signed)
Recreation Therapy Notes  Date: 04/12/2020  Time: 9:30 am   Location: Craft room   Behavioral response: N/A   Intervention Topic: Self-esteem   Discussion/Intervention: Patient did not attend group.   Clinical Observations/Feedback:  Patient did not attend group.   Ruben Mahler LRT/CTRS        Sharrieff Spratlin 04/12/2020 12:01 PM

## 2020-04-12 NOTE — Progress Notes (Signed)
Patient ID: [name], female   DOB: 09-08-1986, 34 y.o.   MRN: [id number]   Discharge Note:  Patient denies SI/HI/AVH at this time. Discharge instructions, AVS, prescriptions, and transition record gone over with patient. Patient agrees to comply with medication management, follow-up visit, and outpatient therapy. Patient belongings were at bedside. Patient questions and concerns addressed and answered. Patient ambulatory off unit. Patient discharged to home via [employer], transportation services.

## 2020-04-12 NOTE — BHH Suicide Risk Assessment (Signed)
[facility] Discharge Suicide Risk Assessment   Principal Problem: MDD (major depressive disorder), recurrent episode, severe (HCC) Discharge Diagnoses: Principal Problem:   MDD (major depressive disorder), recurrent episode, severe (HCC) Active Problems:   Stimulant use disorder (HCC) (cocaine)   Alcohol use disorder, moderate, dependence (HCC)   Pregnant   Medically noncompliant   Total Time spent with patient: 30 minutes  Musculoskeletal: Strength & Muscle Tone: within normal limits Gait & Station: normal Patient leans: N/A  Psychiatric Specialty Exam: Review of Systems  Constitutional: Negative.   HENT: Negative.   Eyes: Negative.   Respiratory: Negative.   Cardiovascular: Negative.   Gastrointestinal: Negative.   Musculoskeletal: Negative.   Skin: Negative.   Neurological: Negative.   Psychiatric/Behavioral: Negative.     Blood pressure 138/64, pulse 76, temperature 98.2 F (36.8 C), temperature source Oral, resp. rate 18, height 4\' 11"  (1.499 m), weight 75 kg, last menstrual period 12/04/2019, SpO2 100 %.Body mass index is 33.4 kg/m.  General Appearance: Casual  Eye Contact::  Fair  Speech:  Clear and Coherent409  Volume:  Normal  Mood:  Euthymic  Affect:  Congruent  Thought Process:  Coherent  Orientation:  Full (Time, Place, and Person)  Thought Content:  Logical  Suicidal Thoughts:  No  Homicidal Thoughts:  No  Memory:  Immediate;   Fair Recent;   Fair Remote;   Fair  Judgement:  Fair  Insight:  Fair  Psychomotor Activity:  Normal  Concentration:  Fair  Recall:  [phone] of Knowledge:Fair  Language: Fair  Akathisia:  No  Handed:  Right  AIMS (if indicated):     Assets:  Desire for Improvement Housing Physical Health  Sleep:  Number of Hours: 5.75  Cognition: WNL  ADL's:  Intact   Mental Status Per Nursing Assessment::   On Admission:  Self-harm thoughts  Demographic Factors:  Unemployed  Loss Factors: NA  Historical  Factors: Impulsivity  Risk Reduction Factors:   Responsible for children under 26 years of age, Sense of responsibility to family, Positive social support and Positive therapeutic relationship  Continued Clinical Symptoms:  Depression:   Comorbid alcohol abuse/dependence  Cognitive Features That Contribute To Risk:  None    Suicide Risk:  Minimal: No identifiable suicidal ideation.  Patients presenting with no risk factors but with morbid ruminations; may be classified as minimal risk based on the severity of the depressive symptoms  Follow-up Information    [facility] Follow up.   Why: You are scheduled for a hospital follow up appointment on Friday, April 23rd at 1230pm.  Thank you. Contact information: [address] [place] [phone]           Plan Of Care/Follow-up recommendations:  Activity:  Activity as tolerated Diet:  Regular diet Other:  Follow-up with outpatient care through [facility]  [facility], MD 04/12/2020, 2:06 PM

## 2020-04-12 NOTE — BHH Group Notes (Signed)
Emotional Regulation 04/12/2020 1PM  Type of Therapy/Topic:  Group Therapy:  Emotion Regulation  Participation Level:  Did Not Attend   Description of Group:   The purpose of this group is to assist patients in learning to regulate negative emotions and experience positive emotions. Patients will be guided to discuss ways in which they have been vulnerable to their negative emotions. These vulnerabilities will be juxtaposed with experiences of positive emotions or situations, and patients will be challenged to use positive emotions to combat negative ones. Special emphasis will be placed on coping with negative emotions in conflict situations, and patients will process healthy conflict resolution skills.  Therapeutic Goals: 1. Patient will identify two positive emotions or experiences to reflect on in order to balance out negative emotions 2. Patient will label two or more emotions that they find the most difficult to experience 3. Patient will demonstrate positive conflict resolution skills through discussion and/or role plays  Summary of Patient Progress:       Therapeutic Modalities:   Cognitive Behavioral Therapy Feelings Identification Dialectical Behavioral Therapy   Suzan Slick, LCSW 04/12/2020 2:34 PM

## 2020-04-12 NOTE — Consult Note (Signed)
Consult History and Physical   SERVICE: Obstetrics  Patient Name: [name] [facility] Patient MRN:   [id number]  CC: RLQ abdominal pain  HPI: [name] is a 34 y.o. [id number] at [redacted]w[redacted]d by [redacted]w[redacted]d ultrasound with RLQ abdominal pain.  Chief Complaint: RLQ abdominal pain  Onset: "a while ago," but most recent episode in the past day or so  Location: RLQ  Duration: intermittent  Characteristics: aching  Severity: severe  Aggravating factors: none  Relieving factors: none  Treatment: Tylenol which has not seemed to help  Associated signs and symptoms: nausea, vomiting  Context: [name] reports a RLQ abdominal pain that has been going on for a while, but comes and goes. It started up again in the past day or so. She describes it as an aching pain and states that it is severe. She reports that she has tried Tylenol before, which doesn't seem to help much. She reports no vaginal bleeding, no abnormal vaginal discharge, no urinary symptoms (no dysuria, no urgency, and no frequency), and no bowel changes. She reports that she last moved her bowels yesterday and she usually goes every or day or so, with no recent changes. She has had some nausea and vomiting recently, but she thinks it is related to a medication that she took.    Past Obstetrical History: OB History    Gravida  7   Para  4   Term  3   Preterm  1   AB  1   Living  3     SAB  1   TAB      Ectopic      Multiple      Live Births  4        Obstetric Comments  Had a 35 year old that passed away        Past Gynecologic History: Patient's last menstrual period was 12/04/2019 (within months).   Past Medical History: Past Medical History:  Diagnosis Date  . Anxiety   . Depression   . History of substance abuse (HCC)   . History of suicide attempt   . History of thyroid disease   . Hypertension   . Thyroid disease     Past Surgical History:   Past Surgical History:  Procedure Laterality  Date  . CESAREAN SECTION  2000, 2002, 2013    Family History:  family history includes Diabetes in her mother.  Social History:  Social History   Socioeconomic History  . Marital status: Single    Spouse name: Not on file  . Number of children: Not on file  . Years of education: Not on file  . Highest education level: Not on file  Occupational History  . Not on file  Tobacco Use  . Smoking status: Current Every Day Smoker    Packs/day: 0.50  . Smokeless tobacco: Never Used  Substance and Sexual Activity  . Alcohol use: Not Currently    Comment: social drinker  . Drug use: Not Currently    Types: Cocaine, Marijuana    Comment: Positive UDS 12/2017  . Sexual activity: Not Currently    Birth control/protection: None  Other Topics Concern  . Not on file  Social History Narrative  . Not on file   Social Determinants of Health   Financial Resource Strain:   . Difficulty of Paying Living Expenses:   Food Insecurity:   . Worried About Programme researcher, broadcasting/film/video in the Last Year:   . [employer] of  Food in the Last Year:   Transportation Needs:   . Film/video editor (Medical):   Marland Kitchen Lack of Transportation (Non-Medical):   Physical Activity:   . Days of Exercise per Week:   . Minutes of Exercise per Session:   Stress:   . Feeling of Stress :   Social Connections:   . Frequency of Communication with Friends and Family:   . Frequency of Social Gatherings with Friends and Family:   . Attends Religious Services:   . Active Member of Clubs or Organizations:   . Attends Archivist Meetings:   Marland Kitchen Marital Status:   Intimate Partner Violence:   . Fear of Current or Ex-Partner:   . Emotionally Abused:   Marland Kitchen Physically Abused:   . Sexually Abused:     Home Medications:  Medications reconciled in EPIC  No current facility-administered medications on file prior to encounter.   Current Outpatient Medications on File Prior to Encounter  Medication Sig Dispense Refill  .  FLUoxetine (PROZAC) 20 MG capsule Take 1 capsule (20 mg total) by mouth daily. (Patient not taking: Reported on 04/08/2020) 30 capsule 1  . Prenatal Vit-Fe Fumarate-FA (PRENATAL MULTIVITAMIN) TABS tablet Take 1 tablet by mouth daily at 12 noon. 30 tablet 1  . pyridOXINE (VITAMIN B-6) 25 MG tablet Take 1 tablet (25 mg total) by mouth daily. 30 tablet 1    Allergies:  Allergies  Allergen Reactions  . Aspirin Anaphylaxis, Diarrhea and Nausea And Vomiting  . Banana Anaphylaxis, Itching, Shortness Of Breath and Swelling  . Other Itching, Shortness Of Breath and Swelling  . Peanut Oil Itching, Shortness Of Breath and Swelling  . Peanut-Containing Drug Products Shortness Of Breath, Itching and Swelling  . Pecan Nut (Diagnostic) Anaphylaxis    Physical Exam:  Temp:  [98.2 F (36.8 C)] 98.2 F (36.8 C) (04/19 0621) Pulse Rate:  [76] 76 (04/19 0621) Resp:  [18] 18 (04/19 0621) BP: (138)/(64) 138/64 (04/19 0621) SpO2:  [100 %] 100 % (04/19 0621)   General Appearance:  Well developed, well nourished, no acute distress, alert and oriented, cooperative and appears stated age 81:  Normocephalic atraumatic, extraocular movements intact, moist mucous membranes, neck supple with midline trachea and thyroid without masses Cardiovascular:  Normal S1/S2, regular rate and rhythm, no murmurs, 2+ distal pulses Pulmonary:  clear to auscultation, no wheezes, rales or rhonchi, symmetric air entry, good air exchange Abdomen:  Bowel sounds present, soft, RLQ tenderness to palpataion, nondistended, no abnormal masses or organomegaly, no epigastric pain, gravid, + fetal movement and normal FHR on tablet ultrasound Back: inspection of back is normal, no CVAT Extremities:  extremities normal, no tenderness, atraumatic, no cyanosis or edema Skin:  normal coloration and turgor, no rashes, no suspicious skin lesions noted  Neurologic:  Grossly equal strength and muscle tone, normal speech, no focal findings or  movement disorder noted. Psychiatric:  Normal mood and affect, appropriate, no AH/VH Pelvic:  Deferred    Labs/Studies:   CBC and Coags:  Lab Results  Component Value Date   WBC 9.0 04/12/2020   NEUTOPHILPCT 62 04/12/2020   EOSPCT 6 04/12/2020   BASOPCT 0 04/12/2020   LYMPHOPCT 26 04/12/2020   HGB 9.7 (L) 04/12/2020   HCT 28.9 (L) 04/12/2020   MCV 88.4 04/12/2020   PLT 182 04/12/2020   CMP:  Lab Results  Component Value Date   NA 135 04/12/2020   K 3.8 04/12/2020   CL 105 04/12/2020   CO2 21 (L) 04/12/2020  BUN 6 04/12/2020   CREATININE 0.58 04/12/2020   CREATININE 0.51 04/07/2020   CREATININE 0.67 02/01/2020   PROT 6.2 (L) 04/12/2020   BILITOT 0.4 04/12/2020   ALT 9 04/12/2020   AST 11 (L) 04/12/2020   ALKPHOS 39 04/12/2020    Other Imaging: US OB Limited > 14 wks  Result Date: 04/08/2020 CLINICAL DATA:  Substance abuse, decreased fetal movement EXAM: LIMITED OBSTETRIC ULTRASOUND FINDINGS: Number of Fetuses: 1 Heart Rate:  155 bpm Movement: Yes Presentation: Cephalic Placental Location: Anterior Previa: No Amniotic Fluid (Subjective):  Within normal limits. BPD: 4.0 cm 18 w  1 d MATERNAL FINDINGS: Cervix:  Appears closed. Uterus/Adnexae: Right ovary measures 3.0 x 1.6 x 2.0 cm and the left ovary measures 2.8 x 1.9 x 2.4 cm. No free fluid. IMPRESSION: 1. Single live intrauterine pregnancy as above, estimated age 68 weeks and 1 day. This exam is performed on an emergent basis and does not comprehensively evaluate fetal size, dating, or anatomy; follow-up complete OB US should be considered if further fetal assessment is warranted. Electronically Signed   By: Sharlet Salina M.D.   On: 04/08/2020 02:36     Assessment / Plan:   [name] is a 34 y.o. [id number] who presents with RLQ abdominal pain  1. RLQ abdominal pain: -Physical exam with mild RLQ tenderness to palpation -UA on 04/08/2020 not concerning for UTI -Urine GC/CT ordered to evaluate for STI -WBC  WNL, afebrile; concern for acute appendicitis low at this time -Reviewed that pain could be muscle and ligament discomforts of pregnancy, for which I would recommend adequate hydration, Tylenol PRN, and pregnancy support band.   2. Pregnancy: -Patient is [redacted]w[redacted]d. She states that she has not established routine prenatal care yet, but she has been seen by [facility] and [facility] during past pregnancies. Reviewed the importance of regular prenatal care, particularly given her history of preterm deliveries. Patient verbalized understanding and states that she will work to establish care.    Discussed my recommendations with NP [facility], along with fact that patient may benefit from hospitalist consult to determine any appropriate imaging for this patient.    Thank you for the opportunity to be involved with this patient's care.  ----- Genia Del, CNM Midwife [facility], Department of OB/GYN [facility]

## 2020-04-12 NOTE — Progress Notes (Signed)
Patient refused some of her medication this morning stating that she hasn't been feeling good since last night and she doesn't know if the medication is making her feel this way. NP was notified.

## 2020-04-12 NOTE — [facility] Suicide Risk Assessment (Signed)
[facility] INPATIENT:  Family/Significant Other Suicide Prevention Education  Suicide Prevention Education:  Contact Attempts: [name], father, [phone] has been identified by the patient as the family member/significant other with whom the patient will be residing, and identified as the person(s) who will aid the patient in the event of a mental health crisis.  With written consent from the patient, two attempts were made to provide suicide prevention education, prior to and/or following the patient's discharge.  We were unsuccessful in providing suicide prevention education.  A suicide education pamphlet was given to the patient to share with family/significant other.  Date and time of first attempt: 04/12/2020 at 2:16PM Date and time of second attempt: Second attempt is needed.  CSW notes that the line rang incessantly.  CSW was unable to leave a HIPAA compliant voicemail as the option was not provided.   [name] 04/12/2020, 2:16 PM

## 2020-04-12 NOTE — Discharge Summary (Signed)
Physician Discharge Summary Note  Patient:  [name] is an 34 y.o., female MRN:  [id number] DOB:  12-Mar-1986 Patient phone:  [phone] (home)  Patient address:   [address] [place] [address],  Total Time spent with patient: 30 minutes  Date of Admission:  04/08/2020 Date of Discharge: 04/12/20  Reason for Admission:  34 year old woman with a history of depression and substance abuse presented voluntarily to the emergency room reporting multiple symptoms of depression.  She tells me that her mood feels terrible.  She feels down and sad and negative all the time.  She feels hopeless.  Claims that her family has abandoned her and rejected her and she has no place to live.  She has been sleeping poorly and not eating well at all.  Patient has reported suicidal thoughts without specific plan.  Principal Problem: MDD (major depressive disorder), recurrent episode, severe (HCC) Discharge Diagnoses: Principal Problem:   MDD (major depressive disorder), recurrent episode, severe (HCC) Active Problems:   Stimulant use disorder (HCC) (cocaine)   Alcohol use disorder, moderate, dependence (HCC)   Pregnant   Medically noncompliant   Past Psychiatric History: Past history of multiple presentations for treatment of similar symptoms depression and substance abuse.  Recently discharged from the hospital here in February.  Patient has been treated with antidepressant medicine in the past and has some history of outpatient treatment but often is noncompliant.  Past Medical History:  Past Medical History:  Diagnosis Date  . Anxiety   . Depression   . History of substance abuse (HCC)   . History of suicide attempt   . History of thyroid disease   . Hypertension   . Thyroid disease     Past Surgical History:  Procedure Laterality Date  . CESAREAN SECTION  2000, 2002, 2013   Family History:  Family History  Problem Relation Age of Onset  . Diabetes Mother    Family  Psychiatric  History: None reported Social History:  Social History   Substance and Sexual Activity  Alcohol Use Not Currently   Comment: social drinker     Social History   Substance and Sexual Activity  Drug Use Not Currently  . Types: Cocaine, Marijuana   Comment: Positive UDS 12/2017    Social History   Socioeconomic History  . Marital status: Single    Spouse name: Not on file  . Number of children: Not on file  . Years of education: Not on file  . Highest education level: Not on file  Occupational History  . Not on file  Tobacco Use  . Smoking status: Current Every Day Smoker    Packs/day: 0.50  . Smokeless tobacco: Never Used  Substance and Sexual Activity  . Alcohol use: Not Currently    Comment: social drinker  . Drug use: Not Currently    Types: Cocaine, Marijuana    Comment: Positive UDS 12/2017  . Sexual activity: Not Currently    Birth control/protection: None  Other Topics Concern  . Not on file  Social History Narrative  . Not on file   Social Determinants of Health   Financial Resource Strain:   . Difficulty of Paying Living Expenses:   Food Insecurity:   . Worried About Programme researcher, broadcasting/film/video in the Last Year:   . Barista in the Last Year:   Transportation Needs:   . Freight forwarder (Medical):   Marland Kitchen Lack of Transportation (Non-Medical):   Physical Activity:   .  Days of Exercise per Week:   . Minutes of Exercise per Session:   Stress:   . Feeling of Stress :   Social Connections:   . Frequency of Communication with Friends and Family:   . Frequency of Social Gatherings with Friends and Family:   . Attends Religious Services:   . Active Member of Clubs or Organizations:   . Attends Banker Meetings:   Marland Kitchen Marital Status:     Hospital Course:  Patient remained on the [facility] unit for 4 days. The patient stabilized on medication and therapy. Patient was discharged on Prozac 20 mg Daily, Vistaril 25 mg BID and 50 mg QHS PRN.  Patient has shown improvement with improved mood, affect, sleep, appetite, and interaction. Patient has attended group and participated. Patient has been seen in the day room interacting with peers and staff appropriately. Patient denies any SI/HI/AVH and contracts for safety. Patient agrees to follow up at [facility]. Patient is provided with prescriptions for their medications upon discharge.  Patient did have complaint of right lower abdominal pain earlier today and was evaluated by OB/GYN.  Patient was cleared by OB and was requested for her to follow-up with her outpatient provider.  However once patient was seen by OB patient was up and out of her bed with minimal complain about right lower abdominal pain and was in agreement with discharge and was requesting to discharge today.  Physical Findings: AIMS:  , ,  ,  ,    CIWA:    COWS:     Musculoskeletal: Strength & Muscle Tone: within normal limits Gait & Station: normal Patient leans: N/A  Psychiatric Specialty Exam: Physical Exam  Nursing note and vitals reviewed. Constitutional: She is oriented to person, place, and time. She appears well-developed and well-nourished.  Cardiovascular: Normal rate.  Respiratory: Effort normal.  Musculoskeletal:        General: Normal range of motion.  Neurological: She is alert and oriented to person, place, and time.  Skin: Skin is warm.    Review of Systems  Constitutional: Negative.   HENT: Negative.   Eyes: Negative.   Respiratory: Negative.   Cardiovascular: Negative.   Gastrointestinal: Negative.   Genitourinary: Negative.   Musculoskeletal: Negative.   Skin: Negative.   Neurological: Negative.   Psychiatric/Behavioral: Negative.     Blood pressure 138/64, pulse 76, temperature 98.2 F (36.8 C), temperature source Oral, resp. rate 18, height 4\' 11"  (1.499 m), weight 75 kg, last menstrual period 12/04/2019, SpO2 100 %.Body mass index is 33.4 kg/m.   General Appearance: Casual  Eye  Contact::  Fair  Speech:  Clear and Coherent409  Volume:  Normal  Mood:  Euthymic  Affect:  Congruent  Thought Process:  Coherent  Orientation:  Full (Time, Place, and Person)  Thought Content:  Logical  Suicidal Thoughts:  No  Homicidal Thoughts:  No  Memory:  Immediate;   Fair Recent;   Fair Remote;   Fair  Judgement:  Fair  Insight:  Fair  Psychomotor Activity:  Normal  Concentration:  Fair  Recall:  [phone] of Knowledge:Fair  Language: Fair  Akathisia:  No  Handed:  Right  AIMS (if indicated):     Assets:  Desire for Improvement Housing Physical Health  Sleep:  Number of Hours: 5.75  Cognition: WNL  ADL's:  Intact      Has this patient used any form of tobacco in the last 30 days? (Cigarettes, Smokeless Tobacco, Cigars, and/or Pipes) Yes, No  Blood Alcohol level:  Lab Results  Component Value Date   ETH 26 (H) 04/07/2020   ETH <10 02/01/2020    Metabolic Disorder Labs:  Lab Results  Component Value Date   HGBA1C 5.1 01/02/2018   MPG 99.67 01/02/2018   No results found for: PROLACTIN Lab Results  Component Value Date   CHOL 218 (H) 01/02/2018   TRIG 187 (H) 01/02/2018   HDL 81 01/02/2018   CHOLHDL 2.7 01/02/2018   VLDL 37 01/02/2018   LDLCALC 100 (H) 01/02/2018   LDLCALC 82 01/12/2016    See Psychiatric Specialty Exam and Suicide Risk Assessment completed by Attending Physician prior to discharge.  Discharge destination:  Home  Is patient on multiple antipsychotic therapies at discharge:  No   Has Patient had three or more failed trials of antipsychotic monotherapy by history:  No  Recommended Plan for Multiple Antipsychotic Therapies: NA  Discharge Instructions    Diet - low sodium heart healthy   Complete by: As directed    Increase activity slowly   Complete by: As directed      Allergies as of 04/12/2020      Reactions   Aspirin Anaphylaxis, Diarrhea, Nausea And Vomiting   Banana Anaphylaxis, Itching, Shortness Of Breath, Swelling    Other Itching, Shortness Of Breath, Swelling   Peanut Oil Itching, Shortness Of Breath, Swelling   Peanut-containing Drug Products Shortness Of Breath, Itching, Swelling   Pecan Nut (diagnostic) Anaphylaxis      Medication List    TAKE these medications     Indication  FLUoxetine 20 MG capsule Commonly known as: PROZAC Take 1 capsule (20 mg total) by mouth daily.  Indication: Major Depressive Disorder   fluticasone 50 MCG/ACT nasal spray Commonly known as: FLONASE Place 2 sprays into both nostrils daily. Start taking on: April 13, 2020  Indication: Signs and Symptoms of Nose Diseases   hydrOXYzine 50 MG tablet Commonly known as: ATARAX/VISTARIL Take 2 tablets (100 mg total) by mouth at bedtime as needed (insomnia).  Indication: sleep   hydrOXYzine 50 MG tablet Commonly known as: ATARAX/VISTARIL Take 1 tablet (50 mg total) by mouth 2 (two) times daily as needed for anxiety.  Indication: Feeling Anxious   metroNIDAZOLE 500 MG tablet Commonly known as: FLAGYL Take 1 tablet (500 mg total) by mouth every 12 (twelve) hours for 5 days.  Indication: Vaginosis caused by Bacteria   prenatal multivitamin Tabs tablet Take 1 tablet by mouth daily at 12 noon.  Indication: Pregnancy   pyridOXINE 25 MG tablet Commonly known as: VITAMIN B-6 Take 1 tablet (25 mg total) by mouth daily.  Indication: Nausea and Vomiting in Pregnancy      Follow-up Information    [facility] Follow up.   Why: You are scheduled for a hospital follow up appointment on Friday, April 23rd at 1230pm.  Thank you. Contact information: [address] [place] [address] [phone]           Follow-up recommendations:  Continue activity as tolerated. Continue diet as recommended by your PCP. Ensure to keep all appointments with outpatient providers.  Comments:  Patient is instructed prior to discharge to: Take all medications as prescribed by his/her mental healthcare  provider. Report any adverse effects and or reactions from the medicines to his/her outpatient provider promptly. Patient has been instructed & cautioned: To not engage in alcohol and or illegal drug use while on prescription medicines. In the event of worsening symptoms, patient is instructed to  call the crisis hotline, 911 and or go to the nearest ED for appropriate evaluation and treatment of symptoms. To follow-up with his/her primary care provider for your other medical issues, concerns and or health care needs.    Signed: Lowry Ram Geron Mulford, FNP 04/12/2020, 1:59 PM

## 2020-04-12 NOTE — [facility] Group Notes (Signed)
[facility] Group Notes:  (Nursing/MHT/Case Management/Adjunct)  Date:  04/12/2020  Time:  11:44 AM  Type of Therapy:  Psychoeducational Skills  Participation Level:  Did Not Attend  Summary of Progress/Problems:  Kerrie Pleasure 04/12/2020, 11:44 AM

## 2020-04-13 LAB — HIV-1 RNA QUANT-NO REFLEX-BLD
HIV 1 RNA Quant: <20 copies/mL
LOG10 HIV-1 RNA: UNDETERMINED log10copy/mL

## 2020-05-11 ENCOUNTER — Encounter: Payer: Self-pay | Admitting: Emergency Medicine

## 2020-05-11 ENCOUNTER — Other Ambulatory Visit: Payer: Self-pay

## 2020-05-11 ENCOUNTER — Emergency Department
Admission: EM | Admit: 2020-05-11 | Discharge: 2020-05-23 | Disposition: A | Discharge status: Home / Self Care | Payer: Medicaid Other | Attending: Emergency Medicine | Admitting: Emergency Medicine

## 2020-05-11 DIAGNOSIS — F1420 Cocaine dependence, uncomplicated: Secondary | ICD-10-CM | POA: Diagnosis present

## 2020-05-11 DIAGNOSIS — U071 COVID-19: Secondary | ICD-10-CM | POA: Insufficient documentation

## 2020-05-11 DIAGNOSIS — F1910 Other psychoactive substance abuse, uncomplicated: Secondary | ICD-10-CM | POA: Diagnosis not present

## 2020-05-11 DIAGNOSIS — O99332 Smoking (tobacco) complicating pregnancy, second trimester: Secondary | ICD-10-CM | POA: Diagnosis not present

## 2020-05-11 DIAGNOSIS — O0933 Supervision of pregnancy with insufficient antenatal care, third trimester: Secondary | ICD-10-CM

## 2020-05-11 DIAGNOSIS — R109 Unspecified abdominal pain: Secondary | ICD-10-CM | POA: Diagnosis present

## 2020-05-11 DIAGNOSIS — F32A Depression, unspecified: Secondary | ICD-10-CM

## 2020-05-11 DIAGNOSIS — F332 Major depressive disorder, recurrent severe without psychotic features: Secondary | ICD-10-CM | POA: Diagnosis present

## 2020-05-11 DIAGNOSIS — F1510 Other stimulant abuse, uncomplicated: Secondary | ICD-10-CM | POA: Diagnosis not present

## 2020-05-11 DIAGNOSIS — O98512 Other viral diseases complicating pregnancy, second trimester: Secondary | ICD-10-CM | POA: Diagnosis not present

## 2020-05-11 DIAGNOSIS — R45851 Suicidal ideations: Secondary | ICD-10-CM | POA: Diagnosis present

## 2020-05-11 DIAGNOSIS — F1410 Cocaine abuse, uncomplicated: Secondary | ICD-10-CM | POA: Insufficient documentation

## 2020-05-11 DIAGNOSIS — Z91199 Patient's noncompliance with other medical treatment and regimen due to unspecified reason: Secondary | ICD-10-CM

## 2020-05-11 DIAGNOSIS — O99313 Alcohol use complicating pregnancy, third trimester: Secondary | ICD-10-CM | POA: Diagnosis not present

## 2020-05-11 DIAGNOSIS — Z3A22 22 weeks gestation of pregnancy: Secondary | ICD-10-CM | POA: Diagnosis not present

## 2020-05-11 DIAGNOSIS — F1210 Cannabis abuse, uncomplicated: Secondary | ICD-10-CM | POA: Insufficient documentation

## 2020-05-11 DIAGNOSIS — Z79899 Other long term (current) drug therapy: Secondary | ICD-10-CM | POA: Insufficient documentation

## 2020-05-11 DIAGNOSIS — O99322 Drug use complicating pregnancy, second trimester: Secondary | ICD-10-CM | POA: Diagnosis not present

## 2020-05-11 DIAGNOSIS — F329 Major depressive disorder, single episode, unspecified: Secondary | ICD-10-CM | POA: Diagnosis not present

## 2020-05-11 DIAGNOSIS — O99342 Other mental disorders complicating pregnancy, second trimester: Secondary | ICD-10-CM | POA: Insufficient documentation

## 2020-05-11 DIAGNOSIS — F17200 Nicotine dependence, unspecified, uncomplicated: Secondary | ICD-10-CM | POA: Diagnosis present

## 2020-05-11 DIAGNOSIS — F1020 Alcohol dependence, uncomplicated: Secondary | ICD-10-CM | POA: Diagnosis present

## 2020-05-11 DIAGNOSIS — F17210 Nicotine dependence, cigarettes, uncomplicated: Secondary | ICD-10-CM | POA: Insufficient documentation

## 2020-05-11 DIAGNOSIS — I10 Essential (primary) hypertension: Secondary | ICD-10-CM | POA: Diagnosis not present

## 2020-05-11 DIAGNOSIS — Z91010 Allergy to peanuts: Secondary | ICD-10-CM | POA: Diagnosis not present

## 2020-05-11 DIAGNOSIS — X789XXA Intentional self-harm by unspecified sharp object, initial encounter: Secondary | ICD-10-CM | POA: Diagnosis present

## 2020-05-11 DIAGNOSIS — O99323 Drug use complicating pregnancy, third trimester: Secondary | ICD-10-CM | POA: Diagnosis not present

## 2020-05-11 DIAGNOSIS — Z3490 Encounter for supervision of normal pregnancy, unspecified, unspecified trimester: Secondary | ICD-10-CM

## 2020-05-11 DIAGNOSIS — R4589 Other symptoms and signs involving emotional state: Secondary | ICD-10-CM | POA: Diagnosis present

## 2020-05-11 DIAGNOSIS — S61519A Laceration without foreign body of unspecified wrist, initial encounter: Secondary | ICD-10-CM | POA: Diagnosis present

## 2020-05-11 DIAGNOSIS — F1590 Other stimulant use, unspecified, uncomplicated: Secondary | ICD-10-CM | POA: Diagnosis present

## 2020-05-11 DIAGNOSIS — F1220 Cannabis dependence, uncomplicated: Secondary | ICD-10-CM | POA: Diagnosis present

## 2020-05-11 DIAGNOSIS — O99343 Other mental disorders complicating pregnancy, third trimester: Secondary | ICD-10-CM | POA: Diagnosis not present

## 2020-05-11 LAB — COMPREHENSIVE METABOLIC PANEL
ALT: 13 U/L (ref 0–44)
AST: 18 U/L (ref 15–41)
Albumin: 3.5 g/dL (ref 3.5–5.0)
Alkaline Phosphatase: 72 U/L (ref 38–126)
Anion gap: 11 (ref 5–15)
BUN: 9 mg/dL (ref 6–20)
CO2: 23 mmol/L (ref 22–32)
Calcium: 8.8 mg/dL — ABNORMAL LOW (ref 8.9–10.3)
Chloride: 102 mmol/L (ref 98–111)
Creatinine, Ser: 0.58 mg/dL (ref 0.44–1.00)
GFR calc Af Amer: >60 mL/min (ref >60)
GFR calc non Af Amer: >60 mL/min (ref >60)
Glucose, Bld: 74 mg/dL (ref 70–99)
Potassium: 3.6 mmol/L (ref 3.5–5.1)
Sodium: 136 mmol/L (ref 135–145)
Total Bilirubin: 0.7 mg/dL (ref 0.3–1.2)
Total Protein: 7.5 g/dL (ref 6.5–8.1)

## 2020-05-11 LAB — ETHANOL: Alcohol, Ethyl (B): <10 mg/dL (ref <10)

## 2020-05-11 LAB — CBC
HCT: 33.4 % — ABNORMAL LOW (ref 36.0–46.0)
Hemoglobin: 11.5 g/dL — ABNORMAL LOW (ref 12.0–15.0)
MCH: 30 pg (ref 26.0–34.0)
MCHC: 34.4 g/dL (ref 30.0–36.0)
MCV: 87.2 fL (ref 80.0–100.0)
Platelets: 317 10*3/uL (ref 150–400)
RBC: 3.83 MIL/uL — ABNORMAL LOW (ref 3.87–5.11)
RDW: 13.7 % (ref 11.5–15.5)
WBC: 15.4 10*3/uL — ABNORMAL HIGH (ref 4.0–10.5)
nRBC: 0 % (ref 0.0–0.2)

## 2020-05-11 MED ORDER — DIPHENHYDRAMINE HCL 50 MG/ML IJ SOLN
50.0000 mg | Freq: Once | INTRAMUSCULAR | Status: AC
  Administered 2020-05-11: 50 mg via INTRAMUSCULAR
  Filled 2020-05-11: qty 1

## 2020-05-11 NOTE — ED Notes (Signed)
When asked, pt states she has thoughts and a plan to harm self. States she will "cut her wrists". Denies having any weapons with her. Tearful. Bed locked low. Rails up. Will notify EDP and have pt dress out.

## 2020-05-11 NOTE — ED Notes (Signed)
Pt cursing and banging on items in the lobby.

## 2020-05-11 NOTE — ED Notes (Signed)
Charge RN and First RN aware of situation, pt placed back in lobby at this time.

## 2020-05-11 NOTE — ED Notes (Signed)
Advised pt to try and control her breathing and help calm herself. Injection given. Pt given food to eat. Sitter at the bedside. Pt given a remote per request. Blood obtained and sent to the lab.

## 2020-05-11 NOTE — ED Notes (Addendum)
Pt given [place] sandwich tray and ginger ale with ice. This tech at bedside as 1:1 Recruitment consultant.

## 2020-05-11 NOTE — ED Notes (Signed)
Pt sleeping at this time; this tech remains as 1:1 Recruitment consultant.

## 2020-05-11 NOTE — ED Notes (Signed)
Per Dr. Scotty Court, no orders for blood work, okay to not dress out due to patient endorsing no SI/HI at this time.

## 2020-05-11 NOTE — ED Notes (Signed)
Pt dressed out by this RN and Roanna Raider, Charity fundraiser. All cords removed from room.

## 2020-05-11 NOTE — ED Provider Notes (Signed)
[facility] Emergency Department Provider Note  Time seen: 11:20 PM  I have reviewed the triage vital signs and the nursing notes.   HISTORY  Chief Complaint Depression   HPI [name] is a 34 y.o. female with a past medical history of anxiety, depression, substance abuse, presents to the emergency department for suicidal ideation approximately 6 months pregnant.  According to the patient she is G7, P5 A1 who presents with suicidal ideation with a plan to cut her wrists to kill her self.  Patient states she found her mother dead at home yesterday and ever since has had thoughts of killing herself.  Patient is quite distraught in the emergency department crying loudly.  Patient is overall cooperative.  Patient does admit to recent cocaine marijuana and alcohol use but denies any tonight.   Past Medical History:  Diagnosis Date  . Anxiety   . Depression   . History of substance abuse (HCC)   . History of suicide attempt   . History of thyroid disease   . Hypertension   . Thyroid disease     Patient Active Problem List   Diagnosis Date Noted  . MDD (major depressive disorder), recurrent episode, severe (HCC) 04/08/2020  . Thoughts of self harm 02/01/2020  . Abdominal pain 03/20/2018  . Cocaine use disorder, moderate, dependence (HCC) 01/01/2018  . Cannabis use disorder, moderate, dependence (HCC) 01/01/2018  . Pregnant 12/31/2017  . Medically noncompliant 12/31/2017  . Tobacco use disorder 01/12/2016  . Stimulant use disorder (HCC) (cocaine) 01/12/2016  . Alcohol use disorder, moderate, dependence (HCC) 01/12/2016  . Self-inflicted laceration of wrist (HCC) 01/11/2016  . MDD (major depressive disorder) 01/11/2016    Past Surgical History:  Procedure Laterality Date  . CESAREAN SECTION  2000, 2002, 2013    Prior to Admission medications   Medication Sig Start Date End Date Taking? Authorizing Provider  FLUoxetine (PROZAC) 20 MG capsule Take  1 capsule (20 mg total) by mouth daily. 04/12/20   Money, Gerlene Burdock, FNP  fluticasone (FLONASE) 50 MCG/ACT nasal spray Place 2 sprays into both nostrils daily. 04/13/20   Money, Gerlene Burdock, FNP  hydrOXYzine (ATARAX/VISTARIL) 50 MG tablet Take 2 tablets (100 mg total) by mouth at bedtime as needed (insomnia). 04/12/20   Money, Gerlene Burdock, FNP  hydrOXYzine (ATARAX/VISTARIL) 50 MG tablet Take 1 tablet (50 mg total) by mouth 2 (two) times daily as needed for anxiety. 04/12/20   Money, Gerlene Burdock, FNP  Prenatal Vit-Fe Fumarate-FA (PRENATAL MULTIVITAMIN) TABS tablet Take 1 tablet by mouth daily at 12 noon. 02/06/20   Money, Gerlene Burdock, FNP  pyridOXINE (VITAMIN B-6) 25 MG tablet Take 1 tablet (25 mg total) by mouth daily. 02/06/20   Money, Gerlene Burdock, FNP    Allergies  Allergen Reactions  . Aspirin Anaphylaxis, Diarrhea and Nausea And Vomiting  . Banana Anaphylaxis, Itching, Shortness Of Breath and Swelling  . Other Itching, Shortness Of Breath and Swelling  . Peanut Oil Itching, Shortness Of Breath and Swelling  . Peanut-Containing Drug Products Shortness Of Breath, Itching and Swelling  . Pecan Nut (Diagnostic) Anaphylaxis    Family History  Problem Relation Age of Onset  . Diabetes Mother     Social History Social History   Tobacco Use  . Smoking status: Current Every Day Smoker    Packs/day: 0.50  . Smokeless tobacco: Never Used  Substance Use Topics  . Alcohol use: Not Currently    Comment: social drinker  . Drug use: Not  Currently    Types: Cocaine, Marijuana    Comment: Positive UDS 12/2017    Review of Systems Constitutional: Negative for fever. Cardiovascular: Negative for chest pain. Respiratory: Negative for shortness of breath. Gastrointestinal: Negative for abdominal pain.  Continues to feel her baby move.  Denies vaginal bleeding or discharge. Musculoskeletal: Negative for musculoskeletal complaints Neurological: Negative for headache All other ROS  negative  ____________________________________________   PHYSICAL EXAM:  VITAL SIGNS: ED Triage Vitals  Enc Vitals Group     BP 05/11/20 1758 (!) 143/73     Pulse Rate 05/11/20 1758 98     Resp 05/11/20 1758 (!) 22     Temp 05/11/20 1758 98 F (36.7 C)     Temp Source 05/11/20 1758 Oral     SpO2 05/11/20 1758 99 %     Weight 05/11/20 1759 162 lb (73.5 kg)     Height 05/11/20 1759 4\' 11"  (1.499 m)     Head Circumference --      Peak Flow --      Pain Score 05/11/20 1758 0     Pain Loc --      Pain Edu? --      Excl. in GC? --     Constitutional: Alert and oriented.  Mild distress/patient is distraught, crying loudly. Eyes: Normal exam ENT      Head: Normocephalic and atraumatic.      Mouth/Throat: Mucous membranes are moist. Cardiovascular: Normal rate, regular rhythm.  Respiratory: Normal respiratory effort without tachypnea nor retractions. Breath sounds are clear  Gastrointestinal: Gravid abdomen.  Nontender. Musculoskeletal: Nontender with normal range of motion in all extremities.  Neurologic:  Normal speech and language. No gross focal neurologic deficits Skin:  Skin is warm, dry and intact.  Psychiatric: Patient appears depressed, tearful.  Distraught.  ____________________________________________   INITIAL IMPRESSION / ASSESSMENT AND PLAN / ED COURSE  Pertinent labs & imaging results that were available during my care of the patient were reviewed by me and considered in my medical decision making (see chart for details).   Patient presents emergency department quite distraught crying loudly, saying she is going to kill her self.  Patient is able to calm down and speak to me states she found her mother dead in her house yesterday.  Ever since the patient has been extremely depressed is now having thoughts of killing herself by cutting her wrist.  Patient does admit to alcohol marijuana and cocaine use but denies any tonight.  Given the patient's suicidal ideation  we will place under IVC and have psychiatry evaluate.  Patient is quite distraught, we will dose IM Benadryl to help calm the patient.  Patient has no medical complaints tonight.  We will check labs as a precaution and await psychiatric disposition.  Patient has been seen by psychiatry.  They will be admitting to their service once a bed becomes available.  Maytal Mijangos Karapetyan was evaluated in Emergency Department on 05/11/2020 for the symptoms described in the history of present illness. She was evaluated in the context of the global COVID-19 pandemic, which necessitated consideration that the patient might be at risk for infection with the SARS-CoV-2 virus that causes COVID-19. Institutional protocols and algorithms that pertain to the evaluation of patients at risk for COVID-19 are in a state of rapid change based on information released by regulatory bodies including the CDC and federal and state organizations. These policies and algorithms were followed during the patient's care in the ED.  The patient  has been placed in psychiatric observation due to the need to provide a safe environment for the patient while obtaining psychiatric consultation and evaluation, as well as ongoing medical and medication management to treat the patient's condition.  The patient has been placed under full IVC at this time.   ____________________________________________   FINAL CLINICAL IMPRESSION(S) / ED DIAGNOSES  Depression Suicidal ideation Pregnancy   Harvest Dark, MD 05/12/20 0230

## 2020-05-11 NOTE — ED Triage Notes (Signed)
Pt presents to ED via POV with c/o feeling sad. Pt states "I found my mama dead in the floor yesterday, I don't know what to do". Pt denies SI/HI at this time. Pt states is also approx 6 months pregnant, states has not had prental care for her baby. Pt presents A&O x4, calm and cooperative. Pt states she is sad due to the loss of her baby.

## 2020-05-12 ENCOUNTER — Inpatient Hospital Stay: Admit: 2020-05-12 | Payer: Medicaid Other | Source: Intra-hospital | Admitting: Psychiatry

## 2020-05-12 DIAGNOSIS — F3164 Bipolar disorder, current episode mixed, severe, with psychotic features: Secondary | ICD-10-CM | POA: Insufficient documentation

## 2020-05-12 LAB — URINE DRUG SCREEN, QUALITATIVE (ARMC ONLY)
Amphetamines, Ur Screen: POSITIVE — AB
Barbiturates, Ur Screen: NOT DETECTED
Benzodiazepine, Ur Scrn: NOT DETECTED
Cannabinoid 50 Ng, Ur ~~LOC~~: POSITIVE — AB
Cocaine Metabolite,Ur ~~LOC~~: POSITIVE — AB
MDMA (Ecstasy)Ur Screen: NOT DETECTED
Methadone Scn, Ur: NOT DETECTED
Opiate, Ur Screen: NOT DETECTED
Phencyclidine (PCP) Ur S: NOT DETECTED
Tricyclic, Ur Screen: NOT DETECTED

## 2020-05-12 LAB — SARS CORONAVIRUS 2 BY RT PCR (HOSPITAL ORDER, PERFORMED IN ~~LOC~~ HOSPITAL LAB): SARS Coronavirus 2: POSITIVE — AB

## 2020-05-12 LAB — SALICYLATE LEVEL: Salicylate Lvl: <7 mg/dL — ABNORMAL LOW (ref 7.0–30.0)

## 2020-05-12 LAB — ACETAMINOPHEN LEVEL: Acetaminophen (Tylenol), Serum: <10 ug/mL — ABNORMAL LOW (ref 10–30)

## 2020-05-12 MED ORDER — OLANZAPINE 5 MG PO TABS
5.0000 mg | ORAL_TABLET | Freq: Every day | ORAL | Status: DC
  Administered 2020-05-12 – 2020-05-22 (×10): 5 mg via ORAL
  Filled 2020-05-12 (×12): qty 1

## 2020-05-12 MED ORDER — DIPHENHYDRAMINE HCL 25 MG PO CAPS
25.0000 mg | ORAL_CAPSULE | Freq: Once | ORAL | Status: AC
  Administered 2020-05-12: 25 mg via ORAL
  Filled 2020-05-12: qty 1

## 2020-05-12 MED ORDER — DIPHENHYDRAMINE HCL 50 MG/ML IJ SOLN
50.0000 mg | Freq: Once | INTRAMUSCULAR | Status: AC
  Administered 2020-05-12: 50 mg via INTRAMUSCULAR

## 2020-05-12 MED ORDER — DIPHENHYDRAMINE HCL 50 MG/ML IJ SOLN
INTRAMUSCULAR | Status: AC
  Administered 2020-05-12: 50 mg via INTRAMUSCULAR
  Filled 2020-05-12: qty 1

## 2020-05-12 MED ORDER — SERTRALINE HCL 50 MG PO TABS
25.0000 mg | ORAL_TABLET | Freq: Every day | ORAL | Status: DC
  Administered 2020-05-12: 25 mg via ORAL
  Filled 2020-05-12: qty 1

## 2020-05-12 NOTE — ED Notes (Signed)
Moaning and yelling heard coming from the room. Pt behavior is inconsistent. Pt repeating "oh my God." Checked on safety of staff.

## 2020-05-12 NOTE — ED Notes (Signed)
Pt yelling and moaning and speaking incoherently after a covid swab. This Designer, television/film set both ask pt what was wrong and what she needed. Pt continued behavior before yelling at staff and beating on the bedside table. Pt yelled for staff to leave her alone. Advised pt to stop yelling as if something was wrong causing concern for staff. Pt yells she isnt bothering anyone. This RN notifies pt that she is bothering many patients who are attempting to rest and recover at 2am. Pt response is "So what." Door closed and pt left alone.

## 2020-05-12 NOTE — ED Notes (Signed)
IVC/Consult Completed/ Pending Inpt Admit

## 2020-05-12 NOTE — ED Notes (Signed)
Pt still yelling and crying uncontrollably out loud. When pt is asked what she needs pt continues to yell and cry even louder. This tech remains as 1:1 Recruitment consultant.

## 2020-05-12 NOTE — ED Notes (Signed)
Pt continues to scream and cry out loud uncontrollably. This tech remains at bedside as 1:1 sitter.

## 2020-05-12 NOTE — ED Notes (Signed)
Pt given meal tray and apple juice to drink.

## 2020-05-12 NOTE — Consult Note (Signed)
[facility] Face-to-Face Psychiatry Consult   Reason for Consult: Depression Referring Physician: Dr. Lenard Lance Patient Identification: [name] MRN:  [id number] Principal Diagnosis: <principal problem not specified> Diagnosis:  Active Problems:   Self-inflicted laceration of wrist (HCC)   MDD (major depressive disorder)   Tobacco use disorder   Stimulant use disorder (HCC) (cocaine)   Alcohol use disorder, moderate, dependence (HCC)   Pregnant   Medically noncompliant   Cocaine use disorder, moderate, dependence (HCC)   Cannabis use disorder, moderate, dependence (HCC)   Abdominal pain   Thoughts of self harm   MDD (major depressive disorder), recurrent episode, severe (HCC)   Total Time spent with patient: 30 minutes  Subjective:  "I found my mom dead on the floor this morning.  I am 6 months pregnant." [name] is a 34 y.o. female patient presented to [facility] ED via POV and then placed under involuntary commitment status (IVC) by the EDP.  Per the triage nursing note, The patient's voice feeling sad. "I found my mama dead on the floor yesterday; I don't know what to do." The patient's UDS shows her positive for amphetamines, cocaine, and cannabinoid.  The patient reports feeling suicidal tonight with a plan to cut her wrist. The patient voiced she is currently six months pregnant but has never received prenatal care for her unborn child.  The patient expressed having five children, and currently, they are all in the custody of the Department of social services (DSS).   The patient was seen face-to-face by this provider; chart reviewed and consulted with Dr. Lenard Lance on 05/12/2020 due to the patient's care. It was discussed with the EDP that the patient does meet the criteria to be admitted to the psychiatric inpatient unit.  The patient is alert and oriented x 4, very emotional due to the recent death of her mom, but cooperative and mood-congruent with affect on  evaluation. The patient does not appear to be responding to internal or external stimuli. She is not presenting with delusional thinking. The patient denies auditory or visual hallucinations. The patient admits to suicidal ideation with a plan to cut her wrist but denies homicidal ideations. The patient is not presenting with any psychotic or paranoid behaviors. During an encounter with the patient, she could barely answer most of the questions raised to her.  Plan: The patient is a safety risk to self and does require psychiatric inpatient admission for stabilization and treatment.   HPI:  Per Dr. Lenard Lance: [name] is a 34 y.o. female with a past medical history of anxiety, depression, substance abuse, presents to the emergency department for suicidal ideation approximately 6 months pregnant.  According to the patient she is G7, P5 A1 who presents with suicidal ideation with a plan to cut her wrists to kill her self.  Patient states she found her mother dead at home yesterday and ever since has had thoughts of killing herself.  Patient is quite distraught in the emergency department crying loudly.  Patient is overall cooperative.  Patient does admit to recent cocaine marijuana and alcohol use but denies any tonight. Past Psychiatric History:   Anxiety Depression History of substance abuse (HCC) History of suicide attempt  Risk to Self: Suicidal Ideation: Yes-Currently Present Suicidal Intent: Yes-Currently Present Is patient at risk for suicide?: Yes Suicidal Plan?: Yes-Currently Present Specify Current Suicidal Plan: "Cut myself" Access to Means: Yes Specify Access to Suicidal Means: Access to sharp objects What has been your use of drugs/alcohol within the  last 12 months?: Marijuana and Alcohol How many times?: 4 Other Self Harm Risks: Cutting Triggers for Past Attempts: Unknown Intentional Self Injurious Behavior: Cutting Comment - Self Injurious Behavior: Patient has a  history of cutting Risk to Others: Homicidal Ideation: No Thoughts of Harm to Others: No Current Homicidal Intent: No Current Homicidal Plan: No Access to Homicidal Means: No Identified Victim: None History of harm to others?: No Assessment of Violence: None Noted Does patient have access to weapons?: No Criminal Charges Pending?: No Does patient have a court date: No Prior Inpatient Therapy: Prior Inpatient Therapy: Yes Prior Therapy Dates: January 2021 and prior Prior Therapy [facility] Reason for Treatment: Depression and  anxiety Prior Outpatient Therapy: Prior Outpatient Therapy: No Does patient have an ACCT team?: No Does patient have Intensive In-House Services?  : No Does patient have [facility] services? : No Does patient have P4CC services?: No  Past Medical History:  Past Medical History:  Diagnosis Date  . Anxiety   . Depression   . History of substance abuse (HCC)   . History of suicide attempt   . History of thyroid disease   . Hypertension   . Thyroid disease     Past Surgical History:  Procedure Laterality Date  . CESAREAN SECTION  2000, 2002, 2013   Family History:  Family History  Problem Relation Age of Onset  . Diabetes Mother    Family Psychiatric  History:  Social History:  Social History   Substance and Sexual Activity  Alcohol Use Not Currently   Comment: social drinker     Social History   Substance and Sexual Activity  Drug Use Not Currently  . Types: Cocaine, Marijuana   Comment: Positive UDS 12/2017    Social History   Socioeconomic History  . Marital status: Single    Spouse name: Not on file  . Number of children: Not on file  . Years of education: Not on file  . Highest education level: Not on file  Occupational History  . Not on file  Tobacco Use  . Smoking status: Current Every Day Smoker    Packs/day: 0.50  . Smokeless tobacco: Never Used  Substance and Sexual Activity  . Alcohol use: Not  Currently    Comment: social drinker  . Drug use: Not Currently    Types: Cocaine, Marijuana    Comment: Positive UDS 12/2017  . Sexual activity: Not Currently    Birth control/protection: None  Other Topics Concern  . Not on file  Social History Narrative  . Not on file   Social Determinants of Health   Financial Resource Strain:   . Difficulty of Paying Living Expenses:   Food Insecurity:   . Worried About Programme researcher, broadcasting/film/videounning Out of Food in the Last Year:   . Baristaan Out of Food in the Last Year:   Transportation Needs:   . Freight forwarderLack of Transportation (Medical):   Marland Kitchen. Lack of Transportation (Non-Medical):   Physical Activity:   . Days of Exercise per Week:   . Minutes of Exercise per Session:   Stress:   . Feeling of Stress :   Social Connections:   . Frequency of Communication with Friends and Family:   . Frequency of Social Gatherings with Friends and Family:   . Attends Religious Services:   . Active Member of Clubs or Organizations:   . Attends BankerClub or Organization Meetings:   Marland Kitchen. Marital Status:    Additional Social History:    Allergies:  Allergies  Allergen Reactions  . Aspirin Anaphylaxis, Diarrhea and Nausea And Vomiting  . Banana Anaphylaxis, Itching, Shortness Of Breath and Swelling  . Other Itching, Shortness Of Breath and Swelling  . Peanut Oil Itching, Shortness Of Breath and Swelling  . Peanut-Containing Drug Products Shortness Of Breath, Itching and Swelling  . Pecan Nut (Diagnostic) Anaphylaxis    Labs:  Results for orders placed or performed during the hospital encounter of 05/11/20 (from the past 48 hour(s))  CBC     Status: Abnormal   Collection Time: 05/11/20 11:21 PM  Result Value Ref Range   WBC 15.4 (H) 4.0 - 10.5 K/uL   RBC 3.83 (L) 3.87 - 5.11 MIL/uL   Hemoglobin 11.5 (L) 12.0 - 15.0 g/dL   HCT 33.4 (L) 36.0 - 46.0 %   MCV 87.2 80.0 - 100.0 fL   MCH 30.0 26.0 - 34.0 pg   MCHC 34.4 30.0 - 36.0 g/dL   RDW 13.7 11.5 - 15.5 %   Platelets 317 150 - 400 K/uL    nRBC 0.0 0.0 - 0.2 %    Comment: Performed at [facility] [address] [place], [place] [address]  Comprehensive metabolic panel     Status: Abnormal   Collection Time: 05/11/20 11:21 PM  Result Value Ref Range   Sodium 136 135 - 145 mmol/L   Potassium 3.6 3.5 - 5.1 mmol/L   Chloride 102 98 - 111 mmol/L   CO2 23 22 - 32 mmol/L   Glucose, Bld 74 70 - 99 mg/dL    Comment: Glucose reference range applies only to samples taken after fasting for at least 8 hours.   BUN 9 6 - 20 mg/dL   Creatinine, Ser 0.58 0.44 - 1.00 mg/dL   Calcium 8.8 (L) 8.9 - 10.3 mg/dL   Total Protein 7.5 6.5 - 8.1 g/dL   Albumin 3.5 3.5 - 5.0 g/dL   AST 18 15 - 41 U/L   ALT 13 0 - 44 U/L   Alkaline Phosphatase 72 38 - 126 U/L   Total Bilirubin 0.7 0.3 - 1.2 mg/dL   GFR calc non Af Amer >60 >60 mL/min   GFR calc Af Amer >60 >60 mL/min   Anion gap 11 5 - 15    Comment: Performed at [facility], [address] [place], [place] [address]  Ethanol     Status: None   Collection Time: 05/11/20 11:21 PM  Result Value Ref Range   Alcohol, Ethyl (B) <10 <10 mg/dL    Comment: (NOTE) Lowest detectable limit for serum alcohol is 10 mg/dL. For medical purposes only. Performed at [facility], [address] [place], [place] [address]   Salicylate level     Status: Abnormal   Collection Time: 05/11/20 11:21 PM  Result Value Ref Range   Salicylate Lvl <1.5 (L) 7.0 - 30.0 mg/dL    Comment: Performed at [facility], [address] [place], [place] [address]  Acetaminophen level     Status: Abnormal   Collection Time: 05/11/20 11:21 PM  Result Value Ref Range   Acetaminophen (Tylenol), Serum <10 (L) 10 - 30 ug/mL    Comment: (NOTE) Therapeutic concentrations vary significantly. A range of 10-30 ug/mL  may be an effective concentration for many patients. However, some  are best treated at concentrations outside of this range. Acetaminophen concentrations >150 ug/mL at  4 hours after ingestion  and >50 ug/mL at 12 hours after ingestion are often associated with  toxic reactions. Performed at [facility], [address] [place], [place] [address]   Urine Drug Screen, [facility] only)     Status: Abnormal   Collection Time: 05/12/20  2:23 AM  Result Value Ref Range   Tricyclic, Ur Screen NONE DETECTED NONE DETECTED   Amphetamines, Ur Screen POSITIVE (A) NONE DETECTED   MDMA (Ecstasy)Ur Screen NONE DETECTED NONE DETECTED   Cocaine Metabolite,Ur [place] POSITIVE (A) NONE DETECTED   Opiate, Ur Screen NONE DETECTED NONE DETECTED   Phencyclidine (PCP) Ur S NONE DETECTED NONE DETECTED   Cannabinoid 50 Ng, Ur [place] POSITIVE (A) NONE DETECTED   Barbiturates, Ur Screen NONE DETECTED NONE DETECTED   Benzodiazepine, Ur Scrn NONE DETECTED NONE DETECTED   Methadone Scn, Ur NONE DETECTED NONE DETECTED    Comment: (NOTE) Tricyclics + metabolites, urine    Cutoff 1000 ng/mL Amphetamines + metabolites, urine  Cutoff 1000 ng/mL MDMA (Ecstasy), urine              Cutoff 500 ng/mL Cocaine Metabolite, urine          Cutoff 300 ng/mL Opiate + metabolites, urine        Cutoff 300 ng/mL Phencyclidine (PCP), urine         Cutoff 25 ng/mL Cannabinoid, urine                 Cutoff 50 ng/mL Barbiturates + metabolites, urine  Cutoff 200 ng/mL Benzodiazepine, urine              Cutoff 200 ng/mL Methadone, urine                   Cutoff 300 ng/mL The urine drug screen provides only a preliminary, unconfirmed analytical test result and should not be used for non-medical purposes. Clinical consideration and professional judgment should be applied to any positive drug screen result due to possible interfering substances. A more specific alternate chemical method must be used in order to obtain a confirmed analytical result. Gas chromatography / mass spectrometry (GC/MS) is the preferred confirmat ory method. Performed at [facility], [address]  [place], [place] [address]     No current facility-administered medications for this encounter.   Current Outpatient Medications  Medication Sig Dispense Refill  . FLUoxetine (PROZAC) 20 MG capsule Take 1 capsule (20 mg total) by mouth daily. 30 capsule 1  . fluticasone (FLONASE) 50 MCG/ACT nasal spray Place 2 sprays into Mejia nostrils daily. 18.2 mL 1  . hydrOXYzine (ATARAX/VISTARIL) 50 MG tablet Take 2 tablets (100 mg total) by mouth at bedtime as needed (insomnia). 30 tablet 0  . hydrOXYzine (ATARAX/VISTARIL) 50 MG tablet Take 1 tablet (50 mg total) by mouth 2 (two) times daily as needed for anxiety. 30 tablet 1  . Prenatal Vit-Fe Fumarate-FA (PRENATAL MULTIVITAMIN) TABS tablet Take 1 tablet by mouth daily at 12 noon. 30 tablet 1  . pyridOXINE (VITAMIN B-6) 25 MG tablet Take 1 tablet (25 mg total) by mouth daily. 30 tablet 1    Musculoskeletal: Strength & Muscle Tone: within normal limits Gait & Station: normal Patient leans: N/A  Psychiatric Specialty Exam: Physical Exam  Nursing note and vitals reviewed. Constitutional: She is oriented to person, place, and time.  Musculoskeletal:        General: Normal range of motion.     Cervical back: Normal range of motion and neck supple.  Neurological: She is alert and oriented to person, place, and time.    Review  of Systems  Psychiatric/Behavioral: Positive for agitation and self-injury. The patient is nervous/anxious.   All other systems reviewed and are negative.   Blood pressure (!) 178/77, pulse 85, temperature 98 F (36.7 C), temperature source Oral, resp. rate (!) 22, height 4\' 11"  (1.499 m), weight 73.5 kg, last menstrual period 12/04/2019, SpO2 98 %.Body mass index is 32.72 kg/m.  General Appearance: Disheveled and Guarded  Eye Contact:  Absent  Speech:  Blocked, Clear and Coherent and Garbled  Volume:  Decreased  Mood:  Depressed, Hopeless and Irritable  Affect:  Depressed, Inappropriate, Full Range and Tearful  Thought  Process:  Coherent  Orientation:  Full (Time, Place, and Person)  Thought Content:  Logical and Rumination  Suicidal Thoughts:  Yes.  with intent/plan  Homicidal Thoughts:  No  Memory:  Immediate;   Fair Recent;   Fair Remote;   Fair  Judgement:  Impaired  Insight:  Lacking  Psychomotor Activity:  Decreased  Concentration:  Concentration: Poor and Attention Span: Poor  Recall:  14/09/2019 of Knowledge:  Fair  Language:  Fair  Akathisia:  Negative  Handed:  Right  AIMS (if indicated):     Assets:  Communication Skills Desire for Improvement Financial Resources/Insurance Physical Health Resilience Social Support  ADL's:  Intact  Cognition:  WNL  Sleep:        Treatment Plan Summary: Medication management and Plan Patient meets criteria for psychiatric inpatient admission.  Disposition: Recommend psychiatric Inpatient admission when medically cleared. Supportive therapy provided about ongoing stressors.  [employer], NP 05/12/2020 3:08 AM

## 2020-05-12 NOTE — ED Notes (Signed)
Pt requesting more food and soda.

## 2020-05-12 NOTE — BH Assessment (Signed)
Assessment Note  [name] is an 34 y.o. female presenting to [facility] ED initially voluntary but has since been IVC'd. Per triage note Pt presents to ED via POV with c/o feeling sad. Pt states "I found my mama dead in the floor yesterday, I don't know what to do". Pt denies SI/HI at this time. Pt states is also approx 6 months pregnant, states has not had prental care for her baby. Pt presents A&O x4, calm and cooperative. Pt states she is sad due to the loss of her baby. During assessment patient is alert and oriented x4, calm and cooperative, appears depressed, sad and tearful. Patient reported "I'm currently pregnant and my mom just died." Patient reported that she was living with her mother and recently found her dead on the floor. Patient reports she is currently pregnant with her 6th child, she reports having other children that are currently in DSS custody. Patient also reports the death of a past child. Patient reports lack of appetite and sleep and currently suicidal with a plan "to cut myself." Patient reports SI denies HI/AH/VH and does not appear to be responding to any internal or external stimuli. Patient does have a history of substance abuse and reports recently marijuana and alcohol, no current UDS available and BAL is negative.  Per Psyc NP patient recommended for Inpatient Hospitalization   Diagnosis: Depression, History of substance abuse  Past Medical History:  Past Medical History:  Diagnosis Date  . Anxiety   . Depression   . History of substance abuse (HCC)   . History of suicide attempt   . History of thyroid disease   . Hypertension   . Thyroid disease     Past Surgical History:  Procedure Laterality Date  . CESAREAN SECTION  2000, 2002, 2013    Family History:  Family History  Problem Relation Age of Onset  . Diabetes Mother     Social History:  reports that she has been smoking. She has been smoking about 0.50 packs per day. She has never used  smokeless tobacco. She reports previous alcohol use. She reports previous drug use. Drugs: Cocaine and Marijuana.  Additional Social History:  Alcohol / Drug Use Pain Medications: See MAR Prescriptions: See MAR Over the Counter: See MAR History of alcohol / drug use?: Yes Substance #1 Name of Substance 1: Marijuana Substance #2 Name of Substance 2: Alcohol  CIWA: CIWA-Ar BP: (!) 178/77 Pulse Rate: 85 COWS:    Allergies:  Allergies  Allergen Reactions  . Aspirin Anaphylaxis, Diarrhea and Nausea And Vomiting  . Banana Anaphylaxis, Itching, Shortness Of Breath and Swelling  . Other Itching, Shortness Of Breath and Swelling  . Peanut Oil Itching, Shortness Of Breath and Swelling  . Peanut-Containing Drug Products Shortness Of Breath, Itching and Swelling  . Pecan Nut (Diagnostic) Anaphylaxis    Home Medications: (Not in a hospital admission)   OB/GYN Status:  Patient's last menstrual period was 12/04/2019 (within months).  General Assessment Data Location of Assessment: [facility] ED TTS Assessment: In system Is this a Tele or Face-to-Face Assessment?: Face-to-Face Is this an Initial Assessment or a Re-assessment for this encounter?: Initial Assessment Patient Accompanied by:: N/A Language Other than English: No Living Arrangements: Other (Comment) What gender do you identify as?: Female Marital status: Single Pregnancy Status: Yes (Comment: include estimated delivery date) Living Arrangements: Alone Can pt return to current living arrangement?: Yes Admission Status: Involuntary Petitioner: ED Attending Is patient capable of signing voluntary admission?: No Referral Source:  Self/Family/Friend Insurance type: Medicaid  Medical Screening Exam ([place]) Medical Exam completed: Yes  Crisis Care Plan Living Arrangements: Alone Legal Guardian: Other:(Self) Name of Psychiatrist: None Name of Therapist: None  Education Status Is patient currently in school?:  No Is the patient employed, unemployed or receiving disability?: Unemployed  Risk to self with the past 6 months Suicidal Ideation: Yes-Currently Present Has patient been a risk to self within the past 6 months prior to admission? : Yes Suicidal Intent: Yes-Currently Present Has patient had any suicidal intent within the past 6 months prior to admission? : Yes Is patient at risk for suicide?: Yes Suicidal Plan?: Yes-Currently Present Has patient had any suicidal plan within the past 6 months prior to admission? : Yes Specify Current Suicidal Plan: "Cut myself" Access to Means: Yes Specify Access to Suicidal Means: Access to sharp objects What has been your use of drugs/alcohol within the last 12 months?: Marijuana and Alcohol Previous Attempts/Gestures: Yes How many times?: 4 Other Self Harm Risks: Cutting Triggers for Past Attempts: Unknown Intentional Self Injurious Behavior: Cutting Comment - Self Injurious Behavior: Patient has a history of cutting Family Suicide History: Unknown Recent stressful life event(s): Other (Comment)(Social Services, Mom recently passed away) Persecutory voices/beliefs?: No Depression: Yes Depression Symptoms: Tearfulness, Isolating, Loss of interest in usual pleasures, Feeling worthless/self pity Substance abuse history and/or treatment for substance abuse?: Yes Suicide prevention information given to non-admitted patients: Not applicable  Risk to Others within the past 6 months Homicidal Ideation: No Does patient have any lifetime risk of violence toward others beyond the six months prior to admission? : No Thoughts of Harm to Others: No Current Homicidal Intent: No Current Homicidal Plan: No Access to Homicidal Means: No Identified Victim: None History of harm to others?: No Assessment of Violence: None Noted Does patient have access to weapons?: No Criminal Charges Pending?: No Does patient have a court date: No Is patient on probation?:  No  Psychosis Hallucinations: None noted Delusions: None noted  Mental Status Report Appearance/Hygiene: In scrubs Eye Contact: Fair Motor Activity: Freedom of movement Speech: Logical/coherent Level of Consciousness: Alert Mood: Depressed, Sad Affect: Depressed Anxiety Level: Minimal Thought Processes: Coherent Judgement: Unimpaired Orientation: Person, Place, Time, Situation, Appropriate for developmental age Obsessive Compulsive Thoughts/Behaviors: None  Cognitive Functioning Concentration: Normal Memory: Recent Intact, Remote Intact Is patient IDD: No Insight: Fair Impulse Control: Fair Appetite: Poor Have you had any weight changes? : No Change Sleep: Decreased Total Hours of Sleep: 0 Vegetative Symptoms: None  ADLScreening [facility] Assessment Services) Patient's cognitive ability adequate to safely complete daily activities?: Yes Patient able to express need for assistance with ADLs?: Yes Independently performs ADLs?: Yes (appropriate for developmental age)  Prior Inpatient Therapy Prior Inpatient Therapy: Yes Prior Therapy Dates: January 2021 and prior Prior Therapy Facilty/Provider(s): [facility], [facility] Reason for Treatment: Depression and  anxiety  Prior Outpatient Therapy Prior Outpatient Therapy: No Does patient have an ACCT team?: No Does patient have Intensive In-House Services?  : No Does patient have [facility] services? : No Does patient have P4CC services?: No  ADL Screening (condition at time of admission) Patient's cognitive ability adequate to safely complete daily activities?: Yes Is the patient deaf or have difficulty hearing?: No Does the patient have difficulty seeing, even when wearing glasses/contacts?: No Does the patient have difficulty concentrating, remembering, or making decisions?: No Patient able to express need for assistance with ADLs?: Yes Does the patient have difficulty dressing or bathing?: No Independently performs ADLs?: Yes  (  appropriate for developmental age) Does the patient have difficulty walking or climbing stairs?: No Weakness of Legs: None Weakness of Arms/Hands: None  Home Assistive Devices/Equipment Home Assistive Devices/Equipment: None  Therapy Consults (therapy consults require a physician order) PT Evaluation Needed: No OT Evalulation Needed: No SLP Evaluation Needed: No Abuse/Neglect Assessment (Assessment to be complete while patient is alone) Abuse/Neglect Assessment Can Be Completed: Yes Physical Abuse: Denies Verbal Abuse: Denies Sexual Abuse: Denies Exploitation of patient/patient's resources: Denies Self-Neglect: Denies Values / Beliefs Cultural Requests During Hospitalization: None Spiritual Requests During Hospitalization: None Consults Spiritual Care Consult Needed: No Transition of Care Team Consult Needed: No Advance Directives (For Healthcare) Does Patient Have a Medical Advance Directive?: No Would patient like information on creating a medical advance directive?: No - Patient declined          Disposition: Per Psyc NP patient recommended for Inpatient Hospitalization Disposition Initial Assessment Completed for this Encounter: Yes  On Site Evaluation by:   Reviewed with Physician:    Benay Pike 05/12/2020 2:13 AM

## 2020-05-12 NOTE — ED Notes (Signed)
Pt continuously yelling and crying out loud uncontrollably, Sherie, RN notified. This tech remains as 1:1 Recruitment consultant.

## 2020-05-12 NOTE — BH Assessment (Addendum)
PATIENT BED AVAILABLE PENDING NEGATIVE COVID RESULTS AND UDS AFTER 9:30AM ON 05/12/20  Patient is to be admitted to [facility] by Psychiatric Nurse Practitioner Gillermo Murdoch.  Attending Physician will be Dr. Toni Amend.   Patient has been assigned to room 302, by [facility] Charge Nurse Marylu Lund.    ER staff is aware of the admission:  [facility] ER Secretary    Dr. Ovidio Kin, ER MD   Toma Copier Patient's Nurse   Tresa Endo Patient Access.

## 2020-05-12 NOTE — ED Notes (Signed)
IVC/Psych Consult ordered/RN & Security aware

## 2020-05-12 NOTE — ED Notes (Signed)
Pt given meal tray at this time. Water to drink. Pt expresses no further needs at this time. Sitter remains at bedside,

## 2020-05-12 NOTE — Final Progress Note (Signed)
[name] MD Psychiatry Note Progress [name]  05/12/20   Asked to follow up on this complex 34  Year old AA female with history of bipolar disorder mixed with psychosis, substance dependence including cocaine, marijuana amphetamines  ---now at least six months positive who has tested positive for COVID   She is in ER in semi isolation with sitter.  She is on an IVC after wanting to harm self by cutting her wrists due to adjustment issues, life problems and alleged bereavement where she said she saw her mother dead on the floor.  We have not been able to verify her story.    She reports mood swings, ups and downs, lability, highs and lows, lack of sleep, intrusive negative thoughts speeded thoughts pressured speech mixed with possible voices paranoia, fear --   She has generalized anxiety --with excessive worry, nervousness tension frustration--fear dread and doom, panic symptoms ---frozen numb feelings  It is not clear if she has --PTSD   She claims bereavement but it is not clear if  Her Mom actually passed away    MS exam  Angry edgy frustrated female  Oriented to person place date and time  Rapport poor ---vague answers not very cooperative   Speech loud pressured,   Curt   Mood irritable angry  Affect flat   Sits/and lies down in dark room   Thought content possibly delusional paranoid  Process --illogical disorganized vague   Memory remote recent and immediate intact through general [employer] of knowledge and intelligence below average --  Judgement insight reliability   --poor   Abstraction not cooperative   Movement issues ---none   SI and HI --actively says she depressed and wants to harm self   IVC continues    Diagnosis --bipolar disorder mixed with psychosis   Generalized anxiety   Adjustment disorder   Cocaine/ methamphetamine dependence     A/P remains inpatient and may need to stay in ED --due to covid and pregnancy  and difficulty with transfer   Zyprexa and Zoloft started post consent and brief psychoeducation   OB GYN consult written for high risk category

## 2020-05-12 NOTE — ED Notes (Signed)
TTS notified this RN about a UDS. This RN Set designer.

## 2020-05-12 NOTE — BH Assessment (Signed)
Patient unable to be transferred to BMU due to being COVID positive.

## 2020-05-13 MED ORDER — SERTRALINE HCL 50 MG PO TABS
25.0000 mg | ORAL_TABLET | Freq: Every day | ORAL | Status: DC
  Administered 2020-05-13 – 2020-05-17 (×5): 25 mg via ORAL
  Filled 2020-05-13 (×6): qty 1

## 2020-05-13 MED ORDER — ACETAMINOPHEN 500 MG PO TABS
1000.0000 mg | ORAL_TABLET | Freq: Once | ORAL | Status: AC
  Administered 2020-05-13: 1000 mg via ORAL
  Filled 2020-05-13: qty 2

## 2020-05-13 NOTE — ED Notes (Signed)
Dinner tray given patient. Sitter at bedside.

## 2020-05-13 NOTE — ED Notes (Signed)
IVC PENDING  CONSULT ?

## 2020-05-13 NOTE — ED Notes (Signed)
Brought bath supplies to tech and pt as well as fresh wine colored scrubs and underwear and socks. PT states tylenol helped her headache.

## 2020-05-13 NOTE — ED Notes (Signed)
Fetal heart tones assessed at 158 BPM

## 2020-05-13 NOTE — ED Notes (Signed)
Breakfast given to pt. Pt continues to sleep. Sitter at bedside.

## 2020-05-13 NOTE — ED Notes (Signed)
Pt is very upset about not having a room by herself and not be able to take a shower for the past 3 days. This tech offered supplies for sponge bath. Pt said, " I have been here before with a room by myself. Why can't I be admitted to the hospital? I need a shower, this is not a ladylike." Once again, this tech offered her a sponge bath. Pt is crying. This tech explains due to her having covid there is not a room adequate for her to be "downstairs" by herself. Pt asked this tech, "What will happens if I leave this place?" This tech explains that at this moment she has IVC papers and we prefer for her to stay for further behavioral health evaluation. Pt is also upset for not being able to order her own food. This tech will inform nurse about it.

## 2020-05-13 NOTE — ED Notes (Signed)
Lunch tray given to patient with 2 cups of apple juice. Sitter at bedside.

## 2020-05-14 NOTE — ED Notes (Signed)
This RN called dietary and ordered food for the patient per request.

## 2020-05-14 NOTE — ED Notes (Signed)
Pt upset for not being able to order her own food and states she will not eat the breakfast that is brought to her. This tech will inform the nurse about it.

## 2020-05-14 NOTE — ED Notes (Signed)
Pt given breakfast food tray. Sitter at bedside.

## 2020-05-14 NOTE — ED Notes (Signed)
This tech has placed a breakfast and lunch order for pt.

## 2020-05-15 MED ORDER — ACETAMINOPHEN 500 MG PO TABS
1000.0000 mg | ORAL_TABLET | Freq: Once | ORAL | Status: AC
  Administered 2020-05-15: 1000 mg via ORAL
  Filled 2020-05-15: qty 2

## 2020-05-15 NOTE — ED Notes (Signed)
Pt unhappy with care pt began throwing things around the room and slammed table to the wall which then fell over. Pt then voices her complaints to this EDT. Pt yelling at EDT.

## 2020-05-15 NOTE — ED Notes (Signed)
Provided pt w/ new scrub top and underwear.

## 2020-05-15 NOTE — ED Notes (Signed)
Lunch tray was provided to pt.

## 2020-05-15 NOTE — ED Notes (Signed)
Pt pleasant at bedside at this time. Pt engaging in conversation about movie on TV.

## 2020-05-15 NOTE — ED Notes (Signed)
Pt was upset for having to eat cold breakfast at 11:40am , pt was asleep before this time. This tech placed lunch order for pt.

## 2020-05-15 NOTE — ED Notes (Signed)
Pt asked for a new scrub top due to spilling food on it, pt changed into new top and new underwear.

## 2020-05-15 NOTE — ED Notes (Signed)
Pt unsatisfied w/ dinner, brought requested condiments to appease pt. Asked permission to give PM meds early to help ease agitation.

## 2020-05-15 NOTE — ED Notes (Signed)
After eating dinner, pt started crying again.

## 2020-05-15 NOTE — ED Notes (Addendum)
Pt became extremely agitated at shift change. Pt's food order was misplaced and had to be remade. Pt c/o HA and asked for tylenol. Spoke w/ EDP and tylenol ordered.

## 2020-05-15 NOTE — ED Notes (Addendum)
Per pt request to talk to son. Pt on the phone with a family member to speak with her son. Pt is very upset for family members not letting her speak to her son. Pt stated, "they wont let me speak to my son. They don't know what I have been through or half of the s* I've been throught . I found my mom dead." pt continues to "express herself," as she mentions. This tech notified Mitch, Charity fundraiser.

## 2020-05-15 NOTE — ED Notes (Addendum)
Pt given dinner tray. Pt ate 75%.

## 2020-05-15 NOTE — ED Notes (Addendum)
Pt extremely upset about not being able to have a spoon to eat her food with. Pt throwing things at this Tech and yelling to "get out of here". Pt crying, saying she is "being punished". Cup of gravy was thrown toward this Tech and splattered on the wall.

## 2020-05-16 MED ORDER — ALUM & MAG HYDROXIDE-SIMETH 200-200-20 MG/5ML PO SUSP
30.0000 mL | Freq: Once | ORAL | Status: AC
  Administered 2020-05-17: 30 mL via ORAL
  Filled 2020-05-16: qty 30

## 2020-05-16 NOTE — ED Notes (Signed)
This nurse introduces self to pt. Pt calm and cooperative, pt eating tray at this time and request strawberry sherbet, provided by this nurse. Sitter, Trich, Tech remains with pt.

## 2020-05-16 NOTE — ED Notes (Signed)
Pt to decon room to take shower. Pt had not showered since arrival. Pt states she feels much better and refreshed following shower. Back to room at this time with sitter present

## 2020-05-16 NOTE — ED Notes (Signed)
Pt given meal tray.

## 2020-05-16 NOTE — ED Notes (Signed)
Pt sleeping since 10 pm yesterday with occasional stirring.

## 2020-05-16 NOTE — ED Notes (Signed)
This tech assigned as safety sitter  

## 2020-05-16 NOTE — ED Notes (Signed)
Pt sleeping, dinner tray placed at bedside. Pt did not want to wake up to eat.

## 2020-05-16 NOTE — ED Notes (Signed)
RN checked to make sure pt received meal tray for breakfast. [name] states that pt received food but refused to eat it.

## 2020-05-16 NOTE — ED Notes (Signed)
Dinner order placed with [employer]

## 2020-05-16 NOTE — ED Notes (Signed)
Pt awake and eating dinner. Pt is in a good mood despite the bread being the wrong type, she wanted a sub roll. Pt will continue to be monitored 1:1

## 2020-05-16 NOTE — ED Notes (Signed)
Pt given cereal, push ice cream, 3 saltine crackers, 2 packets of sugar and a carton of milk.

## 2020-05-16 NOTE — ED Notes (Signed)
Pt given a cola.

## 2020-05-17 MED ORDER — ACETAMINOPHEN 500 MG PO TABS
1000.0000 mg | ORAL_TABLET | Freq: Once | ORAL | Status: AC
  Administered 2020-05-17: 1000 mg via ORAL
  Filled 2020-05-17: qty 2

## 2020-05-17 MED ORDER — ACETAMINOPHEN 500 MG PO TABS: 1000.0000 mg | ORAL_TABLET | Freq: Once | ORAL | Status: AC

## 2020-05-17 MED ORDER — ACETAMINOPHEN 500 MG PO TABS
ORAL_TABLET | ORAL | Status: AC
  Administered 2020-05-17: 1000 mg via ORAL
  Filled 2020-05-17: qty 2

## 2020-05-17 NOTE — ED Notes (Signed)
Pt complaining of indigestion at this time. MD notified and orders to follow.

## 2020-05-17 NOTE — ED Notes (Signed)
Pt becoming increasingly agitated repeatedly saying "I want to go home, I'm not suicidal anymore, that was five days ago. Y'all ain't doing nothing for me, you're making it worse". Psychiatrist notified and asked to speak with patient.

## 2020-05-17 NOTE — ED Notes (Addendum)
Pt currently awake. This tech sitting 1:1 with pt.

## 2020-05-17 NOTE — ED Notes (Signed)
Pt sleeping sitter at bedside. 

## 2020-05-17 NOTE — ED Notes (Signed)
Gave pt some frosted flakes with milk. Pt is watching TV in bed.

## 2020-05-17 NOTE — ED Notes (Signed)
Pt given lunch tray and coke to drink. Pt given saltine crackers as well. Pt calm at this time.

## 2020-05-18 MED ORDER — ACETAMINOPHEN 500 MG PO TABS
1000.0000 mg | ORAL_TABLET | Freq: Once | ORAL | Status: AC
  Administered 2020-05-18: 1000 mg via ORAL
  Filled 2020-05-18: qty 2

## 2020-05-18 MED ORDER — SERTRALINE HCL 50 MG PO TABS
50.0000 mg | ORAL_TABLET | Freq: Every day | ORAL | Status: DC
  Administered 2020-05-18 – 2020-05-20 (×3): 50 mg via ORAL
  Filled 2020-05-18 (×4): qty 1

## 2020-05-18 NOTE — Progress Notes (Signed)
[facility] MD Progress Note  05/18/2020 1:04 PM [name]  MRN:  [id number] Subjective:  I need a regular diet I am still depressed and suicidal   Principal Problem: <principal problem not specified>   Bipolar disorder on IVC, actively wants to harm self, course waxes and wanes --6 months pregnant no home support --COVID positive remains admitted in our ER with med mgt and partial support --awaits OB GYN consult and formal SW consult due to lack of supports in community CPS has her five kids and she has no support for current pregnancy or substance related program to go to    Diagnosis: Active Problems:   Self-inflicted laceration of wrist (HCC)   MDD (major depressive disorder)   Tobacco use disorder   Stimulant use disorder (HCC) (cocaine)   Alcohol use disorder, moderate, dependence ([place])   Pregnant   Medically noncompliant   Cocaine use disorder, moderate, dependence (HCC)   Cannabis use disorder, moderate, dependence (HCC)   Abdominal pain   Thoughts of self harm   MDD (major depressive disorder), recurrent episode, severe ([place])  Probable bipolar disorder mixed with psychosis   Total Time spent with patient:   40-60 min    Past Psychiatric History: already discussed no recent psych follow up care or interventions ---no recent drug rehab recovery AA NA and related programming   Past Medical History:  Past Medical History:  Diagnosis Date  . Anxiety   . Depression   . History of substance abuse ([place])   . History of suicide attempt   . History of thyroid disease   . Hypertension   . Thyroid disease     Past Surgical History:  Procedure Laterality Date  . CESAREAN SECTION  2000, 2002, 2013   Family History:  Family History  Problem Relation Age of Onset  . Diabetes Mother    Family Psychiatric  History:  Dad and mom with history of major depression  Social History:  Social History   Substance and Sexual Activity  Alcohol Use Not Currently   Comment:  social drinker     Social History   Substance and Sexual Activity  Drug Use Not Currently  . Types: Cocaine, Marijuana   Comment: Positive UDS 12/2017    Social History   Socioeconomic History  . Marital status: Single    Spouse name: Not on file  . Number of children: Not on file  . Years of education: Not on file  . Highest education level: Not on file  Occupational History  . Not on file  Tobacco Use  . Smoking status: Current Every Day Smoker    Packs/day: 0.50  . Smokeless tobacco: Never Used  Substance and Sexual Activity  . Alcohol use: Not Currently    Comment: social drinker  . Drug use: Not Currently    Types: Cocaine, Marijuana    Comment: Positive UDS 12/2017  . Sexual activity: Not Currently    Birth control/protection: None  Other Topics Concern  . Not on file  Social History Narrative  . Not on file   Social Determinants of Health   Financial Resource Strain:   . Difficulty of Paying Living Expenses:   Food Insecurity:   . Worried About Charity fundraiser in the Last Year:   . Arboriculturist in the Last Year:   Transportation Needs:   . Film/video editor (Medical):   Marland Kitchen Lack of Transportation (Non-Medical):   Physical Activity:   . Days of  Exercise per Week:   . Minutes of Exercise per Session:   Stress:   . Feeling of Stress :   Social Connections:   . Frequency of Communication with Friends and Family:   . Frequency of Social Gatherings with Friends and Family:   . Attends Religious Services:   . Active Member of Clubs or Organizations:   . Attends Banker Meetings:   Marland Kitchen Marital Status:    Additional Social History:    Pain Medications: See MAR Prescriptions: See MAR Over the Counter: See MAR History of alcohol / drug use?: Yes Name of Substance 1: Marijuana Name of Substance 2: Alcohol                Sleep: slightly improved with meds so far Appetite:   Increasing seeks regular diet now   Current  Medications: Current Facility-Administered Medications  Medication Dose Route Frequency Provider Last Rate Last Admin  . OLANZapine (ZYPREXA) tablet 5 mg  5 mg Oral QHS Roselind Messier, MD   5 mg at 05/17/20 2110  . sertraline (ZOLOFT) tablet 50 mg  50 mg Oral Daily Roselind Messier, MD       Current Outpatient Medications  Medication Sig Dispense Refill  . FLUoxetine (PROZAC) 20 MG capsule Take 1 capsule (20 mg total) by mouth daily. 30 capsule 1  . fluticasone (FLONASE) 50 MCG/ACT nasal spray Place 2 sprays into both nostrils daily. 18.2 mL 1  . hydrOXYzine (ATARAX/VISTARIL) 50 MG tablet Take 2 tablets (100 mg total) by mouth at bedtime as needed (insomnia). 30 tablet 0  . hydrOXYzine (ATARAX/VISTARIL) 50 MG tablet Take 1 tablet (50 mg total) by mouth 2 (two) times daily as needed for anxiety. 30 tablet 1  . Prenatal Vit-Fe Fumarate-FA (PRENATAL MULTIVITAMIN) TABS tablet Take 1 tablet by mouth daily at 12 noon. 30 tablet 1  . pyridOXINE (VITAMIN B-6) 25 MG tablet Take 1 tablet (25 mg total) by mouth daily. 30 tablet 1    Lab Results: No results found for this or any previous visit (from the past 48 hour(s)).  Blood Alcohol level:  Lab Results  Component Value Date   ETH <10 05/11/2020   ETH 26 (H) 04/07/2020    Metabolic Disorder Labs: Lab Results  Component Value Date   HGBA1C 5.1 01/02/2018   MPG 99.67 01/02/2018   No results found for: PROLACTIN Lab Results  Component Value Date   CHOL 218 (H) 01/02/2018   TRIG 187 (H) 01/02/2018   HDL 81 01/02/2018   CHOLHDL 2.7 01/02/2018   VLDL 37 01/02/2018   LDLCALC 100 (H) 01/02/2018   LDLCALC 82 01/12/2016    Physical Findings: AIMS:  , ,  ,  ,    CIWA:    COWS:     Musculoskeletal: Strength & Muscle Tone:  None for now Gait & Station: ammbulatory in general  Patient leans:  N/a  Psychiatric Specialty Exam: Physical Exam  Review of Systems  Blood pressure (!) 119/51, pulse 66, temperature 97.8 F (36.6 C),  temperature source Oral, resp. rate 16, height 4\' 11"  (1.499 m), weight 73.5 kg, last menstrual period 12/04/2019, SpO2 100 %.Body mass index is 32.72 kg/m.    Mental Status   Appearance ---haggard, tired forlorn ----speech somewhat loud desperate and pressured ----mood and affect ---depressed and anxious --movements --restless ----attention and concentration --distracted and upset -----thought process and content ---at times slightly disorganized, preoccupied with victim hood, SI and possible plans and no one at home to help -----  judgement insight reliability all poor --memory remote recent and immediate intact through general questions ---fund of knowledge and intelligence all poor ----SI and HI ---no HI or harm to others but still with Si and possible plans --no clear safety margin discharging at this time would not foster better safety or support  Abstraction--somewhat concrete --- Consciousness not clouded or fluctuant                                                           Treatment Plan Summary:  Zoloft increased to 50 mg daily  Zyprexa remains at 5 mg at HS for now  Awaits med effects   No side effects or new medical problems   Continues to be followed by psych and ER --awaits bed but hard due to COVID status and pregnancy status.   IVC renewed today   ER MD informed of progress   Roselind Messier, MD 05/18/2020, 1:04 PM

## 2020-05-18 NOTE — ED Notes (Signed)
Gave food tray with juice. 

## 2020-05-18 NOTE — ED Notes (Signed)
Pt allowed to order her lunch.

## 2020-05-18 NOTE — ED Notes (Signed)
PT  PLACED  UNDER  2ND SET OF IVC  PAPERS  PENDING  PLACEMENT 

## 2020-05-18 NOTE — ED Notes (Signed)
Pt has taken a shower.  

## 2020-05-18 NOTE — ED Notes (Signed)
Pt upset the breakfast tray "is only that biscuit with egg on it."   RN will attempt to get patient more to eat.

## 2020-05-18 NOTE — TOC Initial Note (Addendum)
Transition of Care [facility]) - Initial/Assessment Note    Patient Details  Name: [name] MRN: [id number] Date of Birth: [birth year]  [facility] CM/SW Contact:    Bolckow Cellar, RN Phone Number: 05/18/2020, 4:10 PM  Clinical Narrative:                 Left VM for [place] DSS in order to file CPS report.   Completed CPA report with Iantha Fallen @ [place] [facility].       Patient Goals and CMS Choice        Expected Discharge Plan and Services                                                Prior Living Arrangements/Services                       Activities of Daily Living Home Assistive Devices/Equipment: None ADL Screening (condition at time of admission) Patient's cognitive ability adequate to safely complete daily activities?: Yes Is the patient deaf or have difficulty hearing?: No Does the patient have difficulty seeing, even when wearing glasses/contacts?: No Does the patient have difficulty concentrating, remembering, or making decisions?: No Patient able to express need for assistance with ADLs?: Yes Does the patient have difficulty dressing or bathing?: No Independently performs ADLs?: Yes (appropriate for developmental age) Does the patient have difficulty walking or climbing stairs?: No Weakness of Legs: None Weakness of Arms/Hands: None  Permission Sought/Granted                  Emotional Assessment              Admission diagnosis:  behave med eval Patient Active Problem List   Diagnosis Date Noted  . Bipolar disorder, curr episode mixed, severe, with psychotic features (HCC)   . MDD (major depressive disorder), recurrent episode, severe (HCC) 04/08/2020  . Thoughts of self harm 02/01/2020  . Abdominal pain 03/20/2018  . Cocaine use disorder, moderate, dependence (HCC) 01/01/2018  . Cannabis use disorder, moderate, dependence (HCC) 01/01/2018  . Pregnant 12/31/2017  . Medically noncompliant 12/31/2017  .  Tobacco use disorder 01/12/2016  . Stimulant use disorder (HCC) (cocaine) 01/12/2016  . Alcohol use disorder, moderate, dependence (HCC) 01/12/2016  . Self-inflicted laceration of wrist (HCC) 01/11/2016  . MDD (major depressive disorder) 01/11/2016   PCP:  Patient, No Pcp Per Pharmacy:   [facility] - [place], [place] - [address] [place] [address] Phone: [phone] Fax: [phone]  [facility] [address] ([address]), [place] - [address] [place]) [place] [address] Phone: [phone] Fax: [phone]     Social Determinants of Health (SDOH) Interventions    Readmission Risk Interventions No flowsheet data found.

## 2020-05-18 NOTE — ED Notes (Signed)
Pt given wash cloth and soap to wash up.

## 2020-05-18 NOTE — ED Provider Notes (Signed)
Emergency Medicine Observation Re-evaluation Note  [name] is a 34 y.o. female, seen on rounds today.  Pt initially presented to the ED for complaints of Depression Currently, the patient is resting in no acute distress.  Physical Exam  BP 120/79   Pulse 74   Temp 98.5 F (36.9 C) (Oral)   Resp 17   Ht 4\' 11"  (1.499 m)   Wt 73.5 kg   LMP 12/04/2019 (Within Months)   SpO2 100%   BMI 32.72 kg/m  Physical Exam  ED Course / MDM  EKG:    I have reviewed the labs performed to date as well as medications administered while in observation.  Recent changes in the last 24 hours include no events overnight. Plan  Current plan is for psychiatric disposition. Patient is under full IVC at this time.   14/09/2019, MD 05/18/20 [phone]

## 2020-05-19 ENCOUNTER — Emergency Department: Payer: Medicaid Other

## 2020-05-19 LAB — TYPE AND SCREEN
ABO/RH(D): O POS
Antibody Screen: NEGATIVE

## 2020-05-19 LAB — RAPID HIV SCREEN (HIV 1/2 AB+AG)
HIV 1/2 Antibodies: NONREACTIVE
HIV-1 P24 Antigen - HIV24: NONREACTIVE

## 2020-05-19 LAB — CHLAMYDIA/NGC RT PCR (ARMC ONLY)
Chlamydia Tr: NOT DETECTED
N gonorrhoeae: DETECTED — AB

## 2020-05-19 MED ORDER — ACETAMINOPHEN 500 MG PO TABS
500.0000 mg | ORAL_TABLET | Freq: Four times a day (QID) | ORAL | Status: DC | PRN
  Administered 2020-05-19 – 2020-05-22 (×4): 500 mg via ORAL
  Filled 2020-05-19 (×4): qty 1

## 2020-05-19 MED ORDER — PRENATAL PLUS 27-1 MG PO TABS
1.0000 | ORAL_TABLET | Freq: Every day | ORAL | Status: DC
  Administered 2020-05-19 – 2020-05-23 (×5): 1 via ORAL
  Filled 2020-05-19 (×5): qty 1

## 2020-05-19 MED ORDER — ACETAMINOPHEN 500 MG PO TABS
ORAL_TABLET | ORAL | Status: AC
  Administered 2020-05-21: 500 mg via ORAL
  Filled 2020-05-19: qty 1

## 2020-05-19 NOTE — Consult Note (Signed)
Consult History and Physical   SERVICE: Obstetrics  Patient Name: [name] [facility] Patient MRN:   [id number]  CC: Suicidal ideation   HPI: [name] is a 34 y.o. [id number] at [redacted]w[redacted]d, dated by [redacted]w[redacted]d [place], presented to ED on 05/11/2020 with suicidal ideation.   Chief Complaint: depression  [name] presented to the ED at [facility] on 05/11/2020 with complaints of suicidal ideation.  She reports a history of anxiety.  She initially reported to the triage RN that she had a plan to cut her wrist to kill herself.  [name] states that her suicidal thoughts happened in response to finding her mother dead at home the day before she arrived to the ED.  Reports current cocaine, marijuana, and ETOH use.  Does not currently receive regular prenatal care at a specific clinic.  Has been seen by [facility] and [facility] during past pregnancies.     Past Obstetrical History: OB History    Gravida  6   Para  4   Term  3   Preterm  1   AB  1   Living  3     SAB  1   TAB      Ectopic      Multiple      Live Births  4        Obstetric Comments  Had a 35 year old that passed away        Past Gynecologic History: Patient's last menstrual period was 12/04/2019 (within months).   Past Medical History: Past Medical History:  Diagnosis Date  . Anxiety   . Depression   . History of substance abuse ([place])   . History of suicide attempt   . History of thyroid disease   . Hypertension   . Thyroid disease     Past Surgical History:   Past Surgical History:  Procedure Laterality Date  . CESAREAN SECTION  2000, 2002, 2013    Family History:  family history includes Diabetes in her mother.  Social History:   reports that she has been smoking. She has been smoking about 0.50 packs per day. She has never used smokeless tobacco. She reports previous alcohol use. She reports previous drug use. Drugs: Cocaine and Marijuana.  Home Medications:  Medications reconciled in EPIC  No current  facility-administered medications on file prior to encounter.   Current Outpatient Medications on File Prior to Encounter  Medication Sig Dispense Refill  . FLUoxetine (PROZAC) 20 MG capsule Take 1 capsule (20 mg total) by mouth daily. 30 capsule 1  . fluticasone (FLONASE) 50 MCG/ACT nasal spray Place 2 sprays into both nostrils daily. 18.2 mL 1  . hydrOXYzine (ATARAX/VISTARIL) 50 MG tablet Take 2 tablets (100 mg total) by mouth at bedtime as needed (insomnia). 30 tablet 0  . hydrOXYzine (ATARAX/VISTARIL) 50 MG tablet Take 1 tablet (50 mg total) by mouth 2 (two) times daily as needed for anxiety. 30 tablet 1  . Prenatal Vit-Fe Fumarate-FA (PRENATAL MULTIVITAMIN) TABS tablet Take 1 tablet by mouth daily at 12 noon. 30 tablet 1  . pyridOXINE (VITAMIN B-6) 25 MG tablet Take 1 tablet (25 mg total) by mouth daily. 30 tablet 1    Allergies:  Allergies  Allergen Reactions  . Aspirin Anaphylaxis, Diarrhea and Nausea And Vomiting  . Banana Anaphylaxis, Itching, Shortness Of Breath and Swelling  . Other Itching, Shortness Of Breath and Swelling  . Peanut Oil Itching, Shortness Of Breath and Swelling  . Peanut-Containing Drug Products Shortness Of Breath,  Itching and Swelling  . Pecan Nut (Diagnostic) Anaphylaxis    Physical Exam:  Temp:  [98.6 F (37 C)-98.7 F (37.1 C)] 98.6 F (37 C) (05/26 0947) Pulse Rate:  [70-76] 70 (05/26 1625) Resp:  [16-18] 18 (05/26 1625) BP: (135-139)/(62-83) 135/83 (05/26 1625) SpO2:  [100 %] 100 % (05/26 1625)   General Appearance:  Well developed, well nourished, withdrawn, appropriate speech  HEENT:  Normocephalic atraumatic Cardiovascular:  Normal S1/S2, regular rate and rhythm, no murmurs, 2+ distal pulses Pulmonary:  clear to auscultation, no wheezes, rales or rhonchi, symmetric air entry, good air exchange Abdomen:  Bowel sounds present, soft,nondistended, no abnormal masses or organomegaly, no epigastric pain, gravid, + fetal movement Back: inspection  of back is normal, no CVAT Extremities:  extremities normal, no tenderness, atraumatic, no cyanosis or edema Skin:  normal coloration and turgor, no rashes, no suspicious skin lesions noted  Neurologic:  Grossly equal strength and muscle tone, normal speech, no focal findings or movement disorder noted. Psychiatric:  Depressed and anxious, able to answer questions  Pelvic:  Deferred    Labs/Studies:   CBC and Coags:  Lab Results  Component Value Date   WBC 15.4 (H) 05/11/2020   NEUTOPHILPCT 62 04/12/2020   EOSPCT 6 04/12/2020   BASOPCT 0 04/12/2020   LYMPHOPCT 26 04/12/2020   HGB 11.5 (L) 05/11/2020   HCT 33.4 (L) 05/11/2020   MCV 87.2 05/11/2020   PLT 317 05/11/2020   CMP:  Lab Results  Component Value Date   NA 136 05/11/2020   K 3.6 05/11/2020   CL 102 05/11/2020   CO2 23 05/11/2020   BUN 9 05/11/2020   CREATININE 0.58 05/11/2020   CREATININE 0.58 04/12/2020   CREATININE 0.51 04/07/2020   PROT 7.5 05/11/2020   BILITOT 0.7 05/11/2020   ALT 13 05/11/2020   AST 18 05/11/2020   ALKPHOS 72 05/11/2020    Other Imaging: US OB Limited  Result Date: 05/19/2020 CLINICAL DATA:  Twenty-three weeks and 5 days pregnant by last menstrual period. No prenatal care. EXAM: LIMITED OBSTETRIC ULTRASOUND FINDINGS: Number of Fetuses: 1 Heart Rate:  160 bpm Movement: Visualized Presentation: Transverse with the fetal head on the maternal right. Previa: No Placental Location: Anterior Amniotic Fluid (Subjective): Normal AFI 12.3 cm BPD:  5.52cm 22w 6d Maternal Findings: Cervix:  Closed, 3.7 cm in length Uterus/Adnexae: 6.0 x 4.5 x 3.4 cm oval, mildly heterogeneous and echogenic mass-like area in the posterior aspect of the lower uterine segment. Normal appearing maternal right ovary. The left ovary is not clearly visualized, with no left adnexal mass seen. No free peritoneal fluid. IMPRESSION: 1. Single live intrauterine gestation in a transverse presentation with no complicating features seen.  2. 6.0 x 4.5 x 3.4 cm fibroid or contraction in the posterior aspect of the lower uterine segment. Electronically Signed   By: Beckie Salts M.D.   On: 05/19/2020 16:13     Assessment / Plan:   [name] is a 34 y.o. [id number] who presents with RLQ abdominal pain  1. Suicidal ideation:  -Current IVC - has been in ED for safety since 05/11/2020 -Recommend transfer to [facility] d/t multiple high risk components to include mental health instability, no prenatal care in current pregnancy, high risk exposure to substances, high risk pregnancy.    2. Covid positive in pregnancy: -No current symptoms -Will continue to monitor vital signs  -Covid + status is limiting placement for mental health needs - per ID, quarantine status will be complete  on 05/22/2020  3. Pregnancy -Patient is [redacted]w[redacted]d. She states that she has not established routine prenatal care yet, but she has been seen by [facility] and [facility] during past pregnancies.  -OB limited US ordered to assess fetal status  -FHT q shift  -Prenatal labs ordered  -Recommend MFM consult: High risk pregnancy d/t multiple substance use in pregnancy, has not established routine prenatal care, previous c/section x 4 -Will contact [facility] MFM to discuss possible transfer and recommended plan of care.   Discussed my recommendations with Dr. Scotty Court.  He will reach out to [facility] transfer center to assess possible transfer.  Plan of care also discussed with Dr. Feliberto Gottron.  Agrees with recommendation for transfer to tertiary center d/t multiple high risk needs.    Thank you for the opportunity to be involved with this patient's care.  ----- Margaretmary Eddy, CNM  Midwife [facility], Department of OB/GYN [facility]

## 2020-05-19 NOTE — ED Provider Notes (Signed)
Procedures     ----------------------------------------- 3:38 PM on 05/19/2020 -----------------------------------------  Discussed with obstetrics midwife who has ordered ultrasound and some prenatal labs.  She recommends checking with [facility] to see if they can accept the patient to antepartum unit.  I did discuss with [facility] patient logistics who advises after checking with their physicians that they are unable to accept the patient given the confluence of psychiatry, obstetrics, and Covid needs.  Discussed with hospitalist team, advises that per ID, patient can be cleared from Covid precautions on 05/22/2020 which represents a 10-day quarantine from her positive Covid result..  Patient remains in the ED for psychiatric treatment.    Sharman Cheek, MD 05/19/20 1540

## 2020-05-19 NOTE — ED Notes (Signed)
[facility] spoke to [place] for transfer  [phone]

## 2020-05-19 NOTE — ED Provider Notes (Signed)
Emergency Medicine Observation Re-evaluation Note  [name] is a 34 y.o. female, seen on rounds today.  Pt initially presented to the ED for complaints of Depression Currently, the patient is in NAD.  Physical Exam  BP 139/62 (BP Location: Right Arm)   Pulse 76   Temp 98.7 F (37.1 C) (Oral)   Resp 16   Ht 4\' 11"  (1.499 m)   Wt 73.5 kg   LMP 12/04/2019 (Within Months)   SpO2 100%   BMI 32.72 kg/m  Physical Exam  ED Course / MDM  EKG:    I have reviewed the labs performed to date as well as medications administered while in observation.  Recent changes in the last 24 hours include none. Plan  Current plan is for psych eval/disposition. Patient is under full IVC at this time.   14/09/2019, MD 05/19/20 [phone]

## 2020-05-19 NOTE — ED Notes (Signed)
Pt given meal tray.

## 2020-05-19 NOTE — ED Provider Notes (Signed)
  Physical Exam  BP 135/83 (BP Location: Left Arm)   Pulse 70   Temp 98.6 F (37 C) (Oral)   Resp 18   Ht 4\' 11"  (1.499 m)   Wt 73.5 kg   LMP 12/04/2019 (Within Months)   SpO2 100%   BMI 32.72 kg/m   Physical Exam  ED Course/Procedures     Procedures  MDM  Care assumed at 4 pm.  Dr. 14/09/2019 try to transfer patient to [facility] but they are full.  I which was called by the midwife right now and they also discussed case with MFM and OB. They are also full.  They state that we can try in 48 hours so on Friday, if patient is still here we can try to transfer patient again.      Saturday, MD 05/19/20 [phone]

## 2020-05-19 NOTE — ED Notes (Signed)
Pt resting on stretcher with door shut and lights off to enhance rest. Eyes closed and unlabored respirations. Provided for pt comfort and safety and will continue to monitor.

## 2020-05-19 NOTE — ED Notes (Signed)
[facility] would not accept patient 

## 2020-05-19 NOTE — ED Notes (Signed)
Resumed care from kelly g rn.  U/s in with pt now fetal heart tones 162 right lower quad.  Pt alert, was eating lunch.  No acute distress.

## 2020-05-19 NOTE — ED Notes (Signed)
Meds given.  Pt watching tv °

## 2020-05-20 LAB — RUBELLA SCREEN: Rubella: 1.23 index (ref >0.99)

## 2020-05-20 LAB — RPR: RPR Ser Ql: NONREACTIVE

## 2020-05-20 LAB — VARICELLA ZOSTER ANTIBODY, IGG: Varicella IgG: <135 index — ABNORMAL LOW (ref >165)

## 2020-05-20 MED ORDER — CEFTRIAXONE SODIUM 1 G IJ SOLR
1.0000 g | Freq: Once | INTRAMUSCULAR | Status: AC
  Administered 2020-05-20: 1 g via INTRAMUSCULAR
  Filled 2020-05-20: qty 10

## 2020-05-20 MED ORDER — SODIUM CHLORIDE 0.9 % IV SOLN: 2.0000 g | Freq: Once | INTRAVENOUS | Status: DC

## 2020-05-20 MED ORDER — AZITHROMYCIN 500 MG PO TABS
1000.0000 mg | ORAL_TABLET | Freq: Once | ORAL | Status: AC
  Administered 2020-05-20: 1000 mg via ORAL
  Filled 2020-05-20: qty 2

## 2020-05-20 NOTE — ED Notes (Signed)
IVC/Pending Placement 

## 2020-05-20 NOTE — ED Notes (Signed)
Resumed care from Shellie Rogoff t rn.  Pt alert.  No acute distress.  Watching tv and eating.

## 2020-05-20 NOTE — ED Provider Notes (Signed)
Emergency Medicine Observation Re-evaluation Note  [name] is a 34 y.o. female, seen on rounds today.  Pt initially presented to the ED for complaints of Depression Currently, the patient is in NAD   Physical Exam  BP (!) 123/59 (BP Location: Right Arm)   Pulse 84   Temp 98 F (36.7 C) (Oral)   Resp 18   Ht 4\' 11"  (1.499 m)   Wt 73.5 kg   LMP 12/04/2019 (Within Months)   SpO2 100%   BMI 32.72 kg/m  Physical Exam  ED Course / MDM  EKG:    I have reviewed the labs performed to date as well as medications administered while in observation.   Plan  Current plan is for placement 3 Patient is under full IVC at this time.   14/09/2019, MD 05/20/20 [phone]

## 2020-05-21 NOTE — ED Notes (Signed)
Pt reports coming into the hospital for SI and being 6 months pregnant. Pt denies all SI at this time and states she is having. Pt states chest pain and a headache that started this morning and that she got tylenol earlier for it.  Pt noted to have food at bedside and freedom to the bathroom. Provider notified of the pain and that tylenol was given at 1825. No new orders for pt.

## 2020-05-21 NOTE — ED Notes (Signed)
Pt given cereal 

## 2020-05-21 NOTE — ED Notes (Signed)
Pt asking if she can go home and if she can have some tylenol- pt informed that d/t covid quarantine she would remain at this facility until tomorrow- will give pt PRN ordered tylenol

## 2020-05-21 NOTE — ED Provider Notes (Signed)
Emergency Medicine Observation Re-evaluation Note  [name] is a 34 y.o. female, seen on rounds today.  Pt initially presented to the ED for complaints of Depression Currently, the patient is resting  Physical Exam  BP (!) 145/74 (BP Location: Left Arm)   Pulse 80   Temp 98.2 F (36.8 C) (Oral)   Resp 18   Ht 4\' 11"  (1.499 m)   Wt 73.5 kg   LMP 12/04/2019 (Within Months)   SpO2 99%   BMI 32.72 kg/m  Physical Exam  ED Course / MDM  EKG:    I have reviewed the labs performed to date as well as medications administered while in observation. Plan  Current plan is for pscyh dispo. Patient is under full IVC at this time.   14/09/2019, MD 05/21/20 [phone]

## 2020-05-21 NOTE — ED Notes (Signed)
Provider notified pt is refusing sertraline and zyprexa and still complaining of pain.

## 2020-05-21 NOTE — ED Notes (Signed)
Pt given ginger ale.

## 2020-05-21 NOTE — ED Notes (Signed)
Pt given meal tray.

## 2020-05-22 MED ORDER — SERTRALINE HCL 50 MG PO TABS
100.0000 mg | ORAL_TABLET | Freq: Every day | ORAL | Status: DC
  Administered 2020-05-22: 100 mg via ORAL
  Filled 2020-05-22: qty 2

## 2020-05-22 MED ORDER — ACETAMINOPHEN 500 MG PO TABS
1000.0000 mg | ORAL_TABLET | Freq: Once | ORAL | Status: AC
  Administered 2020-05-22: 1000 mg via ORAL
  Filled 2020-05-22: qty 2

## 2020-05-22 NOTE — ED Notes (Signed)
Pt remembered this RN from being assigned her last week, inquired how pt had been doing the last few days, pt stated to this RN that "still sad, but don't want to kill myself"

## 2020-05-22 NOTE — BH Assessment (Addendum)
Referral information for Psychiatric Hospitalization faxed to;   [facility] [phone]), Currently under review with admissions staff [facility]  . [facility] [phone]),   . [facility] [phone] -or- [phone]),   . [facility] [phone])  . [facility] [phone]),    [facility] [phone])

## 2020-05-22 NOTE — ED Notes (Signed)
Provided pt w/ phone to order her lunch.

## 2020-05-22 NOTE — Progress Notes (Addendum)
   05/22/20 1100  Clinical Encounter Type  Visited With Patient  Visit Type Initial;Spiritual support;Social support  Referral From Nurse  Consult/Referral To Chaplain  Responded to page from Ed nurse. When I arrived, I was informed Pt was in distress and also was Covid Positive. I informed the nurse that I could talk to her on the phone. Pt called and we had a lengthy talk. Pt was telling me that she felt sad about her mother dying. She also told me that she treated her mother bad when she was alive. Pt was also missing her kids. Pt was very excited and having trouble breathing. The first thing I tried to do was to get her to calm down. I let her know that she could not fix anything as long as she was hyped up. Pt agreed and then she calmed down. I told her things in life happen and that she should not blame herself for her mother's death. Pt informed me that she was not a religious person, but she does believe in a higher power. She also said that she prays to a high power. I encourage her to lean on her high power when she is in distress. Pt asked me to pray for her. Ch prayed for Pt.

## 2020-05-22 NOTE — ED Notes (Signed)
Asked ED secretary to contact chaplain for consult per pt request.

## 2020-05-22 NOTE — ED Notes (Signed)
Pt provided with ice water. Pts blinds on door are open. Pt states headache is getting better.

## 2020-05-22 NOTE — Consult Note (Signed)
Patient seen and case re-evaluated. As per patient " Im so so. I don't feel good but not bad. I found my momma dead I don't know how I am supposed to feel. I lost my 2 kids to social services, I lost my momma, I found out I have Covid, I have an STD. I cant lay in the room next to where she was at knowing she died in the next room. I really don't even want this baby." Patient is alert and oriented. She continues to present with pressured speech, rumination and very tangential. She has a flight of ideas and rambling at times. She does endorse multiple stressors as noted above that contribute to her depression, mood lability, and suicidal ideations. She has been abusing substances and she admits to not knowing what she used prior to the hospital. " I dont know what I took or what I used.Im not going to lie to you. I found my momma dead mam I could have used anything. " Patient is a poor historian and has not provided any prenatal care to the baby at all. She is currently unaware of her current gestation, and states she does not want this baby but has not advised anyone of this.  Per patient "I came because I had no where else to go, so they told me to come here. "   When assessing for safety patient unable to contract for safety and remains suicidal. " She states " I want to drink until im oblivion. She has refused her Zyprexa and Zoloft noting it is making her have vivid dreams and night terrors. UDS positive for amphetamines, Cocaine, and THC.  At this time she continues to meet inpatient criteria due to ongoing suicidal ideations, chronic stressors, and recent losses. Her placement has been complicated by covid 19 diagnosis and intrauterine pregnancy. She has been in the hospital for 10 days, and her quarantine period ends today. Will attempt to refax to new facilities and follow up with maternity admissions for bed availability.Will increase zoloft 100mg  po daily for depression. Will continue zyprexa.

## 2020-05-22 NOTE — ED Notes (Signed)
Provided pt w/ phone to order breakfast. Wiped pt's table down per request.

## 2020-05-22 NOTE — ED Notes (Signed)
Pt provided w/ dinner tray

## 2020-05-22 NOTE — ED Notes (Signed)
Pt didn't like hot dogs for lunch, pt ordered ham and cheese.

## 2020-05-22 NOTE — ED Notes (Signed)
Pt requested a phone to call son. Informed pt all phones were being used at the time.

## 2020-05-22 NOTE — ED Notes (Addendum)
RN went into room and pt noted to have lotion, toothpaste, toothbrush and comb. After talking with a psych primary RN, learned pt was not to have at bedside and RN removed from room. Unknown who gave pt and at what time they were provided to pt.  Pt calm and cooperative

## 2020-05-23 DIAGNOSIS — F1490 Cocaine use, unspecified, uncomplicated: Secondary | ICD-10-CM

## 2020-05-23 DIAGNOSIS — O99343 Other mental disorders complicating pregnancy, third trimester: Secondary | ICD-10-CM

## 2020-05-23 DIAGNOSIS — F329 Major depressive disorder, single episode, unspecified: Secondary | ICD-10-CM

## 2020-05-23 DIAGNOSIS — F1910 Other psychoactive substance abuse, uncomplicated: Secondary | ICD-10-CM

## 2020-05-23 DIAGNOSIS — F1010 Alcohol abuse, uncomplicated: Secondary | ICD-10-CM

## 2020-05-23 DIAGNOSIS — O99323 Drug use complicating pregnancy, third trimester: Secondary | ICD-10-CM

## 2020-05-23 DIAGNOSIS — O99313 Alcohol use complicating pregnancy, third trimester: Secondary | ICD-10-CM

## 2020-05-23 DIAGNOSIS — F1290 Cannabis use, unspecified, uncomplicated: Secondary | ICD-10-CM

## 2020-05-23 DIAGNOSIS — R45851 Suicidal ideations: Secondary | ICD-10-CM

## 2020-05-23 NOTE — ED Notes (Signed)
Follow up appointment reviewed, pt states understanding. Pt waiting for family for ride home.

## 2020-05-23 NOTE — ED Notes (Signed)
Report to include Situation, Background, Assessment, and Recommendations received from RN Leotis Shames [facility]). Patient alert and oriented, warm and dry, in no acute distress. Patient denies SI, HI, AVH and pain. Patient made aware of Q15 minute rounds and Psychologist, counselling presence for their safety. Patient instructed to come to me with needs or concerns.

## 2020-05-23 NOTE — ED Notes (Signed)
Pt eating breakfast, tolerating well.

## 2020-05-23 NOTE — ED Notes (Signed)
Pt up to bathroom. IVC rescinded. Pt belongings retrieved from locker. Pt to call family to pick her up. Pt given phone and phone numbers.

## 2020-05-23 NOTE — ED Provider Notes (Signed)
Emergency Medicine Observation Re-evaluation Note  [name] is a 34 y.o. female, seen on rounds today.  Pt initially presented to the ED for complaints of Depression Currently, the patient is resting in no acute distress.  Physical Exam  BP 117/61 (BP Location: Right Arm)   Pulse 79   Temp 98.1 F (36.7 C) (Oral)   Resp 16   Ht 4\' 11"  (1.499 m)   Wt 73.5 kg   LMP 12/04/2019 (Within Months)   SpO2 100%   BMI 32.72 kg/m  Physical Exam  ED Course / MDM  EKG:    I have reviewed the labs performed to date as well as medications administered while in observation.  Recent changes in the last 24 hours include no events overnight. Plan  Current plan is for psychiatric disposition. Patient is under full IVC at this time.   14/09/2019, MD 05/23/20 [phone]

## 2020-05-23 NOTE — BH Assessment (Signed)
Follow up Appointment [facility] Date: Tuesday, June 4th 2021 Time: 12:30pm Arrive 15 minutes early to complete paperwork  Address: [address],                   [place], [place] [address] Phone: [phone]  Instructions: Bring with you; Discharge papers, photo ID, proof of insurance (if you have any). You must wear a mask.  Information provided to the patient.

## 2020-05-23 NOTE — ED Notes (Signed)
Pt requesting cereal, pt given cereal and milk

## 2020-05-23 NOTE — ED Notes (Signed)
Pt requesting to take a shower. Pt was taken to the decon shower per requested and allowed to shower since she is off of quarantine today. Pt changed into new clean red wine scrubs. Pt's bed linen was changed and her previous personal toiletry items were removed from the room. Pt denies any SI/HI/ AVH at this time.

## 2020-05-23 NOTE — Discharge Instructions (Addendum)
Please seek medical attention and help for any thoughts about wanting to harm yourself, harm others, any concerning change in behavior, severe depression, inappropriate drug use or any other new or concerning symptoms. ° °

## 2020-05-23 NOTE — ED Notes (Signed)
Hourly rounding reveals patient in room. No complaints, stable, in no acute distress. Q15 minute rounds and monitoring via Security Cameras to continue. 

## 2020-05-23 NOTE — Consult Note (Signed)
[facility] Psych ED Discharge  05/23/2020 11:40 AM [name]  MRN:  [id number] Principal Problem: <principal problem not specified> Discharge Diagnoses: Active Problems:   Self-inflicted laceration of wrist (HCC)   MDD (major depressive disorder)   Tobacco use disorder   Stimulant use disorder (HCC) (cocaine)   Alcohol use disorder, moderate, dependence ([place])   Pregnant   Medically noncompliant   Cocaine use disorder, moderate, dependence (HCC)   Cannabis use disorder, moderate, dependence (HCC)   Abdominal pain   Thoughts of self harm   MDD (major depressive disorder), recurrent episode, severe ([place])  Subjective:  [name], 34 y.o., female patient who presented to [facility] 2 week prior for evaluation of suicidal ideations with a plan to cut her wrist.  Her presentation is complicated as she is 6 months pregnant and on arrival her urine drug screen was positive for amphetamines, cocaine, and cannabinoids.  Based on imminent safety concerns, she met the criteria for inpatient psychiatric admission but per admission labs, the patient was COVID 19 positive which made it difficult to locate suitable placement.  Thus, she remained at the emergency department under quarantine where she was started on Olanzapine 52m at bedtime and sertraline 585m titrated up to 10027maily  for mood stabilization while ongoing placement attempts were sought by social work.   May 23, 2020: The patient is seen by this provider; chart reviewed and consulted with Dr. ClaMallie Darting 05/23/20.  On evaluation [name] reports she is doing much better today.  She greets this wriProbation officerth a smile and spontaneously states her name.  She is watching a comedy show on tv, but turns her television down and agrees to speak with this wriProbation officerShe reports she slept well last night, and breakfast was "good for hospital food".  She is medication compliant and per chart review, she has not had GI medication side effects.  Since taking the medications, she demonstrates symptomatic improvement in her mood since admission.  She is still sad regarding her personal loss (mother recently deceased) but no longer endorses suicidal or homicidal ideations and contract for safety.  She is future oriented and asks this wriProbation officer assist with discharge follow-up plans with [facility].        During evaluation [name] is seated in an upright position in the hospital bed. She is alert/oriented x 4; calm/cooperative; and mood congruent with affect.  Patient is speaking in a clear tone at moderate volume, and normal pace; with good eye contact.  Her thought process is coherent and relevant; There is no indication that she is currently responding to internal/external stimuli or experiencing delusional thought content.  Patient denies suicidal/self-harm/homicidal ideation, psychosis, and paranoia.  Patient has remained calm throughout assessment and has answered questions appropriately.    Total Time spent with patient: 30 minutes  Past Psychiatric History: as outlined below  Past Medical History:  Past Medical History:  Diagnosis Date  . Anxiety   . Depression   . History of substance abuse ([place] . History of suicide attempt   . History of thyroid disease   . Hypertension   . Thyroid disease     Past Surgical History:  Procedure Laterality Date  . CESAREAN SECTION  2000, 2002, 2013   Family History:  Family History  Problem Relation Age of Onset  . Diabetes Mother    Family Psychiatric  History: unknown   Social History:  Social History   Substance and Sexual  Activity  Alcohol Use Not Currently   Comment: social drinker     Social History   Substance and Sexual Activity  Drug Use Not Currently  . Types: Cocaine, Marijuana   Comment: Positive UDS 12/2017    Social History   Socioeconomic History  . Marital status: Single    Spouse name: Not on file  . Number of children: Not on file  . Years of  education: Not on file  . Highest education level: Not on file  Occupational History  . Not on file  Tobacco Use  . Smoking status: Current Every Day Smoker    Packs/day: 0.50  . Smokeless tobacco: Never Used  Substance and Sexual Activity  . Alcohol use: Not Currently    Comment: social drinker  . Drug use: Not Currently    Types: Cocaine, Marijuana    Comment: Positive UDS 12/2017  . Sexual activity: Not Currently    Birth control/protection: None  Other Topics Concern  . Not on file  Social History Narrative  . Not on file   Social Determinants of Health   Financial Resource Strain:   . Difficulty of Paying Living Expenses:   Food Insecurity:   . Worried About Charity fundraiser in the Last Year:   . Arboriculturist in the Last Year:   Transportation Needs:   . Film/video editor (Medical):   Marland Kitchen Lack of Transportation (Non-Medical):   Physical Activity:   . Days of Exercise per Week:   . Minutes of Exercise per Session:   Stress:   . Feeling of Stress :   Social Connections:   . Frequency of Communication with Friends and Family:   . Frequency of Social Gatherings with Friends and Family:   . Attends Religious Services:   . Active Member of Clubs or Organizations:   . Attends Archivist Meetings:   Marland Kitchen Marital Status:     Has this patient used any form of tobacco in the last 30 days? (Cigarettes, Smokeless Tobacco, Cigars, and/or Pipes) A prescription for an FDA-approved tobacco cessation medication was offered at discharge and the patient refused  Current Medications: Current Facility-Administered Medications  Medication Dose Route Frequency Provider Last Rate Last Admin  . acetaminophen (TYLENOL) tablet 500 mg  500 mg Oral Q6H PRN Drenda Freeze, MD   500 mg at 05/22/20 1943  . OLANZapine (ZYPREXA) tablet 5 mg  5 mg Oral QHS Eulas Post, MD   5 mg at 05/22/20 2332  . prenatal vitamin w/FE, FA (PRENATAL 1 + 1) 27-1 MG tablet 1 tablet  1  tablet Oral Q1200 Carrie Mew, MD   1 tablet at 05/23/20 1123  . sertraline (ZOLOFT) tablet 100 mg  100 mg Oral Daily Suella Broad, FNP   100 mg at 05/22/20 2324   Current Outpatient Medications  Medication Sig Dispense Refill  . FLUoxetine (PROZAC) 20 MG capsule Take 1 capsule (20 mg total) by mouth daily. 30 capsule 1  . fluticasone (FLONASE) 50 MCG/ACT nasal spray Place 2 sprays into both nostrils daily. 18.2 mL 1  . hydrOXYzine (ATARAX/VISTARIL) 50 MG tablet Take 2 tablets (100 mg total) by mouth at bedtime as needed (insomnia). 30 tablet 0  . hydrOXYzine (ATARAX/VISTARIL) 50 MG tablet Take 1 tablet (50 mg total) by mouth 2 (two) times daily as needed for anxiety. 30 tablet 1  . Prenatal Vit-Fe Fumarate-FA (PRENATAL MULTIVITAMIN) TABS tablet Take 1 tablet by mouth daily at 12 noon. Marceline  tablet 1  . pyridOXINE (VITAMIN B-6) 25 MG tablet Take 1 tablet (25 mg total) by mouth daily. 30 tablet 1   PTA Medications: (Not in a hospital admission)   Musculoskeletal: Strength & Muscle Tone: within normal limits Gait & Station: normal Patient leans: N/A  Psychiatric Specialty Exam: Physical Exam Neurological:     Mental Status: She is alert.     Review of Systems  Blood pressure 117/61, pulse 79, temperature 98.1 F (36.7 C), temperature source Oral, resp. rate 16, height 4' 11"  (1.499 m), weight 73.5 kg, last menstrual period 12/04/2019, SpO2 100 %.Body mass index is 32.72 kg/m.  General Appearance: Casual and Fairly Groomed  Eye Contact:  Good  Speech:  Clear and Coherent and Normal Rate  Volume:  Normal  Mood:  Euthymic and improved since admission  Affect:  Appropriate and Congruent  Thought Process:  Coherent and Descriptions of Associations: Intact  Orientation:  Full (Time, Place, and Person)  Thought Content:  Logical  Suicidal Thoughts:  No  Homicidal Thoughts:  No  Memory:  Immediate;   Good Recent;   Good Remote;   Good  Judgement:  Other:  Fair in the  setting of continued substance abuse   Insight:  Fair  Psychomotor Activity:  Normal  Concentration:  Concentration: Good and Attention Span: Good  Recall:  Good  Fund of Knowledge:  Good  Language:  Good  Akathisia:  NA  Handed:  Right  AIMS (if indicated):     Assets:  Communication Skills Social Support  ADL's:  Intact  Cognition:  WNL  Sleep:   >6 hours     Demographic Factors:  Low socioeconomic status  Loss Factors: Loss of significant relationship  Historical Factors: Impulsivity  Risk Reduction Factors:   Positive social support  Continued Clinical Symptoms:  Alcohol/Substance Abuse/Dependencies Previous Psychiatric Diagnoses and Treatments  Cognitive Features That Contribute To Risk:  Closed-mindedness    Suicide Risk:  Mild:  Suicidal ideation of limited frequency, intensity, duration, and specificity.  There are no identifiable plans, no associated intent, mild dysphoria and related symptoms, good self-control (both objective and subjective assessment), few other risk factors, and identifiable protective factors, including available and accessible social support.  Follow-up Information    [facility] on 05/28/2020.   Why: Date: Tuesday, June 4th 2021 Time: 12:30pm Arrive 15 minutes early to complete paperwork Phone: [phone] Bring with you; Discharge papers, photo ID, proof of insurance (if you have any). You must wear a mask. Contact information: [place] [address] [phone]           Plan Of Care/Follow-up recommendations:  Plan- As per above assessment, there are no current grounds for involuntary commitment at this time.  Patient is future oriented and contracts for safety.  IVC is rescinded at this time.  Patient is not currently interested in inpatient services, but expresses agreement to continue outpatient treatment.  SW set up an [facility] outpatient follow-up visit for June 4@1230 .  We have  reviewed importance of substance abuse abstinence, potential negative impact substance abuse can have on her pregnancy, relationships and level of functioning, and importance of medication compliance.  Continue with medications as outlined above, refills to be provided at outpatient follow-up visit Above discussed with patient concordance.   Disposition: Discharge home.  The patient appears reasonably screened and/or stabilized for discharge and does not appear to have emergency medical/psychiatric concerns/conditions requiring further screening, evaluation, or treatment at this  time prior to discharge.    Mallie Darting, NP 05/23/2020, 11:40 AM

## 2020-05-23 NOTE — ED Notes (Addendum)
Pt given all paperwork and belongings. Pt ambulated to lobby, states that she does not want to wait for lunch tray. Pt with upbeat demeanor. Pt state she will follow up with [facility] as directed.

## 2020-05-23 NOTE — ED Notes (Signed)
Pt assisted with ordering breakfast, pt denies SI and HI. Pt calm, cooperative. Pt given apple juice per request

## 2020-05-28 ENCOUNTER — Encounter: Payer: Self-pay | Admitting: Obstetrics and Gynecology

## 2020-05-28 ENCOUNTER — Observation Stay
Admission: EM | Admit: 2020-05-28 | Discharge: 2020-05-28 | Disposition: A | Discharge status: Home / Self Care | Payer: Medicaid Other | Attending: Certified Nurse Midwife | Admitting: Certified Nurse Midwife

## 2020-05-28 ENCOUNTER — Other Ambulatory Visit: Payer: Self-pay

## 2020-05-28 DIAGNOSIS — W19XXXA Unspecified fall, initial encounter: Secondary | ICD-10-CM | POA: Diagnosis not present

## 2020-05-28 DIAGNOSIS — Z3A25 25 weeks gestation of pregnancy: Secondary | ICD-10-CM | POA: Insufficient documentation

## 2020-05-28 DIAGNOSIS — Z98891 History of uterine scar from previous surgery: Secondary | ICD-10-CM

## 2020-05-28 DIAGNOSIS — F329 Major depressive disorder, single episode, unspecified: Secondary | ICD-10-CM | POA: Insufficient documentation

## 2020-05-28 DIAGNOSIS — O99342 Other mental disorders complicating pregnancy, second trimester: Secondary | ICD-10-CM | POA: Diagnosis not present

## 2020-05-28 DIAGNOSIS — Z79899 Other long term (current) drug therapy: Secondary | ICD-10-CM | POA: Insufficient documentation

## 2020-05-28 DIAGNOSIS — F419 Anxiety disorder, unspecified: Secondary | ICD-10-CM | POA: Diagnosis not present

## 2020-05-28 LAB — URINALYSIS, ROUTINE W REFLEX MICROSCOPIC
Bacteria, UA: NONE SEEN
Bilirubin Urine: NEGATIVE
Glucose, UA: NEGATIVE mg/dL
Hgb urine dipstick: NEGATIVE
Ketones, ur: 20 mg/dL — AB
Nitrite: NEGATIVE
Protein, ur: NEGATIVE mg/dL
Specific Gravity, Urine: 1.028 (ref 1.005–1.030)
pH: 5 (ref 5.0–8.0)

## 2020-05-28 LAB — URINE DRUG SCREEN, QUALITATIVE (ARMC ONLY)
Amphetamines, Ur Screen: POSITIVE — AB
Barbiturates, Ur Screen: NOT DETECTED
Benzodiazepine, Ur Scrn: NOT DETECTED
Cannabinoid 50 Ng, Ur ~~LOC~~: POSITIVE — AB
Cocaine Metabolite,Ur ~~LOC~~: POSITIVE — AB
MDMA (Ecstasy)Ur Screen: NOT DETECTED
Methadone Scn, Ur: NOT DETECTED
Opiate, Ur Screen: NOT DETECTED
Phencyclidine (PCP) Ur S: NOT DETECTED
Tricyclic, Ur Screen: NOT DETECTED

## 2020-05-28 MED ORDER — PRENATAL MULTIVITAMIN CH: 1.0000 | ORAL_TABLET | Freq: Every day | ORAL | Status: DC

## 2020-05-28 MED ORDER — SERTRALINE HCL 50 MG PO TABS: 50.0000 mg | ORAL_TABLET | Freq: Every day | ORAL | 0 refills | Status: DC

## 2020-05-28 MED ORDER — CALCIUM CARBONATE ANTACID 500 MG PO CHEW: 2.0000 | CHEWABLE_TABLET | ORAL | Status: DC | PRN

## 2020-05-28 MED ORDER — ACETAMINOPHEN 325 MG PO TABS
650.0000 mg | ORAL_TABLET | ORAL | Status: DC | PRN
  Administered 2020-05-28: 650 mg via ORAL
  Filled 2020-05-28: qty 2

## 2020-05-28 NOTE — OB Triage Note (Addendum)
Pt. presented to L/D triage via EMS with two reported falls. She cannot recall when they were or how she fell. The patient reports being intoxicated at time of falls. She presents with constant dull and stabbing abdominal pain, rated 8/10. She pain is in her lower abdomen, bilaterally. Abrasions detected on the lower abdomen; although they appear scabbed over. Pt. Reports no bleeding or LOF and positive fetal movement. VSS. FHT 152 at 1450. Patient stable at this time.  Pt. reports that her other children are with their father at this time and are safe.  Pt. Reports feeling "sad" but no thoughts to hurt herself or others.

## 2020-05-28 NOTE — OB Triage Provider Note (Signed)
Triage Visit     [name] is a 34 y.o. [id number]. She is at 105w0d gestationdated by [redacted]w[redacted]d [place].  Patient's last menstrual period was 12/04/2019 (within months). Estimated Date of Delivery: 09/10/20 Prenatal care site: has not established care with any clinics yet  Chief Complaint: Constant dull and stabbing abdominal pain as the result of 2 falls yesterday.    Pt reports she was intoxicated at the time of her falls and doesn't remember how or where she fell. Reports GFM.  Denies bleeding or LOF. Recent admission to ED from 05/11/20 - 05/23/20 for suicidal ideations.  (Discharged with the follow up arranged with [facility] on 05/28/20.) Pt reported during that admission, current cocaine, marijuana, and ETOH use.  Does not currently receive prenatal care at a clinic.  Has been seen by [facility] and [facility] during past pregnancies. Quarantined for Covid + status from 05/11/20 to 05/22/20.  Subjective:  Resting comfortably.  Denies CTX, VB, LOF   Active fetal movement.   Past Medical History:  Diagnosis Date  . Anxiety   . Depression   . History of substance abuse ([place])   . History of suicide attempt   . History of thyroid disease   . Hypertension   . Thyroid disease     Past Surgical History:  Procedure Laterality Date  . CESAREAN SECTION  2000, 2002, 2013    Allergies  Allergen Reactions  . Aspirin Anaphylaxis, Diarrhea and Nausea And Vomiting  . Banana Anaphylaxis, Itching, Shortness Of Breath and Swelling  . Other Itching, Shortness Of Breath and Swelling  . Peanut Oil Itching, Shortness Of Breath and Swelling  . Peanut-Containing Drug Products Shortness Of Breath, Itching and Swelling  . Pecan Nut (Diagnostic) Anaphylaxis    Prior to Admission medications   Medication Sig Start Date End Date Taking? Authorizing Provider  Prenatal Vit-Fe Fumarate-FA (PRENATAL MULTIVITAMIN) TABS tablet Take 1 tablet by mouth daily at 12 noon. 02/06/20  Yes Money, Lowry Ram, FNP  FLUoxetine  (PROZAC) 20 MG capsule Take 1 capsule (20 mg total) by mouth daily. Patient not taking: Reported on 05/28/2020 04/12/20   Money, Lowry Ram, FNP  fluticasone (FLONASE) 50 MCG/ACT nasal spray Place 2 sprays into both nostrils daily. 04/13/20   Money, Lowry Ram, FNP  hydrOXYzine (ATARAX/VISTARIL) 50 MG tablet Take 2 tablets (100 mg total) by mouth at bedtime as needed (insomnia). Patient not taking: Reported on 05/28/2020 04/12/20   Money, Lowry Ram, FNP  hydrOXYzine (ATARAX/VISTARIL) 50 MG tablet Take 1 tablet (50 mg total) by mouth 2 (two) times daily as needed for anxiety. Patient not taking: Reported on 05/28/2020 04/12/20   Money, Lowry Ram, FNP  pyridOXINE (VITAMIN B-6) 25 MG tablet Take 1 tablet (25 mg total) by mouth daily. Patient not taking: Reported on 05/28/2020 02/06/20   Money, Lowry Ram, FNP     Social History:  reports that she has been smoking. She has been smoking about 0.50 packs per day. She has never used smokeless tobacco. She reports current alcohol use. She reports previous drug use. Drugs: Cocaine and Marijuana.  Family History: family history includes Diabetes in her mother.   Review of Systems: A full review of systems was performed and negative except as noted in the HPI.    Objective:  BP 135/71 (BP Location: Left Arm)   Pulse 73   Temp 97.8 F (36.6 C) (Oral)   Resp 18   Ht 4\' 11"  (1.499 m)   Wt 73.5 kg  LMP 12/04/2019 (Within Months)   BMI 32.73 kg/m  Results for orders placed or performed during the hospital encounter of 05/28/20 (from the past 48 hour(s))  Urinalysis, Routine w reflex microscopic   Collection Time: 05/28/20  4:36 PM  Result Value Ref Range   Color, Urine YELLOW (A) YELLOW   APPearance HAZY (A) CLEAR   Specific Gravity, Urine 1.028 1.005 - 1.030   pH 5.0 5.0 - 8.0   Glucose, UA NEGATIVE NEGATIVE mg/dL   Hgb urine dipstick NEGATIVE NEGATIVE   Bilirubin Urine NEGATIVE NEGATIVE   Ketones, ur 20 (A) NEGATIVE mg/dL   Protein, ur NEGATIVE NEGATIVE mg/dL    Nitrite NEGATIVE NEGATIVE   Leukocytes,Ua TRACE (A) NEGATIVE   RBC / HPF 0-5 0 - 5 RBC/hpf   WBC, UA 0-5 0 - 5 WBC/hpf   Bacteria, UA NONE SEEN NONE SEEN   Squamous Epithelial / LPF 6-10 0 - 5   Mucus PRESENT    Hyaline Casts, UA PRESENT   Urine Drug Screen, Qualitative ([facility] only)   Collection Time: 05/28/20  4:36 PM  Result Value Ref Range   Tricyclic, Ur Screen NONE DETECTED NONE DETECTED   Amphetamines, Ur Screen POSITIVE (A) NONE DETECTED   MDMA (Ecstasy)Ur Screen NONE DETECTED NONE DETECTED   Cocaine Metabolite,Ur [place] POSITIVE (A) NONE DETECTED   Opiate, Ur Screen NONE DETECTED NONE DETECTED   Phencyclidine (PCP) Ur S NONE DETECTED NONE DETECTED   Cannabinoid 50 Ng, Ur [place] POSITIVE (A) NONE DETECTED   Barbiturates, Ur Screen NONE DETECTED NONE DETECTED   Benzodiazepine, Ur Scrn NONE DETECTED NONE DETECTED   Methadone Scn, Ur NONE DETECTED NONE DETECTED     Constitutional: AAOx3  HE/ENT: extraocular movements grossly intact, moist mucous membranes CV: RRR PULM: nl respiratory effort     Abd: gravid, non-tender, non-distended, soft      Ext: Non-tender, Nonedmeatous   Psych: mood appropriate, speech normal Pelvic: deferred  FHT: FHR: 150 bpm, variability: moderate,  accelerations:  Abscent,  decelerations:  Absent Category/reactivity:  appropriate for GA TOCO: none   Assessment:   34 y.o. [redacted]w[redacted]d with high risk pregnancy and antepartum surveillance after a fall.  Principle Diagnosis:  Observation after fall, possible involving abdomen  Plan:   Fetal Wellbeing: FHT appropriate for GA.  Pt reports GFM  No VB, LOF, or UCs  D/c home stable, precautions reviewed, follow-up as scheduled.    Discussed with patient to have her call me at the office on Monday to get her some resources.    Haroldine Laws, CNM 05/28/2020 9:35 PM

## 2020-06-02 ENCOUNTER — Emergency Department
Admission: EM | Admit: 2020-06-02 | Discharge: 2020-06-03 | Disposition: A | Discharge status: Other Acute Inpatient Hospital | Payer: Medicaid Other | Attending: Emergency Medicine | Admitting: Emergency Medicine

## 2020-06-02 DIAGNOSIS — F1590 Other stimulant use, unspecified, uncomplicated: Secondary | ICD-10-CM | POA: Diagnosis present

## 2020-06-02 DIAGNOSIS — Z3490 Encounter for supervision of normal pregnancy, unspecified, unspecified trimester: Secondary | ICD-10-CM

## 2020-06-02 DIAGNOSIS — O99322 Drug use complicating pregnancy, second trimester: Secondary | ICD-10-CM | POA: Insufficient documentation

## 2020-06-02 DIAGNOSIS — F329 Major depressive disorder, single episode, unspecified: Secondary | ICD-10-CM | POA: Diagnosis present

## 2020-06-02 DIAGNOSIS — F332 Major depressive disorder, recurrent severe without psychotic features: Secondary | ICD-10-CM | POA: Diagnosis present

## 2020-06-02 DIAGNOSIS — R4589 Other symptoms and signs involving emotional state: Secondary | ICD-10-CM | POA: Diagnosis present

## 2020-06-02 DIAGNOSIS — R45851 Suicidal ideations: Secondary | ICD-10-CM | POA: Insufficient documentation

## 2020-06-02 DIAGNOSIS — Z91199 Patient's noncompliance with other medical treatment and regimen due to unspecified reason: Secondary | ICD-10-CM

## 2020-06-02 DIAGNOSIS — S61519A Laceration without foreign body of unspecified wrist, initial encounter: Secondary | ICD-10-CM | POA: Diagnosis present

## 2020-06-02 DIAGNOSIS — O10012 Pre-existing essential hypertension complicating pregnancy, second trimester: Secondary | ICD-10-CM | POA: Diagnosis not present

## 2020-06-02 DIAGNOSIS — Z3A26 26 weeks gestation of pregnancy: Secondary | ICD-10-CM | POA: Diagnosis not present

## 2020-06-02 DIAGNOSIS — F17200 Nicotine dependence, unspecified, uncomplicated: Secondary | ICD-10-CM | POA: Diagnosis not present

## 2020-06-02 DIAGNOSIS — O99332 Smoking (tobacco) complicating pregnancy, second trimester: Secondary | ICD-10-CM | POA: Diagnosis not present

## 2020-06-02 DIAGNOSIS — F1420 Cocaine dependence, uncomplicated: Secondary | ICD-10-CM | POA: Diagnosis present

## 2020-06-02 DIAGNOSIS — F1910 Other psychoactive substance abuse, uncomplicated: Secondary | ICD-10-CM | POA: Diagnosis not present

## 2020-06-02 DIAGNOSIS — F1020 Alcohol dependence, uncomplicated: Secondary | ICD-10-CM | POA: Diagnosis present

## 2020-06-02 DIAGNOSIS — F3164 Bipolar disorder, current episode mixed, severe, with psychotic features: Secondary | ICD-10-CM | POA: Insufficient documentation

## 2020-06-02 DIAGNOSIS — O99342 Other mental disorders complicating pregnancy, second trimester: Secondary | ICD-10-CM | POA: Diagnosis not present

## 2020-06-02 DIAGNOSIS — Z91010 Allergy to peanuts: Secondary | ICD-10-CM | POA: Insufficient documentation

## 2020-06-02 DIAGNOSIS — F1220 Cannabis dependence, uncomplicated: Secondary | ICD-10-CM | POA: Diagnosis present

## 2020-06-02 DIAGNOSIS — W19XXXA Unspecified fall, initial encounter: Secondary | ICD-10-CM | POA: Diagnosis present

## 2020-06-02 NOTE — Discharge Summary (Signed)
Patient ID: [name] MRN: [id number] DOB/AGE: 35/29/1987 35 y.o.  Admit date: 05/28/2020 Discharge date: 06/02/2020  Admission Diagnoses: Fall  Discharge Diagnoses: Post fall, stable  Prenatal Procedures: NST  Consults: none  Significant Diagnostic Studies:  Results for orders placed or performed during the hospital encounter of 05/28/20 (from the past 168 hour(s))  Urinalysis, Routine w reflex microscopic   Collection Time: 05/28/20  4:36 PM  Result Value Ref Range   Color, Urine YELLOW (A) YELLOW   APPearance HAZY (A) CLEAR   Specific Gravity, Urine 1.028 1.005 - 1.030   pH 5.0 5.0 - 8.0   Glucose, UA NEGATIVE NEGATIVE mg/dL   Hgb urine dipstick NEGATIVE NEGATIVE   Bilirubin Urine NEGATIVE NEGATIVE   Ketones, ur 20 (A) NEGATIVE mg/dL   Protein, ur NEGATIVE NEGATIVE mg/dL   Nitrite NEGATIVE NEGATIVE   Leukocytes,Ua TRACE (A) NEGATIVE   RBC / HPF 0-5 0 - 5 RBC/hpf   WBC, UA 0-5 0 - 5 WBC/hpf   Bacteria, UA NONE SEEN NONE SEEN   Squamous Epithelial / LPF 6-10 0 - 5   Mucus PRESENT    Hyaline Casts, UA PRESENT   Urine Drug Screen, Qualitative ([facility] only)   Collection Time: 05/28/20  4:36 PM  Result Value Ref Range   Tricyclic, Ur Screen NONE DETECTED NONE DETECTED   Amphetamines, Ur Screen POSITIVE (A) NONE DETECTED   MDMA (Ecstasy)Ur Screen NONE DETECTED NONE DETECTED   Cocaine Metabolite,Ur [place] POSITIVE (A) NONE DETECTED   Opiate, Ur Screen NONE DETECTED NONE DETECTED   Phencyclidine (PCP) Ur S NONE DETECTED NONE DETECTED   Cannabinoid 50 Ng, Ur [place] POSITIVE (A) NONE DETECTED   Barbiturates, Ur Screen NONE DETECTED NONE DETECTED   Benzodiazepine, Ur Scrn NONE DETECTED NONE DETECTED   Methadone Scn, Ur NONE DETECTED NONE DETECTED    Treatments: none  Hospital Course:  This is a 34 y.o. [id number] with IUP at [redacted]w[redacted]d observed following a fall (see triage note).  Discharge Physical Exam:  BP 135/71 (BP Location: Left Arm)   Pulse 73   Temp 97.8 F (36.6 C)  (Oral)   Resp 18   Ht 4\' 11"  (1.499 m)   Wt 73.5 kg   LMP 12/04/2019 (Within Months)   BMI 32.73 kg/m   General: NAD CV: RRR Pulm: nl effort ABD: s/nd/nt, gravid DVT Evaluation: LE non-ttp, no evidence of DVT on exam.    Discharge Condition: Stable  Disposition: Discharge disposition: 01-Home or Self Care        Allergies as of 05/28/2020      Reactions   Aspirin Anaphylaxis, Diarrhea, Nausea And Vomiting   Banana Anaphylaxis, Itching, Shortness Of Breath, Swelling   Other Itching, Shortness Of Breath, Swelling   Peanut Oil Itching, Shortness Of Breath, Swelling   Peanut-containing Drug Products Shortness Of Breath, Itching, Swelling   Pecan Nut (diagnostic) Anaphylaxis      Medication List    STOP taking these medications   FLUoxetine 20 MG capsule Commonly known as: PROZAC   fluticasone 50 MCG/ACT nasal spray Commonly known as: FLONASE   hydrOXYzine 50 MG tablet Commonly known as: ATARAX/VISTARIL   prenatal multivitamin Tabs tablet   pyridOXINE 25 MG tablet Commonly known as: VITAMIN B-6     TAKE these medications   sertraline 50 MG tablet Commonly known as: Zoloft Take 1 tablet (50 mg total) by mouth daily.      Follow-up Information    07/28/2020, CNM. Call in 3 day(s).  Specialty: Certified Nurse Midwife Contact information: [place] [address] [phone]           Signed:  Regina Eck 06/02/2020 3:43 PM

## 2020-06-02 NOTE — ED Provider Notes (Addendum)
[facility] Emergency Department Provider Note   ____________________________________________   First MD Initiated Contact with Patient 06/02/20 2338     (approximate)  I have reviewed the triage vital signs and the nursing notes.   HISTORY  Chief Complaint Suicidal Ideation   HPI [name] is a 34 y.o. female, G6 P3-1-1-3 at approximately 26 weeks of pregnancy, with history of depression and substance abuse, who presents to the ED complaining of suicidal ideation.  Patient reports that she has been feeling increasingly depressed recently and is having thoughts of harming herself.  She denies any specific plan, but does admit she has been drinking alcohol and using drugs.  She reports having "a few shots" tonight and has been drinking intermittently during this pregnancy.  She also admits to recent cocaine use, which she has been snorting.  She denies any abdominal pain or vaginal bleeding, has recently been feeling baby moving.  She denies any medical complaints at this time.        Past Medical History:  Diagnosis Date  . Anxiety   . Depression   . History of substance abuse ([facility])   . History of suicide attempt   . History of thyroid disease   . Hypertension   . Thyroid disease     Patient Active Problem List   Diagnosis Date Noted  . Fall 05/28/2020  . History of cesarean delivery 05/28/2020  . Bipolar disorder, curr episode mixed, severe, with psychotic features ([facility])   . MDD (major depressive disorder), recurrent episode, severe ([facility]) 04/08/2020  . Thoughts of self harm 02/01/2020  . Abdominal pain 03/20/2018  . Cocaine use disorder, moderate, dependence ([facility]) 01/01/2018  . Cannabis use disorder, moderate, dependence ([facility]) 01/01/2018  . Pregnant 12/31/2017  . Medically noncompliant 12/31/2017  . Tobacco use disorder 01/12/2016  . Stimulant use disorder ([facility]) (cocaine) 01/12/2016  . Alcohol use disorder, moderate, dependence ([facility])  01/12/2016  . Self-inflicted laceration of wrist ([facility]) 01/11/2016  . MDD (major depressive disorder) 01/11/2016    Past Surgical History:  Procedure Laterality Date  . CESAREAN SECTION  2000, 2002, 2013    Prior to Admission medications   Medication Sig Start Date End Date Taking? Authorizing Provider  sertraline (ZOLOFT) 50 MG tablet Take 1 tablet (50 mg total) by mouth daily. 05/28/20 06/27/20 Yes Haroldine Laws, CNM    Allergies Aspirin, Banana, Other, Peanut oil, Peanut-containing drug products, and Pecan nut (diagnostic)  Family History  Problem Relation Age of Onset  . Diabetes Mother     Social History Social History   Tobacco Use  . Smoking status: Current Every Day Smoker    Packs/day: 0.50  . Smokeless tobacco: Never Used  Substance Use Topics  . Alcohol use: Yes    Comment: social drinker  . Drug use: Not Currently    Types: Cocaine, Marijuana    Comment: Positive UDS 12/2017    Review of Systems  Constitutional: No fever/chills Eyes: No visual changes. ENT: No sore throat. Cardiovascular: Denies chest pain. Respiratory: Denies shortness of breath. Gastrointestinal: No abdominal pain.  No nausea, no vomiting.  No diarrhea.  No constipation. Genitourinary: Negative for dysuria. Musculoskeletal: Negative for back pain. Skin: Negative for rash. Neurological: Negative for headaches, focal weakness or numbness.  Positive for suicidal ideation.  ____________________________________________   PHYSICAL EXAM:  VITAL SIGNS: ED Triage Vitals [06/02/20 2340]  Enc Vitals Group     BP 136/72     Pulse Rate 76  Resp 18     Temp 98.1 F (36.7 C)     Temp Source Oral     SpO2 99 %     Weight      Height      Head Circumference      Peak Flow      Pain Score      Pain Loc      Pain Edu?      Excl. in [place]?    Constitutional: Alert and oriented. Eyes: Conjunctivae are normal. Head: Atraumatic. Nose: No congestion/rhinnorhea. Mouth/Throat: Mucous  membranes are moist. Neck: Normal ROM Cardiovascular: Normal rate, regular rhythm. Grossly normal heart sounds. Respiratory: Normal respiratory effort.  No retractions. Lungs CTAB. Gastrointestinal: Soft and nontender. No distention. Genitourinary: deferred Musculoskeletal: No lower extremity tenderness nor edema. Neurologic:  Normal speech and language. No gross focal neurologic deficits are appreciated. Skin:  Skin is warm, dry and intact. No rash noted. Psychiatric: Depressed mood. Speech and behavior are normal.  ____________________________________________   LABS (all labs ordered are listed, but only abnormal results are displayed)  Labs Reviewed  BASIC METABOLIC PANEL - Abnormal; Notable for the following components:      Result Value   Potassium 3.2 (*)    CO2 21 (*)    Creatinine, Ser 0.43 (*)    Calcium 8.2 (*)    All other components within normal limits  CBC WITH DIFFERENTIAL/PLATELET - Abnormal; Notable for the following components:   RBC 3.19 (*)    Hemoglobin 9.5 (*)    HCT 28.4 (*)    Eosinophils Absolute 1.0 (*)    All other components within normal limits  ETHANOL - Abnormal; Notable for the following components:   Alcohol, Ethyl (B) 89 (*)    All other components within normal limits  SALICYLATE LEVEL - Abnormal; Notable for the following components:   Salicylate Lvl <6.9 (*)    All other components within normal limits  ACETAMINOPHEN LEVEL - Abnormal; Notable for the following components:   Acetaminophen (Tylenol), Serum <10 (*)    All other components within normal limits  URINALYSIS, COMPLETE (UACMP) WITH MICROSCOPIC  URINE DRUG SCREEN, QUALITATIVE ([facility] ONLY)     PROCEDURES  Procedure(s) performed (including Critical Care):  Procedures   ____________________________________________   INITIAL IMPRESSION / ASSESSMENT AND PLAN / ED COURSE       34 year old female at approximately 26 weeks of pregnancy presents to the ED for suicidal  ideation with no specific plan.  She recently had extended stay here in the ED due to suicidal ideation and substance abuse in the setting of pregnancy.  Was also recently observed on L&D for a fall, with unremarkable work-up at that time.  She denies any medical complaints at this time, will screen labs but she is medically cleared pending psychiatric evaluation.  She will be placed under IVC given her active suicidal ideation.  She previously tested positive for COVID-19 on May 19, is asymptomatic at this time and repeat testing is not indicated.  The patient has been placed in psychiatric observation due to the need to provide a safe environment for the patient while obtaining psychiatric consultation and evaluation, as well as ongoing medical and medication management to treat the patient's condition.  The patient has been placed under full IVC at this time.       ____________________________________________   FINAL CLINICAL IMPRESSION(S) / ED DIAGNOSES  Final diagnoses:  Suicidal ideation  Polysubstance abuse ([place])  [redacted] weeks gestation of pregnancy  ED Discharge Orders    None       Note:  This document was prepared using Dragon voice recognition software and may include unintentional dictation errors.   Chesley Noon, MD 06/03/20 [id number]    Chesley Noon, MD 06/03/20 [phone]

## 2020-06-03 ENCOUNTER — Inpatient Hospital Stay
Admission: EM | Admit: 2020-06-03 | Discharge: 2020-06-14 | DRG: 832 | Disposition: A | Discharge status: Other Acute Inpatient Hospital | Payer: Medicaid Other | Source: Intra-hospital | Attending: Psychiatry | Admitting: Psychiatry

## 2020-06-03 ENCOUNTER — Encounter: Payer: Self-pay | Admitting: Psychiatry

## 2020-06-03 ENCOUNTER — Other Ambulatory Visit: Payer: Self-pay

## 2020-06-03 DIAGNOSIS — O99352 Diseases of the nervous system complicating pregnancy, second trimester: Secondary | ICD-10-CM | POA: Diagnosis present

## 2020-06-03 DIAGNOSIS — O98312 Other infections with a predominantly sexual mode of transmission complicating pregnancy, second trimester: Secondary | ICD-10-CM | POA: Diagnosis not present

## 2020-06-03 DIAGNOSIS — O2242 Hemorrhoids in pregnancy, second trimester: Secondary | ICD-10-CM | POA: Diagnosis present

## 2020-06-03 DIAGNOSIS — R45851 Suicidal ideations: Secondary | ICD-10-CM | POA: Diagnosis present

## 2020-06-03 DIAGNOSIS — R109 Unspecified abdominal pain: Secondary | ICD-10-CM | POA: Diagnosis not present

## 2020-06-03 DIAGNOSIS — F1590 Other stimulant use, unspecified, uncomplicated: Secondary | ICD-10-CM | POA: Diagnosis present

## 2020-06-03 DIAGNOSIS — Z9119 Patient's noncompliance with other medical treatment and regimen: Secondary | ICD-10-CM

## 2020-06-03 DIAGNOSIS — O99282 Endocrine, nutritional and metabolic diseases complicating pregnancy, second trimester: Secondary | ICD-10-CM | POA: Diagnosis present

## 2020-06-03 DIAGNOSIS — Z3A27 27 weeks gestation of pregnancy: Secondary | ICD-10-CM | POA: Diagnosis not present

## 2020-06-03 DIAGNOSIS — F1020 Alcohol dependence, uncomplicated: Secondary | ICD-10-CM | POA: Diagnosis present

## 2020-06-03 DIAGNOSIS — R197 Diarrhea, unspecified: Secondary | ICD-10-CM | POA: Diagnosis not present

## 2020-06-03 DIAGNOSIS — Z79899 Other long term (current) drug therapy: Secondary | ICD-10-CM

## 2020-06-03 DIAGNOSIS — Z915 Personal history of self-harm: Secondary | ICD-10-CM

## 2020-06-03 DIAGNOSIS — O162 Unspecified maternal hypertension, second trimester: Secondary | ICD-10-CM | POA: Diagnosis present

## 2020-06-03 DIAGNOSIS — F419 Anxiety disorder, unspecified: Secondary | ICD-10-CM | POA: Diagnosis present

## 2020-06-03 DIAGNOSIS — O99322 Drug use complicating pregnancy, second trimester: Secondary | ICD-10-CM | POA: Diagnosis present

## 2020-06-03 DIAGNOSIS — F1420 Cocaine dependence, uncomplicated: Secondary | ICD-10-CM | POA: Diagnosis present

## 2020-06-03 DIAGNOSIS — G4700 Insomnia, unspecified: Secondary | ICD-10-CM | POA: Diagnosis present

## 2020-06-03 DIAGNOSIS — Z3A26 26 weeks gestation of pregnancy: Secondary | ICD-10-CM | POA: Diagnosis not present

## 2020-06-03 DIAGNOSIS — O0990 Supervision of high risk pregnancy, unspecified, unspecified trimester: Secondary | ICD-10-CM

## 2020-06-03 DIAGNOSIS — K5900 Constipation, unspecified: Secondary | ICD-10-CM | POA: Diagnosis present

## 2020-06-03 DIAGNOSIS — O26892 Other specified pregnancy related conditions, second trimester: Secondary | ICD-10-CM | POA: Diagnosis present

## 2020-06-03 DIAGNOSIS — O99342 Other mental disorders complicating pregnancy, second trimester: Secondary | ICD-10-CM | POA: Diagnosis present

## 2020-06-03 DIAGNOSIS — Z590 Homelessness: Secondary | ICD-10-CM

## 2020-06-03 DIAGNOSIS — F332 Major depressive disorder, recurrent severe without psychotic features: Secondary | ICD-10-CM | POA: Diagnosis not present

## 2020-06-03 DIAGNOSIS — E876 Hypokalemia: Secondary | ICD-10-CM | POA: Diagnosis present

## 2020-06-03 DIAGNOSIS — O99332 Smoking (tobacco) complicating pregnancy, second trimester: Secondary | ICD-10-CM | POA: Diagnosis present

## 2020-06-03 DIAGNOSIS — A599 Trichomoniasis, unspecified: Secondary | ICD-10-CM | POA: Diagnosis not present

## 2020-06-03 DIAGNOSIS — Z8616 Personal history of COVID-19: Secondary | ICD-10-CM

## 2020-06-03 DIAGNOSIS — Z3490 Encounter for supervision of normal pregnancy, unspecified, unspecified trimester: Secondary | ICD-10-CM

## 2020-06-03 DIAGNOSIS — O09212 Supervision of pregnancy with history of pre-term labor, second trimester: Secondary | ICD-10-CM

## 2020-06-03 LAB — CBC WITH DIFFERENTIAL/PLATELET
Abs Immature Granulocytes: 0.07 10*3/uL (ref 0.00–0.07)
Basophils Absolute: 0 10*3/uL (ref 0.0–0.1)
Basophils Relative: 0 %
Eosinophils Absolute: 1 10*3/uL — ABNORMAL HIGH (ref 0.0–0.5)
Eosinophils Relative: 11 %
HCT: 28.4 % — ABNORMAL LOW (ref 36.0–46.0)
Hemoglobin: 9.5 g/dL — ABNORMAL LOW (ref 12.0–15.0)
Immature Granulocytes: 1 %
Lymphocytes Relative: 31 %
Lymphs Abs: 3 10*3/uL (ref 0.7–4.0)
MCH: 29.8 pg (ref 26.0–34.0)
MCHC: 33.5 g/dL (ref 30.0–36.0)
MCV: 89 fL (ref 80.0–100.0)
Monocytes Absolute: 0.6 10*3/uL (ref 0.1–1.0)
Monocytes Relative: 6 %
Neutro Abs: 4.8 10*3/uL (ref 1.7–7.7)
Neutrophils Relative %: 51 %
Platelets: 187 10*3/uL (ref 150–400)
RBC: 3.19 MIL/uL — ABNORMAL LOW (ref 3.87–5.11)
RDW: 14.5 % (ref 11.5–15.5)
WBC: 9.5 10*3/uL (ref 4.0–10.5)
nRBC: 0 % (ref 0.0–0.2)

## 2020-06-03 LAB — ETHANOL: Alcohol, Ethyl (B): 89 mg/dL — ABNORMAL HIGH (ref <10)

## 2020-06-03 LAB — BASIC METABOLIC PANEL
Anion gap: 9 (ref 5–15)
BUN: 8 mg/dL (ref 6–20)
CO2: 21 mmol/L — ABNORMAL LOW (ref 22–32)
Calcium: 8.2 mg/dL — ABNORMAL LOW (ref 8.9–10.3)
Chloride: 107 mmol/L (ref 98–111)
Creatinine, Ser: 0.43 mg/dL — ABNORMAL LOW (ref 0.44–1.00)
GFR calc Af Amer: >60 mL/min (ref >60)
GFR calc non Af Amer: >60 mL/min (ref >60)
Glucose, Bld: 90 mg/dL (ref 70–99)
Potassium: 3.2 mmol/L — ABNORMAL LOW (ref 3.5–5.1)
Sodium: 137 mmol/L (ref 135–145)

## 2020-06-03 LAB — SALICYLATE LEVEL: Salicylate Lvl: <7 mg/dL — ABNORMAL LOW (ref 7.0–30.0)

## 2020-06-03 LAB — ACETAMINOPHEN LEVEL: Acetaminophen (Tylenol), Serum: <10 ug/mL — ABNORMAL LOW (ref 10–30)

## 2020-06-03 MED ORDER — SERTRALINE HCL 25 MG PO TABS
50.0000 mg | ORAL_TABLET | Freq: Every day | ORAL | Status: DC
  Administered 2020-06-04 – 2020-06-10 (×7): 50 mg via ORAL
  Filled 2020-06-03 (×7): qty 2

## 2020-06-03 MED ORDER — TRAZODONE HCL 100 MG PO TABS
100.0000 mg | ORAL_TABLET | Freq: Once | ORAL | Status: AC
  Administered 2020-06-03: 100 mg via ORAL
  Filled 2020-06-03: qty 1

## 2020-06-03 MED ORDER — ALUM & MAG HYDROXIDE-SIMETH 200-200-20 MG/5ML PO SUSP
30.0000 mL | ORAL | Status: DC | PRN
  Administered 2020-06-12: 30 mL via ORAL
  Filled 2020-06-03: qty 30

## 2020-06-03 MED ORDER — ACETAMINOPHEN 325 MG PO TABS
650.0000 mg | ORAL_TABLET | Freq: Four times a day (QID) | ORAL | Status: DC | PRN
  Administered 2020-06-03 – 2020-06-14 (×25): 650 mg via ORAL
  Filled 2020-06-03 (×26): qty 2

## 2020-06-03 MED ORDER — ACETAMINOPHEN 325 MG PO TABS
650.0000 mg | ORAL_TABLET | Freq: Once | ORAL | Status: AC
  Administered 2020-06-03: 650 mg via ORAL
  Filled 2020-06-03: qty 2

## 2020-06-03 MED ORDER — MAGNESIUM HYDROXIDE 400 MG/5ML PO SUSP
30.0000 mL | Freq: Every day | ORAL | Status: DC | PRN
  Administered 2020-06-05: 30 mL via ORAL
  Filled 2020-06-03 (×2): qty 30

## 2020-06-03 NOTE — ED Notes (Signed)
Patient came out of room and went to bathroom and made a statement saying she wanted water. Patient went in room and came back to the door singing " she wants water she wants water" Patient said I don't care if you look at me stupid I want water. Patient was getting loud saying no one has checked in on her since she been here. Patient was given water and went back into patient room.

## 2020-06-03 NOTE — Consult Note (Signed)
[facility] Face-to-Face Psychiatry Consult   Reason for Consult: Suicidal Ideation Referring Physician: Dr. Larinda Buttery Patient Identification: [name] MRN:  [id number] Principal Diagnosis: <principal problem not specified> Diagnosis:  Active Problems:   Self-inflicted laceration of wrist (HCC)   MDD (major depressive disorder)   Tobacco use disorder   Stimulant use disorder (HCC) (cocaine)   Alcohol use disorder, moderate, dependence (HCC)   Pregnant   Medically noncompliant   Cocaine use disorder, moderate, dependence (HCC)   Cannabis use disorder, moderate, dependence (HCC)   Thoughts of self harm   MDD (major depressive disorder), recurrent episode, severe (HCC)   Bipolar disorder, curr episode mixed, severe, with psychotic features (HCC)   Fall   Total Time spent with patient: 30 minutes  Subjective:  "I have been drinking and using cocaine.  I am suicidal" [name] is a 34 y.o. female patient presented to [facility] ED via EMS voicing she is depressed and having suicidal thoughts. The patient reports feeling suicidal; she voiced she had six shots and cocaine tonight (06.09.2021). She's about [redacted] weeks pregnant and has not seen obstetrics as of yet.  The patient was seen face-to-face by this provider; the chart was reviewed and consulted with Dr. Larinda Buttery on 06/03/2020 due to the patient's care. It was discussed with the EDP that the patient would remain under observation overnight and reassess in the a.m. to determine if she meets psychiatric inpatient admission or she can be discharged home.  The patient is alert and oriented x 4, calm, cooperative, and mood-congruent with affect evaluation. The patient does not appear to be responding to internal or external stimuli. The patient is not presenting with delusional thinking. The patient denies auditory or visual hallucinations. The patient admits to suicidal ideation without a plan but denies homicidal ideations. The patient is not  presenting with any psychotic or paranoid behaviors. During an encounter with the patient, she was able to answer questions appropriately.  Plan: The patient will remain under observation overnight and reassess in the a.m. to determine if she meets psychiatric inpatient admission or she can be discharged home.   HPI:  Per Dr. Larinda Buttery: [name] is a 34 y.o. female, G6 P3-1-1-3 at approximately 26 weeks of pregnancy, with history of depression and substance abuse, who presents to the ED complaining of suicidal ideation.  Patient reports that she has been feeling increasingly depressed recently and is having thoughts of harming herself.  She denies any specific plan, but does admit she has been drinking alcohol and using drugs.  She reports having "a few shots" tonight and has been drinking intermittently during this pregnancy.  She also admits to recent cocaine use, which she has been snorting.  She denies any abdominal pain or vaginal bleeding, has recently been feeling baby moving.  She denies any medical complaints at this time.  Past Psychiatric History:  Anxiety Depression History of substance abuse (HCC) History of suicide attempt  Risk to Self: Suicidal Ideation: Yes-Currently Present Suicidal Intent: Yes-Currently Present Is patient at risk for suicide?: Yes Suicidal Plan?: Yes-Currently Present Specify Current Suicidal Plan: "To cut myself" Access to Means: Yes Specify Access to Suicidal Means: Access to sharp objects What has been your use of drugs/alcohol within the last 12 months?: Cocaine, Alcohol How many times?: 4 Other Self Harm Risks: Cutting Triggers for Past Attempts: Unknown Intentional Self Injurious Behavior: Cutting Comment - Self Injurious Behavior: Patient has a history of cutting Risk to Others: Homicidal Ideation: No Thoughts of  Harm to Others: No Current Homicidal Intent: No Current Homicidal Plan: No Access to Homicidal Means: No Identified Victim:  None History of harm to others?: No Assessment of Violence: None Noted Violent Behavior Description: None Does patient have access to weapons?: No Criminal Charges Pending?: No Does patient have a court date: No Prior Inpatient Therapy: Prior Inpatient Therapy: Yes Prior Therapy Dates: January 2021 and prior Prior Therapy Facilty/Provider(s): [facility], [facility] Reason for Treatment: Depression and  anxiety Prior Outpatient Therapy: Prior Outpatient Therapy: No Does patient have an ACCT team?: No Does patient have Intensive In-House Services?  : No Does patient have [facility] services? : No Does patient have P4CC services?: No  Past Medical History:  Past Medical History:  Diagnosis Date  . Anxiety   . Depression   . History of substance abuse (HCC)   . History of suicide attempt   . History of thyroid disease   . Hypertension   . Thyroid disease     Past Surgical History:  Procedure Laterality Date  . CESAREAN SECTION  2000, 2002, 2013   Family History:  Family History  Problem Relation Age of Onset  . Diabetes Mother    Family Psychiatric  History:  Social History:  Social History   Substance and Sexual Activity  Alcohol Use Yes   Comment: social drinker     Social History   Substance and Sexual Activity  Drug Use Not Currently  . Types: Cocaine, Marijuana   Comment: Positive UDS 12/2017    Social History   Socioeconomic History  . Marital status: Single    Spouse name: Not on file  . Number of children: Not on file  . Years of education: Not on file  . Highest education level: Not on file  Occupational History  . Not on file  Tobacco Use  . Smoking status: Current Every Day Smoker    Packs/day: 0.50  . Smokeless tobacco: Never Used  Substance and Sexual Activity  . Alcohol use: Yes    Comment: social drinker  . Drug use: Not Currently    Types: Cocaine, Marijuana    Comment: Positive UDS 12/2017  . Sexual activity: Not Currently    Birth  control/protection: None  Other Topics Concern  . Not on file  Social History Narrative  . Not on file   Social Determinants of Health   Financial Resource Strain:   . Difficulty of Paying Living Expenses:   Food Insecurity:   . Worried About Programme researcher, broadcasting/film/video in the Last Year:   . Barista in the Last Year:   Transportation Needs:   . Freight forwarder (Medical):   Marland Kitchen Lack of Transportation (Non-Medical):   Physical Activity:   . Days of Exercise per Week:   . Minutes of Exercise per Session:   Stress:   . Feeling of Stress :   Social Connections:   . Frequency of Communication with Friends and Family:   . Frequency of Social Gatherings with Friends and Family:   . Attends Religious Services:   . Active Member of Clubs or Organizations:   . Attends Banker Meetings:   Marland Kitchen Marital Status:    Additional Social History:    Allergies:   Allergies  Allergen Reactions  . Aspirin Anaphylaxis, Diarrhea and Nausea And Vomiting  . Banana Anaphylaxis, Itching, Shortness Of Breath and Swelling  . Other Itching, Shortness Of Breath and Swelling  . Peanut Oil Itching, Shortness Of Breath  and Swelling  . Peanut-Containing Drug Products Shortness Of Breath, Itching and Swelling  . Pecan Nut (Diagnostic) Anaphylaxis    Labs:  Results for orders placed or performed during the hospital encounter of 06/02/20 (from the past 48 hour(s))  Basic metabolic panel     Status: Abnormal   Collection Time: 06/02/20 11:56 PM  Result Value Ref Range   Sodium 137 135 - 145 mmol/L   Potassium 3.2 (L) 3.5 - 5.1 mmol/L   Chloride 107 98 - 111 mmol/L   CO2 21 (L) 22 - 32 mmol/L   Glucose, Bld 90 70 - 99 mg/dL    Comment: Glucose reference range applies only to samples taken after fasting for at least 8 hours.   BUN 8 6 - 20 mg/dL   Creatinine, Ser 0.43 (L) 0.44 - 1.00 mg/dL   Calcium 8.2 (L) 8.9 - 10.3 mg/dL   GFR calc non Af Amer >60 >60 mL/min   GFR calc Af Amer >60  >60 mL/min   Anion gap 9 5 - 15    Comment: Performed at [facility], [address] [place], [place] [address]  CBC with Differential     Status: Abnormal   Collection Time: 06/02/20 11:56 PM  Result Value Ref Range   WBC 9.5 4.0 - 10.5 K/uL   RBC 3.19 (L) 3.87 - 5.11 MIL/uL   Hemoglobin 9.5 (L) 12.0 - 15.0 g/dL   HCT 28.4 (L) 36 - 46 %   MCV 89.0 80.0 - 100.0 fL   MCH 29.8 26.0 - 34.0 pg   MCHC 33.5 30.0 - 36.0 g/dL   RDW 14.5 11.5 - 15.5 %   Platelets 187 150 - 400 K/uL   nRBC 0.0 0.0 - 0.2 %   Neutrophils Relative % 51 %   Neutro Abs 4.8 1.7 - 7.7 K/uL   Lymphocytes Relative 31 %   Lymphs Abs 3.0 0.7 - 4.0 K/uL   Monocytes Relative 6 %   Monocytes Absolute 0.6 0 - 1 K/uL   Eosinophils Relative 11 %   Eosinophils Absolute 1.0 (H) 0 - 0 K/uL   Basophils Relative 0 %   Basophils Absolute 0.0 0 - 0 K/uL   Immature Granulocytes 1 %   Abs Immature Granulocytes 0.07 0.00 - 0.07 K/uL    Comment: Performed at [facility], [address] [place], [place] [address]  Ethanol     Status: Abnormal   Collection Time: 06/02/20 11:56 PM  Result Value Ref Range   Alcohol, Ethyl (B) 89 (H) <10 mg/dL    Comment: (NOTE) Lowest detectable limit for serum alcohol is 10 mg/dL. For medical purposes only. Performed at [facility], [address] [place], [place] [address]   Salicylate level     Status: Abnormal   Collection Time: 06/02/20 11:56 PM  Result Value Ref Range   Salicylate Lvl <1.5 (L) 7.0 - 30.0 mg/dL    Comment: Performed at [facility], [address] [place],  [address]  Acetaminophen level     Status: Abnormal   Collection Time: 06/02/20 11:56 PM  Result Value Ref Range   Acetaminophen (Tylenol), Serum <10 (L) 10 - 30 ug/mL    Comment: (NOTE) Therapeutic concentrations vary significantly. A range of 10-30 ug/mL  may be an effective concentration for many patients. However, some  are best treated at concentrations  outside of this range. Acetaminophen concentrations >150 ug/mL at 4 hours after ingestion  and >50 ug/mL at 12  hours after ingestion are often associated with  toxic reactions.  Performed at [facility], [address] [place], [place] [address]     No current facility-administered medications for this encounter.   Current Outpatient Medications  Medication Sig Dispense Refill  . sertraline (ZOLOFT) 50 MG tablet Take 1 tablet (50 mg total) by mouth daily. 30 tablet 0    Musculoskeletal: Strength & Muscle Tone: within normal limits Gait & Station: normal Patient leans: N/A  Psychiatric Specialty Exam: Physical Exam  Nursing note and vitals reviewed. Constitutional: She is oriented to person, place, and time.  Cardiovascular: Normal rate.  Respiratory: Effort normal.  Musculoskeletal:        General: Normal range of motion.     Cervical back: Normal range of motion and neck supple.  Neurological: She is alert and oriented to person, place, and time.    Review of Systems  Psychiatric/Behavioral: Positive for agitation and self-injury. The patient is nervous/anxious.   All other systems reviewed and are negative.   Blood pressure 136/72, pulse 76, temperature 98.1 F (36.7 C), temperature source Oral, resp. rate 18, last menstrual period 12/04/2019, SpO2 99 %.There is no height or weight on file to calculate BMI.  General Appearance: Casual  Eye Contact:  Absent  Speech:  Clear and Coherent  Volume:  Normal  Mood:  Irritable  Affect:  Appropriate  Thought Process:  Coherent  Orientation:  Full (Time, Place, and Person)  Thought Content:  WDL  Suicidal Thoughts:  Yes.  without intent/plan  Homicidal Thoughts:  No  Memory:  Immediate;   Fair Recent;   Fair Remote;   Fair  Judgement:  Impaired  Insight:  Lacking  Psychomotor Activity:  Normal  Concentration:  Concentration: Fair and Attention Span: Fair  Recall:  [employer] of Knowledge:  Fair   Language:  Fair  Akathisia:  Negative  Handed:  Right  AIMS (if indicated):     Assets:  Communication Skills Desire for Improvement Financial Resources/Insurance Physical Health Resilience Social Support  ADL's:  Intact  Cognition:  WNL  Sleep:        Treatment Plan Summary: Daily contact with patient to assess and evaluate symptoms and progress in treatment, Medication management and Plan The patient will remain on the observation overnight and reassess in the a.m. to determine if she meets criteria for psychiatric inpatient admission or she could be discharged home.  Disposition: Supportive therapy provided about ongoing stressors.  Gillermo Murdoch, NP 06/03/2020 5:57 AM

## 2020-06-03 NOTE — ED Notes (Signed)
Patient dressed out by this Clinical research associate and ED tech.  1- black and white dress 1 pair of sandals  Belongings placed in bag and labeled.

## 2020-06-03 NOTE — BH Assessment (Signed)
Patient can come down after 7:30pm  Patient is to be admitted to [facility] BMU by Dr. Toni Amend.  Attending Physician will be. Dr. Toni Amend.   Patient has been assigned to room 324, by [facility] Charge Nurse Lindie Spruce, RN.   Intake Paper Work has been signed and placed on patient chart.  ER staff is aware of the admission: 1. Carlisle Beers, ER Secretary  2. Darnelle Catalan, ER MD  3. Geralynn Ochs, Patient's Nurse  4. [facility] Patient Access.

## 2020-06-03 NOTE — Progress Notes (Signed)
Patient was admitted from [facility] - ED, report received from [place], [place]. Patient pleasant upon assessment but presents with a sad/depressed affect. Patient denies SI/HI/AVH and pain. Patient endorses anxiety and depression stating her depression comes from her mother passing away recently and she found her in her home. Another stressor for her is that she has lost custody of her kids and she feels isolated and alone. Patient is well known to our unit and was oriented to her room and the unit. Patient went to wrapup group and got snack. Patient is [redacted] weeks pregnant. Patient being monitored Q 15 minutes for safety and remains safe on the unit.

## 2020-06-03 NOTE — BH Assessment (Signed)
Assessment Note  [name] is an 34 y.o. female presenting to [facility] ED via EMS. Per triage note Patient arrived by [facility] EMS. Per patient she is depressed and having SI. Prior to arrival to ER patient had 4-5 shot of vodka. Patient admits to using cocaine. Patient currently [redacted] weeks pregnant. Patient states she has been feeling fetal movements. During assessment patient was alert and oriented x4, irritable and withdrawn, did not want to face psychiatric team and turned over in her bed. When asked why patient was presenting to ED she reports "I feel depressed." Patient reported SI with a plan "to cut myself." When asked if patient followed up with the outpatient provider appointment that was made her for patient denied. Patient also denies seeking prenatal care for her current pregnancy. Patient reports recent substance abuse tonight and reports using alcohol and cocaine. Patient BAL is 89 and no current UDS is available. Patient became irritable during assessment and asked "why ya'll keep asking me the same damn questions."   Per Psyc NP patient will be observed overnight and reassessed in the morning.   Diagnosis: Depression, Substance Use Disorder  Past Medical History:  Past Medical History:  Diagnosis Date  . Anxiety   . Depression   . History of substance abuse (HCC)   . History of suicide attempt   . History of thyroid disease   . Hypertension   . Thyroid disease     Past Surgical History:  Procedure Laterality Date  . CESAREAN SECTION  2000, 2002, 2013    Family History:  Family History  Problem Relation Age of Onset  . Diabetes Mother     Social History:  reports that she has been smoking. She has been smoking about 0.50 packs per day. She has never used smokeless tobacco. She reports current alcohol use. She reports previous drug use. Drugs: Cocaine and Marijuana.  Additional Social History:  Alcohol / Drug Use Pain Medications: See MAR Prescriptions: See  MAR Over the Counter: See MAR History of alcohol / drug use?: Yes Substance #1 Name of Substance 1: Alcohol Substance #2 Name of Substance 2: Cocaine  CIWA: CIWA-Ar BP: 136/72 Pulse Rate: 76 COWS:    Allergies:  Allergies  Allergen Reactions  . Aspirin Anaphylaxis, Diarrhea and Nausea And Vomiting  . Banana Anaphylaxis, Itching, Shortness Of Breath and Swelling  . Other Itching, Shortness Of Breath and Swelling  . Peanut Oil Itching, Shortness Of Breath and Swelling  . Peanut-Containing Drug Products Shortness Of Breath, Itching and Swelling  . Pecan Nut (Diagnostic) Anaphylaxis    Home Medications: (Not in a hospital admission)   OB/GYN Status:  Patient's last menstrual period was 12/04/2019 (within months).  General Assessment Data Location of Assessment: [facility] ED TTS Assessment: In system Is this a Tele or Face-to-Face Assessment?: Face-to-Face Is this an Initial Assessment or a Re-assessment for this encounter?: Initial Assessment Patient Accompanied by:: N/A Language Other than English: No Living Arrangements: Other (Comment) (Private Residence) What gender do you identify as?: Female Marital status: Single Pregnancy Status: Yes (Comment: include estimated delivery date) Living Arrangements: Alone Can pt return to current living arrangement?: Yes Admission Status: Involuntary Petitioner: ED Attending Is patient capable of signing voluntary admission?: No Referral Source: Other Insurance type: Medicaid  Medical Screening Exam [facility] Walk-in ONLY) Medical Exam completed: Yes  Crisis Care Plan Living Arrangements: Alone Legal Guardian: Other: (Self) Name of Psychiatrist: None Name of Therapist: None  Education Status Is patient currently in school?: No  Is the patient employed, unemployed or receiving disability?: Unemployed  Risk to self with the past 6 months Suicidal Ideation: Yes-Currently Present Has patient been a risk to self within the past 6  months prior to admission? : Yes Suicidal Intent: Yes-Currently Present Has patient had any suicidal intent within the past 6 months prior to admission? : Yes Is patient at risk for suicide?: Yes Suicidal Plan?: Yes-Currently Present Has patient had any suicidal plan within the past 6 months prior to admission? 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 : No Thoughts of Harm to Others: No Current Homicidal Intent: No Current Homicidal Plan: No Access to Homicidal Means: No Identified Victim: None History of harm to others?: No Assessment of Violence: None Noted Violent Behavior Description: None Does patient have access to weapons?: No Criminal Charges Pending?: No Does patient have a court date: No Is patient on probation?: No  Psychosis Hallucinations: None noted Delusions: None noted  Mental Status Report Appearance/Hygiene: In scrubs Eye Contact: Poor Motor Activity: Freedom  of movement Speech: Logical/coherent Level of Consciousness: Alert, Irritable Mood: Depressed, Irritable Affect: Depressed, Irritable Anxiety Level: Minimal Thought Processes: Coherent Judgement: Unimpaired Orientation: Place, Person, Time, Situation, Appropriate for developmental age Obsessive Compulsive Thoughts/Behaviors: None  Cognitive Functioning Concentration: Normal Memory: Recent Intact, Remote Intact Is patient IDD: No Insight: Poor Impulse Control: Fair Appetite: Fair Have you had any weight changes? : No Change Sleep: Decreased Total Hours of Sleep: 0 Vegetative Symptoms: None  ADLScreening [facility] Assessment Services) Patient's cognitive ability adequate to safely complete daily activities?: Yes Patient able to express need for assistance with ADLs?: Yes Independently performs ADLs?: Yes (appropriate for developmental age)  Prior Inpatient Therapy Prior Inpatient Therapy: Yes Prior Therapy Dates: January 2021 and prior Prior Therapy Facilty/Provider(s): [facility], [facility] Reason for Treatment: Depression and  anxiety  Prior Outpatient Therapy Prior Outpatient Therapy: No Does patient have an ACCT team?: No Does patient have Intensive In-House Services?  : No Does patient have [facility] services? : No Does patient have P4CC services?: No  ADL Screening (condition at time of admission) Patient's cognitive ability adequate to safely complete daily activities?: Yes Is the patient deaf or have difficulty hearing?: No Does the patient have difficulty seeing, even when wearing glasses/contacts?: No Does the patient have difficulty concentrating, remembering, or making decisions?: No Patient able to express need for assistance with ADLs?: Yes Does the patient have difficulty dressing or bathing?: No Independently performs ADLs?: Yes (appropriate for developmental age) Does the patient have difficulty walking or climbing stairs?: No Weakness of Legs: None Weakness of  Arms/Hands: None  Home Assistive Devices/Equipment Home Assistive Devices/Equipment: None  Therapy Consults (  therapy consults require a physician order) PT Evaluation Needed: No OT Evalulation Needed: No SLP Evaluation Needed: No Abuse/Neglect Assessment (Assessment to be complete while patient is alone) Physical Abuse: Denies Verbal Abuse: Denies Sexual Abuse: Denies Exploitation of patient/patient's resources: Denies Self-Neglect: Denies Values / Beliefs Cultural Requests During Hospitalization: None Spiritual Requests During Hospitalization: None Consults Spiritual Care Consult Needed: No Transition of Care Team Consult Needed: No Advance Directives (For Healthcare) Does Patient Have a Medical Advance Directive?: No Would patient like information on creating a medical advance directive?: No - Patient declined          Disposition: Per Psyc NP patient will be observed overnight and reassessed in the morning. Disposition Initial Assessment Completed for this Encounter: Yes Disposition of Patient:  (Observe Overnight and Reassess in the morning)  On Site Evaluation by:   Reviewed with Physician:    Benay Pike MS LCASA 06/03/2020 3:39 AM

## 2020-06-03 NOTE — Plan of Care (Signed)
Patient new to the unit, hasn't had time to progress  Problem: Education: Goal: Knowledge of [facility] Education information/materials will improve Outcome: Not Progressing Goal: Emotional status will improve Outcome: Not Progressing Goal: Mental status will improve Outcome: Not Progressing Goal: Verbalization of understanding the information provided will improve Outcome: Not Progressing   Problem: Safety: Goal: Periods of time without injury will increase Outcome: Not Progressing   Problem: Education: Goal: Utilization of techniques to improve thought processes will improve Outcome: Not Progressing Goal: Knowledge of the prescribed therapeutic regimen will improve Outcome: Not Progressing   Problem: Safety: Goal: Ability to disclose and discuss suicidal ideas will improve Outcome: Not Progressing Goal: Ability to identify and utilize support systems that promote safety will improve Outcome: Not Progressing   

## 2020-06-03 NOTE — [facility] Group Notes (Signed)
[facility] Group Notes:  (Nursing/MHT/Case Management/Adjunct)  Date:  06/03/2020  Time:  9:31 PM  Type of Therapy:  Group Therapy  Participation Level:  Active  Participation Quality:  Appropriate  Affect:  Appropriate  Cognitive:  Alert  Insight:  Good  Engagement in Group:  Engaged  Modes of Intervention:  Support  Summary of Progress/Problems:  [name] 06/03/2020, 9:31 PM

## 2020-06-03 NOTE — ED Notes (Signed)
Pt transferred into ED BHU room 1    Patient assigned to appropriate care area. Patient oriented to unit/care area: Informed that, for her safety, care areas are designed for safety and monitored by security cameras at all times; Visiting hours and phone times explained to patient. Patient verbalizes understanding, and verbal contract for safety obtained.  Assessment completed  She denies pain    

## 2020-06-03 NOTE — ED Triage Notes (Signed)
Patient arrived by [facility]. Per patient she is depressed and having SI. Prior to arrival to ER patient had 4-5 shot of vodka. Patient admits to using cocaine. Patient currently [redacted] weeks pregnant. Patient states she has been feeling fetal movements.

## 2020-06-03 NOTE — Tx Team (Signed)
Initial Treatment Plan 06/03/2020 8:52 PM [name] KQA:[id number]    PATIENT STRESSORS: Marital or family conflict Substance abuse   PATIENT STRENGTHS: Capable of independent living Motivation for treatment/growth   PATIENT IDENTIFIED PROBLEMS: Depression  Suicidal Ideation  Anxiety                 DISCHARGE CRITERIA:  Motivation to continue treatment in a less acute level of care Verbal commitment to aftercare and medication compliance  PRELIMINARY DISCHARGE PLAN: Outpatient therapy Return to previous living arrangement  PATIENT/FAMILY INVOLVEMENT: This treatment plan has been presented to and reviewed with the patient, [name]. The patient has been given the opportunity to ask questions and make suggestions.  Elmyra Ricks, RN 06/03/2020, 8:52 PM

## 2020-06-03 NOTE — Final Progress Note (Signed)
Physician Final Progress Note  Patient ID: [name] MRN: [id number] DOB/AGE: 31-Mar-1986 34 y.o.  Admit date: 06/02/2020 Admitting provider:  Dr Tona Sensing Discharge date: 06/03/2020   Admission Diagnoses:bipolar mixed with psychosis  Generalized anxiety   Adjustment disorder mixed   Pregnancy ---6 months   ETOh dependence and abuse Cocaine Dependence   Other past polysubstance dependence    Discharge Diagnoses:  Active Problems:   Self-inflicted laceration of wrist (HCC)   MDD (major depressive disorder)   Tobacco use disorder   Stimulant use disorder (HCC) (cocaine)   Alcohol use disorder, moderate, dependence (HCC)   Pregnant   Medically noncompliant   Cocaine use disorder, moderate, dependence (HCC)   Cannabis use disorder, moderate, dependence (HCC)   Thoughts of self harm   MDD (major depressive disorder), recurrent episode, severe (HCC)   Bipolar disorder, curr episode mixed, severe, with psychotic features (HCC)   Fall    Consults:  ED  PSYCH  OB Gyn pending    Significant Findings/ Diagnostic Studies:   Previously in ER 12 days recently for similar issues.  Returned home but has ongoing problems with cocaine use, ETOh use and adjustment issues living along  CPS and DSS have her other children and she still has not communicated with them.   Admitted for worsening depression, mood swings, ups and downs, lability highs and lows, along with major depression and generalized anxiety   Brief MS    Alert dysphoric, dramatic, tearful, upset forlorn, feels suicidal with unknown plan   Mood --anxious and depressed  Speech loud pressured   Thought process and content --possibly internally distracted, hears voices, /--still insists her mom passed away in front of her --but not clear if this is delusional   This was of issue last time.   Unclear safety margin --not contracting fully for safety   OB gyn reconsult pending from ER      Procedures:   None  Discharge Condition: fair to poor  Transferred from ER to behav psych inpatient unit   Disposition:  There are no questions and answers to display.        Diet: Regular diet  Discharge Activity: {as tolerated    Total time spent taking care of this patient: 40-50 minutes  Signed: Roselind Messier 06/03/2020, 3:49 PM

## 2020-06-03 NOTE — ED Notes (Signed)
Patient threw her breakfast out in hallway, and said she's having a baby what's this.

## 2020-06-03 NOTE — ED Notes (Signed)
Patient was given 2 lunch trays one with a chicken sandwich, patient said it was too spicy and did not want it. Then patient was given a [place] sub and stated "who eats cold food" I need to be able to pick my own food. It was explained to her that we are in the ED and are not able to pick our food. Writer called to dining area and they sent patient up a meal with roast beef and gravy, mashed potatoes and carrots, patient then stated she

## 2020-06-03 NOTE — BH Assessment (Addendum)
TTS completed reassessment. Pt presented moderately irritable but calm with a depressed mood and oriented x 3. Pt reports feeling depressed due to her lack of contact with her children, living alone and conflict with baby father. Pt confirmed using "a little bit" of cocaine and taking a "few shots" of alcohol yesterday. Pt expressed to understand the dangers of her SA to self and baby but tearfully stated "I don't know what else to do". Pt denied any current SI/HI/AH/VH and is unable to contract for safety stating 'I don't know if I can handle being by myself".   Per Dr. Smith Robert pt meets criteria for inpatient treatment.

## 2020-06-03 NOTE — ED Notes (Signed)
Patient observed lying in bed with eyes closed  Even, unlabored respirations observed   NAD pt appears to be sleeping will continue to monitor along with every 15 minute visual observations and ongoing security monitoring    

## 2020-06-04 DIAGNOSIS — F332 Major depressive disorder, recurrent severe without psychotic features: Secondary | ICD-10-CM

## 2020-06-04 NOTE — Progress Notes (Signed)
Recreation Therapy Notes  INPATIENT RECREATION THERAPY ASSESSMENT  Patient Details Name: [name] MRN: [id number] DOB: 10-14-1986 Today's Date: 06/04/2020       Information Obtained From: Patient (Patient refused stated that she is too tired.)  Able to Participate in Assessment/Interview:    Patient Presentation:    Reason for Admission (Per Patient):    Patient Stressors:    Coping Skills:      Leisure Interests (2+):     Frequency of Recreation/Participation:    Awareness of Community Resources:     [employer]:     Current Use:    If no, Barriers?:    Expressed Interest in [employer] Information:    [place] of Residence:     Patient Main Form of Transportation:    Patient Strengths:     Patient Identified Areas of Improvement:     Patient Goal for Hospitalization:     Current SI (including self-harm):     Current HI:     Current AVH:    Staff Intervention Plan:    Consent to Intern Participation:    Clarabell Matsuoka 06/04/2020, 2:37 PM

## 2020-06-04 NOTE — H&P (Signed)
Psychiatric Admission Assessment Adult  Patient Identification: [name] MRN:  [id number] Date of Evaluation:  06/04/2020 Chief Complaint:  Severe recurrent major depression without psychotic features (HCC) [F33.2] Principal Diagnosis: Severe recurrent major depression without psychotic features (HCC) Diagnosis:  Principal Problem:   Severe recurrent major depression without psychotic features (HCC) Active Problems:   Stimulant use disorder (HCC) (cocaine)   Alcohol use disorder, moderate, dependence (HCC)   Pregnant   Cocaine use disorder, moderate, dependence (HCC)  History of Present Illness: Patient seen chart reviewed.  Patient known from previous encounters.  34 year old woman with a history of depression and substance abuse comes to the emergency room reporting suicidal ideation with thoughts of overdosing or cutting herself.  Patient was very withdrawn and irritable in the ER and was intoxicated at the time.  On interview today the patient says she has been doing very badly.  She says that her mother died and she, the patient, found her mother's body and that was just a week or 2 ago.  Since then the patient has been of course doing even worse emotionally.  Staying by herself.  Says she has no other support.  She has been using alcohol regularly as well as cocaine and amphetamines and cannabis.  She had had "a few shots" before coming to the emergency room.  Patient continues to endorse suicidal thoughts today.  Denies any current hallucinations.  She had not been compliant with recommended outpatient treatment after her last discharge.  Unclear to me whether she has been following up with any prenatal care since last hospitalization Associated Signs/Symptoms: Depression Symptoms:  depressed mood, anhedonia, feelings of worthlessness/guilt, difficulty concentrating, hopelessness, impaired memory, suicidal thoughts with specific plan, anxiety, (Hypo) Manic Symptoms:   Impulsivity, Anxiety Symptoms:  Social Anxiety, Psychotic Symptoms:  None reported PTSD Symptoms: Past history of various traumas not clear how much of it relates specifically to ongoing symptoms Total Time spent with patient: 1 hour  Past Psychiatric History: Patient has a past history of depression and substance abuse.  Multiple prior presentations.  Has a history of self injury and suicide attempts.  She has an established history as well as being noncompliant with outpatient treatment with frequent relapses into drug and alcohol use.  Is the patient at risk to self? Yes.    Has the patient been a risk to self in the past 6 months? Yes.    Has the patient been a risk to self within the distant past? Yes.    Is the patient a risk to others? No.  Has the patient been a risk to others in the past 6 months? No.  Has the patient been a risk to others within the distant past? No.   Prior Inpatient Therapy:   Prior Outpatient Therapy:    Alcohol Screening: 1. How often do you have a drink containing alcohol?: 2 to 4 times a month 2. How many drinks containing alcohol do you have on a typical day when you are drinking?: 3 or 4 3. How often do you have six or more drinks on one occasion?: Never AUDIT-C Score: 3 4. How often during the last year have you found that you were not able to stop drinking once you had started?: Never 5. How often during the last year have you failed to do what was normally expected from you because of drinking?: Never 6. How often during the last year have you needed a first drink in the morning to get yourself going  after a heavy drinking session?: Never 7. How often during the last year have you had a feeling of guilt of remorse after drinking?: Never 8. How often during the last year have you been unable to remember what happened the night before because you had been drinking?: Never 9. Have you or someone else been injured as a result of your drinking?: No 10.  Has a relative or friend or a doctor or another health worker been concerned about your drinking or suggested you cut down?: No Alcohol Use Disorder Identification Test Final Score (AUDIT): 3 Alcohol Brief Interventions/Follow-up: AUDIT Score <7 follow-up not indicated Substance Abuse History in the last 12 months:  Yes.   Consequences of Substance Abuse: Patient is pregnant and is aware of the dangers and risks involved with substance abuse while pregnant but has continued to abuse regularly. Previous Psychotropic Medications: Yes  Psychological Evaluations: Yes  Past Medical History:  Past Medical History:  Diagnosis Date  . Anxiety   . Depression   . History of substance abuse (HCC)   . History of suicide attempt   . History of thyroid disease   . Hypertension   . Thyroid disease     Past Surgical History:  Procedure Laterality Date  . CESAREAN SECTION  2000, 2002, 2013   Family History:  Family History  Problem Relation Age of Onset  . Diabetes Mother    Family Psychiatric  History: Substance abuse Tobacco Screening: Have you used any form of tobacco in the last 30 days? (Cigarettes, Smokeless Tobacco, Cigars, and/or Pipes): No Social History:  Social History   Substance and Sexual Activity  Alcohol Use Yes   Comment: social drinker     Social History   Substance and Sexual Activity  Drug Use Not Currently  . Types: Cocaine, Marijuana   Comment: Positive UDS 12/2017    Additional Social History:                           Allergies:   Allergies  Allergen Reactions  . Aspirin Anaphylaxis, Diarrhea and Nausea And Vomiting  . Banana Anaphylaxis, Itching, Shortness Of Breath and Swelling  . Other Itching, Shortness Of Breath and Swelling  . Peanut Oil Itching, Shortness Of Breath and Swelling  . Peanut-Containing Drug Products Shortness Of Breath, Itching and Swelling  . Pecan Nut (Diagnostic) Anaphylaxis   Lab Results:  Results for orders placed or  performed during the hospital encounter of 06/02/20 (from the past 48 hour(s))  Basic metabolic panel     Status: Abnormal   Collection Time: 06/02/20 11:56 PM  Result Value Ref Range   Sodium 137 135 - 145 mmol/L   Potassium 3.2 (L) 3.5 - 5.1 mmol/L   Chloride 107 98 - 111 mmol/L   CO2 21 (L) 22 - 32 mmol/L   Glucose, Bld 90 70 - 99 mg/dL    Comment: Glucose reference range applies only to samples taken after fasting for at least 8 hours.   BUN 8 6 - 20 mg/dL   Creatinine, Ser 2.33 (L) 0.44 - 1.00 mg/dL   Calcium 8.2 (L) 8.9 - 10.3 mg/dL   GFR calc non Af Amer >60 >60 mL/min   GFR calc Af Amer >60 >60 mL/min   Anion gap 9 5 - 15    Comment: Performed at [facility], [address], [place], [place] [address]  CBC with Differential     Status: Abnormal  Collection Time: 06/02/20 11:56 PM  Result Value Ref Range   WBC 9.5 4.0 - 10.5 K/uL   RBC 3.19 (L) 3.87 - 5.11 MIL/uL   Hemoglobin 9.5 (L) 12.0 - 15.0 g/dL   HCT 28.4 (L) 36 - 46 %   MCV 89.0 80.0 - 100.0 fL   MCH 29.8 26.0 - 34.0 pg   MCHC 33.5 30.0 - 36.0 g/dL   RDW 14.5 11.5 - 15.5 %   Platelets 187 150 - 400 K/uL   nRBC 0.0 0.0 - 0.2 %   Neutrophils Relative % 51 %   Neutro Abs 4.8 1.7 - 7.7 K/uL   Lymphocytes Relative 31 %   Lymphs Abs 3.0 0.7 - 4.0 K/uL   Monocytes Relative 6 %   Monocytes Absolute 0.6 0 - 1 K/uL   Eosinophils Relative 11 %   Eosinophils Absolute 1.0 (H) 0 - 0 K/uL   Basophils Relative 0 %   Basophils Absolute 0.0 0 - 0 K/uL   Immature Granulocytes 1 %   Abs Immature Granulocytes 0.07 0.00 - 0.07 K/uL    Comment: Performed at [facility], [address], [place], [place] [address]  Ethanol     Status: Abnormal   Collection Time: 06/02/20 11:56 PM  Result Value Ref Range   Alcohol, Ethyl (B) 89 (H) <10 mg/dL    Comment: (NOTE) Lowest detectable limit for serum alcohol is 10 mg/dL. For medical purposes only. Performed at [facility], [address]  [place], [place] [address]   Salicylate level     Status: Abnormal   Collection Time: 06/02/20 11:56 PM  Result Value Ref Range   Salicylate Lvl <7.5 (L) 7.0 - 30.0 mg/dL    Comment: Performed at [facility], [address] [place], [place] [address]  Acetaminophen level     Status: Abnormal   Collection Time: 06/02/20 11:56 PM  Result Value Ref Range   Acetaminophen (Tylenol), Serum <10 (L) 10 - 30 ug/mL    Comment: (NOTE) Therapeutic concentrations vary significantly. A range of 10-30 ug/mL  may be an effective concentration for many patients. However, some  are best treated at concentrations outside of this range. Acetaminophen concentrations >150 ug/mL at 4 hours after ingestion  and >50 ug/mL at 12 hours after ingestion are often associated with  toxic reactions.  Performed at [facility], [address] [place], [place] [address]     Blood Alcohol level:  Lab Results  Component Value Date   ETH 89 (H) 06/02/2020   ETH <10 41/66/0630    Metabolic Disorder Labs:  Lab Results  Component Value Date   HGBA1C 5.1 01/02/2018   MPG 99.67 01/02/2018   No results found for: PROLACTIN Lab Results  Component Value Date   CHOL 218 (H) 01/02/2018   TRIG 187 (H) 01/02/2018   HDL 81 01/02/2018   CHOLHDL 2.7 01/02/2018   VLDL 37 01/02/2018   LDLCALC 100 (H) 01/02/2018   LDLCALC 82 01/12/2016    Current Medications: Current Facility-Administered Medications  Medication Dose Route Frequency Provider Last Rate Last Admin  . acetaminophen (TYLENOL) tablet 650 mg  650 mg Oral Q6H PRN Lylla Eifler, Madie Reno, MD   650 mg at 06/03/20 2214  . alum & mag hydroxide-simeth (MAALOX/MYLANTA) 200-200-20 MG/5ML suspension 30 mL  30 mL Oral Q4H PRN Dilan Novosad T, MD      . magnesium hydroxide (MILK OF MAGNESIA) suspension 30 mL  30 mL Oral Daily PRN Maleeah Crossman  T, MD      . sertraline (ZOLOFT) tablet 50 mg  50 mg Oral Daily Wiley Magan, Jackquline Denmark, MD   50 mg at 06/04/20 1324    PTA Medications: Medications Prior to Admission  Medication Sig Dispense Refill Last Dose  . sertraline (ZOLOFT) 50 MG tablet Take 1 tablet (50 mg total) by mouth daily. 30 tablet 0     Musculoskeletal: Strength & Muscle Tone: within normal limits Gait & Station: normal Patient leans: N/A  Psychiatric Specialty Exam: Physical Exam  Nursing note and vitals reviewed. Constitutional: She appears well-developed.  HENT:  Head: Normocephalic and atraumatic.  Eyes: Pupils are equal, round, and reactive to light. Conjunctivae are normal.  Cardiovascular: Normal heart sounds.  Respiratory: Effort normal.  GI: Soft.  Musculoskeletal:        General: Normal range of motion.     Cervical back: Normal range of motion.  Neurological: She is alert.  Skin: Skin is warm and dry.  Psychiatric: Her affect is blunt. Her speech is delayed. Cognition and memory are impaired. She expresses impulsivity. She exhibits a depressed mood. She expresses suicidal ideation. She expresses no suicidal plans.    Review of Systems  Constitutional: Negative.   HENT: Negative.   Eyes: Negative.   Respiratory: Negative.   Cardiovascular: Negative.   Gastrointestinal: Negative.   Musculoskeletal: Negative.   Skin: Negative.   Neurological: Negative.   Psychiatric/Behavioral: Positive for dysphoric mood and suicidal ideas. The patient is nervous/anxious.     Blood pressure 115/80, pulse 75, temperature 98.4 F (36.9 C), temperature source Oral, resp. rate 17, height 4\' 11"  (1.499 m), weight 72.1 kg, last menstrual period 12/04/2019, SpO2 100 %.Body mass index is 32.11 kg/m.  General Appearance: Disheveled  Eye Contact:  Minimal  Speech:  Slow  Volume:  Decreased  Mood:  Depressed  Affect:  Depressed  Thought Process:  Coherent  Orientation:  Full (Time, Place, and Person)  Thought Content:  Logical  Suicidal Thoughts:  Yes.  with intent/plan  Homicidal Thoughts:  No  Memory:  Immediate;    Fair Recent;   Fair Remote;   Fair  Judgement:  Fair  Insight:  Fair  Psychomotor Activity:  Normal  Concentration:  Concentration: Fair  Recall:  14/09/2019 of Knowledge:  Fair  Language:  Fair  Akathisia:  No  Handed:  Right  AIMS (if indicated):     Assets:  Desire for Improvement  ADL's:  Impaired  Cognition:  Impaired,  Mild  Sleep:  Number of Hours: 8    Treatment Plan Summary: Daily contact with patient to assess and evaluate symptoms and progress in treatment, Medication management and Plan 34 year old woman with alcohol abuse cocaine abuse amphetamine and cannabis abuse and depression.  Currently very sad down and withdrawn with helplessness hopelessness and suicidal ideation.  She has been cooperating with eating meals and basic hygiene and treatment on the unit but otherwise stays pretty withdrawn into her self.  Plan will be to continue 15-minute checks.  Restart Zoloft.  If needed we can have OB/GYN follow-up with her again but at this point it seems like the pregnancy remained stable.  Engage patient in individual and group therapy regularly.  She is familiar with [facility] and they will be involved with her as well and trying to plan for appropriate disposition eventually.  Observation Level/Precautions:  15 minute checks  Laboratory:  UDS  Psychotherapy:    Medications:    Consultations:    Discharge Concerns:  Estimated LOS:  Other:     Physician Treatment Plan for Primary Diagnosis: Severe recurrent major depression without psychotic features (HCC) Long Term Goal(s): Improvement in symptoms so as ready for discharge  Short Term Goals: Ability to verbalize feelings will improve, Ability to disclose and discuss suicidal ideas and Ability to demonstrate self-control will improve  Physician Treatment Plan for Secondary Diagnosis: Principal Problem:   Severe recurrent major depression without psychotic features (HCC) Active Problems:   Stimulant use disorder (HCC)  (cocaine)   Alcohol use disorder, moderate, dependence (HCC)   Pregnant   Cocaine use disorder, moderate, dependence (HCC)  Long Term Goal(s): Improvement in symptoms so as ready for discharge  Short Term Goals: Ability to maintain clinical measurements within normal limits will improve and Compliance with prescribed medications will improve  I certify that inpatient services furnished can reasonably be expected to improve the patient's condition.    Mordecai Rasmussen, MD 6/11/20213:17 PM

## 2020-06-04 NOTE — [facility] Suicide Risk Assessment (Signed)
[facility] Admission Suicide Risk Assessment   Nursing information obtained from:  Patient Demographic factors:  Low socioeconomic status Current Mental Status:  Suicidal ideation indicated by patient Loss Factors:  Loss of significant relationship Historical Factors:  Impulsivity Risk Reduction Factors:  Positive therapeutic relationship  Total Time spent with patient: 1 hour Principal Problem: Severe recurrent major depression without psychotic features (HCC) Diagnosis:  Principal Problem:   Severe recurrent major depression without psychotic features (HCC) Active Problems:   Stimulant use disorder (HCC) (cocaine)   Alcohol use disorder, moderate, dependence (HCC)   Pregnant   Cocaine use disorder, moderate, dependence (HCC)  Subjective Data: Patient seen chart reviewed.  34 year old woman known well to the psychiatric service who came to the emergency room intoxicated reporting suicidal ideation.  Patient is about [redacted] weeks pregnant.  Currently on interview continues to endorse suicidal ideation without a specific plan.  Very depressed mood.  No evidence psychotic complaints  Continued Clinical Symptoms:  Alcohol Use Disorder Identification Test Final Score (AUDIT): 3 The "Alcohol Use Disorders Identification Test", Guidelines for Use in Primary Care, Second Edition.  World Science writer [facility]). Score between 0-7:  no or low risk or alcohol related problems. Score between 8-15:  moderate risk of alcohol related problems. Score between 16-19:  high risk of alcohol related problems. Score 20 or above:  warrants further diagnostic evaluation for alcohol dependence and treatment.   CLINICAL FACTORS:   Depression:   Comorbid alcohol abuse/dependence Alcohol/Substance Abuse/Dependencies   Musculoskeletal: Strength & Muscle Tone: within normal limits Gait & Station: normal Patient leans: N/A  Psychiatric Specialty Exam: Physical Exam  Nursing note and vitals  reviewed. Constitutional: She appears well-developed.  HENT:  Head: Normocephalic and atraumatic.  Eyes: Pupils are equal, round, and reactive to light. Conjunctivae are normal.  Cardiovascular: Normal heart sounds.  Respiratory: Effort normal.  GI: Soft.  Musculoskeletal:        General: Normal range of motion.     Cervical back: Normal range of motion.  Neurological: She is alert.  Skin: Skin is warm and dry.  Psychiatric: Her speech is delayed. She is slowed and withdrawn. She expresses impulsivity. She exhibits a depressed mood. She expresses suicidal ideation. She expresses suicidal plans. She is inattentive.    Review of Systems  Constitutional: Negative.   HENT: Negative.   Eyes: Negative.   Respiratory: Negative.   Cardiovascular: Negative.   Gastrointestinal: Negative.   Musculoskeletal: Negative.   Skin: Negative.   Neurological: Negative.   Psychiatric/Behavioral: Positive for dysphoric mood, sleep disturbance and suicidal ideas. The patient is nervous/anxious.     Blood pressure 115/80, pulse 75, temperature 98.4 F (36.9 C), temperature source Oral, resp. rate 17, height 4\' 11"  (1.499 m), weight 72.1 kg, last menstrual period 12/04/2019, SpO2 100 %.Body mass index is 32.11 kg/m.  General Appearance: Disheveled  Eye Contact:  Minimal  Speech:  Slow  Volume:  Decreased  Mood:  Depressed  Affect:  Congruent  Thought Process:  Coherent  Orientation:  Full (Time, Place, and Person)  Thought Content:  Logical, Rumination and Tangential  Suicidal Thoughts:  Yes.  without intent/plan  Homicidal Thoughts:  No  Memory:  Immediate;   Fair Recent;   Fair Remote;   Fair  Judgement:  Impaired  Insight:  Shallow  Psychomotor Activity:  Decreased  Concentration:  Concentration: Fair  Recall:  14/09/2019 of Knowledge:  Fair  Language:  Fair  Akathisia:  No  Handed:  Right  AIMS (if  indicated):     Assets:  Chief Executive Officer Physical Health Resilience   ADL's:  Impaired  Cognition:  Impaired,  Mild  Sleep:  Number of Hours: 8      COGNITIVE FEATURES THAT CONTRIBUTE TO RISK:  Closed-mindedness and None    SUICIDE RISK:   Moderate:  Frequent suicidal ideation with limited intensity, and duration, some specificity in terms of plans, no associated intent, good self-control, limited dysphoria/symptomatology, some risk factors present, and identifiable protective factors, including available and accessible social support.  PLAN OF CARE: Continue 15-minute checks.  Restart antidepressant.  Monitor for signs of withdrawal.  Engage in individual and group assessment and counseling.  Work on possible discharge planning  I certify that inpatient services furnished can reasonably be expected to improve the patient's condition.   Alethia Berthold, MD 06/04/2020, 3:13 PM

## 2020-06-04 NOTE — Plan of Care (Signed)
Pt refused to fill out self inventory sheet. Pt reports depression 8/10. Pt denies experiencing any pain at this time. Pt reports experiencing constipation however refuses medication to help with constipation.   Problem: Education: Goal: Knowledge of [place] [facility] Education information/materials will improve Outcome: Not Progressing Goal: Emotional status will improve Outcome: Not Progressing Goal: Mental status will improve Outcome: Not Progressing Goal: Verbalization of understanding the information provided will improve Outcome: Not Progressing   Problem: Safety: Goal: Periods of time without injury will increase Outcome: Not Progressing   Problem: Education: Goal: Utilization of techniques to improve thought processes will improve Outcome: Not Progressing Goal: Knowledge of the prescribed therapeutic regimen will improve Outcome: Not Progressing   Problem: Safety: Goal: Ability to disclose and discuss suicidal ideas will improve Outcome: Not Progressing Goal: Ability to identify and utilize support systems that promote safety will improve Outcome: Not Progressing

## 2020-06-04 NOTE — Progress Notes (Signed)
Recreation Therapy Notes  Date: 06/04/2020  Time: 9:30 am   Location: Craft room   Behavioral response: N/A   Intervention Topic: Self- Care    Discussion/Intervention: Patient did not attend group.   Clinical Observations/Feedback:  Patient did not attend group.   Navya Timmons LRT/CTRS         Latroya Ng 06/04/2020 12:17 PM

## 2020-06-05 DIAGNOSIS — O99342 Other mental disorders complicating pregnancy, second trimester: Principal | ICD-10-CM

## 2020-06-05 DIAGNOSIS — O99312 Alcohol use complicating pregnancy, second trimester: Secondary | ICD-10-CM

## 2020-06-05 DIAGNOSIS — O09212 Supervision of pregnancy with history of pre-term labor, second trimester: Secondary | ICD-10-CM

## 2020-06-05 DIAGNOSIS — Z3A26 26 weeks gestation of pregnancy: Secondary | ICD-10-CM

## 2020-06-05 DIAGNOSIS — O99322 Drug use complicating pregnancy, second trimester: Secondary | ICD-10-CM

## 2020-06-05 LAB — RETICULOCYTES
Immature Retic Fract: 18.3 % — ABNORMAL HIGH (ref 2.3–15.9)
RBC.: 3.24 MIL/uL — ABNORMAL LOW (ref 3.87–5.11)
Retic Count, Absolute: 102.7 10*3/uL (ref 19.0–186.0)
Retic Ct Pct: 3.2 % — ABNORMAL HIGH (ref 0.4–3.1)

## 2020-06-05 LAB — IRON AND TIBC
Iron: 65 ug/dL (ref 28–170)
Saturation Ratios: 16 % (ref 10.4–31.8)
TIBC: 414 ug/dL (ref 250–450)
UIBC: 349 ug/dL

## 2020-06-05 LAB — HEMOGLOBIN A1C
Hgb A1c MFr Bld: 5.5 % (ref 4.8–5.6)
Mean Plasma Glucose: 111.15 mg/dL

## 2020-06-05 LAB — FOLATE: Folate: 12.3 ng/mL (ref >5.9)

## 2020-06-05 LAB — VITAMIN B12: Vitamin B-12: 282 pg/mL (ref 180–914)

## 2020-06-05 LAB — FERRITIN: Ferritin: 27 ng/mL (ref 11–307)

## 2020-06-05 MED ORDER — POTASSIUM CHLORIDE CRYS ER 20 MEQ PO TBCR
20.0000 meq | EXTENDED_RELEASE_TABLET | Freq: Once | ORAL | Status: AC
  Administered 2020-06-05: 20 meq via ORAL
  Filled 2020-06-05: qty 1

## 2020-06-05 MED ORDER — PRENATAL MULTIVITAMIN CH
1.0000 | ORAL_TABLET | Freq: Every day | ORAL | Status: DC
  Administered 2020-06-05 – 2020-06-14 (×10): 1 via ORAL
  Filled 2020-06-05 (×10): qty 1

## 2020-06-05 NOTE — [facility] Suicide Risk Assessment (Signed)
[facility] INPATIENT:  Family/Significant Other Suicide Prevention Education  Suicide Prevention Education:  Patient Refusal for Family/Significant Other Suicide Prevention Education: The patient [name] has refused to provide written consent for family/significant other to be provided Family/Significant Other Suicide Prevention Education during admission and/or prior to discharge.  Physician notified.  Lorri Frederick, LCSW 06/05/2020, 11:17 AM

## 2020-06-05 NOTE — Progress Notes (Signed)
Patient alert and oriented x 4, affect is blunted thoughts are organized and coherent. Patient's denies SI/HI/AVH interacting appropriately with peers and staff no distress noted, she was receptive to staff and complaint with staff. 15 minutes safety checks maintained will continue to monitor.

## 2020-06-05 NOTE — Tx Team (Signed)
Interdisciplinary Treatment and Diagnostic Plan Update  06/05/2020 Time of Session: 0850 [name] MRN: [id number]  Principal Diagnosis: Severe recurrent major depression without psychotic features [facility])  Secondary Diagnoses: Principal Problem:   Severe recurrent major depression without psychotic features ([place]) Active Problems:   Stimulant use disorder (HCC) (cocaine)   Alcohol use disorder, moderate, dependence ([place])   Pregnant   Cocaine use disorder, moderate, dependence ([place])   Current Medications:  Current Facility-Administered Medications  Medication Dose Route Frequency Provider Last Rate Last Admin  . acetaminophen (TYLENOL) tablet 650 mg  650 mg Oral Q6H PRN Clapacs, Madie Reno, MD   650 mg at 06/05/20 0751  . alum & mag hydroxide-simeth (MAALOX/MYLANTA) 200-200-20 MG/5ML suspension 30 mL  30 mL Oral Q4H PRN Clapacs, John T, MD      . magnesium hydroxide (MILK OF MAGNESIA) suspension 30 mL  30 mL Oral Daily PRN Clapacs, Madie Reno, MD   30 mL at 06/05/20 0751  . sertraline (ZOLOFT) tablet 50 mg  50 mg Oral Daily Clapacs, Madie Reno, MD   50 mg at 06/05/20 0751   PTA Medications: Medications Prior to Admission  Medication Sig Dispense Refill Last Dose  . sertraline (ZOLOFT) 50 MG tablet Take 1 tablet (50 mg total) by mouth daily. 30 tablet 0     Patient Stressors: Marital or family conflict Substance abuse  Patient Strengths: Capable of independent living Motivation for treatment/growth  Treatment Modalities: Medication Management, Group therapy, Case management,  1 to 1 session with clinician, Psychoeducation, Recreational therapy.   Physician Treatment Plan for Primary Diagnosis: Severe recurrent major depression without psychotic features ([place]) Long Term Goal(s): Improvement in symptoms so as ready for discharge Improvement in symptoms so as ready for discharge   Short Term Goals: Ability to verbalize feelings will improve Ability to disclose and discuss suicidal  ideas Ability to demonstrate self-control will improve Ability to maintain clinical measurements within normal limits will improve Compliance with prescribed medications will improve  Medication Management: Evaluate patient's response, side effects, and tolerance of medication regimen.  Therapeutic Interventions: 1 to 1 sessions, Unit Group sessions and Medication administration.  Evaluation of Outcomes: Not Met  Physician Treatment Plan for Secondary Diagnosis: Principal Problem:   Severe recurrent major depression without psychotic features ([place]) Active Problems:   Stimulant use disorder (HCC) (cocaine)   Alcohol use disorder, moderate, dependence (HCC)   Pregnant   Cocaine use disorder, moderate, dependence ([place])  Long Term Goal(s): Improvement in symptoms so as ready for discharge Improvement in symptoms so as ready for discharge   Short Term Goals: Ability to verbalize feelings will improve Ability to disclose and discuss suicidal ideas Ability to demonstrate self-control will improve Ability to maintain clinical measurements within normal limits will improve Compliance with prescribed medications will improve     Medication Management: Evaluate patient's response, side effects, and tolerance of medication regimen.  Therapeutic Interventions: 1 to 1 sessions, Unit Group sessions and Medication administration.  Evaluation of Outcomes: Not Met   RN Treatment Plan for Primary Diagnosis: Severe recurrent major depression without psychotic features ([place]) Long Term Goal(s): Knowledge of disease and therapeutic regimen to maintain health will improve  Short Term Goals: Ability to identify and develop effective coping behaviors will improve and Compliance with prescribed medications will improve  Medication Management: RN will administer medications as ordered by provider, will assess and evaluate patient's response and provide education to patient for prescribed medication. RN  will report any adverse and/or side effects to  prescribing provider.  Therapeutic Interventions: 1 on 1 counseling sessions, Psychoeducation, Medication administration, Evaluate responses to treatment, Monitor vital signs and CBGs as ordered, Perform/monitor CIWA, COWS, AIMS and Fall Risk screenings as ordered, Perform wound care treatments as ordered.  Evaluation of Outcomes: Not Met   LCSW Treatment Plan for Primary Diagnosis: Severe recurrent major depression without psychotic features ([place]) Long Term Goal(s): Safe transition to appropriate next level of care at discharge, Engage patient in therapeutic group addressing interpersonal concerns.  Short Term Goals: Engage patient in aftercare planning with referrals and resources, Increase social support and Increase skills for wellness and recovery  Therapeutic Interventions: Assess for all discharge needs, 1 to 1 time with Social worker, Explore available resources and support systems, Assess for adequacy in community support network, Educate family and significant other(s) on suicide prevention, Complete Psychosocial Assessment, Interpersonal group therapy.  Evaluation of Outcomes: Not Met   Progress in Treatment: Attending groups: Yes. Participating in groups: Yes. Taking medication as prescribed: Yes. Toleration medication: Yes. Family/Significant other contact made: No, will contact:  pt declined consent Patient understands diagnosis: No. Discussing patient identified problems/goals with staff: Yes. Medical problems stabilized or resolved: Yes. Denies suicidal/homicidal ideation: Yes. Issues/concerns per patient self-inventory: No. Other: none  New problem(s) identified: No, Describe:  none  New Short Term/Long Term Goal(s):  Patient Goals:  "help with my depression"  Discharge Plan or Barriers:   Reason for Continuation of Hospitalization: Depression Medication stabilization  Estimated Length of Stay: 2-4  days.  Attendees: Patient:[name] 06/05/2020   Physician: Dr. Mallie Darting, MD 06/05/2020   Nursing: Al Corpus, RN 06/05/2020   RN Care Manager: 06/05/2020   Social Worker: Lurline Idol, LCSW 06/05/2020   Recreational Therapist:  06/05/2020   Other:  06/05/2020   Other:  06/05/2020  Other: 06/05/2020      Scribe for Treatment Team: Joanne Chars, LCSW 06/05/2020 11:11 AM

## 2020-06-05 NOTE — Consult Note (Signed)
Obstetrics & Gynecology Consult Note  Date of Consultation: 06/05/2020   Requesting Provider: Dr Landry Mellow, MD  Primary OBGYN: None Primary Care Provider: Patient, No Pcp Per  Reason for Consultation: Pregnancy, Depression, History of preterm delivery  History of Present Illness: Ms. Higginbotham is a 34 y.o. [id number] (Patient's last menstrual period was 12/04/2019 (within months).), with the above CC.  She is currently pregnant at 21 3/7 weeks based on LMP dates and confirmed by an earlier [place].  She has been admitted to Behavioral Medicine for depression, and substance abuse.    She has a history of Cesarean Section for her first delivery due to failure to progress, after she was found to be in labor prematurely.  She has had all subsequent Cesarean deliveries.  She has had one abortion.  One of her children died due to congenital defects.   Her oldest is 39 and on his own.. She does not have custody of her other children.  She has tajen 17 OHP for some of her pregnancies to prevent recurrent PTL/PTD.  She has been on it some this pregnancy but has missed several appointments and doses.  She says she receives care at [facility], and that they have referred her to [facility] for consultation and delivery planning, although she has not attended this appointment.  Most of her deliveries in past have been at [facility].     She is currently on Zoloft for depression, and reports feeling down, helpless, lethargic, and out of sorts.  She has positive results for Alcohol, Cocaine, Amphetamines, and Marijuana, and has a history of substance disorder with these drugs for many years.  She denies preeclampsia, diabetes, or other diagnoses in pregnancy.    Currently, the baby is moving well, she reports nausea at times and more recently bloody diarrhea, and that she takes her PNV without difficulty.   She was positive for Covid19 last month.  She was positive for gonorrhea 2 weeks ago.  ROS: A review of systems was performed  and was complete and comprehensive, except as stated in the above HPI.  OBGYN History: As per HPI. OB History    Gravida  7   Para  5   Term  4   Preterm  1   AB  1   Living  4     SAB  1   TAB      Ectopic      Multiple      Live Births  5        Obstetric Comments  Had a 35 year old that passed away         Past Medical History: Past Medical History:  Diagnosis Date  . Anxiety   . Depression   . History of substance abuse (HCC)   . History of suicide attempt   . History of thyroid disease   . Hypertension   . Thyroid disease     Past Surgical History: Past Surgical History:  Procedure Laterality Date  . CESAREAN SECTION  2000, 2002, 2013    Family History:  Family History  Problem Relation Age of Onset  . Diabetes Mother    She denies any female cancers, bleeding or blood clotting disorders.   Social History:  Social History   Socioeconomic History  . Marital status: Single    Spouse name: Not on file  . Number of children: Not on file  . Years of education: Not on file  . Highest education level:  Not on file  Occupational History  . Not on file  Tobacco Use  . Smoking status: Current Every Day Smoker    Packs/day: 0.50  . Smokeless tobacco: Never Used  Substance and Sexual Activity  . Alcohol use: Yes    Comment: social drinker  . Drug use: Not Currently    Types: Cocaine, Marijuana    Comment: Positive UDS 12/2017  . Sexual activity: Not Currently    Birth control/protection: None  Other Topics Concern  . Not on file  Social History Narrative  . Not on file   Social Determinants of Health   Financial Resource Strain:   . Difficulty of Paying Living Expenses:   Food Insecurity:   . Worried About Programme researcher, broadcasting/film/video in the Last Year:   . Barista in the Last Year:   Transportation Needs:   . Freight forwarder (Medical):   Marland Kitchen Lack of Transportation (Non-Medical):   Physical Activity:   . Days of Exercise per  Week:   . Minutes of Exercise per Session:   Stress:   . Feeling of Stress :   Social Connections:   . Frequency of Communication with Friends and Family:   . Frequency of Social Gatherings with Friends and Family:   . Attends Religious Services:   . Active Member of Clubs or Organizations:   . Attends Banker Meetings:   Marland Kitchen Marital Status:   Intimate Partner Violence:   . Fear of Current or Ex-Partner:   . Emotionally Abused:   Marland Kitchen Physically Abused:   . Sexually Abused:     Allergy: Allergies  Allergen Reactions  . Aspirin Anaphylaxis, Diarrhea and Nausea And Vomiting  . Banana Anaphylaxis, Itching, Shortness Of Breath and Swelling  . Other Itching, Shortness Of Breath and Swelling  . Peanut Oil Itching, Shortness Of Breath and Swelling  . Peanut-Containing Drug Products Shortness Of Breath, Itching and Swelling  . Pecan Nut (Diagnostic) Anaphylaxis    Current Outpatient Medications: Medications Prior to Admission  Medication Sig Dispense Refill Last Dose  . sertraline (ZOLOFT) 50 MG tablet Take 1 tablet (50 mg total) by mouth daily. 30 tablet 0     Hospital Medications: Current Facility-Administered Medications  Medication Dose Route Frequency Provider Last Rate Last Admin  . acetaminophen (TYLENOL) tablet 650 mg  650 mg Oral Q6H PRN Clapacs, Jackquline Denmark, MD   650 mg at 06/05/20 1400  . alum & mag hydroxide-simeth (MAALOX/MYLANTA) 200-200-20 MG/5ML suspension 30 mL  30 mL Oral Q4H PRN Clapacs, John T, MD      . magnesium hydroxide (MILK OF MAGNESIA) suspension 30 mL  30 mL Oral Daily PRN Clapacs, Jackquline Denmark, MD   30 mL at 06/05/20 0751  . prenatal multivitamin tablet 1 tablet  1 tablet Oral Q1200 Antonieta Pert, MD   1 tablet at 06/05/20 1247  . sertraline (ZOLOFT) tablet 50 mg  50 mg Oral Daily Clapacs, Jackquline Denmark, MD   50 mg at 06/05/20 0751    Physical Exam: Vitals:   06/03/20 2008 06/05/20 0613  BP: 115/80 (!) 109/59  Pulse: 75 68  Resp: 17 18  Temp: 98.4 F  (36.9 C) 98.4 F (36.9 C)  TempSrc: Oral Oral  SpO2: 100% 100%  Weight: 72.1 kg   Height: 4\' 11"  (1.499 m)     Temp:  [98.4 F (36.9 C)] 98.4 F (36.9 C) (06/12 08-01-1970) Pulse Rate:  [68] 68 (06/12 0613) Resp:  [18] 18 (06/12  5956) BP: (109)/(59) 109/59 (06/12 0613) SpO2:  [100 %] 100 % (06/12 0613) No intake/output data recorded. No intake/output data recorded. No intake or output data in the 24 hours ending 06/05/20 1548  Body mass index is 32.11 kg/m. Constitutional: Well nourished, well developed female in no acute distress.  HEENT: normal Neck:  Supple, normal appearance, and no thyromegaly  Cardiovascular:Regular rate and rhythm.  No murmurs, rubs or gallops. Respiratory:  Clear to auscultation bilateral. Normal respiratory effort Abdomen: positive bowel sounds and no masses, hernias; diffusely non tender to palpation, non distended Neuro: grossly intact Psych:  Normal mood and affect.  Skin:  Warm and dry.  MS: normal gait and normal bilateral lower extremity strength/ROM/symmetry Lymphatic:  No inguinal lymphadenopathy.    Recent Labs  Lab 06/02/20 2356  WBC 9.5  HGB 9.5*  HCT 28.4*  PLT 187   Recent Labs  Lab 06/02/20 2356  NA 137  K 3.2*  CL 107  CO2 21*  BUN 8  CREATININE 0.43*  CALCIUM 8.2*  GLUCOSE 90   Review of Ultrasound from 05/19/2020 reveals normal pregnancy findings, also fibroid  Assessment: Ms. Loveland is a 34 y.o. [id number] [redacted] weeks EGA for consultative care while admitted to [facility] for Depression and Substance Abuse. Problems List:  History of PTL/ PTD Prior CS x5 Recent history of gonorrhea History of Substance Abuse Depression Fibroid  Plan: 1. PNV 2. Healthy diet 3. Avoidance of drugs such as MJ, Cocaine, EtOH 4. Retest for gonorrhea and chlamydia, to ensure cure.  This can be done on a urine sample. 5. Monitor for s/sx PTL.  Low likelihood as she has had several term pregnancies.  May resume 17 OHP on discharge as  weekly preventative measure. 6. Fibroid likely low risk on pregnancy, carries risk of preterm labor. 7. Plan follow up w PCP provider ([facility]) and referral for delivery planning soon, as needs CS and is high risk due to having 5 prior CS. 8. Monitor for any further episodes of diarrhea.  May be medication related. 9. Zoloft appropriate for depression in pregnancy  A total of 60 minutes were spent face-to-face with the patient as well as preparation, review, communication, and documentation during this encounter.   Barnett Applebaum, MD, [facility], [facility] 06/05/2020  3:48 PM Pager [phone]

## 2020-06-05 NOTE — BHH Counselor (Signed)
Adult Comprehensive Assessment  Patient ID: [name], female   DOB: October 23, 1986, 34 y.o.   MRN: [id number]   Information Source: Information source: Patient  Current Stressors:  Patient states their primary concerns and needs for treatment are:: "My depression is worse, need help." Patient states their goals for this hospitilization and ongoing recovery are:: help with depression Family Relationships: Pt has one child in DSS custody/foster care, one child living with his father (also through DSS) and one child living with a sister (72 year old son) Physical health (include injuries & life threatening diseases): Pt reports she is pregnant Grief: Pt reports her mother died unexpectedly in the past two weeks.   Living/Environment/Situation:  Living Arrangements: lives alone Living conditions (as described by patient or guardian): Pt reports he home is dirty Who else lives in the home?: live alone since mother died How long has patient lived in current situation?: 4 years What is atmosphere in current home: Chaotic  Family History:  Marital status: Single Are you sexually active?: No What is your sexual orientation?: Heterosexual Does patient have children?: Yes How many children?: 3 How is patient's relationship with their children?: 79 year old, 48 year old: out of home placement, pt reports she does not have visitation currently.  35 year old stays with pt sister-not much contact.   Childhood History:  By whom was/is the patient raised?: Mother, Grandparents Additional childhood history information: Pt was born and raised in [place], [place].  Her parents were married, but separated when she was approximately 91-5 yo.  Description of patient's relationship with caregiver when they were a child: Pt was primarily raised by her mother and her mother's parents (she and her mother lived in their home).  She shared that she was very close to her grandparents.   Patient's description  of current relationship with people who raised him/her: mother recently died, limited contact with father. How were you disciplined when you got in trouble as a child/adolescent?: Pt shared that she received occasional spanking from her mother Does patient have siblings?: Yes Number of Siblings: 1 Description of patient's current relationship with siblings: Pt has one older sister.  She shared that she doesn't talk to her sister. Did patient suffer any verbal/emotional/physical/sexual abuse as a child?: No Did patient suffer from severe childhood neglect?: No Has patient ever been sexually abused/assaulted/raped as an adolescent or adult?: No Was the patient ever a victim of a crime or a disaster?: No Witnessed domestic violence?: No Has patient been effected by domestic violence as an adult?: No 06/05/20: no update to trauma history.   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  .  marijuana: pt reports she  has been smoking less marijuana, last use over a week ago.  Cocaine: pt did not report use but UDS positive for cocaine. If attempted suicide, did drugs/alcohol play  a role in this?: No Alcohol/Substance Abuse Treatment Hx: Denies past history Has alcohol/substance abuse ever caused legal problems?: Yes(possession of cocaine "years ago")  Social Support System:   Heritage manager System: None Describe Community Support System: "nobody" since her mother died Type of faith/religion: no How does patient's faith help to cope with current illness?: na  Leisure/Recreation:   Leisure and Hobbies: "nothing"  Strengths/Needs:   What is the patient's perception of their strengths?: cooking, being a mother Patient states they can use these personal strengths during their treatment to contribute to their recovery: when things are better with her kids, she is less depressed Patient states these barriers may affect/interfere with their treatment: none Patient states these barriers may affect their return to the community: transportation issues Other important information patient would like considered in planning for their treatment: no  Discharge Plan:   Currently receiving community mental health services: Yes: current pt with [facility] Patient states concerns and preferences for aftercare planning are: Pt wants to continue with [facility]. Patient states they will know when they are safe and ready for discharge when: "I dont' know" Does patient have access to transportation?: No Does patient have financial barriers related to discharge medications?: No Patient description of barriers related to discharge medications: Pt has medicaid Plan for no access to transportation at discharge: CSW will assess for plan. Will patient be returning to same living situation after discharge?: Yes   Summary/Recommendations:   Summary and Recommendations (to be completed by the evaluator): Pt is 34 year old female from [place].  Pt is diagnosed with major depressive disorder and was admitted under IVC due in increased depression and suicidal ideation.  Recommendations for  pt include crisis stabilization, therapeutic milieu, attend and participate in groups, medication management, and development of comprehensive mental wellness plan.  [name]. 06/05/2020

## 2020-06-05 NOTE — BHH Group Notes (Signed)
B[facility] Group Notes:  (Nursing/MHT/Case Management/Adjunct)  Date:  06/05/2020  Time:  9:15 PM  Type of Therapy:  Group Therapy  Participation Level:  Active  Participation Quality:  Appropriate  Affect:  Appropriate  Cognitive:  Alert  Insight:  Good  Engagement in Group:  Engaged and was cheerfully and said she had no goal until another patient said you did go to group.  Modes of Intervention:  Support  Summary of Progress/Problems:  [name] 06/05/2020, 9:15 PM

## 2020-06-05 NOTE — Progress Notes (Signed)
[facility] MD Progress Note  06/05/2020 11:55 AM [name]  MRN:  [id number] Subjective: Patient is a 34 year old female with a past psychiatric history significant for depression and substance abuse who presented to the [facility] emergency department on 06/03/2020 with suicidal ideation with thoughts of overdosing or cutting her self.  Objective: Patient is seen and examined.  Patient is a 34 year old female with the above-stated past psychiatric history seen in follow-up.  The patient was just admitted yesterday and remains depressed.  Her main request today is to be transferred to the [facility] in [place].  We discussed that, and I thought it would be unlikely that she would be accepted in transfer.  We did discuss this in treatment team with social work and they are aware.  She continues to be withdrawn.  In the emergency department she was intoxicated and her drug screen was positive for cocaine, amphetamines and cannabis.  Her blood alcohol in the emergency department was 89.  She is pregnant, and an ultrasound from 05/19/2020 showed a single live intrauterine gestation with no complicating features.  Apparently she is 23 weeks and 5 days by her last menstrual period at that time.  That would place her at approximately 28 weeks.  She denied suicidal ideation but did admit to being depressed, withdrawn, and feeling poorly.  Her blood pressure was 109/59.  She is afebrile.  Her heart rate was 68.  Pulse oximetry was 100% on room air.  She slept 8 hours.  Review of her admission laboratories revealed a mildly low potassium at 3.2.  Otherwise her electrolytes were normal.  She has a mild anemia with a hemoglobin of 9.5 and hematocrit of 28.4.  3 weeks ago her hemoglobin was 11.5 and her hematocrit was 33.4.  There has been a drop.  Additionally her platelets have dropped from 317,000-187,000.  It should be noted that a month ago her platelets were  182,000.  Her hemoglobin and hematocrit at that time were 9.7 and 28.9.  Her hemoglobin A1c is 9.5.  Urinalysis on admission showed 6-10 squamous epithelial cells, but 0-5 white blood cells.  Drug screen as per above.  Principal Problem: Severe recurrent major depression without psychotic features ([place]) Diagnosis: Principal Problem:   Severe recurrent major depression without psychotic features ([place]) Active Problems:   Stimulant use disorder ([facility]) (cocaine)   Alcohol use disorder, moderate, dependence ([facility])   Pregnant   Cocaine use disorder, moderate, dependence ([place])  Total Time spent with patient: 30 minutes  Past Psychiatric History: See admission H&P  Past Medical History:  Past Medical History:  Diagnosis Date  . Anxiety   . Depression   . History of substance abuse ([place])   . History of suicide attempt   . History of thyroid disease   . Hypertension   . Thyroid disease     Past Surgical History:  Procedure Laterality Date  . CESAREAN SECTION  2000, 2002, 2013   Family History:  Family History  Problem Relation Age of Onset  . Diabetes Mother    Family Psychiatric  History: See admission H&P Social History:  Social History   Substance and Sexual Activity  Alcohol Use Yes   Comment: social drinker     Social History   Substance and Sexual Activity  Drug Use Not Currently  . Types: Cocaine, Marijuana   Comment: Positive UDS 12/2017    Social History   Socioeconomic History  . Marital status: Single  Spouse name: Not on file  . Number of children: Not on file  . Years of education: Not on file  . Highest education level: Not on file  Occupational History  . Not on file  Tobacco Use  . Smoking status: Current Every Day Smoker    Packs/day: 0.50  . Smokeless tobacco: Never Used  Substance and Sexual Activity  . Alcohol use: Yes    Comment: social drinker  . Drug use: Not Currently    Types: Cocaine, Marijuana    Comment: Positive UDS 12/2017  .  Sexual activity: Not Currently    Birth control/protection: None  Other Topics Concern  . Not on file  Social History Narrative  . Not on file   Social Determinants of Health   Financial Resource Strain:   . Difficulty of Paying Living Expenses:   Food Insecurity:   . Worried About Programme researcher, broadcasting/film/video in the Last Year:   . Barista in the Last Year:   Transportation Needs:   . Freight forwarder (Medical):   Marland Kitchen Lack of Transportation (Non-Medical):   Physical Activity:   . Days of Exercise per Week:   . Minutes of Exercise per Session:   Stress:   . Feeling of Stress :   Social Connections:   . Frequency of Communication with Friends and Family:   . Frequency of Social Gatherings with Friends and Family:   . Attends Religious Services:   . Active Member of Clubs or Organizations:   . Attends Banker Meetings:   Marland Kitchen Marital Status:    Additional Social History:                         Sleep: Fair  Appetite:  Good  Current Medications: Current Facility-Administered Medications  Medication Dose Route Frequency Provider Last Rate Last Admin  . acetaminophen (TYLENOL) tablet 650 mg  650 mg Oral Q6H PRN Clapacs, Jackquline Denmark, MD   650 mg at 06/05/20 0751  . alum & mag hydroxide-simeth (MAALOX/MYLANTA) 200-200-20 MG/5ML suspension 30 mL  30 mL Oral Q4H PRN Clapacs, John T, MD      . magnesium hydroxide (MILK OF MAGNESIA) suspension 30 mL  30 mL Oral Daily PRN Clapacs, Jackquline Denmark, MD   30 mL at 06/05/20 0751  . sertraline (ZOLOFT) tablet 50 mg  50 mg Oral Daily Clapacs, Jackquline Denmark, MD   50 mg at 06/05/20 0751    Lab Results: No results found for this or any previous visit (from the past 48 hour(s)).  Blood Alcohol level:  Lab Results  Component Value Date   ETH 89 (H) 06/02/2020   ETH <10 05/11/2020    Metabolic Disorder Labs: Lab Results  Component Value Date   HGBA1C 5.1 01/02/2018   MPG 99.67 01/02/2018   No results found for: PROLACTIN Lab  Results  Component Value Date   CHOL 218 (H) 01/02/2018   TRIG 187 (H) 01/02/2018   HDL 81 01/02/2018   CHOLHDL 2.7 01/02/2018   VLDL 37 01/02/2018   LDLCALC 100 (H) 01/02/2018   LDLCALC 82 01/12/2016    Physical Findings: AIMS:  , ,  ,  ,    CIWA:    COWS:     Musculoskeletal: Strength & Muscle Tone: within normal limits Gait & Station: normal Patient leans: N/A  Psychiatric Specialty Exam: Physical Exam  Nursing note and vitals reviewed. Constitutional: She is oriented to person, place, and  time.  HENT:  Head: Normocephalic and atraumatic.  Respiratory: Effort normal.  GI: Normal appearance.  Neurological: She is alert and oriented to person, place, and time.    Review of Systems  Blood pressure (!) 109/59, pulse 68, temperature 98.4 F (36.9 C), temperature source Oral, resp. rate 18, height 4\' 11"  (1.499 m), weight 72.1 kg, last menstrual period 12/04/2019, SpO2 100 %.Body mass index is 32.11 kg/m.  General Appearance: Disheveled  Eye Contact:  Fair  Speech:  Normal Rate  Volume:  Decreased  Mood:  Anxious and Depressed  Affect:  Congruent  Thought Process:  Coherent and Descriptions of Associations: Circumstantial  Orientation:  Full (Time, Place, and Person)  Thought Content:  Logical  Suicidal Thoughts:  No  Homicidal Thoughts:  No  Memory:  Immediate;   Fair Recent;   Fair Remote;   Fair  Judgement:  Intact  Insight:  Fair  Psychomotor Activity:  Decreased  Concentration:  Concentration: Fair and Attention Span: Fair  Recall:  14/09/2019 of Knowledge:  Fair  Language:  Good  Akathisia:  Negative  Handed:  Right  AIMS (if indicated):     Assets:  Desire for Improvement Resilience  ADL's:  Intact  Cognition:  WNL  Sleep:  Number of Hours: 8     Treatment Plan Summary: Daily contact with patient to assess and evaluate symptoms and progress in treatment, Medication management and Plan : Patient is seen and examined.  Patient is a 34 year old  female with the above-stated past psychiatric history who is seen in follow-up.   Diagnosis: 1.  Major depression 2.  Alcohol use disorder. 3.  Cocaine use disorder. 4.  Cannabis use disorder 5.  Amphetamine use disorder 6.  Intrauterine pregnancy at approximately 28 weeks 7.  Anemia 8.  Hypokalemia 9.  Type 2 diabetes versus pregnancy-induced diabetes  Pertinent findings on examination today: 1.  Continue depression 2.  No evidence of acute alcohol withdrawal at this point.  Plan: 1.  Order hemoglobin A1c to confirm diagnosis. 2.  Supplement hypokalemia with 20 mEq of KCl x1. 3.  Restart prenatal vitamins 4.  Continue Zoloft 50 mg p.o. daily for anxiety and depression 5.  Contact OB/GYN for consultation and recommendations on treatment with regard to pregnancy and possible withdrawal syndromes 6.  Change diet to low carbohydrate for now 7.  Disposition planning-in progress.  20, MD 06/05/2020, 11:55 AM

## 2020-06-05 NOTE — Plan of Care (Signed)
D: Pt alert and oriented x 4. Pt rates depression 8/10, and anxiety 8/10. Pt reports experiencing hip and lower back pain rated 8/10, PRN meds given. Pt has c/o constipation, milk of mg given and found ineffective. Pt denies experiencing any SI/HI, or AVH at this time.   A: Scheduled medications administered to pt, per MD orders. Support and encouragement provided. Frequent verbal contact made. Routine safety checks conducted q15 minutes.   R: No adverse drug reactions noted. Pt verbally contracts for safety at this time. Pt complaint with medications and treatment plan. Pt interacts well with others on the unit. Pt remains safe at this time. Will continue to monitor.   Problem: Education: Goal: Verbalization of understanding the information provided will improve Outcome: Progressing   Problem: Education: Goal: Emotional status will improve Outcome: Not Progressing Goal: Mental status will improve Outcome: Not Progressing

## 2020-06-05 NOTE — BHH Group Notes (Signed)
LCSW Relapse Prevention and Social Support Group Note   06/05/2020 1300  Type of Group and Topic: Psychoeducational Group:  Relapse prevention and social support.   Participation Level:  Active  Description of Group  Relapse prevention and developing social support group identifies mental health triggers and early warning signs as a first step towards developing appropriate coping skills.  This can include a relapse of mental health or substance use symptoms.  With the help of examples, patients are encouraged to recognize and intervene when symptoms return before a crisis occurs.  Patients are also engaged to evaluate their current support network and to consider ways to increase and broaden that network.    Therapeutic Goals 1. Patients will identify triggers and early symptoms related to both mental health and substance use relapses. 2. Patients will begin the process of identifying plans/coping skills to manage these symptoms before they escalate to a crisis. 3. Patients will consider individuals in their current support network, whether they are positive or negative supports, and look at options to increase the number of positive supports in their network.    Summary of Patient Progress: Pt came to group late and immediately made a negative comment toward another group member.  CSW redirected her and after she did this again, CSW more directly asked her to speak to her own thoughts but to not direct comments towards other group member's contributions and statements.  Pt was more appropriate after this but mostly made short, unrelated remarks throughout the time she was in the group.  Immature type comments.      Therapeutic Modalities: Cognitive Behavioral Therapy Psychoeducation    Daleen Squibb, MSW, LCSW

## 2020-06-06 LAB — CHLAMYDIA/NGC RT PCR (ARMC ONLY)
Chlamydia Tr: NOT DETECTED
N gonorrhoeae: NOT DETECTED

## 2020-06-06 LAB — TSH: TSH: 0.926 u[IU]/mL (ref 0.350–4.500)

## 2020-06-06 MED ORDER — POLYETHYLENE GLYCOL 3350 17 G PO PACK
17.0000 g | PACK | Freq: Every day | ORAL | Status: DC
  Administered 2020-06-06: 17 g via ORAL
  Filled 2020-06-06 (×3): qty 1

## 2020-06-06 MED ORDER — DIPHENHYDRAMINE HCL 25 MG PO CAPS
ORAL_CAPSULE | ORAL | Status: AC
  Administered 2020-06-06: 25 mg via ORAL
  Filled 2020-06-06: qty 1

## 2020-06-06 NOTE — Plan of Care (Signed)
°  Problem: Education: Goal: Knowledge of [facility] Education information/materials will improve Outcome: Progressing Goal: Emotional status will improve Outcome: Progressing Goal: Mental status will improve Outcome: Progressing Goal: Verbalization of understanding the information provided will improve Outcome: Progressing   Problem: Safety: Goal: Periods of time without injury will increase Outcome: Progressing   Problem: Education: Goal: Knowledge of the prescribed therapeutic regimen will improve Outcome: Progressing   Problem: Safety: Goal: Ability to disclose and discuss suicidal ideas will improve Outcome: Progressing Goal: Ability to identify and utilize support systems that promote safety will improve Outcome: Progressing

## 2020-06-06 NOTE — Progress Notes (Signed)
Patient is pleasant and easy to engage. She has concerns tonight regarding not having any sleep medications, but was willing to try to sleep without any meds.  She did receive Tylenol for generalized body ache which she rated at 6 on 0-10 scale.   She denied having SI/HI/AVH and anxiety. She does endorse having some depression, which she reports is manageable.  She remains safe on the unity with 15 minute safety checks. Informed to contact staff with any concerns.

## 2020-06-06 NOTE — Progress Notes (Signed)
Patient did not eat lunch because she was not hungry, however, she just came up to this Clinical research associate and stated that she was hungry now. This Clinical research associate provided patient with some snacks to hold her over until dinner time. Patient was satisfied and went back to her room.

## 2020-06-06 NOTE — Plan of Care (Signed)
  Problem: Education: Goal: Emotional status will improve Outcome: Progressing Goal: Mental status will improve Outcome: Progressing Goal: Verbalization of understanding the information provided will improve Outcome: Progressing   Problem: Safety: Goal: Periods of time without injury will increase Outcome: Progressing   Problem: Safety: Goal: Ability to disclose and discuss suicidal ideas will improve Outcome: Not Progressing Goal: Ability to identify and utilize support systems that promote safety will improve Outcome: Not Progressing   Problem: Education: Goal: Utilization of techniques to improve thought processes will improve Outcome: Not Applicable

## 2020-06-06 NOTE — BHH Group Notes (Signed)
LCSW Group Therapy Note  06/06/2020   1:00 - 1:35 PM   Type of Therapy and Topic:  Group Therapy: Anger Cues and Responses  Participation Level:  Active   Description of Group:   In this group, patients learned how to recognize the physical, cognitive, emotional, and behavioral responses they have to anger-provoking situations.  They identified a recent time they became angry and how they reacted.  They analyzed how their reaction was possibly beneficial and how it was possibly unhelpful.  The group discussed a variety of healthier coping skills that could help with such a situation in the future.  Focus was placed on how helpful it is to recognize the underlying emotions to our anger, because working on those can lead to a more permanent solution as well as our ability to focus on the important rather than the urgent.  Therapeutic Goals: 1. Patients will remember their last incident of anger and how they felt emotionally and physically, what their thoughts were at the time, and how they behaved. 2. Patients will identify how their behavior at that time worked for them, as well as how it worked against them. 3. Patients will explore possible new behaviors to use in future anger situations. 4. Patients will learn that anger itself is normal and cannot be eliminated, and that healthier reactions can assist with resolving conflict rather than worsening situations.  Summary of Patient Progress:  The patient shared that her most recent time of anger was when she was here and the social worker came and took her children out of her home. Pt was 1-2 times on topic with comments and other times talking to peers during group and made comments that her back hurts and can I go now.   Therapeutic Modalities:   Cognitive Behavioral Therapy  Shellia Cleverly

## 2020-06-06 NOTE — Progress Notes (Signed)
Patient is pleasant and easy to engage. She denies SI/HI/AVH and anxiety at this encounter. She continues to endorse depression stating it is situational. She received her 25mg  dose of Benadryl that she requested for sleep. She inquired about an order for double portions of food. She was receptive to education regarding the doctor being the one to place that order. She remains safe on the unit with 15 minute safety checks. Informed to contact staff with any concerns.

## 2020-06-06 NOTE — Progress Notes (Signed)
Patient came out of her room agitated and yelling for another patient to stop sliding his shoes. This writer reassured patient that the other member on the unit would not be walking down her hallway. Patient was able to calm down and remains safe on the unit at this time.

## 2020-06-06 NOTE — Plan of Care (Signed)
D- Patient alert and oriented. Patient presents in a pleasant mood on assessment stating that she didn't sleep good last night because she "tossed and turned" all night. Patient had complaints of lower back pain, rating her pain a "10/10", in which she did request pain medication for relief. Patient denies anxiety, however, she does endorse depression, reporting "I sit and think about stuff and that makes it worse". Patient also denies SI, HI, AVH, at this time. Patient had no stated goals for today.  A- Scheduled medications administered to patient, per MD orders. Support and encouragement provided.  Routine safety checks conducted every 15 minutes.  Patient informed to notify staff with problems or concerns.  R- No adverse drug reactions noted. Patient contracts for safety at this time. Patient compliant with medications and treatment plan. Patient receptive, calm, and cooperative. Patient interacts well with others on the unit.  Patient remains safe at this time.  Problem: Education: Goal: Knowledge of [facility] Education information/materials will improve Outcome: Progressing Goal: Emotional status will improve Outcome: Progressing Goal: Mental status will improve Outcome: Progressing Goal: Verbalization of understanding the information provided will improve Outcome: Progressing   Problem: Safety: Goal: Periods of time without injury will increase Outcome: Progressing   Problem: Education: Goal: Knowledge of the prescribed therapeutic regimen will improve Outcome: Progressing   Problem: Safety: Goal: Ability to disclose and discuss suicidal ideas will improve Outcome: Progressing Goal: Ability to identify and utilize support systems that promote safety will improve Outcome: Progressing

## 2020-06-06 NOTE — Progress Notes (Signed)
[facility] MD Progress Note  06/06/2020 10:15 AM [name]  MRN:  [id number] Subjective:  Patient is a 34 year old female with a past psychiatric history significant for depression and substance abuse who presented to the [facility] emergency department on 06/03/2020 with suicidal ideation with thoughts of overdosing or cutting her self.  Objective: Patient is seen and examined.  Patient is a 34 year old female with the above-stated past psychiatric history is seen in follow-up.  She is essentially unchanged from yesterday.  Her only complaint today is constipation.  Nursing staff and I as well as the patient discussed the possibility of using MiraLAX.  An OB/GYN consultation was called for yesterday.  They saw the patient.  The only additional recommendation outside of follow-up after discharge was to repeat her chlamydia/GC urine test.  That has already been ordered.  Her hemoglobin A1c came back normal, so I have changed her diet back to regular.  She remains isolated and depressed.  She did deny suicidal ideation today.  Her vital signs are stable, she is afebrile.  She slept 9.25 hours last night.  Her iron studies came back essentially normal.  Her B12 was 282.  Her hemoglobin A1c was 5.5.  Her RBC was slightly decreased at 3.24, her reticulocyte percentage was 3.2, reticulocyte count absolute was 102.7.  Her immature reticulocyte fractionation was 18.3%.  Review of her records also revealed a history of "thyroid disease".  TSH will be added today.  Principal Problem: Severe recurrent major depression without psychotic features (HCC) Diagnosis: Principal Problem:   Severe recurrent major depression without psychotic features (HCC) Active Problems:   Stimulant use disorder (HCC) (cocaine)   Alcohol use disorder, moderate, dependence (HCC)   Pregnant   Cocaine use disorder, moderate, dependence (HCC)   History of preterm labor, current pregnancy, second trimester  Total  Time spent with patient: 15 minutes  Past Psychiatric History: See admission H&P  Past Medical History:  Past Medical History:  Diagnosis Date  . Anxiety   . Depression   . History of substance abuse (HCC)   . History of suicide attempt   . History of thyroid disease   . Hypertension   . Thyroid disease     Past Surgical History:  Procedure Laterality Date  . CESAREAN SECTION  2000, 2002, 2013   Family History:  Family History  Problem Relation Age of Onset  . Diabetes Mother    Family Psychiatric  History: See admission H&P Social History:  Social History   Substance and Sexual Activity  Alcohol Use Yes   Comment: social drinker     Social History   Substance and Sexual Activity  Drug Use Not Currently  . Types: Cocaine, Marijuana   Comment: Positive UDS 12/2017    Social History   Socioeconomic History  . Marital status: Single    Spouse name: Not on file  . Number of children: Not on file  . Years of education: Not on file  . Highest education level: Not on file  Occupational History  . Not on file  Tobacco Use  . Smoking status: Current Every Day Smoker    Packs/day: 0.50  . Smokeless tobacco: Never Used  Substance and Sexual Activity  . Alcohol use: Yes    Comment: social drinker  . Drug use: Not Currently    Types: Cocaine, Marijuana    Comment: Positive UDS 12/2017  . Sexual activity: Not Currently    Birth control/protection: None  Other Topics Concern  .  Not on file  Social History Narrative  . Not on file   Social Determinants of Health   Financial Resource Strain:   . Difficulty of Paying Living Expenses:   Food Insecurity:   . Worried About Programme researcher, broadcasting/film/video in the Last Year:   . Barista in the Last Year:   Transportation Needs:   . Freight forwarder (Medical):   Marland Kitchen Lack of Transportation (Non-Medical):   Physical Activity:   . Days of Exercise per Week:   . Minutes of Exercise per Session:   Stress:   . Feeling  of Stress :   Social Connections:   . Frequency of Communication with Friends and Family:   . Frequency of Social Gatherings with Friends and Family:   . Attends Religious Services:   . Active Member of Clubs or Organizations:   . Attends Banker Meetings:   Marland Kitchen Marital Status:    Additional Social History:                         Sleep: Good  Appetite:  Good  Current Medications: Current Facility-Administered Medications  Medication Dose Route Frequency Provider Last Rate Last Admin  . acetaminophen (TYLENOL) tablet 650 mg  650 mg Oral Q6H PRN Clapacs, Jackquline Denmark, MD   650 mg at 06/06/20 0813  . alum & mag hydroxide-simeth (MAALOX/MYLANTA) 200-200-20 MG/5ML suspension 30 mL  30 mL Oral Q4H PRN Clapacs, John T, MD      . magnesium hydroxide (MILK OF MAGNESIA) suspension 30 mL  30 mL Oral Daily PRN Clapacs, Jackquline Denmark, MD   30 mL at 06/05/20 0751  . prenatal multivitamin tablet 1 tablet  1 tablet Oral Q1200 Antonieta Pert, MD   1 tablet at 06/05/20 1247  . sertraline (ZOLOFT) tablet 50 mg  50 mg Oral Daily Clapacs, Jackquline Denmark, MD   50 mg at 06/06/20 0813    Lab Results:  Results for orders placed or performed during the hospital encounter of 06/03/20 (from the past 48 hour(s))  Vitamin B12     Status: None   Collection Time: 06/05/20  1:01 PM  Result Value Ref Range   Vitamin B-12 282 180 - 914 pg/mL    Comment: (NOTE) This assay is not validated for testing neonatal or myeloproliferative syndrome specimens for Vitamin B12 levels. Performed at [facility], [address], [place], [place] [address]   Folate     Status: None   Collection Time: 06/05/20  1:01 PM  Result Value Ref Range   Folate 12.3 >5.9 ng/mL    Comment: Performed at [facility], [address] [place], [place] [address]  Iron and TIBC     Status: None   Collection Time: 06/05/20  1:01 PM  Result Value Ref Range   Iron 65 28 - 170 ug/dL   TIBC 825 053 - 976 ug/dL    Saturation Ratios 16 10.4 - 31.8 %   UIBC 349 ug/dL    Comment: Performed at [facility], [address] [place], [place] [address]  Ferritin     Status: None   Collection Time: 06/05/20  1:01 PM  Result Value Ref Range   Ferritin 27 11 - 307 ng/mL    Comment: Performed at [facility], [address] [place], [place] [address]  Reticulocytes     Status: Abnormal   Collection Time: 06/05/20  1:01 PM  Result Value Ref Range   Retic Ct Pct 3.2 (H) 0.4 - 3.1 %   RBC. 3.24 (L) 3.87 - 5.11 MIL/uL   Retic Count, Absolute 102.7 19.0 - 186.0 K/uL   Immature Retic Fract 18.3 (H) 2.3 - 15.9 %    Comment: Performed at [facility], [address] [place], [place] [address]  Hemoglobin A1c     Status: None   Collection Time: 06/05/20  7:12 PM  Result Value Ref Range   Hgb A1c MFr Bld 5.5 4.8 - 5.6 %    Comment: (NOTE) Pre diabetes:          5.7%-6.4%  Diabetes:              >6.4%  Glycemic control for   <7.0% adults with diabetes    Mean Plasma Glucose 111.15 mg/dL    Comment: Performed at [facility], [address], [place], [place] [address]    Blood Alcohol level:  Lab Results  Component Value Date   ETH 89 (H) 06/02/2020   ETH <10 05/11/2020    Metabolic Disorder Labs: Lab Results  Component Value Date   HGBA1C 5.5 06/05/2020   MPG 111.15 06/05/2020   MPG 99.67 01/02/2018   No results found for: PROLACTIN Lab Results  Component Value Date   CHOL 218 (H) 01/02/2018   TRIG 187 (H) 01/02/2018   HDL 81 01/02/2018   CHOLHDL 2.7 01/02/2018   VLDL 37 01/02/2018   LDLCALC 100 (H) 01/02/2018   LDLCALC 82 01/12/2016    Physical Findings: AIMS:  , ,  ,  ,    CIWA:    COWS:     Musculoskeletal: Strength & Muscle Tone: within normal limits Gait & Station: normal Patient leans: N/A  Psychiatric Specialty Exam: Physical Exam  Nursing note and vitals reviewed. HENT:  Head: Normocephalic and atraumatic.  Respiratory: Effort  normal.  Neurological: She is alert.    Review of Systems  Blood pressure 118/66, pulse 66, temperature 98.5 F (36.9 C), temperature source Oral, resp. rate 18, height 4\' 11"  (1.499 m), weight 72.1 kg, last menstrual period 12/04/2019, SpO2 99 %.Body mass index is 32.11 kg/m.  General Appearance: Disheveled  Eye Contact:  Minimal  Speech:  Normal Rate  Volume:  Decreased  Mood:  Anxious, Depressed and Dysphoric  Affect:  Blunt  Thought Process:  Coherent and Descriptions of Associations: Circumstantial  Orientation:  Full (Time, Place, and Person)  Thought Content:  Logical  Suicidal Thoughts:  No  Homicidal Thoughts:  No  Memory:  Immediate;   Poor Recent;   Poor Remote;   Poor  Judgement:  Impaired  Insight:  Lacking  Psychomotor Activity:  Psychomotor Retardation  Concentration:  Concentration: Fair and Attention Span: Fair  Recall:  14/09/2019 of Knowledge:  Fair  Language:  Fair  Akathisia:  Negative  Handed:  Right  AIMS (if indicated):     Assets:  Desire for Improvement Resilience  ADL's:  Impaired  Cognition:  WNL  Sleep:  Number of Hours: 9.25     Treatment Plan Summary: Daily contact with patient to assess and evaluate symptoms and progress in treatment, Medication management and Plan : Patient is seen and examined.  Patient is a 34 year old female with the above-stated past psychiatric history who is seen in follow-up.   Diagnosis: 1.  Major depression 2.  Alcohol use disorder. 3.  Cocaine use disorder. 4.  Cannabis use disorder 5.  Amphetamine use disorder  6.  Intrauterine pregnancy at approximately 28 weeks 7.  Anemia  Pertinent findings on examination today: 1.  Continue depression, isolation and withdrawn affect. 2.  No evidence of alcohol withdrawal syndromes at this point. 3.  Appreciate OB/GYN consultation. 4.  Add MiraLAX 17 g in 8 ounces water p.o. daily for constipation. 5.  Hemoglobin A1c at 5.5. 6.  Old records revealed history of  "thyroid disease".  Plan: Continue prenatal vitamin 1 tablet p.o. daily for nutritional supplementation. 2.  Continue sertraline 50 mg p.o. daily for depression and anxiety. 3.  Order chlamydia and gonococcal urine PCR. 4.  Normal hemoglobin A1c at this point. 5.  Order TSH given reported history of thyroid disease. 6.  Order MiraLAX 17 g in 8 ounces of water p.o. daily for constipation. 7.  Disposition planning-in progress. Sharma Covert, MD 06/06/2020, 10:15 AM

## 2020-06-07 MED ORDER — TRAZODONE HCL 100 MG PO TABS
100.0000 mg | ORAL_TABLET | Freq: Every day | ORAL | Status: DC
  Administered 2020-06-07 – 2020-06-12 (×6): 100 mg via ORAL
  Filled 2020-06-07 (×6): qty 1

## 2020-06-07 MED ORDER — SENNOSIDES-DOCUSATE SODIUM 8.6-50 MG PO TABS
2.0000 | ORAL_TABLET | ORAL | Status: AC
  Administered 2020-06-08: 2 via ORAL
  Filled 2020-06-07: qty 2

## 2020-06-07 MED ORDER — DOCUSATE SODIUM 100 MG PO CAPS
200.0000 mg | ORAL_CAPSULE | Freq: Two times a day (BID) | ORAL | Status: DC
  Administered 2020-06-07 – 2020-06-14 (×7): 200 mg via ORAL
  Filled 2020-06-07 (×11): qty 2

## 2020-06-07 NOTE — [facility] Counselor (Addendum)
CSW called the following to get additional information on services available to the pt:  [facility] They have a women's sober living home, however pt needs 2 months of sobriety before continuing.  [facility] Only provides outpatient services at this time.  [facility] CSW left HIPAA compliant voicemail.  [facility] Pt needs to call and complete a phone interview herself.  Need to have Medicaid to be accepted, can accept pt without insurance as well.  Can't be actively using at admission, need to have been through detox.  Methadone patients are not accepted, subaxone is okay.  Patient declined referral to [facility] and accepted [facility] and [facility] referrals.   CSW called [facility] coordinator Marylene Land, who report that pt can not be further than 5 months along as "they are high risk at that point".  Notes indicate that pt is [redacted] weeks pregnant at this time.  [name], MSW, LCSW 06/07/2020 3:32 PM

## 2020-06-07 NOTE — Progress Notes (Signed)
Recreation Therapy Notes   Date: 06/07/2020  Time: 9:30 am   Location: Outside   Behavioral response: N/A   Intervention Topic: Relaxation   Discussion/Intervention: Patient did not attend group.   Clinical Observations/Feedback:  Patient did not attend group.   Leanore Biggers LRT/CTRS      Asencion Loveday 06/07/2020 11:30 AM

## 2020-06-07 NOTE — Progress Notes (Signed)
Patient is pleasant and easy to engage. Active on the unit, engaging well with other peers. Patient received medication and tolerated without incident. Patient remains safe on the unit with 15 minute safety checks. Patient informed to contact staff with any concerns.

## 2020-06-07 NOTE — Plan of Care (Signed)
  Problem: Education: °Goal: Knowledge of [facility] Education information/materials will improve °Outcome: Progressing °Goal: Emotional status will improve °Outcome: Progressing °Goal: Mental status will improve °Outcome: Progressing °Goal: Verbalization of understanding the information provided will improve °Outcome: Progressing °  °Problem: Safety: °Goal: Periods of time without injury will increase °Outcome: Progressing °  °Problem: Education: °Goal: Knowledge of the prescribed therapeutic regimen will improve °Outcome: Progressing °  °Problem: Safety: °Goal: Ability to disclose and discuss suicidal ideas will improve °Outcome: Progressing °Goal: Ability to identify and utilize support systems that promote safety will improve °Outcome: Progressing °  °

## 2020-06-07 NOTE — Progress Notes (Signed)
[facility] MD Progress Note  06/07/2020 2:53 PM Sherica Paternostro Kollmann  MRN:  [id number] Subjective: Follow-up this patient with depression and substance abuse in pregnancy.  Patient has been complaining today of pain in her left upper quadrant.  Her abdomen is quite distended.  She looks even more pregnant than she is.  The area she is indicating she is able to rub on it and palpate it without getting severely worse and she is able to walk around.  Mood wise, she is not feeling much better.  No active suicidal thought but feels sad down and hopeless and anxious.  No psychotic symptoms.  Has been compliant and appropriate on the unit. Principal Problem: Severe recurrent major depression without psychotic features (HCC) Diagnosis: Principal Problem:   Severe recurrent major depression without psychotic features (HCC) Active Problems:   Stimulant use disorder (HCC) (cocaine)   Alcohol use disorder, moderate, dependence (HCC)   Pregnant   Cocaine use disorder, moderate, dependence (HCC)   History of preterm labor, current pregnancy, second trimester  Total Time spent with patient: 30 minutes  Past Psychiatric History: Past history of recurrent depression and substance abuse  Past Medical History:  Past Medical History:  Diagnosis Date  . Anxiety   . Depression   . History of substance abuse (HCC)   . History of suicide attempt   . History of thyroid disease   . Hypertension   . Thyroid disease     Past Surgical History:  Procedure Laterality Date  . CESAREAN SECTION  2000, 2002, 2013   Family History:  Family History  Problem Relation Age of Onset  . Diabetes Mother    Family Psychiatric  History: See previous Social History:  Social History   Substance and Sexual Activity  Alcohol Use Yes   Comment: social drinker     Social History   Substance and Sexual Activity  Drug Use Not Currently  . Types: Cocaine, Marijuana   Comment: Positive UDS 12/2017    Social History    Socioeconomic History  . Marital status: Single    Spouse name: Not on file  . Number of children: Not on file  . Years of education: Not on file  . Highest education level: Not on file  Occupational History  . Not on file  Tobacco Use  . Smoking status: Current Every Day Smoker    Packs/day: 0.50  . Smokeless tobacco: Never Used  Substance and Sexual Activity  . Alcohol use: Yes    Comment: social drinker  . Drug use: Not Currently    Types: Cocaine, Marijuana    Comment: Positive UDS 12/2017  . Sexual activity: Not Currently    Birth control/protection: None  Other Topics Concern  . Not on file  Social History Narrative  . Not on file   Social Determinants of Health   Financial Resource Strain:   . Difficulty of Paying Living Expenses:   Food Insecurity:   . Worried About Programme researcher, broadcasting/film/video in the Last Year:   . Barista in the Last Year:   Transportation Needs:   . Freight forwarder (Medical):   Marland Kitchen Lack of Transportation (Non-Medical):   Physical Activity:   . Days of Exercise per Week:   . Minutes of Exercise per Session:   Stress:   . Feeling of Stress :   Social Connections:   . Frequency of Communication with Friends and Family:   . Frequency of Social Gatherings with Friends and  Family:   . Attends Religious Services:   . Active Member of Clubs or Organizations:   . Attends Banker Meetings:   Marland Kitchen Marital Status:    Additional Social History:                         Sleep: Fair  Appetite:  Fair  Current Medications: Current Facility-Administered Medications  Medication Dose Route Frequency Provider Last Rate Last Admin  . acetaminophen (TYLENOL) tablet 650 mg  650 mg Oral Q6H PRN Nakota Ackert, Jackquline Denmark, MD   650 mg at 06/07/20 0754  . alum & mag hydroxide-simeth (MAALOX/MYLANTA) 200-200-20 MG/5ML suspension 30 mL  30 mL Oral Q4H PRN Paizlee Kinder T, MD      . docusate sodium (COLACE) capsule 200 mg  200 mg Oral BID  Ulanda Tackett T, MD      . magnesium hydroxide (MILK OF MAGNESIA) suspension 30 mL  30 mL Oral Daily PRN Ceili Boshers, Jackquline Denmark, MD   30 mL at 06/05/20 0751  . polyethylene glycol (MIRALAX / GLYCOLAX) packet 17 g  17 g Oral Daily Antonieta Pert, MD   17 g at 06/06/20 1146  . prenatal multivitamin tablet 1 tablet  1 tablet Oral Q1200 Antonieta Pert, MD   1 tablet at 06/07/20 1217  . senna-docusate (Senokot-S) tablet 2 tablet  2 tablet Oral NOW Conchetta Lamia T, MD      . sertraline (ZOLOFT) tablet 50 mg  50 mg Oral Daily Makila Colombe, Jackquline Denmark, MD   50 mg at 06/07/20 0755  . traZODone (DESYREL) tablet 100 mg  100 mg Oral QHS Josten Warmuth, Jackquline Denmark, MD        Lab Results:  Results for orders placed or performed during the hospital encounter of 06/03/20 (from the past 48 hour(s))  Hemoglobin A1c     Status: None   Collection Time: 06/05/20  7:12 PM  Result Value Ref Range   Hgb A1c MFr Bld 5.5 4.8 - 5.6 %    Comment: (NOTE) Pre diabetes:          5.7%-6.4%  Diabetes:              >6.4%  Glycemic control for   <7.0% adults with diabetes    Mean Plasma Glucose 111.15 mg/dL    Comment: Performed at [facility], [address], [place], [place] [address]  Chlamydia/NGC rt PCR [facility] only)     Status: None   Collection Time: 06/06/20 11:17 AM   Specimen: Urine  Result Value Ref Range   Specimen source GC/Chlam URINE, RANDOM    Chlamydia Tr NOT DETECTED NOT DETECTED   N gonorrhoeae NOT DETECTED NOT DETECTED    Comment: (NOTE) This CT/NG assay has not been evaluated in patients with a history of  hysterectomy. Performed at [facility], [address] [place], [place] [address]     Blood Alcohol level:  Lab Results  Component Value Date   ETH 89 (H) 06/02/2020   ETH <10 05/11/2020    Metabolic Disorder Labs: Lab Results  Component Value Date   HGBA1C 5.5 06/05/2020   MPG 111.15 06/05/2020   MPG 99.67 01/02/2018   No results found for: PROLACTIN Lab Results  Component  Value Date   CHOL 218 (H) 01/02/2018   TRIG 187 (H) 01/02/2018   HDL 81 01/02/2018   CHOLHDL 2.7 01/02/2018   VLDL 37 01/02/2018   LDLCALC 100 (H) 01/02/2018  [place] 82 01/12/2016    Physical Findings: AIMS:  , ,  ,  ,    CIWA:    COWS:     Musculoskeletal: Strength & Muscle Tone: within normal limits Gait & Station: normal Patient leans: N/A  Psychiatric Specialty Exam: Physical Exam  Nursing note and vitals reviewed. Constitutional: She appears well-developed.  HENT:  Head: Normocephalic and atraumatic.  Eyes: Pupils are equal, round, and reactive to light. Conjunctivae are normal.  Cardiovascular: Normal heart sounds.  Respiratory: Effort normal.  GI: She exhibits distension.  Patient is probably around 6 months pregnant and she looks it.  She is also complaining of some pain in her left upper quadrant but is able to rub on it without extreme worsening  Musculoskeletal:        General: Normal range of motion.     Cervical back: Normal range of motion.  Neurological: She is alert.  Skin: Skin is warm and dry.  Psychiatric: Her speech is normal. Judgment normal. Her mood appears anxious. She is slowed and withdrawn. She exhibits a depressed mood. She expresses no homicidal and no suicidal ideation.    Review of Systems  Constitutional: Negative.   HENT: Negative.   Eyes: Negative.   Respiratory: Negative.   Cardiovascular: Negative.   Gastrointestinal: Negative.   Musculoskeletal: Negative.   Skin: Negative.   Neurological: Negative.   Psychiatric/Behavioral: Positive for dysphoric mood and sleep disturbance.    Blood pressure 129/74, pulse 68, temperature 97.9 F (36.6 C), temperature source Oral, resp. rate 18, height 4\' 11"  (1.499 m), weight 72.1 kg, last menstrual period 12/04/2019, SpO2 100 %.Body mass index is 32.11 kg/m.  General Appearance: Casual  Eye Contact:  Good  Speech:  Clear and Coherent  Volume:  Normal  Mood:  Dysphoric  Affect:   Constricted  Thought Process:  Coherent  Orientation:  Full (Time, Place, and Person)  Thought Content:  Logical  Suicidal Thoughts:  No  Homicidal Thoughts:  No  Memory:  Immediate;   Fair Recent;   Fair Remote;   Fair  Judgement:  Fair  Insight:  Fair  Psychomotor Activity:  Normal  Concentration:  Concentration: Fair  Recall:  [employer] of Knowledge:  Fair  Language:  Fair  Akathisia:  No  Handed:  Right  AIMS (if indicated):     Assets:  Desire for Improvement  ADL's:  Intact  Cognition:  WNL  Sleep:  Number of Hours: 9.25     Treatment Plan Summary: Daily contact with patient to assess and evaluate symptoms and progress in treatment, Medication management and Plan Adding Senokot and Colace today.  Encourage patient to stay well-hydrated.  Increase diet at her request.  No change to antidepressant.  Alethia Berthold, MD 06/07/2020, 2:53 PM

## 2020-06-07 NOTE — Plan of Care (Signed)
D- Patient alert and oriented. Patient presents in a pleasant mood on assessment stating that she slept better last night being that she finally received her Benadryl. Patient continues to endorse hip pain, rating it a "10/10", in which she did request medication from this Clinical research associate. Patient denies any signs/symptoms of depression/anxiety, reporting that she is feeling "alright" overall. Patient also denies SI, HI, AVH at this time. Patient had no stated goals for today.  A- Scheduled medications administered to patient, per MD orders. Support and encouragement provided.  Routine safety checks conducted every 15 minutes.  Patient informed to notify staff with problems or concerns.  R- No adverse drug reactions noted. Patient contracts for safety at this time. Patient compliant with medications and treatment plan. Patient receptive, calm, and cooperative. Patient interacts well with others on the unit.  Patient remains safe at this time.  Problem: Education: Goal: Knowledge of [place] [facility] Education information/materials will improve Outcome: Progressing Goal: Emotional status will improve Outcome: Progressing Goal: Mental status will improve Outcome: Progressing Goal: Verbalization of understanding the information provided will improve Outcome: Progressing   Problem: Safety: Goal: Periods of time without injury will increase Outcome: Progressing   Problem: Education: Goal: Knowledge of the prescribed therapeutic regimen will improve Outcome: Progressing   Problem: Safety: Goal: Ability to disclose and discuss suicidal ideas will improve Outcome: Progressing Goal: Ability to identify and utilize support systems that promote safety will improve Outcome: Progressing

## 2020-06-08 NOTE — Progress Notes (Signed)
Recreation Therapy Notes  Date: 06/08/2020  Time: 9:30 am   Location: Outside   Behavioral response: N/A   Intervention Topic: Strengths   Discussion/Intervention: Patient did not attend group.   Clinical Observations/Feedback:  Patient did not attend group.   Kamoria Lucien LRT/CTRS        Analis Distler 06/08/2020 11:32 AM

## 2020-06-08 NOTE — Progress Notes (Signed)
[facility] MD Progress Note  06/08/2020 5:40 PM [name]  MRN:  [id number] Subjective: Follow-up for 34 year old woman pregnant depressed abusing drugs.  Patient is feeling better today.  Denies feeling depressed.  Pain in abdomen is improved.  Denies suicidal ideation.  Has been talking with the treatment team about disposition options Principal Problem: Severe recurrent major depression without psychotic features ([place]) Diagnosis: Principal Problem:   Severe recurrent major depression without psychotic features ([place]) Active Problems:   Stimulant use disorder (HCC) (cocaine)   Alcohol use disorder, moderate, dependence ([place])   Pregnant   Cocaine use disorder, moderate, dependence ([place])   History of preterm labor, current pregnancy, second trimester  Total Time spent with patient: 30 minutes  Past Psychiatric History: Past history of recurrent substance abuse and depression  Past Medical History:  Past Medical History:  Diagnosis Date  . Anxiety   . Depression   . History of substance abuse ([place])   . History of suicide attempt   . History of thyroid disease   . Hypertension   . Thyroid disease     Past Surgical History:  Procedure Laterality Date  . CESAREAN SECTION  2000, 2002, 2013   Family History:  Family History  Problem Relation Age of Onset  . Diabetes Mother    Family Psychiatric  History: See previous Social History:  Social History   Substance and Sexual Activity  Alcohol Use Yes   Comment: social drinker     Social History   Substance and Sexual Activity  Drug Use Not Currently  . Types: Cocaine, Marijuana   Comment: Positive UDS 12/2017    Social History   Socioeconomic History  . Marital status: Single    Spouse name: Not on file  . Number of children: Not on file  . Years of education: Not on file  . Highest education level: Not on file  Occupational History  . Not on file  Tobacco Use  . Smoking status: Current Every Day Smoker     Packs/day: 0.50  . Smokeless tobacco: Never Used  Substance and Sexual Activity  . Alcohol use: Yes    Comment: social drinker  . Drug use: Not Currently    Types: Cocaine, Marijuana    Comment: Positive UDS 12/2017  . Sexual activity: Not Currently    Birth control/protection: None  Other Topics Concern  . Not on file  Social History Narrative  . Not on file   Social Determinants of Health   Financial Resource Strain:   . Difficulty of Paying Living Expenses:   Food Insecurity:   . Worried About Charity fundraiser in the Last Year:   . Arboriculturist in the Last Year:   Transportation Needs:   . Film/video editor (Medical):   Marland Kitchen Lack of Transportation (Non-Medical):   Physical Activity:   . Days of Exercise per Week:   . Minutes of Exercise per Session:   Stress:   . Feeling of Stress :   Social Connections:   . Frequency of Communication with Friends and Family:   . Frequency of Social Gatherings with Friends and Family:   . Attends Religious Services:   . Active Member of Clubs or Organizations:   . Attends Archivist Meetings:   Marland Kitchen Marital Status:    Additional Social History:                         Sleep: Fair  Appetite:  Fair  Current Medications: Current Facility-Administered Medications  Medication Dose Route Frequency Provider Last Rate Last Admin  . acetaminophen (TYLENOL) tablet 650 mg  650 mg Oral Q6H PRN Kamarius Buckbee, Jackquline Denmark, MD   650 mg at 06/08/20 1251  . alum & mag hydroxide-simeth (MAALOX/MYLANTA) 200-200-20 MG/5ML suspension 30 mL  30 mL Oral Q4H PRN Parrish Daddario T, MD      . docusate sodium (COLACE) capsule 200 mg  200 mg Oral BID Haruna Rohlfs, Jackquline Denmark, MD   200 mg at 06/08/20 0831  . magnesium hydroxide (MILK OF MAGNESIA) suspension 30 mL  30 mL Oral Daily PRN Diarra Kos, Jackquline Denmark, MD   30 mL at 06/05/20 0751  . polyethylene glycol (MIRALAX / GLYCOLAX) packet 17 g  17 g Oral Daily Antonieta Pert, MD   17 g at 06/06/20 1146  .  prenatal multivitamin tablet 1 tablet  1 tablet Oral Q1200 Antonieta Pert, MD   1 tablet at 06/08/20 1140  . sertraline (ZOLOFT) tablet 50 mg  50 mg Oral Daily Mariam Helbert, Jackquline Denmark, MD   50 mg at 06/08/20 0831  . traZODone (DESYREL) tablet 100 mg  100 mg Oral QHS Lucette Kratz, Jackquline Denmark, MD   100 mg at 06/07/20 2121    Lab Results: No results found for this or any previous visit (from the past 48 hour(s)).  Blood Alcohol level:  Lab Results  Component Value Date   ETH 89 (H) 06/02/2020   ETH <10 05/11/2020    Metabolic Disorder Labs: Lab Results  Component Value Date   HGBA1C 5.5 06/05/2020   MPG 111.15 06/05/2020   MPG 99.67 01/02/2018   No results found for: PROLACTIN Lab Results  Component Value Date   CHOL 218 (H) 01/02/2018   TRIG 187 (H) 01/02/2018   HDL 81 01/02/2018   CHOLHDL 2.7 01/02/2018   VLDL 37 01/02/2018   LDLCALC 100 (H) 01/02/2018   LDLCALC 82 01/12/2016    Physical Findings: AIMS:  , ,  ,  ,    CIWA:    COWS:     Musculoskeletal: Strength & Muscle Tone: within normal limits Gait & Station: normal Patient leans: N/A  Psychiatric Specialty Exam: Physical Exam  Nursing note and vitals reviewed. Constitutional: She appears well-developed.  HENT:  Head: Normocephalic and atraumatic.  Eyes: Pupils are equal, round, and reactive to light. Conjunctivae are normal.  Cardiovascular: Normal heart sounds.  Respiratory: Effort normal.  GI: Soft.  Musculoskeletal:        General: Normal range of motion.     Cervical back: Normal range of motion.  Neurological: She is alert.  Skin: Skin is warm and dry.  Psychiatric: Mood normal.    Review of Systems  Constitutional: Negative.   HENT: Negative.   Eyes: Negative.   Respiratory: Negative.   Cardiovascular: Negative.   Gastrointestinal: Negative.   Musculoskeletal: Negative.   Skin: Negative.   Neurological: Negative.   Psychiatric/Behavioral: Negative.     Blood pressure 112/60, pulse 70, temperature 98  F (36.7 C), temperature source Oral, resp. rate 18, height 4\' 11"  (1.499 m), weight 72.1 kg, last menstrual period 12/04/2019, SpO2 100 %.Body mass index is 32.11 kg/m.  General Appearance: Casual  Eye Contact:  Good  Speech:  Clear and Coherent  Volume:  Normal  Mood:  Euthymic  Affect:  Congruent  Thought Process:  Coherent  Orientation:  Full (Time, Place, and Person)  Thought Content:  Logical  Suicidal Thoughts:  No  Homicidal Thoughts:  No  Memory:  Immediate;   Fair Recent;   Fair Remote;   Fair  Judgement:  Fair  Insight:  Fair  Psychomotor Activity:  Normal  Concentration:  Concentration: Fair  Recall:  [employer] of Knowledge:  Fair  Language:  Fair  Akathisia:  No  Handed:  Right  AIMS (if indicated):     Assets:  Desire for Improvement  ADL's:  Intact  Cognition:  WNL  Sleep:  Number of Hours: 7.75     Treatment Plan Summary: Plan Continue current medication.  The patient has been cooperating with looking into discharge options including calling [facility].  Work on finding some safe plan for disposition  Mordecai Rasmussen, MD 06/08/2020, 5:40 PM

## 2020-06-08 NOTE — Progress Notes (Signed)
Care of patient taken over at 2300 and she seemed to sleep well with no issues to report on shift at this time. 

## 2020-06-08 NOTE — Progress Notes (Signed)
   06/08/20 1400  Clinical Encounter Type  Visited With Patient  Visit Type Initial;Behavioral Health;Social support;Spiritual support  Referral From Chaplain  Consult/Referral To Chaplain  Visited with Pt in Tuesday Ch group. Pt was open and honest about her drinking problem. Pt said she had three kids and one on the way. Pt told me she had a kid in foster care and she wanted to get sober so that she get her kids home. Ch will follow-up with Pt.

## 2020-06-08 NOTE — Plan of Care (Signed)
D- Patient alert and oriented. Patient presents in a pleasant mood on assessment stating that she slept good last night and had no complaints to voice to this Clinical research associate. Patient denies SI, HI, AVH, and pain at this time. Patient also denies any signs/symptoms of depression and anxiety stating "I feel good this morning". Patient had no stated goals for today.  A- Scheduled medications administered to patient, per MD orders. Support and encouragement provided.  Routine safety checks conducted every 15 minutes.  Patient informed to notify staff with problems or concerns.  R- No adverse drug reactions noted. Patient contracts for safety at this time. Patient compliant with medications and treatment plan. Patient receptive, calm, and cooperative. Patient interacts well with others on the unit.  Patient remains safe at this time.  Problem: Education: Goal: Knowledge of [place] [facility] Education information/materials will improve Outcome: Progressing Goal: Emotional status will improve Outcome: Progressing Goal: Mental status will improve Outcome: Progressing Goal: Verbalization of understanding the information provided will improve Outcome: Progressing   Problem: Safety: Goal: Periods of time without injury will increase Outcome: Progressing   Problem: Education: Goal: Knowledge of the prescribed therapeutic regimen will improve Outcome: Progressing   Problem: Safety: Goal: Ability to disclose and discuss suicidal ideas will improve Outcome: Progressing Goal: Ability to identify and utilize support systems that promote safety will improve Outcome: Progressing

## 2020-06-08 NOTE — BHH Group Notes (Signed)
LCSW Group Therapy Note   06/08/2020 3:01 PM  Type of Therapy and Topic:  Group Therapy:  Overcoming Obstacles   Participation Level:  Active   Description of Group:    In this group patients will be encouraged to explore what they see as obstacles to their own wellness and recovery. They will be guided to discuss their thoughts, feelings, and behaviors related to these obstacles. The group will process together ways to cope with barriers, with attention given to specific choices patients can make. Each patient will be challenged to identify changes they are motivated to make in order to overcome their obstacles. This group will be process-oriented, with patients participating in exploration of their own experiences as well as giving and receiving support and challenge from other group members.   Therapeutic Goals: 1. Patient will identify personal and current obstacles as they relate to admission. 2. Patient will identify barriers that currently interfere with their wellness or overcoming obstacles.  3. Patient will identify feelings, thought process and behaviors related to these barriers. 4. Patient will identify two changes they are willing to make to overcome these obstacles:      Summary of Patient Progress Pt was present for group.  Patient has to be redirected several times to not interrupt.  Patient expressed "relationships" and "finances" as being obstacles towards meeting her goals.  She engaged in her obstacles have resulted in loss of friends.    Therapeutic Modalities:   Cognitive Behavioral Therapy Solution Focused Therapy Motivational Interviewing Relapse Prevention Therapy  Penni Homans, MSW, LCSW 06/08/2020 3:01 PM

## 2020-06-09 ENCOUNTER — Inpatient Hospital Stay: Payer: Medicaid Other

## 2020-06-09 NOTE — Progress Notes (Signed)
Pt has been writing in what she wants for her meal and upset when she does not get it. Torrie Mayers RN

## 2020-06-09 NOTE — Progress Notes (Signed)
D- Patient alert and oriented. Affect/mood is appropriate to circumstance and pleasant. Denies SI, HI, AVH, and pain. Pt states "I feel alright". Pt wrote in what she wanted to eat on her menu and was upset that she did not get "pizza".   A- Scheduled medications administered to patient, per MD orders. Support and encouragement provided.  Routine safety checks conducted every 15 minutes.   Patient informed to notify staff with problems or concerns.  R- No adverse drug reactions noted. Patient contracts for safety at this time. Patient compliant with medications and treatment plan. Patient receptive, calm, and cooperative. Patient interacts well with others on the unit.  Patient remains safe at this time.   Torrie Mayers RN

## 2020-06-09 NOTE — Progress Notes (Signed)
Recreation Therapy Notes    Date: 06/09/2020  Time: 9:30 am   Location: Craft room    Behavioral response: N/A   Intervention Topic: Decision-making   Discussion/Intervention: Patient did not attend group.   Clinical Observations/Feedback:  Patient did not attend group.   Andilyn Bettcher LRT/CTRS        Laval Cafaro 06/09/2020 11:55 AM

## 2020-06-09 NOTE — BHH Counselor (Signed)
CSW received call from Wilder Glade [phone] at [facility].    She reports that at this time the pt is under review.  She reports that cases are only reviewed on Monday, so no answer can be provided if pt is accepted until Monday 06/14/2020.  CSW answered any questions that CSW had knowledge of to assist referral.  Penni Homans, MSW, LCSW 06/09/2020 11:45 AM

## 2020-06-09 NOTE — Progress Notes (Signed)
Patient alert and oriented x 4, affect is blunted thoughts are organized and coherent. Patient's denies SI/HI/AVH interacting appropriately with peers and staff she c/o back pain and was medicated with tylenol PRN as needed  she was receptive to staff, 15 minutes safety checks maintained will continue to monitor °

## 2020-06-09 NOTE — Progress Notes (Signed)
[facility] MD Progress Note  06/09/2020 4:43 PM [name]  MRN:  [id number] Subjective: Follow-up for this patient with substance abuse depression and pregnancy.  Patient says her mood feels better today.  Denies any suicidal thoughts.  Denies any psychotic symptoms.  Feels optimistic about trying to find inpatient treatment Principal Problem: Severe recurrent major depression without psychotic features (HCC) Diagnosis: Principal Problem:   Severe recurrent major depression without psychotic features (HCC) Active Problems:   Stimulant use disorder (HCC) (cocaine)   Alcohol use disorder, moderate, dependence (HCC)   Pregnant   Cocaine use disorder, moderate, dependence (HCC)   History of preterm labor, current pregnancy, second trimester  Total Time spent with patient: 30 minutes  Past Psychiatric History: Past history of depression and substance abuse  Past Medical History:  Past Medical History:  Diagnosis Date  . Anxiety   . Depression   . History of substance abuse (HCC)   . History of suicide attempt   . History of thyroid disease   . Hypertension   . Thyroid disease     Past Surgical History:  Procedure Laterality Date  . CESAREAN SECTION  2000, 2002, 2013   Family History:  Family History  Problem Relation Age of Onset  . Diabetes Mother    Family Psychiatric  History: See previous Social History:  Social History   Substance and Sexual Activity  Alcohol Use Yes   Comment: social drinker     Social History   Substance and Sexual Activity  Drug Use Not Currently  . Types: Cocaine, Marijuana   Comment: Positive UDS 12/2017    Social History   Socioeconomic History  . Marital status: Single    Spouse name: Not on file  . Number of children: Not on file  . Years of education: Not on file  . Highest education level: Not on file  Occupational History  . Not on file  Tobacco Use  . Smoking status: Current Every Day Smoker    Packs/day: 0.50  .  Smokeless tobacco: Never Used  Substance and Sexual Activity  . Alcohol use: Yes    Comment: social drinker  . Drug use: Not Currently    Types: Cocaine, Marijuana    Comment: Positive UDS 12/2017  . Sexual activity: Not Currently    Birth control/protection: None  Other Topics Concern  . Not on file  Social History Narrative  . Not on file   Social Determinants of Health   Financial Resource Strain:   . Difficulty of Paying Living Expenses:   Food Insecurity:   . Worried About Programme researcher, broadcasting/film/video in the Last Year:   . Barista in the Last Year:   Transportation Needs:   . Freight forwarder (Medical):   Marland Kitchen Lack of Transportation (Non-Medical):   Physical Activity:   . Days of Exercise per Week:   . Minutes of Exercise per Session:   Stress:   . Feeling of Stress :   Social Connections:   . Frequency of Communication with Friends and Family:   . Frequency of Social Gatherings with Friends and Family:   . Attends Religious Services:   . Active Member of Clubs or Organizations:   . Attends Banker Meetings:   Marland Kitchen Marital Status:    Additional Social History:                         Sleep: Fair  Appetite:  Fair  Current Medications: Current Facility-Administered Medications  Medication Dose Route Frequency Provider Last Rate Last Admin  . acetaminophen (TYLENOL) tablet 650 mg  650 mg Oral Q6H PRN Louie Flenner, Jackquline Denmark, MD   650 mg at 06/09/20 1358  . alum & mag hydroxide-simeth (MAALOX/MYLANTA) 200-200-20 MG/5ML suspension 30 mL  30 mL Oral Q4H PRN Julieana Eshleman T, MD      . docusate sodium (COLACE) capsule 200 mg  200 mg Oral BID Leanah Kolander, Jackquline Denmark, MD   200 mg at 06/09/20 0801  . magnesium hydroxide (MILK OF MAGNESIA) suspension 30 mL  30 mL Oral Daily PRN Aniyia Rane, Jackquline Denmark, MD   30 mL at 06/05/20 0751  . polyethylene glycol (MIRALAX / GLYCOLAX) packet 17 g  17 g Oral Daily Antonieta Pert, MD   17 g at 06/09/20 0803  . prenatal multivitamin  tablet 1 tablet  1 tablet Oral Q1200 Antonieta Pert, MD   1 tablet at 06/09/20 1237  . sertraline (ZOLOFT) tablet 50 mg  50 mg Oral Daily Bali Lyn, Jackquline Denmark, MD   50 mg at 06/09/20 0802  . traZODone (DESYREL) tablet 100 mg  100 mg Oral QHS Nikiya Starn, Jackquline Denmark, MD   100 mg at 06/08/20 2151    Lab Results: No results found for this or any previous visit (from the past 48 hour(s)).  Blood Alcohol level:  Lab Results  Component Value Date   ETH 89 (H) 06/02/2020   ETH <10 05/11/2020    Metabolic Disorder Labs: Lab Results  Component Value Date   HGBA1C 5.5 06/05/2020   MPG 111.15 06/05/2020   MPG 99.67 01/02/2018   No results found for: PROLACTIN Lab Results  Component Value Date   CHOL 218 (H) 01/02/2018   TRIG 187 (H) 01/02/2018   HDL 81 01/02/2018   CHOLHDL 2.7 01/02/2018   VLDL 37 01/02/2018   LDLCALC 100 (H) 01/02/2018   LDLCALC 82 01/12/2016    Physical Findings: AIMS:  , ,  ,  ,    CIWA:    COWS:     Musculoskeletal: Strength & Muscle Tone: within normal limits Gait & Station: normal Patient leans: N/A  Psychiatric Specialty Exam: Physical Exam  Nursing note and vitals reviewed. Constitutional: She appears well-developed.  HENT:  Head: Normocephalic and atraumatic.  Eyes: Pupils are equal, round, and reactive to light. Conjunctivae are normal.  Cardiovascular: Normal heart sounds.  Respiratory: Effort normal.  GI: Soft.  Musculoskeletal:        General: Normal range of motion.     Cervical back: Normal range of motion.  Neurological: She is alert.  Skin: Skin is warm and dry.  Psychiatric: Mood normal.    Review of Systems  Constitutional: Negative.   HENT: Negative.   Eyes: Negative.   Respiratory: Negative.   Cardiovascular: Negative.   Gastrointestinal: Negative.   Musculoskeletal: Negative.   Skin: Negative.   Neurological: Negative.   Psychiatric/Behavioral: Negative.     Blood pressure (!) 106/50, pulse 73, temperature 98.3 F (36.8 C),  temperature source Oral, resp. rate 18, height 4\' 11"  (1.499 m), weight 72.1 kg, last menstrual period 12/04/2019, SpO2 100 %.Body mass index is 32.11 kg/m.  General Appearance: Casual  Eye Contact:  Good  Speech:  Clear and Coherent  Volume:  Normal  Mood:  Euthymic  Affect:  Congruent  Thought Process:  Goal Directed  Orientation:  Full (Time, Place, and Person)  Thought Content:  Logical  Suicidal Thoughts:  No  Homicidal Thoughts:  No  Memory:  Immediate;   Fair Recent;   Fair Remote;   Fair  Judgement:  Fair  Insight:  Fair  Psychomotor Activity:  Normal  Concentration:  Concentration: Fair  Recall:  [employer] of Knowledge:  Fair  Language:  Fair  Akathisia:  No  Handed:  Right  AIMS (if indicated):     Assets:  Desire for Improvement  ADL's:  Intact  Cognition:  WNL  Sleep:  Number of Hours: 6     Treatment Plan Summary: Daily contact with patient to assess and evaluate symptoms and progress in treatment, Medication management and Plan Continue current medicine.  At nursing request got an ultrasound today.  Patient is awaiting call backs from inpatient substance abuse facilities  Alethia Berthold, MD 06/09/2020, 4:43 PM

## 2020-06-10 MED ORDER — SERTRALINE HCL 25 MG PO TABS
75.0000 mg | ORAL_TABLET | Freq: Every day | ORAL | Status: DC
  Administered 2020-06-11 – 2020-06-14 (×4): 75 mg via ORAL
  Filled 2020-06-10 (×4): qty 3

## 2020-06-10 NOTE — Progress Notes (Signed)
Recreation Therapy Notes  Date: 06/10/2020  Time: 9:30 am   Location: Room 21   Behavioral response: N/A   Intervention Topic: Animal Assisted therapy    Discussion/Intervention: Patient did not attend group.   Clinical Observations/Feedback:  Patient did not attend group.   Kimley Apsey LRT/CTRS        Marcelline Temkin 06/10/2020 11:00 AM

## 2020-06-10 NOTE — Progress Notes (Signed)
BRIEF PHARMACY NOTE   This patient attended and participated in Medication Management Group counseling led by [facility] staff pharmacist.  This interactive class reviews basic information about prescription medications and education on personal responsibility in medication management.  The class also includes general knowledge of 3 main classes of behavioral medications, including antipsychotics, antidepressants, and mood stabilizers.     Patient behavior was appropriate for group setting.   Educational materials sourced from:  "Medication Do's and Don'ts" from WWW.MED-PASS.COM   "Mental Health Medications" from National Institute of Mental Health Https://www.nimh.nih.gov/health/topics/mental-health-medications/index.shtml#part 149856    Andra Matsuo M Jack Mineau, PharmD, BCPS Clinical Pharmacist 06/10/2020 2:52 PM  

## 2020-06-10 NOTE — Progress Notes (Signed)
[facility] MD Progress Note  06/10/2020 3:29 PM [name]  MRN:  [id number] Subjective: Patient seen chart reviewed.  Patient today says she is not feeling good.  She has been having intrusive thoughts about the death of her mother.  She is more tearful today.  Spending more time in bed.  Remains appropriate in her behavior.  No active suicidal thoughts.  Still having daily aches and pains around the pregnancy. Principal Problem: Severe recurrent major depression without psychotic features ([place]) Diagnosis: Principal Problem:   Severe recurrent major depression without psychotic features ([place]) Active Problems:   Stimulant use disorder (HCC) (cocaine)   Alcohol use disorder, moderate, dependence (HCC)   Pregnant   Cocaine use disorder, moderate, dependence (HCC)   History of preterm labor, current pregnancy, second trimester  Total Time spent with patient: 30 minutes  Past Psychiatric History: Past history of recurrent substance abuse and mood problems  Past Medical History:  Past Medical History:  Diagnosis Date  . Anxiety   . Depression   . History of substance abuse ([place])   . History of suicide attempt   . History of thyroid disease   . Hypertension   . Thyroid disease     Past Surgical History:  Procedure Laterality Date  . CESAREAN SECTION  2000, 2002, 2013   Family History:  Family History  Problem Relation Age of Onset  . Diabetes Mother    Family Psychiatric  History: See previous Social History:  Social History   Substance and Sexual Activity  Alcohol Use Yes   Comment: social drinker     Social History   Substance and Sexual Activity  Drug Use Not Currently  . Types: Cocaine, Marijuana   Comment: Positive UDS 12/2017    Social History   Socioeconomic History  . Marital status: Single    Spouse name: Not on file  . Number of children: Not on file  . Years of education: Not on file  . Highest education level: Not on file  Occupational History  .  Not on file  Tobacco Use  . Smoking status: Current Every Day Smoker    Packs/day: 0.50  . Smokeless tobacco: Never Used  Substance and Sexual Activity  . Alcohol use: Yes    Comment: social drinker  . Drug use: Not Currently    Types: Cocaine, Marijuana    Comment: Positive UDS 12/2017  . Sexual activity: Not Currently    Birth control/protection: None  Other Topics Concern  . Not on file  Social History Narrative  . Not on file   Social Determinants of Health   Financial Resource Strain:   . Difficulty of Paying Living Expenses:   Food Insecurity:   . Worried About Charity fundraiser in the Last Year:   . Arboriculturist in the Last Year:   Transportation Needs:   . Film/video editor (Medical):   Marland Kitchen Lack of Transportation (Non-Medical):   Physical Activity:   . Days of Exercise per Week:   . Minutes of Exercise per Session:   Stress:   . Feeling of Stress :   Social Connections:   . Frequency of Communication with Friends and Family:   . Frequency of Social Gatherings with Friends and Family:   . Attends Religious Services:   . Active Member of Clubs or Organizations:   . Attends Archivist Meetings:   Marland Kitchen Marital Status:    Additional Social History:  Sleep: Fair  Appetite:  Fair  Current Medications: Current Facility-Administered Medications  Medication Dose Route Frequency Provider Last Rate Last Admin  . acetaminophen (TYLENOL) tablet 650 mg  650 mg Oral Q6H PRN Pailynn Vahey, Jackquline Denmark, MD   650 mg at 06/10/20 1522  . alum & mag hydroxide-simeth (MAALOX/MYLANTA) 200-200-20 MG/5ML suspension 30 mL  30 mL Oral Q4H PRN Tahani Potier T, MD      . docusate sodium (COLACE) capsule 200 mg  200 mg Oral BID Ahja Martello, Jackquline Denmark, MD   200 mg at 06/09/20 0801  . magnesium hydroxide (MILK OF MAGNESIA) suspension 30 mL  30 mL Oral Daily PRN Joren Rehm, Jackquline Denmark, MD   30 mL at 06/05/20 0751  . polyethylene glycol (MIRALAX / GLYCOLAX) packet 17  g  17 g Oral Daily Antonieta Pert, MD   17 g at 06/06/20 1146  . prenatal multivitamin tablet 1 tablet  1 tablet Oral Q1200 Antonieta Pert, MD   1 tablet at 06/10/20 1148  . [START ON 06/11/2020] sertraline (ZOLOFT) tablet 75 mg  75 mg Oral Daily Elga Santy T, MD      . traZODone (DESYREL) tablet 100 mg  100 mg Oral QHS Jenifer Struve, Jackquline Denmark, MD   100 mg at 06/09/20 2204    Lab Results: No results found for this or any previous visit (from the past 48 hour(s)).  Blood Alcohol level:  Lab Results  Component Value Date   ETH 89 (H) 06/02/2020   ETH <10 05/11/2020    Metabolic Disorder Labs: Lab Results  Component Value Date   HGBA1C 5.5 06/05/2020   MPG 111.15 06/05/2020   MPG 99.67 01/02/2018   No results found for: PROLACTIN Lab Results  Component Value Date   CHOL 218 (H) 01/02/2018   TRIG 187 (H) 01/02/2018   HDL 81 01/02/2018   CHOLHDL 2.7 01/02/2018   VLDL 37 01/02/2018   LDLCALC 100 (H) 01/02/2018   LDLCALC 82 01/12/2016    Physical Findings: AIMS:  , ,  ,  ,    CIWA:    COWS:     Musculoskeletal: Strength & Muscle Tone: within normal limits Gait & Station: normal Patient leans: N/A  Psychiatric Specialty Exam: Physical Exam  Nursing note and vitals reviewed. Constitutional: She appears well-developed.  HENT:  Head: Normocephalic and atraumatic.  Eyes: Pupils are equal, round, and reactive to light. Conjunctivae are normal.  Cardiovascular: Normal heart sounds.  Respiratory: Effort normal.  GI: Soft.  Musculoskeletal:        General: Normal range of motion.     Cervical back: Normal range of motion.  Neurological: She is alert.  Skin: Skin is warm and dry.  Psychiatric: Her speech is normal and behavior is normal. Judgment and thought content normal. She exhibits a depressed mood.    Review of Systems  Constitutional: Negative.   HENT: Negative.   Eyes: Negative.   Respiratory: Negative.   Cardiovascular: Negative.   Gastrointestinal:  Negative.   Musculoskeletal: Negative.   Skin: Negative.   Neurological: Negative.   Psychiatric/Behavioral: Positive for dysphoric mood.    Blood pressure (!) 106/50, pulse 73, temperature 98.3 F (36.8 C), temperature source Oral, resp. rate 18, height 4\' 11"  (1.499 m), weight 72.1 kg, last menstrual period 12/04/2019, SpO2 100 %.Body mass index is 32.11 kg/m.  General Appearance: Casual  Eye Contact:  Good  Speech:  Clear and Coherent  Volume:  Decreased  Mood:  Depressed and Dysphoric  Affect:  Congruent  and Depressed  Thought Process:  Goal Directed  Orientation:  Full (Time, Place, and Person)  Thought Content:  Logical  Suicidal Thoughts:  No  Homicidal Thoughts:  No  Memory:  Immediate;   Fair Recent;   Fair Remote;   Fair  Judgement:  Fair  Insight:  Fair  Psychomotor Activity:  Normal  Concentration:  Concentration: Fair  Recall:  [employer] of Knowledge:  Fair  Language:  Fair  Akathisia:  No  Handed:  Right  AIMS (if indicated):     Assets:  Desire for Improvement  ADL's:  Impaired  Cognition:  Impaired,  Mild  Sleep:  Number of Hours: 6.75     Treatment Plan Summary: Daily contact with patient to assess and evaluate symptoms and progress in treatment, Medication management and Plan Supportive therapy.  Reassured patient that this sort of thing was not unusual with grief.  Offered sympathy.  Encouraged her to take it easy.  I am going to increase her Zoloft to 75 mg a day.  We are still working on trying to find some kind of appropriate living situation or treatment facility for her.  Mordecai Rasmussen, MD 06/10/2020, 3:29 PM

## 2020-06-10 NOTE — BHH Group Notes (Signed)
Balance In Life 06/10/2020 9:30AM/1PM  Type of Therapy/Topic:  Group Therapy:  Balance in Life  Participation Level:  None  Description of Group:   This group will address the concept of balance and how it feels and looks when one is unbalanced. Patients will be encouraged to process areas in their lives that are out of balance and identify reasons for remaining unbalanced. Facilitators will guide patients in utilizing problem-solving interventions to address and correct the stressor making their life unbalanced. Understanding and applying boundaries will be explored and addressed for obtaining and maintaining a balanced life. Patients will be encouraged to explore ways to assertively make their unbalanced needs known to significant others in their lives, using other group members and facilitator for support and feedback.  Therapeutic Goals: 1. Patient will identify two or more emotions or situations they have that consume much of in their lives. 2. Patient will identify signs/triggers that life has become out of balance:  3. Patient will identify two ways to set boundaries in order to achieve balance in their lives:  4. Patient will demonstrate ability to communicate their needs through discussion and/or role plays  Summary of Patient Progress: Pt came to group session but left the group early. No participation during group session. When group discussed the harmful effects of drugs and alcohol and ways it can cause imbalance Pt stated to facilitator "you dont know what youre talking about,I feel good when I use drugs." Pt then left group session and did not return.   Therapeutic Modalities:   Cognitive Behavioral Therapy Solution-Focused Therapy Assertiveness Training  Darianne Muralles Philip Aspen, LCSW

## 2020-06-10 NOTE — BHH Counselor (Signed)
[facility] [employer], [place], called to inform that the patient is not likely to be accepted into the program.  She reports concerns of pt's ability to adjust to th groups and concern for the persistent suicidal ideation.  She reports that the patient has contacted her and stated that she "doesn't have a drug problem" which is a concern for them as they are substance based program.  Penni Homans, MSW, LCSW 06/10/2020 3:24 PM

## 2020-06-10 NOTE — BHH Group Notes (Signed)
B[facility] Group Notes:  (Nursing/MHT/Case Management/Adjunct)  Date:  06/10/2020  Time:  9:25 PM  Type of Therapy:  Group Therapy  Participation Level:  Active  Participation Quality:  Monopolizing  Affect:  Appropriate  Cognitive:  Alert  Insight:  Good  Engagement in Group:  Engaged and patient said her goal was to wash her butt today.  Modes of Intervention:  Support  Summary of Progress/Problems:  [name] 06/10/2020, 9:25 PM

## 2020-06-10 NOTE — Progress Notes (Signed)
Patient alert and oriented x 4, affect is blunted thoughts are organized and coherent. Patient's denies SI/HI/AVH interacting appropriately with peers and staff she c/o pelvic pain and back pain,  was medicated with tylenol PRN as needed  she was receptive to staff, expressed that she does not feel constipated anymore,  15 minutes safety checks maintained will continue to monitor.

## 2020-06-10 NOTE — Plan of Care (Signed)
Data: Patient is appropriate and cooperative to assessment. Patient denies SI/HI/AVH. Patient has not completed daily self inventory worksheet. Patient is adherent with scheduled medication. Patient is awaiting placement for discharge.   Action:  Q x 15 minute observation checks were completed for safety. Patient was provided with education on medications. Patient was offered support and encouragement. Patient was given scheduled medications. Patient  was encourage to attend groups, participate in unit activities and continue with plan of care.   Response: Patient has no complaints at this time. Patient is receptive to treatment and safety maintained on unit.    Problem: Education: Goal: Knowledge of [facility] Education information/materials will improve Outcome: Not Progressing Goal: Emotional status will improve Outcome: Not Progressing Goal: Mental status will improve Outcome: Not Progressing

## 2020-06-10 NOTE — BHH Counselor (Signed)
CSW called the following in an effort to find placement:  [facility], [phone] CSW spoke with Jake Shark.  Jake Shark reports that they do no partner with Medicaid and pt would be responsible for a $17,500 self-pay bill. He reports that the patient could be accepted due to reported alcohol use.  He provided the following fax number for information on the pt [phone].  [facility], [phone] CSW spoke with Idalia Needle who reports they are an outpatient facility.  [facility], [phone] Left HIPAA compliant voicemail.  [facility], [phone] Does not accept pregnant women.  [facility], [employer], [phone] Fax [phone] CSW spoke with [facility].  Paige sent the referral packet.  She reports that she needs the H&P, intake assessment, medication list.  She reports that one bed is open.  She reports that cases are staffed during treatment team on Wednesdays.  She states that patient may be better suited for [facility] and provided the following contact information P: [phone] and F: [phone].  Penni Homans, MSW, LCSW 06/10/2020 3:14 PM

## 2020-06-11 MED ORDER — WITCH HAZEL-GLYCERIN EX PADS
MEDICATED_PAD | CUTANEOUS | Status: DC | PRN
  Administered 2020-06-11: 2 via TOPICAL
  Administered 2020-06-13: 1 via TOPICAL
  Filled 2020-06-11: qty 100

## 2020-06-11 MED ORDER — HYDROCORTISONE 1 % EX CREA
TOPICAL_CREAM | Freq: Two times a day (BID) | CUTANEOUS | Status: DC
  Filled 2020-06-11: qty 28

## 2020-06-11 NOTE — Plan of Care (Signed)
D- Patient alert and oriented. Patient presents in a pleasant mood on assessment stating that she slept "alright" last night and had complaints of left hip pain, rating it a "10/10", in which she did request pain medication from this Clinical research associate. Patient denies SI, HI, AVH, and pain to this Clinical research associate. Patient also denies any signs/symptoms of depression/anxiety reporting that overall, "I feel alright". Patient had no stated goals for today.  A- Scheduled medications administered to patient, per MD orders. Support and encouragement provided.  Routine safety checks conducted every 15 minutes.  Patient informed to notify staff with problems or concerns.  R- No adverse drug reactions noted. Patient contracts for safety at this time. Patient compliant with medications and treatment plan. Patient receptive, calm, and cooperative. Patient interacts well with others on the unit.  Patient remains safe at this time.  Problem: Education: Goal: Knowledge of [facility] Education information/materials will improve Outcome: Progressing Goal: Emotional status will improve Outcome: Progressing Goal: Mental status will improve Outcome: Progressing Goal: Verbalization of understanding the information provided will improve Outcome: Progressing   Problem: Safety: Goal: Periods of time without injury will increase Outcome: Progressing   Problem: Education: Goal: Knowledge of the prescribed therapeutic regimen will improve Outcome: Progressing   Problem: Safety: Goal: Ability to disclose and discuss suicidal ideas will improve Outcome: Progressing Goal: Ability to identify and utilize support systems that promote safety will improve Outcome: Progressing

## 2020-06-11 NOTE — Progress Notes (Signed)
Patient alert and oriented x 4, affect is blunted thoughts are organized and coherent. Patient's denies SI/HI/AVH interacting appropriately with peers and staff she c/o back pain and was medicated with tylenol PRN as needed  she was receptive to staff, 15 minutes safety checks maintained will continue to monitor °

## 2020-06-11 NOTE — Progress Notes (Signed)
[facility] MD Progress Note  06/11/2020 4:37 PM [name]  MRN:  [id number] Subjective: Only new complaint today is her hemorrhoids.  Mood more stable today.  No suicidal thought. Principal Problem: Severe recurrent major depression without psychotic features ([place]) Diagnosis: Principal Problem:   Severe recurrent major depression without psychotic features ([place]) Active Problems:   Stimulant use disorder (HCC) (cocaine)   Alcohol use disorder, moderate, dependence (HCC)   Pregnant   Cocaine use disorder, moderate, dependence (HCC)   History of preterm labor, current pregnancy, second trimester  Total Time spent with patient: 15 minutes  Past Psychiatric History: Past history of substance abuse and mood symptoms  Past Medical History:  Past Medical History:  Diagnosis Date  . Anxiety   . Depression   . History of substance abuse ([place])   . History of suicide attempt   . History of thyroid disease   . Hypertension   . Thyroid disease     Past Surgical History:  Procedure Laterality Date  . CESAREAN SECTION  2000, 2002, 2013   Family History:  Family History  Problem Relation Age of Onset  . Diabetes Mother    Family Psychiatric  History: See previous Social History:  Social History   Substance and Sexual Activity  Alcohol Use Yes   Comment: social drinker     Social History   Substance and Sexual Activity  Drug Use Not Currently  . Types: Cocaine, Marijuana   Comment: Positive UDS 12/2017    Social History   Socioeconomic History  . Marital status: Single    Spouse name: Not on file  . Number of children: Not on file  . Years of education: Not on file  . Highest education level: Not on file  Occupational History  . Not on file  Tobacco Use  . Smoking status: Current Every Day Smoker    Packs/day: 0.50  . Smokeless tobacco: Never Used  Substance and Sexual Activity  . Alcohol use: Yes    Comment: social drinker  . Drug use: Not Currently    Types:  Cocaine, Marijuana    Comment: Positive UDS 12/2017  . Sexual activity: Not Currently    Birth control/protection: None  Other Topics Concern  . Not on file  Social History Narrative  . Not on file   Social Determinants of Health   Financial Resource Strain:   . Difficulty of Paying Living Expenses:   Food Insecurity:   . Worried About Charity fundraiser in the Last Year:   . Arboriculturist in the Last Year:   Transportation Needs:   . Film/video editor (Medical):   Marland Kitchen Lack of Transportation (Non-Medical):   Physical Activity:   . Days of Exercise per Week:   . Minutes of Exercise per Session:   Stress:   . Feeling of Stress :   Social Connections:   . Frequency of Communication with Friends and Family:   . Frequency of Social Gatherings with Friends and Family:   . Attends Religious Services:   . Active Member of Clubs or Organizations:   . Attends Archivist Meetings:   Marland Kitchen Marital Status:    Additional Social History:                         Sleep: Fair  Appetite:  Fair  Current Medications: Current Facility-Administered Medications  Medication Dose Route Frequency Provider Last Rate Last Admin  . acetaminophen (  TYLENOL) tablet 650 mg  650 mg Oral Q6H PRN Daksha Koone, Jackquline Denmark, MD   650 mg at 06/11/20 1518  . alum & mag hydroxide-simeth (MAALOX/MYLANTA) 200-200-20 MG/5ML suspension 30 mL  30 mL Oral Q4H PRN Odies Desa T, MD      . docusate sodium (COLACE) capsule 200 mg  200 mg Oral BID Brynda Heick, Jackquline Denmark, MD   200 mg at 06/11/20 0843  . magnesium hydroxide (MILK OF MAGNESIA) suspension 30 mL  30 mL Oral Daily PRN Atlee Kluth, Jackquline Denmark, MD   30 mL at 06/05/20 0751  . polyethylene glycol (MIRALAX / GLYCOLAX) packet 17 g  17 g Oral Daily Antonieta Pert, MD   17 g at 06/06/20 1146  . prenatal multivitamin tablet 1 tablet  1 tablet Oral Q1200 Antonieta Pert, MD   1 tablet at 06/11/20 1307  . sertraline (ZOLOFT) tablet 75 mg  75 mg Oral Daily Mida Cory,  Jackquline Denmark, MD   75 mg at 06/11/20 0843  . traZODone (DESYREL) tablet 100 mg  100 mg Oral QHS Cassie Shedlock, Jackquline Denmark, MD   100 mg at 06/10/20 2138    Lab Results: No results found for this or any previous visit (from the past 48 hour(s)).  Blood Alcohol level:  Lab Results  Component Value Date   ETH 89 (H) 06/02/2020   ETH <10 05/11/2020    Metabolic Disorder Labs: Lab Results  Component Value Date   HGBA1C 5.5 06/05/2020   MPG 111.15 06/05/2020   MPG 99.67 01/02/2018   No results found for: PROLACTIN Lab Results  Component Value Date   CHOL 218 (H) 01/02/2018   TRIG 187 (H) 01/02/2018   HDL 81 01/02/2018   CHOLHDL 2.7 01/02/2018   VLDL 37 01/02/2018   LDLCALC 100 (H) 01/02/2018   LDLCALC 82 01/12/2016    Physical Findings: AIMS:  , ,  ,  ,    CIWA:    COWS:     Musculoskeletal: Strength & Muscle Tone: within normal limits Gait & Station: normal Patient leans: N/A  Psychiatric Specialty Exam: Physical Exam  Nursing note and vitals reviewed. Constitutional: She appears well-developed.  HENT:  Head: Normocephalic and atraumatic.  Eyes: Pupils are equal, round, and reactive to light. Conjunctivae are normal.  Cardiovascular: Normal heart sounds.  Respiratory: Effort normal.  GI: Soft.  Musculoskeletal:        General: Normal range of motion.     Cervical back: Normal range of motion.  Neurological: She is alert.  Skin: Skin is warm and dry.  Psychiatric: Mood normal.    Review of Systems  Constitutional: Negative.   HENT: Negative.   Eyes: Negative.   Respiratory: Negative.   Cardiovascular: Negative.   Gastrointestinal: Negative.   Musculoskeletal: Negative.   Skin: Negative.   Neurological: Negative.   Psychiatric/Behavioral: Negative.     Blood pressure (!) 120/56, pulse 76, temperature 97.7 F (36.5 C), temperature source Oral, resp. rate 18, height 4\' 11"  (1.499 m), weight 72.1 kg, last menstrual period 12/04/2019, SpO2 100 %.Body mass index is 32.11  kg/m.  General Appearance: Casual  Eye Contact:  Good  Speech:  Clear and Coherent  Volume:  Normal  Mood:  Euthymic  Affect:  Congruent  Thought Process:  Goal Directed  Orientation:  Full (Time, Place, and Person)  Thought Content:  Logical  Suicidal Thoughts:  No  Homicidal Thoughts:  No  Memory:  Immediate;   Fair Recent;   Fair Remote;   Fair  Judgement:  Intact  Insight:  Fair  Psychomotor Activity:  Normal  Concentration:  Concentration: Fair  Recall:  [employer] of Knowledge:  Fair  Language:  Fair  Akathisia:  Negative  Handed:  Right  AIMS (if indicated):     Assets:  Desire for Improvement  ADL's:  Intact  Cognition:  WNL  Sleep:  Number of Hours: 7     Treatment Plan Summary: Daily contact with patient to assess and evaluate symptoms and progress in treatment, Medication management and Plan We can add any kind of symptomatic treatment for the hemorrhoids.  Continue current other medicine.  We are really hoping we can find some kind of living situation for her  Mordecai Rasmussen, MD 06/11/2020, 4:37 PM

## 2020-06-11 NOTE — BHH Group Notes (Signed)
LCSW Group Therapy Note  06/11/2020 1:00 PM  Type of Therapy/Topic:  Group Therapy:  Emotion Regulation  Participation Level:  Active   Description of Group:   The purpose of this group is to assist patients in learning to regulate negative emotions and experience positive emotions. Patients will be guided to discuss ways in which they have been vulnerable to their negative emotions. These vulnerabilities will be juxtaposed with experiences of positive emotions or situations, and patients will be challenged to use positive emotions to combat negative ones. Special emphasis will be placed on coping with negative emotions in conflict situations, and patients will process healthy conflict resolution skills.  Therapeutic Goals: 1. Patient will identify two positive emotions or experiences to reflect on in order to balance out negative emotions 2. Patient will label two or more emotions that they find the most difficult to experience 3. Patient will demonstrate positive conflict resolution skills through discussion and/or role plays  Summary of Patient Progress: Patient was present for group.  Patient shared that she was "sad" in group so she came out of her room to be around others.  She was unable to identify an effective coping skill that she uses.  She was not receptive to the coping skills presented in group.    Therapeutic Modalities:   Cognitive Behavioral Therapy Feelings Identification Dialectical Behavioral Therapy  Penni Homans, MSW, LCSW 06/11/2020 2:26 PM

## 2020-06-11 NOTE — BHH Group Notes (Signed)
B[facility] Group Notes:  (Nursing/MHT/Case Management/Adjunct)  Date:  06/11/2020  Time:  9:32 PM  Type of Therapy:  Group Therapy  Participation Level:  Active  Participation Quality:  Appropriate  Affect:  Appropriate  Cognitive:  Appropriate  Insight:  Appropriate  Engagement in Group:  Engaged  Modes of Intervention:  Discussion  Summary of Progress/Problems:  MHT engaged in night group activity asking patient what her goal was for the day and what is there greatest treasure, also providing snack. Patient responded by stating that her goal for the day was to poop which she obtained and her greatest treasure was her mother and her children. Patient was receptive to receiving snack.   Sharee Pimple 06/11/2020, 9:32 PM

## 2020-06-11 NOTE — Progress Notes (Signed)
Patient is easy to engage. Received meds without incident. Has complaint of back and hip pain rated at 7 for which she received Tylenol 650mg . Patient endorses depression only at this time, but his "working through it". She is safe with 15 minute safety checks. Informed to contact staff with any concerns

## 2020-06-11 NOTE — Progress Notes (Signed)
Recreation Therapy Notes  Date: 06/11/2020  Time: 9:30 am   Location: Craft room    Behavioral response: N/A   Intervention Topic: Happiness    Discussion/Intervention: Patient did not attend group.   Clinical Observations/Feedback:  Patient did not attend group.   [name] LRT/CTRS        [name] 06/11/2020 12:39 PM 

## 2020-06-12 NOTE — Progress Notes (Signed)
[facility] MD Progress Note  06/12/2020 12:18 PM Anajulia Leyendecker [place]  MRN:  [id number] Subjective: Patient is a 34 year old female with a past psychiatric history significant for depression and substance abuse issues who was admitted on 06/04/2020 with suicidal ideation and thoughts of overdosing or cutting herself.  Objective: Patient is seen and examined.  Patient is a 34 year old female with the above-stated past psychiatric history seen in follow-up.  She stated she feels more depressed today.  She does not want to talk.  I examined the patient approximately 7 days ago, and her examination is essentially unchanged at that time.  The electronic medical record of her notes from yesterday.  Show that it was felt she was worse yesterday than the day before.  They are continuing to find some form of a living facility for her.  She continues on Zoloft 75 mg p.o. daily which was increased on 06/11/2020.  She is also on trazodone as needed.  No new laboratories.  Are stable, she is afebrile.  She slept 6.5 hours last night.  Principal Problem: Severe recurrent major depression without psychotic features ([place]) Diagnosis: Principal Problem:   Severe recurrent major depression without psychotic features ([place]) Active Problems:   Stimulant use disorder (HCC) (cocaine)   Alcohol use disorder, moderate, dependence (HCC)   Pregnant   Cocaine use disorder, moderate, dependence (HCC)   History of preterm labor, current pregnancy, second trimester  Total Time spent with patient: 15 minutes  Past Psychiatric History: See admission H&P  Past Medical History:  Past Medical History:  Diagnosis Date  . Anxiety   . Depression   . History of substance abuse ([place])   . History of suicide attempt   . History of thyroid disease   . Hypertension   . Thyroid disease     Past Surgical History:  Procedure Laterality Date  . CESAREAN SECTION  2000, 2002, 2013   Family History:  Family History  Problem Relation Age of  Onset  . Diabetes Mother    Family Psychiatric  History: See admission H&P Social History:  Social History   Substance and Sexual Activity  Alcohol Use Yes   Comment: social drinker     Social History   Substance and Sexual Activity  Drug Use Not Currently  . Types: Cocaine, Marijuana   Comment: Positive UDS 12/2017    Social History   Socioeconomic History  . Marital status: Single    Spouse name: Not on file  . Number of children: Not on file  . Years of education: Not on file  . Highest education level: Not on file  Occupational History  . Not on file  Tobacco Use  . Smoking status: Current Every Day Smoker    Packs/day: 0.50  . Smokeless tobacco: Never Used  Substance and Sexual Activity  . Alcohol use: Yes    Comment: social drinker  . Drug use: Not Currently    Types: Cocaine, Marijuana    Comment: Positive UDS 12/2017  . Sexual activity: Not Currently    Birth control/protection: None  Other Topics Concern  . Not on file  Social History Narrative  . Not on file   Social Determinants of Health   Financial Resource Strain:   . Difficulty of Paying Living Expenses:   Food Insecurity:   . Worried About Charity fundraiser in the Last Year:   . Arboriculturist in the Last Year:   Transportation Needs:   . Film/video editor (Medical):   Marland Kitchen  Lack of Transportation (Non-Medical):   Physical Activity:   . Days of Exercise per Week:   . Minutes of Exercise per Session:   Stress:   . Feeling of Stress :   Social Connections:   . Frequency of Communication with Friends and Family:   . Frequency of Social Gatherings with Friends and Family:   . Attends Religious Services:   . Active Member of Clubs or Organizations:   . Attends Banker Meetings:   Marland Kitchen Marital Status:    Additional Social History:                         Sleep: Good  Appetite:  Fair  Current Medications: Current Facility-Administered Medications  Medication  Dose Route Frequency Provider Last Rate Last Admin  . acetaminophen (TYLENOL) tablet 650 mg  650 mg Oral Q6H PRN Clapacs, Jackquline Denmark, MD   650 mg at 06/11/20 2131  . alum & mag hydroxide-simeth (MAALOX/MYLANTA) 200-200-20 MG/5ML suspension 30 mL  30 mL Oral Q4H PRN Clapacs, John T, MD      . docusate sodium (COLACE) capsule 200 mg  200 mg Oral BID Clapacs, Jackquline Denmark, MD   200 mg at 06/12/20 8527  . hydrocortisone cream 1 %   Topical BID Clapacs, John T, MD      . magnesium hydroxide (MILK OF MAGNESIA) suspension 30 mL  30 mL Oral Daily PRN Clapacs, Jackquline Denmark, MD   30 mL at 06/05/20 0751  . polyethylene glycol (MIRALAX / GLYCOLAX) packet 17 g  17 g Oral Daily Antonieta Pert, MD   17 g at 06/06/20 1146  . prenatal multivitamin tablet 1 tablet  1 tablet Oral Q1200 Antonieta Pert, MD   1 tablet at 06/12/20 1138  . sertraline (ZOLOFT) tablet 75 mg  75 mg Oral Daily Clapacs, Jackquline Denmark, MD   75 mg at 06/12/20 0808  . traZODone (DESYREL) tablet 100 mg  100 mg Oral QHS Clapacs, John T, MD   100 mg at 06/11/20 2131  . witch hazel-glycerin (TUCKS) pad   Topical PRN Clapacs, Jackquline Denmark, MD   2 application at 06/11/20 1801    Lab Results: No results found for this or any previous visit (from the past 48 hour(s)).  Blood Alcohol level:  Lab Results  Component Value Date   ETH 89 (H) 06/02/2020   ETH <10 05/11/2020    Metabolic Disorder Labs: Lab Results  Component Value Date   HGBA1C 5.5 06/05/2020   MPG 111.15 06/05/2020   MPG 99.67 01/02/2018   No results found for: PROLACTIN Lab Results  Component Value Date   CHOL 218 (H) 01/02/2018   TRIG 187 (H) 01/02/2018   HDL 81 01/02/2018   CHOLHDL 2.7 01/02/2018   VLDL 37 01/02/2018   LDLCALC 100 (H) 01/02/2018   LDLCALC 82 01/12/2016    Physical Findings: AIMS:  , ,  ,  ,    CIWA:    COWS:     Musculoskeletal: Strength & Muscle Tone: within normal limits Gait & Station: normal Patient leans: N/A  Psychiatric Specialty Exam: Physical Exam   Nursing note and vitals reviewed. Constitutional: She is oriented to person, place, and time.  HENT:  Head: Normocephalic and atraumatic.  Respiratory: Effort normal.  Neurological: She is alert and oriented to person, place, and time.    Review of Systems  Blood pressure (!) 116/53, pulse 72, temperature 97.9 F (36.6 C), temperature source Oral,  resp. rate 18, height 4\' 11"  (1.499 m), weight 72.1 kg, last menstrual period 12/04/2019, SpO2 100 %.Body mass index is 32.11 kg/m.  General Appearance: Disheveled  Eye Contact:  Minimal  Speech:  Normal Rate  Volume:  Normal  Mood:  Anxious, Depressed and Dysphoric  Affect:  Congruent  Thought Process:  Coherent and Descriptions of Associations: Circumstantial  Orientation:  Full (Time, Place, and Person)  Thought Content:  Rumination  Suicidal Thoughts:  No  Homicidal Thoughts:  No  Memory:  Immediate;   Poor Recent;   Poor Remote;   Poor  Judgement:  Impaired  Insight:  Lacking  Psychomotor Activity:  Psychomotor Retardation  Concentration:  Concentration: Fair and Attention Span: Fair  Recall:  14/09/2019 of Knowledge:  Fair  Language:  Fair  Akathisia:  Negative  Handed:  Right  AIMS (if indicated):     Assets:  Desire for Improvement Resilience  ADL's:  Intact  Cognition:  WNL  Sleep:  Number of Hours: 6.5     Treatment Plan Summary: Daily contact with patient to assess and evaluate symptoms and progress in treatment, Medication management and Plan : Patient is seen and examined.  Patient is a 34 year old female with the above-stated past psychiatric history is seen in follow-up.   Diagnosis: 1.  Major depression 2.  Alcohol use disorder. 3.  Cocaine use disorder. 4.  Cannabis use disorder 5.  Amphetamine use disorder 6.  Intrauterine pregnancy at approximately 28 weeks  Pertinent findings on examination today: 1.  Continued depression.  Plan: 1.  Continue Zoloft 75 mg p.o. daily for depression and  anxiety. 2.  Continue prenatal vitamins for pregnancy. 3.  Continue trazodone at 100 milligrams p.o. nightly for insomnia. 4.  Disposition planning-in progress.  20, MD 06/12/2020, 12:18 PM

## 2020-06-12 NOTE — Plan of Care (Addendum)
D: Pt alert and oriented x 4. Pt denies experiencing any depression/anxiety at this time. Pt reports experiencing lower back and hip pain r/t pregnancy. Pt denies experiencing any SI/HI, or AVH at this time.   Pt refused colace, miralax, and hydrocortisone. MD aware. Pt states she doesn't like the miralax/colace and doesn't need the hydrocortisone because she's not itching.  A: Scheduled medications administered to pt, per MD orders. Support and encouragement provided. Frequent verbal contact made. Routine safety checks conducted q15 minutes.   R: No adverse drug reactions noted. Pt verbally contracts for safety at this time. Pt complaint with medications and treatment plan. Pt interacts well with others on the unit. Pt remains safe at this time. Will continue to monitor.   Problem: Education: Goal: Knowledge of [facility] Education information/materials will improve Outcome: Not Progressing Goal: Emotional status will improve Outcome: Not Progressing Goal: Mental status will improve Outcome: Not Progressing Goal: Verbalization of understanding the information provided will improve Outcome: Not Progressing   Problem: Education: Goal: Knowledge of the prescribed therapeutic regimen will improve Outcome: Not Progressing   Problem: Safety: Goal: Ability to identify and utilize support systems that promote safety will improve Outcome: Not Progressing

## 2020-06-12 NOTE — Tx Team (Signed)
Interdisciplinary Treatment and Diagnostic Plan Update  06/12/2020 Time of Session: 10:05am  [name] MRN: [id number]  Principal Diagnosis: Severe recurrent major depression without psychotic features [facility])  Secondary Diagnoses: Principal Problem:   Severe recurrent major depression without psychotic features ([place]) Active Problems:   Stimulant use disorder (HCC) (cocaine)   Alcohol use disorder, moderate, dependence ([place])   Pregnant   Cocaine use disorder, moderate, dependence ([place])   History of preterm labor, current pregnancy, second trimester   Current Medications:  Current Facility-Administered Medications  Medication Dose Route Frequency Provider Last Rate Last Admin  . acetaminophen (TYLENOL) tablet 650 mg  650 mg Oral Q6H PRN Clapacs, Madie Reno, MD   650 mg at 06/11/20 2131  . alum & mag hydroxide-simeth (MAALOX/MYLANTA) 200-200-20 MG/5ML suspension 30 mL  30 mL Oral Q4H PRN Clapacs, John T, MD      . docusate sodium (COLACE) capsule 200 mg  200 mg Oral BID Clapacs, Madie Reno, MD   200 mg at 06/12/20 3825  . hydrocortisone cream 1 %   Topical BID Clapacs, John T, MD      . magnesium hydroxide (MILK OF MAGNESIA) suspension 30 mL  30 mL Oral Daily PRN Clapacs, Madie Reno, MD   30 mL at 06/05/20 0751  . polyethylene glycol (MIRALAX / GLYCOLAX) packet 17 g  17 g Oral Daily Sharma Covert, MD   17 g at 06/06/20 1146  . prenatal multivitamin tablet 1 tablet  1 tablet Oral Q1200 Sharma Covert, MD   1 tablet at 06/11/20 1307  . sertraline (ZOLOFT) tablet 75 mg  75 mg Oral Daily Clapacs, Madie Reno, MD   75 mg at 06/12/20 0808  . traZODone (DESYREL) tablet 100 mg  100 mg Oral QHS Clapacs, John T, MD   100 mg at 06/11/20 2131  . witch hazel-glycerin (TUCKS) pad   Topical PRN Clapacs, Madie Reno, MD   2 application at 05/39/76 1801   PTA Medications: Medications Prior to Admission  Medication Sig Dispense Refill Last Dose  . sertraline (ZOLOFT) 50 MG tablet Take 1 tablet (50 mg total)  by mouth daily. 30 tablet 0     Patient Stressors: Marital or family conflict Substance abuse  Patient Strengths: Capable of independent living Motivation for treatment/growth  Treatment Modalities: Medication Management, Group therapy, Case management,  1 to 1 session with clinician, Psychoeducation, Recreational therapy.   Physician Treatment Plan for Primary Diagnosis: Severe recurrent major depression without psychotic features ([place]) Long Term Goal(s): Improvement in symptoms so as ready for discharge Improvement in symptoms so as ready for discharge   Short Term Goals: Ability to verbalize feelings will improve Ability to disclose and discuss suicidal ideas Ability to demonstrate self-control will improve Ability to maintain clinical measurements within normal limits will improve Compliance with prescribed medications will improve  Medication Management: Evaluate patient's response, side effects, and tolerance of medication regimen.  Therapeutic Interventions: 1 to 1 sessions, Unit Group sessions and Medication administration.  Evaluation of Outcomes: Progressing  Physician Treatment Plan for Secondary Diagnosis: Principal Problem:   Severe recurrent major depression without psychotic features ([place]) Active Problems:   Stimulant use disorder (HCC) (cocaine)   Alcohol use disorder, moderate, dependence (HCC)   Pregnant   Cocaine use disorder, moderate, dependence (HCC)   History of preterm labor, current pregnancy, second trimester  Long Term Goal(s): Improvement in symptoms so as ready for discharge Improvement in symptoms so as ready for discharge   Short Term Goals: Ability  to verbalize feelings will improve Ability to disclose and discuss suicidal ideas Ability to demonstrate self-control will improve Ability to maintain clinical measurements within normal limits will improve Compliance with prescribed medications will improve     Medication Management: Evaluate  patient's response, side effects, and tolerance of medication regimen.  Therapeutic Interventions: 1 to 1 sessions, Unit Group sessions and Medication administration.  Evaluation of Outcomes: Progressing   RN Treatment Plan for Primary Diagnosis: Severe recurrent major depression without psychotic features (HCC) Long Term Goal(s): Knowledge of disease and therapeutic regimen to maintain health will improve  Short Term Goals: Ability to participate in decision making will improve, Ability to verbalize feelings will improve, Ability to disclose and discuss suicidal ideas, Ability to identify and develop effective coping behaviors will improve and Compliance with prescribed medications will improve  Medication Management: RN will administer medications as ordered by provider, will assess and evaluate patient's response and provide education to patient for prescribed medication. RN will report any adverse and/or side effects to prescribing provider.  Therapeutic Interventions: 1 on 1 counseling sessions, Psychoeducation, Medication administration, Evaluate responses to treatment, Monitor vital signs and CBGs as ordered, Perform/monitor CIWA, COWS, AIMS and Fall Risk screenings as ordered, Perform wound care treatments as ordered.  Evaluation of Outcomes: Progressing   LCSW Treatment Plan for Primary Diagnosis: Severe recurrent major depression without psychotic features (HCC) Long Term Goal(s): Safe transition to appropriate next level of care at discharge, Engage patient in therapeutic group addressing interpersonal concerns.  Short Term Goals: Engage patient in aftercare planning with referrals and resources and Increase skills for wellness and recovery  Therapeutic Interventions: Assess for all discharge needs, 1 to 1 time with Social worker, Explore available resources and support systems, Assess for adequacy in community support network, Educate family and significant other(s) on suicide  prevention, Complete Psychosocial Assessment, Interpersonal group therapy.  Evaluation of Outcomes: Progressing   Progress in Treatment: Attending groups: Yes. Participating in groups: Yes. Taking medication as prescribed: No. Toleration medication: No. Family/Significant other contact made: Yes, individual(s) contacted:  pt refused Patient understands diagnosis: Yes. Discussing patient identified problems/goals with staff: Yes. Medical problems stabilized or resolved: No. and As evidenced by:  hemorrhoids Denies suicidal/homicidal ideation: Yes. Issues/concerns per patient self-inventory: No. Other:   New problem(s) identified: No, Describe:  None  New Short Term/Long Term Goal(s): Medication stabilization, elimination of SI thoughts, and development of a comprehensive mental wellness plan.   Patient Goals:  "help with my depression"   Discharge Plan or Barriers: CSW will continue to follow up for appropriate referrals and possible discharge planning  Reason for Continuation of Hospitalization: Medical Issues Medication stabilization  Estimated Length of Stay: 2-4 days   Attendees: Patient:  06/12/2020   Physician: Dr. Landry Mellow, MD 06/12/2020   Nursing:  06/12/2020   RN Care Manager: 06/12/2020   Social Worker: Stephannie Peters, LCSW  06/12/2020   Recreational Therapist:  06/12/2020   Other:  06/12/2020   Other:  06/12/2020   Other: 06/12/2020     Scribe for Treatment Team: Delphia Grates, LCSW 06/12/2020 10:32 AM

## 2020-06-12 NOTE — [facility] Group Notes (Signed)
[facility] LCSW Group Therapy Note ° °Date/Time: 06/12/2020 @ 1:30PM ° °Type of Therapy and Topic:  Group Therapy:  Overcoming Obstacles ° °Participation Level:  [facility] PARTICIPATION LEVEL: Did Not Attend ° °Description of Group:   ° In this group patients will be encouraged to explore what they see as obstacles to their own wellness and recovery. They will be guided to discuss their thoughts, feelings, and behaviors related to these obstacles. The group will process together ways to cope with barriers, with attention given to specific choices patients can make. Each patient will be challenged to identify changes they are motivated to make in order to overcome their obstacles. This group will be process-oriented, with patients participating in exploration of their own experiences as well as giving and receiving support and challenge from other group members. ° °Therapeutic Goals: °1. Patient will identify personal and current obstacles as they relate to admission. °2. Patient will identify barriers that currently interfere with their wellness or overcoming obstacles.  °3. Patient will identify feelings, thought process and behaviors related to these barriers. °4. Patient will identify two changes they are willing to make to overcome these obstacles:  ° ° °Summary of Patient Progress ° ° °Unit did not have group due to going outside for rec time with tecs.  ° ° °Therapeutic Modalities:   °Cognitive Behavioral Therapy °Solution Focused Therapy °Motivational Interviewing °Relapse Prevention Therapy ° ° °Huda Petrey, LCSW °

## 2020-06-13 MED ORDER — DIPHENHYDRAMINE HCL 25 MG PO CAPS
50.0000 mg | ORAL_CAPSULE | Freq: Every evening | ORAL | Status: DC | PRN
  Administered 2020-06-13: 50 mg via ORAL
  Filled 2020-06-13: qty 2

## 2020-06-13 NOTE — Progress Notes (Signed)
[facility] MD Progress Note  06/13/2020 10:58 AM [name]  MRN:  [id number] Subjective:  Patient is a 34 year old female with a past psychiatric history significant for depression and substance abuse issues who was admitted on 06/04/2020 with suicidal ideation and thoughts of overdosing or cutting herself.  Objective: Patient is seen and examined.  Patient is a 34 year old female with the above-stated past psychiatric history who is seen in follow-up.  She is essentially unchanged from yesterday.  She is not really interested in talking a great deal.  She wants to know when she will be discharged.  I told her that social work continues to try and find her a place to stay.  Her Zoloft was just increased on 06/11/2020.  Vital signs are stable, she is afebrile.  She slept 7.15 hours last night.  No new laboratories.  Principal Problem: Severe recurrent major depression without psychotic features (HCC) Diagnosis: Principal Problem:   Severe recurrent major depression without psychotic features (HCC) Active Problems:   Stimulant use disorder (HCC) (cocaine)   Alcohol use disorder, moderate, dependence (HCC)   Pregnant   Cocaine use disorder, moderate, dependence (HCC)   History of preterm labor, current pregnancy, second trimester  Total Time spent with patient: 15 minutes  Past Psychiatric History: See admission H&P  Past Medical History:  Past Medical History:  Diagnosis Date  . Anxiety   . Depression   . History of substance abuse (HCC)   . History of suicide attempt   . History of thyroid disease   . Hypertension   . Thyroid disease     Past Surgical History:  Procedure Laterality Date  . CESAREAN SECTION  2000, 2002, 2013   Family History:  Family History  Problem Relation Age of Onset  . Diabetes Mother    Family Psychiatric  History: See admission H&P Social History:  Social History   Substance and Sexual Activity  Alcohol Use Yes   Comment: social drinker      Social History   Substance and Sexual Activity  Drug Use Not Currently  . Types: Cocaine, Marijuana   Comment: Positive UDS 12/2017    Social History   Socioeconomic History  . Marital status: Single    Spouse name: Not on file  . Number of children: Not on file  . Years of education: Not on file  . Highest education level: Not on file  Occupational History  . Not on file  Tobacco Use  . Smoking status: Current Every Day Smoker    Packs/day: 0.50  . Smokeless tobacco: Never Used  Substance and Sexual Activity  . Alcohol use: Yes    Comment: social drinker  . Drug use: Not Currently    Types: Cocaine, Marijuana    Comment: Positive UDS 12/2017  . Sexual activity: Not Currently    Birth control/protection: None  Other Topics Concern  . Not on file  Social History Narrative  . Not on file   Social Determinants of Health   Financial Resource Strain:   . Difficulty of Paying Living Expenses:   Food Insecurity:   . Worried About Programme researcher, broadcasting/film/video in the Last Year:   . Barista in the Last Year:   Transportation Needs:   . Freight forwarder (Medical):   Marland Kitchen Lack of Transportation (Non-Medical):   Physical Activity:   . Days of Exercise per Week:   . Minutes of Exercise per Session:   Stress:   . Feeling of  Stress :   Social Connections:   . Frequency of Communication with Friends and Family:   . Frequency of Social Gatherings with Friends and Family:   . Attends Religious Services:   . Active Member of Clubs or Organizations:   . Attends Banker Meetings:   Marland Kitchen Marital Status:    Additional Social History:                         Sleep: Good  Appetite:  Good  Current Medications: Current Facility-Administered Medications  Medication Dose Route Frequency Provider Last Rate Last Admin  . acetaminophen (TYLENOL) tablet 650 mg  650 mg Oral Q6H PRN Clapacs, Jackquline Denmark, MD   650 mg at 06/12/20 2132  . alum & mag hydroxide-simeth  (MAALOX/MYLANTA) 200-200-20 MG/5ML suspension 30 mL  30 mL Oral Q4H PRN Clapacs, John T, MD   30 mL at 06/12/20 1248  . docusate sodium (COLACE) capsule 200 mg  200 mg Oral BID Clapacs, Jackquline Denmark, MD   200 mg at 06/12/20 5053  . hydrocortisone cream 1 %   Topical BID Clapacs, John T, MD      . magnesium hydroxide (MILK OF MAGNESIA) suspension 30 mL  30 mL Oral Daily PRN Clapacs, Jackquline Denmark, MD   30 mL at 06/05/20 0751  . polyethylene glycol (MIRALAX / GLYCOLAX) packet 17 g  17 g Oral Daily Antonieta Pert, MD   17 g at 06/06/20 1146  . prenatal multivitamin tablet 1 tablet  1 tablet Oral Q1200 Antonieta Pert, MD   1 tablet at 06/12/20 1138  . sertraline (ZOLOFT) tablet 75 mg  75 mg Oral Daily Clapacs, Jackquline Denmark, MD   75 mg at 06/13/20 0801  . traZODone (DESYREL) tablet 100 mg  100 mg Oral QHS Clapacs, John T, MD   100 mg at 06/12/20 2131  . witch hazel-glycerin (TUCKS) pad   Topical PRN Clapacs, Jackquline Denmark, MD   2 application at 06/11/20 1801    Lab Results: No results found for this or any previous visit (from the past 48 hour(s)).  Blood Alcohol level:  Lab Results  Component Value Date   ETH 89 (H) 06/02/2020   ETH <10 05/11/2020    Metabolic Disorder Labs: Lab Results  Component Value Date   HGBA1C 5.5 06/05/2020   MPG 111.15 06/05/2020   MPG 99.67 01/02/2018   No results found for: PROLACTIN Lab Results  Component Value Date   CHOL 218 (H) 01/02/2018   TRIG 187 (H) 01/02/2018   HDL 81 01/02/2018   CHOLHDL 2.7 01/02/2018   VLDL 37 01/02/2018   LDLCALC 100 (H) 01/02/2018   LDLCALC 82 01/12/2016    Physical Findings: AIMS:  , ,  ,  ,    CIWA:    COWS:     Musculoskeletal: Strength & Muscle Tone: within normal limits Gait & Station: normal Patient leans: N/A  Psychiatric Specialty Exam: Physical Exam  Nursing note and vitals reviewed. Constitutional: She is oriented to person, place, and time.  HENT:  Head: Normocephalic and atraumatic.  Respiratory: Effort normal.   Neurological: She is oriented to person, place, and time.    Review of Systems  Blood pressure (!) 112/51, pulse 66, temperature 98.8 F (37.1 C), temperature source Oral, resp. rate 16, height 4\' 11"  (1.499 m), weight 72.1 kg, last menstrual period 12/04/2019, SpO2 100 %.Body mass index is 32.11 kg/m.  General Appearance: Disheveled  Eye Contact:  Fair  Speech:  Normal Rate  Volume:  Normal  Mood:  Irritable  Affect:  Congruent  Thought Process:  Coherent and Descriptions of Associations: Circumstantial  Orientation:  Full (Time, Place, and Person)  Thought Content:  Logical  Suicidal Thoughts:  No  Homicidal Thoughts:  No  Memory:  Immediate;   Fair Recent;   Fair Remote;   Fair  Judgement:  Intact  Insight:  Fair  Psychomotor Activity:  Normal  Concentration:  Concentration: Fair and Attention Span: Fair  Recall:  [employer] of Knowledge:  Fair  Language:  Fair  Akathisia:  Negative  Handed:  Right  AIMS (if indicated):     Assets:  Desire for Improvement Resilience  ADL's:  Intact  Cognition:  WNL  Sleep:  Number of Hours: 7.15     Treatment Plan Summary: Daily contact with patient to assess and evaluate symptoms and progress in treatment, Medication management and Plan : Patient is seen and examined.  Patient is a 34 year old female with the above-stated past psychiatric history who is seen in follow-up.  Diagnosis: 1. Major depression 2. Alcohol use disorder. 3. Cocaine use disorder. 4. Cannabis use disorder 5. Amphetamine use disorder 6. Intrauterine pregnancy at approximately 28 weeks  Pertinent findings on examination today: 1.  Continued depression.  Plan: 1.  Continue Zoloft 75 mg p.o. daily for depression and anxiety. 2.  Continue prenatal vitamins for pregnancy. 3.  Continue trazodone at 100 milligrams p.o. nightly for insomnia. 4.  Disposition planning-in progress. Sharma Covert, MD 06/13/2020, 10:58 AM

## 2020-06-13 NOTE — Progress Notes (Signed)
Patient pleasant during assessment denying SI/HI/AVH. Patient endorses pain related to her pregnancy, see MAR. Patient observed interacting appropriately with staff and peers on the unit. Patient given education, support and encouragement to be active in her treatment plan. Patient stated she is just waiting to find a place to stay. Patient being monitored Q 15 minutes for safety per unit protocol. Patient remains safe on the unit. 

## 2020-06-13 NOTE — [facility] Group Notes (Signed)
LCSW Group Therapy Note  Date/Time:  06/13/2020  1:00PM  Type of Therapy and Topic:  Group Therapy:  Healthy and Unhealthy Supports  Participation Level:  Active   Description of Group:  Patients in this group were introduced to the idea of adding a variety of healthy supports to address the various needs in their lives.Patients discussed what additional healthy supports could be helpful in their recovery and wellness after discharge in order to prevent future hospitalizations.   An emphasis was placed on using counselor, doctor, therapy groups, 12-step groups, and problem-specific support groups to expand supports.  They also worked as a group on developing a specific plan for several patients to deal with unhealthy supports through boundary-setting, psychoeducation with loved ones, and even termination of relationships.   Therapeutic Goals:   1)  discuss importance of adding supports to stay well once out of the hospital  2)  compare healthy versus unhealthy supports and identify some examples of each  3)  generate ideas and descriptions of healthy supports that can be added  4)  offer mutual support about how to address unhealthy supports  5)  encourage active participation in and adherence to discharge plan    Summary of Patient Progress:  The patient stated that current healthy supports in her life are her young children while current unhealthy supports include her sister.  The patient expressed a willingness to add other positive people as support(s) to help in her recovery journey.   Therapeutic Modalities:   Motivational Interviewing Brief Solution-Focused Therapy   Roselyn Bering, MSW, LCSW

## 2020-06-13 NOTE — Plan of Care (Signed)
  Problem: Education: Goal: Knowledge of [place] [facility] Education information/materials will improve Outcome: Progressing Goal: Emotional status will improve Outcome: Progressing Goal: Mental status will improve Outcome: Progressing Goal: Verbalization of understanding the information provided will improve Outcome: Progressing   

## 2020-06-13 NOTE — Progress Notes (Signed)
Patient complaining of hemorrhoid discomfort. Given Tucks pads per prn order. Denies SI, HI and AVH.

## 2020-06-13 NOTE — Plan of Care (Signed)
D- Patient alert and oriented. Patient presents in a pleasant mood on assessment stating that she didn't sleep good last night because she had "crazy dreams" and "woke up in a cold sweat, maybe I need to stop taking that sleeping pill". Patient denies SI, HI, AVH, and pain at this time. Patient also denies any signs/symptoms of depression/anxiety, reporting that overall, she is feeling "alright". Patient had no stated goals for today.  A- Some scheduled medications administered to patient, per MD orders. Support and encouragement provided.  Routine safety checks conducted every 15 minutes.  Patient informed to notify staff with problems or concerns.  R- No adverse drug reactions noted. Patient contracts for safety at this time. Patient compliant with medications and treatment plan. Patient receptive, calm, and cooperative. Patient interacts well with others on the unit.  Patient remains safe at this time.  Problem: Education: Goal: Knowledge of [place] [facility] Education information/materials will improve Outcome: Progressing Goal: Emotional status will improve Outcome: Progressing Goal: Mental status will improve Outcome: Progressing Goal: Verbalization of understanding the information provided will improve Outcome: Progressing   Problem: Safety: Goal: Periods of time without injury will increase Outcome: Progressing   Problem: Education: Goal: Knowledge of the prescribed therapeutic regimen will improve Outcome: Progressing   Problem: Safety: Goal: Ability to disclose and discuss suicidal ideas will improve Outcome: Progressing Goal: Ability to identify and utilize support systems that promote safety will improve Outcome: Progressing

## 2020-06-13 NOTE — Plan of Care (Signed)
Patient calm, pleasant and appropriate on the unit   Problem: Education: Goal: Emotional status will improve Outcome: Progressing Goal: Mental status will improve Outcome: Progressing

## 2020-06-14 ENCOUNTER — Encounter: Payer: Self-pay | Admitting: Obstetrics and Gynecology

## 2020-06-14 ENCOUNTER — Inpatient Hospital Stay
Admission: AD | Admit: 2020-06-14 | Discharge: 2020-06-15 | Disposition: A | Discharge status: Other Acute Inpatient Hospital | Payer: Medicaid Other | Attending: Obstetrics and Gynecology | Admitting: Obstetrics and Gynecology

## 2020-06-14 DIAGNOSIS — Z98891 History of uterine scar from previous surgery: Secondary | ICD-10-CM

## 2020-06-14 DIAGNOSIS — O98312 Other infections with a predominantly sexual mode of transmission complicating pregnancy, second trimester: Secondary | ICD-10-CM | POA: Insufficient documentation

## 2020-06-14 DIAGNOSIS — R45851 Suicidal ideations: Secondary | ICD-10-CM | POA: Insufficient documentation

## 2020-06-14 DIAGNOSIS — R197 Diarrhea, unspecified: Secondary | ICD-10-CM | POA: Insufficient documentation

## 2020-06-14 DIAGNOSIS — A599 Trichomoniasis, unspecified: Secondary | ICD-10-CM | POA: Insufficient documentation

## 2020-06-14 DIAGNOSIS — O26892 Other specified pregnancy related conditions, second trimester: Secondary | ICD-10-CM | POA: Diagnosis not present

## 2020-06-14 DIAGNOSIS — O26899 Other specified pregnancy related conditions, unspecified trimester: Secondary | ICD-10-CM | POA: Diagnosis not present

## 2020-06-14 DIAGNOSIS — F332 Major depressive disorder, recurrent severe without psychotic features: Secondary | ICD-10-CM | POA: Diagnosis present

## 2020-06-14 DIAGNOSIS — Z3A27 27 weeks gestation of pregnancy: Secondary | ICD-10-CM | POA: Insufficient documentation

## 2020-06-14 DIAGNOSIS — R109 Unspecified abdominal pain: Secondary | ICD-10-CM | POA: Insufficient documentation

## 2020-06-14 LAB — WET PREP, GENITAL
Sperm: NONE SEEN
Yeast Wet Prep HPF POC: NONE SEEN

## 2020-06-14 MED ORDER — LOPERAMIDE HCL 2 MG PO CAPS: 2.0000 mg | ORAL_CAPSULE | ORAL | Status: DC | PRN

## 2020-06-14 NOTE — Progress Notes (Signed)
Patient refused her evening medication, but did take her Prenatal vitamin from this afternoon. Patient did also state that she is not going to eat anymore, "I'm just going to stay in my room". This Clinical research associate asked her why she felt this way and she stated "because I'm tired of this food".

## 2020-06-14 NOTE — Progress Notes (Signed)
Patient complaining of abdominal pain 10/10, that feels like contractions that is unrelieved by tylenol, rest or drinking fluids, and has been getting progressively worse throughout the day.The pain started at around 5pm this afternoon and feels like "tightening and loosening". She has not been keeping up with how far apart they are. The pain is at the bottom of her abdomen and does not radiate. She denies any spotting and reports only a small amount of clear discharge. The patient denies SI, HI and AVH. She is able to contract for safety. She has been abusing drugs outside of the hospital and has not been getting any prenatal care, other than during previous admit to [facility]. She is [redacted] weeks pregnant. NP Elenore Paddy notified and [facility]. Patient will be discharged from [facility] and transferred to [facility] on labor and delivery for evaluation. Patient is a voluntary admission and does not need a sitter for safety. She is A&Ox4, calm, pleasant and cooperative. Normally voices no complaints not related to not being able to get specific foods she is craving. Has not been a behavior management problem on the unit.

## 2020-06-14 NOTE — Progress Notes (Signed)
[facility] MD Progress Note  06/14/2020 2:39 PM [name]  MRN:  [id number] Subjective: Follow-up for this 34 year old woman with chronic depression and substance abuse problems multiple social difficulties and pregnancy.  Patient tells me today she was having diarrhea.  Otherwise her mood is feeling more or less stable.  No suicidal thoughts no psychosis.  Mildly disappointed with our failure to find any kind of living situation for her. Principal Problem: Severe recurrent major depression without psychotic features (HCC) Diagnosis: Principal Problem:   Severe recurrent major depression without psychotic features (HCC) Active Problems:   Stimulant use disorder (HCC) (cocaine)   Alcohol use disorder, moderate, dependence (HCC)   Pregnant   Cocaine use disorder, moderate, dependence (HCC)   History of preterm labor, current pregnancy, second trimester  Total Time spent with patient: 30 minutes  Past Psychiatric History: Past history of mood problems often related to substance abuse issues  Past Medical History:  Past Medical History:  Diagnosis Date  . Anxiety   . Depression   . History of substance abuse (HCC)   . History of suicide attempt   . History of thyroid disease   . Hypertension   . Thyroid disease     Past Surgical History:  Procedure Laterality Date  . CESAREAN SECTION  2000, 2002, 2013   Family History:  Family History  Problem Relation Age of Onset  . Diabetes Mother    Family Psychiatric  History: See previous Social History:  Social History   Substance and Sexual Activity  Alcohol Use Yes   Comment: social drinker     Social History   Substance and Sexual Activity  Drug Use Not Currently  . Types: Cocaine, Marijuana   Comment: Positive UDS 12/2017    Social History   Socioeconomic History  . Marital status: Single    Spouse name: Not on file  . Number of children: Not on file  . Years of education: Not on file  . Highest education level: Not  on file  Occupational History  . Not on file  Tobacco Use  . Smoking status: Current Every Day Smoker    Packs/day: 0.50  . Smokeless tobacco: Never Used  Substance and Sexual Activity  . Alcohol use: Yes    Comment: social drinker  . Drug use: Not Currently    Types: Cocaine, Marijuana    Comment: Positive UDS 12/2017  . Sexual activity: Not Currently    Birth control/protection: None  Other Topics Concern  . Not on file  Social History Narrative  . Not on file   Social Determinants of Health   Financial Resource Strain:   . Difficulty of Paying Living Expenses:   Food Insecurity:   . Worried About Programme researcher, broadcasting/film/video in the Last Year:   . Barista in the Last Year:   Transportation Needs:   . Freight forwarder (Medical):   Marland Kitchen Lack of Transportation (Non-Medical):   Physical Activity:   . Days of Exercise per Week:   . Minutes of Exercise per Session:   Stress:   . Feeling of Stress :   Social Connections:   . Frequency of Communication with Friends and Family:   . Frequency of Social Gatherings with Friends and Family:   . Attends Religious Services:   . Active Member of Clubs or Organizations:   . Attends Banker Meetings:   Marland Kitchen Marital Status:    Additional Social History:  Sleep: Fair  Appetite:  Fair  Current Medications: Current Facility-Administered Medications  Medication Dose Route Frequency Provider Last Rate Last Admin  . acetaminophen (TYLENOL) tablet 650 mg  650 mg Oral Q6H PRN Starlee Corralejo, Madie Reno, MD   650 mg at 06/13/20 2112  . alum & mag hydroxide-simeth (MAALOX/MYLANTA) 200-200-20 MG/5ML suspension 30 mL  30 mL Oral Q4H PRN Korissa Horsford T, MD   30 mL at 06/12/20 1248  . diphenhydrAMINE (BENADRYL) capsule 50 mg  50 mg Oral QHS PRN Deloria Lair, NP   50 mg at 06/13/20 2337  . docusate sodium (COLACE) capsule 200 mg  200 mg Oral BID Rosanna Bickle T, MD   200 mg at 06/14/20 0747  .  hydrocortisone cream 1 %   Topical BID Karan Inclan T, MD      . loperamide (IMODIUM) capsule 2 mg  2 mg Oral PRN Dianara Smullen T, MD      . magnesium hydroxide (MILK OF MAGNESIA) suspension 30 mL  30 mL Oral Daily PRN Remmington Teters, Madie Reno, MD   30 mL at 06/05/20 0751  . polyethylene glycol (MIRALAX / GLYCOLAX) packet 17 g  17 g Oral Daily Sharma Covert, MD   17 g at 06/06/20 1146  . prenatal multivitamin tablet 1 tablet  1 tablet Oral Q1200 Sharma Covert, MD   1 tablet at 06/13/20 1156  . sertraline (ZOLOFT) tablet 75 mg  75 mg Oral Daily Shigeko Manard, Madie Reno, MD   75 mg at 06/14/20 0747  . traZODone (DESYREL) tablet 100 mg  100 mg Oral QHS Ezell Poke T, MD   100 mg at 06/12/20 2131  . witch hazel-glycerin (TUCKS) pad   Topical PRN Vanetta Rule, Madie Reno, MD   1 application at 16/10/96 2338    Lab Results: No results found for this or any previous visit (from the past 38 hour(s)).  Blood Alcohol level:  Lab Results  Component Value Date   ETH 89 (H) 06/02/2020   ETH <10 04/54/0981    Metabolic Disorder Labs: Lab Results  Component Value Date   HGBA1C 5.5 06/05/2020   MPG 111.15 06/05/2020   MPG 99.67 01/02/2018   No results found for: PROLACTIN Lab Results  Component Value Date   CHOL 218 (H) 01/02/2018   TRIG 187 (H) 01/02/2018   HDL 81 01/02/2018   CHOLHDL 2.7 01/02/2018   VLDL 37 01/02/2018   LDLCALC 100 (H) 01/02/2018   LDLCALC 82 01/12/2016    Physical Findings: AIMS:  , ,  ,  ,    CIWA:    COWS:     Musculoskeletal: Strength & Muscle Tone: within normal limits Gait & Station: normal Patient leans: N/A  Psychiatric Specialty Exam: Physical Exam  Constitutional: She appears well-developed.  HENT:  Head: Normocephalic and atraumatic.  Eyes: Pupils are equal, round, and reactive to light. Conjunctivae are normal.  Cardiovascular: Normal heart sounds.  Respiratory: Effort normal.  GI: Soft.  Musculoskeletal:        General: Normal range of motion.      Cervical back: Normal range of motion.  Neurological: She is alert.  Skin: Skin is warm and dry.  Psychiatric: Mood normal.    Review of Systems  Constitutional: Negative.   HENT: Negative.   Eyes: Negative.   Respiratory: Negative.   Cardiovascular: Negative.   Gastrointestinal: Positive for diarrhea.  Musculoskeletal: Negative.   Skin: Negative.   Neurological: Negative.   Psychiatric/Behavioral: Negative.     Blood pressure Marland Kitchen)  108/56, pulse 64, temperature 97.6 F (36.4 C), temperature source Oral, resp. rate 18, height 4\' 11"  (1.499 m), weight 72.1 kg, last menstrual period 12/04/2019, SpO2 100 %.Body mass index is 32.11 kg/m.  General Appearance: Casual  Eye Contact:  Fair  Speech:  Clear and Coherent  Volume:  Normal  Mood:  Euthymic  Affect:  Constricted  Thought Process:  Goal Directed  Orientation:  Full (Time, Place, and Person)  Thought Content:  Logical  Suicidal Thoughts:  No  Homicidal Thoughts:  No  Memory:  Immediate;   Fair Recent;   Fair Remote;   Fair  Judgement:  Fair  Insight:  Fair  Psychomotor Activity:  Normal  Concentration:  Concentration: Fair  Recall:  14/09/2019 of Knowledge:  Fair  Language:  Fair  Akathisia:  No  Handed:  Right  AIMS (if indicated):     Assets:  Desire for Improvement  ADL's:  Intact  Cognition:  WNL  Sleep:  Number of Hours: 7     Treatment Plan Summary: Daily contact with patient to assess and evaluate symptoms and progress in treatment, Medication management and Plan Patient is disappointed but he is not currently suicidal or psychotic and potentially does have a safe place to return to.  We will check up tomorrow to see whether anything has been found and if not there is a good chance will be looking at discharge with outpatient follow-up in the community for substance abuse and mental health issues.  No change to medicine today except add Imodium for diarrhea  [employer], MD 06/14/2020, 2:39 PM

## 2020-06-14 NOTE — OB Triage Note (Signed)
Recvd pt from [facility]. RN brought her up and stated she was here voluntarily and did not need a sitter. Pt states she started having abdominal pain around 1700 right after she ate dinner. No LOF or vaginal bleeding. Pt notes clear discharge but states it does not feel like her water is broken. Feeling baby move well. No thoughts of harming self. Here in [facility] for substance abuse help.

## 2020-06-14 NOTE — Progress Notes (Addendum)
Patient did eventually eat her lunch and dinner tray. Patient also reported to this writer that she does not feel like she needs to go to rehab for drugs because "drugs aren't my problem, I have a problem with depression. I can stop the drugs".

## 2020-06-14 NOTE — Progress Notes (Signed)
Patient refused her Prenatal vitamin this afternoon. Patient is upset about her lunch tray and is refusing to take her vitamin, at least for right now.

## 2020-06-14 NOTE — Progress Notes (Signed)
Recreation Therapy Notes  Date: 06/14/2020  Time: 9:30 am  Location: Craft room   Behavioral response: Appropriate  Intervention Topic: Goals  Discussion/Intervention:  Group content on today was focused on goals. Patients described what goals are and how they define goals. Individuals expressed how they go about setting goals and reaching them. The group identified how important goals are and if they make short term goals to reach long term goals. Patients described how many goals they work on at a time and what affects them not reaching their goal. Individuals described how much time they put into planning and obtaining their goals. The group participated in the intervention "My Goal Board" and made personal goal boards to help them achieve their goal. Clinical Observations/Feedback:  Patient came to group late due to unknown reasons. Participant was able to identify and share personal goals. Individual was social with peers and staff while participating on the intervention.  Belicia Difatta LRT/CTRS         Lorenda Grecco 06/14/2020 12:46 PM

## 2020-06-14 NOTE — BHH Group Notes (Signed)
Overcoming Obstacles  06/14/2020 9:30AM/1PM  Type of Therapy and Topic:  Group Therapy:  Overcoming Obstacles  Participation Level:  Did Not Attend    Description of Group:    In this group patients will be encouraged to explore what they see as obstacles to their own wellness and recovery. They will be guided to discuss their thoughts, feelings, and behaviors related to these obstacles. The group will process together ways to cope with barriers, with attention given to specific choices patients can make. Each patient will be challenged to identify changes they are motivated to make in order to overcome their obstacles. This group will be process-oriented, with patients participating in exploration of their own experiences as well as giving and receiving support and challenge from other group members.   Therapeutic Goals: 1. Patient will identify personal and current obstacles as they relate to admission. 2. Patient will identify barriers that currently interfere with their wellness or overcoming obstacles.  3. Patient will identify feelings, thought process and behaviors related to these barriers. 4. Patient will identify two changes they are willing to make to overcome these obstacles:      Summary of Patient Progress     Therapeutic Modalities:   Cognitive Behavioral Therapy Solution Focused Therapy Motivational Interviewing Relapse Prevention Therapy    Timeka Goette, MSW, LCSW 06/14/2020 2:19 PM  

## 2020-06-14 NOTE — Plan of Care (Signed)
D- Patient alert and oriented. Patient presents in a pleasant mood on assessment stating that she slept ok last night. Patient did not report any signs/symptoms of depression/anxiety to this Clinical research associate. Patient denies SI, HI, AVH, and pain at this time. Patient had no stated goals for today.  A- Some scheduled medications administered to patient, per MD orders. Support and encouragement provided.  Routine safety checks conducted every 15 minutes.  Patient informed to notify staff with problems or concerns.  R- No adverse drug reactions noted. Patient contracts for safety at this time. Patient compliant with medications and treatment plan. Patient receptive, calm, and cooperative. Patient interacts well with others on the unit.  Patient remains safe at this time.  Problem: Education: Goal: Knowledge of [facility] Education information/materials will improve Outcome: Progressing Goal: Emotional status will improve Outcome: Progressing Goal: Mental status will improve Outcome: Progressing Goal: Verbalization of understanding the information provided will improve Outcome: Progressing   Problem: Safety: Goal: Periods of time without injury will increase Outcome: Progressing   Problem: Education: Goal: Knowledge of the prescribed therapeutic regimen will improve Outcome: Progressing   Problem: Safety: Goal: Ability to disclose and discuss suicidal ideas will improve Outcome: Progressing Goal: Ability to identify and utilize support systems that promote safety will improve Outcome: Progressing

## 2020-06-14 NOTE — BHH Group Notes (Signed)
BHH Group Notes:  (Nursing/MHT/Case Management/Adjunct)  Date:  06/14/2020  Time:  10:51 AM  Type of Therapy:  COMMUNITY MEETING  Participation Level:  Did Not Attend   Summary of Progress/Problems:  Kerrie Pleasure 06/14/2020, 10:51 AM

## 2020-06-14 NOTE — BHH Counselor (Signed)
CSW faxed referral to [facility] at patient's request.  CSW received confirmation that fax ws successful. CSW will follow up.  Penni Homans, MSW, LCSW 06/14/2020 3:40 PM

## 2020-06-14 NOTE — Discharge Summary (Signed)
Patient ID: [name] Stacks MRN: [id number] DOB/AGE: Mar 28, 1986 34 y.o.  Admit date: 06/14/2020 Discharge date: 06/15/2020  Admission Diagnoses: Pt is currently admitted (on 06/02/20) for suicidal ideation.  Tonight she was sent to [facility] triage to evaluate her c/o cramping, she reports the cramping to be about every 8 min. Pt reports 3 episodes of diarrhea today. Denies vaginal itching, irritation, or odor.   Discharge Diagnoses: BV and Trichomonas infections  Prenatal Procedures: none  Consults: None  Significant Diagnostic Studies:  Results for orders placed or performed during the hospital encounter of 06/14/20 (from the past 168 hour(s))  Wet prep, genital   Collection Time: 06/14/20 11:11 PM  Result Value Ref Range   Yeast Wet Prep HPF POC NONE SEEN NONE SEEN   Trich, Wet Prep PRESENT (A) NONE SEEN   Clue Cells Wet Prep HPF POC PRESENT (A) NONE SEEN   WBC, Wet Prep HPF POC MANY (A) NONE SEEN   Sperm NONE SEEN     Treatments: antibiotics: metronidazole  Hospital Course:  This is a 33 y.o. [id number] with IUP at [redacted]w[redacted]d seen in [facility] triage for cramping that she reports feeling about every 8 mins. Speculum exam showed a closed cervix with copious amount of thin white discharge.  Wet prep showed both a BV and trichomonas infection.  Likely the cause of the cramping.  Discharge Physical Exam:  BP (!) 113/52 (BP Location: Left Arm)   Pulse 74   Temp 97.8 F (36.6 C)   Resp 18   LMP 12/04/2019 (Within Months)   General: NAD CV: RRR Pulm: nl effort ABD: s/nd/nt, gravid DVT Evaluation: LE non-ttp, no evidence of DVT on exam.  FHT: FHR: 145 bpm, variability: moderate,  accelerations:  Abscent,  decelerations:  Absent  -- Appropriate for GA TOCO: none   Recommendations 1) Cramping  Wet prep + for BV and trichomonas   Metronidazole 500 mg BID x 7 days  UA unable to obtain  Increase oral hydration and rest 2) Prenatal care  Prenatal labs including 1 hr GTT at 28 weeks  Consult  with MFM   Discharge Condition: Stable  Disposition: Transferred back to [facility]     Meds ordered this encounter  Medications  . metroNIDAZOLE (FLAGYL) tablet 500 mg      Signed:  Haroldine Laws, CNM 06/15/2020 1:08 AM

## 2020-06-15 ENCOUNTER — Inpatient Hospital Stay
Admission: AD | Admit: 2020-06-15 | Discharge: 2020-06-15 | DRG: 885 | Disposition: A | Discharge status: Home / Self Care | Payer: Medicaid Other | Source: Intra-hospital | Attending: Psychiatry | Admitting: Psychiatry

## 2020-06-15 ENCOUNTER — Other Ambulatory Visit: Payer: Self-pay

## 2020-06-15 ENCOUNTER — Encounter: Payer: Self-pay | Admitting: Psychiatry

## 2020-06-15 DIAGNOSIS — F419 Anxiety disorder, unspecified: Secondary | ICD-10-CM | POA: Diagnosis present

## 2020-06-15 DIAGNOSIS — Z833 Family history of diabetes mellitus: Secondary | ICD-10-CM | POA: Diagnosis not present

## 2020-06-15 DIAGNOSIS — F17210 Nicotine dependence, cigarettes, uncomplicated: Secondary | ICD-10-CM | POA: Diagnosis present

## 2020-06-15 DIAGNOSIS — R45851 Suicidal ideations: Secondary | ICD-10-CM | POA: Diagnosis present

## 2020-06-15 DIAGNOSIS — F1510 Other stimulant abuse, uncomplicated: Secondary | ICD-10-CM | POA: Diagnosis present

## 2020-06-15 DIAGNOSIS — Z886 Allergy status to analgesic agent status: Secondary | ICD-10-CM

## 2020-06-15 DIAGNOSIS — Z91018 Allergy to other foods: Secondary | ICD-10-CM | POA: Diagnosis not present

## 2020-06-15 DIAGNOSIS — Z91010 Allergy to peanuts: Secondary | ICD-10-CM

## 2020-06-15 DIAGNOSIS — F332 Major depressive disorder, recurrent severe without psychotic features: Principal | ICD-10-CM | POA: Diagnosis present

## 2020-06-15 DIAGNOSIS — Z331 Pregnant state, incidental: Secondary | ICD-10-CM | POA: Diagnosis present

## 2020-06-15 DIAGNOSIS — F1590 Other stimulant use, unspecified, uncomplicated: Secondary | ICD-10-CM | POA: Diagnosis present

## 2020-06-15 DIAGNOSIS — Z915 Personal history of self-harm: Secondary | ICD-10-CM | POA: Diagnosis not present

## 2020-06-15 DIAGNOSIS — O26892 Other specified pregnancy related conditions, second trimester: Secondary | ICD-10-CM | POA: Diagnosis not present

## 2020-06-15 MED ORDER — DOCUSATE SODIUM 100 MG PO CAPS
200.0000 mg | ORAL_CAPSULE | Freq: Two times a day (BID) | ORAL | Status: DC
  Administered 2020-06-15: 200 mg via ORAL
  Filled 2020-06-15: qty 2

## 2020-06-15 MED ORDER — POLYETHYLENE GLYCOL 3350 17 G PO PACK: 17.0000 g | PACK | Freq: Every day | ORAL | 1 refills | Status: DC

## 2020-06-15 MED ORDER — METRONIDAZOLE 500 MG PO TABS
500.0000 mg | ORAL_TABLET | Freq: Two times a day (BID) | ORAL | Status: DC
  Filled 2020-06-15 (×3): qty 1

## 2020-06-15 MED ORDER — SERTRALINE HCL 25 MG PO TABS: 75.0000 mg | ORAL_TABLET | Freq: Every day | ORAL | 1 refills | Status: DC

## 2020-06-15 MED ORDER — POLYETHYLENE GLYCOL 3350 17 G PO PACK
17.0000 g | PACK | Freq: Every day | ORAL | Status: DC
  Filled 2020-06-15: qty 1

## 2020-06-15 MED ORDER — WITCH HAZEL-GLYCERIN EX PADS
MEDICATED_PAD | CUTANEOUS | Status: DC | PRN
  Filled 2020-06-15: qty 100

## 2020-06-15 MED ORDER — DOCUSATE SODIUM 100 MG PO CAPS: 200.0000 mg | ORAL_CAPSULE | Freq: Two times a day (BID) | ORAL | 1 refills | Status: DC

## 2020-06-15 MED ORDER — DIPHENHYDRAMINE HCL 50 MG PO CAPS: 50.0000 mg | ORAL_CAPSULE | Freq: Every evening | ORAL | 1 refills | Status: DC | PRN

## 2020-06-15 MED ORDER — ACETAMINOPHEN 325 MG PO TABS
650.0000 mg | ORAL_TABLET | Freq: Once | ORAL | Status: AC
  Administered 2020-06-15: 650 mg via ORAL
  Filled 2020-06-15: qty 2

## 2020-06-15 MED ORDER — SERTRALINE HCL 25 MG PO TABS
75.0000 mg | ORAL_TABLET | Freq: Every day | ORAL | Status: DC
  Administered 2020-06-15: 75 mg via ORAL
  Filled 2020-06-15: qty 3

## 2020-06-15 MED ORDER — DIPHENHYDRAMINE HCL 25 MG PO CAPS
50.0000 mg | ORAL_CAPSULE | Freq: Every evening | ORAL | Status: DC | PRN
  Administered 2020-06-15: 50 mg via ORAL
  Filled 2020-06-15: qty 2

## 2020-06-15 MED ORDER — ACETAMINOPHEN 325 MG PO TABS: 650.0000 mg | ORAL_TABLET | Freq: Four times a day (QID) | ORAL | Status: DC | PRN

## 2020-06-15 MED ORDER — HYDROCORTISONE 1 % EX CREA: TOPICAL_CREAM | Freq: Two times a day (BID) | CUTANEOUS | 1 refills | Status: DC

## 2020-06-15 MED ORDER — METRONIDAZOLE 500 MG PO TABS: 500.0000 mg | ORAL_TABLET | Freq: Two times a day (BID) | ORAL | 0 refills | Status: DC

## 2020-06-15 MED ORDER — ACETAMINOPHEN 325 MG PO TABS: 650.0000 mg | ORAL_TABLET | Freq: Once | ORAL | Status: DC

## 2020-06-15 MED ORDER — ALUM & MAG HYDROXIDE-SIMETH 200-200-20 MG/5ML PO SUSP: 30.0000 mL | ORAL | Status: DC | PRN

## 2020-06-15 MED ORDER — METRONIDAZOLE 500 MG PO TABS
500.0000 mg | ORAL_TABLET | Freq: Two times a day (BID) | ORAL | Status: DC
  Administered 2020-06-15: 500 mg via ORAL
  Filled 2020-06-15: qty 1

## 2020-06-15 MED ORDER — HYDROCORTISONE 1 % EX CREA
TOPICAL_CREAM | Freq: Two times a day (BID) | CUTANEOUS | Status: DC
  Filled 2020-06-15: qty 28

## 2020-06-15 MED ORDER — MAGNESIUM HYDROXIDE 400 MG/5ML PO SUSP: 30.0000 mL | Freq: Every day | ORAL | Status: DC | PRN

## 2020-06-15 NOTE — Plan of Care (Signed)
Patient affect is improving   Problem: Education: Goal: Emotional status will improve Outcome: Progressing Goal: Mental status will improve Outcome: Progressing

## 2020-06-15 NOTE — Progress Notes (Signed)
Discharge Note:  Pt discharged to home via [facility], was given a cab voucher, via instructions from pt's social worker. Pt was escorted to the cab by staff when it arrived. Upon discharge pt is alert and oriented to person, place, time and situation, is calm, cooperative, and pleasant, denies suicidal and homicidal ideation, denies hallucinations, denies feelings of depression and anxiety. Pt was given her discharge instructions, prescription medication, verbalized understanding of all. No distress noted, none reported, voiced no complaints. All personal belongings returned to pt upon discharge.

## 2020-06-15 NOTE — Progress Notes (Signed)
Meal tray given to pt. Discharge instructions given to pt. Instructed to take antibiotics to clear up the two infections. Will admit pt back to [facility]. Pt verbalized understanding and has no questions.

## 2020-06-15 NOTE — Progress Notes (Signed)
Patient pleasant during assessment denying SI/HI/AVH. Patient endorses pain related to her pregnancy, see MAR. Patient observed interacting appropriately with staff and peers on the unit. Patient given education, support and encouragement to be active in her treatment plan. Patient stated she is just waiting to find a place to stay. Patient being monitored Q 15 minutes for safety per unit protocol. Patient remains safe on the unit.

## 2020-06-15 NOTE — Progress Notes (Signed)
  [facility] Adult Case Management Discharge Plan :  Will you be returning to the same living situation after discharge:  Yes,  pt reports that she is returning to her home.  At discharge, do you have transportation home?: Yes,  CSW will assist with transportation.  Do you have the ability to pay for your medications: Yes,  Medicaid  Release of information consent forms completed and in the chart;  Patient's signature needed at discharge.  Patient to Follow up at:  Follow-up Information    [facility] Follow up.   Why: Please follow up with [facility] at your earliest convenience.  Thanks! Contact information: [address] [place] [address] [phone]               Next level of care provider has access to [facility] Link:no  Safety Planning and Suicide Prevention discussed: No.  SPE completed with the patient.      Has patient been referred to the Quitline?: Patient refused referral  Patient has been referred for addiction treatment: Yes  [name], LCSW 06/15/2020, 4:20 PM

## 2020-06-15 NOTE — BHH Suicide Risk Assessment (Signed)
[facility] Discharge Suicide Risk Assessment   Principal Problem: MDD (major depressive disorder), recurrent episode, severe (HCC) Discharge Diagnoses: Principal Problem:   MDD (major depressive disorder), recurrent episode, severe (HCC) Active Problems:   Stimulant use disorder (HCC) (cocaine)   Total Time spent with patient: 30 minutes  Musculoskeletal: Strength & Muscle Tone: within normal limits Gait & Station: normal Patient leans: N/A  Psychiatric Specialty Exam: Review of Systems  Constitutional: Negative.   HENT: Negative.   Eyes: Negative.   Respiratory: Negative.   Cardiovascular: Negative.   Gastrointestinal: Negative.   Musculoskeletal: Negative.   Skin: Negative.   Neurological: Negative.   Psychiatric/Behavioral: Negative.     Height 4\' 11"  (1.499 m), weight 72.1 kg, last menstrual period 12/04/2019.Body mass index is 32.1 kg/m.  General Appearance: Casual  Eye Contact::  Good  Speech:  Clear and Coherent409  Volume:  Normal  Mood:  Euthymic  Affect:  Congruent  Thought Process:  Goal Directed  Orientation:  Full (Time, Place, and Person)  Thought Content:  Logical  Suicidal Thoughts:  No  Homicidal Thoughts:  No  Memory:  Immediate;   Fair Recent;   Fair Remote;   Fair  Judgement:  Fair  Insight:  Fair  Psychomotor Activity:  Normal  Concentration:  Fair  Recall:  [phone] of Knowledge:Fair  Language: Fair  Akathisia:  No  Handed:  Right  AIMS (if indicated):     Assets:  Desire for Improvement Housing Physical Health Resilience  Sleep:  Number of Hours: 3  Cognition: WNL  ADL's:  Intact   Mental Status Per Nursing Assessment::   On Admission:  NA  Demographic Factors:  Living alone  Loss Factors: Loss of significant relationship  Historical Factors: Impulsivity  Risk Reduction Factors:   Positive therapeutic relationship  Continued Clinical Symptoms:  Depression:   Comorbid alcohol abuse/dependence Alcohol/Substance  Abuse/Dependencies  Cognitive Features That Contribute To Risk:  None    Suicide Risk:  Minimal: No identifiable suicidal ideation.  Patients presenting with no risk factors but with morbid ruminations; may be classified as minimal risk based on the severity of the depressive symptoms   Follow-up Information    [facility] Follow up.   Why: Please follow up with [facility] at your earliest convenience.  Thanks! Contact information: [address] [place] [phone]               Plan Of Care/Follow-up recommendations:  Activity:  Activity as tolerated Diet:  Regular diet Other:  Follow-up with outpatient treatment at [facility]  [facility], MD 06/15/2020, 2:22 PM

## 2020-06-15 NOTE — BHH Counselor (Signed)
CSW spoke with Rosie Fate regarding the patient's referral.Patient has been scheduled for a phone interview at 11:30AM.  CSW will receive a follow up phone call regarding the interview.  CSW attempted to contact [place] with [facility], however, CSW had to leave a HIPAA compliant voicemail.  Penni Homans, MSW, LCSW 06/15/2020 10:22 AM

## 2020-06-15 NOTE — BHH Group Notes (Signed)
LCSW Group Therapy Note  06/15/2020 4:02 PM  Type of Therapy/Topic:  Group Therapy:  Feelings about Diagnosis  Participation Level:  Active   Description of Group:   This group will allow patients to explore their thoughts and feelings about diagnoses they have received. Patients will be guided to explore their level of understanding and acceptance of these diagnoses. Facilitator will encourage patients to process their thoughts and feelings about the reactions of others to their diagnosis and will guide patients in identifying ways to discuss their diagnosis with significant others in their lives. This group will be process-oriented, with patients participating in exploration of their own experiences, giving and receiving support, and processing challenge from other group members.   Therapeutic Goals: 1. Patient will demonstrate understanding of diagnosis as evidenced by identifying two or more symptoms of the disorder 2. Patient will be able to express two feelings regarding the diagnosis 3. Patient will demonstrate their ability to communicate their needs through discussion and/or role play  Summary of Patient Progress: Patient arrived for group.  CSW reviewed discharge planning with the patient as she requested to do that over discussion on her diagnosis.  Patient was receptive and in a good mood.  Therapeutic Modalities:   Cognitive Behavioral Therapy Brief Therapy Feelings Identification   Penni Homans, MSW, LCSW 06/15/2020 4:02 PM

## 2020-06-15 NOTE — Progress Notes (Signed)
Recreation Therapy Notes  Date: 06/15/2020  Time: 9:30 am   Location: Craft room    Behavioral response: N/A   Intervention Topic: Strengths    Discussion/Intervention: Patient did not attend group.   Clinical Observations/Feedback:  Patient did not attend group.   [name] LRT/CTRS         [name] 06/15/2020 11:32 AM

## 2020-06-15 NOTE — BHH Counselor (Signed)
CSW spoke with Rosie Fate [phone] regarding the patient referral. Lurena Joiner reports she staffed the referral with the treatment team and they have concerns about the patient history of suicidal ideation. She states concerns were raised about the patient living independently in one of their two story townhomes and suggest she have more supervision. Lurena Joiner suggest referring the patient to [facility](ph:[phone], fax:[phone]) where she can receive 24 hr supervision or [facility].

## 2020-06-15 NOTE — Discharge Summary (Signed)
Physician Discharge Summary Note  Patient:  [name] is an 34 y.o., female MRN:  [id number] DOB:  September 02, 1986 Patient phone:  [phone] (home)  Patient address:   [address] [place] [address],  Total Time spent with patient: 30 minutes  Date of Admission:  06/15/2020 Date of Discharge: 06/15/2020  Reason for Admission: Patient was admitted to the hospital after presenting with severe depression and suicidal ideations substance abuse pregnancy  Principal Problem: MDD (major depressive disorder), recurrent episode, severe ([place]) Discharge Diagnoses: Principal Problem:   MDD (major depressive disorder), recurrent episode, severe ([place]) Active Problems:   Stimulant use disorder ([place]) (cocaine)   Past Psychiatric History: Multiple prior presentations chronic behavior problems heavy substance abuse multiple social stresses  Past Medical History:  Past Medical History:  Diagnosis Date  . Anxiety   . Depression   . History of substance abuse ([place])   . History of suicide attempt   . History of thyroid disease   . Hypertension   . Thyroid disease     Past Surgical History:  Procedure Laterality Date  . CESAREAN SECTION  2000, 2002, 2013   Family History:  Family History  Problem Relation Age of Onset  . Diabetes Mother    Family Psychiatric  History: See previous Social History:  Social History   Substance and Sexual Activity  Alcohol Use Yes   Comment: social drinker     Social History   Substance and Sexual Activity  Drug Use Yes  . Types: Cocaine, Marijuana   Comment: Positive UDS 12/2017    Social History   Socioeconomic History  . Marital status: Single    Spouse name: Not on file  . Number of children: Not on file  . Years of education: Not on file  . Highest education level: Not on file  Occupational History  . Not on file  Tobacco Use  . Smoking status: Current Every Day Smoker    Packs/day: 0.50  . Smokeless tobacco: Never  Used  Substance and Sexual Activity  . Alcohol use: Yes    Comment: social drinker  . Drug use: Yes    Types: Cocaine, Marijuana    Comment: Positive UDS 12/2017  . Sexual activity: Not Currently    Birth control/protection: None  Other Topics Concern  . Not on file  Social History Narrative  . Not on file   Social Determinants of Health   Financial Resource Strain:   . Difficulty of Paying Living Expenses:   Food Insecurity:   . Worried About Charity fundraiser in the Last Year:   . Arboriculturist in the Last Year:   Transportation Needs:   . Film/video editor (Medical):   Marland Kitchen Lack of Transportation (Non-Medical):   Physical Activity:   . Days of Exercise per Week:   . Minutes of Exercise per Session:   Stress:   . Feeling of Stress :   Social Connections:   . Frequency of Communication with Friends and Family:   . Frequency of Social Gatherings with Friends and Family:   . Attends Religious Services:   . Active Member of Clubs or Organizations:   . Attends Archivist Meetings:   Marland Kitchen Marital Status:     Hospital Course: Maintained on 15-minute checks.  Engaged in individual and group therapy.  Patient showed no dangerous behavior in the hospital.  Depression was treated with sertraline which was increased eventually to 75 mg a day.  We made multiple efforts to try to find places where the patient could stay for follow-up treatment in a safe environment for the at least the remainder of her pregnancy without any success.  Patient now says she feels okay going home.  She feels like she will be able to maintain sobriety and promises to follow-up with [facility].  Physical Findings: AIMS:  , ,  ,  ,    CIWA:    COWS:     Musculoskeletal: Strength & Muscle Tone: within normal limits Gait & Station: normal Patient leans: N/A  Psychiatric Specialty Exam: Physical Exam  Nursing note and vitals reviewed. Constitutional: She appears well-developed.  HENT:  Head:  Normocephalic and atraumatic.  Eyes: Pupils are equal, round, and reactive to light. Conjunctivae are normal.  Cardiovascular: Normal heart sounds.  Respiratory: Effort normal.  GI: Soft.  Musculoskeletal:        General: Normal range of motion.     Cervical back: Normal range of motion.  Neurological: She is alert.  Skin: Skin is warm and dry.  Psychiatric: Mood normal.    Review of Systems  Constitutional: Negative.   HENT: Negative.   Eyes: Negative.   Respiratory: Negative.   Cardiovascular: Negative.   Gastrointestinal: Negative.   Musculoskeletal: Negative.   Skin: Negative.   Neurological: Negative.   Psychiatric/Behavioral: Negative.     Height 4\' 11"  (1.499 m), weight 72.1 kg, last menstrual period 12/04/2019.Body mass index is 32.1 kg/m.  General Appearance: Casual  Eye Contact:  Good  Speech:  Clear and Coherent  Volume:  Normal  Mood:  Euthymic  Affect:  Constricted  Thought Process:  Goal Directed  Orientation:  Full (Time, Place, and Person)  Thought Content:  Logical  Suicidal Thoughts:  No  Homicidal Thoughts:  No  Memory:  Immediate;   Fair Recent;   Fair Remote;   Fair  Judgement:  Fair  Insight:  Fair  Psychomotor Activity:  Normal  Concentration:  Concentration: Fair  Recall:  Fair  Fund of Knowledge:  Fair  Language:  Fair  Akathisia:  No  Handed:  Right  AIMS (if indicated):     Assets:  Desire for Improvement Housing Physical Health Resilience Social Support  ADL's:  Intact  Cognition:  WNL  Sleep:  Number of Hours: 3        Has this patient used any form of tobacco in the last 30 days? (Cigarettes, Smokeless Tobacco, Cigars, and/or Pipes) Yes, No  Blood Alcohol level:  Lab Results  Component Value Date   ETH 89 (H) 06/02/2020   ETH <10 05/11/2020    Metabolic Disorder Labs:  Lab Results  Component Value Date   HGBA1C 5.5 06/05/2020   MPG 111.15 06/05/2020   MPG 99.67 01/02/2018   No results found for: PROLACTIN Lab  Results  Component Value Date   CHOL 218 (H) 01/02/2018   TRIG 187 (H) 01/02/2018   HDL 81 01/02/2018   CHOLHDL 2.7 01/02/2018   VLDL 37 01/02/2018   LDLCALC 100 (H) 01/02/2018   LDLCALC 82 01/12/2016    See Psychiatric Specialty Exam and Suicide Risk Assessment completed by Attending Physician prior to discharge.  Discharge destination:  Home  Is patient on multiple antipsychotic therapies at discharge:  No   Has Patient had three or more failed trials of antipsychotic monotherapy by history:  No  Recommended Plan for Multiple Antipsychotic Therapies:   Discharge Instructions    Diet - low sodium heart healthy  Complete by: As directed    Increase activity slowly   Complete by: As directed      Allergies as of 06/15/2020      Reactions   Aspirin Anaphylaxis, Diarrhea, Nausea And Vomiting   Banana Anaphylaxis, Itching, Shortness Of Breath, Swelling   Other Itching, Shortness Of Breath, Swelling   Peanut Oil Itching, Shortness Of Breath, Swelling   Peanut-containing Drug Products Shortness Of Breath, Itching, Swelling   Pecan Nut (diagnostic) Anaphylaxis      Medication List    STOP taking these medications   prenatal multivitamin Tabs tablet     TAKE these medications     Indication  diphenhydrAMINE 50 MG capsule Commonly known as: BENADRYL Take 1 capsule (50 mg total) by mouth at bedtime as needed (PRN for sleep per MD orders).  Indication: Itching   docusate sodium 100 MG capsule Commonly known as: COLACE Take 2 capsules (200 mg total) by mouth 2 (two) times daily.  Indication: Constipation   hydrocortisone cream 1 % Apply topically 2 (two) times daily. To affected area  Indication: Skin Inflammation   metroNIDAZOLE 500 MG tablet Commonly known as: FLAGYL Take 1 tablet (500 mg total) by mouth every 12 (twelve) hours.  Indication: Vaginosis caused by Bacteria   polyethylene glycol 17 g packet Commonly known as: MIRALAX / GLYCOLAX Take 17 g by mouth  daily. Start taking on: June 16, 2020  Indication: Constipation   sertraline 25 MG tablet Commonly known as: ZOLOFT Take 3 tablets (75 mg total) by mouth daily. Start taking on: June 16, 2020 What changed:   medication strength  how much to take  Indication: Major Depressive Disorder       Follow-up Information    [facility] Follow up.   Why: Please follow up with [facility] at your earliest convenience.  Thanks! Contact information: [address] [place] [address] [phone]               Follow-up recommendations:  Activity:  Activity as tolerated Diet:  Regular diet Other:  Follow-up with [facility] and also with outpatient obstetric care  Comments: Prescriptions provided at discharge  Signed: Mordecai Rasmussen, MD 06/15/2020, 2:25 PM

## 2020-06-15 NOTE — Progress Notes (Signed)
   06/15/20 1400  Clinical Encounter Type  Visited With Patient  Visit Type Follow-up;Spiritual support;Social support;Behavioral Health  Referral From Chaplain  Consult/Referral To Chaplain  Ch visited Pt in BH. Pt attended Ch group. Today we talked about forgiveness. Pt participated in the discussion. Ch will follow-up with Pt.   

## 2020-06-16 NOTE — Discharge Summary (Signed)
Physician Discharge Summary Note  Patient:  [name] is an 34 y.o., female MRN:  [id number] DOB:  11/21/1986 Patient phone:  [phone] (home)  Patient address:   [address] [place] [address],  Total Time spent with patient: 20 minutes  Date of Admission:  06/03/2020 Date of Discharge: 06/14/2020  Reason for Admission: Patient had originally been admitted because of major depression with passive suicidal ideation and homelessness and substance abuse  Principal Problem: Severe recurrent major depression without psychotic features ([place]) Discharge Diagnoses: Principal Problem:   Severe recurrent major depression without psychotic features ([place]) Active Problems:   Stimulant use disorder ([place]) (cocaine)   Alcohol use disorder, moderate, dependence ([place])   Pregnant   Cocaine use disorder, moderate, dependence ([place])   History of preterm labor, current pregnancy, second trimester   Past Psychiatric History: Past history of substance abuse and depression  Past Medical History:  Past Medical History:  Diagnosis Date  . Anxiety   . Depression   . History of substance abuse ([place])   . History of suicide attempt   . History of thyroid disease   . Hypertension   . Thyroid disease     Past Surgical History:  Procedure Laterality Date  . CESAREAN SECTION  2000, 2002, 2013   Family History:  Family History  Problem Relation Age of Onset  . Diabetes Mother    Family Psychiatric  History: See previous Social History:  Social History   Substance and Sexual Activity  Alcohol Use Yes   Comment: social drinker     Social History   Substance and Sexual Activity  Drug Use Yes  . Types: Cocaine, Marijuana   Comment: Positive UDS 12/2017    Social History   Socioeconomic History  . Marital status: Single    Spouse name: Not on file  . Number of children: Not on file  . Years of education: Not on file  . Highest education level: Not on file   Occupational History  . Not on file  Tobacco Use  . Smoking status: Current Every Day Smoker    Packs/day: 0.50  . Smokeless tobacco: Never Used  Substance and Sexual Activity  . Alcohol use: Yes    Comment: social drinker  . Drug use: Yes    Types: Cocaine, Marijuana    Comment: Positive UDS 12/2017  . Sexual activity: Not Currently    Birth control/protection: None  Other Topics Concern  . Not on file  Social History Narrative  . Not on file   Social Determinants of Health   Financial Resource Strain:   . Difficulty of Paying Living Expenses:   Food Insecurity:   . Worried About Charity fundraiser in the Last Year:   . Arboriculturist in the Last Year:   Transportation Needs:   . Film/video editor (Medical):   Marland Kitchen Lack of Transportation (Non-Medical):   Physical Activity:   . Days of Exercise per Week:   . Minutes of Exercise per Session:   Stress:   . Feeling of Stress :   Social Connections:   . Frequency of Communication with Friends and Family:   . Frequency of Social Gatherings with Friends and Family:   . Attends Religious Services:   . Active Member of Clubs or Organizations:   . Attends Archivist Meetings:   Marland Kitchen Marital Status:     Hospital Course: Patient had been stabilized and treated for depression on the  psychiatric unit.  See old notes leading up to this.  Patient was referred to multiple possible inpatient substance abuse treatment options without success.  Plan is for discharge tomorrow.  This particular disposition however is because she was transferred to the [facility] unit briefly.  Physical Findings: AIMS:  , ,  ,  ,    CIWA:    COWS:     Musculoskeletal: Strength & Muscle Tone: within normal limits Gait & Station: normal Patient leans: N/A  Psychiatric Specialty Exam: Physical Exam  Nursing note reviewed. Constitutional: She appears well-developed.  HENT:  Head: Normocephalic and atraumatic.  Eyes: Pupils are equal, round, and  reactive to light. Conjunctivae are normal.  Cardiovascular: Normal heart sounds.  Respiratory: Effort normal.  GI: Soft.  Musculoskeletal:        General: Normal range of motion.     Cervical back: Normal range of motion.  Neurological: She is alert.  Skin: Skin is warm and dry.  Psychiatric: Mood normal.    Review of Systems  Constitutional: Negative.   HENT: Negative.   Eyes: Negative.   Respiratory: Negative.   Cardiovascular: Negative.   Gastrointestinal: Negative.   Musculoskeletal: Negative.   Skin: Negative.   Neurological: Negative.   Psychiatric/Behavioral: Negative.     Blood pressure (!) 108/56, pulse 64, temperature 97.6 F (36.4 C), temperature source Oral, resp. rate 18, height 4\' 11"  (1.499 m), weight 72.1 kg, last menstrual period 12/04/2019, SpO2 100 %.Body mass index is 32.11 kg/m.  General Appearance: Casual  Eye Contact:  Good  Speech:  Clear and Coherent  Volume:  Normal  Mood:  Euthymic  Affect:  Congruent  Thought Process:  Goal Directed  Orientation:  Full (Time, Place, and Person)  Thought Content:  Logical  Suicidal Thoughts:  No  Homicidal Thoughts:  No  Memory:  Immediate;   Fair  Judgement:  Fair  Insight:  Fair  Psychomotor Activity:  Normal  Concentration:  Concentration: Fair  Recall:  Fair  Fund of Knowledge:  Fair  Language:  Fair  Akathisia:  No  Handed:  Right  AIMS (if indicated):     Assets:  Desire for Improvement  ADL's:  Intact  Cognition:  WNL  Sleep:  Number of Hours: 7     Have you used any form of tobacco in the last 30 days? (Cigarettes, Smokeless Tobacco, Cigars, and/or Pipes): No  Has this patient used any form of tobacco in the last 30 days? (Cigarettes, Smokeless Tobacco, Cigars, and/or Pipes) Yes, No  Blood Alcohol level:  Lab Results  Component Value Date   ETH 89 (H) 06/02/2020   ETH <10 05/11/2020    Metabolic Disorder Labs:  Lab Results  Component Value Date   HGBA1C 5.5 06/05/2020   MPG  111.15 06/05/2020   MPG 99.67 01/02/2018   No results found for: PROLACTIN Lab Results  Component Value Date   CHOL 218 (H) 01/02/2018   TRIG 187 (H) 01/02/2018   HDL 81 01/02/2018   CHOLHDL 2.7 01/02/2018   VLDL 37 01/02/2018   LDLCALC 100 (H) 01/02/2018   LDLCALC 82 01/12/2016    See Psychiatric Specialty Exam and Suicide Risk Assessment completed by Attending Physician prior to discharge.  Discharge destination:  Other:  Patient is being transferred to the upstairs OB unit for examination for complaints of discomfort  Is patient on multiple antipsychotic therapies at discharge:  No   Has Patient had three or more failed trials of antipsychotic monotherapy by history:  No  Recommended Plan for Multiple Antipsychotic Therapies: NA   Allergies as of 06/14/2020      Reactions   Aspirin Anaphylaxis, Diarrhea, Nausea And Vomiting   Banana Anaphylaxis, Itching, Shortness Of Breath, Swelling   Other Itching, Shortness Of Breath, Swelling   Peanut Oil Itching, Shortness Of Breath, Swelling   Peanut-containing Drug Products Shortness Of Breath, Itching, Swelling   Pecan Nut (diagnostic) Anaphylaxis      Medication List    You have not been prescribed any medications.     Follow-up Information    [facility] Follow up.   Specialty: Addiction Medicine Why: Referral sent 6/15 Contact information: [address] [place] [address] [phone]               Follow-up recommendations:  Activity:  Activity according to OB unit Diet:  No diet will be necessary Other:  Transfer back to psychiatric unit when done  Comments: Patient not actually seen for this examination this is being done to fulfill the computers need for a discharge note  Signed: Mordecai Rasmussen, MD 06/16/2020, 3:08 PM

## 2020-06-16 NOTE — H&P (Signed)
Psychiatric Admission Assessment Adult  Patient Identification: [name] MRN:  [id number] Date of Evaluation:  06/15/2020 Chief Complaint:  MDD Principal Diagnosis: MDD (major depressive disorder), recurrent episode, severe ([place]) Diagnosis:  Principal Problem:   MDD (major depressive disorder), recurrent episode, severe ([place]) Active Problems:   Stimulant use disorder ([place]) (cocaine)  History of Present Illness: 34 year old woman with substance abuse depression and pregnancy.  Patient had been transferred upstairs to the obstetrics unit for evaluation during the night.  After evaluation and examination she was sent back downstairs.  No change to clinical presentation.  Currently denies depression denies suicidal ideation denies psychosis.  Discomfort in the abdomen has resolved Associated Signs/Symptoms: Depression Symptoms:  anhedonia, (Hypo) Manic Symptoms:  Impulsivity, Anxiety Symptoms:  Excessive Worry, Psychotic Symptoms:  None PTSD Symptoms: Negative Total Time spent with patient: 20 minutes  Past Psychiatric History: Long history of chronic substance abuse mood disorder multiple previous hospitalizations  Is the patient at risk to self? No.  Has the patient been a risk to self in the past 6 months? No.  Has the patient been a risk to self within the distant past? Yes.    Is the patient a risk to others? Yes.    Has the patient been a risk to others in the past 6 months? No.  Has the patient been a risk to others within the distant past? No.   Prior Inpatient Therapy:   Prior Outpatient Therapy:    Alcohol Screening: 1. How often do you have a drink containing alcohol?: 2 to 4 times a month 2. How many drinks containing alcohol do you have on a typical day when you are drinking?: 3 or 4 3. How often do you have six or more drinks on one occasion?: Never AUDIT-C Score: 3 4. How often during the last year have you found that you were not able to stop drinking once  you had started?: Never 5. How often during the last year have you failed to do what was normally expected from you because of drinking?: Never 6. How often during the last year have you needed a first drink in the morning to get yourself going after a heavy drinking session?: Never 7. How often during the last year have you had a feeling of guilt of remorse after drinking?: Never 8. How often during the last year have you been unable to remember what happened the night before because you had been drinking?: Never 9. Have you or someone else been injured as a result of your drinking?: No 10. Has a relative or friend or a doctor or another health worker been concerned about your drinking or suggested you cut down?: No Alcohol Use Disorder Identification Test Final Score (AUDIT): 3 Alcohol Brief Interventions/Follow-up: AUDIT Score <7 follow-up not indicated Substance Abuse History in the last 12 months:  Yes.   Consequences of Substance Abuse: Negative Previous Psychotropic Medications: Yes  Psychological Evaluations: Yes  Past Medical History:  Past Medical History:  Diagnosis Date  . Anxiety   . Depression   . History of substance abuse ([place])   . History of suicide attempt   . History of thyroid disease   . Hypertension   . Thyroid disease     Past Surgical History:  Procedure Laterality Date  . CESAREAN SECTION  2000, 2002, 2013   Family History:  Family History  Problem Relation Age of Onset  . Diabetes Mother    Family Psychiatric  History: See previous Tobacco  Screening:   Social History:  Social History   Substance and Sexual Activity  Alcohol Use Yes   Comment: social drinker     Social History   Substance and Sexual Activity  Drug Use Yes  . Types: Cocaine, Marijuana   Comment: Positive UDS 12/2017    Additional Social History:    Specify valuables returned: clothes                      Allergies:   Allergies  Allergen Reactions  . Aspirin  Anaphylaxis, Diarrhea and Nausea And Vomiting  . Banana Anaphylaxis, Itching, Shortness Of Breath and Swelling  . Other Itching, Shortness Of Breath and Swelling  . Peanut Oil Itching, Shortness Of Breath and Swelling  . Peanut-Containing Drug Products Shortness Of Breath, Itching and Swelling  . Pecan Nut (Diagnostic) Anaphylaxis   Lab Results:  Results for orders placed or performed during the hospital encounter of 06/14/20 (from the past 48 hour(s))  Wet prep, genital     Status: Abnormal   Collection Time: 06/14/20 11:11 PM  Result Value Ref Range   Yeast Wet Prep HPF POC NONE SEEN NONE SEEN   Trich, Wet Prep PRESENT (A) NONE SEEN   Clue Cells Wet Prep HPF POC PRESENT (A) NONE SEEN   WBC, Wet Prep HPF POC MANY (A) NONE SEEN   Sperm NONE SEEN     Comment: Performed at [facility], [address] [place], [place] [address]    Blood Alcohol level:  Lab Results  Component Value Date   ETH 89 (H) 06/02/2020   ETH <10 05/11/2020    Metabolic Disorder Labs:  Lab Results  Component Value Date   HGBA1C 5.5 06/05/2020   MPG 111.15 06/05/2020   MPG 99.67 01/02/2018   No results found for: PROLACTIN Lab Results  Component Value Date   CHOL 218 (H) 01/02/2018   TRIG 187 (H) 01/02/2018   HDL 81 01/02/2018   CHOLHDL 2.7 01/02/2018   VLDL 37 01/02/2018   LDLCALC 100 (H) 01/02/2018   LDLCALC 82 01/12/2016    Current Medications: No current facility-administered medications for this encounter.   Current Outpatient Medications  Medication Sig Dispense Refill  . diphenhydrAMINE (BENADRYL) 50 MG capsule Take 1 capsule (50 mg total) by mouth at bedtime as needed (PRN for sleep per MD orders). 30 capsule 1  . docusate sodium (COLACE) 100 MG capsule Take 2 capsules (200 mg total) by mouth 2 (two) times daily. 120 capsule 1  . hydrocortisone cream 1 % Apply topically 2 (two) times daily. To affected area 30 g 1  . metroNIDAZOLE (FLAGYL) 500 MG tablet Take 1 tablet (500 mg  total) by mouth every 12 (twelve) hours. 14 tablet 0  . polyethylene glycol (MIRALAX / GLYCOLAX) 17 g packet Take 17 g by mouth daily. 30 each 1  . sertraline (ZOLOFT) 25 MG tablet Take 3 tablets (75 mg total) by mouth daily. 90 tablet 1   PTA Medications: No medications prior to admission.    Musculoskeletal: Strength & Muscle Tone: within normal limits Gait & Station: normal Patient leans: N/A  Psychiatric Specialty Exam: Physical Exam  Nursing note and vitals reviewed. Constitutional: She appears well-developed.  HENT:  Head: Normocephalic and atraumatic.  Eyes: Pupils are equal, round, and reactive to light. Conjunctivae are normal.  Cardiovascular: Normal heart sounds.  Respiratory: Effort normal.  GI: Soft.  Musculoskeletal:        General: Normal range of motion.  Cervical back: Normal range of motion.  Neurological: She is alert.  Skin: Skin is warm and dry.  Psychiatric: Mood normal.    Review of Systems  Constitutional: Negative.   HENT: Negative.   Eyes: Negative.   Respiratory: Negative.   Cardiovascular: Negative.   Gastrointestinal: Negative.   Musculoskeletal: Negative.   Skin: Negative.   Neurological: Negative.   Psychiatric/Behavioral: Negative.     Height 4\' 11"  (1.499 m), weight 72.1 kg, last menstrual period 12/04/2019.Body mass index is 32.1 kg/m.  General Appearance: Casual  Eye Contact:  Good  Speech:  Clear and Coherent  Volume:  Normal  Mood:  Euthymic  Affect:  Congruent  Thought Process:  Goal Directed  Orientation:  Full (Time, Place, and Person)  Thought Content:  Logical  Suicidal Thoughts:  No  Homicidal Thoughts:  No  Memory:  Immediate;   Fair Recent;   Fair Remote;   Fair  Judgement:  Fair  Insight:  Fair  Psychomotor Activity:  Normal  Concentration:  Concentration: Fair  Recall:  14/09/2019 of Knowledge:  Fair  Language:  Fair  Akathisia:  No  Handed:  Right  AIMS (if indicated):     Assets:  Desire for  Improvement  ADL's:  Intact  Cognition:  WNL  Sleep:  Number of Hours: 3    Treatment Plan Summary: Plan Readmit to the psychiatric unit.  Plan had been in place to discharge her today anyway which will probably be followed through.  No change to medicine or precautions  Observation Level/Precautions:  15 minute checks  Laboratory:  Chemistry Profile  Psychotherapy:    Medications:    Consultations:    Discharge Concerns:    Estimated LOS:  Other:     Physician Treatment Plan for Primary Diagnosis: MDD (major depressive disorder), recurrent episode, severe ([facility]) Long Term Goal(s): Improvement in symptoms so as ready for discharge  Short Term Goals: Ability to demonstrate self-control will improve  Physician Treatment Plan for Secondary Diagnosis: Principal Problem:   MDD (major depressive disorder), recurrent episode, severe ([facility]) Active Problems:   Stimulant use disorder ([facility]) (cocaine)  Long Term Goal(s): Improvement in symptoms so as ready for discharge  Short Term Goals: Ability to demonstrate self-control will improve  I certify that inpatient services furnished can reasonably be expected to improve the patient's condition.    [employer], MD 6/23/20213:04 PM

## 2020-07-01 ENCOUNTER — Emergency Department: Payer: Medicaid Other

## 2020-07-01 ENCOUNTER — Emergency Department
Admission: EM | Admit: 2020-07-01 | Discharge: 2020-07-02 | Discharge status: Against Medical Advice | Payer: Medicaid Other | Attending: Emergency Medicine | Admitting: Emergency Medicine

## 2020-07-01 ENCOUNTER — Encounter: Payer: Self-pay | Admitting: *Deleted

## 2020-07-01 ENCOUNTER — Other Ambulatory Visit: Payer: Self-pay

## 2020-07-01 DIAGNOSIS — Z87891 Personal history of nicotine dependence: Secondary | ICD-10-CM | POA: Insufficient documentation

## 2020-07-01 DIAGNOSIS — M25551 Pain in right hip: Secondary | ICD-10-CM | POA: Insufficient documentation

## 2020-07-01 DIAGNOSIS — F1020 Alcohol dependence, uncomplicated: Secondary | ICD-10-CM | POA: Diagnosis present

## 2020-07-01 DIAGNOSIS — R45851 Suicidal ideations: Secondary | ICD-10-CM | POA: Diagnosis present

## 2020-07-01 DIAGNOSIS — F332 Major depressive disorder, recurrent severe without psychotic features: Secondary | ICD-10-CM | POA: Diagnosis present

## 2020-07-01 DIAGNOSIS — X789XXA Intentional self-harm by unspecified sharp object, initial encounter: Secondary | ICD-10-CM | POA: Diagnosis present

## 2020-07-01 DIAGNOSIS — O99891 Other specified diseases and conditions complicating pregnancy: Secondary | ICD-10-CM | POA: Insufficient documentation

## 2020-07-01 DIAGNOSIS — F3164 Bipolar disorder, current episode mixed, severe, with psychotic features: Secondary | ICD-10-CM | POA: Diagnosis present

## 2020-07-01 DIAGNOSIS — F1590 Other stimulant use, unspecified, uncomplicated: Secondary | ICD-10-CM | POA: Diagnosis present

## 2020-07-01 DIAGNOSIS — Z91010 Allergy to peanuts: Secondary | ICD-10-CM | POA: Insufficient documentation

## 2020-07-01 DIAGNOSIS — M79651 Pain in right thigh: Secondary | ICD-10-CM | POA: Insufficient documentation

## 2020-07-01 DIAGNOSIS — F1220 Cannabis dependence, uncomplicated: Secondary | ICD-10-CM | POA: Diagnosis present

## 2020-07-01 DIAGNOSIS — F1420 Cocaine dependence, uncomplicated: Secondary | ICD-10-CM | POA: Diagnosis present

## 2020-07-01 DIAGNOSIS — I10 Essential (primary) hypertension: Secondary | ICD-10-CM | POA: Diagnosis not present

## 2020-07-01 DIAGNOSIS — Z3A30 30 weeks gestation of pregnancy: Secondary | ICD-10-CM | POA: Insufficient documentation

## 2020-07-01 DIAGNOSIS — F329 Major depressive disorder, single episode, unspecified: Secondary | ICD-10-CM | POA: Diagnosis present

## 2020-07-01 DIAGNOSIS — R4589 Other symptoms and signs involving emotional state: Secondary | ICD-10-CM | POA: Diagnosis present

## 2020-07-01 LAB — URINALYSIS, COMPLETE (UACMP) WITH MICROSCOPIC
Bacteria, UA: NONE SEEN
Bilirubin Urine: NEGATIVE
Glucose, UA: NEGATIVE mg/dL
Hgb urine dipstick: NEGATIVE
Ketones, ur: 5 mg/dL — AB
Leukocytes,Ua: NEGATIVE
Nitrite: NEGATIVE
Protein, ur: NEGATIVE mg/dL
Specific Gravity, Urine: 1.027 (ref 1.005–1.030)
pH: 5 (ref 5.0–8.0)

## 2020-07-01 LAB — GLUCOSE, CAPILLARY: Glucose-Capillary: 79 mg/dL (ref 70–99)

## 2020-07-01 MED ORDER — ACETAMINOPHEN 325 MG PO TABS: 650.0000 mg | ORAL_TABLET | Freq: Once | ORAL | Status: DC

## 2020-07-01 NOTE — ED Notes (Signed)
Pt resting with even, easy, snoring respirations

## 2020-07-01 NOTE — ED Notes (Signed)
No test at this time per dr Mayford Knife.

## 2020-07-01 NOTE — ED Provider Notes (Signed)
[facility] Emergency Department Provider Note  ____________________________________________  Time seen: Approximately 9:40 PM  I have reviewed the triage vital signs and the nursing notes.   HISTORY  Chief Complaint Hip Pain    HPI [name] is a 34 y.o. female with PMH HTN, substance abuse, suicide attempt, depression, bipolar that is [redacted] weeks pregnant that presents to the emergency department for evaluation of non traumatic right hip pain and right lateral thigh pain today. She denies any pregnancy concerns.  Patient is feeling the baby move normally.  Patient was recently treated for urinary tract infection.  She has been tested recently for STDs.  No trauma.  No nausea, vomiting, abdominal pain, urinary symptoms, vaginal bleeding or vaginal discharge.  Patient was discharged from [facility] yesterday after being hospitalized from 6/25-7/7, admitted under IVC for substance induced mood disorder. She was previously admitted to [facility] for depression. Patient denies any suicidal ideation currently.  She does not wish to talk to psychiatry today.     Past Medical History:  Diagnosis Date  . Anxiety   . Depression   . History of substance abuse (HCC)   . History of suicide attempt   . History of thyroid disease   . Hypertension   . Thyroid disease     Patient Active Problem List   Diagnosis Date Noted  . Cramping complicating pregnancy, antepartum 06/14/2020  . History of preterm labor, current pregnancy, second trimester   . Severe recurrent major depression without psychotic features (HCC) 06/03/2020  . Fall 05/28/2020  . History of cesarean delivery 05/28/2020  . Bipolar disorder, curr episode mixed, severe, with psychotic features (HCC)   . MDD (major depressive disorder), recurrent episode, severe (HCC) 04/08/2020  . Thoughts of self harm 02/01/2020  . Abdominal pain 03/20/2018  . Cocaine use disorder, moderate, dependence (HCC) 01/01/2018  .  Cannabis use disorder, moderate, dependence (HCC) 01/01/2018  . Pregnant 12/31/2017  . Medically noncompliant 12/31/2017  . Tobacco use disorder 01/12/2016  . Stimulant use disorder (HCC) (cocaine) 01/12/2016  . Alcohol use disorder, moderate, dependence (HCC) 01/12/2016  . Self-inflicted laceration of wrist (HCC) 01/11/2016  . MDD (major depressive disorder) 01/11/2016    Past Surgical History:  Procedure Laterality Date  . CESAREAN SECTION  2000, 2002, 2013    Prior to Admission medications   Medication Sig Start Date End Date Taking? Authorizing Provider  diphenhydrAMINE (BENADRYL) 50 MG capsule Take 1 capsule (50 mg total) by mouth at bedtime as needed (PRN for sleep per MD orders). 06/15/20   Clapacs, Jackquline Denmark, MD  docusate sodium (COLACE) 100 MG capsule Take 2 capsules (200 mg total) by mouth 2 (two) times daily. 06/15/20   Clapacs, Jackquline Denmark, MD  hydrocortisone cream 1 % Apply topically 2 (two) times daily. To affected area 06/15/20   Clapacs, Jackquline Denmark, MD  metroNIDAZOLE (FLAGYL) 500 MG tablet Take 1 tablet (500 mg total) by mouth every 12 (twelve) hours. 06/15/20   Clapacs, Jackquline Denmark, MD  polyethylene glycol (MIRALAX / GLYCOLAX) 17 g packet Take 17 g by mouth daily. 06/16/20   Clapacs, Jackquline Denmark, MD  sertraline (ZOLOFT) 25 MG tablet Take 3 tablets (75 mg total) by mouth daily. 06/16/20   Clapacs, Jackquline Denmark, MD    Allergies Aspirin, Banana, Other, Peanut oil, Peanut-containing drug products, and Pecan nut (diagnostic)  Family History  Problem Relation Age of Onset  . Diabetes Mother     Social History Social History   Tobacco Use  .  Smoking status: Former Smoker    Packs/day: 0.50  . Smokeless tobacco: Never Used  Substance Use Topics  . Alcohol use: Not Currently    Comment: social drinker  . Drug use: Not Currently    Types: Cocaine, Marijuana    Comment: Positive UDS 12/2017     Review of Systems  Cardiovascular: No chest pain. Respiratory: No SOB. Gastrointestinal: No  abdominal pain.  No nausea, no vomiting.  Musculoskeletal: Positive for hip and thigh pain. Skin: Negative for rash, abrasions, lacerations, ecchymosis. Neurological: Negative for headaches, numbness or tingling   ____________________________________________   PHYSICAL EXAM:  VITAL SIGNS: ED Triage Vitals  Enc Vitals Group     BP 07/01/20 2038 (!) 117/53     Pulse Rate 07/01/20 2038 100     Resp 07/01/20 2038 20     Temp 07/01/20 2038 98.7 F (37.1 C)     Temp Source 07/01/20 2038 Oral     SpO2 07/01/20 2038 99 %     Weight 07/01/20 2039 167 lb (75.8 kg)     Height 07/01/20 2039 4\' 11"  (1.499 m)     Head Circumference --      Peak Flow --      Pain Score 07/01/20 2039 10     Pain Loc --      Pain Edu? --      Excl. in GC? --      Constitutional: Alert and oriented. Well appearing and in no acute distress. Eyes: Conjunctivae are normal. PERRL. EOMI. Head: Atraumatic. ENT:      Ears:      Nose: No congestion/rhinnorhea.      Mouth/Throat: Mucous membranes are moist.  Neck: No stridor.  Cardiovascular: Normal rate, regular rhythm.  Good peripheral circulation. Respiratory: Normal respiratory effort without tachypnea or retractions. Lungs CTAB. Good air entry to the bases with no decreased or absent breath sounds. Gastrointestinal: Bowel sounds 4 quadrants. Soft and nontender to palpation. No guarding or rigidity. No palpable masses. No distention.  Musculoskeletal: Full range of motion to all extremities. No gross deformities appreciated.  Mild tenderness to palpation to right trochanteric bursa and right lateral thigh. Neurologic:  Normal speech and language. No gross focal neurologic deficits are appreciated.  Skin:  Skin is warm, dry and intact. No rash noted. Psychiatric: Mood and affect are normal. Speech and behavior are normal. Patient exhibits appropriate insight and judgement.   ____________________________________________   LABS (all labs ordered are  listed, but only abnormal results are displayed)  Labs Reviewed  GLUCOSE, CAPILLARY  URINALYSIS, COMPLETE (UACMP) WITH MICROSCOPIC   ____________________________________________  EKG   ____________________________________________  RADIOLOGY   No results found.  ____________________________________________    PROCEDURES  Procedure(s) performed:    Procedures    Medications - No data to display   ____________________________________________   INITIAL IMPRESSION / ASSESSMENT AND PLAN / ED COURSE  Pertinent labs & imaging results that were available during my care of the patient were reviewed by me and considered in my medical decision making (see chart for details).  Review of the [place] CSRS was performed in accordance of the [facility] prior to dispensing any controlled drugs.     Patient presented to emergency department for evaluation of hip pain.  Vital signs and exam are reassuring.  Ultrasound negative for DVT.  Urinalysis noncontributory for infection.  Patient denies any vaginal bleeding or abdominal pain.  Fetal heart tones at 166 bpm.  Hip and thigh pain are likely musculoskeletal.  Patient  was given Tylenol for pain.  Patient initially declined speaking with psychiatry.  Prior to discharge, she states that her depression is worsening and would like to speak with psychiatry.  Patient denies any suicidal ideation or homicidal ideation.  Patient was moved to the main side of the emergency department for psychiatric evaluation.  Report was given to Dr. Erma Heritage.   [name] was evaluated in Emergency Department on 07/01/2020 for the symptoms described in the history of present illness. She was evaluated in the context of the global COVID-19 pandemic, which necessitated consideration that the patient might be at risk for infection with the SARS-CoV-2 virus that causes COVID-19. Institutional protocols and algorithms that pertain to the evaluation of patients at risk  for COVID-19 are in a state of rapid change based on information released by regulatory bodies including the CDC and federal and state organizations. These policies and algorithms were followed during the patient's care in the ED.  ____________________________________________  FINAL CLINICAL IMPRESSION(S) / ED DIAGNOSES  Final diagnoses:  None      NEW MEDICATIONS STARTED DURING THIS VISIT:  ED Discharge Orders    None          This chart was dictated using voice recognition software/Dragon. Despite best efforts to proofread, errors can occur which can change the meaning. Any change was purely unintentional.    Enid Derry, PA-C 07/01/20 2336    Shaune Pollack, MD 07/03/20 2156

## 2020-07-01 NOTE — ED Triage Notes (Signed)
Pt brought in via ems from home with right hip pain/upper leg pain.  No known injury.  No back pain.  Pt is [redacted] weeks pregnant.  Pt states pain began last night while walking.  Pt alert  Speech clear.

## 2020-07-01 NOTE — ED Notes (Signed)
When PA Wagner entered room with patient's discharge papers, pt voiced concerns that her depression was worsening and wanted to talk to psychiatrist. Pt moved to main ED area at this time. Pt asleep and in NAD when this RN moved patient to room.

## 2020-07-02 ENCOUNTER — Emergency Department
Admission: EM | Admit: 2020-07-02 | Discharge: 2020-07-02 | Disposition: A | Discharge status: Home / Self Care | Payer: Medicaid Other | Source: Home / Self Care | Attending: Emergency Medicine | Admitting: Emergency Medicine

## 2020-07-02 ENCOUNTER — Encounter: Payer: Self-pay | Admitting: Emergency Medicine

## 2020-07-02 DIAGNOSIS — Z87891 Personal history of nicotine dependence: Secondary | ICD-10-CM | POA: Insufficient documentation

## 2020-07-02 DIAGNOSIS — O99893 Other specified diseases and conditions complicating puerperium: Secondary | ICD-10-CM | POA: Insufficient documentation

## 2020-07-02 DIAGNOSIS — Z3A30 30 weeks gestation of pregnancy: Secondary | ICD-10-CM | POA: Insufficient documentation

## 2020-07-02 DIAGNOSIS — Z91010 Allergy to peanuts: Secondary | ICD-10-CM | POA: Insufficient documentation

## 2020-07-02 DIAGNOSIS — M79604 Pain in right leg: Secondary | ICD-10-CM

## 2020-07-02 DIAGNOSIS — I10 Essential (primary) hypertension: Secondary | ICD-10-CM | POA: Insufficient documentation

## 2020-07-02 HISTORY — DX: Gestational diabetes mellitus in pregnancy, unspecified control: O24.419

## 2020-07-02 LAB — GLUCOSE, CAPILLARY: Glucose-Capillary: 87 mg/dL (ref 70–99)

## 2020-07-02 MED ORDER — CYCLOBENZAPRINE HCL 10 MG PO TABS
5.0000 mg | ORAL_TABLET | Freq: Once | ORAL | Status: AC
  Administered 2020-07-02: 5 mg via ORAL
  Filled 2020-07-02: qty 1

## 2020-07-02 MED ORDER — ACETAMINOPHEN 325 MG PO TABS
650.0000 mg | ORAL_TABLET | Freq: Four times a day (QID) | ORAL | 0 refills | Status: DC | PRN
Start: 2020-07-02 — End: 2020-07-15

## 2020-07-02 MED ORDER — ACETAMINOPHEN 325 MG PO TABS
650.0000 mg | ORAL_TABLET | Freq: Once | ORAL | Status: AC
  Administered 2020-07-02: 650 mg via ORAL
  Filled 2020-07-02: qty 2

## 2020-07-02 NOTE — Consult Note (Signed)
[facility]   Reason for Consult: Suicidal Ideation Referring Physician: Dr. Fuller PlanFunke Patient Identification: [name] MRN:  [id number] Principal Diagnosis: <principal problem not specified> Diagnosis:  Active Problems:   Self-inflicted laceration of wrist (HCC)   MDD (major depressive disorder)   Stimulant use disorder (HCC) (cocaine)   Alcohol use disorder, moderate, dependence (HCC)   Cocaine use disorder, moderate, dependence (HCC)   Cannabis use disorder, moderate, dependence (HCC)   Thoughts of self harm   MDD (major depressive disorder), recurrent episode, severe (HCC)   Bipolar disorder, curr episode mixed, severe, with psychotic features (HCC)   Severe recurrent major depression without psychotic features (HCC)   Total Time spent with patient: 20 minutes  Subjective:  "I am here because when I am by myself I drink" [name] is a 34 y.o. female patient [facility] from home voluntarily with right hip pain/upper leg pain. No known injury. No back pain. The patient is [redacted] weeks pregnant.  The patient states pain began last night while walking. The patient is alert  Speech clear.  The [facility] after being hospitalized from 6/25-7/7, admitted under IVC for a substance-induced mood disorder. She was previously [facility] for [facility]. The patient denies any suicidal ideation currently.  She does not wish to talk to psychiatry today. When PA Loreta AveWagner entered the room with the patient's discharge papers, pt voiced concerns that her depression was worsening and wanted to speak to a psychiatrist. The patient was seen face-to-face by this provider; the chart was reviewed and consulted with Dr.Isaacs/ Ms. Laural BenesJohnson RN on 07/02/2020 due to the patient's care. It was discussed with the EDP that the patient is not a safety risk to self or others and does not require psychiatric inpatient admission for  stabilization and treatment. The patient is alert and oriented x 4, angry, uncooperative, and mood-congruent with affect evaluation. The patient does not appear to be responding to internal or external stimuli. The patient is not presenting with delusional thinking. The patient denies auditory or visual hallucinations. The patient denies suicidal, self-harm, and homicidal ideations. The patient is not presenting with any psychotic or paranoid behaviors. During an encounter with the patient, she was able to answer questions appropriately.  Plan: The Patient is not a safety risk to self or others and does not require psychiatric inpatient admission for stabilization and treatment.  HPI:  Per Loreta AveWagner, A, PA-C : [name] is a 34 y.o. female, G6 P3-1-1-3 at approximately 26 weeks of pregnancy, with history of depression and substance abuse, who presents to the ED complaining of suicidal ideation.  Patient reports that she has been feeling increasingly depressed recently and is having thoughts of harming herself.  She denies any specific plan, but does admit she has been drinking alcohol and using drugs.  She reports having "a few shots" tonight and has been drinking intermittently during this pregnancy.  She also admits to recent cocaine use, which she has been snorting.  She denies any abdominal pain or vaginal bleeding, has recently been feeling baby moving.  She denies any medical complaints at this time.  Past Psychiatric History:  Anxiety Depression History of substance abuse (HCC) History of suicide attempt  Risk to Self:  No Risk to Others:  No Prior Inpatient Therapy:  Yes Prior Outpatient Therapy:  Yes  Past Medical History:  Past Medical History:  Diagnosis Date  . Anxiety   .  Depression   . History of substance abuse (HCC)   . History of suicide attempt   . History of thyroid disease   . Hypertension   . Thyroid disease     Past Surgical History:  Procedure Laterality  Date  . CESAREAN SECTION  2000, 2002, 2013   Family History:  Family History  Problem Relation Age of Onset  . Diabetes Mother    Family Psychiatric  History:  Social History:  Social History   Substance and Sexual Activity  Alcohol Use Not Currently   Comment: social drinker     Social History   Substance and Sexual Activity  Drug Use Not Currently  . Types: Cocaine, Marijuana   Comment: Positive UDS 12/2017    Social History   Socioeconomic History  . Marital status: Single    Spouse name: Not on file  . Number of children: Not on file  . Years of education: Not on file  . Highest education level: Not on file  Occupational History  . Not on file  Tobacco Use  . Smoking status: Former Smoker    Packs/day: 0.50  . Smokeless tobacco: Never Used  Substance and Sexual Activity  . Alcohol use: Not Currently    Comment: social drinker  . Drug use: Not Currently    Types: Cocaine, Marijuana    Comment: Positive UDS 12/2017  . Sexual activity: Not Currently    Birth control/protection: None  Other Topics Concern  . Not on file  Social History Narrative  . Not on file   Social Determinants of Health   Financial Resource Strain:   . Difficulty of Paying Living Expenses:   Food Insecurity:   . Worried About Programme researcher, broadcasting/film/video in the Last Year:   . Barista in the Last Year:   Transportation Needs:   . Freight forwarder (Medical):   Marland Kitchen Lack of Transportation (Non-Medical):   Physical Activity:   . Days of Exercise per Week:   . Minutes of Exercise per Session:   Stress:   . Feeling of Stress :   Social Connections:   . Frequency of Communication with Friends and Family:   . Frequency of Social Gatherings with Friends and Family:   . Attends Religious Services:   . Active Member of Clubs or Organizations:   . Attends Banker Meetings:   Marland Kitchen Marital Status:    Additional Social History:    Allergies:   Allergies  Allergen Reactions   . Aspirin Anaphylaxis, Diarrhea and Nausea And Vomiting  . Banana Anaphylaxis, Itching, Shortness Of Breath and Swelling  . Other Itching, Shortness Of Breath and Swelling  . Peanut Oil Itching, Shortness Of Breath and Swelling  . Peanut-Containing Drug Products Shortness Of Breath, Itching and Swelling  . Pecan Nut (Diagnostic) Anaphylaxis    Labs:  Results for orders placed or performed during the hospital encounter of 07/01/20 (from the past 48 hour(s))  Glucose, capillary     Status: None   Collection Time: 07/01/20  9:10 PM  Result Value Ref Range   Glucose-Capillary 79 70 - 99 mg/dL    Comment: Glucose reference range applies only to samples taken after fasting for at least 8 hours.  Urinalysis, Complete w Microscopic     Status: Abnormal   Collection Time: 07/01/20  9:21 PM  Result Value Ref Range   Color, Urine YELLOW (A) YELLOW   APPearance HAZY (A) CLEAR   Specific Gravity, Urine 1.027  1.005 - 1.030   pH 5.0 5.0 - 8.0   Glucose, UA NEGATIVE NEGATIVE mg/dL   Hgb urine dipstick NEGATIVE NEGATIVE   Bilirubin Urine NEGATIVE NEGATIVE   Ketones, ur 5 (A) NEGATIVE mg/dL   Protein, ur NEGATIVE NEGATIVE mg/dL   Nitrite NEGATIVE NEGATIVE   Leukocytes,Ua NEGATIVE NEGATIVE   WBC, UA 0-5 0 - 5 WBC/hpf   Bacteria, UA NONE SEEN NONE SEEN   Squamous Epithelial / LPF 6-10 0 - 5   Mucus PRESENT     Comment: Performed at [facility], [address] [place], [place] [address]    Current Facility-Administered Medications  Medication Dose Route Frequency Provider Last Rate Last Admin  . acetaminophen (TYLENOL) tablet 650 mg  650 mg Oral Once Enid Derry, PA-C       Current Outpatient Medications  Medication Sig Dispense Refill  . diphenhydrAMINE (BENADRYL) 50 MG capsule Take 1 capsule (50 mg total) by mouth at bedtime as needed (PRN for sleep per MD orders). 30 capsule 1  . docusate sodium (COLACE) 100 MG capsule Take 2 capsules (200 mg total) by mouth 2 (two) times  daily. 120 capsule 1  . hydrocortisone cream 1 % Apply topically 2 (two) times daily. To affected area 30 g 1  . metroNIDAZOLE (FLAGYL) 500 MG tablet Take 1 tablet (500 mg total) by mouth every 12 (twelve) hours. 14 tablet 0  . polyethylene glycol (MIRALAX / GLYCOLAX) 17 g packet Take 17 g by mouth daily. 30 each 1  . sertraline (ZOLOFT) 25 MG tablet Take 3 tablets (75 mg total) by mouth daily. 90 tablet 1    Musculoskeletal: Strength & Muscle Tone: within normal limits Gait & Station: normal Patient leans: N/A  Psychiatric Specialty Exam: Physical Exam Vitals and nursing note reviewed.  Cardiovascular:     Rate and Rhythm: Normal rate.  Pulmonary:     Effort: Pulmonary effort is normal.  Musculoskeletal:        General: Normal range of motion.     Cervical back: Normal range of motion and neck supple.  Neurological:     Mental Status: She is alert and oriented to person, place, and time.  Psychiatric:        Attention and Perception: Attention and perception normal.        Mood and Affect: Affect is flat and angry.        Speech: Speech normal.        Behavior: Behavior is uncooperative and aggressive.        Thought Content: Thought content normal.        Cognition and Memory: Cognition normal.        Judgment: Judgment normal.     Review of Systems  Psychiatric/Behavioral: Positive for agitation. The patient is nervous/anxious.   All other systems reviewed and are negative.   Blood pressure (!) 117/53, pulse 100, temperature 98.7 F (37.1 C), temperature source Oral, resp. rate 20, height 4\' 11"  (1.499 m), weight 75.8 kg, last menstrual period 12/04/2019, SpO2 99 %.Body mass index is 33.73 kg/m.  General Appearance: Disheveled  Eye Contact:  Absent  Speech:  Clear and Coherent  Volume:  Decreased  Mood:  Irritable  Affect:  Appropriate  Thought Process:  Coherent  Orientation:  Full (Time, Place, and Person)  Thought Content:  WDL  Suicidal Thoughts:  No   Homicidal Thoughts:  No  Memory:  Immediate;   Fair Recent;   Fair Remote;   Fair  Judgement:  Impaired  Insight:  Lacking  Psychomotor Activity:  Normal  Concentration:  Concentration: Fair and Attention Span: Fair  Recall:  [employer] of Knowledge:  Fair  Language:  Fair  Akathisia:  Negative  Handed:  Right  AIMS (if indicated):     Assets:  Communication Skills Desire for Improvement Financial Resources/Insurance Physical Health Resilience Social Support  ADL's:  Intact  Cognition:  WNL  Sleep:   Okay     Treatment Plan Summary: Plan The patient does not meet criteria for psychiatric inpatient admission.  Disposition: No evidence of imminent risk to self or others at present.   Patient does not meet criteria for psychiatric inpatient admission. Supportive therapy provided about ongoing stressors. Refer to IOP. Discussed crisis plan, support from social network, calling 911, coming to the Emergency Department, and calling Suicide Hotline.  Gillermo Murdoch, NP 07/02/2020 2:03 AM

## 2020-07-02 NOTE — ED Notes (Signed)
Pt requesting to leave. MD made aware

## 2020-07-02 NOTE — ED Notes (Signed)
PT ambulated to restroom.

## 2020-07-02 NOTE — ED Provider Notes (Signed)
[facility] Emergency Department Provider Note  ____________________________________________   First MD Initiated Contact with Patient 07/02/20 [phone]     (approximate)  I have reviewed the triage vital signs and the nursing notes.   HISTORY  Chief Complaint Leg Pain    HPI [name] is a 34 y.o. female with past medical history as below here with right leg pain.  The patient was just seen and discharged for similar complaints.  She had a negative ultrasound.  She is currently [redacted] weeks pregnant.  She states that she went out to the waiting room, and while waiting for her ride, her thigh pain returned, so she presents for evaluation. No new trauma. No numbness or weakness. No fever, chills.  No abd pain, vaginal bleeding, LOF. +FM.       Past Medical History:  Diagnosis Date   Anxiety    Depression    Gestational diabetes    History of substance abuse (HCC)    History of suicide attempt    History of thyroid disease    Hypertension    Thyroid disease     Patient Active Problem List   Diagnosis Date Noted   Cramping complicating pregnancy, antepartum 06/14/2020   History of preterm labor, current pregnancy, second trimester    Severe recurrent major depression without psychotic features (HCC) 06/03/2020   Fall 05/28/2020   History of cesarean delivery 05/28/2020   Bipolar disorder, curr episode mixed, severe, with psychotic features (HCC)    MDD (major depressive disorder), recurrent episode, severe (HCC) 04/08/2020   Thoughts of self harm 02/01/2020   Abdominal pain 03/20/2018   Cocaine use disorder, moderate, dependence (HCC) 01/01/2018   Cannabis use disorder, moderate, dependence (HCC) 01/01/2018   Pregnant 12/31/2017   Medically noncompliant 12/31/2017   Tobacco use disorder 01/12/2016   Stimulant use disorder (HCC) (cocaine) 01/12/2016   Alcohol use disorder, moderate, dependence (HCC) 01/12/2016    Self-inflicted laceration of wrist (HCC) 01/11/2016   MDD (major depressive disorder) 01/11/2016    Past Surgical History:  Procedure Laterality Date   CESAREAN SECTION  2000, 2002, 2013   WISDOM TOOTH EXTRACTION      Prior to Admission medications   Medication Sig Start Date End Date Taking? Authorizing Provider  acetaminophen (TYLENOL) 325 MG tablet Take 2 tablets (650 mg total) by mouth every 6 (six) hours as needed for moderate pain. 07/02/20 07/02/21  Shaune Pollack, MD  diphenhydrAMINE (BENADRYL) 50 MG capsule Take 1 capsule (50 mg total) by mouth at bedtime as needed (PRN for sleep per MD orders). 06/15/20   Clapacs, Jackquline Denmark, MD  docusate sodium (COLACE) 100 MG capsule Take 2 capsules (200 mg total) by mouth 2 (two) times daily. 06/15/20   Clapacs, Jackquline Denmark, MD  hydrocortisone cream 1 % Apply topically 2 (two) times daily. To affected area 06/15/20   Clapacs, Jackquline Denmark, MD  metroNIDAZOLE (FLAGYL) 500 MG tablet Take 1 tablet (500 mg total) by mouth every 12 (twelve) hours. 06/15/20   Clapacs, Jackquline Denmark, MD  polyethylene glycol (MIRALAX / GLYCOLAX) 17 g packet Take 17 g by mouth daily. 06/16/20   Clapacs, Jackquline Denmark, MD  sertraline (ZOLOFT) 25 MG tablet Take 3 tablets (75 mg total) by mouth daily. 06/16/20   Clapacs, Jackquline Denmark, MD    Allergies Aspirin, Banana, Other, Peanut oil, Peanut-containing drug products, and Pecan nut (diagnostic)  Family History  Problem Relation Age of Onset   Diabetes Mother     Social  History Social History   Tobacco Use   Smoking status: Former Smoker    Packs/day: 0.50   Smokeless tobacco: Never Used  Substance Use Topics   Alcohol use: Not Currently    Comment: social drinker   Drug use: Not Currently    Types: Cocaine, Marijuana    Comment: Positive UDS 12/2017    Review of Systems  Review of Systems  Constitutional: Negative for chills and fever.  HENT: Negative for sore throat.   Respiratory: Negative for shortness of breath.   Cardiovascular:  Negative for chest pain.  Gastrointestinal: Negative for abdominal pain.  Genitourinary: Negative for flank pain.  Musculoskeletal: Positive for arthralgias. Negative for neck pain.  Skin: Negative for rash and wound.  Allergic/Immunologic: Negative for immunocompromised state.  Neurological: Negative for weakness and numbness.  Hematological: Does not bruise/bleed easily.  All other systems reviewed and are negative.    ____________________________________________  PHYSICAL EXAM:      VITAL SIGNS: ED Triage Vitals  Enc Vitals Group     BP 07/02/20 0328 138/80     Pulse Rate 07/02/20 0328 83     Resp 07/02/20 0328 18     Temp 07/02/20 0328 98 F (36.7 C)     Temp Source 07/02/20 0328 Oral     SpO2 07/02/20 0328 99 %     Weight 07/02/20 0346 167 lb (75.8 kg)     Height 07/02/20 0346 4\' 11"  (1.499 m)     Head Circumference --      Peak Flow --      Pain Score 07/02/20 0345 10     Pain Loc --      Pain Edu? --      Excl. in GC? --      Physical Exam Vitals and nursing note reviewed.  Constitutional:      General: She is not in acute distress.    Appearance: She is well-developed.  HENT:     Head: Normocephalic and atraumatic.  Eyes:     Conjunctiva/sclera: Conjunctivae normal.  Cardiovascular:     Rate and Rhythm: Normal rate and regular rhythm.     Heart sounds: Normal heart sounds.  Pulmonary:     Effort: Pulmonary effort is normal. No respiratory distress.     Breath sounds: No wheezing.  Abdominal:     General: There is no distension.     Comments: Gravid, non-tender  Musculoskeletal:     Cervical back: Neck supple.     Comments: Minimal R thigh tenderness. No bruising. No deformity.  Skin:    General: Skin is warm.     Capillary Refill: Capillary refill takes less than 2 seconds.     Findings: No rash.  Neurological:     Mental Status: She is alert and oriented to person, place, and time.     Motor: No abnormal muscle tone.        ____________________________________________   LABS (all labs ordered are listed, but only abnormal results are displayed)  Labs Reviewed  GLUCOSE, CAPILLARY    ____________________________________________  EKG: None ________________________________________  RADIOLOGY All imaging, including plain films, CT scans, and ultrasounds, independently reviewed by me, and interpretations confirmed via formal radiology reads.  ED MD interpretation:   None  Official radiology report(s): 09/02/20 Venous Img Lower Unilateral Right  Result Date: 07/01/2020 CLINICAL DATA:  Right upper leg pain EXAM: Right LOWER EXTREMITY VENOUS DOPPLER ULTRASOUND TECHNIQUE: Gray-scale sonography with compression, as well as color and duplex ultrasound, were performed  to evaluate the deep venous system(s) from the level of the common femoral vein through the popliteal and proximal calf veins. COMPARISON:  None. FINDINGS: VENOUS Normal compressibility of the common femoral, superficial femoral, and popliteal veins, as well as the visualized calf veins. Visualized portions of profunda femoral vein and great saphenous vein unremarkable. No filling defects to suggest DVT on grayscale or color Doppler imaging. Doppler waveforms show normal direction of venous flow, normal respiratory plasticity and response to augmentation. Limited views of the contralateral common femoral vein are unremarkable. OTHER None. Limitations: none IMPRESSION: Negative. Electronically Signed   By: Jasmine Pang M.D.   On: 07/01/2020 22:29    ____________________________________________  PROCEDURES   Procedure(s) performed (including Critical Care):  Procedures  ____________________________________________  INITIAL IMPRESSION / MDM / ASSESSMENT AND PLAN / ED COURSE  As part of my medical decision making, I reviewed the following data within the electronic MEDICAL RECORD NUMBER Nursing notes reviewed and incorporated, Old chart reviewed, Notes  from prior ED visits, and [place] Controlled Substance Database       *[name] was evaluated in Emergency Department on 07/02/2020 for the symptoms described in the history of present illness. She was evaluated in the context of the global COVID-19 pandemic, which necessitated consideration that the patient might be at risk for infection with the SARS-CoV-2 virus that causes COVID-19. Institutional protocols and algorithms that pertain to the evaluation of patients at risk for COVID-19 are in a state of rapid change based on information released by regulatory bodies including the CDC and federal and state organizations. These policies and algorithms were followed during the patient's care in the ED.  Some ED evaluations and interventions may be delayed as a result of limited staffing during the pandemic.*     Medical Decision Making:  34 yo F here with R thigh pain. Pt was just seen and discharged for same. Likely MSK pain 2/2 pregnancy, possible round ligament pain. No abd pain, vaginal bleeding, LOF. No rash or skin lesions. U/S negative for DVT. Tylenol, flexeril, d/c.  ____________________________________________  FINAL CLINICAL IMPRESSION(S) / ED DIAGNOSES  Final diagnoses:  Right leg pain     MEDICATIONS GIVEN DURING THIS VISIT:  Medications  acetaminophen (TYLENOL) tablet 650 mg (650 mg Oral Given 07/02/20 0645)  cyclobenzaprine (FLEXERIL) tablet 5 mg (5 mg Oral Given 07/02/20 0645)     ED Discharge Orders         Ordered    acetaminophen (TYLENOL) 325 MG tablet  Every 6 hours PRN     Discontinue  Reprint     07/02/20 0658           Note:  This document was prepared using Dragon voice recognition software and may include unintentional dictation errors.   Shaune Pollack, MD 07/02/20 [phone]

## 2020-07-02 NOTE — ED Notes (Signed)
Spoke to Newt Lukes NP and told her pt requesting to leave. Annice Pih is okay with that stating pt does not meet inpatient criteria.

## 2020-07-02 NOTE — ED Provider Notes (Signed)
Patient cleared by Psychiatry. No SI, HI. She was just discharged from [facility]. Imaging negative.   Shaune Pollack, MD 07/02/20 [phone]

## 2020-07-02 NOTE — ED Notes (Signed)
Pt sleeping. 

## 2020-07-02 NOTE — ED Notes (Signed)
Pt given meal tray.

## 2020-07-02 NOTE — ED Triage Notes (Signed)
Pt presents to ED with continued right leg/hip pain. Pt currently [redacted] weeks pregnant and was brought to ED earlier this evening by EMS for the same. Pt was discharged in the past hour or so and pt states she checked back in because she is "still hurting". Pt also wanted her blood sugar checked because she has gestational diabetes and has not taken her insulin today.

## 2020-07-02 NOTE — ED Notes (Signed)
Patient was seen sitting in lobby and when asked was she discharged patient stated that she left. RN aware where patient is sitting. RN asked to get vitals on patient. Patient began to be rude and refused.

## 2020-07-15 ENCOUNTER — Emergency Department
Admission: EM | Admit: 2020-07-15 | Discharge: 2020-07-15 | Disposition: A | Discharge status: Home / Self Care | Payer: Medicaid Other | Attending: Emergency Medicine | Admitting: Emergency Medicine

## 2020-07-15 ENCOUNTER — Inpatient Hospital Stay
Admission: RE | Admit: 2020-07-15 | Discharge: 2020-07-17 | DRG: 832 | Disposition: A | Discharge status: Other Acute Inpatient Hospital | Payer: Medicaid Other | Source: Intra-hospital | Attending: Psychiatry | Admitting: Psychiatry

## 2020-07-15 ENCOUNTER — Encounter: Payer: Self-pay | Admitting: Emergency Medicine

## 2020-07-15 ENCOUNTER — Other Ambulatory Visit: Payer: Self-pay

## 2020-07-15 ENCOUNTER — Encounter: Payer: Self-pay | Admitting: Internal Medicine

## 2020-07-15 DIAGNOSIS — I10 Essential (primary) hypertension: Secondary | ICD-10-CM | POA: Insufficient documentation

## 2020-07-15 DIAGNOSIS — F332 Major depressive disorder, recurrent severe without psychotic features: Secondary | ICD-10-CM | POA: Insufficient documentation

## 2020-07-15 DIAGNOSIS — F1220 Cannabis dependence, uncomplicated: Secondary | ICD-10-CM | POA: Diagnosis present

## 2020-07-15 DIAGNOSIS — Z87891 Personal history of nicotine dependence: Secondary | ICD-10-CM | POA: Insufficient documentation

## 2020-07-15 DIAGNOSIS — O163 Unspecified maternal hypertension, third trimester: Secondary | ICD-10-CM | POA: Diagnosis present

## 2020-07-15 DIAGNOSIS — F09 Unspecified mental disorder due to known physiological condition: Secondary | ICD-10-CM | POA: Diagnosis present

## 2020-07-15 DIAGNOSIS — F1520 Other stimulant dependence, uncomplicated: Secondary | ICD-10-CM | POA: Diagnosis present

## 2020-07-15 DIAGNOSIS — Z915 Personal history of self-harm: Secondary | ICD-10-CM | POA: Diagnosis not present

## 2020-07-15 DIAGNOSIS — Z3A33 33 weeks gestation of pregnancy: Secondary | ICD-10-CM | POA: Diagnosis not present

## 2020-07-15 DIAGNOSIS — F10929 Alcohol use, unspecified with intoxication, unspecified: Secondary | ICD-10-CM | POA: Insufficient documentation

## 2020-07-15 DIAGNOSIS — Z91010 Allergy to peanuts: Secondary | ICD-10-CM | POA: Diagnosis not present

## 2020-07-15 DIAGNOSIS — F1020 Alcohol dependence, uncomplicated: Secondary | ICD-10-CM | POA: Diagnosis present

## 2020-07-15 DIAGNOSIS — O10913 Unspecified pre-existing hypertension complicating pregnancy, third trimester: Secondary | ICD-10-CM | POA: Diagnosis not present

## 2020-07-15 DIAGNOSIS — Z9114 Patient's other noncompliance with medication regimen: Secondary | ICD-10-CM | POA: Diagnosis not present

## 2020-07-15 DIAGNOSIS — O99323 Drug use complicating pregnancy, third trimester: Secondary | ICD-10-CM | POA: Diagnosis present

## 2020-07-15 DIAGNOSIS — F4320 Adjustment disorder, unspecified: Secondary | ICD-10-CM | POA: Diagnosis present

## 2020-07-15 DIAGNOSIS — F6081 Narcissistic personality disorder: Secondary | ICD-10-CM | POA: Diagnosis present

## 2020-07-15 DIAGNOSIS — Z833 Family history of diabetes mellitus: Secondary | ICD-10-CM

## 2020-07-15 DIAGNOSIS — R4589 Other symptoms and signs involving emotional state: Secondary | ICD-10-CM | POA: Diagnosis present

## 2020-07-15 DIAGNOSIS — Z8632 Personal history of gestational diabetes: Secondary | ICD-10-CM | POA: Diagnosis not present

## 2020-07-15 DIAGNOSIS — F411 Generalized anxiety disorder: Secondary | ICD-10-CM | POA: Diagnosis present

## 2020-07-15 DIAGNOSIS — F1590 Other stimulant use, unspecified, uncomplicated: Secondary | ICD-10-CM | POA: Diagnosis present

## 2020-07-15 DIAGNOSIS — F17210 Nicotine dependence, cigarettes, uncomplicated: Secondary | ICD-10-CM | POA: Diagnosis present

## 2020-07-15 DIAGNOSIS — R45851 Suicidal ideations: Secondary | ICD-10-CM | POA: Diagnosis not present

## 2020-07-15 DIAGNOSIS — Z794 Long term (current) use of insulin: Secondary | ICD-10-CM | POA: Diagnosis not present

## 2020-07-15 DIAGNOSIS — Z20822 Contact with and (suspected) exposure to covid-19: Secondary | ICD-10-CM | POA: Diagnosis not present

## 2020-07-15 DIAGNOSIS — F3164 Bipolar disorder, current episode mixed, severe, with psychotic features: Secondary | ICD-10-CM | POA: Diagnosis present

## 2020-07-15 DIAGNOSIS — F329 Major depressive disorder, single episode, unspecified: Secondary | ICD-10-CM | POA: Diagnosis present

## 2020-07-15 DIAGNOSIS — O99343 Other mental disorders complicating pregnancy, third trimester: Principal | ICD-10-CM | POA: Diagnosis present

## 2020-07-15 DIAGNOSIS — Z91018 Allergy to other foods: Secondary | ICD-10-CM | POA: Diagnosis not present

## 2020-07-15 DIAGNOSIS — Z79899 Other long term (current) drug therapy: Secondary | ICD-10-CM | POA: Insufficient documentation

## 2020-07-15 DIAGNOSIS — E079 Disorder of thyroid, unspecified: Secondary | ICD-10-CM | POA: Diagnosis not present

## 2020-07-15 DIAGNOSIS — F1420 Cocaine dependence, uncomplicated: Secondary | ICD-10-CM | POA: Diagnosis present

## 2020-07-15 DIAGNOSIS — Z888 Allergy status to other drugs, medicaments and biological substances status: Secondary | ICD-10-CM | POA: Diagnosis not present

## 2020-07-15 DIAGNOSIS — Z886 Allergy status to analgesic agent status: Secondary | ICD-10-CM | POA: Diagnosis not present

## 2020-07-15 DIAGNOSIS — Z3490 Encounter for supervision of normal pregnancy, unspecified, unspecified trimester: Secondary | ICD-10-CM

## 2020-07-15 DIAGNOSIS — Z3A32 32 weeks gestation of pregnancy: Secondary | ICD-10-CM | POA: Diagnosis not present

## 2020-07-15 DIAGNOSIS — F419 Anxiety disorder, unspecified: Secondary | ICD-10-CM | POA: Diagnosis not present

## 2020-07-15 DIAGNOSIS — F17200 Nicotine dependence, unspecified, uncomplicated: Secondary | ICD-10-CM | POA: Diagnosis present

## 2020-07-15 DIAGNOSIS — O99333 Smoking (tobacco) complicating pregnancy, third trimester: Secondary | ICD-10-CM | POA: Diagnosis present

## 2020-07-15 DIAGNOSIS — R109 Unspecified abdominal pain: Secondary | ICD-10-CM | POA: Diagnosis present

## 2020-07-15 DIAGNOSIS — O26893 Other specified pregnancy related conditions, third trimester: Secondary | ICD-10-CM | POA: Diagnosis present

## 2020-07-15 DIAGNOSIS — Z9119 Patient's noncompliance with other medical treatment and regimen: Secondary | ICD-10-CM

## 2020-07-15 DIAGNOSIS — O99283 Endocrine, nutritional and metabolic diseases complicating pregnancy, third trimester: Secondary | ICD-10-CM | POA: Diagnosis not present

## 2020-07-15 DIAGNOSIS — O24419 Gestational diabetes mellitus in pregnancy, unspecified control: Secondary | ICD-10-CM | POA: Diagnosis not present

## 2020-07-15 LAB — COMPREHENSIVE METABOLIC PANEL
ALT: 12 U/L (ref 0–44)
AST: 13 U/L — ABNORMAL LOW (ref 15–41)
Albumin: 3.3 g/dL — ABNORMAL LOW (ref 3.5–5.0)
Alkaline Phosphatase: 90 U/L (ref 38–126)
Anion gap: 11 (ref 5–15)
BUN: 5 mg/dL — ABNORMAL LOW (ref 6–20)
CO2: 20 mmol/L — ABNORMAL LOW (ref 22–32)
Calcium: 8.2 mg/dL — ABNORMAL LOW (ref 8.9–10.3)
Chloride: 106 mmol/L (ref 98–111)
Creatinine, Ser: 0.59 mg/dL (ref 0.44–1.00)
GFR calc Af Amer: >60 mL/min (ref >60)
GFR calc non Af Amer: >60 mL/min (ref >60)
Glucose, Bld: 86 mg/dL (ref 70–99)
Potassium: 3.1 mmol/L — ABNORMAL LOW (ref 3.5–5.1)
Sodium: 137 mmol/L (ref 135–145)
Total Bilirubin: 0.6 mg/dL (ref 0.3–1.2)
Total Protein: 7 g/dL (ref 6.5–8.1)

## 2020-07-15 LAB — ETHANOL: Alcohol, Ethyl (B): 131 mg/dL — ABNORMAL HIGH (ref <10)

## 2020-07-15 LAB — GLUCOSE, CAPILLARY: Glucose-Capillary: 93 mg/dL (ref 70–99)

## 2020-07-15 LAB — URINE DRUG SCREEN, QUALITATIVE (ARMC ONLY)
Amphetamines, Ur Screen: POSITIVE — AB
Barbiturates, Ur Screen: NOT DETECTED
Benzodiazepine, Ur Scrn: NOT DETECTED
Cannabinoid 50 Ng, Ur ~~LOC~~: POSITIVE — AB
Cocaine Metabolite,Ur ~~LOC~~: POSITIVE — AB
MDMA (Ecstasy)Ur Screen: NOT DETECTED
Methadone Scn, Ur: NOT DETECTED
Opiate, Ur Screen: NOT DETECTED
Phencyclidine (PCP) Ur S: NOT DETECTED
Tricyclic, Ur Screen: NOT DETECTED

## 2020-07-15 LAB — CBC
HCT: 30.5 % — ABNORMAL LOW (ref 36.0–46.0)
Hemoglobin: 10.1 g/dL — ABNORMAL LOW (ref 12.0–15.0)
MCH: 30 pg (ref 26.0–34.0)
MCHC: 33.1 g/dL (ref 30.0–36.0)
MCV: 90.5 fL (ref 80.0–100.0)
Platelets: 182 10*3/uL (ref 150–400)
RBC: 3.37 MIL/uL — ABNORMAL LOW (ref 3.87–5.11)
RDW: 13.8 % (ref 11.5–15.5)
WBC: 10 10*3/uL (ref 4.0–10.5)
nRBC: 0 % (ref 0.0–0.2)

## 2020-07-15 LAB — ACETAMINOPHEN LEVEL: Acetaminophen (Tylenol), Serum: <10 ug/mL — ABNORMAL LOW (ref 10–30)

## 2020-07-15 LAB — SALICYLATE LEVEL: Salicylate Lvl: <7 mg/dL — ABNORMAL LOW (ref 7.0–30.0)

## 2020-07-15 LAB — SARS CORONAVIRUS 2 BY RT PCR (HOSPITAL ORDER, PERFORMED IN ~~LOC~~ HOSPITAL LAB): SARS Coronavirus 2: NEGATIVE

## 2020-07-15 MED ORDER — HYDROCORTISONE ACETATE 25 MG RE SUPP
25.0000 mg | Freq: Two times a day (BID) | RECTAL | Status: DC | PRN
  Filled 2020-07-15 (×2): qty 1

## 2020-07-15 MED ORDER — PRENATAL MULTIVITAMIN CH
1.0000 | ORAL_TABLET | Freq: Every day | ORAL | Status: DC
  Administered 2020-07-15 – 2020-07-17 (×3): 1 via ORAL
  Filled 2020-07-15 (×3): qty 1

## 2020-07-15 MED ORDER — WITCH HAZEL-GLYCERIN EX PADS
MEDICATED_PAD | CUTANEOUS | Status: DC | PRN
  Filled 2020-07-15 (×2): qty 100

## 2020-07-15 MED ORDER — ALUM & MAG HYDROXIDE-SIMETH 200-200-20 MG/5ML PO SUSP: 30.0000 mL | ORAL | Status: DC | PRN

## 2020-07-15 MED ORDER — MAGNESIUM HYDROXIDE 400 MG/5ML PO SUSP: 30.0000 mL | Freq: Every day | ORAL | Status: DC | PRN

## 2020-07-15 MED ORDER — WITCH HAZEL-GLYCERIN EX PADS
MEDICATED_PAD | CUTANEOUS | Status: DC | PRN
  Filled 2020-07-15: qty 100

## 2020-07-15 MED ORDER — DIPHENHYDRAMINE HCL 25 MG PO CAPS
50.0000 mg | ORAL_CAPSULE | Freq: Every evening | ORAL | Status: DC | PRN
  Administered 2020-07-16: 50 mg via ORAL
  Filled 2020-07-15: qty 2

## 2020-07-15 MED ORDER — HYDROCORTISONE ACETATE 25 MG RE SUPP
25.0000 mg | Freq: Two times a day (BID) | RECTAL | Status: DC | PRN
  Filled 2020-07-15: qty 1

## 2020-07-15 MED ORDER — ACETAMINOPHEN 325 MG PO TABS
650.0000 mg | ORAL_TABLET | Freq: Four times a day (QID) | ORAL | Status: DC | PRN
  Administered 2020-07-15 – 2020-07-17 (×5): 650 mg via ORAL
  Filled 2020-07-15 (×5): qty 2

## 2020-07-15 NOTE — ED Notes (Signed)
Removed spoon,containers, and all other items from her room. Pt is resting in bed watching TV.

## 2020-07-15 NOTE — ED Notes (Signed)
Call to place pt on Fetal monitor by EDP per Dr Valentino Saxon. Pt is approx 32 weeks and being seen for SI and alcohol intoxication. Pt is cooperative but sleepy.

## 2020-07-15 NOTE — ED Notes (Signed)
Patients belongings include a strapless dress, sandals, and a cell phone.

## 2020-07-15 NOTE — ED Triage Notes (Addendum)
Pt arrived via [facility] under IVC. Per affidavit, pt called [facility] in distress, stating she was going to hurt herself. Pt in triage sts, "I am just tired. I dont want to be here anymore. Im so tired." Pt reports she has no support and just drinks when she starts to feel sad and depressed. Pt taken out of forensic restraints. Pt remains calm in triage room with this RN. Pt able to answer all questions. Pt denies HI but having SI. Pt currently [redacted] weeks pregnant. Pt heavily intoxicated but denies substance abuse.

## 2020-07-15 NOTE — ED Notes (Signed)
EDP in to speak with patient regarding hemorrhoids. Will order stool softener and encouraged continued use of tucks pads and suppository as tolerated. Patient wishing to take hot shower so provided with supplies.

## 2020-07-15 NOTE — BH Assessment (Signed)
Patient is to be admitted to [facility] by Psychiatric Nurse Practitioner Nanine Means.  Attending Physician will be Dr. Smith Robert.   Patient has been assigned to room 309, by [facility] Charge Nurse Gigi.   ER staff is aware of the admission:  Drinda Butts, ER Secretary    Dr. Scotty Court, ER MD   Durene Cal, Patient's Nurse   Ethelene Browns, Patient Access.

## 2020-07-15 NOTE — Consult Note (Signed)
[facility] Face-to-Face Psychiatry Consult   Reason for Consult: Mental Health Problem Referring Physician: Dr. Don Perking Patient Identification: [name] MRN:  [id number] Principal Diagnosis: <principal problem not specified> Diagnosis:  Active Problems:   MDD (major depressive disorder)   Tobacco use disorder   Stimulant use disorder (HCC) (cocaine)   Alcohol use disorder, moderate, dependence (HCC)   Pregnant   Cocaine use disorder, moderate, dependence (HCC)   Cannabis use disorder, moderate, dependence (HCC)   Thoughts of self harm   MDD (major depressive disorder), recurrent episode, severe (HCC)   Bipolar disorder, curr episode mixed, severe, with psychotic features (HCC)   Severe recurrent major depression without psychotic features (HCC)   Total Time spent with patient: 20 minutes  Subjective:  "I am here because I am sad and no one is willing to help me " [name] is a 34 y.o. female patient presented to [facility] ED via law enforcement under involuntary commitment status (IVC).  The patient is [redacted] weeks pregnant.  The patient has had several ER visits during the past couple of months.  Per the ED triage nurse note, Per the affidavit, the patient called [facility] in distress, stating she would hurt herself. The patient in triage sts, "I am just tired. I don't want to be here anymore. I'm so tired." the patient reports she has no support and just drinks when she starts to feel sad and depressed--the patient taken out of forensic restraints. The patient denies HI but having SI. The patient is heavily intoxicated but denies substance abuse. The patient was seen face-to-face by this provider; the chart was reviewed and consulted with Dr. Don Perking on 07/15/2020 due to the patient's care. It was discussed with the EDP that the patient remained under observation overnight and will be reassessed in the a.m. to determine if he meets the criteria for psychiatric inpatient admission; she could  be discharged back home. The patient remains somewhat intoxicated with BAL 131 mg/dl, along with her being positive for Amphetamines, Cocaine, and Cannabinoid. The patient is challenging to assess. The patient is alert and oriented x 4, angry, uncooperative, and mood-congruent with affect evaluation. The patient does not appear to be responding to internal or external stimuli. The patient is not presenting with delusional thinking. The patient denies auditory or visual hallucinations. The patient denies suicidal, self-harm, and homicidal ideations. The patient is not presenting with any psychotic or paranoid behaviors. During an encounter with the patient, she was able to answer questions appropriately. The patient voiced that she is sad and does not like her place in her life. She states she is not getting any help. She expressed that she needs help. The patient was asked how is the baby doing?  She states, "I do not care about no baby."  "I need help, and nobody is willing to help me."  Plan: The patient remained under observation overnight and will be reassessed in the a.m. to determine if he meets the criteria for psychiatric inpatient admission; she could be discharged back home.  HPI:  Per Dr. Don Perking: [name] is a 34 y.o. female with a history of depression, substance abuse, bipolar disorder, suicide attempt who was brought in under IVC for suicidal thoughts.  Patient is crying very upset.  Positive EtOH.  Does not provide much history.  Everything that I asked for she responds "I do not care."  I asked patient to talk to me she said "what for?"  According to the IVC papers  she called [facility] reporting that she was going to hurt herself and police responded and brought her to the emergency room.  Patient is currently [redacted] weeks pregnant.  She reports "some prenatal care at [facility]."  Luckily patient allowed me to do a bedside ultrasound to evaluate fetal health.  She denies having any contractions,  any loss of fluid, any vaginal bleeding. Past Psychiatric History:  Anxiety Depression History of substance abuse (HCC) History of suicide attempt  Risk to Self:  No Risk to Others:  No Prior Inpatient Therapy:  Yes Prior Outpatient Therapy:  Yes  Past Medical History:  Past Medical History:  Diagnosis Date  . Anxiety   . Depression   . Gestational diabetes   . History of substance abuse (HCC)   . History of suicide attempt   . History of thyroid disease   . Hypertension   . Thyroid disease     Past Surgical History:  Procedure Laterality Date  . CESAREAN SECTION  2000, 2002, 2013  . WISDOM TOOTH EXTRACTION     Family History:  Family History  Problem Relation Age of Onset  . Diabetes Mother    Family Psychiatric  History:  Social History:  Social History   Substance and Sexual Activity  Alcohol Use Not Currently   Comment: social drinker     Social History   Substance and Sexual Activity  Drug Use Not Currently  . Types: Cocaine, Marijuana   Comment: Positive UDS 12/2017    Social History   Socioeconomic History  . Marital status: Single    Spouse name: Not on file  . Number of children: Not on file  . Years of education: Not on file  . Highest education level: Not on file  Occupational History  . Not on file  Tobacco Use  . Smoking status: Former Smoker    Packs/day: 0.50  . Smokeless tobacco: Never Used  Substance and Sexual Activity  . Alcohol use: Not Currently    Comment: social drinker  . Drug use: Not Currently    Types: Cocaine, Marijuana    Comment: Positive UDS 12/2017  . Sexual activity: Not Currently    Birth control/protection: None  Other Topics Concern  . Not on file  Social History Narrative  . Not on file   Social Determinants of Health   Financial Resource Strain:   . Difficulty of Paying Living Expenses:   Food Insecurity:   . Worried About Programme researcher, broadcasting/film/video in the Last Year:   . Barista in the Last Year:    Transportation Needs:   . Freight forwarder (Medical):   Marland Kitchen Lack of Transportation (Non-Medical):   Physical Activity:   . Days of Exercise per Week:   . Minutes of Exercise per Session:   Stress:   . Feeling of Stress :   Social Connections:   . Frequency of Communication with Friends and Family:   . Frequency of Social Gatherings with Friends and Family:   . Attends Religious Services:   . Active Member of Clubs or Organizations:   . Attends Banker Meetings:   Marland Kitchen Marital Status:    Additional Social History:    Allergies:   Allergies  Allergen Reactions  . Aspirin Anaphylaxis, Diarrhea and Nausea And Vomiting  . Banana Anaphylaxis, Itching, Shortness Of Breath and Swelling  . Other Itching, Shortness Of Breath and Swelling  . Peanut Oil Itching, Shortness Of Breath and Swelling  . Peanut-Containing  Drug Products Shortness Of Breath, Itching and Swelling  . Pecan Nut (Diagnostic) Anaphylaxis    Labs:  Results for orders placed or performed during the hospital encounter of 07/15/20 (from the past 48 hour(s))  Comprehensive metabolic panel     Status: Abnormal   Collection Time: 07/15/20 12:13 AM  Result Value Ref Range   Sodium 137 135 - 145 mmol/L   Potassium 3.1 (L) 3.5 - 5.1 mmol/L   Chloride 106 98 - 111 mmol/L   CO2 20 (L) 22 - 32 mmol/L   Glucose, Bld 86 70 - 99 mg/dL    Comment: Glucose reference range applies only to samples taken after fasting for at least 8 hours.   BUN 5 (L) 6 - 20 mg/dL   Creatinine, Ser 1.61 0.44 - 1.00 mg/dL   Calcium 8.2 (L) 8.9 - 10.3 mg/dL   Total Protein 7.0 6.5 - 8.1 g/dL   Albumin 3.3 (L) 3.5 - 5.0 g/dL   AST 13 (L) 15 - 41 U/L   ALT 12 0 - 44 U/L   Alkaline Phosphatase 90 38 - 126 U/L   Total Bilirubin 0.6 0.3 - 1.2 mg/dL   GFR calc non Af Amer >60 >60 mL/min   GFR calc Af Amer >60 >60 mL/min   Anion gap 11 5 - 15    Comment: Performed at [facility], [address], [place], [place] [address]   Ethanol     Status: Abnormal   Collection Time: 07/15/20 12:13 AM  Result Value Ref Range   Alcohol, Ethyl (B) 131 (H) <10 mg/dL    Comment: (NOTE) Lowest detectable limit for serum alcohol is 10 mg/dL.  For medical purposes only. Performed at [facility], [address] [place], [place] [address]   Salicylate level     Status: Abnormal   Collection Time: 07/15/20 12:13 AM  Result Value Ref Range   Salicylate Lvl <7.0 (L) 7.0 - 30.0 mg/dL    Comment: Performed at [facility], [address] [place], [place] [address]  Acetaminophen level     Status: Abnormal   Collection Time: 07/15/20 12:13 AM  Result Value Ref Range   Acetaminophen (Tylenol), Serum <10 (L) 10 - 30 ug/mL    Comment: (NOTE) Therapeutic concentrations vary significantly. A range of 10-30 ug/mL  may be an effective concentration for many patients. However, some  are best treated at concentrations outside of this range. Acetaminophen concentrations >150 ug/mL at 4 hours after ingestion  and >50 ug/mL at 12 hours after ingestion are often associated with  toxic reactions.  Performed at [facility], [address] [place], [place] [address]   cbc     Status: Abnormal   Collection Time: 07/15/20 12:13 AM  Result Value Ref Range   WBC 10.0 4.0 - 10.5 K/uL   RBC 3.37 (L) 3.87 - 5.11 MIL/uL   Hemoglobin 10.1 (L) 12.0 - 15.0 g/dL   HCT 62.1 (L) 36 - 46 %   MCV 90.5 80.0 - 100.0 fL   MCH 30.0 26.0 - 34.0 pg   MCHC 33.1 30.0 - 36.0 g/dL   RDW 30.8 65.7 - 84.6 %   Platelets 182 150 - 400 K/uL   nRBC 0.0 0.0 - 0.2 %    Comment: Performed at [facility], [address], [place], [place] [address]  Urine Drug Screen, Qualitative     Status: Abnormal   Collection Time: 07/15/20 12:13 AM  Result Value Ref Range  Tricyclic, Ur Screen NONE DETECTED NONE DETECTED   Amphetamines, Ur Screen POSITIVE (A) NONE DETECTED   MDMA (Ecstasy)Ur Screen NONE DETECTED NONE DETECTED    Cocaine Metabolite,Ur [place] POSITIVE (A) NONE DETECTED   Opiate, Ur Screen NONE DETECTED NONE DETECTED   Phencyclidine (PCP) Ur S NONE DETECTED NONE DETECTED   Cannabinoid 50 Ng, Ur [place] POSITIVE (A) NONE DETECTED   Barbiturates, Ur Screen NONE DETECTED NONE DETECTED   Benzodiazepine, Ur Scrn NONE DETECTED NONE DETECTED   Methadone Scn, Ur NONE DETECTED NONE DETECTED    Comment: (NOTE) Tricyclics + metabolites, urine    Cutoff 1000 ng/mL Amphetamines + metabolites, urine  Cutoff 1000 ng/mL MDMA (Ecstasy), urine              Cutoff 500 ng/mL Cocaine Metabolite, urine          Cutoff 300 ng/mL Opiate + metabolites, urine        Cutoff 300 ng/mL Phencyclidine (PCP), urine         Cutoff 25 ng/mL Cannabinoid, urine                 Cutoff 50 ng/mL Barbiturates + metabolites, urine  Cutoff 200 ng/mL Benzodiazepine, urine              Cutoff 200 ng/mL Methadone, urine                   Cutoff 300 ng/mL  The urine drug screen provides only a preliminary, unconfirmed analytical test result and should not be used for non-medical purposes. Clinical consideration and professional judgment should be applied to any positive drug screen result due to possible interfering substances. A more specific alternate chemical method must be used in order to obtain a confirmed analytical result. Gas chromatography / mass spectrometry (GC/MS) is the preferred confirm atory method. Performed at [facility], [address] [place], [place] [address]   SARS Coronavirus 2 by RT PCR (hospital order, performed in [facility] hospital lab) Nasopharyngeal Nasopharyngeal Swab     Status: None   Collection Time: 07/15/20  2:31 AM   Specimen: Nasopharyngeal Swab  Result Value Ref Range   SARS Coronavirus 2 NEGATIVE NEGATIVE    Comment: (NOTE) SARS-CoV-2 target nucleic acids are NOT DETECTED.  The SARS-CoV-2 RNA is generally detectable in upper and lower respiratory specimens during the acute phase of  infection. The lowest concentration of SARS-CoV-2 viral copies this assay can detect is 250 copies / mL. A negative result does not preclude SARS-CoV-2 infection and should not be used as the sole basis for treatment or other patient management decisions.  A negative result may occur with improper specimen collection / handling, submission of specimen other than nasopharyngeal swab, presence of viral mutation(s) within the areas targeted by this assay, and inadequate number of viral copies (<250 copies / mL). A negative result must be combined with clinical observations, patient history, and epidemiological information.  Fact Sheet for Patients:   BoilerBrush.com.cy  Fact Sheet for Healthcare Providers: https://pope.com/  This test is not yet approved or  cleared by the [place] FDA and has been authorized for detection and/or diagnosis of SARS-CoV-2 by FDA under an Emergency Use Authorization (EUA).  This EUA will remain in effect (meaning this test can be used) for the duration of the COVID-19 declaration under Section 564(b)(1) of the Act, 21 U.S.C. section 360bbb-3(b)(1), unless the authorization is terminated or revoked sooner.  Performed at [facility], [address]  [place], [place] [id number]     No current facility-administered medications for this encounter.   Current Outpatient Medications  Medication Sig Dispense Refill  . acetaminophen (TYLENOL) 325 MG tablet Take 2 tablets (650 mg total) by mouth every 6 (six) hours as needed for moderate pain. 30 tablet 0  . diphenhydrAMINE (BENADRYL) 50 MG capsule Take 1 capsule (50 mg total) by mouth at bedtime as needed (PRN for sleep per MD orders). 30 capsule 1  . docusate sodium (COLACE) 100 MG capsule Take 2 capsules (200 mg total) by mouth 2 (two) times daily. 120 capsule 1  . hydrocortisone cream 1 % Apply topically 2 (two) times daily. To affected area 30 g 1   . metroNIDAZOLE (FLAGYL) 500 MG tablet Take 1 tablet (500 mg total) by mouth every 12 (twelve) hours. 14 tablet 0  . polyethylene glycol (MIRALAX / GLYCOLAX) 17 g packet Take 17 g by mouth daily. 30 each 1  . sertraline (ZOLOFT) 25 MG tablet Take 3 tablets (75 mg total) by mouth daily. 90 tablet 1    Musculoskeletal: Strength & Muscle Tone: within normal limits Gait & Station: normal Patient leans: N/A  Psychiatric Specialty Exam: Physical Exam Vitals and nursing note reviewed.  Cardiovascular:     Rate and Rhythm: Normal rate.  Pulmonary:     Effort: Pulmonary effort is normal.  Musculoskeletal:        General: Normal range of motion.     Cervical back: Normal range of motion and neck supple.  Neurological:     Mental Status: She is alert and oriented to person, place, and time.  Psychiatric:        Attention and Perception: Attention and perception normal.        Mood and Affect: Affect is flat and angry.        Speech: Speech normal.        Behavior: Behavior is uncooperative and aggressive.        Thought Content: Thought content normal.        Cognition and Memory: Cognition normal.        Judgment: Judgment normal.     Review of Systems  Psychiatric/Behavioral: Positive for agitation. The patient is nervous/anxious.   All other systems reviewed and are negative.   Blood pressure (!) 161/73, pulse 95, temperature 98.1 F (36.7 C), temperature source Oral, resp. rate 18, last menstrual period 12/04/2019, SpO2 99 %.There is no height or weight on file to calculate BMI.  General Appearance: Disheveled  Eye Contact:  Absent  Speech:  Clear and Coherent  Volume:  Decreased  Mood:  Irritable  Affect:  Appropriate  Thought Process:  Coherent  Orientation:  Full (Time, Place, and Person)  Thought Content:  WDL  Suicidal Thoughts:  No  Homicidal Thoughts:  No  Memory:  Immediate;   Fair Recent;   Fair Remote;   Fair  Judgement:  Impaired  Insight:  Lacking   Psychomotor Activity:  Normal  Concentration:  Concentration: Fair and Attention Span: Fair  Recall:  FiservFair  Fund of Knowledge:  Fair  Language:  Fair  Akathisia:  Negative  Handed:  Right  AIMS (if indicated):     Assets:  Communication Skills Desire for Improvement Financial Resources/Insurance Physical Health Resilience Social Support  ADL's:  Intact  Cognition:  WNL  Sleep:   Okay     Treatment Plan Summary: Plan The patient remained under observation overnight and will be reassessed in the a.m. to determine if  he meets the criteria for psychiatric inpatient admission; he could be discharged back home.  Disposition: Supportive therapy provided about ongoing stressors. The patient remained under observation overnight and will be reassessed in the a.m. to determine if he meets the criteria for psychiatric inpatient admission; he could be discharged back home.  Gillermo Murdoch, NP 07/15/2020 5:13 AM

## 2020-07-15 NOTE — ED Notes (Signed)
Patient complaining about painful hemorrhoids. MD made aware. Awaiting new orders.

## 2020-07-15 NOTE — ED Notes (Signed)
NST FHR 130Cat 1, reassuring and reactive. No Contractions noted

## 2020-07-15 NOTE — ED Notes (Signed)
Pt says the wipes are not helping with hemorrhoids. The pain is getting worse. Reported to [employer].

## 2020-07-15 NOTE — ED Notes (Signed)
Gave pt breakfast tray with juice. Pt refused to eat her food because she does not like eggs. RN Vernona Rieger put in a different food diet for her. At this time RN Vernona Rieger allowed her to have cereal, milk, and a spoon to eat her breakfast while being monitored. After she is finished all items will be removed from room.

## 2020-07-15 NOTE — ED Provider Notes (Signed)
Procedures     ----------------------------------------- 10:02 AM on 07/15/2020 ----------------------------------------- Advised by psychiatrythey plan to admit here at [facility].     Sharman Cheek, MD 07/15/20 1002

## 2020-07-15 NOTE — ED Notes (Signed)
Report to include Situation, Background, Assessment, and Recommendations received from RN. Patient alert and oriented, warm and dry, in no acute distress. Patient denies SI, HI, AVH and pain. Patient made aware of Q15 minute rounds and security cameras for their safety. Patient instructed to come to me with needs or concerns.  

## 2020-07-15 NOTE — BH Assessment (Signed)
Assessment Note  [name] is an 34 y.o. female who presents to the ER due to having increase thoughts of ending her life. She states the thoughts have been ongoing but lately they have increased. She has no reason to live and no motivation to try to find things that are positive in her life, to live for. Patient is well known to the ER for similar presentation. She states she has the plan of either overdosing on medications or drugs.  During the interview, the patient was withdrawn and guarded. She provided appropriate answers to the questions.  When answering, majority of the time she would preface the answer with statements of how the staff at the hospital didn't care about her. Client admits to the use of mind-altering substances; cocaine, alcohol, methamphetamine and cannabis. She denies HI and AV/H.  Patient is [redacted] weeks pregnant  Diagnosis: Major Depression  Past Medical History:  Past Medical History:  Diagnosis Date  . Anxiety   . Depression   . Gestational diabetes   . History of substance abuse (HCC)   . History of suicide attempt   . History of thyroid disease   . Hypertension   . Thyroid disease     Past Surgical History:  Procedure Laterality Date  . CESAREAN SECTION  2000, 2002, 2013  . WISDOM TOOTH EXTRACTION      Family History:  Family History  Problem Relation Age of Onset  . Diabetes Mother     Social History:  reports that she has quit smoking. She smoked 0.50 packs per day. She has never used smokeless tobacco. She reports previous alcohol use. She reports previous drug use. Drugs: Cocaine and Marijuana.  Additional Social History:  Alcohol / Drug Use Pain Medications: See PTA Prescriptions: See PTA Over the Counter: See PTA History of alcohol / drug use?: Yes Longest period of sobriety (when/how long): Unable to quantify Substance #1 Name of Substance 1: Cocaine Substance #2 Name of Substance 2: "Meth" Substance #3 Name of Substance 3:  Alcohol Substance #4 Name of Substance 4: Cannabis  CIWA: CIWA-Ar BP: (!) 161/73 Pulse Rate: 95 COWS:    Allergies:  Allergies  Allergen Reactions  . Aspirin Anaphylaxis, Diarrhea and Nausea And Vomiting  . Banana Anaphylaxis, Itching, Shortness Of Breath and Swelling  . Other Itching, Shortness Of Breath and Swelling  . Peanut Oil Itching, Shortness Of Breath and Swelling  . Peanut-Containing Drug Products Shortness Of Breath, Itching and Swelling  . Pecan Nut (Diagnostic) Anaphylaxis  . Egg [Eggs Or Egg-Derived Products] Nausea Only    Home Medications: (Not in a hospital admission)   OB/GYN Status:  Patient's last menstrual period was 12/04/2019 (within months).  General Assessment Data Location of Assessment: [facility] ED TTS Assessment: In system Is this a Tele or Face-to-Face Assessment?: Face-to-Face Is this an Initial Assessment or a Re-assessment for this encounter?: Initial Assessment Patient Accompanied by:: N/A Language Other than English: No Living Arrangements: Homeless/Shelter What gender do you identify as?: Female Date Telepsych consult ordered in [facility]: 07/15/20 Time Telepsych consult ordered in [facility]: 0101 Marital status: Single Pregnancy Status: Yes (Comment: include estimated delivery date) Living Arrangements: Other (Comment) (Homeless) Can pt return to current living arrangement?: Yes Admission Status: Involuntary Petitioner: ED Attending Is patient capable of signing voluntary admission?: No (Under IVC) Referral Source: Self/Family/Friend Insurance type: Medicaid  Medical Screening Exam [facility] Walk-in ONLY) Medical Exam completed: Yes  Crisis Care Plan Living Arrangements: Other (Comment) (Homeless) Legal Guardian: Other: (Self) Name  of Psychiatrist: None Name of Therapist: None  Education Status Is patient currently in school?: No Is the patient employed, unemployed or receiving disability?: Unemployed  Risk to self with the past 6  months Suicidal Ideation: Yes-Currently Present Has patient been a risk to self within the past 6 months prior to admission? : No Suicidal Intent: No-Not Currently/Within Last 6 Months Has patient had any suicidal intent within the past 6 months prior to admission? : No Is patient at risk for suicide?: Yes Suicidal Plan?: Yes-Currently Present Has patient had any suicidal plan within the past 6 months prior to admission? : Yes Specify Current Suicidal Plan: Overdose on medications Access to Means: Yes Specify Access to Suicidal Means: Medication overdose What has been your use of drugs/alcohol within the last 12 months?: Cocaine, Meth, alcohol & Cannabis Previous Attempts/Gestures: Yes How many times?: 1 Other Self Harm Risks: Active drug use Triggers for Past Attempts: Other (Comment) Intentional Self Injurious Behavior: None Comment - Self Injurious Behavior: Active drug use Family Suicide History: Unknown Recent stressful life event(s): Other (Comment), Conflict (Comment), Loss (Comment), Financial Problems, Recent negative physical changes, Trauma (Comment) Persecutory voices/beliefs?: No Depression: Yes Depression Symptoms: Tearfulness, Isolating, Fatigue, Guilt, Loss of interest in usual pleasures, Feeling worthless/self pity Substance abuse history and/or treatment for substance abuse?: Yes Suicide prevention information given to non-admitted patients: Not applicable  Risk to Others within the past 6 months Homicidal Ideation: No Does patient have any lifetime risk of violence toward others beyond the six months prior to admission? : No Thoughts of Harm to Others: No Current Homicidal Intent: No Current Homicidal Plan: No Access to Homicidal Means: No Identified Victim: Reports of none History of harm to others?: No Assessment of Violence: None Noted Violent Behavior Description: Reports of none Does patient have access to weapons?: No Criminal Charges Pending?: No Does  patient have a court date: No Is patient on probation?: No  Psychosis Hallucinations: None noted Delusions: None noted  Mental Status Report Appearance/Hygiene: Unremarkable, In scrubs Eye Contact: Fair Motor Activity: Freedom of movement, Unremarkable Speech: Logical/coherent, Unremarkable Level of Consciousness: Alert Mood: Depressed, Anxious, Pleasant, Helpless, Guilty Affect: Appropriate to circumstance, Depressed, Anxious, Sad Anxiety Level: Minimal Thought Processes: Coherent, Relevant Judgement: Unimpaired Orientation: Person, Place, Time, Situation, Appropriate for developmental age Obsessive Compulsive Thoughts/Behaviors: Minimal  Cognitive Functioning Concentration: Normal Memory: Recent Intact, Remote Intact Is patient IDD: No Insight: Fair Impulse Control: Fair Appetite: Good Have you had any weight changes? : No Change Sleep: Decreased Total Hours of Sleep: 4 Vegetative Symptoms: None  ADLScreening [facility] Assessment Services) Patient's cognitive ability adequate to safely complete daily activities?: Yes Patient able to express need for assistance with ADLs?: Yes Independently performs ADLs?: Yes (appropriate for developmental age)  Prior Inpatient Therapy Prior Inpatient Therapy: Yes Prior Therapy Dates: Multiple Hospitalizations Prior Therapy Facilty/Provider(s): [facility], [facility] Reason for Treatment: Depression and  anxiety  Prior Outpatient Therapy Prior Outpatient Therapy: No Does patient have an ACCT team?: No Does patient have Intensive In-House Services?  : No Does patient have [facility] services? : No Does patient have P4CC services?: No  ADL Screening (condition at time of admission) Patient's cognitive ability adequate to safely complete daily activities?: Yes Is the patient deaf or have difficulty hearing?: No Does the patient have difficulty seeing, even when wearing glasses/contacts?: No Does the patient have difficulty  concentrating, remembering, or making decisions?: No Patient able to express need for assistance with ADLs?: Yes Does the patient have difficulty  dressing or bathing?: No Independently performs ADLs?: Yes (appropriate for developmental age) Does the patient have difficulty walking or climbing stairs?: No Weakness of Legs: None Weakness of Arms/Hands: None  Home Assistive Devices/Equipment Home Assistive Devices/Equipment: None  Therapy Consults (therapy consults require a physician order) PT Evaluation Needed: No OT Evalulation Needed: No SLP Evaluation Needed: No Abuse/Neglect Assessment (Assessment to be complete while patient is alone) Abuse/Neglect Assessment Can Be Completed: Yes Physical Abuse: Denies Verbal Abuse: Denies Sexual Abuse: Denies Exploitation of patient/patient's resources: Denies Self-Neglect: Denies Values / Beliefs Cultural Requests During Hospitalization: None Spiritual Requests During Hospitalization: None Consults Spiritual Care Consult Needed: No Transition of Care Team Consult Needed: No  Disposition:  Disposition Initial Assessment Completed for this Encounter: Yes  On Site Evaluation by:   Reviewed with Physician:    Lilyan Gilford MS, LCAS, [facility], NCC Therapeutic Triage Specialist 07/15/2020 2:11 PM

## 2020-07-15 NOTE — Tx Team (Signed)
Initial Treatment Plan 07/15/2020 5:57 PM [name] [id number]    PATIENT STRESSORS: Financial difficulties Marital or family conflict Medication change or noncompliance Substance abuse   PATIENT STRENGTHS: Ability for insight Communication skills   PATIENT IDENTIFIED PROBLEMS: Substance abuse   depression  Anxiety                  DISCHARGE CRITERIA:  Improved stabilization in mood, thinking, and/or behavior Motivation to continue treatment in a less acute level of care Verbal commitment to aftercare and medication compliance Withdrawal symptoms are absent or subacute and managed without 24-hour nursing intervention  PRELIMINARY DISCHARGE PLAN: Outpatient therapy Placement in alternative living arrangements  PATIENT/FAMILY INVOLVEMENT: This treatment plan has been presented to and reviewed with the patient, [name]  The patient has been given the opportunity to ask questions and make suggestions.  Chalmers Cater, RN 07/15/2020, 5:57 PM

## 2020-07-15 NOTE — ED Provider Notes (Signed)
[facility] Emergency Department Provider Note  ____________________________________________  Time seen: Approximately 1:00 AM  I have reviewed the triage vital signs and the nursing notes.   HISTORY  Chief Complaint Mental Health Problem  Level 5 caveat:  Portions of the history and physical were unable to be obtained due to intoxication and refusla to provide history   HPI [name] is a 34 y.o. female with a history of depression, substance abuse, bipolar disorder, suicide attempt who was brought in under IVC for suicidal thoughts.  Patient is crying very upset.  Positive EtOH.  Does not provide much history.  Everything that I asked for she responds "I do not care."  I asked patient to talk to me she said "what for?"   According to the IVC papers she called [facility] reporting that she was going to hurt herself and police responded and brought her to the emergency room.  Patient is currently [redacted] weeks pregnant.  She reports "some prenatal care at [facility]."  Luckily patient allowed me to do a bedside ultrasound to evaluate fetal health.  She denies having any contractions, any loss of fluid, any vaginal bleeding.  Past Medical History:  Diagnosis Date   Anxiety    Depression    Gestational diabetes    History of substance abuse (HCC)    History of suicide attempt    History of thyroid disease    Hypertension    Thyroid disease     Patient Active Problem List   Diagnosis Date Noted   Cramping complicating pregnancy, antepartum 06/14/2020   History of preterm labor, current pregnancy, second trimester    Severe recurrent major depression without psychotic features (HCC) 06/03/2020   Fall 05/28/2020   History of cesarean delivery 05/28/2020   Bipolar disorder, curr episode mixed, severe, with psychotic features (HCC)    MDD (major depressive disorder), recurrent episode, severe (HCC) 04/08/2020   Thoughts of self harm 02/01/2020    Abdominal pain 03/20/2018   Cocaine use disorder, moderate, dependence (HCC) 01/01/2018   Cannabis use disorder, moderate, dependence (HCC) 01/01/2018   Pregnant 12/31/2017   Medically noncompliant 12/31/2017   Tobacco use disorder 01/12/2016   Stimulant use disorder (HCC) (cocaine) 01/12/2016   Alcohol use disorder, moderate, dependence (HCC) 01/12/2016   Self-inflicted laceration of wrist (HCC) 01/11/2016   MDD (major depressive disorder) 01/11/2016    Past Surgical History:  Procedure Laterality Date   CESAREAN SECTION  2000, 2002, 2013   WISDOM TOOTH EXTRACTION      Prior to Admission medications   Medication Sig Start Date End Date Taking? Authorizing Provider  acetaminophen (TYLENOL) 325 MG tablet Take 2 tablets (650 mg total) by mouth every 6 (six) hours as needed for moderate pain. 07/02/20 07/02/21  Shaune Pollack, MD  diphenhydrAMINE (BENADRYL) 50 MG capsule Take 1 capsule (50 mg total) by mouth at bedtime as needed (PRN for sleep per MD orders). 06/15/20   Clapacs, Jackquline Denmark, MD  docusate sodium (COLACE) 100 MG capsule Take 2 capsules (200 mg total) by mouth 2 (two) times daily. 06/15/20   Clapacs, Jackquline Denmark, MD  hydrocortisone cream 1 % Apply topically 2 (two) times daily. To affected area 06/15/20   Clapacs, Jackquline Denmark, MD  metroNIDAZOLE (FLAGYL) 500 MG tablet Take 1 tablet (500 mg total) by mouth every 12 (twelve) hours. 06/15/20   Clapacs, Jackquline Denmark, MD  polyethylene glycol (MIRALAX / GLYCOLAX) 17 g packet Take 17 g by mouth daily. 06/16/20  Clapacs, Jackquline Denmark, MD  sertraline (ZOLOFT) 25 MG tablet Take 3 tablets (75 mg total) by mouth daily. 06/16/20   Clapacs, Jackquline Denmark, MD    Allergies Aspirin, Banana, Other, Peanut oil, Peanut-containing drug products, and Pecan nut (diagnostic)  Family History  Problem Relation Age of Onset   Diabetes Mother     Social History Social History   Tobacco Use   Smoking status: Former Smoker    Packs/day: 0.50   Smokeless tobacco: Never  Used  Substance Use Topics   Alcohol use: Not Currently    Comment: social drinker   Drug use: Not Currently    Types: Cocaine, Marijuana    Comment: Positive UDS 12/2017    Review of Systems  Constitutional: Negative for fever. Eyes: Negative for visual changes. ENT: Negative for sore throat. Neck: No neck pain  Cardiovascular: Negative for chest pain. Respiratory: Negative for shortness of breath. Gastrointestinal: Negative for abdominal pain, vomiting or diarrhea. Genitourinary: Negative for dysuria. Musculoskeletal: Negative for back pain. Skin: Negative for rash. Neurological: Negative for headaches, weakness or numbness. Psych: + SI and depression. No HI  ____________________________________________   PHYSICAL EXAM:  VITAL SIGNS: ED Triage Vitals [07/15/20 0010]  Enc Vitals Group     BP (!) 161/73     Pulse Rate 95     Resp 18     Temp 98.1 F (36.7 C)     Temp Source Oral     SpO2 99 %     Weight      Height      Head Circumference      Peak Flow      Pain Score      Pain Loc      Pain Edu?      Excl. in GC?     Constitutional: Alert and oriented, crying and very depressed.  HEENT:      Head: Normocephalic and atraumatic.         Eyes: Conjunctivae are normal. Sclera is non-icteric.       Mouth/Throat: Mucous membranes are moist.       Neck: Supple with no signs of meningismus. Cardiovascular: Regular rate and rhythm.  Respiratory: Normal respiratory effort.  Gastrointestinal: Gravid, non tender, and non distended. Musculoskeletal: No edema, cyanosis, or erythema of extremities. Neurologic: Normal speech and language. Face is symmetric. Moving all extremities. No gross focal neurologic deficits are appreciated. Skin: Skin is warm, dry and intact. No rash noted. Psychiatric: Mood and affect are depressed. Speech and behavior are normal.  ____________________________________________   LABS (all labs ordered are listed, but only abnormal results  are displayed)  Labs Reviewed  COMPREHENSIVE METABOLIC PANEL - Abnormal; Notable for the following components:      Result Value   Potassium 3.1 (*)    CO2 20 (*)    BUN 5 (*)    Calcium 8.2 (*)    Albumin 3.3 (*)    AST 13 (*)    All other components within normal limits  ETHANOL - Abnormal; Notable for the following components:   Alcohol, Ethyl (B) 131 (*)    All other components within normal limits  SALICYLATE LEVEL - Abnormal; Notable for the following components:   Salicylate Lvl <7.0 (*)    All other components within normal limits  ACETAMINOPHEN LEVEL - Abnormal; Notable for the following components:   Acetaminophen (Tylenol), Serum <10 (*)    All other components within normal limits  CBC - Abnormal; Notable for the following  components:   RBC 3.37 (*)    Hemoglobin 10.1 (*)    HCT 30.5 (*)    All other components within normal limits  SARS CORONAVIRUS 2 BY RT PCR (HOSPITAL ORDER, PERFORMED IN [facility])  URINE DRUG SCREEN, QUALITATIVE ([facility] ONLY)  POC URINE PREG, ED   ____________________________________________  EKG  none  ____________________________________________  RADIOLOGY  none  ____________________________________________   PROCEDURES  Procedure(s) performed: None Procedures Critical Care performed:  None ____________________________________________   INITIAL IMPRESSION / ASSESSMENT AND PLAN / ED COURSE   34 y.o. female with a history of depression, substance abuse, bipolar disorder, suicide attempt who was brought in under IVC for suicidal thoughts.  Patient is extremely sad, crying, provides very limited history.  Patient is not in labor.  Bedside ultrasound showing normal fetal movement with heart rate of 138.  Patient is clinically intoxicated.  Labs for medical clearance are pending.  Will consult psychiatry for admission.  Patient is currently under IVC and will remain.  Old medical records reviewed.     _________________________ 2:24 AM on 07/15/2020 -----------------------------------------  Discussed with Dr. Valentino Saxon from ObGYN for formal fetal assessment. L&D came down for formal fetal monitoring. No signs of contractions, no decels, normal FHR. Patient is medically cleared for pysch placement.   The patient has been placed in psychiatric observation due to the need to provide a safe environment for the patient while obtaining psychiatric consultation and evaluation, as well as ongoing medical and medication management to treat the patient's condition.  The patient has been placed under full IVC at this time.    Please note:  Patient was evaluated in Emergency Department today for the symptoms described in the history of present illness. Patient was evaluated in the context of the global COVID-19 pandemic, which necessitated consideration that the patient might be at risk for infection with the SARS-CoV-2 virus that causes COVID-19. Institutional protocols and algorithms that pertain to the evaluation of patients at risk for COVID-19 are in a state of rapid change based on information released by regulatory bodies including the CDC and federal and state organizations. These policies and algorithms were followed during the patient's care in the ED.  Some ED evaluations and interventions may be delayed as a result of limited staffing during the pandemic.    ____________________________________________   FINAL CLINICAL IMPRESSION(S) / ED DIAGNOSES   Final diagnoses:  Severe episode of recurrent major depressive disorder, without psychotic features (HCC)  Alcoholic intoxication with complication (HCC)  Suicidal ideation      NEW MEDICATIONS STARTED DURING THIS VISIT:  ED Discharge Orders    None       Note:  This document was prepared using Dragon voice recognition software and may include unintentional dictation errors.    Don Perking, [place], MD 07/15/20 [phone]

## 2020-07-15 NOTE — Progress Notes (Signed)
Pt rates depression 8/10 and anxiety 9/10, Pt denies SI, HI and AVH. Pt has not been compliant with medications since discharge. Pt says that she didn't have a phone but now does. Her stressors are getting depressed at her apartment, losing her children to DSS and having no support. Pt was oriented to the unit. Torrie Mayers RN

## 2020-07-15 NOTE — Plan of Care (Signed)
New admission  Problem: Education: Goal: Knowledge of disease or condition will improve Outcome: Progressing Goal: Understanding of discharge needs will improve Outcome: Progressing   Problem: Health Behavior/Discharge Planning: Goal: Ability to identify changes in lifestyle to reduce recurrence of condition will improve Outcome: Progressing Goal: Identification of resources available to assist in meeting health care needs will improve Outcome: Progressing   Problem: Physical Regulation: Goal: Complications related to the disease process, condition or treatment will be avoided or minimized Outcome: Progressing   Problem: Safety: Goal: Ability to remain free from injury will improve Outcome: Progressing   Problem: Safety: Goal: Ability to remain free from injury will improve Outcome: Progressing   Problem: Education: Goal: Knowledge of [facility] Education information/materials will improve Outcome: Progressing Goal: Emotional status will improve Outcome: Progressing Goal: Mental status will improve Outcome: Progressing Goal: Verbalization of understanding the information provided will improve Outcome: Progressing   Problem: Activity: Goal: Interest or engagement in activities will improve Outcome: Progressing Goal: Sleeping patterns will improve Outcome: Progressing   Problem: Coping: Goal: Ability to verbalize frustrations and anger appropriately will improve Outcome: Progressing Goal: Ability to demonstrate self-control will improve Outcome: Progressing   Problem: Health Behavior/Discharge Planning: Goal: Identification of resources available to assist in meeting health care needs will improve Outcome: Progressing Goal: Compliance with treatment plan for underlying cause of condition will improve Outcome: Progressing   Problem: Physical Regulation: Goal: Ability to maintain clinical measurements within normal limits will improve Outcome: Progressing    Problem: Safety: Goal: Periods of time without injury will increase Outcome: Progressing   Problem: Education: Goal: Ability to state activities that reduce stress will improve Outcome: Progressing   Problem: Coping: Goal: Ability to identify and develop effective coping behavior will improve Outcome: Progressing   Problem: Self-Concept: Goal: Ability to identify factors that promote anxiety will improve Outcome: Progressing Goal: Level of anxiety will decrease Outcome: Progressing Goal: Ability to modify response to factors that promote anxiety will improve Outcome: Progressing

## 2020-07-16 LAB — GLUCOSE, CAPILLARY: Glucose-Capillary: 97 mg/dL (ref 70–99)

## 2020-07-16 MED ORDER — TRAZODONE HCL 100 MG PO TABS
100.0000 mg | ORAL_TABLET | Freq: Every day | ORAL | Status: DC
  Administered 2020-07-16 – 2020-07-17 (×2): 100 mg via ORAL
  Filled 2020-07-16 (×2): qty 1

## 2020-07-16 MED ORDER — ARIPIPRAZOLE ER 400 MG IM SRER
400.0000 mg | INTRAMUSCULAR | Status: DC
  Filled 2020-07-16: qty 2

## 2020-07-16 MED ORDER — SERTRALINE HCL 25 MG PO TABS
50.0000 mg | ORAL_TABLET | Freq: Every day | ORAL | Status: DC
  Administered 2020-07-16 – 2020-07-17 (×2): 50 mg via ORAL
  Filled 2020-07-16 (×2): qty 2

## 2020-07-16 NOTE — Progress Notes (Signed)
Patient's mood has been somewhat brighter than it was earlier in the day. Patient is observed in the dayroom, watching television, with the other patients on the unit without any issues at this time.

## 2020-07-16 NOTE — Progress Notes (Signed)
Patient refused to take the Abilify injection, stating "I don't want that, I don't even know what that is".

## 2020-07-16 NOTE — [facility] Suicide Risk Assessment (Signed)
[facility] INPATIENT:  Family/Significant Other Suicide Prevention Education  Suicide Prevention Education:  Education Completed; [name], sister, [phone] has been identified by the patient as the family member/significant other with whom the patient will be residing, and identified as the person(s) who will aid the patient in the event of a mental health crisis (suicidal ideations/suicide attempt).  With written consent from the patient, the family member/significant other has been provided the following suicide prevention education, prior to the and/or following the discharge of the patient.  The suicide prevention education provided includes the following:  Suicide risk factors  Suicide prevention and interventions  National Suicide Hotline telephone number  [facility] assessment telephone number  [facility] Emergency Assistance 911  [facility] and/or Residential Mobile Crisis Unit telephone number  Request made of family/significant other to:  Remove weapons (e.g., guns, rifles, knives), all items previously/currently identified as safety concern.    Remove drugs/medications (over-the-counter, prescriptions, illicit drugs), all items previously/currently identified as a safety concern.  The family member/significant other verbalizes understanding of the suicide prevention education information provided.  The family member/significant other agrees to remove the items of safety concern listed above.  She reports that patient has a history of being aggressive and yelling.  She reports that "I can't deal with her because of those things".  She reports "I attempted to help her but if you don't move fast enough she gets aggressive and yells".  She reports that she can not support the patient "because she lies and steal, I can support her from distance"  She reports that patient likely has access to knives but no guns.  She reports that it is possible that the  patient is depressed due to having lost custody of all of her children.    [name] 07/16/2020, 12:58 PM

## 2020-07-16 NOTE — Progress Notes (Signed)
Patient alert and oriented x 4, affect is blunted thoughts are organized and coherent. Patient's denies SI/HI/AVH interacting appropriately with peers and staff she c/o back pain and was medicated with tylenol PRN as needed  she was receptive to staff, 15 minutes safety checks maintained will continue to monitor °

## 2020-07-16 NOTE — Progress Notes (Signed)
Patient reported that she does not have an allergy to Eggs and spoke with the MD about this. This Clinical research associate spoke with MD about removing this allergy and he was fine fine with this. Allergy has been removed and documented.

## 2020-07-16 NOTE — Progress Notes (Signed)
   07/16/20 1915  Clinical Encounter Type  Visited With Patient;Other (Comment)  Visit Type Initial  Referral From Physician  Consult/Referral To Chaplain  Chaplain responded to an OR for chaplain to talk with patient. When chaplain reach [facility] unit, Patient was watching television with group and did not want to talk. Chaplain told her to let chaplain know when she is ready to talk. Chaplain will ask [facility] chaplain to follow up.

## 2020-07-16 NOTE — Progress Notes (Signed)
Recreation Therapy Notes   Date: 07/16/2020  Time: 9:30 am   Location: Craft room     Behavioral response: N/A   Intervention Topic: Happiness    Discussion/Intervention: Patient did not attend group.   Clinical Observations/Feedback:  Patient did not attend group.   Karrissa Parchment LRT/CTRS        Kosta Schnitzler 07/16/2020 12:21 PM

## 2020-07-16 NOTE — [facility] Group Notes (Signed)
[facility] Group Notes:  (Nursing/MHT/Case Management/Adjunct)  Date:  07/16/2020  Time:  10:43 PM  Type of Therapy:  Group Therapy  Participation Level:  Active  Participation Quality:  Appropriate and Attentive  Affect:  Appropriate  Cognitive:  Alert  Insight:  Good  Engagement in Group:  Engaged and Limited  Modes of Intervention:  Goals/Wrap Up group  Summary of Progress/Problems: MHT conducted goals and wrap up group encouraging patient to participate in setting goals daily in order increase self-fulfillment. Patient stated that she did not have a goal for the day and did not feel the need to develop one. Patient agrees that she is going to try and think of a goal she can work towards even outside of the facility. Patient also expresses at this point and time she is doing okay mentally but physically experiencing pain in her back and groin area.  Sharee Pimple 07/16/2020, 10:43 PM

## 2020-07-16 NOTE — Plan of Care (Signed)
D- Patient alert and oriented. Patient presents in an agitated mood on assessment stating that she slept alright last night, however, she is upset about her menu and not getting the food that she wants. Patient denies anxiety, but endorsed depression, stating "I'm here, I want to go home". Patient also denies SI, HI, AVH, and pain at this time. Patient had no stated goals for today.  A- Scheduled medications administered to patient, per MD orders. Support and encouragement provided.  Routine safety checks conducted every 15 minutes.  Patient informed to notify staff with problems or concerns.  R- No adverse drug reactions noted. Patient contracts for safety at this time. Patient compliant with medications. Patient angry and irritable on assessment because of issues with her menu. Patient remains safe at this time.  Problem: Education: Goal: Knowledge of disease or condition will improve Outcome: Not Progressing Goal: Understanding of discharge needs will improve Outcome: Not Progressing   Problem: Health Behavior/Discharge Planning: Goal: Ability to identify changes in lifestyle to reduce recurrence of condition will improve Outcome: Not Progressing Goal: Identification of resources available to assist in meeting health care needs will improve Outcome: Not Progressing   Problem: Physical Regulation: Goal: Complications related to the disease process, condition or treatment will be avoided or minimized Outcome: Not Progressing   Problem: Safety: Goal: Ability to remain free from injury will improve Outcome: Not Progressing   Problem: Education: Goal: Knowledge of [facility] Education information/materials will improve Outcome: Not Progressing Goal: Emotional status will improve Outcome: Not Progressing Goal: Mental status will improve Outcome: Not Progressing Goal: Verbalization of understanding the information provided will improve Outcome: Not Progressing   Problem:  Activity: Goal: Interest or engagement in activities will improve Outcome: Not Progressing Goal: Sleeping patterns will improve Outcome: Not Progressing   Problem: Coping: Goal: Ability to verbalize frustrations and anger appropriately will improve Outcome: Not Progressing Goal: Ability to demonstrate self-control will improve Outcome: Not Progressing   Problem: Health Behavior/Discharge Planning: Goal: Identification of resources available to assist in meeting health care needs will improve Outcome: Not Progressing Goal: Compliance with treatment plan for underlying cause of condition will improve Outcome: Not Progressing   Problem: Physical Regulation: Goal: Ability to maintain clinical measurements within normal limits will improve Outcome: Not Progressing   Problem: Safety: Goal: Periods of time without injury will increase Outcome: Not Progressing   Problem: Education: Goal: Ability to state activities that reduce stress will improve Outcome: Not Progressing   Problem: Coping: Goal: Ability to identify and develop effective coping behavior will improve Outcome: Not Progressing   Problem: Self-Concept: Goal: Ability to identify factors that promote anxiety will improve Outcome: Not Progressing Goal: Level of anxiety will decrease Outcome: Not Progressing Goal: Ability to modify response to factors that promote anxiety will improve Outcome: Not Progressing   Problem: Education: Goal: Utilization of techniques to improve thought processes will improve Outcome: Not Progressing Goal: Knowledge of the prescribed therapeutic regimen will improve Outcome: Not Progressing   Problem: Activity: Goal: Interest or engagement in leisure activities will improve Outcome: Not Progressing Goal: Imbalance in normal sleep/wake cycle will improve Outcome: Not Progressing   Problem: Coping: Goal: Coping ability will improve Outcome: Not Progressing Goal: Will verbalize  feelings Outcome: Not Progressing   Problem: Health Behavior/Discharge Planning: Goal: Ability to make decisions will improve Outcome: Not Progressing Goal: Compliance with therapeutic regimen will improve Outcome: Not Progressing   Problem: Role Relationship: Goal: Will demonstrate positive changes in social behaviors and relationships Outcome:  Not Progressing   Problem: Safety: Goal: Ability to disclose and discuss suicidal ideas will improve Outcome: Not Progressing Goal: Ability to identify and utilize support systems that promote safety will improve Outcome: Not Progressing   Problem: Self-Concept: Goal: Will verbalize positive feelings about self Outcome: Not Progressing Goal: Level of anxiety will decrease Outcome: Not Progressing

## 2020-07-16 NOTE — Progress Notes (Signed)
Patient is upset that she did not get a menu to order her breakfast for this morning, so she is upset. Patient is walking around the unit yelling and cursing because "I'm hungry". Patient went down into the dayroom and threw the sugar, creamer and Splenda containers onto the floor and stated that she was not going to pick it up.

## 2020-07-16 NOTE — [facility] Suicide Risk Assessment (Signed)
[facility] Admission Suicide Risk Assessment   Nursing information obtained from:  Patient Demographic factors:  Living alone, Unemployed Current Mental Status:  NA Loss Factors:  NA Historical Factors:  NA Risk Reduction Factors:  NA  Total Time spent with patient:   Greater than one hour    Principal Problem: <principal problem not specified> Diagnosis:    Bipolar disorder mixed with psychosis,  Cocaine dependence ----generalized anxiety personality disorder NOS  Subjective Data:   [redacted] weeks gestation sixth child --psych social deprivation --cocaine dependence revolving door admits now here with bipolar symptoms, post cocaine intoxication ----adjustment problems and SI with possible plans  S/P [facility] admission, one week ago and now in same situation    Seeks support, med mgt and all       Continued Clinical Symptoms:  Alcohol Use Disorder Identification Test Final Score (AUDIT): 8 The "Alcohol Use Disorders Identification Test", Guidelines for Use in Primary Care, Second Edition.  World Science writer [facility]). Score between 0-7:  no or low risk or alcohol related problems. Score between 8-15:  moderate risk of alcohol related problems. Score between 16-19:  high risk of alcohol related problems. Score 20 or above:  warrants further diagnostic evaluation for alcohol dependence and treatment.   CLINICAL FACTORS:  See above    Psychiatric Specialty Exam: Physical Exam  Review of Systems  Blood pressure (!) 139/71, pulse 68, temperature 98 F (36.7 C), resp. rate 18, height 4\' 11"  (1.499 m), weight 75.8 kg, last menstrual period 12/04/2019, SpO2 100 %.Body mass index is 33.73 kg/m.  Mental Status already written in initial eval today                                                        COGNITIVE FEATURES THAT CONTRIBUTE TO RISK:  Addictions, psych social despair, educational and IQ factors   SUICIDE RISK:    Moderate   PLAN OF CARE:  standard inpatient psych care with consults   I certify that inpatient services furnished can reasonably be expected to improve the patient's condition.   14/09/2019, MD 07/16/2020, 6:40 PM

## 2020-07-16 NOTE — Progress Notes (Signed)
Recreation Therapy Notes  INPATIENT RECREATION THERAPY ASSESSMENT  Patient Details Name: Roselie Cirigliano MRN: [id number] DOB: [birth year] Today's Date: 07/16/2020       Information Obtained From: Patient  Able to Participate in Assessment/Interview: Yes  Patient Presentation: Responsive  Reason for Admission (Per Patient): Active Symptoms  Patient Stressors:    Coping Skills:   Music, Substance Abuse  Leisure Interests (2+):   (Cooking)  Frequency of Recreation/Participation:    Awareness of Community Resources:     [employer]:     Current Use:    If no, Barriers?:    Expressed Interest in [employer] Information:    [place] of Residence:  Film/video editor  Patient Main Form of Transportation: Therapist, music  Patient Strengths:  N/A  Patient Identified Areas of Improvement:  Not be depressed  Patient Goal for Hospitalization:  Finding somewhere to go  Current SI (including self-harm):  No  Current HI:  No  Current AVH: No  Staff Intervention Plan: Group Attendance, Collaborate with Interdisciplinary Treatment Team  Consent to Intern Participation: N/A  Alicia Seib 07/16/2020, 3:46 PM

## 2020-07-16 NOTE — H&P (Signed)
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  Considered higher risk for pregnancy, now on Psych Unit for issues of depression, anxiety and possible suicidal behaviors   Patient has long complex history this is her sixth child, other five childen in CPS DSS custody she does not know new info or updates.  She has one sister and Dad but does regularly see them.   She mainly has bipolar issues and cocaine dependence and comes to ER when she is alone and desperate.  She has no transportation and no regular major care.   S/P [facility] admission for two weeks, discharged one week ago and again relapsed with similar issues  Now here pending stabilization and support   Associated Signs/Symptoms:  She has combined issues of generalized anxiety, bipolar disordered problems, paranoid voices and related psychoses, agitation and personality problems that interfere with ongoing continuity and sobriety.   She has ongoing addictive personality with ongoing substance dependence that is affecting her pregnancies in general.    Past Psychiatric History: see above  Last admitted to [facility] last week.   Currently at risk to herself as she does not contract fully for safety Does not endorse harm to others I    Prior Inpatient Therapy:   recently admitted to our unit and often comes to ER for stablization    Prior  Outpatient Therapy:   sporadic prenatal care --no regular psych followup   Or High risk clinic follow up    Alcohol Screening: 1. How often do you have a drink containing alcohol?: 4 or more times a week 2. How many drinks containing alcohol do you have on a typical day when you are drinking?: 3 or 4 3. How often do you have six or more drinks on one occasion?: Weekly AUDIT-C Score: 8 4. How often during the last year have you found that you were not able to stop drinking once you had started?: Never 5. How often during the last year have you failed to do what was normally expected from you because of drinking?: Never 6. How often during the last year have you needed a first drink in the morning to get yourself going after a heavy drinking session?: Never 7. How often during the last year have you had a feeling of guilt of remorse after drinking?: Never 8. How often during the last year have you been unable to remember what happened the night before because you had been drinking?: Never 9. Have you or someone else been injured as a result of your drinking?: No 10. Has a relative or friend or a doctor or another health worker been concerned about your drinking or suggested you cut down?: No Alcohol Use Disorder Identification Test Final Score (AUDIT): 8 Alcohol Brief Interventions/Follow-up: Continued Monitoring Substance Abuse History in the last 12 months:  Yes.   Consequences of Substance Abuse: Negative Previous Psychotropic Medications: Yes  Psychological Evaluations: No  Past Medical History:  Past Medical History:  Diagnosis Date  . Anxiety   . Depression   . Gestational diabetes   . History of substance abuse (HCC)   . History of suicide attempt   . History of thyroid disease   . Hypertension   . Thyroid disease     Past Surgical History:  Procedure Laterality Date  . CESAREAN SECTION  2000, 2002, 2013  . WISDOM TOOTH EXTRACTION     Family History:  Family History   Problem Relation Age of Onset  . Diabetes Mother    Family Psychiatric  History: she is not clear on this matter, remains vague     Tobacco Screening: Have you used any form of tobacco in the last 30 days? (Cigarettes, Smokeless Tobacco, Cigars, and/or Pipes): Yes Tobacco use, Select all that apply: 5 or more cigarettes per day Are you interested in Tobacco Cessation Medications?: No, patient refused Counseled patient on smoking cessation including recognizing danger situations, developing coping skills and basic information about quitting provided: Refused/Declined practical counseling Social History:  Lives alone with no income --no major support --- No in home visits or ACT team so far  Social History   Substance and Sexual Activity  Alcohol Use Not Currently   Comment: social drinker     Social History   Substance and Sexual Activity  Drug Use Not Currently  . Types: Cocaine, Marijuana   Comment: Positive UDS 12/2017    Additional Social History: Limited insight into prenatal and post natal care  She says child is unwanted and she is not sure what to do after delivery  Not clear if CPS will assume custody Social work updates pending                 Allergies:   Allergies  Allergen Reactions  . Aspirin Anaphylaxis, Diarrhea and Nausea And Vomiting  . Banana Anaphylaxis, Itching, Shortness Of Breath and Swelling  . Other Itching, Shortness Of Breath and Swelling  . Peanut Oil Itching, Shortness Of Breath and Swelling  . Peanut-Containing Drug Products Shortness Of Breath, Itching and Swelling  . Pecan Nut (Diagnostic) Anaphylaxis   Lab Results:  Results for orders placed or performed during the hospital encounter of 07/15/20 (from the past 48 hour(s))  Glucose, capillary     Status: None   Collection Time: 07/16/20  8:28 AM  Result Value Ref Range   Glucose-Capillary 97 70 - 99 mg/dL    Comment: Glucose reference range applies only to samples taken after  fasting for at least 8 hours.    Blood Alcohol level:  Lab Results  Component Value Date   ETH 131 (H) 07/15/2020   ETH 89 (H) 06/02/2020    Metabolic Disorder Labs:  Lab Results  Component Value Date   HGBA1C 5.5 06/05/2020   MPG 111.15 06/05/2020   MPG 99.67 01/02/2018   No results found for: PROLACTIN Lab Results  Component Value Date   CHOL 218 (H) 01/02/2018   TRIG 187 (H) 01/02/2018   HDL 81 01/02/2018   CHOLHDL 2.7 01/02/2018   VLDL 37 01/02/2018   LDLCALC 100 (H) 01/02/2018   LDLCALC 82 01/12/2016    Current Medications: Current Facility-Administered Medications  Medication Dose Route Frequency Provider Last Rate Last Admin  . acetaminophen (TYLENOL) tablet 650 mg  650 mg Oral Q6H PRN Charm Rings, NP   650 mg at 07/16/20 1416  . alum & mag hydroxide-simeth (MAALOX/MYLANTA) 200-200-20 MG/5ML suspension 30 mL  30 mL Oral Q4H  PRN Charm RingsLord, Jamison Y, NP      . ARIPiprazole ER (ABILIFY MAINTENA) injection 400 mg  400 mg Intramuscular Q28 days Roselind Messierao, Himmat Enberg, MD      . diphenhydrAMINE (BENADRYL) capsule 50 mg  50 mg Oral QHS PRN Gillermo Murdochhompson, Jacqueline, NP      . hydrocortisone (ANUSOL-HC) suppository 25 mg  25 mg Rectal BID PRN Charm RingsLord, Jamison Y, NP      . magnesium hydroxide (MILK OF MAGNESIA) suspension 30 mL  30 mL Oral Daily PRN Charm RingsLord, Jamison Y, NP      . prenatal multivitamin tablet 1 tablet  1 tablet Oral Daily Charm RingsLord, Jamison Y, NP   1 tablet at 07/16/20 [phone]  . sertraline (ZOLOFT) tablet 50 mg  50 mg Oral Daily Roselind Messierao, Marieke Lubke, MD      . traZODone (DESYREL) tablet 100 mg  100 mg Oral QHS Roselind Messierao, Joniqua Sidle, MD      . witch hazel-glycerin (TUCKS) pad   Topical PRN Charm RingsLord, Jamison Y, NP   Given at 07/15/20 2135   PTA Medications: Medications Prior to Admission  Medication Sig Dispense Refill Last Dose  . CALCIUM CARBONATE ANTACID PO Take 200 mg by mouth daily. chewable tablet 200 mg of elem calcium 200 mg of elem calcium Oral TID PRN   Past Week at Unknown time  .  hydrocortisone 2.5 % cream Apply 1 application topically 2 (two) times daily.   Past Week at Unknown time  . insulin NPH Human (NOVOLIN N) 100 UNIT/ML injection Inject 30 units under the skin each morning. Inject 10 units under the skin each evening.   Past Week at Unknown time  . polyethylene glycol (MIRALAX / GLYCOLAX) 17 g packet Take 17 g by mouth daily. 30 each 1 Past Week at Unknown time  . Prenatal Vit-Fe Fumarate-FA (PNV PRENATAL PLUS MULTIVITAMIN) 27-1 MG TABS Take 1 tablet by mouth daily.   Past Week at Unknown time  . QUEtiapine (SEROQUEL) 25 MG tablet Take by mouth. (SEROQUEL) 25 MG tablet  Take 1 tablet (25 mg total) by mouth Three (3) times a day as needed (anxiety, agitation   Past Week at PRN  . QUEtiapine (SEROQUEL) 50 MG tablet Take 50 mg by mouth at bedtime.    Past Week at Unknown time  . sertraline (ZOLOFT) 50 MG tablet Take 50 mg by mouth at bedtime.   Past Week at Unknown time  . traZODone (DESYREL) 25 mg TABS tablet Take 25 mg by mouth at bedtime as needed for sleep.   Past Week at PRN    Musculoskeletal: Strength & Muscle Tone: she says she has pain in her thigh ligaments  Gait & Station: limited by gestation  Patient leans:   Psychiatric Specialty Exam: Physical Exam Pulmonary:     Breath sounds: Rhonchi present.     Review of Systems  Blood pressure (!) 139/71, pulse 68, temperature 98 F (36.7 C), resp. rate 18, height 4\' 11"  (1.499 m), weight 75.8 kg, last menstrual period 12/04/2019, SpO2 100 %.Body mass index is 33.73 kg/m.    Mental Status  Appearance --forlorn, unkept Oriented to person place date and time   Rapport --poor shouting and angry then calms down Eye contact fair Concentration and attention fair Consciousness not clouded or fluctuant Speech --loud angry pressured Thought process and content --paranoid fearful illogical disorganized when anxious and angry or when she feels s slighted or rejected   Judgement insight reliability all poor   Intelligence and fund of knowledge poor Suicidal ideation --  she is not contracting for safety HI and plans --none Abstraction --remains somewhat concrete No active voices auditoroy or visual hallucinations   Sleep --early am awakening  ADL's limited when stressed Movements ---no psych induced movements Muscle strength --normal  Gait and station limited by pregnancy Cognition impaired when stressed                                                 Treatment Plan Summary:  AA female with chronic dual diagnosis now back after [facility] admission for two weeks relapsed on cocaine, now due for Abilify Maintena --with unfolding bipolar symptoms with psychosis   Generalized anxiety and personality disorder in crisis symptoms   Remains on unit for stabilization, med support and IM and OB Gyn consults --as she reports Kendell Bane said she may have gestational diabetes  Abilify maintena given today 400 mg IM She requests Trazodone 100 qhs And Zoloft 50 as they do work but she does not follow up to get prescriptions and for care    ESL  At least 7 days         Observation Level/Precautions:  q 15   Laboratory:  pending  Psychotherapy:  Daily brief   Medications:  See orders  Consultations:    Discharge Concerns:  Safety sobriety fetal life and maternal condition   Estimated LOS:    7  Other:     Physician Treatment Plan   Med mgt group support CW support daily rounds discharge planning  Psycho education  Family meetings Chaplain consult ----IM consult Ob GYN consult  ----treatment team         I certify that inpatient services furnished can reasonably be expected to improve the patient's condition.    Roselind Messier, MD 7/23/20216:13 PM

## 2020-07-16 NOTE — B[facility] Counselor (Signed)
Adult Comprehensive Assessment  Patient ID: [name], female   DOB: 1986/05/28, 34 y.o.   MRN: [id number]  Information Source: Information source: Patient   Current Stressors:  Patient states their primary concerns and needs for treatment are:: "I hate my house.  I hate being there so I came here" Patient states their goals for this hospitilization and ongoing recovery are:: "find somewhere to go" Family Relationships: Pt has one child in DSS custody/foster care, two children living with his father (also through DSS) and two adult children. Pt currently pregnant with 6th child. Physical health (include injuries & life threatening diseases): Pt reports she is pregnant (32 weeks) Substance abuse: Pt reports cocaine and alcohol use. Bereavement:: Pt reports her mother died unexpectedly recently.    Living/Environment/Situation:  Living Arrangements: lives alone Who else lives in the home?: live alone since mother died How long has patient lived in current situation?: 4 years What is atmosphere in current home: Chaotic   Family History:  Marital status: Single Are you sexually active?: No What is your sexual orientation?: Heterosexual Does patient have children?: Yes How many children?: 6 How is patient's relationship with their children?: Pt reports that she is pregnant with her 6th child.  Two minor children are with their fathers (aged 71 and 4), one minor child in DSS custody in [facility], other children are adults.    Childhood History:  By whom was/is the patient raised?: Mother, Grandparents Additional childhood history information: Pt was born and raised in [place], [place].  Her parents were married, but separated when she was approximately 69-5 yo.  Description of patient's relationship with caregiver when they were a child: Pt was primarily raised by her mother and her mother's parents (she and her mother lived in their home).  She shared that she was very close to her  grandparents.   Patient's description of current relationship with people who raised him/her: mother recently died, limited contact with father. How were you disciplined when you got in trouble as a child/adolescent?: Pt shared that she received occasional spanking from her mother Does patient have siblings?: Yes Number of Siblings: 1 Description of patient's current relationship with siblings: Pt has one older sister.  She shared that she doesn't talk to her sister. Did patient suffer any verbal/emotional/physical/sexual abuse as a child?: No Did patient suffer from severe childhood neglect?: No Has patient ever been sexually abused/assaulted/raped as an adolescent or adult?: No Was the patient ever a victim of a crime or a disaster?: No Witnessed domestic violence?: No Has patient been effected by domestic violence as an adult?: No 06/05/20: no update to trauma history.    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  Pt denied amphetamine and cannabis use, however, UDS was positive.   If attempted suicide, did drugs/alcohol play a role in this?:  No Alcohol/Substance Abuse Treatment Hx: Denies past history Has alcohol/substance abuse ever caused legal problems?: Yes(possession of cocaine "years ago")   Social Support System:   Forensic psychologist System: None Describe Community Support System: "nobody" since her mother died Type of faith/religion: no How does patient's faith help to cope with current illness?: na   Leisure/Recreation:   Leisure and Hobbies: "nothing"   Strengths/Needs:   What is the patient's perception of their strengths?: cooking, being a mother Patient states they can use these personal strengths during their treatment to contribute to their recovery: when things are better with her kids, she is less depressed Patient states these barriers may affect/interfere with their treatment: none Patient states these barriers may affect their return to the community: transportation issues Other important information patient would like considered in planning for their treatment: no   Discharge Plan:   Currently receiving community mental health services: Yes: current pt with [facility] Patient states concerns and preferences for aftercare planning are: Pt wants to continue with [facility]. Patient states they will know when they are safe and ready for discharge when: "I don't' know" Does patient have access to transportation?: No Does patient have financial barriers related to discharge medications?: No Plan for no access to transportation at discharge: CSW will assess for plan. Will patient be returning to same living situation after discharge?: Yes   Summary/Recommendations:   Summary and Recommendations (to be completed by the evaluator): Patient is a 34 year old female [place] [facility]   She presents to the hospital following suicidal ideations.  She has a primary diagnosis of Major Depressive Disorder, Severe, without psychotic features.  Recommendations include: crisis stabilization, therapeutic  milieu, encourage group attendance and participation, medication management for detox/mood stabilization and development of comprehensive mental wellness/sobriety plan.  [name]. 07/16/2020

## 2020-07-17 ENCOUNTER — Encounter: Payer: Self-pay | Admitting: Obstetrics and Gynecology

## 2020-07-17 ENCOUNTER — Observation Stay
Admission: RE | Admit: 2020-07-17 | Discharge: 2020-07-18 | Disposition: A | Discharge status: Other Acute Inpatient Hospital | Payer: Medicaid Other | Source: Intra-hospital | Attending: Obstetrics and Gynecology | Admitting: Obstetrics and Gynecology

## 2020-07-17 DIAGNOSIS — R109 Unspecified abdominal pain: Secondary | ICD-10-CM | POA: Insufficient documentation

## 2020-07-17 DIAGNOSIS — Z9114 Patient's other noncompliance with medication regimen: Secondary | ICD-10-CM | POA: Insufficient documentation

## 2020-07-17 DIAGNOSIS — O26893 Other specified pregnancy related conditions, third trimester: Principal | ICD-10-CM | POA: Insufficient documentation

## 2020-07-17 DIAGNOSIS — E079 Disorder of thyroid, unspecified: Secondary | ICD-10-CM | POA: Insufficient documentation

## 2020-07-17 DIAGNOSIS — F329 Major depressive disorder, single episode, unspecified: Secondary | ICD-10-CM | POA: Insufficient documentation

## 2020-07-17 DIAGNOSIS — F419 Anxiety disorder, unspecified: Secondary | ICD-10-CM | POA: Insufficient documentation

## 2020-07-17 DIAGNOSIS — O99343 Other mental disorders complicating pregnancy, third trimester: Secondary | ICD-10-CM | POA: Insufficient documentation

## 2020-07-17 DIAGNOSIS — O10913 Unspecified pre-existing hypertension complicating pregnancy, third trimester: Secondary | ICD-10-CM | POA: Insufficient documentation

## 2020-07-17 DIAGNOSIS — O26899 Other specified pregnancy related conditions, unspecified trimester: Secondary | ICD-10-CM

## 2020-07-17 DIAGNOSIS — Z87891 Personal history of nicotine dependence: Secondary | ICD-10-CM | POA: Insufficient documentation

## 2020-07-17 DIAGNOSIS — Z79899 Other long term (current) drug therapy: Secondary | ICD-10-CM | POA: Insufficient documentation

## 2020-07-17 DIAGNOSIS — Z98891 History of uterine scar from previous surgery: Secondary | ICD-10-CM

## 2020-07-17 DIAGNOSIS — Z3A32 32 weeks gestation of pregnancy: Secondary | ICD-10-CM | POA: Insufficient documentation

## 2020-07-17 DIAGNOSIS — O99283 Endocrine, nutritional and metabolic diseases complicating pregnancy, third trimester: Secondary | ICD-10-CM | POA: Insufficient documentation

## 2020-07-17 DIAGNOSIS — O24419 Gestational diabetes mellitus in pregnancy, unspecified control: Secondary | ICD-10-CM

## 2020-07-17 LAB — URINALYSIS, COMPLETE (UACMP) WITH MICROSCOPIC
Bilirubin Urine: NEGATIVE
Glucose, UA: NEGATIVE mg/dL
Hgb urine dipstick: NEGATIVE
Ketones, ur: NEGATIVE mg/dL
Leukocytes,Ua: NEGATIVE
Nitrite: NEGATIVE
Protein, ur: NEGATIVE mg/dL
Specific Gravity, Urine: 1.006 (ref 1.005–1.030)
pH: 6 (ref 5.0–8.0)

## 2020-07-17 LAB — WET PREP, GENITAL
Clue Cells Wet Prep HPF POC: NONE SEEN
Sperm: NONE SEEN
Trich, Wet Prep: NONE SEEN
Yeast Wet Prep HPF POC: NONE SEEN

## 2020-07-17 LAB — GLUCOSE, CAPILLARY: Glucose-Capillary: 92 mg/dL (ref 70–99)

## 2020-07-17 MED ORDER — LACTATED RINGERS IV BOLUS
1000.0000 mL | Freq: Once | INTRAVENOUS | Status: AC
  Administered 2020-07-18: 1000 mL via INTRAVENOUS

## 2020-07-17 MED ORDER — BENZTROPINE MESYLATE 1 MG PO TABS
1.0000 mg | ORAL_TABLET | Freq: Two times a day (BID) | ORAL | Status: DC
  Administered 2020-07-17: 1 mg via ORAL
  Filled 2020-07-17: qty 1

## 2020-07-17 NOTE — Tx Team (Signed)
Interdisciplinary Treatment and Diagnostic Plan Update  07/17/2020 Time of Session: 12:30 PM [name] MRN: [id number]  Principal Diagnosis: <principal problem not specified>  Secondary Diagnoses: Active Problems:   Major depressive disorder, recurrent severe without psychotic features ([place])   Current Medications:  Current Facility-Administered Medications  Medication Dose Route Frequency Provider Last Rate Last Admin  . acetaminophen (TYLENOL) tablet 650 mg  650 mg Oral Q6H PRN Patrecia Pour, NP   650 mg at 07/16/20 1416  . alum & mag hydroxide-simeth (MAALOX/MYLANTA) 200-200-20 MG/5ML suspension 30 mL  30 mL Oral Q4H PRN Patrecia Pour, NP      . ARIPiprazole ER (ABILIFY MAINTENA) injection 400 mg  400 mg Intramuscular Q28 days Eulas Post, MD      . benztropine (COGENTIN) tablet 1 mg  1 mg Oral BID Eulas Post, MD      . diphenhydrAMINE (BENADRYL) capsule 50 mg  50 mg Oral QHS PRN Caroline Sauger, NP   50 mg at 07/16/20 2149  . hydrocortisone (ANUSOL-HC) suppository 25 mg  25 mg Rectal BID PRN Patrecia Pour, NP      . magnesium hydroxide (MILK OF MAGNESIA) suspension 30 mL  30 mL Oral Daily PRN Patrecia Pour, NP      . prenatal multivitamin tablet 1 tablet  1 tablet Oral Daily Patrecia Pour, NP   1 tablet at 07/17/20 [phone]  . sertraline (ZOLOFT) tablet 50 mg  50 mg Oral Daily Eulas Post, MD   50 mg at 07/17/20 0827  . traZODone (DESYREL) tablet 100 mg  100 mg Oral QHS Eulas Post, MD   100 mg at 07/16/20 2149  . witch hazel-glycerin (TUCKS) pad   Topical PRN Patrecia Pour, NP   Given at 07/15/20 2135   PTA Medications: Medications Prior to Admission  Medication Sig Dispense Refill Last Dose  . CALCIUM CARBONATE ANTACID PO Take 200 mg by mouth daily. chewable tablet 200 mg of elem calcium 200 mg of elem calcium Oral TID PRN   Past Week at Unknown time  . hydrocortisone 2.5 % cream Apply 1 application topically 2 (two) times daily.   Past  Week at Unknown time  . insulin NPH Human (NOVOLIN N) 100 UNIT/ML injection Inject 30 units under the skin each morning. Inject 10 units under the skin each evening.   Past Week at Unknown time  . polyethylene glycol (MIRALAX / GLYCOLAX) 17 g packet Take 17 g by mouth daily. 30 each 1 Past Week at Unknown time  . Prenatal Vit-Fe Fumarate-FA (PNV PRENATAL PLUS MULTIVITAMIN) 27-1 MG TABS Take 1 tablet by mouth daily.   Past Week at Unknown time  . QUEtiapine (SEROQUEL) 25 MG tablet Take by mouth. (SEROQUEL) 25 MG tablet  Take 1 tablet (25 mg total) by mouth Three (3) times a day as needed (anxiety, agitation   Past Week at PRN  . QUEtiapine (SEROQUEL) 50 MG tablet Take 50 mg by mouth at bedtime.    Past Week at Unknown time  . sertraline (ZOLOFT) 50 MG tablet Take 50 mg by mouth at bedtime.   Past Week at Unknown time  . traZODone (DESYREL) 25 mg TABS tablet Take 25 mg by mouth at bedtime as needed for sleep.   Past Week at PRN    Patient Stressors: Financial difficulties Marital or family conflict Medication change or noncompliance Substance abuse  Patient Strengths: Ability for insight Communication skills  Treatment Modalities: Medication Management, Group therapy, Case management,  1  to 1 session with clinician, Psychoeducation, Recreational therapy.   Physician Treatment Plan for Primary Diagnosis: <principal problem not specified> Long Term Goal(s):     Short Term Goals:    Medication Management: Evaluate patient's response, side effects, and tolerance of medication regimen.  Therapeutic Interventions: 1 to 1 sessions, Unit Group sessions and Medication administration.  Evaluation of Outcomes: Not Met  Physician Treatment Plan for Secondary Diagnosis: Active Problems:   Major depressive disorder, recurrent severe without psychotic features ([place])  Long Term Goal(s):     Short Term Goals:       Medication Management: Evaluate patient's response, side effects, and tolerance  of medication regimen.  Therapeutic Interventions: 1 to 1 sessions, Unit Group sessions and Medication administration.  Evaluation of Outcomes: Not Met   RN Treatment Plan for Primary Diagnosis: <principal problem not specified> Long Term Goal(s): Knowledge of disease and therapeutic regimen to maintain health will improve  Short Term Goals: Ability to remain free from injury will improve, Ability to verbalize frustration and anger appropriately will improve, Ability to demonstrate self-control, Ability to participate in decision making will improve, Ability to verbalize feelings will improve, Ability to disclose and discuss suicidal ideas, Ability to identify and develop effective coping behaviors will improve and Compliance with prescribed medications will improve  Medication Management: RN will administer medications as ordered by provider, will assess and evaluate patient's response and provide education to patient for prescribed medication. RN will report any adverse and/or side effects to prescribing provider.  Therapeutic Interventions: 1 on 1 counseling sessions, Psychoeducation, Medication administration, Evaluate responses to treatment, Monitor vital signs and CBGs as ordered, Perform/monitor CIWA, COWS, AIMS and Fall Risk screenings as ordered, Perform wound care treatments as ordered.  Evaluation of Outcomes: Not Met   LCSW Treatment Plan for Primary Diagnosis: <principal problem not specified> Long Term Goal(s): Safe transition to appropriate next level of care at discharge, Engage patient in therapeutic group addressing interpersonal concerns.  Short Term Goals: Engage patient in aftercare planning with referrals and resources, Increase social support, Increase ability to appropriately verbalize feelings, Increase emotional regulation, Facilitate acceptance of mental health diagnosis and concerns, Facilitate patient progression through stages of change regarding substance use  diagnoses and concerns, Identify triggers associated with mental health/substance abuse issues and Increase skills for wellness and recovery  Therapeutic Interventions: Assess for all discharge needs, 1 to 1 time with Social worker, Explore available resources and support systems, Assess for adequacy in community support network, Educate family and significant other(s) on suicide prevention, Complete Psychosocial Assessment, Interpersonal group therapy.  Evaluation of Outcomes: Not Met   Progress in Treatment: Attending groups: No. Participating in groups: No. Taking medication as prescribed: Yes. Toleration medication: Yes. Family/Significant other contact made: Yes, individual(s) contacted:  Sister was contacted  Patient understands diagnosis: Yes. Discussing patient identified problems/goals with staff: No. Medical problems stabilized or resolved: No. Denies suicidal/homicidal ideation: Yes. Issues/concerns per patient self-inventory: No. Other: Patient spoke about having no goals and unsure of her plan for discharge   New problem(s) identified: No, Describe:  None  New Short Term/Long Term Goal(s): medication stabilization, elimination of SI thoughts, development of comprehensive mental wellness plan  Patient Goals:  Patient stated that she has no goals. Patient also spoke about her children being in [place] custody but did not know the status of the case.   Discharge Plan or Barriers: Discharge plan is pending. Housing could be a barrier for patient when discharged. Patient also is not sure if she  wants to care for her unborn child and if DSS will take her baby into custody.   Reason for Continuation of Hospitalization: Depression Suicidal ideation  Estimated Length of Stay: 5-7 Days   Attendees: Patient: 07/17/2020 3:48 PM  Physician: Dr. Janese Banks, MD 07/17/2020 3:48 PM  Nursing: Collier Bullock, RN 07/17/2020 3:48 PM  RN Care Manager: 07/17/2020 3:48 PM  Social Worker: Raina Mina,  Latanya Presser 07/17/2020 3:48 PM  Recreational Therapist:  07/17/2020 3:48 PM  Other:  07/17/2020 3:48 PM  Other:  07/17/2020 3:48 PM  Other: 07/17/2020 3:48 PM    Scribe for Treatment Team: Raina Mina, [place] 07/17/2020 3:48 PM

## 2020-07-17 NOTE — [facility] Group Notes (Signed)
[facility] Group Notes: (Clinical Social Work)   07/17/2020      Type of Therapy:  Group Therapy   Participation Level:  Did Not Attend - was invited individually by Nurse/MHT and chose not to attend.   Susa Simmonds, LCSWA 07/17/2020  3:57 PM

## 2020-07-17 NOTE — OB Triage Note (Signed)
Pt reports to unit from [facility] for ctx that started around 2100 and have been every 8 minutes. Pt states she has been drinking plenty of water today. Pt reports +FM, denies vaginal bleeding and LOF. Pt states she is in [facility] for depression and substance abuse, specifically alcohol and cocaine. Vital signs WDL. External monitors applied and assessing. Initial FHTs 160s. Will continue to monitor.

## 2020-07-17 NOTE — Progress Notes (Signed)
D- Patient alert and oriented. Affect/mood is cooperative and pleasant. Pt denies SI, HI, AVH. Pt has had a PRN for pain in her back.    A- Scheduled medications administered to patient, per MD orders. Support and encouragement provided.  Routine safety checks conducted every 15 minutes.  Patient informed to notify staff with problems or concerns.  R- No adverse drug reactions noted. Patient contracts for safety at this time. Patient compliant with medications and treatment plan. Patient receptive, calm, and cooperative. Patient interacts well with others on the unit.  Patient remains safe at this time.  Torrie Mayers RN

## 2020-07-17 NOTE — Plan of Care (Signed)
  Problem: Safety: Goal: Ability to remain free from injury will improve Outcome: Progressing   Problem: Education: Goal: Emotional status will improve Outcome: Progressing Goal: Mental status will improve Outcome: Progressing   Problem: Activity: Goal: Interest or engagement in activities will improve Outcome: Progressing Goal: Sleeping patterns will improve Outcome: Progressing   Problem: Coping: Goal: Ability to verbalize frustrations and anger appropriately will improve Outcome: Progressing Goal: Ability to demonstrate self-control will improve Outcome: Progressing   Problem: Self-Concept: Goal: Level of anxiety will decrease Outcome: Progressing   Problem: Education: Goal: Knowledge of the prescribed therapeutic regimen will improve Outcome: Progressing   Problem: Activity: Goal: Interest or engagement in leisure activities will improve Outcome: Progressing   Problem: Health Behavior/Discharge Planning: Goal: Compliance with therapeutic regimen will improve Outcome: Progressing   Problem: Self-Concept: Goal: Level of anxiety will decrease Outcome: Progressing

## 2020-07-17 NOTE — Progress Notes (Signed)
Benztropine and pregnancy-per  Drugs in Pregnancy and Lactation. Use in pregnancy is  Probably compatible.

## 2020-07-17 NOTE — Progress Notes (Signed)
Patient ID: [name], female   DOB: 1986-09-15, 34 y.o.   MRN: [id number]    [facility] MD Progress Note Huey Romans, MD    Referring Physician:   07/17/2020 3:06 PM Patient Identification: [name] MRN:  [id number]  Reason for admission  Placing herself in danger and child's danger after repeated admissions and relapses of cocaine dependence and bipolar problems, psych social deprivation impinging on her and unborn child    Medical Diagnosis:    Bipolar Mixed with Psychosis Generalized anxiety  Adjustment disorder mixed emotions and conduct Phase of life problems    Vitals: Blood pressure (!) 139/71, pulse 68, temperature 98 F (36.7 C), resp. rate 18, height 4\' 11"  (1.499 m), weight 75.8 kg, last menstrual period 12/04/2019, SpO2 100 %.Body mass index is 33.73 kg/m.   Psychiatric Diagnosis:   Ongoing psych social chaos against current sixth pregnancy, no care at home no supports and has med non compliance problems along with lack of DSS communication and support for unclear reasons    DAILY NARRATIVE  Calmer now that she  Has right meals and all --and return of regular meds Treatment team and individual done today covering many areas      Subjective:    See below      objective:   No other new labs and all Better with MS     Pertinent Psychiatric Protocols and Assessments:   None  AIMS:  , ,  ,  ,       CIWA:      COWS:     PHYSICAL EXAM  Physical Exam  SYSTEMS REVIEW  Review of Systems  MENTAL STATUS: Pertinents Only    Alert more cooperative More rested calmer  Oriented times four Not clouded or angry Better balance  Mood more stable  No frank psychsois or mania Contracts for safety  No active other issues for now  Speech normal not loud or pressured  Not on the go as before     ADL's    Needs support   Sleep improves with meds   Cognition   Clouded when  s tressed  Appetite  Better with double meals   Movements  Normal    Psychomotor Activity:  Anxious and restless when manic or upset    strength & muscle tone:  Has bilateral ligament leg pain from pregnancy    gait & station: slow due to pregnancy    Current Medications: Current Facility-Administered Medications  Medication Dose Route Frequency Provider Last Rate Last Admin  . acetaminophen (TYLENOL) tablet 650 mg  650 mg Oral Q6H PRN 14/09/2019, NP   650 mg at 07/16/20 1416  . alum & mag hydroxide-simeth (MAALOX/MYLANTA) 200-200-20 MG/5ML suspension 30 mL  30 mL Oral Q4H PRN 07-03-2001, NP      . ARIPiprazole ER (ABILIFY MAINTENA) injection 400 mg  400 mg Intramuscular Q28 days Charm Rings, MD      . benztropine (COGENTIN) tablet 1 mg  1 mg Oral BID Roselind Messier, MD      . diphenhydrAMINE (BENADRYL) capsule 50 mg  50 mg Oral QHS PRN Roselind Messier, NP   50 mg at 07/16/20 2149  . hydrocortisone (ANUSOL-HC) suppository 25 mg  25 mg Rectal BID PRN 2150, NP      . magnesium hydroxide (MILK OF MAGNESIA) suspension 30 mL  30 mL Oral Daily PRN Charm Rings, NP      .  prenatal multivitamin tablet 1 tablet  1 tablet Oral Daily Charm Rings, NP   1 tablet at 07/17/20 [phone]  . sertraline (ZOLOFT) tablet 50 mg  50 mg Oral Daily Roselind Messier, MD   50 mg at 07/17/20 0827  . traZODone (DESYREL) tablet 100 mg  100 mg Oral QHS Roselind Messier, MD   100 mg at 07/16/20 2149  . witch hazel-glycerin (TUCKS) pad   Topical PRN Charm Rings, NP   Given at 07/15/20 2135    Lab Results:  Results for orders placed or performed during the hospital encounter of 07/15/20 (from the past 48 hour(s))  Glucose, capillary     Status: None   Collection Time: 07/16/20  8:28 AM  Result Value Ref Range   Glucose-Capillary 97 70 - 99 mg/dL    Comment: Glucose reference range applies only to samples taken after fasting for at least 8 hours.    Blood Alcohol level:   Lab Results  Component Value Date   ETH 131 (H) 07/15/2020   ETH 89 (H) 06/02/2020    Metabolic Disorder Labs: Lab Results  Component Value Date   HGBA1C 5.5 06/05/2020   MPG 111.15 06/05/2020   MPG 99.67 01/02/2018   No results found for: PROLACTIN Lab Results  Component Value Date   CHOL 218 (H) 01/02/2018   TRIG 187 (H) 01/02/2018   HDL 81 01/02/2018   CHOLHDL 2.7 01/02/2018   VLDL 37 01/02/2018   LDLCALC 100 (H) 01/02/2018   LDLCALC 82 01/12/2016     Past Psychiatric History:   Extensive ER and inpatient visits especially during pregnancy  No recent drug rehab    Past Medical/Surgical History  Past Medical History:  Diagnosis Date  . Anxiety   . Depression   . Gestational diabetes   . History of substance abuse (HCC)   . History of suicide attempt   . History of thyroid disease   . Hypertension   . Thyroid disease     Past Surgical History:  Procedure Laterality Date  . CESAREAN SECTION  2000, 2002, 2013  . WISDOM TOOTH EXTRACTION      Family Medical and Psychiatric History:   Family History  Problem Relation Age of Onset  . Diabetes Mother     Social History "Additional/Addendum":   Dad and sister cannot give her dollars for [facility] shelter she is not sure whether to keep child or if DSS will come and take child --she does not know whereabouts of her two kids in DSS --no clear communication she says or court date   Social History   Substance and Sexual Activity  Alcohol Use Not Currently   Comment: social drinker     Social History   Substance and Sexual Activity  Drug Use Not Currently  . Types: Cocaine, Marijuana   Comment: Positive UDS 12/2017    Social History   Socioeconomic History  . Marital status: Single    Spouse name: Not on file  . Number of children: Not on file  . Years of education: Not on file  . Highest education level: Not on file  Occupational History  . Not on file  Tobacco Use  . Smoking  status: Former Smoker    Packs/day: 0.50  . Smokeless tobacco: Never Used  Substance and Sexual Activity  . Alcohol use: Not Currently    Comment: social drinker  . Drug use: Not Currently    Types: Cocaine, Marijuana    Comment:  Positive UDS 12/2017  . Sexual activity: Not Currently    Birth control/protection: None  Other Topics Concern  . Not on file  Social History Narrative  . Not on file   Social Determinants of Health   Financial Resource Strain:   . Difficulty of Paying Living Expenses:   Food Insecurity:   . Worried About Programme researcher, broadcasting/film/video in the Last Year:   . Barista in the Last Year:   Transportation Needs:   . Freight forwarder (Medical):   Marland Kitchen Lack of Transportation (Non-Medical):   Physical Activity:   . Days of Exercise per Week:   . Minutes of Exercise per Session:   Stress:   . Feeling of Stress :   Social Connections:   . Frequency of Communication with Friends and Family:   . Frequency of Social Gatherings with Friends and Family:   . Attends Religious Services:   . Active Member of Clubs or Organizations:   . Attends Banker Meetings:   Marland Kitchen Marital Status:                            Summary:    AA female complex psycho social problems revolving door admits  33 weeks or so gestation --who is readmitted for same  Problems ---including bipolar issues, anxiety, personality disorder and issues surrounding birth   Needs at least 7-20 days again to straighten matters out   Did comb of treatment team and individual today   With many interventions for support        Prescription Plan/Interventions:     Continues with plan for Abilify cogentin, zoloft trazodone   Await OB GYN and medicine consult     Estimated Length of Stay:    7-10 days    Roselind Messier, MD 07/17/2020, 3:06 PM  Total Time spent with patient:   Greater than 20-30 min

## 2020-07-17 NOTE — Plan of Care (Signed)
Pt rates anxiety 7/10. Pt denies depression, SI, HI and AVH. Pt was educated on care plan and verbalizes understanding. Torrie Mayers RN Problem: Education: Goal: Knowledge of disease or condition will improve Outcome: Progressing Goal: Understanding of discharge needs will improve Outcome: Progressing   Problem: Health Behavior/Discharge Planning: Goal: Ability to identify changes in lifestyle to reduce recurrence of condition will improve Outcome: Progressing Goal: Identification of resources available to assist in meeting health care needs will improve Outcome: Progressing   Problem: Physical Regulation: Goal: Complications related to the disease process, condition or treatment will be avoided or minimized Outcome: Progressing   Problem: Safety: Goal: Ability to remain free from injury will improve Outcome: Progressing   Problem: Education: Goal: Knowledge of [facility] Education information/materials will improve Outcome: Progressing Goal: Emotional status will improve Outcome: Progressing Goal: Mental status will improve Outcome: Progressing Goal: Verbalization of understanding the information provided will improve Outcome: Progressing   Problem: Activity: Goal: Interest or engagement in activities will improve Outcome: Progressing Goal: Sleeping patterns will improve Outcome: Progressing   Problem: Coping: Goal: Ability to verbalize frustrations and anger appropriately will improve Outcome: Progressing Goal: Ability to demonstrate self-control will improve Outcome: Progressing   Problem: Health Behavior/Discharge Planning: Goal: Identification of resources available to assist in meeting health care needs will improve Outcome: Progressing Goal: Compliance with treatment plan for underlying cause of condition will improve Outcome: Progressing   Problem: Physical Regulation: Goal: Ability to maintain clinical measurements within normal limits will  improve Outcome: Progressing   Problem: Safety: Goal: Periods of time without injury will increase Outcome: Progressing   Problem: Education: Goal: Ability to state activities that reduce stress will improve Outcome: Progressing   Problem: Coping: Goal: Ability to identify and develop effective coping behavior will improve Outcome: Progressing   Problem: Self-Concept: Goal: Ability to identify factors that promote anxiety will improve Outcome: Progressing Goal: Level of anxiety will decrease Outcome: Progressing Goal: Ability to modify response to factors that promote anxiety will improve Outcome: Progressing   Problem: Education: Goal: Utilization of techniques to improve thought processes will improve Outcome: Progressing Goal: Knowledge of the prescribed therapeutic regimen will improve Outcome: Progressing   Problem: Activity: Goal: Interest or engagement in leisure activities will improve Outcome: Progressing Goal: Imbalance in normal sleep/wake cycle will improve Outcome: Progressing   Problem: Coping: Goal: Coping ability will improve Outcome: Progressing Goal: Will verbalize feelings Outcome: Progressing   Problem: Health Behavior/Discharge Planning: Goal: Ability to make decisions will improve Outcome: Progressing Goal: Compliance with therapeutic regimen will improve Outcome: Progressing   Problem: Role Relationship: Goal: Will demonstrate positive changes in social behaviors and relationships Outcome: Progressing   Problem: Safety: Goal: Ability to disclose and discuss suicidal ideas will improve Outcome: Progressing Goal: Ability to identify and utilize support systems that promote safety will improve Outcome: Progressing   Problem: Self-Concept: Goal: Will verbalize positive feelings about self Outcome: Progressing Goal: Level of anxiety will decrease Outcome: Progressing

## 2020-07-17 NOTE — Progress Notes (Signed)
Spoke with Tyler Aas, shift coordinator on 3rd floor. Gave her number to [facility], said she would contact them. Explained patient was [redacted] weeks pregnant. Contracting since 1900, now feels like she is contracting every 8 minutes.

## 2020-07-17 NOTE — Progress Notes (Signed)
Patient is pleasant and easy to engage. She is active on the unit and appears to get along well with others on the unit. She attended group and was an active participant. She received her prescribed meds and tolerated without inicident.  She can at times present with attention seeking behaviors, but is easily redirected. She remains safe on the nit with 15 minute safety checks and informed to contact staff with any concerns.    Cleo Butler-Nicholson, LPN

## 2020-07-17 NOTE — Discharge Summary (Signed)
[name] is a 34 y.o. female. She is at [redacted]w[redacted]d gestation. Patient's last menstrual period was 12/04/2019 (within months). Estimated Date of Delivery: 09/10/20  Prenatal care site: No PNC- has had NOB labs done here 04/2020 and at [facility]- repeat labs done with 1hr GTT 06/2020  Current pregnancy complicated by:  1. Poly-substance use including cocaine, meth, MJ, ETOH 2. GDMA1 per pt, no checking sugars except when inpt  3. No prenatal care this pregnancy 4. Hemorrhoids- tucks 5. Current pregnancy: fetal echo with mild to mod tricuspid valve regurg, recommended repeat in 4-6weeks.  6. Multiple inpatient admissions for suicidal ideation and substance use/intoxication this pregnancy 7. Prior CS x 5, 04/24/99, Apr 30, [id number] G1: Apr 24, 1999: [redacted]w[redacted]d; multiple anomalies, IUGR, breech, deceased [facility]);  G2: 2001-04-23: [redacted]w[redacted]d; failure to progress in first stage [facility]) G3: 04/23/12: [redacted]w[redacted]d; repeat [facility]) G4: repeat CS- [facility] Apr 24, 2015 G5: SAB 04-23-2016 G6: 04/23/2018 LTCS at [facility]     Chief complaint:abdominal pain like contractions  Location: lower abd every Onset/timing: started this evening around 9pm Duration: lasts for few seconds.  Quality: tightening Severity: 10/10 pain Aggravating or alleviating conditions: see preg complications listed above.  Associated signs/symptoms:  Context: currently admitted for ETOH, cocaine, and suicidal ideation. Had trazodone this evening prior to arrival on L&D  S: Resting comfortably. no VB.no LOF,  Active fetal movement. Denies: HA, visual changes, SOB, or RUQ/epigastric pain  Maternal Medical History:   Past Medical History:  Diagnosis Date  . Anxiety   . Depression   . Gestational diabetes   . History of substance abuse (HCC)   . History of suicide attempt   . History of thyroid disease   . Hypertension   . Thyroid disease     Past Surgical History:  Procedure Laterality Date  . CESAREAN SECTION  04/24/1999, 2002, 2013  . WISDOM TOOTH EXTRACTION       Allergies  Allergen Reactions  . Aspirin Anaphylaxis, Diarrhea and Nausea And Vomiting  . Banana Anaphylaxis, Itching, Shortness Of Breath and Swelling  . Other Itching, Shortness Of Breath and Swelling  . Peanut Oil Itching, Shortness Of Breath and Swelling  . Peanut-Containing Drug Products Shortness Of Breath, Itching and Swelling  . Pecan Nut (Diagnostic) Anaphylaxis    Prior to Admission medications   Medication Sig Start Date End Date Taking? Authorizing Provider  Prenatal Vit-Fe Fumarate-FA (PRENATAL MULTIVITAMIN) TABS tablet Take 1 tablet by mouth daily at 12 noon.   Yes [provider]  sertraline (ZOLOFT) 50 MG tablet Take 50 mg by mouth daily.   Yes [provider]      Social History: She  reports that she has quit smoking. She smoked 0.50 packs per day. She has never used smokeless tobacco. She reports previous alcohol use. She reports previous drug use. Drugs: Cocaine and Marijuana.  Family History: family history includes Diabetes in her mother.   Review of Systems: A full review of systems was performed and negative except as noted in the HPI.     O:  BP (!) 104/47 (BP Location: Right Arm)   Pulse 65   Temp 98.5 F (36.9 C) (Oral)   Resp 18   Ht 4\' 11"  (1.499 m)   Wt 75.8 kg   LMP 12/04/2019 (Within Months)   SpO2 99%   BMI 33.73 kg/m  Results for orders placed or performed during the hospital encounter of 07/17/20 (from the past 48 hour(s))  Wet prep, genital   Collection Time:  07/17/20 11:21 PM  Result Value Ref Range   Yeast Wet Prep HPF POC NONE SEEN NONE SEEN   Trich, Wet Prep NONE SEEN NONE SEEN   Clue Cells Wet Prep HPF POC NONE SEEN NONE SEEN   WBC, Wet Prep HPF POC FEW (A) NONE SEEN   Sperm NONE SEEN   Chlamydia/NGC rt PCR ([facility] only)   Collection Time: 07/17/20 11:21 PM  Result Value Ref Range   Specimen source GC/Chlam ENDOCERVICAL    Chlamydia Tr NOT DETECTED NOT DETECTED   N gonorrhoeae NOT DETECTED NOT DETECTED   Urinalysis, Complete w Microscopic   Collection Time: 07/17/20 11:21 PM  Result Value Ref Range   Color, Urine STRAW (A) YELLOW   APPearance CLEAR (A) CLEAR   Specific Gravity, Urine 1.006 1.005 - 1.030   pH 6.0 5.0 - 8.0   Glucose, UA NEGATIVE NEGATIVE mg/dL   Hgb urine dipstick NEGATIVE NEGATIVE   Bilirubin Urine NEGATIVE NEGATIVE   Ketones, ur NEGATIVE NEGATIVE mg/dL   Protein, ur NEGATIVE NEGATIVE mg/dL   Nitrite NEGATIVE NEGATIVE   Leukocytes,Ua NEGATIVE NEGATIVE   RBC / HPF 0-5 0 - 5 RBC/hpf   WBC, UA 0-5 0 - 5 WBC/hpf   Bacteria, UA RARE (A) NONE SEEN   Squamous Epithelial / LPF 0-5 0 - 5  Glucose, capillary   Collection Time: 07/17/20 11:48 PM  Result Value Ref Range   Glucose-Capillary 92 70 - 99 mg/dL     Constitutional: NAD, awakens to verbal stimulation.  HE/ENT: extraocular movements grossly intact, moist mucous membranes CV: RRR PULM: nl respiratory effort, CTABL     Abd: gravid, non-tender, non-distended, soft. Unable to determine fetal presentation by Le[place]. No fundal or incisional tenderness.       Ext: Non-tender, Nonedematous   Psych: mood appropriate, speech normal Pelvic: foul smelling vaginal discharge noted, GC/CT wet prep and FFN collected  Fetal  monitoring: Cat II Appropriate for GA Baseline: 160bpm  Variability: moderate Accelerations: present x >2 10*10  Decelerations absent  TOCO: no UCs noted, abd palpates soft.    A/P: 34 y.o. [redacted]w[redacted]d here for antenatal surveillance for abdominal pain in pregnancy.   Principle Diagnosis:  Abdominal pain at 32wks   Preterm labor: not present.   Fetal Wellbeing: US Ob limited requested for BPP and AFI due to tachycardia with persistent baseline 160bpm. Called [facility] to confirm pt medications, she was given trazodone at bedtime prior to being admitted to L&D triage tonight.   BPP 8/8, fetus cephalic presentation, AFI 11.4cm, anterior placenta  LR bolus given via IV.   Negative wet prep and UA, GC/CT:  negx2  GDMA1: no prenatal care, dx at [facility] and given insulin to take at home but pt has not taken insulin. Has no diet information.   Hx CS x 5 but plans to deliver here at [facility], has not established care. Encouraged to see [facility] as soon as she is DC home from behavioral health.   Polysubstance use: encouraged cessation, reviewed risks to fetus with ongoing use. Would strongly suggest delivery at [facility] due to well established mental health and substance use program for pregnant and postpartum patients.   Pt dc back to behavioral health   Randa Ngo, CNM 07/18/2020  9:23 AM

## 2020-07-17 NOTE — Progress Notes (Signed)
Patient complained of her abdomen feeling hard at shift change. At around 2140 patient reported having what felt like contractions that she had timed as being 8 minutes apart. The pain was to the point she could not finish her snack. She denies any leakage or discharge. Says she has been feeling the baby moving. Patient is [redacted] weeks pregnant with baby number six, and all of her deliveries have been C-sections. Patient was admitted to [facility] for abusing crack and has been getting no prenatal care other that what she receives during her frequent admissions to BMU. She is a voluntary patient, denies SI, HI and AVH and can contract for safety. AC Annice Pih was notified and Lerry Liner, NP. Patient was transferred to labor and delivery by wheelchair in no acute distress.

## 2020-07-18 ENCOUNTER — Inpatient Hospital Stay
Admission: RE | Admit: 2020-07-18 | Discharge: 2020-07-30 | DRG: 832 | Disposition: A | Discharge status: Other Acute Inpatient Hospital | Payer: Medicaid Other | Source: Intra-hospital | Attending: Psychiatry | Admitting: Psychiatry

## 2020-07-18 ENCOUNTER — Encounter: Payer: Self-pay | Admitting: Internal Medicine

## 2020-07-18 ENCOUNTER — Observation Stay: Payer: Medicaid Other

## 2020-07-18 ENCOUNTER — Other Ambulatory Visit: Payer: Self-pay

## 2020-07-18 DIAGNOSIS — O10913 Unspecified pre-existing hypertension complicating pregnancy, third trimester: Secondary | ICD-10-CM | POA: Diagnosis present

## 2020-07-18 DIAGNOSIS — O10919 Unspecified pre-existing hypertension complicating pregnancy, unspecified trimester: Secondary | ICD-10-CM | POA: Diagnosis present

## 2020-07-18 DIAGNOSIS — F1990 Other psychoactive substance use, unspecified, uncomplicated: Secondary | ICD-10-CM

## 2020-07-18 DIAGNOSIS — A6009 Herpesviral infection of other urogenital tract: Secondary | ICD-10-CM | POA: Diagnosis present

## 2020-07-18 DIAGNOSIS — Z8759 Personal history of other complications of pregnancy, childbirth and the puerperium: Secondary | ICD-10-CM

## 2020-07-18 DIAGNOSIS — O26893 Other specified pregnancy related conditions, third trimester: Secondary | ICD-10-CM | POA: Diagnosis present

## 2020-07-18 DIAGNOSIS — O99322 Drug use complicating pregnancy, second trimester: Secondary | ICD-10-CM

## 2020-07-18 DIAGNOSIS — O99519 Diseases of the respiratory system complicating pregnancy, unspecified trimester: Secondary | ICD-10-CM | POA: Diagnosis present

## 2020-07-18 DIAGNOSIS — K219 Gastro-esophageal reflux disease without esophagitis: Secondary | ICD-10-CM | POA: Diagnosis present

## 2020-07-18 DIAGNOSIS — F1910 Other psychoactive substance abuse, uncomplicated: Secondary | ICD-10-CM | POA: Diagnosis present

## 2020-07-18 DIAGNOSIS — O24414 Gestational diabetes mellitus in pregnancy, insulin controlled: Secondary | ICD-10-CM | POA: Diagnosis present

## 2020-07-18 DIAGNOSIS — O98313 Other infections with a predominantly sexual mode of transmission complicating pregnancy, third trimester: Secondary | ICD-10-CM | POA: Diagnosis present

## 2020-07-18 DIAGNOSIS — O99340 Other mental disorders complicating pregnancy, unspecified trimester: Secondary | ICD-10-CM | POA: Diagnosis present

## 2020-07-18 DIAGNOSIS — F411 Generalized anxiety disorder: Secondary | ICD-10-CM | POA: Diagnosis present

## 2020-07-18 DIAGNOSIS — O99323 Drug use complicating pregnancy, third trimester: Secondary | ICD-10-CM | POA: Diagnosis present

## 2020-07-18 DIAGNOSIS — O99320 Drug use complicating pregnancy, unspecified trimester: Secondary | ICD-10-CM | POA: Diagnosis present

## 2020-07-18 DIAGNOSIS — O99343 Other mental disorders complicating pregnancy, third trimester: Principal | ICD-10-CM | POA: Diagnosis present

## 2020-07-18 DIAGNOSIS — O0933 Supervision of pregnancy with insufficient antenatal care, third trimester: Secondary | ICD-10-CM

## 2020-07-18 DIAGNOSIS — F1920 Other psychoactive substance dependence, uncomplicated: Secondary | ICD-10-CM | POA: Diagnosis not present

## 2020-07-18 DIAGNOSIS — O09213 Supervision of pregnancy with history of pre-term labor, third trimester: Secondary | ICD-10-CM

## 2020-07-18 DIAGNOSIS — F17210 Nicotine dependence, cigarettes, uncomplicated: Secondary | ICD-10-CM | POA: Diagnosis present

## 2020-07-18 DIAGNOSIS — O99353 Diseases of the nervous system complicating pregnancy, third trimester: Secondary | ICD-10-CM | POA: Diagnosis present

## 2020-07-18 DIAGNOSIS — O09529 Supervision of elderly multigravida, unspecified trimester: Secondary | ICD-10-CM

## 2020-07-18 DIAGNOSIS — O99613 Diseases of the digestive system complicating pregnancy, third trimester: Secondary | ICD-10-CM | POA: Diagnosis present

## 2020-07-18 DIAGNOSIS — R102 Pelvic and perineal pain: Secondary | ICD-10-CM | POA: Diagnosis present

## 2020-07-18 DIAGNOSIS — F332 Major depressive disorder, recurrent severe without psychotic features: Secondary | ICD-10-CM | POA: Diagnosis not present

## 2020-07-18 DIAGNOSIS — F319 Bipolar disorder, unspecified: Secondary | ICD-10-CM | POA: Diagnosis not present

## 2020-07-18 DIAGNOSIS — G4700 Insomnia, unspecified: Secondary | ICD-10-CM | POA: Diagnosis present

## 2020-07-18 DIAGNOSIS — Z8616 Personal history of COVID-19: Secondary | ICD-10-CM | POA: Diagnosis not present

## 2020-07-18 DIAGNOSIS — O365990 Maternal care for other known or suspected poor fetal growth, unspecified trimester, not applicable or unspecified: Secondary | ICD-10-CM

## 2020-07-18 DIAGNOSIS — Z3A33 33 weeks gestation of pregnancy: Secondary | ICD-10-CM

## 2020-07-18 DIAGNOSIS — O24410 Gestational diabetes mellitus in pregnancy, diet controlled: Secondary | ICD-10-CM

## 2020-07-18 DIAGNOSIS — O365930 Maternal care for other known or suspected poor fetal growth, third trimester, not applicable or unspecified: Secondary | ICD-10-CM

## 2020-07-18 DIAGNOSIS — O09219 Supervision of pregnancy with history of pre-term labor, unspecified trimester: Secondary | ICD-10-CM

## 2020-07-18 DIAGNOSIS — O09299 Supervision of pregnancy with other poor reproductive or obstetric history, unspecified trimester: Secondary | ICD-10-CM

## 2020-07-18 DIAGNOSIS — O99513 Diseases of the respiratory system complicating pregnancy, third trimester: Secondary | ICD-10-CM | POA: Diagnosis present

## 2020-07-18 DIAGNOSIS — O99333 Smoking (tobacco) complicating pregnancy, third trimester: Secondary | ICD-10-CM | POA: Diagnosis present

## 2020-07-18 DIAGNOSIS — O09293 Supervision of pregnancy with other poor reproductive or obstetric history, third trimester: Secondary | ICD-10-CM | POA: Diagnosis not present

## 2020-07-18 DIAGNOSIS — Z79899 Other long term (current) drug therapy: Secondary | ICD-10-CM

## 2020-07-18 DIAGNOSIS — O99810 Abnormal glucose complicating pregnancy: Secondary | ICD-10-CM | POA: Diagnosis present

## 2020-07-18 DIAGNOSIS — O98319 Other infections with a predominantly sexual mode of transmission complicating pregnancy, unspecified trimester: Secondary | ICD-10-CM | POA: Diagnosis present

## 2020-07-18 DIAGNOSIS — J45909 Unspecified asthma, uncomplicated: Secondary | ICD-10-CM | POA: Diagnosis present

## 2020-07-18 DIAGNOSIS — O0990 Supervision of high risk pregnancy, unspecified, unspecified trimester: Secondary | ICD-10-CM

## 2020-07-18 DIAGNOSIS — O09523 Supervision of elderly multigravida, third trimester: Secondary | ICD-10-CM

## 2020-07-18 DIAGNOSIS — Z596 Low income: Secondary | ICD-10-CM

## 2020-07-18 DIAGNOSIS — R45851 Suicidal ideations: Secondary | ICD-10-CM | POA: Diagnosis present

## 2020-07-18 DIAGNOSIS — O24419 Gestational diabetes mellitus in pregnancy, unspecified control: Secondary | ICD-10-CM

## 2020-07-18 LAB — CHLAMYDIA/NGC RT PCR (ARMC ONLY)
Chlamydia Tr: NOT DETECTED
N gonorrhoeae: NOT DETECTED

## 2020-07-18 MED ORDER — CYCLOBENZAPRINE HCL 10 MG PO TABS
5.0000 mg | ORAL_TABLET | Freq: Three times a day (TID) | ORAL | Status: DC | PRN
  Administered 2020-07-29 (×2): 5 mg via ORAL
  Filled 2020-07-18 (×2): qty 1

## 2020-07-18 MED ORDER — ALUM & MAG HYDROXIDE-SIMETH 200-200-20 MG/5ML PO SUSP
30.0000 mL | ORAL | Status: DC | PRN
  Administered 2020-07-24 – 2020-07-29 (×2): 30 mL via ORAL
  Filled 2020-07-18 (×3): qty 30

## 2020-07-18 MED ORDER — DIPHENHYDRAMINE HCL 25 MG PO CAPS
50.0000 mg | ORAL_CAPSULE | Freq: Four times a day (QID) | ORAL | Status: DC | PRN
  Administered 2020-07-18 – 2020-07-29 (×9): 50 mg via ORAL
  Filled 2020-07-18 (×9): qty 2

## 2020-07-18 MED ORDER — ARIPIPRAZOLE 5 MG PO TABS
5.0000 mg | ORAL_TABLET | Freq: Every day | ORAL | Status: DC
  Administered 2020-07-18 – 2020-07-29 (×12): 5 mg via ORAL
  Filled 2020-07-18 (×12): qty 1

## 2020-07-18 MED ORDER — TRAZODONE HCL 50 MG PO TABS
50.0000 mg | ORAL_TABLET | Freq: Every day | ORAL | Status: DC
  Administered 2020-07-18 – 2020-07-25 (×7): 50 mg via ORAL
  Filled 2020-07-18 (×7): qty 1

## 2020-07-18 MED ORDER — BENZTROPINE MESYLATE 1 MG PO TABS
0.5000 mg | ORAL_TABLET | Freq: Two times a day (BID) | ORAL | Status: DC
  Administered 2020-07-18 – 2020-07-20 (×4): 0.5 mg via ORAL
  Filled 2020-07-18 (×4): qty 1

## 2020-07-18 MED ORDER — SERTRALINE HCL 25 MG PO TABS
50.0000 mg | ORAL_TABLET | Freq: Every day | ORAL | Status: DC
  Administered 2020-07-18 – 2020-07-30 (×13): 50 mg via ORAL
  Filled 2020-07-18 (×13): qty 2

## 2020-07-18 MED ORDER — ACETAMINOPHEN 325 MG PO TABS
650.0000 mg | ORAL_TABLET | Freq: Four times a day (QID) | ORAL | Status: DC | PRN
  Administered 2020-07-18 – 2020-07-29 (×26): 650 mg via ORAL
  Filled 2020-07-18 (×26): qty 2

## 2020-07-18 MED ORDER — PRENATAL MULTIVITAMIN CH
1.0000 | ORAL_TABLET | Freq: Every day | ORAL | Status: DC
  Administered 2020-07-18 – 2020-07-30 (×12): 1 via ORAL
  Filled 2020-07-18 (×12): qty 1

## 2020-07-18 MED ORDER — MAGNESIUM HYDROXIDE 400 MG/5ML PO SUSP: 30.0000 mL | Freq: Every day | ORAL | Status: DC | PRN

## 2020-07-18 NOTE — Progress Notes (Signed)
Ultrasound at bedside

## 2020-07-18 NOTE — Progress Notes (Signed)
Patient ID: [name], female   DOB: 22-Apr-1986, 34 y.o.   MRN: [id number]     [facility] MD Progress Note Huey Romans, MD    Referring Physician:   07/18/2020 1:51 PM Patient Identification: [name] MRN:  [id number]  Medical Diagnosis:  Active Problems:   MDD (major depressive disorder), recurrent severe, without psychosis (HCC)   Vitals: Last menstrual period 12/04/2019.There is no height or weight on file to calculate BMI.   Psychiatric Diagnosis: <principal problem not specified>    DAILY NARRATIVE  She is about the same Less melodramatic  Trying to get her to read AA literature and addictive recovery  She is alone, and in psych social deprivation --meds started again after she had pre labor contractions   Many unanswered ---social issues and disposition problems for her   No income sources, relatives and all with DSS and CPS involved also --it is not even clear if she wil lkeep this child or not      Pertinent Psychiatric Protocols and Assessments: AIMS:  , ,  ,  ,       CIWA:      COWS:     PHYSICAL EXAM  Physical Exam  SYSTEMS REVIEW  Review of Systems  MENTAL STATUS: Pertinents Only  About the same Calmer oriented less acting out  Less moody and up and down Somewhat more cooperative Feels better with meals Still complains of ligament pain No active SI HI or plans Oriented times four Consciousness not clouded or fluctuant Concentration and attention okay No severe cocaine craving so far Contracts for safety for now    ADL's  Same   Sleep  Somewhat better   Cognition  Below average   Appetite  Higher   Movements  None    Psychomotor Activity:  No new change  strength & muscle tone:  No new change  gait & station:  No new change            Current Medications: Current Facility-Administered Medications  Medication Dose Route Frequency Provider Last  Rate Last Admin  . acetaminophen (TYLENOL) tablet 650 mg  650 mg Oral Q6H PRN Jearld Lesch, NP   650 mg at 07/18/20 0829  . alum & mag hydroxide-simeth (MAALOX/MYLANTA) 200-200-20 MG/5ML suspension 30 mL  30 mL Oral Q4H PRN Dixon, Rashaun M, NP      . ARIPiprazole (ABILIFY) tablet 5 mg  5 mg Oral QHS Roselind Messier, MD      . benztropine (COGENTIN) tablet 0.5 mg  0.5 mg Oral BID Roselind Messier, MD      . cyclobenzaprine (FLEXERIL) tablet 5 mg  5 mg Oral TID PRN Roselind Messier, MD      . diphenhydrAMINE (BENADRYL) capsule 50 mg  50 mg Oral Q6H PRN Dixon, Rashaun M, NP      . magnesium hydroxide (MILK OF MAGNESIA) suspension 30 mL  30 mL Oral Daily PRN Jearld Lesch, NP      . prenatal multivitamin tablet 1 tablet  1 tablet Oral Q1200 Dixon, Rashaun M, NP      . sertraline (ZOLOFT) tablet 50 mg  50 mg Oral Daily Lerry Liner M, NP   50 mg at 07/18/20 2694  . traZODone (DESYREL) tablet 50 mg  50 mg Oral QHS Roselind Messier, MD        Lab Results:  Results for orders placed or performed during the hospital encounter of 07/17/20 (from the past 48  hour(s))  Urinalysis, Complete w Microscopic     Status: Abnormal   Collection Time: 07/17/20 11:21 PM  Result Value Ref Range   Color, Urine STRAW (A) YELLOW   APPearance CLEAR (A) CLEAR   Specific Gravity, Urine 1.006 1.005 - 1.030   pH 6.0 5.0 - 8.0   Glucose, UA NEGATIVE NEGATIVE mg/dL   Hgb urine dipstick NEGATIVE NEGATIVE   Bilirubin Urine NEGATIVE NEGATIVE   Ketones, ur NEGATIVE NEGATIVE mg/dL   Protein, ur NEGATIVE NEGATIVE mg/dL   Nitrite NEGATIVE NEGATIVE   Leukocytes,Ua NEGATIVE NEGATIVE   RBC / HPF 0-5 0 - 5 RBC/hpf   WBC, UA 0-5 0 - 5 WBC/hpf   Bacteria, UA RARE (A) NONE SEEN   Squamous Epithelial / LPF 0-5 0 - 5    Comment: Performed at [facility], [address] [place], [place] [address]  Wet prep, genital     Status: Abnormal   Collection Time: 07/17/20 11:21 PM  Result Value Ref Range   Yeast Wet  Prep HPF POC NONE SEEN NONE SEEN   Trich, Wet Prep NONE SEEN NONE SEEN   Clue Cells Wet Prep HPF POC NONE SEEN NONE SEEN   WBC, Wet Prep HPF POC FEW (A) NONE SEEN   Sperm NONE SEEN     Comment: Performed at [facility], [address] [place], [place] [address]  Chlamydia/NGC rt PCR [facility] only)     Status: None   Collection Time: 07/17/20 11:21 PM  Result Value Ref Range   Specimen source GC/Chlam ENDOCERVICAL    Chlamydia Tr NOT DETECTED NOT DETECTED   N gonorrhoeae NOT DETECTED NOT DETECTED    Comment: (NOTE) This CT/NG assay has not been evaluated in patients with a history of  hysterectomy. Performed at [facility], [address] [place], [place] [address]   Glucose, capillary     Status: None   Collection Time: 07/17/20 11:48 PM  Result Value Ref Range   Glucose-Capillary 92 70 - 99 mg/dL    Comment: Glucose reference range applies only to samples taken after fasting for at least 8 hours.    Blood Alcohol level:  Lab Results  Component Value Date   ETH 131 (H) 07/15/2020   ETH 89 (H) 06/02/2020    Metabolic Disorder Labs: Lab Results  Component Value Date   HGBA1C 5.5 06/05/2020   MPG 111.15 06/05/2020   MPG 99.67 01/02/2018   No results found for: PROLACTIN Lab Results  Component Value Date   CHOL 218 (H) 01/02/2018   TRIG 187 (H) 01/02/2018   HDL 81 01/02/2018   CHOLHDL 2.7 01/02/2018   VLDL 37 01/02/2018   LDLCALC 100 (H) 01/02/2018   LDLCALC 82 01/12/2016     Past Psychiatric History:   Past Medical/Surgical History  Past Medical History:  Diagnosis Date  . Anxiety   . Depression   . Gestational diabetes   . History of substance abuse (HCC)   . History of suicide attempt   . History of thyroid disease   . Hypertension   . Thyroid disease     Past Surgical History:  Procedure Laterality Date  . CESAREAN SECTION  2000, 2002, 2013  . WISDOM TOOTH EXTRACTION      Family Medical and Psychiatric History:   Family  History  Problem Relation Age of Onset  . Diabetes Mother     Social History "Additional/Addendum":   Social History   Substance and Sexual Activity  Alcohol Use  Not Currently   Comment: social drinker     Social History   Substance and Sexual Activity  Drug Use Not Currently  . Types: Cocaine, Marijuana   Comment: Positive UDS 12/2017    Social History   Socioeconomic History  . Marital status: Single    Spouse name: Not on file  . Number of children: Not on file  . Years of education: Not on file  . Highest education level: Not on file  Occupational History  . Not on file  Tobacco Use  . Smoking status: Former Smoker    Packs/day: 0.50  . Smokeless tobacco: Never Used  Substance and Sexual Activity  . Alcohol use: Not Currently    Comment: social drinker  . Drug use: Not Currently    Types: Cocaine, Marijuana    Comment: Positive UDS 12/2017  . Sexual activity: Not Currently    Birth control/protection: None  Other Topics Concern  . Not on file  Social History Narrative  . Not on file   Social Determinants of Health   Financial Resource Strain:   . Difficulty of Paying Living Expenses:   Food Insecurity:   . Worried About Programme researcher, broadcasting/film/video in the Last Year:   . Barista in the Last Year:   Transportation Needs:   . Freight forwarder (Medical):   Marland Kitchen Lack of Transportation (Non-Medical):   Physical Activity:   . Days of Exercise per Week:   . Minutes of Exercise per Session:   Stress:   . Feeling of Stress :   Social Connections:   . Frequency of Communication with Friends and Family:   . Frequency of Social Gatherings with Friends and Family:   . Attends Religious Services:   . Active Member of Clubs or Organizations:   . Attends Banker Meetings:   Marland Kitchen Marital Status:                               Summary:   AA female 33-34 weeks pregnancy --came back from [facility] after being checked for prelabor  contractionis.  Awaits stabilization and p lacement minus drug use and all          Prescription Plan/Interventions:    Remains on HS Abilify 5  Cogentin 0.5 bid Trazodone 50 qhs And Zoloft 50 daily   PRN Flex ordered for bilateral leg ligament pain assoc with pregnancy   She remains on unit pending stabilzation and also possible 1/2 way house for Mom's with dependence, however it is challenging without resources        Estimated Length of Stay:  7-19 days    Roselind Messier, MD 07/18/2020, 1:51 PM  Total Time spent with patient: 25

## 2020-07-18 NOTE — Progress Notes (Signed)
Pt transferred back to [facility]. Labor precautions reviewed and pt verbalized understanding. Report given to [facility], [place].

## 2020-07-18 NOTE — BHH Counselor (Addendum)
Adult Comprehensive Assessment  Patient ID: [name], female   DOB: 1986/08/16, 34 y.o.   MRN: [id number]  Information Source: Information source: Patient  Current Stressors: Patient states their primary concerns and needs for treatment are::"I hate my house.  I hate being there so I came here" Patient states their goals for this hospitilization and ongoing recovery are::"find somewhere to go" Family Relationships: Pthas one child in DSS custody/foster care, two children living with his father (also through DSS) and two adult children. Pt currently pregnant with 6th child. Physical health (include injuries & life threatening diseases): Pt reports she is pregnant (32 weeks) Substance abuse: Pt reports cocaine and alcohol use. Bereavement:: Pt reports her mother died unexpectedly recently.  Living/Environment/Situation: Living Arrangements:lives alone Who else lives in the home?:live alone since mother died How long has patient lived in current situation?: 4 years What is atmosphere in current home: Chaotic  Family History: Marital status: Single Are you sexually active?: No What is your sexual orientation?: Heterosexual Does patient have children?: Yes How many children?:6 How is patient's relationship with their children?: Pt reports that she is pregnant with her 6th child.  Two minor children are with their fathers (aged 66 and 4), one minor child in DSS custody in [facility], other children are adults.   Childhood History: By whom was/is the patient raised?: Mother, Grandparents Additional childhood history information: Pt was born and raised in [place], [place]. Her parents were married, but separated when she was approximately 42-5 yo.  Description of patient's relationship with caregiver when they were a child: Pt was primarily raised by her mother and her mother's parents (she and her mother lived in their home). She shared that she was very close to her  grandparents.  Patient's description of current relationship with people who raised him/her:mother recently died, limited contact with father. How were you disciplined when you got in trouble as a child/adolescent?: Pt shared that she received occasional spanking from her mother Does patient have siblings?: Yes Number of Siblings: 1 Description of patient's current relationship with siblings: Pt has one older sister. She shared that she doesn't talk to her sister. Did patient suffer any verbal/emotional/physical/sexual abuse as a child?: No Did patient suffer from severe childhood neglect?: No Has patient ever been sexually abused/assaulted/raped as an adolescent or adult?: No Was the patient ever a victim of a crime or a disaster?: No Witnessed domestic violence?: No Has patient been effected by domestic violence as an adult?: No 06/05/20: no update to trauma history.  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  Pt denied amphetamine and cannabis use, however, UDS was positive.   If attempted suicide, did drugs/alcohol play a role in this?:  No Alcohol/Substance Abuse  Treatment Hx: Denies past history Has alcohol/substance abuse ever caused legal problems?: Yes(possession of cocaine "years ago")  Social Support System: Forensic psychologist System: None Describe Community Support System:"nobody" since her mother died Type of faith/religion: no How does patient's faith help to cope with current illness?: na  Leisure/Recreation: Leisure and Hobbies: "nothing"  Strengths/Needs: What is the patient's perception of their strengths?: cooking, being a mother Patient states they can use these personal strengths during their treatment to contribute to their recovery: when things are better with her kids, she is less depressed Patient states these barriers may affect/interfere with their treatment: none Patient states these barriers may affect their return to the community:transportation issues Other important information patient would like considered in planning for their treatment: no  Discharge Plan: Currently receiving community mental health services: Yes: current pt with [facility] Patient states concerns and preferences for aftercare planning are: Pt [facility] to [facility] with[facility]. Patient states they will know when they are safe and ready for discharge when: "I don't' know" Does patient have access to transportation?: No Does patient have financial barriers related to discharge medications?: No Plan for no access to transportation at discharge: CSW will assess for plan. Will patient be returning to same living situation after discharge?: Yes  Summary/Recommendations:   Summary and Recommendations (to be completed by the evaluator): Patient is a 34 year old female from [place], [place] [facility]   She presents to the hospital following suicidal ideations.  She has a primary diagnosis of Major Depressive Disorder, Severe, without psychotic features.  Recommendations include: crisis stabilization, therapeutic  milieu, encourage group attendance and participation, medication management for detox/mood stabilization and development of comprehensive mental wellness/sobriety plan.  This is an update to previous PSA. Patient was admitted to the [facility] LABOR AND DELIVERY unit due to reporting pain and contractions being 8 minutes apart but after evaluation was returned to the [facility] unit. Patient stressors include pregnancy, diabetes and CPS involvement. Patient identified wanting help with identifying what her DSS case plan is and connecting to resources including WIC, medicaid and EBT.

## 2020-07-18 NOTE — Progress Notes (Addendum)
Admission Note:  Pt was discharged on previous shift to L/D unit, was discharged from L/D unit and admitted back to BMU unit in the same night shift. Per HS Q15 minute round sheets, pt arrived back on the unit at 0230.   This Probation officer received pt at start of day shift at 07:00am. Report given to this Probation officer from [place], Therapist, sports. Per report from [place], [place] pt was discharged from the Labor and Delivery floor in stable condition, was given fluids and was having Braxton hicks contractions.   When this Probation officer met with pt this morning, pt is noted to be calm, cooperative, pleasant, c/o headache and lower abdominal pain, describes it as  the same pain pt was having on unit prior to d/c to L/D, intermittent abdominal cramping in her lower abdomen, similar to labor pains.   Pt denies suicidal and homicidal ideation, denies hallucinations, denies feelings of depression and anxiety, appetite is good. No distress noted, none reported, will continue to monitor pt per Q15 minute face checks, and monitor for safety and progress.

## 2020-07-18 NOTE — BHH Group Notes (Signed)
BHH Group Notes: (Clinical Social Work)   07/18/2020      Type of Therapy:  Group Therapy   Participation Level:  Did Not Attend despite MHT prompting   Shellia Cleverly, LCSW  07/18/2020 1:58 PM

## 2020-07-19 MED ORDER — ARIPIPRAZOLE ER 400 MG IM SRER
400.0000 mg | Freq: Once | INTRAMUSCULAR | Status: DC
  Filled 2020-07-19: qty 2

## 2020-07-19 NOTE — Progress Notes (Signed)
Sent alpha numeric text to Dr. Francena Hanly pager, requesting she contact behavioral medicine(put number in pager).Stated decreased fetal movement.

## 2020-07-19 NOTE — Progress Notes (Signed)
Recreation Therapy Notes  INPATIENT RECREATION THERAPY ASSESSMENT  Patient Details Name: [name] MRN: [id number] DOB: Dec 31, 1985 Today's Date: 07/19/2020       Information Obtained From: Patient  Able to Participate in Assessment/Interview: Yes  Patient Presentation: Responsive  Reason for Admission (Per Patient): Active Symptoms  Patient Stressors:    Coping Skills:   Music, Substance Abuse  Leisure Interests (2+):  Music - Listen, Art - Coloring (Cooking)  Frequency of Recreation/Participation:    Awareness of Community Resources:     [employer]:     Current Use:    If no, Barriers?:    Expressed Interest in [employer] Information:    [place] of Residence:  Film/video editor  Patient Main Form of Transportation: Therapist, music  Patient Strengths:  N/A  Patient Identified Areas of Improvement:  Not be depressed  Patient Goal for Hospitalization:  Finding somewhere to go  Current SI (including self-harm):  No  Current HI:  No  Current AVH: No  Staff Intervention Plan: Group Attendance, Collaborate with Interdisciplinary Treatment Team  Consent to Intern Participation: N/A  Jesper Stirewalt 07/19/2020, 2:27 PM

## 2020-07-19 NOTE — Progress Notes (Signed)
Patient stated that she did not feel the baby is moving since lunch time.Fluids offered and encouraged.Made Dr.Rao aware.

## 2020-07-19 NOTE — Progress Notes (Signed)
Spoke with Gigi. She stated MD had phoned back and LD would do testing. Spoke with Event organiser. She said she was aware and plans were in place to assess baby and fetal movements.

## 2020-07-19 NOTE — Plan of Care (Signed)
Patient appropriate with staff & peers.Denies SI,HI and AVH.Refused to Abilify injection.Patient is checked by OBG.No distress verbalized.Support and encouragement given.Will continue to monitor.

## 2020-07-19 NOTE — Progress Notes (Signed)
° °  Triage visit for NST   Arcenia Scarbro Massiah is a 34 y.o. [id number]. She is at [redacted]w[redacted]d gestation. She presents for a scheduled NST.  Indication: Inpatient behavioral health, substance abuse, suicidal ideation  S: Resting comfortably. no CTX, no VB. Concerned about decreased fetal movement today.  Current pregnancy complicated by:  1. Poly-substance use including cocaine, meth, MJ, ETOH 2. GDMA1 per pt, no checking sugars except when inpt  3. No prenatal care this pregnancy 4. Hemorrhoids- tucks 5. Current pregnancy: fetal echo with mild to mod tricuspid valve regurg, recommended repeat in 4-6weeks.  6. Multiple inpatient admissions for suicidal ideation and substance use/intoxication this pregnancy 7. Prior CS x 5, 04-29-99, 05/05/02May 05, 20132016/05/052019-05-05 G1: 04-29-99: [redacted]w[redacted]d; multiple anomalies, IUGR, breech, deceased [facility]);  G2: 2001-04-28: 103w4d; failure to progress in first stage [facility]) G3: Apr 28, 2012: [redacted]w[redacted]d; repeat ([facility]) G4: repeat CS- [facility] 2015/04/29 G5: SAB 04/28/16 G6: 04/23/2018 [facility] at [facility]  O:  BP 107/66 (BP Location: Right Arm)    Pulse 67    Temp 98 F (36.7 C) (Oral)    Resp 18    Ht 4\' 11"  (1.499 m)    Wt 76 kg    LMP 12/04/2019 (Within Months)    SpO2 100%    BMI 33.84 kg/m  Results for orders placed or performed during the hospital encounter of 07/17/20 (from the past 48 hour(s))  Wet prep, genital   Collection Time: 07/17/20 11:21 PM  Result Value Ref Range   Yeast Wet Prep HPF POC NONE SEEN NONE SEEN   Trich, Wet Prep NONE SEEN NONE SEEN   Clue Cells Wet Prep HPF POC NONE SEEN NONE SEEN   WBC, Wet Prep HPF POC FEW (A) NONE SEEN   Sperm NONE SEEN   Chlamydia/NGC rt PCR ([facility] only)   Collection Time: 07/17/20 11:21 PM  Result Value Ref Range   Specimen source GC/Chlam ENDOCERVICAL    Chlamydia Tr NOT DETECTED NOT DETECTED   N gonorrhoeae NOT DETECTED NOT DETECTED  Urinalysis, Complete w Microscopic   Collection Time: 07/17/20 11:21 PM  Result Value Ref Range   Color, Urine STRAW (A)  YELLOW   APPearance CLEAR (A) CLEAR   Specific Gravity, Urine 1.006 1.005 - 1.030   pH 6.0 5.0 - 8.0   Glucose, UA NEGATIVE NEGATIVE mg/dL   Hgb urine dipstick NEGATIVE NEGATIVE   Bilirubin Urine NEGATIVE NEGATIVE   Ketones, ur NEGATIVE NEGATIVE mg/dL   Protein, ur NEGATIVE NEGATIVE mg/dL   Nitrite NEGATIVE NEGATIVE   Leukocytes,Ua NEGATIVE NEGATIVE   RBC / HPF 0-5 0 - 5 RBC/hpf   WBC, UA 0-5 0 - 5 WBC/hpf   Bacteria, UA RARE (A) NONE SEEN   Squamous Epithelial / LPF 0-5 0 - 5  Glucose, capillary   Collection Time: 07/17/20 11:48 PM  Result Value Ref Range   Glucose-Capillary 92 70 - 99 mg/dL     Gen: NAD, AAOx3      Abd: FNTTP      Ext: Non-tender, Nonedmeatous    NST  Baseline: 150 Variability: moderate Accelerations present x >2, 10x10 Decelerations absent Time 07/19/20  Interpretation: reactive NST, category 1 tracing  A/P:  34 y.o. 31 [redacted]w[redacted]d with reactive NST.    Reactive NST, with moderate variability and accelerations, no decels  Fetal Wellbeing: Reassuring ----- [redacted]w[redacted]d, MD MPH Attending Obstetrician and Gynecologist [facility], Department of OB/GYN [facility]

## 2020-07-19 NOTE — Progress Notes (Signed)
Patient told that baby started moving.No issues verbalized.Dr.Beasley called back.

## 2020-07-19 NOTE — Progress Notes (Signed)
Recreation Therapy Notes  Date: 07/19/2020  Time: 9:30 am   Location: Craft room     Behavioral response: N/A   Intervention Topic: Relaxation   Discussion/Intervention: Patient did not attend group.   Clinical Observations/Feedback:  Patient did not attend group.   Cheria Sadiq LRT/CTRS        Christpher Stogsdill 07/19/2020 11:46 AM 

## 2020-07-19 NOTE — Progress Notes (Signed)
Patient ID: [name], female   DOB: 1986-09-03, 34 y.o.   MRN: [id number]    [name] MD  Psychiatry Note [name]   Brief nnote  She is somewhat calmer and more organized and has been receiving oral Abilify   Will give Abilify 400 maintena today   Unclear disposition and no clarity on payment for Half way house rehab for pregnant moms  Will check with CW  MS otherwise about the same Without active SI HI or plans

## 2020-07-19 NOTE — Progress Notes (Signed)
Patient is pleasant and easy to engage. She endorses higher than normal anxiety as she recently had consult with OBG to check status of her unborn child. She has complaint of lower back pain that she rated 7 on 0-10 scale. She received prescribed meds and tolerated without incident. She remains safe on the unit with 15 minute safety checks and informed to contact staff with any concerns.   Cleo Butler-Nicholson, LPN

## 2020-07-19 NOTE — Progress Notes (Signed)
Patient alert and oriented x 4, affect is blunted thoughts are organized and coherent. Patient's denies SI/HI/AVH interacting appropriately with peers and staff she c/o back pain and was medicated with tylenol PRN as needed  she was receptive to staff, 15 minutes safety checks maintained will continue to monitor °

## 2020-07-19 NOTE — Progress Notes (Signed)
RN preformed NST, Reactive and reassuring. Pt denies LOF, VB, & CTX. PT felt baby moving during NST. MD notified.

## 2020-07-19 NOTE — Plan of Care (Signed)
  Problem: Education: Goal: Knowledge of [facility] Education information/materials will improve Outcome: Progressing Goal: Emotional status will improve Outcome: Progressing Goal: Mental status will improve Outcome: Progressing Goal: Verbalization of understanding the information provided will improve Outcome: Progressing   Problem: Activity: Goal: Interest or engagement in activities will improve Outcome: Progressing Goal: Sleeping patterns will improve Outcome: Progressing   Problem: Coping: Goal: Ability to verbalize frustrations and anger appropriately will improve Outcome: Progressing Goal: Ability to demonstrate self-control will improve Outcome: Progressing   Problem: Health Behavior/Discharge Planning: Goal: Identification of resources available to assist in meeting health care needs will improve Outcome: Progressing Goal: Compliance with treatment plan for underlying cause of condition will improve Outcome: Progressing   Problem: Physical Regulation: Goal: Ability to maintain clinical measurements within normal limits will improve Outcome: Progressing   Problem: Safety: Goal: Periods of time without injury will increase Outcome: Progressing   Problem: Education: Goal: Utilization of techniques to improve thought processes will improve Outcome: Progressing Goal: Knowledge of the prescribed therapeutic regimen will improve Outcome: Progressing   Problem: Activity: Goal: Interest or engagement in leisure activities will improve Outcome: Progressing Goal: Imbalance in normal sleep/wake cycle will improve Outcome: Progressing   Problem: Coping: Goal: Coping ability will improve Outcome: Progressing Goal: Will verbalize feelings Outcome: Progressing   Problem: Health Behavior/Discharge Planning: Goal: Ability to make decisions will improve Outcome: Progressing Goal: Compliance with therapeutic regimen will improve Outcome: Progressing    Problem: Role Relationship: Goal: Will demonstrate positive changes in social behaviors and relationships Outcome: Progressing   Problem: Safety: Goal: Ability to disclose and discuss suicidal ideas will improve Outcome: Progressing Goal: Ability to identify and utilize support systems that promote safety will improve Outcome: Progressing   Problem: Self-Concept: Goal: Will verbalize positive feelings about self Outcome: Progressing Goal: Level of anxiety will decrease Outcome: Progressing   

## 2020-07-20 DIAGNOSIS — F1920 Other psychoactive substance dependence, uncomplicated: Secondary | ICD-10-CM

## 2020-07-20 MED ORDER — CALCIUM CARBONATE ANTACID 500 MG PO CHEW
1.0000 | CHEWABLE_TABLET | Freq: Three times a day (TID) | ORAL | Status: DC | PRN
  Filled 2020-07-20: qty 1

## 2020-07-20 NOTE — BHH Counselor (Signed)
CSW provided the patient with the contact information for her Care Coordinator.  Patient attempted to call, however, the call was unsuccessful.  Penni Homans, MSW, LCSW 07/20/2020 3:03 PM

## 2020-07-20 NOTE — Progress Notes (Signed)
   07/20/20 1400  Clinical Encounter Type  Visited With Patient  Visit Type Follow-up;Spiritual support;Social support;Behavioral Health  Referral From Chaplain  Consult/Referral To Chaplain  Pt came to group today. We talked about being happy. Pt did a lot of interjecting. Pt talked about her mother dying and it is still bother her. Ch will follow-up with Pt.

## 2020-07-20 NOTE — Progress Notes (Signed)
[facility] MD Progress Note  07/20/2020 12:23 PM [name]  MRN:  [id number] Subjective: Patient is a 34 year old female with a past psychiatric history significant for substance abuse, depression in pregnancy who was admitted on 07/16/2020.  Because of long-term substance abuse in her pregnancy.  Objective: Patient is seen and examined.  Patient is a 34 year old female familiar to me from previous admissions to this facility in the past.  She was apparently readmitted on 6/22.  Then discharged, then readmitted on 7/23.  She has a longstanding history of reported bipolar disorder, generalized anxiety, cocaine, marijuana and amphetamine dependence.  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  She stated she is upset this morning.  She stated that she cannot have what she wants.  She stated she wanted apple pie.  Apparently the midwives have come by and seen her, and she has a history of gestational diabetes.  Dietary changes were made, then the patient stated she is going on a hunger strike and will not eat anything or take her medications.  Her blood sugar this morning is 92.  Her last hemoglobin A1c was approximately 1 month ago and it was 5.5.  No evidence of suicidal or homicidal ideation.  No evidence of auditory or visual hallucinations.  No evidence of psychosis.  Vital signs are stable, she is afebrile.  She slept 7 hours last night.  Principal Problem: <principal problem not specified> Diagnosis: Active Problems:   MDD (major depressive disorder), recurrent severe, without psychosis (HCC)  Total Time spent with patient: 15 minutes  Past Psychiatric History: See admission H&P  Past Medical History:  Past Medical History:  Diagnosis Date  . Anxiety   . Depression   . Gestational diabetes   . History of substance abuse (HCC)   . History of suicide attempt   . History of thyroid disease   . Hypertension   .  Thyroid disease     Past Surgical History:  Procedure Laterality Date  . CESAREAN SECTION  2000, 2002, 2013  . WISDOM TOOTH EXTRACTION     Family History:  Family History  Problem Relation Age of Onset  . Diabetes Mother    Family Psychiatric  History: See admission H&P Social History:  Social History   Substance and Sexual Activity  Alcohol Use Not Currently   Comment: social drinker     Social History   Substance and Sexual Activity  Drug Use Not Currently  . Types: Cocaine, Marijuana   Comment: Positive UDS 12/2017    Social History   Socioeconomic History  . Marital status: Single    Spouse name: Not on file  . Number of children: Not on file  . Years of education: Not on file  . Highest education level: Not on file  Occupational History  . Not on file  Tobacco Use  . Smoking status: Former Smoker    Packs/day: 0.50  . Smokeless tobacco: Never Used  Substance and Sexual Activity  . Alcohol use: Not Currently    Comment: social drinker  . Drug use: Not Currently    Types: Cocaine, Marijuana    Comment: Positive UDS 12/2017  . Sexual activity: Not Currently    Birth control/protection: None  Other Topics Concern  . Not on file  Social History Narrative  . Not on file   Social Determinants of Health   Financial Resource Strain:   . Difficulty of Paying Living Expenses:  Food Insecurity:   . Worried About Programme researcher, broadcasting/film/video in the Last Year:   . Barista in the Last Year:   Transportation Needs:   . Freight forwarder (Medical):   Marland Kitchen Lack of Transportation (Non-Medical):   Physical Activity:   . Days of Exercise per Week:   . Minutes of Exercise per Session:   Stress:   . Feeling of Stress :   Social Connections:   . Frequency of Communication with Friends and Family:   . Frequency of Social Gatherings with Friends and Family:   . Attends Religious Services:   . Active Member of Clubs or Organizations:   . Attends Tax inspector Meetings:   Marland Kitchen Marital Status:    Additional Social History:                         Sleep: Fair  Appetite:  Good  Current Medications: Current Facility-Administered Medications  Medication Dose Route Frequency Provider Last Rate Last Admin  . acetaminophen (TYLENOL) tablet 650 mg  650 mg Oral Q6H PRN Jearld Lesch, NP   650 mg at 07/19/20 2206  . alum & mag hydroxide-simeth (MAALOX/MYLANTA) 200-200-20 MG/5ML suspension 30 mL  30 mL Oral Q4H PRN Dixon, Rashaun M, NP      . ARIPiprazole (ABILIFY) tablet 5 mg  5 mg Oral QHS Roselind Messier, MD   5 mg at 07/19/20 2200  . ARIPiprazole ER (ABILIFY MAINTENA) injection 400 mg  400 mg Intramuscular Once Roselind Messier, MD      . benztropine (COGENTIN) tablet 0.5 mg  0.5 mg Oral BID Roselind Messier, MD   0.5 mg at 07/20/20 0804  . cyclobenzaprine (FLEXERIL) tablet 5 mg  5 mg Oral TID PRN Roselind Messier, MD      . diphenhydrAMINE (BENADRYL) capsule 50 mg  50 mg Oral Q6H PRN Jearld Lesch, NP   50 mg at 07/18/20 2120  . magnesium hydroxide (MILK OF MAGNESIA) suspension 30 mL  30 mL Oral Daily PRN Jearld Lesch, NP      . prenatal multivitamin tablet 1 tablet  1 tablet Oral Q1200 Jearld Lesch, NP   1 tablet at 07/19/20 1150  . sertraline (ZOLOFT) tablet 50 mg  50 mg Oral Daily Dixon, Rashaun M, NP   50 mg at 07/20/20 0803  . traZODone (DESYREL) tablet 50 mg  50 mg Oral QHS Roselind Messier, MD   50 mg at 07/19/20 2200    Lab Results: No results found for this or any previous visit (from the past 48 hour(s)).  Blood Alcohol level:  Lab Results  Component Value Date   ETH 131 (H) 07/15/2020   ETH 89 (H) 06/02/2020    Metabolic Disorder Labs: Lab Results  Component Value Date   HGBA1C 5.5 06/05/2020   MPG 111.15 06/05/2020   MPG 99.67 01/02/2018   No results found for: PROLACTIN Lab Results  Component Value Date   CHOL 218 (H) 01/02/2018   TRIG 187 (H) 01/02/2018   HDL 81 01/02/2018   CHOLHDL  2.7 01/02/2018   VLDL 37 01/02/2018   LDLCALC 100 (H) 01/02/2018   LDLCALC 82 01/12/2016    Physical Findings: AIMS:  , ,  ,  ,    CIWA:    COWS:     Musculoskeletal: Strength & Muscle Tone: within normal limits Gait & Station: normal Patient leans: N/A  Psychiatric Specialty Exam: Physical Exam Vitals and  nursing note reviewed.  Constitutional:      Appearance: Normal appearance.  HENT:     Head: Normocephalic and atraumatic.  Pulmonary:     Effort: Pulmonary effort is normal.  Neurological:     General: No focal deficit present.     Mental Status: She is alert and oriented to person, place, and time.     Review of Systems  Blood pressure (!) 119/59, pulse 64, temperature (!) 97.5 F (36.4 C), temperature source Oral, resp. rate 18, height 4\' 11"  (1.499 m), weight 76 kg, last menstrual period 12/04/2019, SpO2 100 %.Body mass index is 33.84 kg/m.  General Appearance: Casual  Eye Contact:  Minimal  Speech:  Normal Rate  Volume:  Normal  Mood:  Irritable  Affect:  Congruent  Thought Process:  Coherent and Descriptions of Associations: Circumstantial  Orientation:  Full (Time, Place, and Person)  Thought Content:  Logical  Suicidal Thoughts:  No  Homicidal Thoughts:  No  Memory:  Immediate;   Poor Recent;   Poor Remote;   Poor  Judgement:  Impaired  Insight:  Lacking  Psychomotor Activity:  Normal  Concentration:  Concentration: Fair and Attention Span: Fair  Recall:  14/09/2019 of Knowledge:  Good  Language:  Good  Akathisia:  Negative  Handed:  Right  AIMS (if indicated):     Assets:  Desire for Improvement Resilience  ADL's:  Intact  Cognition:  WNL  Sleep:  Number of Hours: 7     Treatment Plan Summary: Daily contact with patient to assess and evaluate symptoms and progress in treatment, Medication management and Plan : Patient is seen and examined.  Patient is a 34 year old female with the above-stated past psychiatric history who is seen in  follow-up.   Diagnosis: 1.  Polysubstance dependence. 2.  Reported history of bipolar disorder. 3.  Personality disorder unspecified 4.  Gestational diabetes 5.  Intrauterine pregnancy at approximately 33 weeks  Pertinent findings on examination today: 1.  Irritable secondary to inability to have apple pie  Plan: 1.  Continue Abilify 5 mg p.o. nightly for mood stability. 2.  Long-acting Abilify injection 400 mg IM scheduled for 7/26 x 1 dose for mood stability. 3.  Continue Cogentin 0.5 mg p.o. twice daily for side effects of medication. 4.  Continue Flexeril 5 mg p.o. 3 times daily as needed muscle spasms. 5.  Change diet to carb modified. 6.  Continue sertraline 50 mg p.o. daily for depression and anxiety. 7.  Continue trazodone 50 mg p.o. nightly for insomnia. 8.  Disposition planning-in progress.  8/26, MD 07/20/2020, 12:23 PM

## 2020-07-20 NOTE — BHH Counselor (Signed)
CSW received call from pt Care Coordinator Morrie Sheldon [phone] requesting an update on the patient.  She requested that the patient give her a call to follow up.   Penni Homans, MSW, LCSW 07/20/2020 1:04 PM

## 2020-07-20 NOTE — Progress Notes (Signed)
Daily NST performed in behavioral med. Reactive NST obtained. Paper copy of EFM in chart. [name] viewed tracing. Pt states she is not having CBGs checked for GDM. [name], CNM informed. Plan to continue daily NSTs as ordered

## 2020-07-20 NOTE — Progress Notes (Signed)
Pt staes that she is "going on strike" from all meds and food. She is upset on the information from the midwife that her diet will be changed. Torrie Mayers RN

## 2020-07-20 NOTE — Progress Notes (Signed)
Recreation Therapy Notes  Date: 07/20/2020  Time: 9:30 am   Location: Craft room     Behavioral response: N/A   Intervention Topic: Communication   Discussion/Intervention: Patient did not attend group.   Clinical Observations/Feedback:  Patient did not attend group.   [name] LRT/CTRS        [name] 07/20/2020 12:37 PM

## 2020-07-20 NOTE — Plan of Care (Signed)
Pt rates depression 6/10. Pt denies anxiety, SI, HI, AVH. Pt was educated on care plan and verbalizes understanding. Pt encouraged to attend groups. Marie Mayers RN Problem: Education: Goal: Knowledge of [facility] Education information/materials will improve Outcome: Progressing Goal: Emotional status will improve Outcome: Progressing Goal: Mental status will improve Outcome: Progressing Goal: Verbalization of understanding the information provided will improve Outcome: Progressing   Problem: Activity: Goal: Interest or engagement in activities will improve Outcome: Progressing Goal: Sleeping patterns will improve Outcome: Progressing   Problem: Coping: Goal: Ability to verbalize frustrations and anger appropriately will improve Outcome: Progressing Goal: Ability to demonstrate self-control will improve Outcome: Progressing   Problem: Health Behavior/Discharge Planning: Goal: Identification of resources available to assist in meeting health care needs will improve Outcome: Progressing Goal: Compliance with treatment plan for underlying cause of condition will improve Outcome: Progressing   Problem: Physical Regulation: Goal: Ability to maintain clinical measurements within normal limits will improve Outcome: Progressing   Problem: Safety: Goal: Periods of time without injury will increase Outcome: Progressing   Problem: Education: Goal: Utilization of techniques to improve thought processes will improve Outcome: Progressing Goal: Knowledge of the prescribed therapeutic regimen will improve Outcome: Progressing   Problem: Activity: Goal: Interest or engagement in leisure activities will improve Outcome: Progressing Goal: Imbalance in normal sleep/wake cycle will improve Outcome: Progressing   Problem: Coping: Goal: Coping ability will improve Outcome: Progressing Goal: Will verbalize feelings Outcome: Progressing   Problem: Health Behavior/Discharge  Planning: Goal: Ability to make decisions will improve Outcome: Progressing Goal: Compliance with therapeutic regimen will improve Outcome: Progressing   Problem: Role Relationship: Goal: Will demonstrate positive changes in social behaviors and relationships Outcome: Progressing   Problem: Safety: Goal: Ability to disclose and discuss suicidal ideas will improve Outcome: Progressing Goal: Ability to identify and utilize support systems that promote safety will improve Outcome: Progressing   Problem: Self-Concept: Goal: Will verbalize positive feelings about self Outcome: Progressing Goal: Level of anxiety will decrease Outcome: Progressing

## 2020-07-21 MED ORDER — CALCIUM CARBONATE ANTACID 500 MG PO CHEW
1.0000 | CHEWABLE_TABLET | Freq: Two times a day (BID) | ORAL | Status: DC
  Administered 2020-07-21 – 2020-07-30 (×15): 200 mg via ORAL
  Filled 2020-07-21 (×15): qty 1

## 2020-07-21 NOTE — Progress Notes (Signed)
Patients received prescribed meds and tolerated without incident. Vitals obtained  were WDL.     Cleo Butler-Nicholson, LPN

## 2020-07-21 NOTE — Progress Notes (Signed)
Recreation Therapy Notes   Date: 07/21/2020  Time: 9:30 am  Location: Craft room   Behavioral response: Appropriate  Intervention Topic: Coping skills   Discussion/Intervention:  Group content on today was focused on coping skills. The group defined what coping skills are and when they normally use coping skills. Individuals described how they normally cope with thing and the coping skills they normally use. Patients expressed why it is important to cope with things and how not coping with things can affect you. The group participated in the intervention My coping box and made coping boxes while adding coping skills they could use in the future to the box. Clinical Observations/Feedback:  Patient came to group late due to unknown reasons and was focused on what peers and staff had to say about coping skills. Individual was social with peers and staff while participating in the intervention. Patient attempted to get writer to switch her zero sugar Gatorade for a regular Gatorade. Once write explained to the patient that her diet doesn't allow for a regular Gatorade, patient left Gatorade behind stating she would not drink it. Nurse was notified.  Royal Vandevoort LRT/CTRS         Ananias Kolander 07/21/2020 12:05 PM

## 2020-07-21 NOTE — Plan of Care (Signed)
  Problem: Education: Goal: Knowledge of [facility] Education information/materials will improve Outcome: Progressing Goal: Emotional status will improve Outcome: Progressing Goal: Mental status will improve Outcome: Progressing Goal: Verbalization of understanding the information provided will improve Outcome: Progressing   Problem: Activity: Goal: Interest or engagement in activities will improve Outcome: Progressing Goal: Sleeping patterns will improve Outcome: Progressing   Problem: Coping: Goal: Ability to verbalize frustrations and anger appropriately will improve Outcome: Progressing Goal: Ability to demonstrate self-control will improve Outcome: Progressing   Problem: Health Behavior/Discharge Planning: Goal: Identification of resources available to assist in meeting health care needs will improve Outcome: Progressing Goal: Compliance with treatment plan for underlying cause of condition will improve Outcome: Progressing   Problem: Physical Regulation: Goal: Ability to maintain clinical measurements within normal limits will improve Outcome: Progressing   Problem: Safety: Goal: Periods of time without injury will increase Outcome: Progressing   Problem: Education: Goal: Utilization of techniques to improve thought processes will improve Outcome: Progressing Goal: Knowledge of the prescribed therapeutic regimen will improve Outcome: Progressing   Problem: Activity: Goal: Interest or engagement in leisure activities will improve Outcome: Progressing Goal: Imbalance in normal sleep/wake cycle will improve Outcome: Progressing   Problem: Coping: Goal: Coping ability will improve Outcome: Progressing Goal: Will verbalize feelings Outcome: Progressing   Problem: Health Behavior/Discharge Planning: Goal: Ability to make decisions will improve Outcome: Progressing Goal: Compliance with therapeutic regimen will improve Outcome: Progressing    Problem: Role Relationship: Goal: Will demonstrate positive changes in social behaviors and relationships Outcome: Progressing   Problem: Safety: Goal: Ability to disclose and discuss suicidal ideas will improve Outcome: Progressing Goal: Ability to identify and utilize support systems that promote safety will improve Outcome: Progressing   Problem: Self-Concept: Goal: Will verbalize positive feelings about self Outcome: Progressing Goal: Level of anxiety will decrease Outcome: Progressing   

## 2020-07-21 NOTE — Plan of Care (Signed)
Pt denies depression, anxiety, SI, HI and AVH. Pt was educated on care plan and verbalizes understanding. Pt was encouraged to attend groups. Pt says "I just want to leave." Torrie Mayers RN Problem: Education: Goal: Knowledge of [facility] Education information/materials will improve Outcome: Progressing Goal: Emotional status will improve Outcome: Progressing Goal: Mental status will improve Outcome: Progressing Goal: Verbalization of understanding the information provided will improve Outcome: Progressing   Problem: Activity: Goal: Interest or engagement in activities will improve Outcome: Not Progressing Goal: Sleeping patterns will improve Outcome: Not Progressing   Problem: Coping: Goal: Ability to verbalize frustrations and anger appropriately will improve Outcome: Not Progressing Goal: Ability to demonstrate self-control will improve Outcome: Not Progressing   Problem: Health Behavior/Discharge Planning: Goal: Identification of resources available to assist in meeting health care needs will improve Outcome: Not Progressing Goal: Compliance with treatment plan for underlying cause of condition will improve Outcome: Not Progressing   Problem: Physical Regulation: Goal: Ability to maintain clinical measurements within normal limits will improve Outcome: Progressing   Problem: Safety: Goal: Periods of time without injury will increase Outcome: Progressing   Problem: Education: Goal: Utilization of techniques to improve thought processes will improve Outcome: Not Progressing Goal: Knowledge of the prescribed therapeutic regimen will improve Outcome: Progressing   Problem: Activity: Goal: Interest or engagement in leisure activities will improve Outcome: Not Progressing Goal: Imbalance in normal sleep/wake cycle will improve Outcome: Not Progressing   Problem: Coping: Goal: Coping ability will improve Outcome: Not Progressing Goal: Will verbalize  feelings Outcome: Progressing   Problem: Health Behavior/Discharge Planning: Goal: Ability to make decisions will improve Outcome: Not Progressing Goal: Compliance with therapeutic regimen will improve Outcome: Progressing   Problem: Role Relationship: Goal: Will demonstrate positive changes in social behaviors and relationships Outcome: Not Progressing   Problem: Safety: Goal: Ability to disclose and discuss suicidal ideas will improve Outcome: Progressing Goal: Ability to identify and utilize support systems that promote safety will improve Outcome: Progressing   Problem: Self-Concept: Goal: Will verbalize positive feelings about self Outcome: Not Progressing Goal: Level of anxiety will decrease Outcome: Progressing

## 2020-07-21 NOTE — Progress Notes (Signed)
Patient approaches nurses station with complaint of lower back pain.  She rates her pain at 8 on 0-10 scale. Patient received medication to help with symptoms and tolerated without incident. She has been in a sullen mood and reports feeling like no one cares about her.  She states that she wishes to leave [facility] and transfer to [facility] in [place]. She denies SI/HI/AVH. She  endorses depression and anxiety that she reports due to her current situation. Support and encouragement provided to patient.  She remains safe at this time with 15 minute safety checks and informed to contact staff with any concerns.     Cleo Butler-Nicholson, LPN

## 2020-07-21 NOTE — Progress Notes (Signed)
[facility] MD Progress Note  07/21/2020 11:24 AM [name]  MRN:  [id number] Subjective: Patient is a 34 year old female with a past psychiatric history significant for substance abuse, depression in pregnancy who was admitted on 07/16/2020 because of long-term substance abuse in her pregnancy.  Objective: Patient is seen and examined.  Patient is a 34 year old female with the above-stated past psychiatric history seen in follow-up.  She is essentially unchanged from yesterday.  She is unhappy about being here.  She is unhappy about the fact that she has to be on a diabetic diet because of gestational diabetes.  She stated that the nursing staff would not even check her blood sugar last night.  The nursing staff reported that she had refused her Accu-Chek last night.  She is upset over the fact that she did not have Tums available for for heartburn.  Review of the electronic medical record revealed that the Tums are available, but they are on a as needed basis.  I was going to change that today.  She stated she wants to be transferred up to the [facility] ward because she does not like it down here.  I explained to her that the OB folks transferred her down here.  I explained that there was no medical reason for her to be in the hospital on the obstetrical ward.  She was upset over the fact that she did not have the phone number to contact [facility] to try and transfer back to their facility.  I told her I would get her that number, but that I doubted that they would accept her in transfer.  She denied any other complaints this morning.  Her vital signs are stable, she is afebrile.  She slept 8.25 hours last night.  Her blood sugar last night was 92.  No new laboratories.  Principal Problem: <principal problem not specified> Diagnosis: Active Problems:   MDD (major depressive disorder), recurrent severe, without psychosis (HCC)  Total Time spent with patient: 15 minutes  Past Psychiatric History: See admission  H&P  Past Medical History:  Past Medical History:  Diagnosis Date  . Anxiety   . Depression   . Gestational diabetes   . History of substance abuse (HCC)   . History of suicide attempt   . History of thyroid disease   . Hypertension   . Thyroid disease     Past Surgical History:  Procedure Laterality Date  . CESAREAN SECTION  2000, 2002, 2013  . WISDOM TOOTH EXTRACTION     Family History:  Family History  Problem Relation Age of Onset  . Diabetes Mother    Family Psychiatric  History: See admission H&P Social History:  Social History   Substance and Sexual Activity  Alcohol Use Not Currently   Comment: social drinker     Social History   Substance and Sexual Activity  Drug Use Not Currently  . Types: Cocaine, Marijuana   Comment: Positive UDS 12/2017    Social History   Socioeconomic History  . Marital status: Single    Spouse name: Not on file  . Number of children: Not on file  . Years of education: Not on file  . Highest education level: Not on file  Occupational History  . Not on file  Tobacco Use  . Smoking status: Former Smoker    Packs/day: 0.50  . Smokeless tobacco: Never Used  Substance and Sexual Activity  . Alcohol use: Not Currently    Comment: social drinker  .  Drug use: Not Currently    Types: Cocaine, Marijuana    Comment: Positive UDS 12/2017  . Sexual activity: Not Currently    Birth control/protection: None  Other Topics Concern  . Not on file  Social History Narrative  . Not on file   Social Determinants of Health   Financial Resource Strain:   . Difficulty of Paying Living Expenses:   Food Insecurity:   . Worried About Programme researcher, broadcasting/film/video in the Last Year:   . Barista in the Last Year:   Transportation Needs:   . Freight forwarder (Medical):   Marland Kitchen Lack of Transportation (Non-Medical):   Physical Activity:   . Days of Exercise per Week:   . Minutes of Exercise per Session:   Stress:   . Feeling of Stress :    Social Connections:   . Frequency of Communication with Friends and Family:   . Frequency of Social Gatherings with Friends and Family:   . Attends Religious Services:   . Active Member of Clubs or Organizations:   . Attends Banker Meetings:   Marland Kitchen Marital Status:    Additional Social History:                         Sleep: Good  Appetite:  Good  Current Medications: Current Facility-Administered Medications  Medication Dose Route Frequency Provider Last Rate Last Admin  . acetaminophen (TYLENOL) tablet 650 mg  650 mg Oral Q6H PRN Jearld Lesch, NP   650 mg at 07/20/20 2007  . alum & mag hydroxide-simeth (MAALOX/MYLANTA) 200-200-20 MG/5ML suspension 30 mL  30 mL Oral Q4H PRN Dixon, Rashaun M, NP      . ARIPiprazole (ABILIFY) tablet 5 mg  5 mg Oral QHS Roselind Messier, MD   5 mg at 07/20/20 2117  . ARIPiprazole ER (ABILIFY MAINTENA) injection 400 mg  400 mg Intramuscular Once Roselind Messier, MD      . calcium carbonate (TUMS - dosed in mg elemental calcium) chewable tablet 200 mg of elemental calcium  1 tablet Oral TID PRN Antonieta Pert, MD      . cyclobenzaprine (FLEXERIL) tablet 5 mg  5 mg Oral TID PRN Roselind Messier, MD      . diphenhydrAMINE (BENADRYL) capsule 50 mg  50 mg Oral Q6H PRN Jearld Lesch, NP   50 mg at 07/18/20 2120  . magnesium hydroxide (MILK OF MAGNESIA) suspension 30 mL  30 mL Oral Daily PRN Jearld Lesch, NP      . prenatal multivitamin tablet 1 tablet  1 tablet Oral Q1200 Jearld Lesch, NP   1 tablet at 07/19/20 1150  . sertraline (ZOLOFT) tablet 50 mg  50 mg Oral Daily Dixon, Rashaun M, NP   50 mg at 07/21/20 0930  . traZODone (DESYREL) tablet 50 mg  50 mg Oral QHS Roselind Messier, MD   50 mg at 07/20/20 2117    Lab Results: No results found for this or any previous visit (from the past 48 hour(s)).  Blood Alcohol level:  Lab Results  Component Value Date   ETH 131 (H) 07/15/2020   ETH 89 (H) 06/02/2020     Metabolic Disorder Labs: Lab Results  Component Value Date   HGBA1C 5.5 06/05/2020   MPG 111.15 06/05/2020   MPG 99.67 01/02/2018   No results found for: PROLACTIN Lab Results  Component Value Date   CHOL 218 (H) 01/02/2018  TRIG 187 (H) 01/02/2018   HDL 81 01/02/2018   CHOLHDL 2.7 01/02/2018   VLDL 37 01/02/2018   LDLCALC 100 (H) 01/02/2018   LDLCALC 82 01/12/2016    Physical Findings: AIMS:  , ,  ,  ,    CIWA:    COWS:     Musculoskeletal: Strength & Muscle Tone: within normal limits Gait & Station: normal Patient leans: N/A  Psychiatric Specialty Exam: Physical Exam Vitals and nursing note reviewed.  Constitutional:      Appearance: Normal appearance.  HENT:     Head: Normocephalic and atraumatic.  Pulmonary:     Effort: Pulmonary effort is normal.  Neurological:     General: No focal deficit present.     Mental Status: She is alert and oriented to person, place, and time.     Review of Systems  Blood pressure (!) 100/42, pulse 64, temperature 97.9 F (36.6 C), temperature source Oral, resp. rate 14, height 4\' 11"  (1.499 m), weight 76 kg, last menstrual period 12/04/2019, SpO2 100 %.Body mass index is 33.84 kg/m.  General Appearance: Casual  Eye Contact:  Good  Speech:  Normal Rate  Volume:  Increased  Mood:  Irritable  Affect:  Congruent  Thought Process:  Coherent and Descriptions of Associations: Intact  Orientation:  Full (Time, Place, and Person)  Thought Content:  Logical  Suicidal Thoughts:  No  Homicidal Thoughts:  No  Memory:  Immediate;   Fair Recent;   Fair Remote;   Fair  Judgement:  Impaired  Insight:  Lacking  Psychomotor Activity:  Normal  Concentration:  Concentration: Good and Attention Span: Good  Recall:  Fair  Fund of Knowledge:  Good  Language:  Fair  Akathisia:  Negative  Handed:  Right  AIMS (if indicated):     Assets:  Desire for Improvement Resilience  ADL's:  Intact  Cognition:  WNL  Sleep:  Number of  Hours: 8.25     Treatment Plan Summary: Daily contact with patient to assess and evaluate symptoms and progress in treatment, Medication management and Plan : Patient is seen and examined.  Patient is a 34 year old female with the above-stated past psychiatric history who is seen in follow-up.  Diagnosis: 1.  Polysubstance dependence. 2.  Reported history of bipolar disorder. 3.  Personality disorder unspecified 4.  Gestational diabetes 5.  Intrauterine pregnancy at approximately 33 weeks  Pertinent findings on examination today: 1.  Irritability continues but is secondary to behavioral origins and not necessarily her psychiatric condition.  Plan: 1.  Continue Abilify 5 mg p.o. nightly for mood stability. 2.  Long-acting Abilify injection 400 mg IM scheduled for 7/26 x 1 dose for mood stability. 3.  Continue Cogentin 0.5 mg p.o. twice daily for side effects of medication. 4.  Continue Flexeril 5 mg p.o. 3 times daily as needed muscle spasms. 5.  Continue carb modified diet. 6.  Continue sertraline 50 mg p.o. daily for depression and anxiety. 7.  Continue trazodone 50 mg p.o. nightly for insomnia. 8.    Reorder Accu-Cheks. 9.  Change Tums to standing versus as needed. 10.  Disposition planning-in progress. 8/26, MD 07/21/2020, 11:24 AM

## 2020-07-21 NOTE — Progress Notes (Signed)
34yo [id number] at 32+5wks in behavioral health unit, no OB complaints today, reassuring NST.  Pt needs to deliver at [facility], and would benefit from comprehensive perinatal psych care, but she has been there in the past. Prior multiple cesarean sections, and she would benefit from delivery at a tertiary care facility.  NST  Baseline: 150 Variability: moderate Accelerations present x >2 Decelerations absent Time  Interpretation: reactive NST, category 1 tracing  ----- Christeen Douglas, MD MPH Attending Obstetrician and Gynecologist [facility], Department of OB/GYN [facility]

## 2020-07-22 LAB — GLUCOSE, CAPILLARY
Glucose-Capillary: 82 mg/dL (ref 70–99)
Glucose-Capillary: 103 mg/dL — ABNORMAL HIGH (ref 70–99)

## 2020-07-22 LAB — HEMOGLOBIN A1C
Hgb A1c MFr Bld: 5.4 % (ref 4.8–5.6)
Mean Plasma Glucose: 108 mg/dL

## 2020-07-22 MED ORDER — HALOPERIDOL 5 MG PO TABS: 5.0000 mg | ORAL_TABLET | Freq: Once | ORAL | Status: DC | PRN

## 2020-07-22 MED ORDER — HALOPERIDOL LACTATE 5 MG/ML IJ SOLN: 5.0000 mg | Freq: Once | INTRAMUSCULAR | Status: DC | PRN

## 2020-07-22 NOTE — Progress Notes (Signed)
Pt refused breakfast. MD aware. Torrie Mayers RN

## 2020-07-22 NOTE — Progress Notes (Signed)
[facility] MD Progress Note  07/22/2020 11:51 AM [name]  MRN:  [id number] Subjective:  Patient is a 34 year old female with a past psychiatric history significant for substance abuse, depression in pregnancy who was admitted on 07/16/2020 because of long-term substance abuse in her pregnancy.  Objective: Patient is seen and examined.  Patient is a 34 year old female with the above-stated past psychiatric history who is seen in follow-up.  She continues to be essentially unchanged.  She has minimal insight to why she is in the hospital.  She has minimal insight to her drug use.  Today's request was that she get a pizza.  She believes that people on OB get different food than is sent to psychiatry.  She believes being on a diet controlled diet like the carb controlled diet is unfair.  When we discussed the nature of this she had minimal insight to the safety of the baby with regard to the diet.  She was given the information to contact the [facility] out reach clinic where she had been referred to in an attempt to transfer to [facility].  I explained to the patient that I doubted that they would accept her in transfer.  I explained again that the patient does not fulfill criteria for transfer to the medical OB unit.  I stated there was no need for her to be on the unit medically.  She is unhappy about that.  She does complain of lower pelvic pain, and stated she feels like the baby has begun to drop.  She does have Tylenol available for her, and the nurses have encouraged her to take this.  Her blood pressure is low normal today.  It is 96/52.  She is afebrile.  She slept 7 hours last night.  Her blood sugar this morning is 82.  Her hemoglobin A1c came back at 5.4, so I am going to stop her carb controlled diet.  Principal Problem: <principal problem not specified> Diagnosis: Active Problems:   MDD (major depressive disorder), recurrent severe, without psychosis (HCC)  Total Time spent with patient: 15  minutes  Past Psychiatric History: See admission H&P  Past Medical History:  Past Medical History:  Diagnosis Date  . Anxiety   . Depression   . Gestational diabetes   . History of substance abuse (HCC)   . History of suicide attempt   . History of thyroid disease   . Hypertension   . Thyroid disease     Past Surgical History:  Procedure Laterality Date  . CESAREAN SECTION  2000, 2002, 2013  . WISDOM TOOTH EXTRACTION     Family History:  Family History  Problem Relation Age of Onset  . Diabetes Mother    Family Psychiatric  History: See admission H&P Social History:  Social History   Substance and Sexual Activity  Alcohol Use Not Currently   Comment: social drinker     Social History   Substance and Sexual Activity  Drug Use Not Currently  . Types: Cocaine, Marijuana   Comment: Positive UDS 12/2017    Social History   Socioeconomic History  . Marital status: Single    Spouse name: Not on file  . Number of children: Not on file  . Years of education: Not on file  . Highest education level: Not on file  Occupational History  . Not on file  Tobacco Use  . Smoking status: Former Smoker    Packs/day: 0.50  . Smokeless tobacco: Never Used  Substance and Sexual Activity  .  Alcohol use: Not Currently    Comment: social drinker  . Drug use: Not Currently    Types: Cocaine, Marijuana    Comment: Positive UDS 12/2017  . Sexual activity: Not Currently    Birth control/protection: None  Other Topics Concern  . Not on file  Social History Narrative  . Not on file   Social Determinants of Health   Financial Resource Strain:   . Difficulty of Paying Living Expenses:   Food Insecurity:   . Worried About Programme researcher, broadcasting/film/videounning Out of Food in the Last Year:   . Baristaan Out of Food in the Last Year:   Transportation Needs:   . Freight forwarderLack of Transportation (Medical):   Marland Kitchen. Lack of Transportation (Non-Medical):   Physical Activity:   . Days of Exercise per Week:   . Minutes of Exercise  per Session:   Stress:   . Feeling of Stress :   Social Connections:   . Frequency of Communication with Friends and Family:   . Frequency of Social Gatherings with Friends and Family:   . Attends Religious Services:   . Active Member of Clubs or Organizations:   . Attends BankerClub or Organization Meetings:   Marland Kitchen. Marital Status:    Additional Social History:                         Sleep: Good  Appetite:  Good  Current Medications: Current Facility-Administered Medications  Medication Dose Route Frequency Provider Last Rate Last Admin  . acetaminophen (TYLENOL) tablet 650 mg  650 mg Oral Q6H PRN Jearld Leschixon, Rashaun M, NP   650 mg at 07/22/20 0757  . alum & mag hydroxide-simeth (MAALOX/MYLANTA) 200-200-20 MG/5ML suspension 30 mL  30 mL Oral Q4H PRN Dixon, Rashaun M, NP      . ARIPiprazole (ABILIFY) tablet 5 mg  5 mg Oral QHS Roselind Messierao, Ramakrishna, MD   5 mg at 07/21/20 2208  . ARIPiprazole ER (ABILIFY MAINTENA) injection 400 mg  400 mg Intramuscular Once Roselind Messierao, Ramakrishna, MD      . calcium carbonate (TUMS - dosed in mg elemental calcium) chewable tablet 200 mg of elemental calcium  1 tablet Oral BID Antonieta Pertlary, Colin Ellers Lawson, MD   200 mg of elemental calcium at 07/21/20 1152  . cyclobenzaprine (FLEXERIL) tablet 5 mg  5 mg Oral TID PRN Roselind Messierao, Ramakrishna, MD      . diphenhydrAMINE (BENADRYL) capsule 50 mg  50 mg Oral Q6H PRN Jearld Leschixon, Rashaun M, NP   50 mg at 07/21/20 2208  . magnesium hydroxide (MILK OF MAGNESIA) suspension 30 mL  30 mL Oral Daily PRN Jearld Leschixon, Rashaun M, NP      . prenatal multivitamin tablet 1 tablet  1 tablet Oral Q1200 Jearld Leschixon, Rashaun M, NP   1 tablet at 07/22/20 1146  . sertraline (ZOLOFT) tablet 50 mg  50 mg Oral Daily Dixon, Rashaun M, NP   50 mg at 07/22/20 0757  . traZODone (DESYREL) tablet 50 mg  50 mg Oral QHS Roselind Messierao, Ramakrishna, MD   50 mg at 07/21/20 2208    Lab Results:  Results for orders placed or performed during the hospital encounter of 07/18/20 (from the past 48  hour(s))  Hemoglobin A1c     Status: None   Collection Time: 07/21/20 12:01 PM  Result Value Ref Range   Hgb A1c MFr Bld 5.4 4.8 - 5.6 %    Comment: (NOTE)         Prediabetes: 5.7 - 6.4  Diabetes: >6.4         Glycemic control for adults with diabetes: <7.0    Mean Plasma Glucose 108 mg/dL    Comment: (NOTE) Performed At: [facility] [address] [place], [place] [id number] Jolene Schimke MD RC:[id number]   Glucose, capillary     Status: None   Collection Time: 07/22/20  6:56 AM  Result Value Ref Range   Glucose-Capillary 82 70 - 99 mg/dL    Comment: Glucose reference range applies only to samples taken after fasting for at least 8 hours.   Comment 1 Notify RN     Blood Alcohol level:  Lab Results  Component Value Date   ETH 131 (H) 07/15/2020   ETH 89 (H) 06/02/2020    Metabolic Disorder Labs: Lab Results  Component Value Date   HGBA1C 5.4 07/21/2020   MPG 108 07/21/2020   MPG 111.15 06/05/2020   No results found for: PROLACTIN Lab Results  Component Value Date   CHOL 218 (H) 01/02/2018   TRIG 187 (H) 01/02/2018   HDL 81 01/02/2018   CHOLHDL 2.7 01/02/2018   VLDL 37 01/02/2018   LDLCALC 100 (H) 01/02/2018   LDLCALC 82 01/12/2016    Physical Findings: AIMS:  , ,  ,  ,    CIWA:    COWS:     Musculoskeletal: Strength & Muscle Tone: within normal limits Gait & Station: normal Patient leans: N/A  Psychiatric Specialty Exam: Physical Exam Vitals and nursing note reviewed.  Constitutional:      Appearance: Normal appearance.  HENT:     Head: Normocephalic and atraumatic.  Pulmonary:     Effort: Pulmonary effort is normal.  Neurological:     General: No focal deficit present.     Mental Status: She is alert and oriented to person, place, and time.     Review of Systems  Blood pressure (!) 96/52, pulse 65, temperature 97.9 F (36.6 C), temperature source Oral, resp. rate 14, height 4\' 11"  (1.499 m), weight 76 kg, last menstrual  period 12/04/2019, SpO2 100 %.Body mass index is 33.84 kg/m.  General Appearance: Disheveled  Eye Contact:  Good  Speech:  Normal Rate  Volume:  Increased  Mood:  Irritable  Affect:  Congruent  Thought Process:  Coherent and Descriptions of Associations: Intact  Orientation:  Full (Time, Place, and Person)  Thought Content:  Logical  Suicidal Thoughts:  No  Homicidal Thoughts:  No  Memory:  Immediate;   Fair Recent;   Fair Remote;   Fair  Judgement:  Impaired  Insight:  Lacking  Psychomotor Activity:  Increased  Concentration:  Concentration: Good and Attention Span: Good  Recall:  14/09/2019 of Knowledge:  Fair  Language:  Good  Akathisia:  Negative  Handed:  Right  AIMS (if indicated):     Assets:  Desire for Improvement Resilience  ADL's:  Intact  Cognition:  WNL  Sleep:  Number of Hours: 7     Treatment Plan Summary: Daily contact with patient to assess and evaluate symptoms and progress in treatment, Medication management and Plan : Patient is seen and examined.  Patient is a 34 year old female with the above-stated past psychiatric history who is seen in follow-up.  Diagnosis: 1. Polysubstance dependence. 2. Reported history of bipolar disorder. 3. Personality disorder unspecified 4. Intrauterine pregnancy at approximately 33 weeks  Pertinent findings on examination today: 1.  Irritability continues but is secondary to behavioral origins and not necessarily her psychiatric condition. 2.  Hemoglobin A1c came back at 5.4, and I will stop the carbohydrate controlled diet as well as Accu-Cheks today.   Plan: 1. Continue Abilify 5 mg p.o. nightly for mood stability. 2. Long-acting Abilify injection 400 mg IM scheduled for 7/26 x 1 dose for mood stability. 3. Continue Cogentin 0.5 mg p.o. twice daily for side effects of medication. 4. Continue Flexeril 5 mg p.o. 3 times daily as needed muscle spasms. 5.  Stop carb modified diet. 6. Continue sertraline 50  mg p.o. daily for depression and anxiety. 7. Continue trazodone 50 mg p.o. nightly for insomnia. 8.  Stop Accu-Cheks. 9.  Change Tums to standing versus as needed. 10.  Disposition planning-in progress.  Antonieta Pert, MD 07/22/2020, 11:51 AM

## 2020-07-22 NOTE — Progress Notes (Signed)
Pt got her lunch tray but still complaining about the food. "I just want to leave". She ate a few bites and then wen back to her room.   Torrie Mayers RN

## 2020-07-22 NOTE — Plan of Care (Signed)
Patient is irritated with being here and states she wants to transfer to another facility. Patient given education.   Problem: Education: Goal: Emotional status will improve Outcome: Not Progressing Goal: Mental status will improve Outcome: Not Progressing

## 2020-07-22 NOTE — Progress Notes (Signed)
Patient calm and pleasant during assessment denying SI/HI/AVH. Patient endorses anxiety and depression stating it's from being here. Patient stated she was irritable today because she doesn't want to be here any more. Patient given education, support and encouragement to be active in her treatment plan. Patient compliant with medication administration per MD orders. Patient being monitored Q 15 minutes for safety per unit protocol. Patient remains safe on the unit.

## 2020-07-22 NOTE — Progress Notes (Signed)
BRIEF PHARMACY NOTE   This patient attended and participated in Medication Management Group counseling led by [facility] staff pharmacist.  This interactive class reviews basic information about prescription medications and education on personal responsibility in medication management.  The class also includes general knowledge of 3 main classes of behavioral medications, including antipsychotics, antidepressants, and mood stabilizers.     Patient behavior was appropriate for group setting.   Educational materials sourced from:  "Medication Do's and Don'ts" from [email or web address]   "Mental Health Medications" from [facility] FaxRack.tn.shtml#part 416606    Albina Billet, PharmD, BCPS Clinical Pharmacist 07/22/2020 3:20 PM

## 2020-07-22 NOTE — Progress Notes (Addendum)
Pt acted out by flipping a table over, turning over a trash can, throwing a remote all because she wants to leave. She threw a hamper out of her room and a bunch of paper towels. She calls the food "dog food". She was told to go back to her room to calm down and her food and drink were taken to her. Pt stated "I'm going to kill myself and my baby". Pt did calm down. MD was notified and a PRN ordered. Torrie Mayers RN

## 2020-07-22 NOTE — Progress Notes (Signed)
Patient alert and oriented x 4, affect is blunted thoughts are organized and coherent. Patient's denies SI/HI/AVH interacting appropriately with peers and staffshe c/o back pain and was medicated with tylenol PRN as neededshe was receptive to staff, nurse came down from mother baby and performed an NST on patient she was cooperative with procedure, 15 minutes safety checks maintained will continue to monitor closely...15 minutes safety checks maintained will continue to monitor.

## 2020-07-22 NOTE — Progress Notes (Signed)
Did a recheck on BP... still low so I gave ger a Gatorade zero to drink. Will continue to monitor. Torrie Mayers RN

## 2020-07-22 NOTE — Tx Team (Signed)
Interdisciplinary Treatment and Diagnostic Plan Update  07/22/2020 Time of Session: 830AM [name] Clear MRN: [id number]  Principal Diagnosis: <principal problem not specified>  Secondary Diagnoses: Active Problems:   MDD (major depressive disorder), recurrent severe, without psychosis (HCC)   Current Medications:  Current Facility-Administered Medications  Medication Dose Route Frequency Provider Last Rate Last Admin  . acetaminophen (TYLENOL) tablet 650 mg  650 mg Oral Q6H PRN Jearld Lesch, NP   650 mg at 07/22/20 0757  . alum & mag hydroxide-simeth (MAALOX/MYLANTA) 200-200-20 MG/5ML suspension 30 mL  30 mL Oral Q4H PRN Dixon, Rashaun M, NP      . ARIPiprazole (ABILIFY) tablet 5 mg  5 mg Oral QHS Roselind Messier, MD   5 mg at 07/21/20 2208  . ARIPiprazole ER (ABILIFY MAINTENA) injection 400 mg  400 mg Intramuscular Once Roselind Messier, MD      . calcium carbonate (TUMS - dosed in mg elemental calcium) chewable tablet 200 mg of elemental calcium  1 tablet Oral BID Antonieta Pert, MD   200 mg of elemental calcium at 07/21/20 1152  . cyclobenzaprine (FLEXERIL) tablet 5 mg  5 mg Oral TID PRN Roselind Messier, MD      . diphenhydrAMINE (BENADRYL) capsule 50 mg  50 mg Oral Q6H PRN Jearld Lesch, NP   50 mg at 07/21/20 2208  . magnesium hydroxide (MILK OF MAGNESIA) suspension 30 mL  30 mL Oral Daily PRN Jearld Lesch, NP      . prenatal multivitamin tablet 1 tablet  1 tablet Oral Q1200 Jearld Lesch, NP   1 tablet at 07/22/20 1146  . sertraline (ZOLOFT) tablet 50 mg  50 mg Oral Daily Dixon, Rashaun M, NP   50 mg at 07/22/20 0757  . traZODone (DESYREL) tablet 50 mg  50 mg Oral QHS Roselind Messier, MD   50 mg at 07/21/20 2208   PTA Medications: Medications Prior to Admission  Medication Sig Dispense Refill Last Dose  . Prenatal Vit-Fe Fumarate-FA (PRENATAL MULTIVITAMIN) TABS tablet Take 1 tablet by mouth daily at 12 noon.     . sertraline (ZOLOFT) 50 MG tablet Take 50  mg by mouth daily.       Patient Stressors:    Patient Strengths:    Treatment Modalities: Medication Management, Group therapy, Case management,  1 to 1 session with clinician, Psychoeducation, Recreational therapy.   Physician Treatment Plan for Primary Diagnosis: <principal problem not specified> Long Term Goal(s):     Short Term Goals:    Medication Management: Evaluate patient's response, side effects, and tolerance of medication regimen.  Therapeutic Interventions: 1 to 1 sessions, Unit Group sessions and Medication administration.  Evaluation of Outcomes: Progressing  Physician Treatment Plan for Secondary Diagnosis: Active Problems:   MDD (major depressive disorder), recurrent severe, without psychosis (HCC)  Long Term Goal(s):     Short Term Goals:       Medication Management: Evaluate patient's response, side effects, and tolerance of medication regimen.  Therapeutic Interventions: 1 to 1 sessions, Unit Group sessions and Medication administration.  Evaluation of Outcomes: Progressing   RN Treatment Plan for Primary Diagnosis: <principal problem not specified> Long Term Goal(s): Knowledge of disease and therapeutic regimen to maintain health will improve  Short Term Goals: Ability to remain free from injury will improve, Ability to verbalize frustration and anger appropriately will improve, Ability to demonstrate self-control, Ability to participate in decision making will improve, Ability to verbalize feelings will improve, Ability to disclose and discuss  suicidal ideas, Ability to identify and develop effective coping behaviors will improve and Compliance with prescribed medications will improve  Medication Management: RN will administer medications as ordered by provider, will assess and evaluate patient's response and provide education to patient for prescribed medication. RN will report any adverse and/or side effects to prescribing provider.  Therapeutic  Interventions: 1 on 1 counseling sessions, Psychoeducation, Medication administration, Evaluate responses to treatment, Monitor vital signs and CBGs as ordered, Perform/monitor CIWA, COWS, AIMS and Fall Risk screenings as ordered, Perform wound care treatments as ordered.  Evaluation of Outcomes: Progressing   LCSW Treatment Plan for Primary Diagnosis: <principal problem not specified> Long Term Goal(s): Safe transition to appropriate next level of care at discharge, Engage patient in therapeutic group addressing interpersonal concerns.  Short Term Goals: Engage patient in aftercare planning with referrals and resources, Increase social support, Increase ability to appropriately verbalize feelings, Increase emotional regulation, Facilitate acceptance of mental health diagnosis and concerns, Facilitate patient progression through stages of change regarding substance use diagnoses and concerns, Identify triggers associated with mental health/substance abuse issues and Increase skills for wellness and recovery  Therapeutic Interventions: Assess for all discharge needs, 1 to 1 time with Social worker, Explore available resources and support systems, Assess for adequacy in community support network, Educate family and significant other(s) on suicide prevention, Complete Psychosocial Assessment, Interpersonal group therapy.  Evaluation of Outcomes: Progressing   Progress in Treatment: Attending groups: No. Participating in groups: No. Taking medication as prescribed: Yes. Toleration medication: Yes. Family/Significant other contact made: Yes, individual(s) contacted:  Sister was contacted  Patient understands diagnosis: Yes. Discussing patient identified problems/goals with staff: No. Medical problems stabilized or resolved: Yes. Denies suicidal/homicidal ideation: Yes. Issues/concerns per patient self-inventory: No. Other: NA   New problem(s) identified: No, Describe:  None  New Short  Term/Long Term Goal(s): medication stabilization, elimination of SI thoughts, development of comprehensive mental wellness plan  Patient Goals:  Patient stated that she has no goals. Patient also spoke about her children being in DSS custody but did not know the status of the case.   Discharge Plan or Barriers: Discharge plan is pending. Housing could be a barrier for patient when discharged. Patient also is not sure if she wants to care for her unborn child and if DSS will take her baby into custody. Update 07/22/20-Pt declines referral for substance use inpatient residential treatment. Pt states she will resume CST with [facility].   Reason for Continuation of Hospitalization: Depression Suicidal ideation  Estimated Length of Stay: 5-7 Days   Attendees: Patient: 07/22/2020 3:04 PM  Physician: Dr. Jola Babinski, MD 07/22/2020 3:04 PM  Nursing: Horald Pollen Manuaritti 07/22/2020 3:04 PM  RN Care Manager: 07/22/2020 3:04 PM  Social Worker: Lowella Dandy 07/22/2020 3:04 PM  Recreational Therapist:  07/22/2020 3:04 PM  Other:  07/22/2020 3:04 PM  Other:  07/22/2020 3:04 PM  Other: 07/22/2020 3:04 PM    Scribe for Treatment Team: Suzan Slick, LCSW 07/22/2020 3:04 PM

## 2020-07-22 NOTE — Plan of Care (Signed)
Pt rates depression 8/10. Pt denies anxiety, SI, HI and AVH. Pt was educated on care plan and verbalizes understanding. Pt was encouraged to attend groups. Torrie Mayers RN Problem: Education: Goal: Knowledge of [facility] Education information/materials will improve Outcome: Progressing Goal: Emotional status will improve Outcome: Not Progressing Goal: Mental status will improve Outcome: Not Progressing Goal: Verbalization of understanding the information provided will improve Outcome: Progressing   Problem: Activity: Goal: Interest or engagement in activities will improve Outcome: Not Progressing   Problem: Coping: Goal: Ability to verbalize frustrations and anger appropriately will improve Outcome: Progressing Goal: Ability to demonstrate self-control will improve Outcome: Progressing   Problem: Health Behavior/Discharge Planning: Goal: Identification of resources available to assist in meeting health care needs will improve Outcome: Progressing Goal: Compliance with treatment plan for underlying cause of condition will improve Outcome: Progressing   Problem: Physical Regulation: Goal: Ability to maintain clinical measurements within normal limits will improve Outcome: Progressing   Problem: Safety: Goal: Periods of time without injury will increase Outcome: Progressing   Problem: Education: Goal: Knowledge of the prescribed therapeutic regimen will improve Outcome: Progressing   Problem: Activity: Goal: Interest or engagement in leisure activities will improve Outcome: Not Progressing Goal: Imbalance in normal sleep/wake cycle will improve Outcome: Not Progressing   Problem: Coping: Goal: Coping ability will improve Outcome: Progressing Goal: Will verbalize feelings Outcome: Progressing   Problem: Health Behavior/Discharge Planning: Goal: Ability to make decisions will improve Outcome: Progressing Goal: Compliance with therapeutic regimen will  improve Outcome: Progressing   Problem: Role Relationship: Goal: Will demonstrate positive changes in social behaviors and relationships Outcome: Progressing   Problem: Safety: Goal: Ability to disclose and discuss suicidal ideas will improve Outcome: Progressing Goal: Ability to identify and utilize support systems that promote safety will improve Outcome: Progressing   Problem: Self-Concept: Goal: Will verbalize positive feelings about self Outcome: Not Progressing Goal: Level of anxiety will decrease Outcome: Not Progressing

## 2020-07-22 NOTE — Progress Notes (Signed)
Recreation Therapy Notes  Date: 07/22/2020   Time: 9:30 am   Location: Court yard    Behavioral response: N/A   Intervention Topic: Animal Assisted therapy     Discussion/Intervention: Patient did not attend group.   Clinical Observations/Feedback:  Patient did not attend group.   Keelyn Monjaras LRT/CTRS        Taylia Berber 07/22/2020 11:28 AM 

## 2020-07-22 NOTE — BHH Counselor (Signed)
CSW met with the pt to discuss discharge plan. Pt declines referral for substance use inpatient residential treatment. Pt states she will resume CST at [facility]. Pt request to be discharged, sent to the medical floor or transferred to [place] states she no longer feels she needs to be in an inpatient setting. CSW relayed this information to the physician.

## 2020-07-23 DIAGNOSIS — Z8759 Personal history of other complications of pregnancy, childbirth and the puerperium: Secondary | ICD-10-CM

## 2020-07-23 DIAGNOSIS — O99340 Other mental disorders complicating pregnancy, unspecified trimester: Secondary | ICD-10-CM | POA: Diagnosis present

## 2020-07-23 DIAGNOSIS — O10919 Unspecified pre-existing hypertension complicating pregnancy, unspecified trimester: Secondary | ICD-10-CM | POA: Diagnosis present

## 2020-07-23 DIAGNOSIS — O98313 Other infections with a predominantly sexual mode of transmission complicating pregnancy, third trimester: Secondary | ICD-10-CM

## 2020-07-23 DIAGNOSIS — O99810 Abnormal glucose complicating pregnancy: Secondary | ICD-10-CM | POA: Diagnosis present

## 2020-07-23 DIAGNOSIS — O99320 Drug use complicating pregnancy, unspecified trimester: Secondary | ICD-10-CM | POA: Diagnosis present

## 2020-07-23 DIAGNOSIS — O99519 Diseases of the respiratory system complicating pregnancy, unspecified trimester: Secondary | ICD-10-CM

## 2020-07-23 DIAGNOSIS — R109 Unspecified abdominal pain: Secondary | ICD-10-CM

## 2020-07-23 DIAGNOSIS — J45909 Unspecified asthma, uncomplicated: Secondary | ICD-10-CM

## 2020-07-23 DIAGNOSIS — F319 Bipolar disorder, unspecified: Secondary | ICD-10-CM | POA: Diagnosis present

## 2020-07-23 DIAGNOSIS — A6009 Herpesviral infection of other urogenital tract: Secondary | ICD-10-CM | POA: Diagnosis present

## 2020-07-23 DIAGNOSIS — O24410 Gestational diabetes mellitus in pregnancy, diet controlled: Secondary | ICD-10-CM

## 2020-07-23 DIAGNOSIS — O09219 Supervision of pregnancy with history of pre-term labor, unspecified trimester: Secondary | ICD-10-CM

## 2020-07-23 DIAGNOSIS — F332 Major depressive disorder, recurrent severe without psychotic features: Secondary | ICD-10-CM

## 2020-07-23 DIAGNOSIS — O98319 Other infections with a predominantly sexual mode of transmission complicating pregnancy, unspecified trimester: Secondary | ICD-10-CM | POA: Diagnosis present

## 2020-07-23 DIAGNOSIS — O0990 Supervision of high risk pregnancy, unspecified, unspecified trimester: Secondary | ICD-10-CM

## 2020-07-23 DIAGNOSIS — O09299 Supervision of pregnancy with other poor reproductive or obstetric history, unspecified trimester: Secondary | ICD-10-CM

## 2020-07-23 DIAGNOSIS — O26893 Other specified pregnancy related conditions, third trimester: Secondary | ICD-10-CM

## 2020-07-23 NOTE — Progress Notes (Signed)
D: Pt alert and oriented x 4. Pt rates anxiety 10/10.Pt reports Pt reports experiencing 8/10 lower back pain, prn med given. Pt denies experiencing any SI/HI, or AVH at this time.   Consult has been put in for pt to be seen by the midwife/OB.   A: Scheduled medications administered to pt, per MD orders. Support and encouragement provided. Frequent verbal contact made. Routine safety checks conducted q15 minutes.   R: No adverse drug reactions noted. Pt verbally contracts for safety at this time. Pt complaint with medications and treatment plan. Pt interacts well with others on the unit. Pt remains safe at this time. Will continue to monitor.

## 2020-07-23 NOTE — Consult Note (Signed)
Obstetrics & Gynecology Consult H&P   Consulting Department: Behavioral  Consulting Physician: Lerry Liner  Consulting Question: Evaluate for possible labor  History of Present Illness: 34 y.o. [id number] at [redacted]w[redacted]d by 28 week [place] dervied Estimated Date of Delivery: 09/10/20 with a complicated significant substance abuse history, bipolar, CS x5, possible GDM, history of IUGR currently admitted to behavioral health.  Has noted pelvic pressure and sharp intermittent pelvic pain, positional.  She has a history of one preterm delivery with remaining deliveries at term, all of these by cesarean section.  She has been evaluated several times this admission for possible preterm labor.  +FM, no LOF, no VB.    Pregnancy Problems (from 04/12/20 to present)    Problem Noted Resolved   Supervision of high risk pregnancy, antepartum 07/23/2020 by Vena Austria, MD No   Overview Signed 07/23/2020 11:10 PM by Vena Austria, MD    Clinic No [facility] Prenatal Labs  Dating 28 week [place] [facility] Blood type: --/--/O POS (05/26 1406)   Genetic Screen N/A Antibody:NEG (05/26 1406)  Anatomic [place] 28 week [facility] Rubella: 1.23 (05/26 1406)   GTT Third trimester: 177 RPR: NON REACTIVE (05/26 1406)   Rhogam  HBsAg: NON REACTIVE (04/18 1136)   TDaP vaccine                       Flu Shot: HIV: NON REACTIVE (05/26 1406)   Baby Food                                GBS:   Contraception  Pap: History of HSIL pap no follow up pathology/colposcopy  CBB     CS/VBAC CS x 4 requires repeat   Support Person           Substance abuse complicating pregnancy, antepartum 07/23/2020 by Vena Austria, MD No   Bipolar disease in pregnancy, unspecified trimester (HCC) 07/23/2020 by Vena Austria, MD No   Genital herpes affecting pregnancy 07/23/2020 by Vena Austria, MD No   Abnormal glucose tolerance test in pregnancy 07/23/2020 by Vena Austria, MD No   High risk pregnancy due to history of preterm labor, antepartum 07/23/2020  by Vena Austria, MD No   Chronic hypertension during pregnancy, antepartum 07/23/2020 by Vena Austria, MD No   Overview Signed 07/23/2020 10:39 PM by Vena Austria, MD    Likely secondary to underlying cocaine abuse      History of prior pregnancy with IUGR newborn 07/23/2020 by Vena Austria, MD No   Asthma affecting pregnancy, antepartum 07/23/2020 by Vena Austria, MD No   History of fetal anomaly in prior pregnancy, currently pregnant 07/23/2020 by Vena Austria, MD No   Overview Signed 07/23/2020 10:57 PM by Vena Austria, MD    agenesis of corpus callosum G1       Cramping complicating pregnancy, antepartum 06/14/2020 by Haroldine Laws, CNM No   History of cesarean delivery 05/28/2020 by Haroldine Laws, CNM No        Review of Systems:10 point review of systems  Past Medical History:  Patient Active Problem List   Diagnosis Date Noted  . MDD (major depressive disorder), recurrent severe, without psychosis (HCC) 07/18/2020  . Abdominal pain during pregnancy, third trimester 07/17/2020  . Major depressive disorder, recurrent severe without psychotic features (HCC) 07/15/2020  . Cramping complicating pregnancy, antepartum 06/14/2020  . History of preterm labor, current pregnancy, second trimester   . Severe recurrent  major depression without psychotic features (HCC) 06/03/2020  . Fall 05/28/2020  . History of cesarean delivery 05/28/2020  . Bipolar disorder, curr episode mixed, severe, with psychotic features (HCC)   . MDD (major depressive disorder), recurrent episode, severe (HCC) 04/08/2020  . Thoughts of self harm 02/01/2020  . Abdominal pain 03/20/2018  . Cocaine use disorder, moderate, dependence (HCC) 01/01/2018  . Cannabis use disorder, moderate, dependence (HCC) 01/01/2018  . Pregnant 12/31/2017  . Medically noncompliant 12/31/2017  . Tobacco use disorder 01/12/2016  . Stimulant use disorder (HCC) (cocaine) 01/12/2016  . Alcohol use  disorder, moderate, dependence (HCC) 01/12/2016  . Self-inflicted laceration of wrist (HCC) 01/11/2016    Past Surgical History:  Past Surgical History:  Procedure Laterality Date  . CESAREAN SECTION  2000, 2002, 2013  . WISDOM TOOTH EXTRACTION      Gynecologic History:   Obstetric History: [id number]  Family History:  Family History  Problem Relation Age of Onset  . Diabetes Mother     Social History:  Social History   Socioeconomic History  . Marital status: Single    Spouse name: Not on file  . Number of children: Not on file  . Years of education: Not on file  . Highest education level: Not on file  Occupational History  . Not on file  Tobacco Use  . Smoking status: Former Smoker    Packs/day: 0.50  . Smokeless tobacco: Never Used  Substance and Sexual Activity  . Alcohol use: Not Currently    Comment: social drinker  . Drug use: Not Currently    Types: Cocaine, Marijuana    Comment: Positive UDS 12/2017  . Sexual activity: Not Currently    Birth control/protection: None  Other Topics Concern  . Not on file  Social History Narrative  . Not on file   Social Determinants of Health   Financial Resource Strain:   . Difficulty of Paying Living Expenses:   Food Insecurity:   . Worried About Programme researcher, broadcasting/film/video in the Last Year:   . Barista in the Last Year:   Transportation Needs:   . Freight forwarder (Medical):   Marland Kitchen Lack of Transportation (Non-Medical):   Physical Activity:   . Days of Exercise per Week:   . Minutes of Exercise per Session:   Stress:   . Feeling of Stress :   Social Connections:   . Frequency of Communication with Friends and Family:   . Frequency of Social Gatherings with Friends and Family:   . Attends Religious Services:   . Active Member of Clubs or Organizations:   . Attends Banker Meetings:   Marland Kitchen Marital Status:   Intimate Partner Violence:   . Fear of Current or Ex-Partner:   . Emotionally Abused:     Marland Kitchen Physically Abused:   . Sexually Abused:     Allergies:  Allergies  Allergen Reactions  . Aspirin Anaphylaxis, Diarrhea and Nausea And Vomiting  . Banana Anaphylaxis, Itching, Shortness Of Breath and Swelling  . Other Itching, Shortness Of Breath and Swelling  . Peanut Oil Itching, Shortness Of Breath and Swelling  . Peanut-Containing Drug Products Shortness Of Breath, Itching and Swelling  . Pecan Nut (Diagnostic) Anaphylaxis    Medications: Prior to Admission medications   Medication Sig Start Date End Date Taking? Authorizing Provider  Prenatal Vit-Fe Fumarate-FA (PRENATAL MULTIVITAMIN) TABS tablet Take 1 tablet by mouth daily at 12 noon.    [provider]  sertraline (ZOLOFT)  50 MG tablet Take 50 mg by mouth daily.    [provider]    Physical Exam Vitals: Blood pressure (!) 136/80, pulse 90, temperature 97.9 F (36.6 C), temperature source Oral, resp. rate 14, height 4\' 11"  (1.499 m), weight 76 kg, last menstrual period 12/04/2019, SpO2 100 %.  Baseline: 140 Variability: moderate Accelerations: absent Decelerations: absent Tocometry: none The patient was monitored for 30 minutes, fetal heart rate tracing was deemed , category I tracing,  Labs: Results for orders placed or performed during the hospital encounter of 07/18/20 (from the past 72 hour(s))  Hemoglobin A1c     Status: None   Collection Time: 07/21/20 12:01 PM  Result Value Ref Range   Hgb A1c MFr Bld 5.4 4.8 - 5.6 %    Comment: (NOTE)         Prediabetes: 5.7 - 6.4         Diabetes: >6.4         Glycemic control for adults with diabetes: <7.0    Mean Plasma Glucose 108 mg/dL    Comment: (NOTE) Performed At: [facility] [address] [place], [place] [id number] MD Jolene Schimke   Glucose, capillary     Status: None   Collection Time: 07/22/20  6:56 AM  Result Value Ref Range   Glucose-Capillary 82 70 - 99 mg/dL    Comment: Glucose reference range  applies only to samples taken after fasting for at least 8 hours.   Comment 1 Notify RN   Glucose, capillary     Status: Abnormal   Collection Time: 07/22/20 11:54 AM  Result Value Ref Range   Glucose-Capillary 103 (H) 70 - 99 mg/dL    Comment: Glucose reference range applies only to samples taken after fasting for at least 8 hours.    Imaging 07/24/20 OB Limited  Result Date: 07/18/2020 CLINICAL DATA:  Gestational diabetes.  Fetal tachycardia. EXAM: LIMITED OBSTETRIC ULTRASOUND FINDINGS: Number of Fetuses: 1 Heart Rate:  157 bpm Movement: Yes Presentation: Cephalic Placental Location: Anterior Previa: No Amniotic Fluid (Subjective):  Within normal limits. AFI: 11.4 cm BPD: 7.43 cm 29 w  6 d MATERNAL FINDINGS: Cervix:  Not evaluated Uterus/Adnexae: No abnormality visualized. IMPRESSION: 1. Single live IUP at 29 weeks and 6 days. The fetal heart rate is 157 beats per minute. 2. Normal AFI. 3. BPP of 8/8 4. Cervix not evaluated on this exam. This exam is performed on an emergent basis and does not comprehensively evaluate fetal size, dating, or anatomy; follow-up complete OB 07/20/2020 should be considered if further fetal assessment is warranted. Electronically Signed   By: [place] M.D.   On: 07/18/2020 01:49   07/20/2020 FETAL BPP WO NON STRESS  Result Date: 07/18/2020 CLINICAL DATA:  Gestational diabetes.  Fetal tachycardia. EXAM: LIMITED OBSTETRIC ULTRASOUND FINDINGS: Number of Fetuses: 1 Heart Rate:  157 bpm Movement: Yes Presentation: Cephalic Placental Location: Anterior Previa: No Amniotic Fluid (Subjective):  Within normal limits. AFI: 11.4 cm BPD: 7.43 cm 29 w  6 d MATERNAL FINDINGS: Cervix:  Not evaluated Uterus/Adnexae: No abnormality visualized. IMPRESSION: 1. Single live IUP at 29 weeks and 6 days. The fetal heart rate is 157 beats per minute. 2. Normal AFI. 3. BPP of 8/8 4. Cervix not evaluated on this exam. This exam is performed on an emergent basis and does not comprehensively evaluate fetal  size, dating, or anatomy; follow-up complete OB 07/20/2020 should be considered if further fetal assessment is warranted. Electronically Signed   By:  Katherine Mantlehristopher  Green M.D.   On: 07/18/2020 01:49   US Venous Img Lower Unilateral Right  Result Date: 07/01/2020 CLINICAL DATA:  Right upper leg pain EXAM: Right LOWER EXTREMITY VENOUS DOPPLER ULTRASOUND TECHNIQUE: Gray-scale sonography with compression, as well as color and duplex ultrasound, were performed to evaluate the deep venous system(s) from the level of the common femoral vein through the popliteal and proximal calf veins. COMPARISON:  None. FINDINGS: VENOUS Normal compressibility of the common femoral, superficial femoral, and popliteal veins, as well as the visualized calf veins. Visualized portions of profunda femoral vein and great saphenous vein unremarkable. No filling defects to suggest DVT on grayscale or color Doppler imaging. Doppler waveforms show normal direction of venous flow, normal respiratory plasticity and response to augmentation. Limited views of the contralateral common femoral vein are unremarkable. OTHER None. Limitations: none IMPRESSION: Negative. Electronically Signed   By: Jasmine PangKim  Fujinaga M.D.   On: 07/01/2020 22:29    Assessment: 34 y.o. [id number] at 5259w0d with discomforts of pregnancy  Plan:  1) Abdominal pain - no evidence of contraction on monitoring, nursing discussed discomforts of pregnancy, round ligament pain with patient  2) Fetus - cat I tracing  3) Gestational diabetes - [facility] confirmatory 3-hr - admission Hgb 5.4 with CBG so far this admission normal - switched to gestational diabetic carb modified diet - CBG AM fasting, and 2-hr post breakfast, lunch, dinner - Growth 1118g c/w 27% at 28 weeks on 06/22/2020 - Follow up growth scan ordered - Would suggest maternal fetal medicine consult if still inpatient on Monday.  [facility] Monday[facility] Thursdays  4) Delivery  timing - in the absence of other obstetrical indication delivery would be scheduled for 39 weeks.  Given her history of prior C-section this would need to be done via repeat C-section    Vena AustriaAndreas Tatia Petrucci, MD, Merlinda FrederickFACOG [facility], [facility] 07/23/2020, 10:34 PM

## 2020-07-23 NOTE — Plan of Care (Signed)
Patient presents with a better affect tonight than previously when this writer had her on the unit  Problem: Education: Goal: Emotional status will improve Outcome: Progressing Goal: Mental status will improve Outcome: Progressing

## 2020-07-23 NOTE — Progress Notes (Signed)
[facility] MD Progress Note  07/23/2020 11:48 AM Hailly Fess Scales  MRN:  [id number] Subjective:  Patient is a 34 year old female with a past psychiatric history significant for substance abuse, depression in pregnancy who was admitted on 7/23/2021because of long-term substance abuse in her pregnancy.  Objective: Patient is seen and examined.  Patient is a 34 year old female with the above-stated past psychiatric history seen in follow-up.  She had a period of agitation last night, but did not have to be medicated.  She was calm her for the rest the evening, and is back to her baseline this morning.  I discussed the case with the behavioral health hospital medical director, and apparently social services will not do anything with regard to safety issues until after the child is born.  The patient is voluntary.  I have forwarded this information to social work to discuss housing issues.  She has previously refused to go to a substance abuse facility because she does not see that as a problem.  I have also sent a message to the last obstetrical physician who saw the patient about the possibility of when induction could be done.  I suspect is probably around 36 weeks.  She denied any auditory or visual hallucinations.  She denied any suicidal or homicidal ideation.  Her blood pressure had been relatively low, but today is up to 136/80.  She is afebrile.  Pulse oximetry is 100% on room air.  She slept 7.5 hours.  No new laboratories.  Principal Problem: <principal problem not specified> Diagnosis: Active Problems:   MDD (major depressive disorder), recurrent severe, without psychosis (HCC)  Total Time spent with patient: 15 minutes  Past Psychiatric History: See admission H&P  Past Medical History:  Past Medical History:  Diagnosis Date  . Anxiety   . Depression   . Gestational diabetes   . History of substance abuse (HCC)   . History of suicide attempt   . History of thyroid disease   . Hypertension    . Thyroid disease     Past Surgical History:  Procedure Laterality Date  . CESAREAN SECTION  2000, 2002, 2013  . WISDOM TOOTH EXTRACTION     Family History:  Family History  Problem Relation Age of Onset  . Diabetes Mother    Family Psychiatric  History: See admission H&P Social History:  Social History   Substance and Sexual Activity  Alcohol Use Not Currently   Comment: social drinker     Social History   Substance and Sexual Activity  Drug Use Not Currently  . Types: Cocaine, Marijuana   Comment: Positive UDS 12/2017    Social History   Socioeconomic History  . Marital status: Single    Spouse name: Not on file  . Number of children: Not on file  . Years of education: Not on file  . Highest education level: Not on file  Occupational History  . Not on file  Tobacco Use  . Smoking status: Former Smoker    Packs/day: 0.50  . Smokeless tobacco: Never Used  Substance and Sexual Activity  . Alcohol use: Not Currently    Comment: social drinker  . Drug use: Not Currently    Types: Cocaine, Marijuana    Comment: Positive UDS 12/2017  . Sexual activity: Not Currently    Birth control/protection: None  Other Topics Concern  . Not on file  Social History Narrative  . Not on file   Social Determinants of Health   Financial Resource Strain:   .  Difficulty of Paying Living Expenses:   Food Insecurity:   . Worried About Programme researcher, broadcasting/film/video in the Last Year:   . Barista in the Last Year:   Transportation Needs:   . Freight forwarder (Medical):   Marland Kitchen Lack of Transportation (Non-Medical):   Physical Activity:   . Days of Exercise per Week:   . Minutes of Exercise per Session:   Stress:   . Feeling of Stress :   Social Connections:   . Frequency of Communication with Friends and Family:   . Frequency of Social Gatherings with Friends and Family:   . Attends Religious Services:   . Active Member of Clubs or Organizations:   . Attends Tax inspector Meetings:   Marland Kitchen Marital Status:    Additional Social History:                         Sleep: Good  Appetite:  Good  Current Medications: Current Facility-Administered Medications  Medication Dose Route Frequency Provider Last Rate Last Admin  . acetaminophen (TYLENOL) tablet 650 mg  650 mg Oral Q6H PRN Jearld Lesch, NP   650 mg at 07/23/20 0808  . alum & mag hydroxide-simeth (MAALOX/MYLANTA) 200-200-20 MG/5ML suspension 30 mL  30 mL Oral Q4H PRN Dixon, Rashaun M, NP      . ARIPiprazole (ABILIFY) tablet 5 mg  5 mg Oral QHS Roselind Messier, MD   5 mg at 07/22/20 2131  . ARIPiprazole ER (ABILIFY MAINTENA) injection 400 mg  400 mg Intramuscular Once Roselind Messier, MD      . calcium carbonate (TUMS - dosed in mg elemental calcium) chewable tablet 200 mg of elemental calcium  1 tablet Oral BID Antonieta Pert, MD   200 mg of elemental calcium at 07/23/20 0808  . cyclobenzaprine (FLEXERIL) tablet 5 mg  5 mg Oral TID PRN Roselind Messier, MD      . diphenhydrAMINE (BENADRYL) capsule 50 mg  50 mg Oral Q6H PRN Jearld Lesch, NP   50 mg at 07/21/20 2208  . haloperidol (HALDOL) tablet 5 mg  5 mg Oral Once PRN Antonieta Pert, MD       Or  . haloperidol lactate (HALDOL) injection 5 mg  5 mg Intramuscular Once PRN Antonieta Pert, MD      . magnesium hydroxide (MILK OF MAGNESIA) suspension 30 mL  30 mL Oral Daily PRN Jearld Lesch, NP      . prenatal multivitamin tablet 1 tablet  1 tablet Oral Q1200 Jearld Lesch, NP   1 tablet at 07/23/20 1119  . sertraline (ZOLOFT) tablet 50 mg  50 mg Oral Daily Dixon, Rashaun M, NP   50 mg at 07/23/20 0808  . traZODone (DESYREL) tablet 50 mg  50 mg Oral QHS Roselind Messier, MD   50 mg at 07/22/20 2131    Lab Results:  Results for orders placed or performed during the hospital encounter of 07/18/20 (from the past 48 hour(s))  Hemoglobin A1c     Status: None   Collection Time: 07/21/20 12:01 PM  Result Value Ref  Range   Hgb A1c MFr Bld 5.4 4.8 - 5.6 %    Comment: (NOTE)         Prediabetes: 5.7 - 6.4         Diabetes: >6.4         Glycemic control for adults with diabetes: <7.0  Mean Plasma Glucose 108 mg/dL    Comment: (NOTE) Performed At: [facility] [address] [place], [place] [id number] Jolene Schimke MD WU:[id number]   Glucose, capillary     Status: None   Collection Time: 07/22/20  6:56 AM  Result Value Ref Range   Glucose-Capillary 82 70 - 99 mg/dL    Comment: Glucose reference range applies only to samples taken after fasting for at least 8 hours.   Comment 1 Notify RN   Glucose, capillary     Status: Abnormal   Collection Time: 07/22/20 11:54 AM  Result Value Ref Range   Glucose-Capillary 103 (H) 70 - 99 mg/dL    Comment: Glucose reference range applies only to samples taken after fasting for at least 8 hours.    Blood Alcohol level:  Lab Results  Component Value Date   ETH 131 (H) 07/15/2020   ETH 89 (H) 06/02/2020    Metabolic Disorder Labs: Lab Results  Component Value Date   HGBA1C 5.4 07/21/2020   MPG 108 07/21/2020   MPG 111.15 06/05/2020   No results found for: PROLACTIN Lab Results  Component Value Date   CHOL 218 (H) 01/02/2018   TRIG 187 (H) 01/02/2018   HDL 81 01/02/2018   CHOLHDL 2.7 01/02/2018   VLDL 37 01/02/2018   LDLCALC 100 (H) 01/02/2018   LDLCALC 82 01/12/2016    Physical Findings: AIMS:  , ,  ,  ,    CIWA:    COWS:     Musculoskeletal: Strength & Muscle Tone: within normal limits Gait & Station: normal Patient leans: N/A  Psychiatric Specialty Exam: Physical Exam Vitals and nursing note reviewed.  Constitutional:      Appearance: Normal appearance.  HENT:     Head: Normocephalic and atraumatic.  Pulmonary:     Effort: Pulmonary effort is normal.  Neurological:     General: No focal deficit present.     Mental Status: She is alert and oriented to person, place, and time.     Review of Systems  Blood  pressure (!) 136/80, pulse 90, temperature 97.9 F (36.6 C), temperature source Oral, resp. rate 14, height 4\' 11"  (1.499 m), weight 76 kg, last menstrual period 12/04/2019, SpO2 100 %.Body mass index is 33.84 kg/m.  General Appearance: Casual  Eye Contact:  Fair  Speech:  Normal Rate  Volume:  Decreased  Mood:  Euthymic  Affect:  Congruent  Thought Process:  Coherent and Descriptions of Associations: Intact  Orientation:  Full (Time, Place, and Person)  Thought Content:  Logical  Suicidal Thoughts:  No  Homicidal Thoughts:  No  Memory:  Immediate;   Fair Recent;   Fair Remote;   Fair  Judgement:  Impaired  Insight:  Lacking  Psychomotor Activity:  Normal  Concentration:  Concentration: Fair and Attention Span: Fair  Recall:  14/09/2019 of Knowledge:  Good  Language:  Good  Akathisia:  Negative  Handed:  Right  AIMS (if indicated):     Assets:  Desire for Improvement Resilience  ADL's:  Intact  Cognition:  WNL  Sleep:  Number of Hours: 7.5     Treatment Plan Summary: Daily contact with patient to assess and evaluate symptoms and progress in treatment, Medication management and Plan : Patient is seen and examined.  Patient is a 34 year old female with the above-stated past psychiatric history who is seen in follow-up.   Diagnosis: 1. Polysubstance dependence. 2. Reported history of bipolar disorder. 3. Personality disorder unspecified 4.  Intrauterine pregnancy at approximately 33 weeks  Pertinent findings on examination today: 1.  Less irritable today. 2.  Denies auditory, visual hallucinations denies suicidal or homicidal ideation. 3.  Blood pressure slightly elevated this morning.  Plan: 1. Continue Abilify 5 mg p.o. nightly for mood stability. 2. Long-acting Abilify injection 400 mg IM scheduled for 7/26 x 1 dose for mood stability but patient has refused to this point. 3. Continue Cogentin 0.5 mg p.o. twice daily for side effects of medication. 4.  Continue Flexeril 5 mg p.o. 3 times daily as needed muscle spasms. 5.Continue sertraline 50 mg p.o. daily for depression and anxiety. 6. Continue trazodone 50 mg p.o. nightly for insomnia. 7. Continue Tums for reflux. 8.Disposition planning-in progress. Antonieta PertGreg Lawson Khadijah Mastrianni, MD 07/23/2020, 11:48 AM

## 2020-07-23 NOTE — Progress Notes (Signed)
Patient pleasant during assessment denying SI/HI/AVH. Patient endorsees pain related to her pregnancy. OB checked assessed her. Patient compliant with medication administration per MD orders. Patient observed interacting appropriately with staff and peers on the unit. Patient being monitored Q 15 minutes for safety per unit protocol. Patient remains safe on the unit.  

## 2020-07-23 NOTE — Progress Notes (Signed)
Patient complained of abdominal pain and pressure relating from her pregnancy. NP notified and consult was put in for OB to check her. They are on the unit assessing her at this time.

## 2020-07-23 NOTE — Progress Notes (Signed)
Recreation Therapy Notes   Date: 07/23/2020  Time: 9:30 am   Location: Court yard   Behavioral response: N/A   Intervention Topic: Social Skills   Discussion/Intervention: Patient did not attend group.   Clinical Observations/Feedback:  Patient did not attend group.   Tagan Bartram LRT/CTRS        Evelean Bigler 07/23/2020 12:09 PM

## 2020-07-24 ENCOUNTER — Inpatient Hospital Stay: Payer: Medicaid Other

## 2020-07-24 ENCOUNTER — Other Ambulatory Visit: Payer: Self-pay

## 2020-07-24 LAB — GLUCOSE, CAPILLARY
Glucose-Capillary: 86 mg/dL (ref 70–99)
Glucose-Capillary: 77 mg/dL (ref 70–99)
Glucose-Capillary: 100 mg/dL — ABNORMAL HIGH (ref 70–99)
Glucose-Capillary: 81 mg/dL (ref 70–99)

## 2020-07-24 NOTE — Plan of Care (Signed)
  Problem: Education: Goal: Knowledge of [facility] Education information/materials will improve Outcome: Progressing Goal: Emotional status will improve Outcome: Progressing Goal: Mental status will improve Outcome: Progressing Goal: Verbalization of understanding the information provided will improve Outcome: Progressing   Problem: Activity: Goal: Interest or engagement in activities will improve Outcome: Progressing Goal: Sleeping patterns will improve Outcome: Progressing   Problem: Coping: Goal: Ability to verbalize frustrations and anger appropriately will improve Outcome: Progressing Goal: Ability to demonstrate self-control will improve Outcome: Progressing   Problem: Health Behavior/Discharge Planning: Goal: Identification of resources available to assist in meeting health care needs will improve Outcome: Progressing Goal: Compliance with treatment plan for underlying cause of condition will improve Outcome: Progressing   Problem: Physical Regulation: Goal: Ability to maintain clinical measurements within normal limits will improve Outcome: Progressing   Problem: Safety: Goal: Periods of time without injury will increase Outcome: Progressing   Problem: Education: Goal: Utilization of techniques to improve thought processes will improve Outcome: Progressing Goal: Knowledge of the prescribed therapeutic regimen will improve Outcome: Progressing   Problem: Activity: Goal: Interest or engagement in leisure activities will improve Outcome: Progressing Goal: Imbalance in normal sleep/wake cycle will improve Outcome: Progressing   Problem: Coping: Goal: Coping ability will improve Outcome: Progressing Goal: Will verbalize feelings Outcome: Progressing   Problem: Health Behavior/Discharge Planning: Goal: Ability to make decisions will improve Outcome: Progressing Goal: Compliance with therapeutic regimen will improve Outcome: Progressing    Problem: Role Relationship: Goal: Will demonstrate positive changes in social behaviors and relationships Outcome: Progressing   Problem: Safety: Goal: Ability to disclose and discuss suicidal ideas will improve Outcome: Progressing Goal: Ability to identify and utilize support systems that promote safety will improve Outcome: Progressing   Problem: Self-Concept: Goal: Will verbalize positive feelings about self Outcome: Progressing Goal: Level of anxiety will decrease Outcome: Progressing   

## 2020-07-24 NOTE — Progress Notes (Addendum)
Pt is alert and oriented to person, place, time and situation. Pt is calm, cooperative, pleasant, out for meals, reports she slept well, is medication complaint, spends time talking quietly with a peer in the breakfast area. Pt goes to her room to rest after breakfast. Pt appetite is good, pt reports resting well last night. Pt denies pain or discomfort at this time. Pt denies suicidal and homicidal ideation, denies hallucinations, denies feelings of depression and anxiety. Pt is childlike at times. No distress noted and none reported. Will continue to monitor pt per Q15 minute face checks and monitor for safety and progress. Pt reminded pt to provide urine, pt has not yet produced, reported it to the next shift for follow up.

## 2020-07-24 NOTE — Progress Notes (Signed)
[facility] MD Progress Note  07/24/2020 1:44 PM [name]  MRN:  [id number]  Principal Problem: <principal problem not specified> Diagnosis: Active Problems:   Abdominal pain in pregnancy, third trimester   MDD (major depressive disorder), recurrent severe, without psychosis (HCC)   Supervision of high risk pregnancy, antepartum   Substance abuse complicating pregnancy, antepartum   Bipolar disease in pregnancy, unspecified trimester (HCC)   Genital herpes affecting pregnancy   Abnormal glucose tolerance test in pregnancy   High risk pregnancy due to history of preterm labor, antepartum   Chronic hypertension during pregnancy, antepartum   History of prior pregnancy with IUGR newborn   Asthma affecting pregnancy, antepartum   History of fetal anomaly in prior pregnancy, currently pregnant   Diet controlled gestational diabetes mellitus (GDM) in third trimester   [name] is a  34y.o. pregnant female that has a previous psychiatric history of substance abuse, depression in pregnancy who was admitted on 7/23/2021because of long-term substance abuse in her pregnancy.  Interval History Patient was seen today for re-evaluation.  Nursing reports no events overnight. The patient reports no issues with performing ADLs.  Patient has been medication compliant.  The patient reports no side effects from medications.  Current symptoms being addressed include:  ...  Since last assessment, patient reports symptoms have improved.    SUBJECTIVE: On assessment patient reports "my belly is hurting", "I am okay otherwise". She is calm, cooperative, she is in "good" mood.  her for the rest the evening, and is back to her baseline this morning. She denied any auditory or visual hallucinations.  She denied any suicidal or homicidal ideation. She is med-compliant and denies any side effects. She had an obstetrics ultrasound today and the result is pending. She is willing to be transferred to [facility]  unit.  Current suicidal/homicidal ideations: Denies Current auditory/visual hallucinations: Denies  Review Of Systems: A complete review of systems of the following systems was conducted (Constitutional, Psychiatric, Neurological, Musculoskeletal, Eyes, Gastrointestinal, Cardiovascular, Respiratory, Skin, and Endocrine). All reviewed systems are negative except pertinent positives identified in the HPI.  Labs: blood glucose 86, 77.   Total Time spent with patient: 20 minutes  Past Psychiatric History: see H&P  Past Medical History:  Past Medical History:  Diagnosis Date  . Anxiety   . Depression   . Gestational diabetes   . History of substance abuse (HCC)   . History of suicide attempt   . History of thyroid disease   . Hypertension   . Thyroid disease     Past Surgical History:  Procedure Laterality Date  . CESAREAN SECTION  2000, 2002, 2013  . WISDOM TOOTH EXTRACTION     Family History:  Family History  Problem Relation Age of Onset  . Diabetes Mother    Family Psychiatric  History: see H&P Social History:  Social History   Substance and Sexual Activity  Alcohol Use Not Currently   Comment: social drinker     Social History   Substance and Sexual Activity  Drug Use Not Currently  . Types: Cocaine, Marijuana   Comment: Positive UDS 12/2017    Social History   Socioeconomic History  . Marital status: Single    Spouse name: Not on file  . Number of children: Not on file  . Years of education: Not on file  . Highest education level: Not on file  Occupational History  . Not on file  Tobacco Use  . Smoking status: Former Smoker  Packs/day: 0.50  . Smokeless tobacco: Never Used  Substance and Sexual Activity  . Alcohol use: Not Currently    Comment: social drinker  . Drug use: Not Currently    Types: Cocaine, Marijuana    Comment: Positive UDS 12/2017  . Sexual activity: Not Currently    Birth control/protection: None  Other Topics Concern  . Not  on file  Social History Narrative  . Not on file   Social Determinants of Health   Financial Resource Strain:   . Difficulty of Paying Living Expenses:   Food Insecurity:   . Worried About Programme researcher, broadcasting/film/video in the Last Year:   . Barista in the Last Year:   Transportation Needs:   . Freight forwarder (Medical):   Marland Kitchen Lack of Transportation (Non-Medical):   Physical Activity:   . Days of Exercise per Week:   . Minutes of Exercise per Session:   Stress:   . Feeling of Stress :   Social Connections:   . Frequency of Communication with Friends and Family:   . Frequency of Social Gatherings with Friends and Family:   . Attends Religious Services:   . Active Member of Clubs or Organizations:   . Attends Banker Meetings:   Marland Kitchen Marital Status:    Additional Social History:                         Sleep: Good  Appetite:  Good  Current Medications: Current Facility-Administered Medications  Medication Dose Route Frequency Provider Last Rate Last Admin  . acetaminophen (TYLENOL) tablet 650 mg  650 mg Oral Q6H PRN Jearld Lesch, NP   650 mg at 07/23/20 2125  . alum & mag hydroxide-simeth (MAALOX/MYLANTA) 200-200-20 MG/5ML suspension 30 mL  30 mL Oral Q4H PRN Dixon, Rashaun M, NP   30 mL at 07/24/20 1108  . ARIPiprazole (ABILIFY) tablet 5 mg  5 mg Oral QHS Roselind Messier, MD   5 mg at 07/23/20 2125  . ARIPiprazole ER (ABILIFY MAINTENA) injection 400 mg  400 mg Intramuscular Once Roselind Messier, MD      . calcium carbonate (TUMS - dosed in mg elemental calcium) chewable tablet 200 mg of elemental calcium  1 tablet Oral BID Antonieta Pert, MD   200 mg of elemental calcium at 07/24/20 0824  . cyclobenzaprine (FLEXERIL) tablet 5 mg  5 mg Oral TID PRN Roselind Messier, MD      . diphenhydrAMINE (BENADRYL) capsule 50 mg  50 mg Oral Q6H PRN Jearld Lesch, NP   50 mg at 07/23/20 2259  . haloperidol (HALDOL) tablet 5 mg  5 mg Oral Once PRN Antonieta Pert, MD       Or  . haloperidol lactate (HALDOL) injection 5 mg  5 mg Intramuscular Once PRN Antonieta Pert, MD      . magnesium hydroxide (MILK OF MAGNESIA) suspension 30 mL  30 mL Oral Daily PRN Jearld Lesch, NP      . prenatal multivitamin tablet 1 tablet  1 tablet Oral Q1200 Jearld Lesch, NP   1 tablet at 07/24/20 1109  . sertraline (ZOLOFT) tablet 50 mg  50 mg Oral Daily Dixon, Rashaun M, NP   50 mg at 07/24/20 0824  . traZODone (DESYREL) tablet 50 mg  50 mg Oral QHS Roselind Messier, MD   50 mg at 07/22/20 2131    Lab Results:  Results for orders  placed or performed during the hospital encounter of 07/18/20 (from the past 48 hour(s))  Glucose, capillary     Status: None   Collection Time: 07/24/20  7:04 AM  Result Value Ref Range   Glucose-Capillary 86 70 - 99 mg/dL    Comment: Glucose reference range applies only to samples taken after fasting for at least 8 hours.   Comment 1 Notify RN   Glucose, capillary     Status: None   Collection Time: 07/24/20 11:13 AM  Result Value Ref Range   Glucose-Capillary 77 70 - 99 mg/dL    Comment: Glucose reference range applies only to samples taken after fasting for at least 8 hours.    Blood Alcohol level:  Lab Results  Component Value Date   ETH 131 (H) 07/15/2020   ETH 89 (H) 06/02/2020    Metabolic Disorder Labs: Lab Results  Component Value Date   HGBA1C 5.4 07/21/2020   MPG 108 07/21/2020   MPG 111.15 06/05/2020   No results found for: PROLACTIN Lab Results  Component Value Date   CHOL 218 (H) 01/02/2018   TRIG 187 (H) 01/02/2018   HDL 81 01/02/2018   CHOLHDL 2.7 01/02/2018   VLDL 37 01/02/2018   LDLCALC 100 (H) 01/02/2018   LDLCALC 82 01/12/2016    Physical Findings: AIMS:  , ,  ,  ,    CIWA:    COWS:     Musculoskeletal: Strength & Muscle Tone: within normal limits Gait & Station: normal Patient leans: N/A  Psychiatric Specialty Exam: Physical Exam  Review of Systems  Blood pressure  (!) 102/56, pulse 68, temperature 98.2 F (36.8 C), temperature source Oral, resp. rate 18, height 4\' 11"  (1.499 m), weight 76 kg, last menstrual period 12/04/2019, SpO2 100 %.Body mass index is 33.84 kg/m.  General Appearance: Casual  Eye Contact:  Good  Speech:  Normal Rate  Volume:  Normal  Mood:  Euthymic  Affect:  Congruent and Full Range  Thought Process:  Coherent, Goal Directed and Linear  Orientation:  Full (Time, Place, and Person)  Thought Content:  Logical  Suicidal Thoughts:  No  Homicidal Thoughts:  No  Memory:  Immediate;   Fair Recent;   Fair Remote;   Fair  Judgement:  Good  Insight:  Good  Psychomotor Activity:  Normal  Concentration:  Concentration: Fair and Attention Span: Fair  Recall:  14/09/2019 of Knowledge:  Fair  Language:  Good  Akathisia:  No  Handed:  Right  AIMS (if indicated):     Assets:  Communication Skills Desire for Improvement Social Support  ADL's:  Intact  Cognition:  WNL  Sleep:  Number of Hours: 7     Treatment Plan Summary: Daily contact with patient to assess and evaluate symptoms and progress in treatment and Medication management   Patient is a89 year old female/female with the above-stated past psychiatric history who is seen in follow-up.  Chart reviewed. Patient discussed with nursing. Patient is calm, cooperative and does not express any complaints today. She did not show any unsafe behaviors. She has an obstetrics U/S today and results are pending at this time. She consulted by ObGyn physician yesterday who suggested maternal fetal medicine consult if still inpatient on Monday. No psych medication changes today.   Plan: -continue inpatient psych admission; 15-minute checks; daily contact with patient to assess and evaluate symptoms and progress in treatment; psychoeducation.  -continue scheduled psych medications: . ARIPiprazole  5 mg Oral QHS  . ARIPiprazole  ER  400 mg Intramuscular Once  . calcium carbonate  1 tablet  Oral BID  . prenatal multivitamin  1 tablet Oral Q1200  . sertraline  50 mg Oral Daily  . traZODone  50 mg Oral QHS   -continue PRN medications. acetaminophen, alum & mag hydroxide-simeth, cyclobenzaprine, diphenhydrAMINE, haloperidol **OR** haloperidol lactate, magnesium hydroxide   -Disposition: to be determined. Likely d/c home with outpatient psych follow-up in   days when patient is stabilized.  Thalia PartyAlisa Stavroula Rohde, MD 07/24/2020, 1:44 PM

## 2020-07-24 NOTE — [facility] Counselor (Signed)
LCSW Group Therapy Note  07/24/2020   1:10 PM- 2:00 PM   Type of Therapy and Topic:  Group Therapy: Anger Cues and Responses  Participation Level:  Active   Description of Group:   In this group, patients learned how to recognize the physical, cognitive, emotional, and behavioral responses they have to anger-provoking situations.  They identified a recent time they became angry and how they reacted.  They analyzed how their reaction was possibly beneficial and how it was possibly unhelpful.  The group discussed a variety of healthier coping skills that could help with such a situation in the future.  Focus was placed on how helpful it is to recognize the underlying emotions to our anger, because working on those can lead to a more permanent solution as well as our ability to focus on the important rather than the urgent.  Therapeutic Goals: 1. Patients will remember their last incident of anger and how they felt emotionally and physically, what their thoughts were at the time, and how they behaved. 2. Patients will identify how their behavior at that time worked for them, as well as how it worked against them. 3. Patients will explore possible new behaviors to use in future anger situations. 4. Patients will learn that anger itself is normal and cannot be eliminated, and that healthier reactions can assist with resolving conflict rather than worsening situations.  Summary of Patient Progress: CSW asked the group to check-in and introduce themselves and talk about something good that is happening or something that they are working on. Patient checked into group stating she had nothing exciting going on. Patient was asked by another patient if being pregnant right now is something exciting and patient stated no "This is my 6th child". Patient spoke about the last time she was angry and flipped over a table. Patient spoke about the person she was talking to was not listening to her. Patient was able to  identify flipping a table did not work in her favor. Patient was able to identify walking and taking a moment for herself as a coping skill.   Therapeutic Modalities:   Cognitive Behavioral Therapy    Susa Simmonds, Theresia Majors 07/24/2020  3:47 PM

## 2020-07-24 NOTE — Progress Notes (Signed)
Patient is alert and oriented X4. Pleasant and cooperative at med administration. She denies SI/HI/AVH at this encounter. She does endorse anxiety, depression and pain which she rates as 7 on 0-10 scale. She received meds and tolerated without incident.  She remains safe on the unit with 15 minute safety checks and informed to contact staff with any concerns.    Cleo Butler-Nicholson, LPN

## 2020-07-25 LAB — GLUCOSE, CAPILLARY
Glucose-Capillary: 72 mg/dL (ref 70–99)
Glucose-Capillary: 83 mg/dL (ref 70–99)
Glucose-Capillary: 86 mg/dL (ref 70–99)

## 2020-07-25 NOTE — [facility] Group Notes (Signed)
[facility] LCSW Group Therapy Note  Date/Time:  07/25/2020 1315  Type of Therapy and Topic:  Group Therapy:  Healthy and Unhealthy Supports  Participation Level:  Minimal   Description of Group:  Patients in this group were introduced to the idea of adding a variety of healthy supports to address the various needs in their lives.Patients discussed what additional healthy supports could be helpful in their recovery and wellness after discharge in order to prevent future hospitalizations.   An emphasis was placed on using counselor, doctor, therapy groups, 12-step groups, and problem-specific support groups to expand supports.  They also worked as a group on developing a specific plan for several patients to deal with unhealthy supports through boundary-setting, psychoeducation with loved ones, and even termination of relationships.   Therapeutic Goals:   1)  discuss importance of adding supports to stay well once out of the hospital  2)  compare healthy versus unhealthy supports and identify some examples of each  3)  generate ideas and descriptions of healthy supports that can be added  4)  offer mutual support about how to address unhealthy supports  5)  encourage active participation in and adherence to discharge plan    Summary of Patient Progress:  The patient actively engaged in introductory check-in, sharing of feeling "A 4, cause they trying to keep me here and I'm not liking it". Pt actively engaged in processing understanding of support, sharing her personal definition as "when you call somebody and they come sit with you, take you where you need to go, do things to help you". Pt stated that she does not currently have any healthy supports in her life and this is the reason she is hospitalized. Pt proved receptive to alternate group members input and feedback from CSW. Pt requested to leave group prior to group closing and did not return.  Therapeutic Modalities:   Motivational  Interviewing Brief Solution-Focused Therapy  Leisa Lenz, LCSW 07/25/2020  2:49 PM

## 2020-07-25 NOTE — Progress Notes (Signed)
Pt is alert and oriented to person, place, time and situation. Pt is calm, minimally cooperative, denies suicidal and homicidal ideation. Pt was not cooperative with fingerstick CBGs. Pt supplied the urine with a great deal of encouragement. Pt denies suicidal and homicidal ideation, denies hallucinations. Pt has a good appetite and is medication complaint, denies pain and discomfort. Pt is social with peers, childlike at times. No distress noted, none reported, pt voices no complaints. Will continue to monitor pt per Q15 minute face checks and monitor for safety and progress.

## 2020-07-25 NOTE — Progress Notes (Signed)
[facility] MD Progress Note  07/25/2020 11:16 AM [name]  MRN:  [id number]  Principal Problem: <principal problem not specified> Diagnosis: Active Problems:   Abdominal pain in pregnancy, third trimester   MDD (major depressive disorder), recurrent severe, without psychosis (HCC)   Supervision of high risk pregnancy, antepartum   Substance abuse complicating pregnancy, antepartum   Bipolar disease in pregnancy, unspecified trimester (HCC)   Genital herpes affecting pregnancy   Abnormal glucose tolerance test in pregnancy   High risk pregnancy due to history of preterm labor, antepartum   Chronic hypertension during pregnancy, antepartum   History of prior pregnancy with IUGR newborn   Asthma affecting pregnancy, antepartum   History of fetal anomaly in prior pregnancy, currently pregnant   Diet controlled gestational diabetes mellitus (GDM) in third trimester   [name] is a  34y.o. pregnant female that has a previous psychiatric history of substance abuse, depression in pregnancy who was admitted on 7/23/2021because of long-term substance abuse in her pregnancy.  Interval History Patient was seen today for re-evaluation.  Nursing reports no events overnight. The patient reports no issues with performing ADLs.  Patient has been medication compliant.  The patient reports no side effects from medications. Since last assessment, patient reports symptoms have improved.    SUBJECTIVE: On assessment patient reports  "I am okay, just tired". She is calm, cooperative, she is in "okay" mood. She denied any auditory or visual hallucinations.  She denied any suicidal or homicidal ideation. She is med-compliant and denies any side effects. She had an obstetrics ultrasound yesterday and we discussed results. She is willing to be transferred to [facility] unit.  Current suicidal/homicidal ideations: Denies Current auditory/visual hallucinations: Denies  Review Of Systems: A complete review of  systems of the following systems was conducted (Constitutional, Psychiatric, Neurological, Musculoskeletal, Eyes, Gastrointestinal, Cardiovascular, Respiratory, Skin, and Endocrine). All reviewed systems are negative except pertinent positives identified in the HPI.  Labs: blood glucose 81, 72.  Obstertical U/S 07/24/20: 1. Single live intrauterine gestation in cephalic presentation with assigned gestational age of [redacted] weeks 2 days. 2. Findings of intrauterine growth restriction with estimated fetal weight within the 6th percentile. Biparietal diameter, head circumference, and femur length are all below the 3rd percentile. 3. Amniotic fluid index of 15.2 cm, within normal limits. 4. Fetal anatomic survey, as above, is limited secondary to gestational age. No fetal anomaly identified.   Total Time spent with patient: 20 minutes  Past Psychiatric History: see H&P  Past Medical History:  Past Medical History:  Diagnosis Date   Anxiety    Depression    Gestational diabetes    History of substance abuse (HCC)    History of suicide attempt    History of thyroid disease    Hypertension    Thyroid disease     Past Surgical History:  Procedure Laterality Date   CESAREAN SECTION  2000, 2002, 2013   WISDOM TOOTH EXTRACTION     Family History:  Family History  Problem Relation Age of Onset   Diabetes Mother    Family Psychiatric  History: see H&P Social History:  Social History   Substance and Sexual Activity  Alcohol Use Not Currently   Comment: social drinker     Social History   Substance and Sexual Activity  Drug Use Not Currently   Types: Cocaine, Marijuana   Comment: Positive UDS 12/2017    Social History   Socioeconomic History   Marital status: Single    Spouse name:  Not on file   Number of children: Not on file   Years of education: Not on file   Highest education level: Not on file  Occupational History   Not on file  Tobacco Use    Smoking status: Former Smoker    Packs/day: 0.50   Smokeless tobacco: Never Used  Substance and Sexual Activity   Alcohol use: Not Currently    Comment: social drinker   Drug use: Not Currently    Types: Cocaine, Marijuana    Comment: Positive UDS 12/2017   Sexual activity: Not Currently    Birth control/protection: None  Other Topics Concern   Not on file  Social History Narrative   Not on file   Social Determinants of Health   Financial Resource Strain:    Difficulty of Paying Living Expenses:   Food Insecurity:    Worried About Programme researcher, broadcasting/film/video in the Last Year:    Barista in the Last Year:   Transportation Needs:    Freight forwarder (Medical):    Lack of Transportation (Non-Medical):   Physical Activity:    Days of Exercise per Week:    Minutes of Exercise per Session:   Stress:    Feeling of Stress :   Social Connections:    Frequency of Communication with Friends and Family:    Frequency of Social Gatherings with Friends and Family:    Attends Religious Services:    Active Member of Clubs or Organizations:    Attends Banker Meetings:    Marital Status:    Additional Social History:                         Sleep: Good  Appetite:  Good  Current Medications: Current Facility-Administered Medications  Medication Dose Route Frequency Provider Last Rate Last Admin   acetaminophen (TYLENOL) tablet 650 mg  650 mg Oral Q6H PRN Jearld Lesch, NP   650 mg at 07/25/20 0735   alum & mag hydroxide-simeth (MAALOX/MYLANTA) 200-200-20 MG/5ML suspension 30 mL  30 mL Oral Q4H PRN Jearld Lesch, NP   30 mL at 07/24/20 1108   ARIPiprazole (ABILIFY) tablet 5 mg  5 mg Oral QHS Roselind Messier, MD   5 mg at 07/24/20 2150   ARIPiprazole ER (ABILIFY MAINTENA) injection 400 mg  400 mg Intramuscular Once Roselind Messier, MD       calcium carbonate (TUMS - dosed in mg elemental calcium) chewable tablet 200 mg of  elemental calcium  1 tablet Oral BID Antonieta Pert, MD   200 mg of elemental calcium at 07/25/20 0734   cyclobenzaprine (FLEXERIL) tablet 5 mg  5 mg Oral TID PRN Roselind Messier, MD       diphenhydrAMINE (BENADRYL) capsule 50 mg  50 mg Oral Q6H PRN Jearld Lesch, NP   50 mg at 07/23/20 2259   haloperidol (HALDOL) tablet 5 mg  5 mg Oral Once PRN Antonieta Pert, MD       Or   haloperidol lactate (HALDOL) injection 5 mg  5 mg Intramuscular Once PRN Antonieta Pert, MD       magnesium hydroxide (MILK OF MAGNESIA) suspension 30 mL  30 mL Oral Daily PRN Jearld Lesch, NP       prenatal multivitamin tablet 1 tablet  1 tablet Oral Q1200 Jearld Lesch, NP   1 tablet at 07/24/20 1109   sertraline (ZOLOFT) tablet 50 mg  50 mg Oral Daily Lerry Linerixon, Rashaun M, NP   50 mg at 07/25/20 0734   traZODone (DESYREL) tablet 50 mg  50 mg Oral QHS Roselind Messierao, Ramakrishna, MD   50 mg at 07/24/20 2150    Lab Results:  Results for orders placed or performed during the hospital encounter of 07/18/20 (from the past 48 hour(s))  Glucose, capillary     Status: None   Collection Time: 07/24/20  7:04 AM  Result Value Ref Range   Glucose-Capillary 86 70 - 99 mg/dL    Comment: Glucose reference range applies only to samples taken after fasting for at least 8 hours.   Comment 1 Notify RN   Glucose, capillary     Status: None   Collection Time: 07/24/20 11:13 AM  Result Value Ref Range   Glucose-Capillary 77 70 - 99 mg/dL    Comment: Glucose reference range applies only to samples taken after fasting for at least 8 hours.  Glucose, capillary     Status: Abnormal   Collection Time: 07/24/20  4:38 PM  Result Value Ref Range   Glucose-Capillary 100 (H) 70 - 99 mg/dL    Comment: Glucose reference range applies only to samples taken after fasting for at least 8 hours.  Glucose, capillary     Status: None   Collection Time: 07/24/20  8:39 PM  Result Value Ref Range   Glucose-Capillary 81 70 - 99 mg/dL     Comment: Glucose reference range applies only to samples taken after fasting for at least 8 hours.  Glucose, capillary     Status: None   Collection Time: 07/25/20  7:09 AM  Result Value Ref Range   Glucose-Capillary 72 70 - 99 mg/dL    Comment: Glucose reference range applies only to samples taken after fasting for at least 8 hours.   Comment 1 Notify RN     Blood Alcohol level:  Lab Results  Component Value Date   ETH 131 (H) 07/15/2020   ETH 89 (H) 06/02/2020    Metabolic Disorder Labs: Lab Results  Component Value Date   HGBA1C 5.4 07/21/2020   MPG 108 07/21/2020   MPG 111.15 06/05/2020   No results found for: PROLACTIN Lab Results  Component Value Date   CHOL 218 (H) 01/02/2018   TRIG 187 (H) 01/02/2018   HDL 81 01/02/2018   CHOLHDL 2.7 01/02/2018   VLDL 37 01/02/2018   LDLCALC 100 (H) 01/02/2018   LDLCALC 82 01/12/2016    Physical Findings: AIMS:  , ,  ,  ,    CIWA:    COWS:     Musculoskeletal: Strength & Muscle Tone: within normal limits Gait & Station: normal Patient leans: N/A  Psychiatric Specialty Exam: Physical Exam   Review of Systems   Blood pressure (!) 130/71, pulse 59, temperature 97.9 F (36.6 C), temperature source Oral, resp. rate 18, height 4\' 11"  (1.499 m), weight 76 kg, last menstrual period 12/04/2019, SpO2 100 %.Body mass index is 33.84 kg/m.  General Appearance: Casual  Eye Contact:  Good  Speech:  Normal Rate  Volume:  Normal  Mood:  Euthymic  Affect:  Congruent and Full Range  Thought Process:  Coherent, Goal Directed and Linear  Orientation:  Full (Time, Place, and Person)  Thought Content:  Logical  Suicidal Thoughts:  No  Homicidal Thoughts:  No  Memory:  Immediate;   Fair Recent;   Fair Remote;   Fair  Judgement:  Good  Insight:  Good  Psychomotor Activity:  Normal  Concentration:  Concentration: Fair and Attention Span: Fair  Recall:  [employer] of Knowledge:  Fair  Language:  Good  Akathisia:  No  Handed:   Right  AIMS (if indicated):     Assets:  Communication Skills Desire for Improvement Social Support  ADL's:  Intact  Cognition:  WNL  Sleep:  Number of Hours: 6.5     Treatment Plan Summary: Daily contact with patient to assess and evaluate symptoms and progress in treatment and Medication management   Patient is a49 year old female/female with the above-stated past psychiatric history who is seen in follow-up.  Chart reviewed. Patient discussed with nursing. Patient is calm, cooperative and does not express any complaints today. She did not show any unsafe behaviors. She has an obstetrics U/S yesterday. She consulted by ObGyn physician yesterday who suggested maternal fetal medicine consult if still inpatient on Monday. No psych medication changes today.   Plan: -continue inpatient psych admission; 15-minute checks; daily contact with patient to assess and evaluate symptoms and progress in treatment; psychoeducation.  -continue scheduled psych medications:  ARIPiprazole  5 mg Oral QHS   ARIPiprazole ER  400 mg Intramuscular Once   calcium carbonate  1 tablet Oral BID   prenatal multivitamin  1 tablet Oral Q1200   sertraline  50 mg Oral Daily   traZODone  50 mg Oral QHS   -continue PRN medications. acetaminophen, alum & mag hydroxide-simeth, cyclobenzaprine, diphenhydrAMINE, haloperidol **OR** haloperidol lactate, magnesium hydroxide   -Disposition: to be determined. Likely d/c home with outpatient psych follow-up in   days when patient is stabilized.  Thalia Party, MD 07/25/2020, 11:16 AM

## 2020-07-26 ENCOUNTER — Ambulatory Visit: Payer: Medicaid Other

## 2020-07-26 ENCOUNTER — Other Ambulatory Visit: Payer: Self-pay

## 2020-07-26 ENCOUNTER — Inpatient Hospital Stay (HOSPITAL_BASED_OUTPATIENT_CLINIC_OR_DEPARTMENT_OTHER): Payer: Medicaid Other

## 2020-07-26 VITALS — BP 110/65 | HR 66 | Temp 97.8°F | Ht 59.0 in | Wt 164.5 lb

## 2020-07-26 DIAGNOSIS — Z3A33 33 weeks gestation of pregnancy: Secondary | ICD-10-CM

## 2020-07-26 DIAGNOSIS — O09219 Supervision of pregnancy with history of pre-term labor, unspecified trimester: Secondary | ICD-10-CM

## 2020-07-26 DIAGNOSIS — O26899 Other specified pregnancy related conditions, unspecified trimester: Secondary | ICD-10-CM

## 2020-07-26 DIAGNOSIS — O24414 Gestational diabetes mellitus in pregnancy, insulin controlled: Secondary | ICD-10-CM

## 2020-07-26 DIAGNOSIS — F319 Bipolar disorder, unspecified: Secondary | ICD-10-CM

## 2020-07-26 DIAGNOSIS — O99810 Abnormal glucose complicating pregnancy: Secondary | ICD-10-CM

## 2020-07-26 DIAGNOSIS — O0990 Supervision of high risk pregnancy, unspecified, unspecified trimester: Secondary | ICD-10-CM

## 2020-07-26 DIAGNOSIS — O99323 Drug use complicating pregnancy, third trimester: Secondary | ICD-10-CM

## 2020-07-26 DIAGNOSIS — Z8759 Personal history of other complications of pregnancy, childbirth and the puerperium: Secondary | ICD-10-CM

## 2020-07-26 DIAGNOSIS — Z98891 History of uterine scar from previous surgery: Secondary | ICD-10-CM

## 2020-07-26 DIAGNOSIS — O98313 Other infections with a predominantly sexual mode of transmission complicating pregnancy, third trimester: Secondary | ICD-10-CM

## 2020-07-26 DIAGNOSIS — J45909 Unspecified asthma, uncomplicated: Secondary | ICD-10-CM

## 2020-07-26 DIAGNOSIS — O24419 Gestational diabetes mellitus in pregnancy, unspecified control: Secondary | ICD-10-CM

## 2020-07-26 DIAGNOSIS — R109 Unspecified abdominal pain: Secondary | ICD-10-CM

## 2020-07-26 DIAGNOSIS — O09523 Supervision of elderly multigravida, third trimester: Secondary | ICD-10-CM

## 2020-07-26 DIAGNOSIS — O10919 Unspecified pre-existing hypertension complicating pregnancy, unspecified trimester: Secondary | ICD-10-CM

## 2020-07-26 DIAGNOSIS — A6009 Herpesviral infection of other urogenital tract: Secondary | ICD-10-CM

## 2020-07-26 DIAGNOSIS — O99320 Drug use complicating pregnancy, unspecified trimester: Secondary | ICD-10-CM

## 2020-07-26 DIAGNOSIS — O365930 Maternal care for other known or suspected poor fetal growth, third trimester, not applicable or unspecified: Secondary | ICD-10-CM

## 2020-07-26 DIAGNOSIS — O99343 Other mental disorders complicating pregnancy, third trimester: Principal | ICD-10-CM

## 2020-07-26 DIAGNOSIS — O09299 Supervision of pregnancy with other poor reproductive or obstetric history, unspecified trimester: Secondary | ICD-10-CM

## 2020-07-26 DIAGNOSIS — O365931 Maternal care for other known or suspected poor fetal growth, third trimester, fetus 1: Secondary | ICD-10-CM

## 2020-07-26 LAB — GLUCOSE, CAPILLARY
Glucose-Capillary: 90 mg/dL (ref 70–99)
Glucose-Capillary: 91 mg/dL (ref 70–99)

## 2020-07-26 NOTE — Progress Notes (Signed)
D: Pt alert and oriented x 4. Pt rates depression 6/10 and anxiety 8/10. Pt reports experiencing 8/10 lower back pain. Pt denies experiencing any SI/HI, or AVH at this time. Pt has been calm and cooperative however voices frustration with the situation of still being on BMU and not on labor and delivery floor.  A: Scheduled medications administered to pt, per MD orders. Support and encouragement provided. Frequent verbal contact made. Routine safety checks conducted q15 minutes.   R: No adverse drug reactions noted. Pt verbally contracts for safety at this time. Pt complaint with medications and treatment plan. Pt interacts well with others on the unit. Pt remains safe at this time. Will continue to monitor.

## 2020-07-26 NOTE — Plan of Care (Signed)
  Problem: Education: Goal: Knowledge of [facility] Education information/materials will improve Outcome: Progressing Goal: Emotional status will improve Outcome: Progressing Goal: Mental status will improve Outcome: Progressing Goal: Verbalization of understanding the information provided will improve Outcome: Progressing   Problem: Activity: Goal: Interest or engagement in activities will improve Outcome: Progressing Goal: Sleeping patterns will improve Outcome: Progressing   

## 2020-07-26 NOTE — Progress Notes (Unsigned)
Pt to MFC @ [facility] for [place] and MFM consult @ 1345. She arrived via wheelchair by CNA escort from [employer].

## 2020-07-26 NOTE — Progress Notes (Signed)
Patient has been pleasant and cooperative but a little bit sad. Talking a lot about her mother. Reminiscing about how she used to order [employer] with her mother.Saying she keeps seeing her mother dead in bed. Denies SI, HI and AVH. Not voicing any complaint of pain.

## 2020-07-26 NOTE — Progress Notes (Signed)
Maternity 21 with SCA ordered by MFM after pt left the Clinic today.  Kit dropped off at Lab for the Phelbotomist to go to Behavioral Med Unit to draw blood on pt. Behavioral Med unit notified that lab would be coming to draw pts blood soon.

## 2020-07-26 NOTE — B[facility] Group Notes (Signed)
LCSW Group Therapy Note   07/26/2020 1:53 PM  Type of Therapy and Topic:  Group Therapy:  Overcoming Obstacles   Participation Level:  Active   Description of Group:    In this group patients will be encouraged to explore what they see as obstacles to their own wellness and recovery. They will be guided to discuss their thoughts, feelings, and behaviors related to these obstacles. The group will process together ways to cope with barriers, with attention given to specific choices patients can make. Each patient will be challenged to identify changes they are motivated to make in order to overcome their obstacles. This group will be process-oriented, with patients participating in exploration of their own experiences as well as giving and receiving support and challenge from other group members.   Therapeutic Goals: 1. Patient will identify personal and current obstacles as they relate to admission. 2. Patient will identify barriers that currently interfere with their wellness or overcoming obstacles.  3. Patient will identify feelings, thought process and behaviors related to these barriers. 4. Patient will identify two changes they are willing to make to overcome these obstacles:      Summary of Patient Progress Patient attended group.  Patient was an active participant.  Patient shared how she has been affected by emotions. Patient was able to provide examples and support her other group members.  Patient was on task and able to follow group discussions.     Therapeutic Modalities:   Cognitive Behavioral Therapy Solution Focused Therapy Motivational Interviewing Relapse Prevention Therapy  Penni Homans, MSW, LCSW 07/26/2020 1:53 PM

## 2020-07-26 NOTE — Progress Notes (Signed)
Pt [place] & MFM Consult completed at [facility] @ [facility] 1530.  Dr. Zannie Kehr [place] report to be faxed to [facility],  F/U appt scheduled for MFM 08/02/20 and Maternity 21 to be drawn at next appt whether inpatient still or outpatient. Pt transported back to [employer] via wheelchair by Harriett Sine.

## 2020-07-26 NOTE — Consult Note (Addendum)
Maternal-Fetal Medicine   Name: [name] DOB: 1986/06/16 MRN: [id number] Referring Provider: Vena Austria, MD  I had the pleasure of seeing [name] today at the Maternal Fetal Care, [facility]. She was admitted at the Behavioral Unit last week under the care of psychiatrists. Patient is a G6 [id number] at 33w 3d (EDD 09/10/20). She had ultrasound at Radiology Dept and fetal growth restriction was suspected. Patient has not had prenatal care in this pregnancy.  She was admitted at the [facility] about 3 weeks ago and was discharged a week later.  She had an abnormal 1-hour glucose challenge test (177 mg/dL).  She has not had a 3-hour glucose tolerance test. Patient reports she takes insulin for control of diabetes. She had COVID-19 infection 2 months ago and was not hospitalized.  Obstetric history significant for 5 previous cesarean deliveries most recent delivery was in April 2019 of a female infant weighing 7 pounds at birth.  She has 4 living children.  Her first child died at 1 year (because not known).  Past medical history: Chronic hypertension.  Patient reports she had intermittent hypertension but never took antihypertensives.  No history of diabetes.  She also reports she has hypothyroidism but her recent TSH level is within normal range.  Past surgical history: Cesarean deliveries, wisdom tooth removal.  Allergies: Aspirin (swelling). Medications: Tylenol as needed, Maalox, aripiprazole (Abilify), Flexeril, Benadryl, haloperidol, Zoloft 50 mg daily, trazodone, insulin. Social history: Patient reports she smokes 4 cigarettes daily and occasionally consumes alcohol.  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 Family history: No history of venous thromboembolism in the family.  Labs: Hemoglobin 10.1, hematocrit 30.5, WBC 10, platelets 182.  Electrolytes within normal limits except potassium 3.1, AST 13, ALT 12, SARS-CoV-2 negative.  Urine toxicology (see social  history).  Ultrasound We performed ultrasound today.  The estimated fetal weight is at the 3rd percentile.  Head circumference measurement is at between -2 to -1 SD.  Amniotic fluid is normal and good fetal activity seen.  Cephalic presentation.  Fetal anatomical survey appears normal, but limited by advanced gestational age. Antenatal testing is reassuring.  BPP 8/8.  Umbilical artery Doppler showed normal forward diastolic flow. Placenta is anterior and there is no evidence of previa or accreta.    I counseled the patient on the following: Fetal growth restriction Patient reports 3 of her children weighed around 5 pounds and her last child weighed 7 pounds at birth.   Discussed the possible causes of fetal growth restriction including placental insufficiency.  Patient is 34 years old and has not had screening for fetal aneuploidies.  I informed her that fetal growth restriction can be associated with chromosomal anomalies including Down syndrome.  I discussed the option of cell free fetal DNA screening or amniocentesis for a definitive result on the fetal karyotype. Patient prefers to have cell free fetal DNA screening. I counseled her on ultrasound findings that is consistent with fetal growth restriction.  I informed her that it is difficult to differentiate a constitutionally small fetus from fetal growth restriction.  Discussed weekly antenatal testing with Doppler studies to identify fetal compromise. I discussed timing of delivery.  If severe growth restriction persists, delivery would be recommended at [redacted] weeks gestation. Patient is aware that she will be undergoing repeat cesarean delivery.  Gestational diabetes: I reviewed the blood glucose measurements and they are within normal range.  I explained the importance of checking blood glucose regularly to prevent adverse fetal or neonatal outcomes.  Insulin should be continued, and the patient should receive diabetic education before  discharge to learn self-monitoring of blood glucose.  Medications Abilify (ariprazole): It is an antipsychotic medication It has a longer half-life as it is mostly protein bound. It is likely that this drug crosses the placenta. One prospective observational cohort study showed no increase in major congenital malformations in the fetus. Higher rate of fetal growth restriction was seen in women taking this medication. Use of this medication in the third trimester is likely to increase the risk of extrapyramidal or withdrawal symptoms in the newborn. It can decrease milk production.  Trazodone: It is an antidepressant that is chemically unrelated other major antidepressants. It is not associated with increased congenital malformations or other adverse outcomes in the newborn. It is excreted in small amounts in breast milk and appears compatible with breastfeeding.  Zoloft (sertraline): No increase in congenital malformations is expected with the use of this medication. It is an SSRI agent. A small absolute increase in primary pulmonary hypertension has been observed. However, untreated depression can be associated with adverse maternal outcomes that can override the above-mentioned risk. Patient was counseled on the small risk for primary pulmonary hypertension.  Sertraline is excreted into breast milk and long-term effects on the infant are not known. The American Academy of Pediatrics classifies this as a drug whose effect on the newborn is unknown, but may be of concern.  Flexeril and haloperidol can be given if maternal benefits outweigh fetal risks.  Cocaine use It is a natural alkaloid. Cocaine is associated with adverse pregnancy outcomes. Pregnancy complications include increase in spontaneous miscarriages, PPROM, preterm delivery, gestational hypertension and placental abruption. Fetal congenital malformations including cardiac anomalies are increased. Fetal growth restriction is associated with  maternal cocaine use. Increased perinatal mortality including stillbirth, and poor neurobehavioral outcomes in the newborn are also increased. Uteroplacental circulation is compromised (ischemia). Sudden infant death syndrome in the infants are more common. I strongly recommended that she abstain from using cocaine in pregnancy.   Although ultrasound showed no obvious fetal anomalies 8 (ultrasound) has limitations in detecting fetal anomalies and some of the anomalies may not be evident only postnatally.  Recommendations: -Continue weekly BPP and UA Doppler studies till delivery (appointment was made). -Fetal growth assessment in 3 weeks. -Cell free fetal DNA screening ([id number]). -Behavioral management as per psychiatrist. -Repeat cesarean delivery at [redacted] weeks gestation if severe fetal growth restriction persists. -Substance use counseling. -Diabetic educator consultation. -NICU consultation after discussing with her obstetrician (place of delivery)  Thank you for consultation.  Please contact me if you have any questions.  Consultation including face-to-face counseling 50 minutes.  Discussed with Margaretmary Eddy, CNM [facility]).

## 2020-07-26 NOTE — Progress Notes (Signed)
[facility] MD Progress Note  07/26/2020 5:44 PM [name]  MRN:  [id number] Subjective: Follow-up for this patient with longstanding depression and substance abuse.  Patient tells me she is still feeling not so good.  Denied suicidal intent or plan.  Denied psychotic symptoms.  Says that she feels tired and hopeless and was just overwhelmed with sadness being back by her self at her apartment.  She admits that she had been drinking but cannot remember having used cocaine.  Patient has been calm and compliant here on the psychiatric ward. Principal Problem: MDD (major depressive disorder), recurrent severe, without psychosis (HCC) Diagnosis: Principal Problem:   MDD (major depressive disorder), recurrent severe, without psychosis (HCC) Active Problems:   Abdominal pain in pregnancy, third trimester   Supervision of high risk pregnancy, antepartum   Substance abuse complicating pregnancy, antepartum   Bipolar disease in pregnancy, unspecified trimester (HCC)   Genital herpes affecting pregnancy   Abnormal glucose tolerance test in pregnancy   High risk pregnancy due to history of preterm labor, antepartum   Chronic hypertension during pregnancy, antepartum   History of prior pregnancy with IUGR newborn   Asthma affecting pregnancy, antepartum   History of fetal anomaly in prior pregnancy, currently pregnant   Diet controlled gestational diabetes mellitus (GDM) in third trimester   Intrauterine growth restriction, antepartum, third trimester, not applicable or unspecified fetus  Total Time spent with patient: 30 minutes  Past Psychiatric History: Past history of multiple hospitalizations for mood symptoms usually in the context of substance abuse  Past Medical History:  Past Medical History:  Diagnosis Date  . Anxiety   . Depression   . Gestational diabetes   . History of substance abuse (HCC)   . History of suicide attempt   . History of thyroid disease   . Hypertension   . Thyroid  disease     Past Surgical History:  Procedure Laterality Date  . CESAREAN SECTION  2000, 2002, 2013  . WISDOM TOOTH EXTRACTION     Family History:  Family History  Problem Relation Age of Onset  . Diabetes Mother    Family Psychiatric  History: See previous Social History:  Social History   Substance and Sexual Activity  Alcohol Use Not Currently   Comment: social drinker     Social History   Substance and Sexual Activity  Drug Use Not Currently  . Types: Cocaine, Marijuana   Comment: Positive UDS 12/2017    Social History   Socioeconomic History  . Marital status: Single    Spouse name: Not on file  . Number of children: Not on file  . Years of education: Not on file  . Highest education level: Not on file  Occupational History  . Not on file  Tobacco Use  . Smoking status: Former Smoker    Packs/day: 0.50  . Smokeless tobacco: Never Used  Substance and Sexual Activity  . Alcohol use: Not Currently    Comment: social drinker  . Drug use: Not Currently    Types: Cocaine, Marijuana    Comment: Positive UDS 12/2017  . Sexual activity: Not Currently    Birth control/protection: None  Other Topics Concern  . Not on file  Social History Narrative  . Not on file   Social Determinants of Health   Financial Resource Strain:   . Difficulty of Paying Living Expenses:   Food Insecurity:   . Worried About Programme researcher, broadcasting/film/video in the Last Year:   . Ran  Out of Food in the Last Year:   Transportation Needs:   . Lack of Transportation (Medical):   Marland Kitchen Lack of Transportation (Non-Medical):   Physical Activity:   . Days of Exercise per Week:   . Minutes of Exercise per Session:   Stress:   . Feeling of Stress :   Social Connections:   . Frequency of Communication with Friends and Family:   . Frequency of Social Gatherings with Friends and Family:   . Attends Religious Services:   . Active Member of Clubs or Organizations:   . Attends Banker Meetings:    Marland Kitchen Marital Status:    Additional Social History:                         Sleep: Fair  Appetite:  Fair  Current Medications: Current Facility-Administered Medications  Medication Dose Route Frequency Provider Last Rate Last Admin  . acetaminophen (TYLENOL) tablet 650 mg  650 mg Oral Q6H PRN Jearld Lesch, NP   650 mg at 07/26/20 1621  . alum & mag hydroxide-simeth (MAALOX/MYLANTA) 200-200-20 MG/5ML suspension 30 mL  30 mL Oral Q4H PRN Dixon, Rashaun M, NP   30 mL at 07/24/20 1108  . ARIPiprazole (ABILIFY) tablet 5 mg  5 mg Oral QHS Roselind Messier, MD   5 mg at 07/25/20 2113  . ARIPiprazole ER (ABILIFY MAINTENA) injection 400 mg  400 mg Intramuscular Once Roselind Messier, MD      . calcium carbonate (TUMS - dosed in mg elemental calcium) chewable tablet 200 mg of elemental calcium  1 tablet Oral BID Antonieta Pert, MD   200 mg of elemental calcium at 07/26/20 1621  . cyclobenzaprine (FLEXERIL) tablet 5 mg  5 mg Oral TID PRN Roselind Messier, MD      . diphenhydrAMINE (BENADRYL) capsule 50 mg  50 mg Oral Q6H PRN Jearld Lesch, NP   50 mg at 07/25/20 2115  . haloperidol (HALDOL) tablet 5 mg  5 mg Oral Once PRN Antonieta Pert, MD       Or  . haloperidol lactate (HALDOL) injection 5 mg  5 mg Intramuscular Once PRN Antonieta Pert, MD      . magnesium hydroxide (MILK OF MAGNESIA) suspension 30 mL  30 mL Oral Daily PRN Jearld Lesch, NP      . prenatal multivitamin tablet 1 tablet  1 tablet Oral Q1200 Jearld Lesch, NP   1 tablet at 07/26/20 1135  . sertraline (ZOLOFT) tablet 50 mg  50 mg Oral Daily Dixon, Rashaun M, NP   50 mg at 07/26/20 0758  . traZODone (DESYREL) tablet 50 mg  50 mg Oral QHS Roselind Messier, MD   50 mg at 07/25/20 2113    Lab Results:  Results for orders placed or performed during the hospital encounter of 07/18/20 (from the past 48 hour(s))  Glucose, capillary     Status: None   Collection Time: 07/24/20  8:39 PM  Result Value Ref  Range   Glucose-Capillary 81 70 - 99 mg/dL    Comment: Glucose reference range applies only to samples taken after fasting for at least 8 hours.  Glucose, capillary     Status: None   Collection Time: 07/25/20  7:09 AM  Result Value Ref Range   Glucose-Capillary 72 70 - 99 mg/dL    Comment: Glucose reference range applies only to samples taken after fasting for at least 8 hours.  Comment 1 Notify RN   Glucose, capillary     Status: None   Collection Time: 07/25/20  4:15 PM  Result Value Ref Range   Glucose-Capillary 83 70 - 99 mg/dL    Comment: Glucose reference range applies only to samples taken after fasting for at least 8 hours.  Glucose, capillary     Status: None   Collection Time: 07/25/20  9:21 PM  Result Value Ref Range   Glucose-Capillary 86 70 - 99 mg/dL    Comment: Glucose reference range applies only to samples taken after fasting for at least 8 hours.  Glucose, capillary     Status: None   Collection Time: 07/26/20  7:41 AM  Result Value Ref Range   Glucose-Capillary 90 70 - 99 mg/dL    Comment: Glucose reference range applies only to samples taken after fasting for at least 8 hours.  Glucose, capillary     Status: None   Collection Time: 07/26/20 11:41 AM  Result Value Ref Range   Glucose-Capillary 91 70 - 99 mg/dL    Comment: Glucose reference range applies only to samples taken after fasting for at least 8 hours.    Blood Alcohol level:  Lab Results  Component Value Date   ETH 131 (H) 07/15/2020   ETH 89 (H) 06/02/2020    Metabolic Disorder Labs: Lab Results  Component Value Date   HGBA1C 5.4 07/21/2020   MPG 108 07/21/2020   MPG 111.15 06/05/2020   No results found for: PROLACTIN Lab Results  Component Value Date   CHOL 218 (H) 01/02/2018   TRIG 187 (H) 01/02/2018   HDL 81 01/02/2018   CHOLHDL 2.7 01/02/2018   VLDL 37 01/02/2018   LDLCALC 100 (H) 01/02/2018   LDLCALC 82 01/12/2016    Physical Findings: AIMS:  , ,  ,  ,    CIWA:    COWS:      Musculoskeletal: Strength & Muscle Tone: within normal limits Gait & Station: normal Patient leans: Backward and N/A  Psychiatric Specialty Exam: Physical Exam Vitals and nursing note reviewed.  Constitutional:      Appearance: She is well-developed.  HENT:     Head: Normocephalic and atraumatic.  Eyes:     Conjunctiva/sclera: Conjunctivae normal.     Pupils: Pupils are equal, round, and reactive to light.  Cardiovascular:     Heart sounds: Normal heart sounds.  Pulmonary:     Effort: Pulmonary effort is normal.  Abdominal:     Palpations: Abdomen is soft. There is mass.  Musculoskeletal:        General: Normal range of motion.     Cervical back: Normal range of motion.  Skin:    General: Skin is warm and dry.  Neurological:     General: No focal deficit present.     Mental Status: She is alert.  Psychiatric:        Mood and Affect: Mood normal.     Review of Systems  Constitutional: Negative.   HENT: Negative.   Eyes: Negative.   Respiratory: Negative.   Cardiovascular: Negative.   Gastrointestinal: Negative.   Musculoskeletal: Negative.   Skin: Negative.   Neurological: Negative.   Psychiatric/Behavioral: Negative.     Blood pressure (!) 110/59, pulse 64, temperature 98.3 F (36.8 C), temperature source Oral, resp. rate 18, height 4\' 11"  (1.499 m), weight 76 kg, last menstrual period 12/04/2019, SpO2 100 %.Body mass index is 33.84 kg/m.  General Appearance: Casual  Eye Contact:  Good  Speech:  Clear and Coherent  Volume:  Normal  Mood:  Euthymic  Affect:  Constricted  Thought Process:  Coherent  Orientation:  Full (Time, Place, and Person)  Thought Content:  Logical  Suicidal Thoughts:  No  Homicidal Thoughts:  No  Memory:  Immediate;   Fair Recent;   Fair Remote;   Fair  Judgement:  Fair  Insight:  Fair  Psychomotor Activity:  Normal  Concentration:  Concentration: Fair  Recall:  Fis[employer]Fair  Fund of Knowledge:  Fair  Language:  Fair  Akathisia:   No  Handed:  Right  AIMS (if indicated):     Assets:  Desire for Improvement  ADL's:  Impaired  Cognition:  WNL  Sleep:  Number of Hours: 6.5     Treatment Plan Summary: Plan Patient had a visit with OB/GYN today.  Pregnancy appears to be healthy and stable.  They will continue to follow-up with her.  She is only about 33-1/2 weeks and is not likely to be ready to deliver for several weeks.  Continue current medications and support on the unit.  Looking for some kind of appropriate discharge plan.  Mordecai RasmussenJohn Martel Galvan, MD 07/26/2020, 5:44 PM

## 2020-07-27 LAB — GLUCOSE, CAPILLARY
Glucose-Capillary: 90 mg/dL (ref 70–99)
Glucose-Capillary: 85 mg/dL (ref 70–99)
Glucose-Capillary: 73 mg/dL (ref 70–99)

## 2020-07-27 NOTE — [facility] Group Notes (Signed)
Emotional Regulation 07/27/2020 1PM  Type of Therapy/Topic:  Group Therapy:  Emotion Regulation  Participation Level:  Minimal   Description of Group:   The purpose of this group is to assist patients in learning to regulate negative emotions and experience positive emotions. Patients will be guided to discuss ways in which they have been vulnerable to their negative emotions. These vulnerabilities will be juxtaposed with experiences of positive emotions or situations, and patients will be challenged to use positive emotions to combat negative ones. Special emphasis will be placed on coping with negative emotions in conflict situations, and patients will process healthy conflict resolution skills.  Therapeutic Goals: 1. Patient will identify two positive emotions or experiences to reflect on in order to balance out negative emotions 2. Patient will label two or more emotions that they find the most difficult to experience 3. Patient will demonstrate positive conflict resolution skills through discussion and/or role plays  Summary of Patient Progress: Minimal participation during group session. Pt did not complete group activity "dont sweat the small stuff" but did interact appropriately with group members. Pt respected boundaries during session.      Therapeutic Modalities:   Cognitive Behavioral Therapy Feelings Identification Dialectical Behavioral Therapy   Suzan Slick, LCSW 07/27/2020 3:01 PM

## 2020-07-27 NOTE — BHH Counselor (Signed)
CSW provided the patient with the number to her [facility] again.  CSW again reminded her that the Care Coordinator would like to speak with her.   Penni Homans, MSW, LCSW 07/27/2020 2:58 PM

## 2020-07-27 NOTE — Progress Notes (Signed)
Recreation Therapy Notes  Date: 07/27/2020  Time: 9:30 am   Location: Craft room     Behavioral response: N/A   Intervention Topic: Stress Management    Discussion/Intervention: Patient did not attend group.   Clinical Observations/Feedback:  Patient did not attend group.   Luan Urbani LRT/CTRS        Joyous Gleghorn 07/27/2020 11:47 AM

## 2020-07-27 NOTE — Tx Team (Signed)
Interdisciplinary Treatment and Diagnostic Plan Update  07/27/2020 Time of Session: 830AM [name] MRN: [id number]  Principal Diagnosis: MDD (major depressive disorder), recurrent severe, without psychosis (HCC)  Secondary Diagnoses: Principal Problem:   MDD (major depressive disorder), recurrent severe, without psychosis (HCC) Active Problems:   Abdominal pain in pregnancy, third trimester   Supervision of high risk pregnancy, antepartum   Substance abuse complicating pregnancy, antepartum   Bipolar disease in pregnancy, unspecified trimester (HCC)   Genital herpes affecting pregnancy   Abnormal glucose tolerance test in pregnancy   High risk pregnancy due to history of preterm labor, antepartum   Chronic hypertension during pregnancy, antepartum   History of prior pregnancy with IUGR newborn   Asthma affecting pregnancy, antepartum   History of fetal anomaly in prior pregnancy, currently pregnant   Diet controlled gestational diabetes mellitus (GDM) in third trimester   Intrauterine growth restriction, antepartum, third trimester, not applicable or unspecified fetus   Current Medications:  Current Facility-Administered Medications  Medication Dose Route Frequency Provider Last Rate Last Admin  . acetaminophen (TYLENOL) tablet 650 mg  650 mg Oral Q6H PRN Jearld Lesch, NP   650 mg at 07/27/20 0747  . alum & mag hydroxide-simeth (MAALOX/MYLANTA) 200-200-20 MG/5ML suspension 30 mL  30 mL Oral Q4H PRN Dixon, Rashaun M, NP   30 mL at 07/24/20 1108  . ARIPiprazole (ABILIFY) tablet 5 mg  5 mg Oral QHS Roselind Messier, MD   5 mg at 07/26/20 2111  . ARIPiprazole ER (ABILIFY MAINTENA) injection 400 mg  400 mg Intramuscular Once Roselind Messier, MD      . calcium carbonate (TUMS - dosed in mg elemental calcium) chewable tablet 200 mg of elemental calcium  1 tablet Oral BID Antonieta Pert, MD   200 mg of elemental calcium at 07/27/20 1300  . cyclobenzaprine (FLEXERIL) tablet  5 mg  5 mg Oral TID PRN Roselind Messier, MD      . diphenhydrAMINE (BENADRYL) capsule 50 mg  50 mg Oral Q6H PRN Jearld Lesch, NP   50 mg at 07/26/20 2111  . haloperidol (HALDOL) tablet 5 mg  5 mg Oral Once PRN Antonieta Pert, MD       Or  . haloperidol lactate (HALDOL) injection 5 mg  5 mg Intramuscular Once PRN Antonieta Pert, MD      . magnesium hydroxide (MILK OF MAGNESIA) suspension 30 mL  30 mL Oral Daily PRN Jearld Lesch, NP      . prenatal multivitamin tablet 1 tablet  1 tablet Oral Q1200 Jearld Lesch, NP   1 tablet at 07/27/20 1115  . sertraline (ZOLOFT) tablet 50 mg  50 mg Oral Daily Dixon, Rashaun M, NP   50 mg at 07/27/20 0747  . traZODone (DESYREL) tablet 50 mg  50 mg Oral QHS Roselind Messier, MD   50 mg at 07/25/20 2113   PTA Medications: Medications Prior to Admission  Medication Sig Dispense Refill Last Dose  . Prenatal Vit-Fe Fumarate-FA (PRENATAL MULTIVITAMIN) TABS tablet Take 1 tablet by mouth daily at 12 noon.     . sertraline (ZOLOFT) 50 MG tablet Take 50 mg by mouth daily.       Patient Stressors:    Patient Strengths:    Treatment Modalities: Medication Management, Group therapy, Case management,  1 to 1 session with clinician, Psychoeducation, Recreational therapy.   Physician Treatment Plan for Primary Diagnosis: MDD (major depressive disorder), recurrent severe, without psychosis (HCC) Long Term Goal(s):  Short Term Goals:    Medication Management: Evaluate patient's response, side effects, and tolerance of medication regimen.  Therapeutic Interventions: 1 to 1 sessions, Unit Group sessions and Medication administration.  Evaluation of Outcomes: Progressing  Physician Treatment Plan for Secondary Diagnosis: Principal Problem:   MDD (major depressive disorder), recurrent severe, without psychosis (HCC) Active Problems:   Abdominal pain in pregnancy, third trimester   Supervision of high risk pregnancy, antepartum   Substance  abuse complicating pregnancy, antepartum   Bipolar disease in pregnancy, unspecified trimester (HCC)   Genital herpes affecting pregnancy   Abnormal glucose tolerance test in pregnancy   High risk pregnancy due to history of preterm labor, antepartum   Chronic hypertension during pregnancy, antepartum   History of prior pregnancy with IUGR newborn   Asthma affecting pregnancy, antepartum   History of fetal anomaly in prior pregnancy, currently pregnant   Diet controlled gestational diabetes mellitus (GDM) in third trimester   Intrauterine growth restriction, antepartum, third trimester, not applicable or unspecified fetus  Long Term Goal(s):     Short Term Goals:       Medication Management: Evaluate patient's response, side effects, and tolerance of medication regimen.  Therapeutic Interventions: 1 to 1 sessions, Unit Group sessions and Medication administration.  Evaluation of Outcomes: Progressing   RN Treatment Plan for Primary Diagnosis: MDD (major depressive disorder), recurrent severe, without psychosis (HCC) Long Term Goal(s): Knowledge of disease and therapeutic regimen to maintain health will improve  Short Term Goals: Ability to remain free from injury will improve, Ability to verbalize frustration and anger appropriately will improve, Ability to demonstrate self-control, Ability to participate in decision making will improve, Ability to verbalize feelings will improve, Ability to disclose and discuss suicidal ideas, Ability to identify and develop effective coping behaviors will improve and Compliance with prescribed medications will improve  Medication Management: RN will administer medications as ordered by provider, will assess and evaluate patient's response and provide education to patient for prescribed medication. RN will report any adverse and/or side effects to prescribing provider.  Therapeutic Interventions: 1 on 1 counseling sessions, Psychoeducation, Medication  administration, Evaluate responses to treatment, Monitor vital signs and CBGs as ordered, Perform/monitor CIWA, COWS, AIMS and Fall Risk screenings as ordered, Perform wound care treatments as ordered.  Evaluation of Outcomes: Progressing   LCSW Treatment Plan for Primary Diagnosis: MDD (major depressive disorder), recurrent severe, without psychosis (HCC) Long Term Goal(s): Safe transition to appropriate next level of care at discharge, Engage patient in therapeutic group addressing interpersonal concerns.  Short Term Goals: Engage patient in aftercare planning with referrals and resources, Increase social support, Increase ability to appropriately verbalize feelings, Increase emotional regulation, Facilitate acceptance of mental health diagnosis and concerns, Facilitate patient progression through stages of change regarding substance use diagnoses and concerns, Identify triggers associated with mental health/substance abuse issues and Increase skills for wellness and recovery  Therapeutic Interventions: Assess for all discharge needs, 1 to 1 time with Social worker, Explore available resources and support systems, Assess for adequacy in community support network, Educate family and significant other(s) on suicide prevention, Complete Psychosocial Assessment, Interpersonal group therapy.  Evaluation of Outcomes: Progressing   Progress in Treatment: Attending groups: Yes. Participating in groups: Yes. Taking medication as prescribed: Yes. Toleration medication: Yes. Family/Significant other contact made: Yes, individual(s) contacted:  Sister was contacted  Patient understands diagnosis: Yes. Discussing patient identified problems/goals with staff: No. Medical problems stabilized or resolved: Yes. Denies suicidal/homicidal ideation: Yes. Issues/concerns per patient self-inventory: No. Other:  NA   New problem(s) identified: No, Describe:  None  New Short Term/Long Term Goal(s): medication  stabilization, elimination of SI thoughts, development of comprehensive mental wellness plan    Update 07/27/2020: No changes at this time.   Patient Goals:  Patient stated that she has no goals. Patient also spoke about her children being in DSS custody but did not know the status of the case. Update 07/27/2020:  Patient reports that she would like to find placement, though admits she has no income at this time.    Discharge Plan or Barriers: Discharge plan is pending. Housing could be a barrier for patient when discharged. Patient also is not sure if she wants to care for her unborn child and if DSS will take her baby into custody. Update 07/22/20-Pt declines referral for substance use inpatient residential treatment. Pt states she will resume CST with [facility].  Update 07/27/2020:  Patient continues to decline SA referrals.  Patient reports that she will return to her home and continue with CST.  Reason for Continuation of Hospitalization: Depression Suicidal ideation  Estimated Length of Stay: 5-7 Days   Attendees: Patient: 07/27/2020 3:00 PM  Physician: Dr. Toni Amend 07/27/2020 3:00 PM  Nursing: Jorene Minors, RN 07/27/2020 3:00 PM  RN Care Manager: 07/27/2020 3:00 PM  Social Worker: Penni Homans, LCSW 07/27/2020 3:00 PM  Recreational Therapist:  07/27/2020 3:00 PM  Other:  07/27/2020 3:00 PM  Other:  07/27/2020 3:00 PM  Other: 07/27/2020 3:00 PM    Scribe for Treatment Team: [name], LCSW 07/27/2020 3:00 PM

## 2020-07-27 NOTE — Progress Notes (Signed)
[facility] MD Progress Note  07/27/2020 8:09 PM [name]  MRN:  [id number] Subjective: Patient seen and chart reviewed.  Patient is complaining that she wishes she could be transferred somewhere else.  No acute suicidal ideation no acute psychosis.  No new physical complaints. Principal Problem: MDD (major depressive disorder), recurrent severe, without psychosis (HCC) Diagnosis: Principal Problem:   MDD (major depressive disorder), recurrent severe, without psychosis (HCC) Active Problems:   Abdominal pain in pregnancy, third trimester   Supervision of high risk pregnancy, antepartum   Substance abuse complicating pregnancy, antepartum   Bipolar disease in pregnancy, unspecified trimester (HCC)   Genital herpes affecting pregnancy   Abnormal glucose tolerance test in pregnancy   High risk pregnancy due to history of preterm labor, antepartum   Chronic hypertension during pregnancy, antepartum   History of prior pregnancy with IUGR newborn   Asthma affecting pregnancy, antepartum   History of fetal anomaly in prior pregnancy, currently pregnant   Diet controlled gestational diabetes mellitus (GDM) in third trimester   Intrauterine growth restriction, antepartum, third trimester, not applicable or unspecified fetus  Total Time spent with patient: 30 minutes  Past Psychiatric History: Past history of substance abuse and depression  Past Medical History:  Past Medical History:  Diagnosis Date  . Anxiety   . Depression   . Gestational diabetes   . History of substance abuse (HCC)   . History of suicide attempt   . History of thyroid disease   . Hypertension   . Thyroid disease     Past Surgical History:  Procedure Laterality Date  . CESAREAN SECTION  2000, 2002, 2013  . WISDOM TOOTH EXTRACTION     Family History:  Family History  Problem Relation Age of Onset  . Diabetes Mother    Family Psychiatric  History: See previous Social History:  Social History   Substance  and Sexual Activity  Alcohol Use Not Currently   Comment: social drinker     Social History   Substance and Sexual Activity  Drug Use Not Currently  . Types: Cocaine, Marijuana   Comment: Positive UDS 12/2017    Social History   Socioeconomic History  . Marital status: Single    Spouse name: Not on file  . Number of children: Not on file  . Years of education: Not on file  . Highest education level: Not on file  Occupational History  . Not on file  Tobacco Use  . Smoking status: Former Smoker    Packs/day: 0.50  . Smokeless tobacco: Never Used  Substance and Sexual Activity  . Alcohol use: Not Currently    Comment: social drinker  . Drug use: Not Currently    Types: Cocaine, Marijuana    Comment: Positive UDS 12/2017  . Sexual activity: Not Currently    Birth control/protection: None  Other Topics Concern  . Not on file  Social History Narrative  . Not on file   Social Determinants of Health   Financial Resource Strain:   . Difficulty of Paying Living Expenses:   Food Insecurity:   . Worried About Programme researcher, broadcasting/film/video in the Last Year:   . Barista in the Last Year:   Transportation Needs:   . Freight forwarder (Medical):   Marland Kitchen Lack of Transportation (Non-Medical):   Physical Activity:   . Days of Exercise per Week:   . Minutes of Exercise per Session:   Stress:   . Feeling of Stress :  Social Connections:   . Frequency of Communication with Friends and Family:   . Frequency of Social Gatherings with Friends and Family:   . Attends Religious Services:   . Active Member of Clubs or Organizations:   . Attends Banker Meetings:   Marland Kitchen Marital Status:    Additional Social History:                         Sleep: Fair  Appetite:  Fair  Current Medications: Current Facility-Administered Medications  Medication Dose Route Frequency Provider Last Rate Last Admin  . acetaminophen (TYLENOL) tablet 650 mg  650 mg Oral Q6H PRN  Jearld Lesch, NP   650 mg at 07/27/20 1655  . alum & mag hydroxide-simeth (MAALOX/MYLANTA) 200-200-20 MG/5ML suspension 30 mL  30 mL Oral Q4H PRN Dixon, Rashaun M, NP   30 mL at 07/24/20 1108  . ARIPiprazole (ABILIFY) tablet 5 mg  5 mg Oral QHS Roselind Messier, MD   5 mg at 07/26/20 2111  . ARIPiprazole ER (ABILIFY MAINTENA) injection 400 mg  400 mg Intramuscular Once Roselind Messier, MD      . calcium carbonate (TUMS - dosed in mg elemental calcium) chewable tablet 200 mg of elemental calcium  1 tablet Oral BID Antonieta Pert, MD   200 mg of elemental calcium at 07/27/20 1645  . cyclobenzaprine (FLEXERIL) tablet 5 mg  5 mg Oral TID PRN Roselind Messier, MD      . diphenhydrAMINE (BENADRYL) capsule 50 mg  50 mg Oral Q6H PRN Jearld Lesch, NP   50 mg at 07/26/20 2111  . haloperidol (HALDOL) tablet 5 mg  5 mg Oral Once PRN Antonieta Pert, MD       Or  . haloperidol lactate (HALDOL) injection 5 mg  5 mg Intramuscular Once PRN Antonieta Pert, MD      . magnesium hydroxide (MILK OF MAGNESIA) suspension 30 mL  30 mL Oral Daily PRN Jearld Lesch, NP      . prenatal multivitamin tablet 1 tablet  1 tablet Oral Q1200 Jearld Lesch, NP   1 tablet at 07/27/20 1115  . sertraline (ZOLOFT) tablet 50 mg  50 mg Oral Daily Dixon, Rashaun M, NP   50 mg at 07/27/20 0747  . traZODone (DESYREL) tablet 50 mg  50 mg Oral QHS Roselind Messier, MD   50 mg at 07/25/20 2113    Lab Results:  Results for orders placed or performed during the hospital encounter of 07/18/20 (from the past 48 hour(s))  Glucose, capillary     Status: None   Collection Time: 07/25/20  9:21 PM  Result Value Ref Range   Glucose-Capillary 86 70 - 99 mg/dL    Comment: Glucose reference range applies only to samples taken after fasting for at least 8 hours.  Glucose, capillary     Status: None   Collection Time: 07/26/20  7:41 AM  Result Value Ref Range   Glucose-Capillary 90 70 - 99 mg/dL    Comment: Glucose reference  range applies only to samples taken after fasting for at least 8 hours.  Glucose, capillary     Status: None   Collection Time: 07/26/20 11:41 AM  Result Value Ref Range   Glucose-Capillary 91 70 - 99 mg/dL    Comment: Glucose reference range applies only to samples taken after fasting for at least 8 hours.    Blood Alcohol level:  Lab Results  Component  Value Date   ETH 131 (H) 07/15/2020   ETH 89 (H) 06/02/2020    Metabolic Disorder Labs: Lab Results  Component Value Date   HGBA1C 5.4 07/21/2020   MPG 108 07/21/2020   MPG 111.15 06/05/2020   No results found for: PROLACTIN Lab Results  Component Value Date   CHOL 218 (H) 01/02/2018   TRIG 187 (H) 01/02/2018   HDL 81 01/02/2018   CHOLHDL 2.7 01/02/2018   VLDL 37 01/02/2018   LDLCALC 100 (H) 01/02/2018   LDLCALC 82 01/12/2016    Physical Findings: AIMS:  , ,  ,  ,    CIWA:    COWS:     Musculoskeletal: Strength & Muscle Tone: within normal limits Gait & Station: normal Patient leans: N/A  Psychiatric Specialty Exam: Physical Exam Vitals and nursing note reviewed.  Constitutional:      Appearance: She is well-developed.  HENT:     Head: Normocephalic and atraumatic.  Eyes:     Conjunctiva/sclera: Conjunctivae normal.     Pupils: Pupils are equal, round, and reactive to light.  Cardiovascular:     Heart sounds: Normal heart sounds.  Pulmonary:     Effort: Pulmonary effort is normal.  Abdominal:     Palpations: Abdomen is soft.  Musculoskeletal:        General: Normal range of motion.     Cervical back: Normal range of motion.  Skin:    General: Skin is warm and dry.  Neurological:     General: No focal deficit present.     Mental Status: She is alert.  Psychiatric:        Mood and Affect: Affect is blunt.     Review of Systems  Constitutional: Negative.   HENT: Negative.   Eyes: Negative.   Respiratory: Negative.   Cardiovascular: Negative.   Gastrointestinal: Negative.   Musculoskeletal:  Negative.   Skin: Negative.   Neurological: Negative.   Psychiatric/Behavioral: Positive for dysphoric mood.    Blood pressure 123/71, pulse 76, temperature 98.3 F (36.8 C), temperature source Oral, resp. rate 18, height 4\' 11"  (1.499 m), weight 76 kg, last menstrual period 12/04/2019, SpO2 100 %.Body mass index is 33.84 kg/m.  General Appearance: Casual  Eye Contact:  Minimal  Speech:  Slow  Volume:  Decreased  Mood:  Depressed  Affect:  Constricted  Thought Process:  Goal Directed  Orientation:  Full (Time, Place, and Person)  Thought Content:  Rumination  Suicidal Thoughts:  No  Homicidal Thoughts:  No  Memory:  Immediate;   Fair Recent;   Poor Remote;   Poor  Judgement:  Impaired  Insight:  Shallow  Psychomotor Activity:  Decreased  Concentration:  Concentration: Poor  Recall:  14/09/2019 of Knowledge:  Fair  Language:  Fair  Akathisia:  No  Handed:  Right  AIMS (if indicated):     Assets:  Resilience  ADL's:  Impaired  Cognition:  Impaired,  Mild  Sleep:  Number of Hours: 6.75     Treatment Plan Summary: Plan Patient is homeless with little support nowhere to go.  Weeks away from being ready to deliver.  [facility] reportedly suggested that the patient be transferred somewhere else but it is unclear to me where we would be transferring her to.  We will continue to work together as a treatment team to see if there are any options.  If not she may be ready for discharge relatively soon as she is not acutely dangerous  Mordecai RasmussenJohn Ahna Konkle, MD 07/27/2020, 8:09 PM

## 2020-07-27 NOTE — Progress Notes (Signed)
D: Pt alert and oriented x 4. Pt rates depression 9/10 and anxiety 0/10. Pt reports being depressed in r/t being here and anxious about child birth. Pt reports experiencing 8/10 lower back pain, prn meds given. Pt denies experiencing any SI/HI, or AVH at this time. Pt has been calm and cooperative.  A: Scheduled medications administered to pt, per MD orders. Support and encouragement provided. Frequent verbal contact made. Routine safety checks conducted q15 minutes.   R: No adverse drug reactions noted. Pt verbally contracts for safety at this time. Pt complaint with medications and treatment plan. Pt interacts well with others on the unit. Pt remains safe at this time. Will continue to monitor.

## 2020-07-27 NOTE — Plan of Care (Signed)
  Problem: Group Participation Goal: STG - Patient will engage in groups without prompting or encouragement from LRT x3 group sessions within 5 recreation therapy group sessions Description: STG - Patient will engage in groups without prompting or encouragement from LRT x3 group sessions within 5 recreation therapy group sessions Outcome: Not Progressing   

## 2020-07-27 NOTE — Progress Notes (Signed)
Patient alert and oriented x 4, affect is blunted thoughts are organized and coherent. Patient's denies SI/HI/AVH interacting appropriately with peers and staffshe c/o back pain and was medicated with tylenol PRN as neededshe was receptive to staff,15 minutes safety checks maintained will continue to monitor

## 2020-07-28 LAB — GLUCOSE, CAPILLARY
Glucose-Capillary: 102 mg/dL — ABNORMAL HIGH (ref 70–99)
Glucose-Capillary: 92 mg/dL (ref 70–99)
Glucose-Capillary: 93 mg/dL (ref 70–99)
Glucose-Capillary: 76 mg/dL (ref 70–99)
Glucose-Capillary: 112 mg/dL — ABNORMAL HIGH (ref 70–99)
Glucose-Capillary: 92 mg/dL (ref 70–99)

## 2020-07-28 NOTE — Progress Notes (Signed)
Patient approached nurses station asking for  Benadryl to help facilitate sleep. Medication received and tolerated without incident. Patient reports not being able to sleep fully through the night but continued to decline Trazadone.    Cleo Butler-Nicholson, LPN

## 2020-07-28 NOTE — Progress Notes (Signed)
Recreation Therapy Notes   Date: 07/28/2020  Time: 9:30 am   Location: Craft room     Behavioral response: N/A   Intervention Topic: Strengths   Discussion/Intervention: Patient did not attend group.   Clinical Observations/Feedback:  Patient did not attend group.   Dhilan Brauer LRT/CTRS        Klye Besecker 07/28/2020 11:53 AM

## 2020-07-28 NOTE — Progress Notes (Signed)
Patient alert and oriented x 4, affect is flat but she brightens upon  approach thoughts are organized and coherent,'she denies SI/HI/AVH interacting appropriately with peers and staffshe c/o back/ generalized pain and was medicated with tylenol PRN as neededshe was receptive to staff,no disruptive behavior 15 minutes safety checks maintained will continue to monitor.

## 2020-07-28 NOTE — Plan of Care (Signed)
Patient calm and pleasant on the unit  Problem: Education: Goal: Emotional status will improve Outcome: Progressing Goal: Mental status will improve Outcome: Progressing   

## 2020-07-28 NOTE — Plan of Care (Signed)
Pt rates depression 10/10. Pt denies anxiety, SI, HI and AVH. Pt was educated on care plan and verbalizes understanding. Torrie Mayers RN Problem: Education: Goal: Knowledge of [facility] Education information/materials will improve Outcome: Progressing Goal: Emotional status will improve Outcome: Not Progressing Goal: Mental status will improve Outcome: Not Progressing Goal: Verbalization of understanding the information provided will improve Outcome: Progressing   Problem: Activity: Goal: Interest or engagement in activities will improve Outcome: Not Progressing Goal: Sleeping patterns will improve Outcome: Not Progressing   Problem: Coping: Goal: Ability to verbalize frustrations and anger appropriately will improve Outcome: Progressing Goal: Ability to demonstrate self-control will improve Outcome: Progressing   Problem: Health Behavior/Discharge Planning: Goal: Identification of resources available to assist in meeting health care needs will improve Outcome: Progressing Goal: Compliance with treatment plan for underlying cause of condition will improve Outcome: Progressing   Problem: Physical Regulation: Goal: Ability to maintain clinical measurements within normal limits will improve Outcome: Progressing   Problem: Education: Goal: Utilization of techniques to improve thought processes will improve Outcome: Progressing Goal: Knowledge of the prescribed therapeutic regimen will improve Outcome: Progressing   Problem: Activity: Goal: Interest or engagement in leisure activities will improve Outcome: Progressing Goal: Imbalance in normal sleep/wake cycle will improve Outcome: Progressing   Problem: Coping: Goal: Coping ability will improve Outcome: Progressing Goal: Will verbalize feelings Outcome: Progressing   Problem: Health Behavior/Discharge Planning: Goal: Ability to make decisions will improve Outcome: Progressing Goal: Compliance with  therapeutic regimen will improve Outcome: Progressing   Problem: Role Relationship: Goal: Will demonstrate positive changes in social behaviors and relationships Outcome: Progressing   Problem: Safety: Goal: Ability to disclose and discuss suicidal ideas will improve Outcome: Progressing Goal: Ability to identify and utilize support systems that promote safety will improve Outcome: Progressing   Problem: Self-Concept: Goal: Will verbalize positive feelings about self Outcome: Progressing Goal: Level of anxiety will decrease Outcome: Progressing

## 2020-07-28 NOTE — Progress Notes (Signed)
Patient pleasant during assessment denying SI/HI/AVH. Patient endorsees pain related to her pregnancy. OB checked assessed her. Patient compliant with medication administration per MD orders. Patient observed interacting appropriately with staff and peers on the unit. Patient being monitored Q 15 minutes for safety per unit protocol. Patient remains safe on the unit.

## 2020-07-28 NOTE — Progress Notes (Signed)
[facility]  07/28/2020 3:47 PM [name]  MRN:  [id number] Subjective: Follow-up for patient with substance abuse mood instability and third trimester pregnancy.  Patient continues to state that she feels like week, the psychiatry ward, are "not doing anything" for her.  She seems to have gotten the impression somehow that she was supposed to be admitted to a medical service bed.  Patient was reminded that options had been available for substance abuse inpatient treatment but she has refused to acknowledge having a substance abuse problem with those facilities.  In any case she is currently not suicidal or psychotic and not really benefiting from inpatient psychiatric treatment Principal Problem: MDD (major depressive disorder), recurrent severe, without psychosis (HCC) Diagnosis: Principal Problem:   MDD (major depressive disorder), recurrent severe, without psychosis (HCC) Active Problems:   Abdominal pain in pregnancy, third trimester   Supervision of high risk pregnancy, antepartum   Substance abuse complicating pregnancy, antepartum   Bipolar disease in pregnancy, unspecified trimester (HCC)   Genital herpes affecting pregnancy   Abnormal glucose tolerance test in pregnancy   High risk pregnancy due to history of preterm labor, antepartum   Chronic hypertension during pregnancy, antepartum   History of prior pregnancy with IUGR newborn   Asthma affecting pregnancy, antepartum   History of fetal anomaly in prior pregnancy, currently pregnant   Diet controlled gestational diabetes mellitus (GDM) in third trimester   Intrauterine growth restriction, antepartum, third trimester, not applicable or unspecified fetus  Total Time spent with patient: 30 minutes  Past Psychiatric History: Patient has a long history of substance abuse behavior problem mood instability p  Past Medical History:  Past Medical History:  Diagnosis Date  . Anxiety   . Depression   . Gestational  diabetes   . History of substance abuse (HCC)   . History of suicide attempt   . History of thyroid disease   . Hypertension   . Thyroid disease     Past Surgical History:  Procedure Laterality Date  . CESAREAN SECTION  2000, 2002, 2013  . WISDOM TOOTH EXTRACTION     Family History:  Family History  Problem Relation Age of Onset  . Diabetes Mother    Family Psychiatric  History: None reported Social History:  Social History   Substance and Sexual Activity  Alcohol Use Not Currently   Comment: social drinker     Social History   Substance and Sexual Activity  Drug Use Not Currently  . Types: Cocaine, Marijuana   Comment: Positive UDS 12/2017    Social History   Socioeconomic History  . Marital status: Single    Spouse name: Not on file  . Number of children: Not on file  . Years of education: Not on file  . Highest education level: Not on file  Occupational History  . Not on file  Tobacco Use  . Smoking status: Former Smoker    Packs/day: 0.50  . Smokeless tobacco: Never Used  Substance and Sexual Activity  . Alcohol use: Not Currently    Comment: social drinker  . Drug use: Not Currently    Types: Cocaine, Marijuana    Comment: Positive UDS 12/2017  . Sexual activity: Not Currently    Birth control/protection: None  Other Topics Concern  . Not on file  Social History Narrative  . Not on file   Social Determinants of Health   Financial Resource Strain:   . Difficulty of Paying Living Expenses:   Food  Insecurity:   . Worried About Programme researcher, broadcasting/film/video in the Last Year:   . Barista in the Last Year:   Transportation Needs:   . Freight forwarder (Medical):   Marland Kitchen Lack of Transportation (Non-Medical):   Physical Activity:   . Days of Exercise per Week:   . Minutes of Exercise per Session:   Stress:   . Feeling of Stress :   Social Connections:   . Frequency of Communication with Friends and Family:   . Frequency of Social Gatherings with  Friends and Family:   . Attends Religious Services:   . Active Member of Clubs or Organizations:   . Attends Banker Meetings:   Marland Kitchen Marital Status:    Additional Social History:                         Sleep: Fair  Appetite:  Fair  Current Medications: Current Facility-Administered Medications  Medication Dose Route Frequency Provider Last Rate Last Admin  . acetaminophen (TYLENOL) tablet 650 mg  650 mg Oral Q6H PRN Jearld Lesch, NP   650 mg at 07/28/20 0756  . alum & mag hydroxide-simeth (MAALOX/MYLANTA) 200-200-20 MG/5ML suspension 30 mL  30 mL Oral Q4H PRN Dixon, Rashaun M, NP   30 mL at 07/24/20 1108  . ARIPiprazole (ABILIFY) tablet 5 mg  5 mg Oral QHS Roselind Messier, MD   5 mg at 07/27/20 2111  . ARIPiprazole ER (ABILIFY MAINTENA) injection 400 mg  400 mg Intramuscular Once Roselind Messier, MD      . calcium carbonate (TUMS - dosed in mg elemental calcium) chewable tablet 200 mg of elemental calcium  1 tablet Oral BID Antonieta Pert, MD   200 mg of elemental calcium at 07/27/20 1645  . cyclobenzaprine (FLEXERIL) tablet 5 mg  5 mg Oral TID PRN Roselind Messier, MD      . diphenhydrAMINE (BENADRYL) capsule 50 mg  50 mg Oral Q6H PRN Jearld Lesch, NP   50 mg at 07/28/20 0454  . haloperidol (HALDOL) tablet 5 mg  5 mg Oral Once PRN Antonieta Pert, MD       Or  . haloperidol lactate (HALDOL) injection 5 mg  5 mg Intramuscular Once PRN Antonieta Pert, MD      . magnesium hydroxide (MILK OF MAGNESIA) suspension 30 mL  30 mL Oral Daily PRN Jearld Lesch, NP      . prenatal multivitamin tablet 1 tablet  1 tablet Oral Q1200 Jearld Lesch, NP   1 tablet at 07/28/20 1155  . sertraline (ZOLOFT) tablet 50 mg  50 mg Oral Daily Dixon, Rashaun M, NP   50 mg at 07/28/20 0756  . traZODone (DESYREL) tablet 50 mg  50 mg Oral QHS Roselind Messier, MD   50 mg at 07/25/20 2113    Lab Results:  Results for orders placed or performed in visit on 07/26/20  (from the past 48 hour(s))  Glucose, capillary     Status: None   Collection Time: 07/26/20  4:01 PM  Result Value Ref Range   Glucose-Capillary 90 70 - 99 mg/dL    Comment: Glucose reference range applies only to samples taken after fasting for at least 8 hours.  Glucose, capillary     Status: Abnormal   Collection Time: 07/26/20  9:13 PM  Result Value Ref Range   Glucose-Capillary 102 (H) 70 - 99 mg/dL    Comment:  Glucose reference range applies only to samples taken after fasting for at least 8 hours.  Glucose, capillary     Status: None   Collection Time: 07/27/20  7:45 AM  Result Value Ref Range   Glucose-Capillary 85 70 - 99 mg/dL    Comment: Glucose reference range applies only to samples taken after fasting for at least 8 hours.  Glucose, capillary     Status: None   Collection Time: 07/27/20 11:44 AM  Result Value Ref Range   Glucose-Capillary 73 70 - 99 mg/dL    Comment: Glucose reference range applies only to samples taken after fasting for at least 8 hours.  Glucose, capillary     Status: None   Collection Time: 07/27/20  4:17 PM  Result Value Ref Range   Glucose-Capillary 92 70 - 99 mg/dL    Comment: Glucose reference range applies only to samples taken after fasting for at least 8 hours.   Comment 1 Notify RN    Comment 2 Document in Chart   Glucose, capillary     Status: None   Collection Time: 07/27/20  9:07 PM  Result Value Ref Range   Glucose-Capillary 93 70 - 99 mg/dL    Comment: Glucose reference range applies only to samples taken after fasting for at least 8 hours.   Comment 1 Notify RN   Glucose, capillary     Status: None   Collection Time: 07/28/20  7:13 AM  Result Value Ref Range   Glucose-Capillary 76 70 - 99 mg/dL    Comment: Glucose reference range applies only to samples taken after fasting for at least 8 hours.   Comment 1 Notify RN   Glucose, capillary     Status: Abnormal   Collection Time: 07/28/20  9:05 AM  Result Value Ref Range    Glucose-Capillary 112 (H) 70 - 99 mg/dL    Comment: Glucose reference range applies only to samples taken after fasting for at least 8 hours.  Glucose, capillary     Status: None   Collection Time: 07/28/20  1:01 PM  Result Value Ref Range   Glucose-Capillary 92 70 - 99 mg/dL    Comment: Glucose reference range applies only to samples taken after fasting for at least 8 hours.    Blood Alcohol level:  Lab Results  Component Value Date   ETH 131 (H) 07/15/2020   ETH 89 (H) 06/02/2020    Metabolic Disorder Labs: Lab Results  Component Value Date   HGBA1C 5.4 07/21/2020   MPG 108 07/21/2020   MPG 111.15 06/05/2020   No results found for: PROLACTIN Lab Results  Component Value Date   CHOL 218 (H) 01/02/2018   TRIG 187 (H) 01/02/2018   HDL 81 01/02/2018   CHOLHDL 2.7 01/02/2018   VLDL 37 01/02/2018   LDLCALC 100 (H) 01/02/2018   LDLCALC 82 01/12/2016    Physical Findings: AIMS:  , ,  ,  ,    CIWA:    COWS:     Musculoskeletal: Strength & Muscle Tone: within normal limits Gait & Station: normal Patient leans: N/A  Psychiatric Specialty Exam: Physical Exam Vitals and nursing note reviewed.  Constitutional:      Appearance: She is well-developed.  HENT:     Head: Normocephalic and atraumatic.  Eyes:     Conjunctiva/sclera: Conjunctivae normal.     Pupils: Pupils are equal, round, and reactive to light.  Cardiovascular:     Heart sounds: Normal heart sounds.  Pulmonary:  Effort: Pulmonary effort is normal.  Abdominal:     Palpations: Abdomen is soft.    Musculoskeletal:        General: Normal range of motion.     Cervical back: Normal range of motion.  Skin:    General: Skin is warm and dry.  Neurological:     Mental Status: She is alert.  Psychiatric:        Attention and Perception: Attention normal.        Mood and Affect: Mood normal. Affect is blunt.        Speech: Speech normal.        Behavior: Behavior normal.        Thought Content: Thought  content normal.        Cognition and Memory: Cognition normal.        Judgment: Judgment normal.     Review of Systems  Constitutional: Negative.   HENT: Negative.   Eyes: Negative.   Respiratory: Negative.   Cardiovascular: Negative.   Gastrointestinal: Negative.   Musculoskeletal: Negative.   Skin: Negative.   Neurological: Negative.   Psychiatric/Behavioral: Negative.     Blood pressure 115/63, pulse (!) 57, temperature 98.1 F (36.7 C), temperature source Oral, resp. rate 18, height 4\' 11"  (1.499 m), weight 76 kg, last menstrual period 12/04/2019, SpO2 100 %.Body mass index is 33.84 kg/m.  General Appearance: Casual  Eye Contact:  Good  Speech:  Clear and Coherent  Volume:  Normal  Mood:  Dysphoric  Affect:  Congruent  Thought Process:  Coherent  Orientation:  Full (Time, Place, and Person)  Thought Content:  Logical  Suicidal Thoughts:  No  Homicidal Thoughts:  No  Memory:  Immediate;   Fair Recent;   Fair Remote;   Fair  Judgement:  Impaired  Insight:  Shallow  Psychomotor Activity:  Decreased  Concentration:  Concentration: Fair  Recall:  14/09/2019 of Knowledge:  Fair  Language:  Fair  Akathisia:  No  Handed:  Right  AIMS (if indicated):     Assets:  Desire for Improvement  ADL's:  Impaired  Cognition:  Impaired,  Mild  Sleep:  Number of Hours: 5.15     Treatment Plan Summary: Daily contact with patient to assess and evaluate symptoms and progress in treatment, Medication management and Plan Patient is mildly dysphoric but not severely depressed and not suicidal or psychotic.  Psychiatrically stable.  I have advised her that if we cannot find an appropriate facility that would benefit her including substance abuse treatment to which she would consent we will probably be wanting to discharge her soon.  I have reach back out to the maternal-fetal medicine physician who saw her 2 days ago just to clarify if there is any indication for medical hospitalization.   No change otherwise to medicine for today.  [employer], MD 07/28/2020, 3:47 PM

## 2020-07-29 LAB — GLUCOSE, CAPILLARY
Glucose-Capillary: 104 mg/dL — ABNORMAL HIGH (ref 70–99)
Glucose-Capillary: 82 mg/dL (ref 70–99)
Glucose-Capillary: 96 mg/dL (ref 70–99)
Glucose-Capillary: 143 mg/dL — ABNORMAL HIGH (ref 70–99)

## 2020-07-29 MED ORDER — TRAZODONE HCL 50 MG PO TABS: 50.0000 mg | ORAL_TABLET | Freq: Every day | ORAL | Status: DC

## 2020-07-29 MED ORDER — ARIPIPRAZOLE 5 MG PO TABS: 5.0000 mg | ORAL_TABLET | Freq: Every day | ORAL | Status: DC

## 2020-07-29 MED ORDER — ARIPIPRAZOLE ER 400 MG IM SRER: 400.0000 mg | Freq: Once | INTRAMUSCULAR | 0 refills | Status: DC

## 2020-07-29 NOTE — Progress Notes (Signed)
[facility] MD Progress Note  07/29/2020 10:44 AM [name]  MRN:  [id number] Subjective: Follow-up for this 35 year old woman with depression and substance abuse who is just a little short of [redacted] weeks pregnant.  Patient continues to complain of feeling like nobody is doing anything to help her.  Mood stays dysphoric and anxious.  Passive hopelessness and passive suicidality but with no intent or plan.  No evidence of psychosis.  Focused on physical complaints such as wanting more and different food.  Some collateral information from other members of the treatment team patient continues to talk about getting drunk after she leaves the hospital and shows limited insight about her behavior.  She has requested several times that we transfer her "upstairs" or "to [facility]".  Blood sugars are staying stable. Principal Problem: MDD (major depressive disorder), recurrent severe, without psychosis (HCC) Diagnosis: Principal Problem:   MDD (major depressive disorder), recurrent severe, without psychosis (HCC) Active Problems:   Abdominal pain in pregnancy, third trimester   Supervision of high risk pregnancy, antepartum   Substance abuse complicating pregnancy, antepartum   Bipolar disease in pregnancy, unspecified trimester (HCC)   Genital herpes affecting pregnancy   Abnormal glucose tolerance test in pregnancy   High risk pregnancy due to history of preterm labor, antepartum   Chronic hypertension during pregnancy, antepartum   History of prior pregnancy with IUGR newborn   Asthma affecting pregnancy, antepartum   History of fetal anomaly in prior pregnancy, currently pregnant   Diet controlled gestational diabetes mellitus (GDM) in third trimester   Intrauterine growth restriction, antepartum, third trimester, not applicable or unspecified fetus  Total Time spent with patient: 30 minutes  Past Psychiatric History: Long history of substance abuse depression suicide attempt self injury multiple  hospitalizations poor compliance with outpatient follow-up  Past Medical History:  Past Medical History:  Diagnosis Date  . Anxiety   . Depression   . Gestational diabetes   . History of substance abuse (HCC)   . History of suicide attempt   . History of thyroid disease   . Hypertension   . Thyroid disease     Past Surgical History:  Procedure Laterality Date  . CESAREAN SECTION  2000, 2002, 2013  . WISDOM TOOTH EXTRACTION     Family History:  Family History  Problem Relation Age of Onset  . Diabetes Mother    Family Psychiatric  History: See previous Social History:  Social History   Substance and Sexual Activity  Alcohol Use Not Currently   Comment: social drinker     Social History   Substance and Sexual Activity  Drug Use Not Currently  . Types: Cocaine, Marijuana   Comment: Positive UDS 12/2017    Social History   Socioeconomic History  . Marital status: Single    Spouse name: Not on file  . Number of children: Not on file  . Years of education: Not on file  . Highest education level: Not on file  Occupational History  . Not on file  Tobacco Use  . Smoking status: Former Smoker    Packs/day: 0.50  . Smokeless tobacco: Never Used  Substance and Sexual Activity  . Alcohol use: Not Currently    Comment: social drinker  . Drug use: Not Currently    Types: Cocaine, Marijuana    Comment: Positive UDS 12/2017  . Sexual activity: Not Currently    Birth control/protection: None  Other Topics Concern  . Not on file  Social History Narrative  .  Not on file   Social Determinants of Health   Financial Resource Strain:   . Difficulty of Paying Living Expenses:   Food Insecurity:   . Worried About Programme researcher, broadcasting/film/videounning Out of Food in the Last Year:   . Baristaan Out of Food in the Last Year:   Transportation Needs:   . Freight forwarderLack of Transportation (Medical):   Marland Kitchen. Lack of Transportation (Non-Medical):   Physical Activity:   . Days of Exercise per Week:   . Minutes of Exercise  per Session:   Stress:   . Feeling of Stress :   Social Connections:   . Frequency of Communication with Friends and Family:   . Frequency of Social Gatherings with Friends and Family:   . Attends Religious Services:   . Active Member of Clubs or Organizations:   . Attends BankerClub or Organization Meetings:   Marland Kitchen. Marital Status:    Additional Social History:                         Sleep: Fair  Appetite:  Good  Current Medications: Current Facility-Administered Medications  Medication Dose Route Frequency Provider Last Rate Last Admin  . acetaminophen (TYLENOL) tablet 650 mg  650 mg Oral Q6H PRN Jearld Leschixon, Rashaun M, NP   650 mg at 07/29/20 0914  . alum & mag hydroxide-simeth (MAALOX/MYLANTA) 200-200-20 MG/5ML suspension 30 mL  30 mL Oral Q4H PRN Dixon, Rashaun M, NP   30 mL at 07/24/20 1108  . ARIPiprazole (ABILIFY) tablet 5 mg  5 mg Oral QHS Roselind Messierao, Ramakrishna, MD   5 mg at 07/28/20 2118  . ARIPiprazole ER (ABILIFY MAINTENA) injection 400 mg  400 mg Intramuscular Once Roselind Messierao, Ramakrishna, MD      . calcium carbonate (TUMS - dosed in mg elemental calcium) chewable tablet 200 mg of elemental calcium  1 tablet Oral BID Antonieta Pertlary, Greg Lawson, MD   200 mg of elemental calcium at 07/29/20 0823  . cyclobenzaprine (FLEXERIL) tablet 5 mg  5 mg Oral TID PRN Roselind Messierao, Ramakrishna, MD      . diphenhydrAMINE (BENADRYL) capsule 50 mg  50 mg Oral Q6H PRN Jearld Leschixon, Rashaun M, NP   50 mg at 07/28/20 2118  . haloperidol (HALDOL) tablet 5 mg  5 mg Oral Once PRN Antonieta Pertlary, Greg Lawson, MD       Or  . haloperidol lactate (HALDOL) injection 5 mg  5 mg Intramuscular Once PRN Antonieta Pertlary, Greg Lawson, MD      . magnesium hydroxide (MILK OF MAGNESIA) suspension 30 mL  30 mL Oral Daily PRN Jearld Leschixon, Rashaun M, NP      . prenatal multivitamin tablet 1 tablet  1 tablet Oral Q1200 Jearld Leschixon, Rashaun M, NP   1 tablet at 07/28/20 1155  . sertraline (ZOLOFT) tablet 50 mg  50 mg Oral Daily Dixon, Rashaun M, NP   50 mg at 07/29/20 0823  .  traZODone (DESYREL) tablet 50 mg  50 mg Oral QHS Roselind Messierao, Ramakrishna, MD   50 mg at 07/25/20 2113    Lab Results:  Results for orders placed or performed in visit on 07/26/20 (from the past 48 hour(s))  Glucose, capillary     Status: None   Collection Time: 07/27/20 11:44 AM  Result Value Ref Range   Glucose-Capillary 73 70 - 99 mg/dL    Comment: Glucose reference range applies only to samples taken after fasting for at least 8 hours.  Glucose, capillary     Status: None  Collection Time: 07/27/20  4:17 PM  Result Value Ref Range   Glucose-Capillary 92 70 - 99 mg/dL    Comment: Glucose reference range applies only to samples taken after fasting for at least 8 hours.   Comment 1 Notify RN    Comment 2 Document in Chart   Glucose, capillary     Status: None   Collection Time: 07/27/20  9:07 PM  Result Value Ref Range   Glucose-Capillary 93 70 - 99 mg/dL    Comment: Glucose reference range applies only to samples taken after fasting for at least 8 hours.   Comment 1 Notify RN   Glucose, capillary     Status: None   Collection Time: 07/28/20  7:13 AM  Result Value Ref Range   Glucose-Capillary 76 70 - 99 mg/dL    Comment: Glucose reference range applies only to samples taken after fasting for at least 8 hours.   Comment 1 Notify RN   Glucose, capillary     Status: Abnormal   Collection Time: 07/28/20  9:05 AM  Result Value Ref Range   Glucose-Capillary 112 (H) 70 - 99 mg/dL    Comment: Glucose reference range applies only to samples taken after fasting for at least 8 hours.  Glucose, capillary     Status: None   Collection Time: 07/28/20  1:01 PM  Result Value Ref Range   Glucose-Capillary 92 70 - 99 mg/dL    Comment: Glucose reference range applies only to samples taken after fasting for at least 8 hours.  Glucose, capillary     Status: Abnormal   Collection Time: 07/28/20  6:21 PM  Result Value Ref Range   Glucose-Capillary 104 (H) 70 - 99 mg/dL    Comment: Glucose reference  range applies only to samples taken after fasting for at least 8 hours.  Glucose, capillary     Status: None   Collection Time: 07/29/20  7:45 AM  Result Value Ref Range   Glucose-Capillary 82 70 - 99 mg/dL    Comment: Glucose reference range applies only to samples taken after fasting for at least 8 hours.    Blood Alcohol level:  Lab Results  Component Value Date   ETH 131 (H) 07/15/2020   ETH 89 (H) 06/02/2020    Metabolic Disorder Labs: Lab Results  Component Value Date   HGBA1C 5.4 07/21/2020   MPG 108 07/21/2020   MPG 111.15 06/05/2020   No results found for: PROLACTIN Lab Results  Component Value Date   CHOL 218 (H) 01/02/2018   TRIG 187 (H) 01/02/2018   HDL 81 01/02/2018   CHOLHDL 2.7 01/02/2018   VLDL 37 01/02/2018   LDLCALC 100 (H) 01/02/2018   LDLCALC 82 01/12/2016    Physical Findings: AIMS:  , ,  ,  ,    CIWA:    COWS:     Musculoskeletal: Strength & Muscle Tone: within normal limits Gait & Station: normal Patient leans: N/A  Psychiatric Specialty Exam: Physical Exam Constitutional:      Appearance: She is well-developed.  HENT:     Head: Normocephalic and atraumatic.  Eyes:     Conjunctiva/sclera: Conjunctivae normal.     Pupils: Pupils are equal, round, and reactive to light.  Cardiovascular:     Heart sounds: Normal heart sounds.  Pulmonary:     Effort: Pulmonary effort is normal.  Abdominal:     Palpations: Abdomen is soft.    Musculoskeletal:        General: Normal  range of motion.     Cervical back: Normal range of motion.  Skin:    General: Skin is warm and dry.  Neurological:     Mental Status: She is alert.  Psychiatric:        Attention and Perception: Attention normal.        Mood and Affect: Mood is anxious and depressed.        Speech: Speech is delayed.        Behavior: Behavior is slowed. Behavior is not aggressive.        Thought Content: Thought content is not paranoid. Thought content includes suicidal ideation.  Thought content does not include homicidal ideation. Thought content does not include suicidal plan.        Cognition and Memory: Cognition is impaired.        Judgment: Judgment is impulsive.     Review of Systems  Constitutional: Negative.   HENT: Negative.   Eyes: Negative.   Respiratory: Negative.   Cardiovascular: Negative.   Gastrointestinal: Negative.   Musculoskeletal: Negative.   Skin: Negative.   Neurological: Negative.   Psychiatric/Behavioral: Positive for dysphoric mood. The patient is nervous/anxious.     Blood pressure 115/63, pulse (!) 57, temperature 98.1 F (36.7 C), temperature source Oral, resp. rate 18, height 4\' 11"  (1.499 m), weight 76 kg, last menstrual period 12/04/2019, SpO2 100 %.Body mass index is 33.84 kg/m.  General Appearance: Casual  Eye Contact:  Good  Speech:  Clear and Coherent  Volume:  Normal  Mood:  Dysphoric  Affect:  Congruent  Thought Process:  Goal Directed  Orientation:  Full (Time, Place, and Person)  Thought Content:  Tangential  Suicidal Thoughts:  Yes.  without intent/plan  Homicidal Thoughts:  No  Memory:  Immediate;   Fair Recent;   Fair Remote;   Fair  Judgement:  Impaired  Insight:  Shallow  Psychomotor Activity:  Normal  Concentration:  Concentration: Fair  Recall:  14/09/2019 of Knowledge:  Fair  Language:  Fair  Akathisia:  No  Handed:  Right  AIMS (if indicated):     Assets:  Communication Skills Desire for Improvement Financial Resources/Insurance Resilience  ADL's:  Intact  Cognition:  Impaired,  Mild  Sleep:  Number of Hours: 7.5     Treatment Plan Summary: Daily contact with patient to assess and evaluate symptoms and progress in treatment, Medication management and Plan Today I spoke with [employer], the midwife working with [facility].  They are very familiar with the patient.  We reviewed the patient's high risk pregnancy due to her intrauterine growth retardation and her high risk of further  complications including fetal demise related not only to this but her behavior problems such as substance abuse and lack of resources for delivery and prenatal care.  At her recommendation as well as the encouragement of the patient I spoke with patient logistics at [facility] concerning the possibility of transfer to the perinatal psychiatry unit.  I have provided the appropriate information, faxed the information, and left my cell phone number as a contact.  Meanwhile no indication to change psychiatric medicine.  Encourage patient to cooperate with groups and participate on the unit.  Diet has been changed back to regular at her insistence.  No other change to treatment plan for today.  We will follow-up with the patient about the possibility of any transfer.  [facility], MD 07/29/2020, 10:44 AM

## 2020-07-29 NOTE — Progress Notes (Signed)
Patient got accepted at [facility] at 4 Neuro bed number 4103.As per the Charge Nurse they will hold her bed for tomorrow since we cannot do a direct transfer from here today.Tried [facility] for transportation.

## 2020-07-29 NOTE — Plan of Care (Signed)
Patient calm and pleasant this evening, expected DC tomorrow  Problem: Education: Goal: Emotional status will improve Outcome: Progressing Goal: Mental status will improve Outcome: Progressing

## 2020-07-29 NOTE — BHH Suicide Risk Assessment (Signed)
[facility] Discharge Suicide Risk Assessment   Principal Problem: MDD (major depressive disorder), recurrent severe, without psychosis (HCC) Discharge Diagnoses: Principal Problem:   MDD (major depressive disorder), recurrent severe, without psychosis (HCC) Active Problems:   Abdominal pain in pregnancy, third trimester   Supervision of high risk pregnancy, antepartum   Substance abuse complicating pregnancy, antepartum   Bipolar disease in pregnancy, unspecified trimester (HCC)   Genital herpes affecting pregnancy   Abnormal glucose tolerance test in pregnancy   High risk pregnancy due to history of preterm labor, antepartum   Chronic hypertension during pregnancy, antepartum   History of prior pregnancy with IUGR newborn   Asthma affecting pregnancy, antepartum   History of fetal anomaly in prior pregnancy, currently pregnant   Diet controlled gestational diabetes mellitus (GDM) in third trimester   Intrauterine growth restriction, antepartum, third trimester, not applicable or unspecified fetus   Total Time spent with patient: 30 minutes  Musculoskeletal: Strength & Muscle Tone: within normal limits Gait & Station: normal Patient leans: N/A  Psychiatric Specialty Exam: Review of Systems  Constitutional: Negative.   HENT: Negative.   Eyes: Negative.   Respiratory: Negative.   Cardiovascular: Negative.   Gastrointestinal: Negative.   Musculoskeletal: Negative.   Skin: Negative.   Neurological: Negative.   Psychiatric/Behavioral: Negative.     Blood pressure 115/63, pulse (!) 57, temperature 98.1 F (36.7 C), temperature source Oral, resp. rate 18, height 4\' 11"  (1.499 m), weight 76 kg, last menstrual period 12/04/2019, SpO2 100 %.Body mass index is 33.84 kg/m.  General Appearance: Casual  Eye Contact::  Good  Speech:  Clear and Coherent409  Volume:  Normal  Mood:  Euthymic  Affect:  Congruent  Thought Process:  Coherent  Orientation:  Full (Time, Place, and Person)   Thought Content:  Logical  Suicidal Thoughts:  No  Homicidal Thoughts:  No  Memory:  Negative  Judgement:  Negative  Insight:  Shallow  Psychomotor Activity:  Normal  Concentration:  Fair  Recall:  Fair  Fund of Knowledge:Fair  Language: Fair  Akathisia:  No  Handed:  Right  AIMS (if indicated):     Assets:  Desire for Improvement  Sleep:  Number of Hours: 7.5  Cognition: WNL  ADL's:  Intact   Mental Status Per Nursing Assessment::   On Admission:  NA  Demographic Factors:  Low socioeconomic status  Loss Factors: Decrease in vocational status  Historical Factors: Impulsivity  Risk Reduction Factors:   Responsible for children under 69 years of age  Continued Clinical Symptoms:  Bipolar Disorder:   Mixed State  Cognitive Features That Contribute To Risk:  Closed-mindedness    Suicide Risk:  Minimal: No identifiable suicidal ideation.  Patients presenting with no risk factors but with morbid ruminations; may be classified as minimal risk based on the severity of the depressive symptoms   Follow-up Information    [facility] Follow up.   Why: Your CS Team will meet with you on.... Contact information: [address] [place] [phone]               Plan Of Care/Follow-up recommendations:  Other:  [facility]  [phone], MD 07/29/2020, 6:13 PM

## 2020-07-29 NOTE — Progress Notes (Signed)
BRIEF PHARMACY NOTE   This patient attended and participated in Medication Management Group counseling led by [facility] staff pharmacist.  This interactive class reviews basic information about prescription medications and education on personal responsibility in medication management.  The class also includes general knowledge of 3 main classes of behavioral medications, including antipsychotics, antidepressants, and mood stabilizers.     Patient behavior was appropriate for group setting.   Educational materials sourced from:  "Medication Do's and Don'ts" from [email or web address]   "Mental Health Medications" from [facility] [email or web address]    Tressie Ellis 07/29/2020 , 3:25 PM

## 2020-07-29 NOTE — Progress Notes (Signed)
Patient pleasant during assessment denying SI/HI/AVH. Patient endorsees pain related to her pregnancy. OB checked assessed her. Patient compliant with medication administration per MD orders. Patient observed interacting appropriately with staff and peers on the unit. Patient being monitored Q 15 minutes for safety per unit protocol. Patient remains safe on the unit.  

## 2020-07-29 NOTE — Progress Notes (Addendum)
Recreation Therapy Notes  Date: 07/29/2020  Time: 9:30 am  Location: Court yard   Behavioral response: Appropriate  Intervention Topic: Leisure     Discussion/Intervention:  Group content on today was focused on Leisure skills. The group expressed what leisure is and how often they participate in it. Individuals expressed the difference between positive and negative leisure. Patients described how they feel when they participate in leisure and why it is important to have leisure time. The group expressed how they go about picking a leisure and if it involves others. Individuals stated how many leisure activities they have to pick from. Patients participated in the intervention "Name the Leisure", each patient had the opportunity to learn a new positive leisure as well as work with their peers. Clinical Observations/Feedback:  Patient came to group and was focused on what peers and staff had to say about leisure. She identified listening to music, eating and playing cards as leisure she enjoys. Individual was social with peers and staff while participating in the intervention. [name] LRT/CTRS         [name] 07/29/2020 11:15 AM

## 2020-07-29 NOTE — Plan of Care (Signed)
This morning patient appears sad and with tears states " I miss my mom." Denies SI,HI and AVH.Patient did not verbalize any issues with the trays today.Appropriate with staff & peers.Compliant with medications.Support and encouragement given.

## 2020-07-29 NOTE — BHH Counselor (Signed)
CSW has faxed referral for [facility] at patient's request.  Penni Homans, MSW, LCSW 07/29/2020 2:41 PM

## 2020-07-30 LAB — SARS CORONAVIRUS 2 BY RT PCR (HOSPITAL ORDER, PERFORMED IN ~~LOC~~ HOSPITAL LAB): SARS Coronavirus 2: NEGATIVE

## 2020-07-30 LAB — GLUCOSE, CAPILLARY
Glucose-Capillary: 111 mg/dL — ABNORMAL HIGH (ref 70–99)
Glucose-Capillary: 84 mg/dL (ref 70–99)

## 2020-07-30 NOTE — BHH Counselor (Signed)
CSW printed the facesheet and full transfer report and provided to the patients' nurse.   Penni Homans, MSW, LCSW 07/30/2020 10:49 AM

## 2020-07-30 NOTE — Progress Notes (Signed)
Recreation Therapy Notes  INPATIENT RECREATION TR PLAN  Patient Details Name: [name] MRN: [id number] DOB: Feb 22, 1986 Today's Date: 07/30/2020  Rec Therapy Plan Is patient appropriate for Therapeutic Recreation?: Yes Treatment times per week: at least 3 Estimated Length of Stay: 5-7 days TR Treatment/Interventions: Group participation (Comment)  Discharge Criteria Pt will be discharged from therapy if:: Discharged Treatment plan/goals/alternatives discussed and agreed upon by:: Patient/family  Discharge Summary Short term goals set: Patient will engage in groups without prompting or encouragement from LRT x3 group sessions within 5 recreation therapy group sessions Short term goals met: Complete Progress toward goals comments: Groups attended Which groups?: Leisure education, Self-esteem, Coping skills Reason goals not met: N/A Therapeutic equipment acquired: N/A Reason patient discharged from therapy: Discharge from hospital Pt/family agrees with progress & goals achieved: Yes Date patient discharged from therapy: 07/30/20   [name] 07/30/2020, 3:45 PM

## 2020-07-30 NOTE — BHH Counselor (Signed)
CSW updated the patient's care coordinator [name], [phone] that patient will be transferring to [facility].  Penni Homans, MSW, LCSW 07/30/2020 12:49 PM

## 2020-07-30 NOTE — Discharge Summary (Signed)
Physician Discharge Summary Note  Patient:  [name] is an 34 y.o., female MRN:  [id number] DOB:  04/22/1986 Patient phone:  [phone] (home)  Patient address:   [address] [place] [address],  Total Time spent with patient: 30 minutes  Date of Admission:  07/18/2020 Date of Discharge: July 30, 2020  Reason for Admission: Patient was admitted when she presented through the emergency room with worsening symptoms of depression with dysphoric mood hopelessness passive suicidal thoughts and relapse into use of alcohol and cocaine while pregnant.  Principal Problem: MDD (major depressive disorder), recurrent severe, without psychosis (HCC) Discharge Diagnoses: Principal Problem:   MDD (major depressive disorder), recurrent severe, without psychosis (HCC) Active Problems:   Abdominal pain in pregnancy, third trimester   Supervision of high risk pregnancy, antepartum   Substance abuse complicating pregnancy, antepartum   Bipolar disease in pregnancy, unspecified trimester (HCC)   Genital herpes affecting pregnancy   Abnormal glucose tolerance test in pregnancy   High risk pregnancy due to history of preterm labor, antepartum   Chronic hypertension during pregnancy, antepartum   History of prior pregnancy with IUGR newborn   Asthma affecting pregnancy, antepartum   History of fetal anomaly in prior pregnancy, currently pregnant   Diet controlled gestational diabetes mellitus (GDM) in third trimester   Intrauterine growth restriction, antepartum, third trimester, not applicable or unspecified fetus   Past Psychiatric History: Patient has a history going back many years of recurrent episodes of mood problems with a past history of self injury and suicidal thoughts complicated by a 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  Past Medical History:  Past Medical History:  Diagnosis Date  . Anxiety   . Depression   . Gestational diabetes   . History of  substance abuse (HCC)   . History of suicide attempt   . History of thyroid disease   . Hypertension   . Thyroid disease     Past Surgical History:  Procedure Laterality Date  . CESAREAN SECTION  2000, 2002, 2013  . WISDOM TOOTH EXTRACTION     Family History:  Family History  Problem Relation Age of Onset  . Diabetes Mother    Family Psychiatric  History: None reported Social History:  Social History   Substance and Sexual Activity  Alcohol Use Not Currently   Comment: social drinker     Social History   Substance and Sexual Activity  Drug Use Not Currently  . Types: Cocaine, Marijuana   Comment: Positive UDS 12/2017    Social History   Socioeconomic History  . Marital status: Single    Spouse name: Not on file  . Number of children: Not on file  . Years of education: Not on file  . Highest education level: Not on file  Occupational History  . Not on file  Tobacco Use  . Smoking status: Former Smoker    Packs/day: 0.50  . Smokeless tobacco: Never Used  Substance and Sexual Activity  . Alcohol use: Not Currently    Comment: social drinker  . Drug use: Not Currently    Types: Cocaine, Marijuana    Comment: Positive UDS 12/2017  . Sexual activity: Not Currently    Birth control/protection: None  Other Topics Concern  . Not on file  Social History Narrative  . Not on file   Social Determinants of Health   Financial Resource Strain:   . Difficulty of Paying Living Expenses:   Food Insecurity:   .  Worried About Programme researcher, broadcasting/film/videounning Out of Food in the Last Year:   . Baristaan Out of Food in the Last Year:   Transportation Needs:   . Freight forwarderLack of Transportation (Medical):   Marland Kitchen. Lack of Transportation (Non-Medical):   Physical Activity:   . Days of Exercise per Week:   . Minutes of Exercise per Session:   Stress:   . Feeling of Stress :   Social Connections:   . Frequency of Communication with Friends and Family:   . Frequency of Social Gatherings with Friends and Family:   .  Attends Religious Services:   . Active Member of Clubs or Organizations:   . Attends BankerClub or Organization Meetings:   Marland Kitchen. Marital Status:     Hospital Course: Patient admitted to psychiatric unit.  15-minute checks maintained.  Patient was kept on modest dose of Zoloft which was tolerated well.  She was seen by OB/GYN service in consultation.  Maternal-fetal medicine evaluated the patient and found that there was some intrauterine growth delay.  Also considered the patient high risk because of her age and multiple previous cesarean section births.  On the unit the patient occasionally became emotionally upset but did not display any violent or dangerous or self-injurious behavior.  She did stay nervous and dysphoric.  Patient identified appropriately that she was at high risk for relapse of alcohol and cocaine use.  She mention to staff members that she still thought frequently about using.  Her social supports outside the hospital are very limited.  Although she does have established care at the local mental health agency she is living by herself and has little or no transportation available.  [facility] on the grounds of being likely to benefit from a higher level of care.  She is having a complicated high risk pregnancy and there is very high risk of more potentially dangerous behavior if she is released.  Additionally she remains somewhat emotionally labile although without any psychosis.  Patient is agreeable to the transfer plan and is in fact enthusiastic about this.  Not showing any acute behavior issues.  We will be [facility] and appreciate very much their assistance as well as the assistance of [facility] medicine [facility].  Patient has a history of gestational diabetes but blood sugars recently have been under good control and is currently not on medication for that.  Patient had previously been on Abilify  maintain 400 mg monthly.  She had declined however her most recent injection and so that has not been given and probably 2 months.  She is currently on just 5 mg of Abilify orally per day.  Physical Findings: AIMS:  , ,  ,  ,    CIWA:    COWS:     Musculoskeletal: Strength & Muscle Tone: within normal limits Gait & Station: normal Patient leans: N/A  Psychiatric Specialty Exam: Physical Exam Vitals and nursing note reviewed.  Constitutional:      Appearance: She is well-developed.  HENT:     Head: Normocephalic and atraumatic.  Eyes:     Conjunctiva/sclera: Conjunctivae normal.     Pupils: Pupils are equal, round, and reactive to light.  Cardiovascular:     Heart sounds: Normal heart sounds.  Pulmonary:     Effort: Pulmonary effort is normal.  Abdominal:     Palpations: Abdomen is soft.    Musculoskeletal:        General: Normal  range of motion.     Cervical back: Normal range of motion.  Skin:    General: Skin is warm and dry.  Neurological:     Mental Status: She is alert.  Psychiatric:        Attention and Perception: Attention normal.        Mood and Affect: Mood normal.        Speech: Speech normal.        Behavior: Behavior normal.        Thought Content: Thought content normal.        Cognition and Memory: Cognition normal.        Judgment: Judgment normal.     Review of Systems  Constitutional: Negative.   HENT: Negative.   Eyes: Negative.   Respiratory: Negative.   Cardiovascular: Negative.   Gastrointestinal: Negative.   Musculoskeletal: Negative.   Skin: Negative.   Neurological: Negative.   Psychiatric/Behavioral: Negative for agitation, behavioral problems, confusion, decreased concentration, dysphoric mood, hallucinations, self-injury, sleep disturbance and suicidal ideas. The patient is nervous/anxious. The patient is not hyperactive.     Blood pressure 116/72, pulse 73, temperature 97.9 F (36.6 C), temperature source Oral, resp. rate 18,  height 4\' 11"  (1.499 m), weight 76 kg, last menstrual period 12/04/2019, SpO2 100 %.Body mass index is 33.84 kg/m.  General Appearance: Casual  Eye Contact:  Good  Speech:  Clear and Coherent  Volume:  Normal  Mood:  Dysphoric  Affect:  Constricted  Thought Process:  Goal Directed  Orientation:  Full (Time, Place, and Person)  Thought Content:  Logical  Suicidal Thoughts:  No  Homicidal Thoughts:  No  Memory:  Immediate;   Fair Recent;   Fair Remote;   Fair  Judgement:  Fair  Insight:  Fair  Psychomotor Activity:  Normal  Concentration:  Concentration: Fair  Recall:  Fair  Fund of Knowledge:  Fair  Language:  Fair  Akathisia:  No  Handed:  Right  AIMS (if indicated):     Assets:  Desire for Improvement Housing Physical Health Resilience  ADL's:  Impaired  Cognition:  Impaired,  Mild  Sleep:  Number of Hours: 7        Has this patient used any form of tobacco in the last 30 days? (Cigarettes, Smokeless Tobacco, Cigars, and/or Pipes) Yes, Yes, A prescription for an FDA-approved tobacco cessation medication was offered at discharge and the patient refused  Blood Alcohol level:  Lab Results  Component Value Date   ETH 131 (H) 07/15/2020   ETH 89 (H) 06/02/2020    Metabolic Disorder Labs:  Lab Results  Component Value Date   HGBA1C 5.4 07/21/2020   MPG 108 07/21/2020   MPG 111.15 06/05/2020   No results found for: PROLACTIN Lab Results  Component Value Date   CHOL 218 (H) 01/02/2018   TRIG 187 (H) 01/02/2018   HDL 81 01/02/2018   CHOLHDL 2.7 01/02/2018   VLDL 37 01/02/2018   LDLCALC 100 (H) 01/02/2018   LDLCALC 82 01/12/2016    See Psychiatric Specialty Exam and Suicide Risk Assessment completed by Attending Physician prior to discharge.  Discharge destination:  Other:  Patient is being [facility] maternal fetal [facility]  Is patient on multiple antipsychotic therapies at discharge:  No   Has Patient had  three or more failed trials of antipsychotic monotherapy by history:  No  Recommended Plan for Multiple Antipsychotic Therapies: NA  Discharge Instructions    Diet -  low sodium heart healthy   Complete by: As directed    Increase activity slowly   Complete by: As directed      Allergies as of 07/30/2020      Reactions   Aspirin Anaphylaxis, Diarrhea, Nausea And Vomiting   Banana Anaphylaxis, Itching, Shortness Of Breath, Swelling   Other Itching, Shortness Of Breath, Swelling   Peanut Oil Itching, Shortness Of Breath, Swelling   Peanut-containing Drug Products Shortness Of Breath, Itching, Swelling   Pecan Nut (diagnostic) Anaphylaxis      Medication List    TAKE these medications     Indication  ARIPiprazole 5 MG tablet Commonly known as: ABILIFY Take 1 tablet (5 mg total) by mouth at bedtime.  Indication: Manic Phase of Manic-Depression   ARIPiprazole ER 400 MG Srer injection Commonly known as: ABILIFY MAINTENA Inject 2 mLs (400 mg total) into the muscle once for 1 dose.  Indication: Manic-Depression   prenatal multivitamin Tabs tablet Take 1 tablet by mouth daily at 12 noon.  Indication: Pregnancy   sertraline 50 MG tablet Commonly known as: ZOLOFT Take 50 mg by mouth daily.  Indication: Generalized Anxiety Disorder, Major Depressive Disorder   traZODone 50 MG tablet Commonly known as: DESYREL Take 1 tablet (50 mg total) by mouth at bedtime.  Indication: Trouble Sleeping       Follow-up Information    [facility] Follow up.   Why: Please follow up with your CST team at discharge. Contact information: [address] [place] [address] [phone]        [facility] Follow up.   Why: Please follow up for your referral. [facility] info will include infomration for [facility] which is where [facility] said they would refer you to. Contact information: [address] [place]  [address] [phone]        [facility] Follow up.   Why: Please follow up with your referral. Contact information: [address] [place] [address] [phone]               Follow-up recommendations:  Activity:  Activity as tolerated Diet:  Carbohydrate controlled diet had been recommended but the patient is very displeased with it and has insisted on being on regular diet Other:  Follow-up with further care in the hospital  Comments: No prescriptions at discharge  Signed: Mordecai Rasmussen, MD 07/30/2020, 12:52 PM.

## 2020-07-30 NOTE — BHH Counselor (Signed)
CSW called [facility] [phone] to check the status of the patient's referral.  CSW was informed that patient was DENIED due to being [redacted] weeks pregnant.  Amy at [facility] reports they do not accept anyone past 26 weeks.  She requested that CSW fax the information to the [facility].  CSW faxed the information there and received confirmation the fax was successful.  Penni Homans, MSW, LCSW 07/30/2020 12:54 PM

## 2020-07-30 NOTE — BHH Counselor (Signed)
CSW spoke with Supervisor Wandra Mannan in regard to arranging transportation for this patient due to EMTALA concerns as patient is being transferred to [facility]. CSW was unsure if EMTALA was in play if patient is being transferred.  CSW was also checking on who could prvide transportation, if [employer] was an option.   CSW was advised to use [facility].  CSW contacted [facility] and was informed that [facility].    CSW called supervisor and approved to Optician, dispensing.  CSW contacted [employer] and scheduled transportation.    CSW again contacted supervisor to discuss concerns with Transfer as the aptient was not being handed off to someone directly.  CSW was advised to contact the hospital [facility].  CSW spoke with [facility] Annice Pih who reports that she does not address EMTALA or transportation and recommended that CSW speak with someone in administration in Care Management, which is CSW department.  CSW called supervisor back to report and advise and was informed that he will reach out to someone and call this CSW back.    Penni Homans, MSW, LCSW 07/30/2020 9:56 AM

## 2020-07-30 NOTE — Progress Notes (Signed)
Patient denies SI/HI, denies A/V hallucinations. Patient verbalizes understanding of discharge instructions.Report  Given to [facility] RN. Patient given all belongings from [facility] locker. Patient escorted out by staff, transported to [facility] by [facility].

## 2020-07-30 NOTE — BHH Counselor (Signed)
CSW has since been informed that patient can NOT be transported by [employer] due to IVC status.  CSW has also been informed that the patient is in need of a new Covid screening.  Treatment team are working to identify alternative transportation.  Penni Homans, MSW, LCSW 07/30/2020 10:13 AM

## 2020-07-30 NOTE — Progress Notes (Signed)
  [facility] Adult Case Management Discharge Plan :  Will you be returning to the same living situation after discharge:  No.  Patient being transferred to [facility].  Patient being transported by EMS. At discharge, do you have transportation home?: Yes,  CSW will assist Do you have the ability to pay for your medications: Yes,  Medicaid.  Release of information consent forms completed and in the chart;  Patient's signature needed at discharge.  Patient to Follow up at:  Follow-up Information    [facility] Follow up.   Why: Please follow up with your CST team at discharge. Contact information: [address] [place] [address] [phone]        [facility] Follow up.   Why: Please follow up for your referral. [facility] info will include infomration for [facility] which is where [facility] said they would refer you to. Contact information: [address] [place] [address] [phone]        [facility] Follow up.   Why: Please follow up with your referral. Contact information: [address] [place] [address] [phone]               Next level of care provider has access to [facility] Link:no  Safety Planning and Suicide Prevention discussed: Yes,  SPE completed with the patient's sister.     Has patient been referred to the Quitline?: Patient refused referral  Patient has been referred for addiction treatment: Yes  [name], LCSW 07/30/2020, 11:49 AM

## 2020-07-30 NOTE — BHH Counselor (Signed)
Patient has been accepted to [facility].  Patient assigned to room 4 [facility] Neuro bed number 4103. Accepting physician is Dr. Romualdo Bolk. CSW spoke with Lu Duffel with [facility]. Call Report to Francis Dowse  Nurse Gigi at [facility] also spoke with Charge Nurse Sue Lush.  Penni Homans, MSW, LCSW 07/30/2020 9:58 AM

## 2020-07-30 NOTE — Progress Notes (Signed)
Patient remained stable with no new physical or mental complaints.  Behavior calm and appropriate.  Thoughts lucid.  Patient is agreeable and understanding of the plan to transfer to the perinatal psychiatry unit at [facility].  No indication for change in current treatment plan.

## 2020-07-30 NOTE — Progress Notes (Addendum)
Recreation Therapy Notes  Date: 07/30/2020  Time: 9:30 am  Location: Craft room   Behavioral response: Appropriate  Intervention Topic: Self-esteem   Discussion/Intervention:  Group content today was focused on self-esteem. Patient defined self-esteem and where it comes form. The group described reasons self-esteem is important. Individuals stated things that impact self-esteem and positive ways to improve self-esteem. The group participated in the intervention "self-butterfly" where patients were able to create a butterfly of positive things that makes them who they are. Clinical Observations/Feedback:  Patient came to group and defined self- esteem as caring about yourself. She expressed that others looking at her affects herself esteem sometimes. Individual was social with peers and staff while participating in the intervention. Casyn Becvar LRT/CTRS         Kijuan Gallicchio 07/30/2020 1:17 PM

## 2020-07-31 LAB — MATERNIT21  PLUS CORE+ESS+SCA, BLOOD
11q23 deletion (Jacobsen): NOT DETECTED
15q11 deletion (PW Angelman): NOT DETECTED
1p36 deletion syndrome: NOT DETECTED
22q11 deletion (DiGeorge): NOT DETECTED
4p16 deletion(Wolf-Hirschhorn): NOT DETECTED
5p15 deletion (Cri-du-chat): NOT DETECTED
8q24 deletion (Langer-Giedion): NOT DETECTED
Fetal Fraction: 26
Monosomy X (Turner Syndrome): NOT DETECTED
Result (T21): NEGATIVE
Trisomy 13 (Patau syndrome): NEGATIVE
Trisomy 16: NOT DETECTED
Trisomy 18 (Edwards syndrome): NEGATIVE
Trisomy 21 (Down syndrome): NEGATIVE
Trisomy 22: NOT DETECTED
XXX (Triple X Syndrome): NOT DETECTED
XXY (Klinefelter Syndrome): NOT DETECTED
XYY (Jacobs Syndrome): NOT DETECTED

## 2020-08-01 ENCOUNTER — Other Ambulatory Visit: Payer: Self-pay | Admitting: Obstetrics and Gynecology

## 2020-08-01 DIAGNOSIS — O365930 Maternal care for other known or suspected poor fetal growth, third trimester, not applicable or unspecified: Secondary | ICD-10-CM

## 2020-08-02 ENCOUNTER — Telehealth: Payer: Self-pay | Admitting: Obstetrics and Gynecology

## 2020-08-02 ENCOUNTER — Other Ambulatory Visit: Payer: Medicaid Other

## 2020-08-02 NOTE — Telephone Encounter (Signed)
I have tried mutliple times to reach Ms. Runkel with the results of her MaterniT21 testing.  The calls go directly to a voicemail that is not yet set up.  The results of her recent MaterniT21 testing yielded NEGATIVE results.  The patient's specimen showed DNA consistent with two copies of chromosomes 21, 18 and 13.  The sensitivity for trisomy 35, trisomy 24 and trisomy 59 using this testing are reported as 99.1%, 99.9% and 91.7% respectively.  Thus, while the results of this testing are highly accurate, they are not considered diagnostic at this time.  Should more definitive information be desired, the patient may still consider amniocentesis.   As requested to know by the patient, sex chromosome analysis was included for this sample.  Results are consistent with a typical number of sex chromosomes.  If the patient would like to know the gender, she may call our office or review the lab report directly in MyChart, as the gender is indicated on that report. This is predicted with >99% accuracy.  We may be reached at [phone] with any questions or concerns.  Cherly Anderson, MS, CGC

## 2020-08-15 NOTE — Discharge Summary (Signed)
Move to maternity unit for evaluation of labor pains.

## 2020-08-26 ENCOUNTER — Encounter: Payer: Self-pay | Admitting: Emergency Medicine

## 2020-08-26 ENCOUNTER — Other Ambulatory Visit: Payer: Self-pay

## 2020-08-26 ENCOUNTER — Emergency Department
Admission: EM | Admit: 2020-08-26 | Discharge: 2020-08-27 | Disposition: A | Discharge status: Home / Self Care | Payer: Medicaid Other | Attending: Emergency Medicine | Admitting: Emergency Medicine

## 2020-08-26 DIAGNOSIS — F319 Bipolar disorder, unspecified: Secondary | ICD-10-CM | POA: Diagnosis present

## 2020-08-26 DIAGNOSIS — Z91010 Allergy to peanuts: Secondary | ICD-10-CM | POA: Insufficient documentation

## 2020-08-26 DIAGNOSIS — R4589 Other symptoms and signs involving emotional state: Secondary | ICD-10-CM | POA: Diagnosis present

## 2020-08-26 DIAGNOSIS — G8918 Other acute postprocedural pain: Secondary | ICD-10-CM | POA: Diagnosis not present

## 2020-08-26 DIAGNOSIS — R45851 Suicidal ideations: Secondary | ICD-10-CM | POA: Diagnosis not present

## 2020-08-26 DIAGNOSIS — Z79899 Other long term (current) drug therapy: Secondary | ICD-10-CM | POA: Diagnosis not present

## 2020-08-26 DIAGNOSIS — Z87891 Personal history of nicotine dependence: Secondary | ICD-10-CM | POA: Diagnosis not present

## 2020-08-26 DIAGNOSIS — Z98891 History of uterine scar from previous surgery: Secondary | ICD-10-CM

## 2020-08-26 DIAGNOSIS — I10 Essential (primary) hypertension: Secondary | ICD-10-CM | POA: Diagnosis not present

## 2020-08-26 DIAGNOSIS — Z20822 Contact with and (suspected) exposure to covid-19: Secondary | ICD-10-CM | POA: Diagnosis not present

## 2020-08-26 DIAGNOSIS — F3164 Bipolar disorder, current episode mixed, severe, with psychotic features: Secondary | ICD-10-CM | POA: Diagnosis present

## 2020-08-26 DIAGNOSIS — F332 Major depressive disorder, recurrent severe without psychotic features: Secondary | ICD-10-CM | POA: Diagnosis present

## 2020-08-26 DIAGNOSIS — O99340 Other mental disorders complicating pregnancy, unspecified trimester: Secondary | ICD-10-CM | POA: Diagnosis present

## 2020-08-26 LAB — CBC WITH DIFFERENTIAL/PLATELET
Abs Immature Granulocytes: 0.09 10*3/uL — ABNORMAL HIGH (ref 0.00–0.07)
Basophils Absolute: 0 10*3/uL (ref 0.0–0.1)
Basophils Relative: 0 %
Eosinophils Absolute: 0.6 10*3/uL — ABNORMAL HIGH (ref 0.0–0.5)
Eosinophils Relative: 9 %
HCT: 32 % — ABNORMAL LOW (ref 36.0–46.0)
Hemoglobin: 10.7 g/dL — ABNORMAL LOW (ref 12.0–15.0)
Immature Granulocytes: 1 %
Lymphocytes Relative: 31 %
Lymphs Abs: 2 10*3/uL (ref 0.7–4.0)
MCH: 30.2 pg (ref 26.0–34.0)
MCHC: 33.4 g/dL (ref 30.0–36.0)
MCV: 90.4 fL (ref 80.0–100.0)
Monocytes Absolute: 0.4 10*3/uL (ref 0.1–1.0)
Monocytes Relative: 7 %
Neutro Abs: 3.3 10*3/uL (ref 1.7–7.7)
Neutrophils Relative %: 52 %
Platelets: 266 10*3/uL (ref 150–400)
RBC: 3.54 MIL/uL — ABNORMAL LOW (ref 3.87–5.11)
RDW: 13.1 % (ref 11.5–15.5)
WBC: 6.4 10*3/uL (ref 4.0–10.5)
nRBC: 0 % (ref 0.0–0.2)

## 2020-08-26 LAB — URINE DRUG SCREEN, QUALITATIVE (ARMC ONLY)
Amphetamines, Ur Screen: NOT DETECTED
Barbiturates, Ur Screen: NOT DETECTED
Benzodiazepine, Ur Scrn: NOT DETECTED
Cannabinoid 50 Ng, Ur ~~LOC~~: POSITIVE — AB
Cocaine Metabolite,Ur ~~LOC~~: POSITIVE — AB
MDMA (Ecstasy)Ur Screen: NOT DETECTED
Methadone Scn, Ur: NOT DETECTED
Opiate, Ur Screen: NOT DETECTED
Phencyclidine (PCP) Ur S: NOT DETECTED
Tricyclic, Ur Screen: POSITIVE — AB

## 2020-08-26 LAB — COMPREHENSIVE METABOLIC PANEL
ALT: 17 U/L (ref 0–44)
AST: 16 U/L (ref 15–41)
Albumin: 3 g/dL — ABNORMAL LOW (ref 3.5–5.0)
Alkaline Phosphatase: 93 U/L (ref 38–126)
Anion gap: 13 (ref 5–15)
BUN: 11 mg/dL (ref 6–20)
CO2: 20 mmol/L — ABNORMAL LOW (ref 22–32)
Calcium: 9.4 mg/dL (ref 8.9–10.3)
Chloride: 103 mmol/L (ref 98–111)
Creatinine, Ser: 0.62 mg/dL (ref 0.44–1.00)
GFR calc Af Amer: >60 mL/min (ref >60)
GFR calc non Af Amer: >60 mL/min (ref >60)
Glucose, Bld: 72 mg/dL (ref 70–99)
Potassium: 3.7 mmol/L (ref 3.5–5.1)
Sodium: 136 mmol/L (ref 135–145)
Total Bilirubin: 1.2 mg/dL (ref 0.3–1.2)
Total Protein: 7 g/dL (ref 6.5–8.1)

## 2020-08-26 LAB — URINALYSIS, COMPLETE (UACMP) WITH MICROSCOPIC
Bacteria, UA: NONE SEEN
Bilirubin Urine: NEGATIVE
Glucose, UA: NEGATIVE mg/dL
Ketones, ur: 5 mg/dL — AB
Nitrite: NEGATIVE
Protein, ur: NEGATIVE mg/dL
Specific Gravity, Urine: 1.03 (ref 1.005–1.030)
pH: 6 (ref 5.0–8.0)

## 2020-08-26 LAB — RESPIRATORY PANEL BY RT PCR (FLU A&B, COVID)
Influenza A by PCR: NEGATIVE
Influenza B by PCR: NEGATIVE
SARS Coronavirus 2 by RT PCR: NEGATIVE

## 2020-08-26 MED ORDER — ACETAMINOPHEN 500 MG PO TABS
1000.0000 mg | ORAL_TABLET | Freq: Once | ORAL | Status: AC
  Administered 2020-08-26: 1000 mg via ORAL
  Filled 2020-08-26: qty 2

## 2020-08-26 MED ORDER — TRAMADOL HCL 50 MG PO TABS
50.0000 mg | ORAL_TABLET | Freq: Four times a day (QID) | ORAL | 0 refills | Status: DC | PRN
Start: 2020-08-26 — End: 2021-10-30

## 2020-08-26 MED ORDER — TRAMADOL HCL 50 MG PO TABS
50.0000 mg | ORAL_TABLET | Freq: Once | ORAL | Status: AC
  Administered 2020-08-26: 50 mg via ORAL
  Filled 2020-08-26: qty 1

## 2020-08-26 NOTE — ED Notes (Signed)
Hourly rounding reveals patient in room. No complaints, stable, in no acute distress. Q15 minute rounds and monitoring via Rover and Officer to continue.   

## 2020-08-26 NOTE — ED Notes (Signed)
VOL, pend psych consult 

## 2020-08-26 NOTE — ED Notes (Signed)
Report to include Situation, Background, Assessment, and Recommendations received from Jadeka RN. Patient alert and oriented, warm and dry, in no acute distress. Patient denies SI, HI, AVH and pain. Patient made aware of Q15 minute rounds and Rover and Officer presence for their safety. Patient instructed to come to me with needs or concerns.  

## 2020-08-26 NOTE — ED Triage Notes (Signed)
Presents via EMS  With possible post-op problem  States she had a C-section 08/27   Feels like she pulled or tore something    Having pain to both sides of abd

## 2020-08-26 NOTE — Consult Note (Signed)
[facility] Face-to-Face Psychiatry Consult   Reason for Consult:Post-op Problem Referring Physician: Dr. Cyril Loosen Patient Identification: [name] MRN:  [id number] Principal Diagnosis: <principal problem not specified> Diagnosis:  Active Problems:   Thoughts of self harm   MDD (major depressive disorder), recurrent episode, severe (HCC)   Bipolar disorder, curr episode mixed, severe, with psychotic features (HCC)   History of cesarean delivery   Severe recurrent major depression without psychotic features (HCC)   Major depressive disorder, recurrent severe without psychotic features (HCC)   MDD (major depressive disorder), recurrent severe, without psychosis (HCC)   Bipolar disease in pregnancy, unspecified trimester (HCC)   Total Time spent with patient: 30 minutes  Subjective:  "I feel worthless. I had my baby and they took my baby away from me." [name] is a 34 y.o. female patient presented to [facility] ED via EMS voluntarily. The patient has had several ER visits during the past couple of months for her mental and physical health. The patient was also pregnant during those many visits. Per the ED triage nurse note, the initial patient assessment was due to her complaints of a possible post-op problem. The patient states she had a C-section on 08/27  and feels like she pulled or tore something. She complains of having pain in both sides of her abdominal area. The patient then reported she is depressed and began crying. The patient states her baby was taken from her and is in foster care. The patient lost her apartment recently. The patient is requesting help.  The patient was seen face-to-face by this provider; the chart was reviewed and consulted with Dr. Derrill Kay on 08/26/2020 due to the patient's care. It was discussed with the EDP that the patient is a safety risk to herself and requires psychiatric inpatient admission for stabilization and treatment. The patient was positive for  Cocaine and Cannabinoid.  The patient is alert and oriented x 4, anxious, cooperative, and mood-congruent with effect on evaluation. The patient does not appear to be responding to internal or external stimuli. The patient is not presenting with delusional thinking. The patient denies auditory or visual hallucinations. The patient is assessed for suicidal ideation, and she voiced, "I have lost everything. I feel like what is the point of living." She denies self-harm and homicidal ideations. The patient is not presenting with any psychotic or paranoid behaviors. During an encounter with the patient, she was able to answer questions appropriately. The patient voiced that she feels sad, worthless, and feels what the point in living.  Plan: The patient is a safety risk and requires psychiatric inpatient admission for stabilization and treatment.  HPI:  Per Dr. Harvest Forest NP: [name] is a 34 y.o. female with history of c-section on 08/20/20 who presents to the emergency department for treatment and evaluation of pain at her incision site. Pain has gradually increased since she was discharged home. Pain increases with position change. She denies complication at delivery. She has not noticed bleeding or discharge from the site. She denies fever or other concerns. No alleviating measures prior to arrival.  Past Psychiatric History:  Anxiety Depression History of substance abuse (HCC) History of suicide attempt  Risk to Self: Suicidal Ideation: Yes-Currently Present Suicidal Intent: Yes-Currently Present Is patient at risk for suicide?: Yes Suicidal Plan?: No Specify Current Suicidal Plan: Patient did not identify a plan Access to Means:  (Unknown) What has been your use of drugs/alcohol within the last 12 months?: Cocaine, Meth, Alcohol, Cannabis How  many times?: 1 Other Self Harm Risks: Active Drug Use Triggers for Past Attempts: Other (Comment) (Losing her children) Intentional Self  Injurious Behavior: NoneNo Risk to Others: Homicidal Ideation: No Thoughts of Harm to Others: No Current Homicidal Intent: No Current Homicidal Plan: No Access to Homicidal Means: No Identified Victim: None History of harm to others?: No Assessment of Violence: None Noted Violent Behavior Description: None Does patient have access to weapons?: No Criminal Charges Pending?: No Does patient have a court date: NoNo Prior Inpatient Therapy: Prior Inpatient Therapy: Yes Prior Therapy Dates: Multiple Hospitalizations Prior Therapy Facilty/Provider(s): [facility], [facility] Reason for Treatment: Depression and  anxietyYes Prior Outpatient Therapy: Prior Outpatient Therapy: No Does patient have an ACCT team?: No Does patient have Intensive In-House Services?  : No Does patient have [facility] services? : No Does patient have P4CC services?: NoYes  Past Medical History:  Past Medical History:  Diagnosis Date  . Anxiety   . Depression   . Gestational diabetes   . History of substance abuse (HCC)   . History of suicide attempt   . History of thyroid disease   . Hypertension   . Thyroid disease     Past Surgical History:  Procedure Laterality Date  . CESAREAN SECTION  2000, 2002, 2013  . WISDOM TOOTH EXTRACTION     Family History:  Family History  Problem Relation Age of Onset  . Diabetes Mother    Family Psychiatric  History:  Social History:  Social History   Substance and Sexual Activity  Alcohol Use Not Currently   Comment: social drinker     Social History   Substance and Sexual Activity  Drug Use Not Currently  . Types: Cocaine, Marijuana   Comment: Positive UDS 12/2017    Social History   Socioeconomic History  . Marital status: Single    Spouse name: Not on file  . Number of children: Not on file  . Years of education: Not on file  . Highest education level: Not on file  Occupational History  . Not on file  Tobacco Use  . Smoking status: Former  Smoker    Packs/day: 0.50  . Smokeless tobacco: Never Used  Substance and Sexual Activity  . Alcohol use: Not Currently    Comment: social drinker  . Drug use: Not Currently    Types: Cocaine, Marijuana    Comment: Positive UDS 12/2017  . Sexual activity: Not Currently    Birth control/protection: None  Other Topics Concern  . Not on file  Social History Narrative  . Not on file   Social Determinants of Health   Financial Resource Strain:   . Difficulty of Paying Living Expenses: Not on file  Food Insecurity:   . Worried About Programme researcher, broadcasting/film/videounning Out of Food in the Last Year: Not on file  . Ran Out of Food in the Last Year: Not on file  Transportation Needs:   . Lack of Transportation (Medical): Not on file  . Lack of Transportation (Non-Medical): Not on file  Physical Activity:   . Days of Exercise per Week: Not on file  . Minutes of Exercise per Session: Not on file  Stress:   . Feeling of Stress : Not on file  Social Connections:   . Frequency of Communication with Friends and Family: Not on file  . Frequency of Social Gatherings with Friends and Family: Not on file  . Attends Religious Services: Not on file  . Active Member of Clubs or  Organizations: Not on file  . Attends Banker Meetings: Not on file  . Marital Status: Not on file   Additional Social History:    Allergies:   Allergies  Allergen Reactions  . Aspirin Anaphylaxis, Diarrhea and Nausea And Vomiting  . Banana Anaphylaxis, Itching, Shortness Of Breath and Swelling  . Other Itching, Shortness Of Breath and Swelling  . Peanut Oil Itching, Shortness Of Breath and Swelling  . Peanut-Containing Drug Products Shortness Of Breath, Itching and Swelling  . Pecan Nut (Diagnostic) Anaphylaxis    Labs:  Results for orders placed or performed during the hospital encounter of 08/26/20 (from the past 48 hour(s))  CBC with Differential     Status: Abnormal   Collection Time: 08/26/20  7:34 AM  Result Value Ref  Range   WBC 6.4 4.0 - 10.5 K/uL   RBC 3.54 (L) 3.87 - 5.11 MIL/uL   Hemoglobin 10.7 (L) 12.0 - 15.0 g/dL   HCT 99.3 (L) 36 - 46 %   MCV 90.4 80.0 - 100.0 fL   MCH 30.2 26.0 - 34.0 pg   MCHC 33.4 30.0 - 36.0 g/dL   RDW 71.6 96.7 - 89.3 %   Platelets 266 150 - 400 K/uL   nRBC 0.0 0.0 - 0.2 %   Neutrophils Relative % 52 %   Neutro Abs 3.3 1.7 - 7.7 K/uL   Lymphocytes Relative 31 %   Lymphs Abs 2.0 0.7 - 4.0 K/uL   Monocytes Relative 7 %   Monocytes Absolute 0.4 0 - 1 K/uL   Eosinophils Relative 9 %   Eosinophils Absolute 0.6 (H) 0 - 0 K/uL   Basophils Relative 0 %   Basophils Absolute 0.0 0 - 0 K/uL   Immature Granulocytes 1 %   Abs Immature Granulocytes 0.09 (H) 0.00 - 0.07 K/uL    Comment: Performed at [facility], [address], [place], [place] [address]  Comprehensive metabolic panel     Status: Abnormal   Collection Time: 08/26/20  7:34 AM  Result Value Ref Range   Sodium 136 135 - 145 mmol/L   Potassium 3.7 3.5 - 5.1 mmol/L   Chloride 103 98 - 111 mmol/L   CO2 20 (L) 22 - 32 mmol/L   Glucose, Bld 72 70 - 99 mg/dL    Comment: Glucose reference range applies only to samples taken after fasting for at least 8 hours.   BUN 11 6 - 20 mg/dL   Creatinine, Ser 5.10 0.44 - 1.00 mg/dL   Calcium 9.4 8.9 - 25.8 mg/dL   Total Protein 7.0 6.5 - 8.1 g/dL   Albumin 3.0 (L) 3.5 - 5.0 g/dL   AST 16 15 - 41 U/L   ALT 17 0 - 44 U/L   Alkaline Phosphatase 93 38 - 126 U/L   Total Bilirubin 1.2 0.3 - 1.2 mg/dL   GFR calc non Af Amer >60 >60 mL/min   GFR calc Af Amer >60 >60 mL/min   Anion gap 13 5 - 15    Comment: Performed at [facility], [address] [place], [place] [address]  Urinalysis, Complete w Microscopic Urine, Clean Catch     Status: Abnormal   Collection Time: 08/26/20  9:51 AM  Result Value Ref Range   Color, Urine YELLOW (A) YELLOW   APPearance HAZY (A) CLEAR   Specific Gravity, Urine 1.030 1.005 - 1.030   pH 6.0 5.0 - 8.0   Glucose, UA  NEGATIVE NEGATIVE mg/dL  Hgb urine dipstick MODERATE (A) NEGATIVE   Bilirubin Urine NEGATIVE NEGATIVE   Ketones, ur 5 (A) NEGATIVE mg/dL   Protein, ur NEGATIVE NEGATIVE mg/dL   Nitrite NEGATIVE NEGATIVE   Leukocytes,Ua TRACE (A) NEGATIVE   RBC / HPF 21-50 0 - 5 RBC/hpf   WBC, UA 0-5 0 - 5 WBC/hpf   Bacteria, UA NONE SEEN NONE SEEN   Squamous Epithelial / LPF 0-5 0 - 5   Mucus PRESENT     Comment: Performed at [facility], [address], [place], [place] [address]  Urine Drug Screen, Qualitative ([facility] only)     Status: Abnormal   Collection Time: 08/26/20  9:51 AM  Result Value Ref Range   Tricyclic, Ur Screen POSITIVE (A) NONE DETECTED   Amphetamines, Ur Screen NONE DETECTED NONE DETECTED   MDMA (Ecstasy)Ur Screen NONE DETECTED NONE DETECTED   Cocaine Metabolite,Ur [place] POSITIVE (A) NONE DETECTED   Opiate, Ur Screen NONE DETECTED NONE DETECTED   Phencyclidine (PCP) Ur S NONE DETECTED NONE DETECTED   Cannabinoid 50 Ng, Ur [place] POSITIVE (A) NONE DETECTED   Barbiturates, Ur Screen NONE DETECTED NONE DETECTED   Benzodiazepine, Ur Scrn NONE DETECTED NONE DETECTED   Methadone Scn, Ur NONE DETECTED NONE DETECTED    Comment: (NOTE) Tricyclics + metabolites, urine    Cutoff 1000 ng/mL Amphetamines + metabolites, urine  Cutoff 1000 ng/mL MDMA (Ecstasy), urine              Cutoff 500 ng/mL Cocaine Metabolite, urine          Cutoff 300 ng/mL Opiate + metabolites, urine        Cutoff 300 ng/mL Phencyclidine (PCP), urine         Cutoff 25 ng/mL Cannabinoid, urine                 Cutoff 50 ng/mL Barbiturates + metabolites, urine  Cutoff 200 ng/mL Benzodiazepine, urine              Cutoff 200 ng/mL Methadone, urine                   Cutoff 300 ng/mL  The urine drug screen provides only a preliminary, unconfirmed analytical test result and should not be used for non-medical purposes. Clinical consideration and professional judgment should be applied to any positive drug screen result  due to possible interfering substances. A more specific alternate chemical method must be used in order to obtain a confirmed analytical result. Gas chromatography / mass spectrometry (GC/MS) is the preferred confirm atory method. Performed at [facility], [address] [place], [place] [address]   Respiratory Panel by RT PCR (Flu A&B, Covid) - Nasopharyngeal Swab     Status: None   Collection Time: 08/26/20  8:28 PM   Specimen: Nasopharyngeal Swab  Result Value Ref Range   SARS Coronavirus 2 by RT PCR NEGATIVE NEGATIVE    Comment: (NOTE) SARS-CoV-2 target nucleic acids are NOT DETECTED.  The SARS-CoV-2 RNA is generally detectable in upper respiratoy specimens during the acute phase of infection. The lowest concentration of SARS-CoV-2 viral copies this assay can detect is 131 copies/mL. A negative result does not preclude SARS-Cov-2 infection and should not be used as the sole basis for treatment or other patient management decisions. A negative result may occur with  improper specimen collection/handling, submission of specimen other than nasopharyngeal swab, presence of viral mutation(s) within the areas targeted by this assay, and inadequate number of viral copies (<  131 copies/mL). A negative result must be combined with clinical observations, patient history, and epidemiological information. The expected result is Negative.  Fact Sheet for Patients:  https://www.moore.com/  Fact Sheet for Healthcare Providers:  https://www.young.biz/  This test is no t yet approved or cleared by the [place] FDA and  has been authorized for detection and/or diagnosis of SARS-CoV-2 by FDA under an Emergency Use Authorization (EUA). This EUA will remain  in effect (meaning this test can be used) for the duration of the COVID-19 declaration under Section 564(b)(1) of the Act, 21 U.S.C. section 360bbb-3(b)(1), unless the authorization is  terminated or revoked sooner.     Influenza A by PCR NEGATIVE NEGATIVE   Influenza B by PCR NEGATIVE NEGATIVE    Comment: (NOTE) The Xpert Xpress SARS-CoV-2/FLU/RSV assay is intended as an aid in  the diagnosis of influenza from Nasopharyngeal swab specimens and  should not be used as a sole basis for treatment. Nasal washings and  aspirates are unacceptable for Xpert Xpress SARS-CoV-2/FLU/RSV  testing.  Fact Sheet for Patients: https://www.moore.com/  Fact Sheet for Healthcare Providers: https://www.young.biz/  This test is not yet approved or cleared by the [place] FDA and  has been authorized for detection and/or diagnosis of SARS-CoV-2 by  FDA under an Emergency Use Authorization (EUA). This EUA will remain  in effect (meaning this test can be used) for the duration of the  Covid-19 declaration under Section 564(b)(1) of the Act, 21  U.S.C. section 360bbb-3(b)(1), unless the authorization is  terminated or revoked. Performed at [facility], [address] [place], [place] [address]     No current facility-administered medications for this encounter.   Current Outpatient Medications  Medication Sig Dispense Refill  . ARIPiprazole (ABILIFY) 5 MG tablet Take 1 tablet (5 mg total) by mouth at bedtime.    . ARIPiprazole ER (ABILIFY MAINTENA) 400 MG SRER injection Inject 2 mLs (400 mg total) into the muscle once for 1 dose. 2 mL 0  . Prenatal Vit-Fe Fumarate-FA (PRENATAL MULTIVITAMIN) TABS tablet Take 1 tablet by mouth daily at 12 noon.    . sertraline (ZOLOFT) 50 MG tablet Take 50 mg by mouth daily.    . traMADol (ULTRAM) 50 MG tablet Take 1 tablet (50 mg total) by mouth every 6 (six) hours as needed. 12 tablet 0  . traZODone (DESYREL) 50 MG tablet Take 1 tablet (50 mg total) by mouth at bedtime.      Musculoskeletal: Strength & Muscle Tone: within normal limits Gait & Station: normal Patient leans:  N/A  Psychiatric Specialty Exam: Physical Exam Vitals and nursing note reviewed.  Constitutional:      Appearance: Normal appearance.  Cardiovascular:     Rate and Rhythm: Normal rate.  Pulmonary:     Effort: Pulmonary effort is normal.  Musculoskeletal:        General: Normal range of motion.     Cervical back: Normal range of motion and neck supple.  Neurological:     Mental Status: She is alert and oriented to person, place, and time.  Psychiatric:        Attention and Perception: Attention and perception normal.        Mood and Affect: Affect is flat and angry.        Speech: Speech normal.        Behavior: Behavior is uncooperative and aggressive.        Thought Content: Thought content normal.  Cognition and Memory: Cognition normal.        Judgment: Judgment normal.     Review of Systems  Psychiatric/Behavioral: Positive for agitation. The patient is nervous/anxious.   All other systems reviewed and are negative.   Blood pressure (!) 144/74, pulse 80, temperature 98 F (36.7 C), temperature source Oral, resp. rate 18, height  (1.499 m), weight 75.8 kg, last menstrual period 12/04/2019, SpO2 100 %, not currently breastfeeding.Body mass index is 33.73 kg/m.  General Appearance: Casual  Eye Contact:  Poor  Speech:  Clear and Coherent  Volume:  Decreased  Mood:  Depressed, Hopeless and Worthless  Affect:  Appropriate, Blunt, Congruent, Depressed and Flat  Thought Process:  Coherent  Orientation:  Full (Time, Place, and Person)  Thought Content:  WDL and Logical  Suicidal Thoughts:  I don't want to be around  Homicidal Thoughts:  No  Memory:  Immediate;   Good Recent;   Good Remote;   Good  Judgement:  Fair  Insight:  Lacking  Psychomotor Activity:  Normal  Concentration:  Concentration: Fair and Attention Span: Fair  Recall:  [employer] of Knowledge:  Fair  Language:  Fair  Akathisia:  Negative  Handed:  Right  AIMS (if indicated):     Assets:   Communication Skills Desire for Improvement Financial Resources/Insurance Physical Health Resilience Social Support  ADL's:  Intact  Cognition:  WNL  Sleep:   Okay     Treatment Plan Summary: Plan The patient is a safety risk to self and requires psychiatric inpatient admission for stabilization and treatment.  Disposition: Supportive therapy provided about ongoing stressors. The patient is a safety risk to self and requires psychiatric inpatient admission for stabilization and treatment.  Gillermo Murdoch, NP 08/26/2020 11:12 PM

## 2020-08-26 NOTE — ED Notes (Signed)
Wallace Cullens teeshirt Black sweat pants Green jacket  brown pocketbook Cell phone Wallace Cullens flip flops Black cap

## 2020-08-26 NOTE — ED Notes (Signed)
Pt reports she is depressed and crying  Pt states her baby was taken from her and is in foster care.  Pt lost her apartment recently.  Pt requesting help.  Dr Cyril Loosen and [facility]tt fnp aware.  Pt moved to room 20.  Pt dressed out in room 49.  Pt calm and cooperative at this time.

## 2020-08-26 NOTE — ED Notes (Signed)
Report off to jadeka rn  

## 2020-08-26 NOTE — BH Assessment (Signed)
Assessment Note  [name] is an 34 y.o. female presenting to [facility] ED voluntarily for depression. Per triage note Pt reports she is depressed and crying  Pt states her baby was taken from her and is in foster care.  Pt lost her apartment recently.  Pt requesting help. During assessment patient appears alert and oriented x4, cooperative, depressed and sad. When asked why patient was presenting to [facility] ED she reported "I feel hopeless and worthless, they took my baby away from me, and I need help." Patient reported SI "there is no need to live or even try to live" patient does not report a plan at this time. Patient denies HI/AH/VH. Patient has a history of substance use but denies using any substances today. Patient UDS positive for Tricyclic, Cocaine, Cannabinoids. Patient reports a previous hospitalization at [facility].  Per Psyc NP Elenore Paddy patient is recommended for Inpatient Hospitalization  Diagnosis: Depression, Polysubstance Abuse  Past Medical History:  Past Medical History:  Diagnosis Date  . Anxiety   . Depression   . Gestational diabetes   . History of substance abuse (HCC)   . History of suicide attempt   . History of thyroid disease   . Hypertension   . Thyroid disease     Past Surgical History:  Procedure Laterality Date  . CESAREAN SECTION  2000, 2002, 2013  . WISDOM TOOTH EXTRACTION      Family History:  Family History  Problem Relation Age of Onset  . Diabetes Mother     Social History:  reports that she has quit smoking. She smoked 0.50 packs per day. She has never used smokeless tobacco. She reports previous alcohol use. She reports previous drug use. Drugs: Cocaine and Marijuana.  Additional Social History:  Alcohol / Drug Use Pain Medications: See MAR Prescriptions: See MAR Over the Counter: See MAR History of alcohol / drug use?: Yes Substance #1 Name of Substance 1: Alcohol Substance #2 Name of Substance 2: Cocaine  CIWA: CIWA-Ar BP: (!)  144/74 Pulse Rate: 80 COWS:    Allergies:  Allergies  Allergen Reactions  . Aspirin Anaphylaxis, Diarrhea and Nausea And Vomiting  . Banana Anaphylaxis, Itching, Shortness Of Breath and Swelling  . Other Itching, Shortness Of Breath and Swelling  . Peanut Oil Itching, Shortness Of Breath and Swelling  . Peanut-Containing Drug Products Shortness Of Breath, Itching and Swelling  . Pecan Nut (Diagnostic) Anaphylaxis    Home Medications: (Not in a hospital admission)   OB/GYN Status:  Patient's last menstrual period was 12/04/2019 (within months).  General Assessment Data Location of Assessment: [facility] ED TTS Assessment: In system Is this a Tele or Face-to-Face Assessment?: Face-to-Face Is this an Initial Assessment or a Re-assessment for this encounter?: Initial Assessment Patient Accompanied by:: N/A Language Other than English: No Living Arrangements: Homeless/Shelter What gender do you identify as?: Female Marital status: Single Pregnancy Status: No Living Arrangements: Other (Comment) Can pt return to current living arrangement?: Yes Admission Status: Voluntary Is patient capable of signing voluntary admission?: Yes Referral Source: Self/Family/Friend Insurance type: Medicaid  Medical Screening Exam [facility] Walk-in ONLY) Medical Exam completed: Yes  Crisis Care Plan Living Arrangements: Other (Comment) Legal Guardian: Other: (Self) Name of Psychiatrist: None Name of Therapist: None  Education Status Is patient currently in school?: No Is the patient employed, unemployed or receiving disability?: Unemployed  Risk to self with the past 6 months Suicidal Ideation: Yes-Currently Present Has patient been a risk to self within the past 6 months  prior to admission? : Yes Suicidal Intent: Yes-Currently Present Has patient had any suicidal intent within the past 6 months prior to admission? : Yes Is patient at risk for suicide?: Yes Suicidal Plan?: No Has patient had any  suicidal plan within the past 6 months prior to admission? : No Specify Current Suicidal Plan: Patient did not identify a plan Access to Means:  (Unknown) What has been your use of drugs/alcohol within the last 12 months?: Cocaine, Meth, Alcohol, Cannabis Previous Attempts/Gestures: Yes How many times?: 1 Other Self Harm Risks: Active Drug Use Triggers for Past Attempts: Other (Comment) (Losing her children) Intentional Self Injurious Behavior: None Family Suicide History: Unknown Recent stressful life event(s): Other (Comment), Trauma (Comment), Financial Problems (Losing her children, ) Persecutory voices/beliefs?: No Depression: Yes Depression Symptoms: Tearfulness, Isolating, Loss of interest in usual pleasures, Feeling worthless/self pity, Fatigue Substance abuse history and/or treatment for substance abuse?: Yes Suicide prevention information given to non-admitted patients: Not applicable  Risk to Others within the past 6 months Homicidal Ideation: No Does patient have any lifetime risk of violence toward others beyond the six months prior to admission? : No Thoughts of Harm to Others: No Current Homicidal Intent: No Current Homicidal Plan: No Access to Homicidal Means: No Identified Victim: None History of harm to others?: No Assessment of Violence: None Noted Violent Behavior Description: None Does patient have access to weapons?: No Criminal Charges Pending?: No Does patient have a court date: No Is patient on probation?: No  Psychosis Hallucinations: None noted Delusions: None noted  Mental Status Report Appearance/Hygiene: In scrubs Eye Contact: Fair Motor Activity: Freedom of movement Speech: Logical/coherent Level of Consciousness: Alert Mood: Depressed, Sad Affect: Appropriate to circumstance, Depressed, Sad Anxiety Level: Minimal Thought Processes: Coherent Judgement: Unimpaired Orientation: Person, Place, Time, Situation, Appropriate for developmental  age Obsessive Compulsive Thoughts/Behaviors: None  Cognitive Functioning Concentration: Normal Memory: Recent Intact, Remote Intact Is patient IDD: No Insight: Fair Impulse Control: Fair Appetite: Poor Have you had any weight changes? : No Change Sleep: Decreased Total Hours of Sleep: 0 Vegetative Symptoms: None  ADLScreening [facility] Assessment Services) Patient's cognitive ability adequate to safely complete daily activities?: Yes Patient able to express need for assistance with ADLs?: Yes Independently performs ADLs?: Yes (appropriate for developmental age)  Prior Inpatient Therapy Prior Inpatient Therapy: Yes Prior Therapy Dates: Multiple Hospitalizations Prior Therapy Facilty/Provider(s): [facility], [facility] Reason for Treatment: Depression and  anxiety  Prior Outpatient Therapy Prior Outpatient Therapy: No Does patient have an ACCT team?: No Does patient have Intensive In-House Services?  : No Does patient have [facility] services? : No Does patient have P4CC services?: No  ADL Screening (condition at time of admission) Patient's cognitive ability adequate to safely complete daily activities?: Yes Is the patient deaf or have difficulty hearing?: No Does the patient have difficulty seeing, even when wearing glasses/contacts?: No Does the patient have difficulty concentrating, remembering, or making decisions?: No Patient able to express need for assistance with ADLs?: Yes Does the patient have difficulty dressing or bathing?: No Independently performs ADLs?: Yes (appropriate for developmental age) Does the patient have difficulty walking or climbing stairs?: No Weakness of Legs: None Weakness of Arms/Hands: None  Home Assistive Devices/Equipment Home Assistive Devices/Equipment: None  Therapy Consults (therapy consults require a physician order) PT Evaluation Needed: No OT Evalulation Needed: No SLP Evaluation Needed: No Abuse/Neglect Assessment (Assessment to  be complete while patient is alone) Abuse/Neglect Assessment Can Be Completed: Yes Physical Abuse: Denies Verbal Abuse:  Denies Sexual Abuse: Denies Exploitation of patient/patient's resources: Denies Self-Neglect: Denies Values / Beliefs Cultural Requests During Hospitalization: None Spiritual Requests During Hospitalization: None Consults Spiritual Care Consult Needed: No Transition of Care Team Consult Needed: No Advance Directives (For Healthcare) Does Patient Have a Medical Advance Directive?: No Would patient like information on creating a medical advance directive?: No - Patient declined          Disposition: Per Psyc NP Elenore Paddy patient is recommended for Inpatient Hospitalization Disposition Initial Assessment Completed for this Encounter: Yes  On Site Evaluation by:   Reviewed with Physician:    Benay Pike MS LCASA 08/26/2020 10:25 PM

## 2020-08-26 NOTE — ED Provider Notes (Addendum)
[facility] Emergency Department Provider Note ____________________________________________   First MD Initiated Contact with Patient 08/26/20 1625     (approximate)  I have reviewed the triage vital signs and the nursing notes.   HISTORY  Chief Complaint Post-op Problem  HPI [name] is a 34 y.o. female with history of c-section on 08/20/20 who presents to the emergency department for treatment and evaluation of pain at her incision site. Pain has gradually increased since she was discharged home. Pain increases with position change. She denies complication at delivery. She has not noticed bleeding or discharge from the site. She denies fever or other concerns. No alleviating measures prior to arrival.         Past Medical History:  Diagnosis Date  . Anxiety   . Depression   . Gestational diabetes   . History of substance abuse (HCC)   . History of suicide attempt   . History of thyroid disease   . Hypertension   . Thyroid disease     Patient Active Problem List   Diagnosis Date Noted  . Intrauterine growth restriction, antepartum, third trimester, not applicable or unspecified fetus   . Supervision of high risk pregnancy, antepartum 07/23/2020  . Substance abuse complicating pregnancy, antepartum 07/23/2020  . Bipolar disease in pregnancy, unspecified trimester (HCC) 07/23/2020  . Genital herpes affecting pregnancy 07/23/2020  . Abnormal glucose tolerance test in pregnancy 07/23/2020  . High risk pregnancy due to history of preterm labor, antepartum 07/23/2020  . Chronic hypertension during pregnancy, antepartum 07/23/2020  . History of prior pregnancy with IUGR newborn 07/23/2020  . Asthma affecting pregnancy, antepartum 07/23/2020  . History of fetal anomaly in prior pregnancy, currently pregnant 07/23/2020  . Diet controlled gestational diabetes mellitus (GDM) in third trimester   . MDD (major depressive disorder), recurrent  severe, without psychosis (HCC) 07/18/2020  . Abdominal pain in pregnancy, third trimester 07/17/2020  . Major depressive disorder, recurrent severe without psychotic features (HCC) 07/15/2020  . Cramping complicating pregnancy, antepartum 06/14/2020  . Severe recurrent major depression without psychotic features (HCC) 06/03/2020  . Fall 05/28/2020  . History of cesarean delivery 05/28/2020  . Bipolar disorder, curr episode mixed, severe, with psychotic features (HCC)   . MDD (major depressive disorder), recurrent episode, severe (HCC) 04/08/2020  . Thoughts of self harm 02/01/2020  . Abdominal pain 03/20/2018  . Cocaine use disorder, moderate, dependence (HCC) 01/01/2018  . Cannabis use disorder, moderate, dependence (HCC) 01/01/2018  . Pregnant 12/31/2017  . Medically noncompliant 12/31/2017  . Tobacco use disorder 01/12/2016  . Stimulant use disorder (HCC) (cocaine) 01/12/2016  . Alcohol use disorder, moderate, dependence (HCC) 01/12/2016  . Self-inflicted laceration of wrist (HCC) 01/11/2016    Past Surgical History:  Procedure Laterality Date  . CESAREAN SECTION  2000, 2002, 2013  . WISDOM TOOTH EXTRACTION      Prior to Admission medications   Medication Sig Start Date End Date Taking? Authorizing Provider  ARIPiprazole (ABILIFY) 5 MG tablet Take 1 tablet (5 mg total) by mouth at bedtime. 07/29/20   Clapacs, Jackquline Denmark, MD  ARIPiprazole ER (ABILIFY MAINTENA) 400 MG SRER injection Inject 2 mLs (400 mg total) into the muscle once for 1 dose. 07/29/20 07/29/20  Clapacs, Jackquline Denmark, MD  Prenatal Vit-Fe Fumarate-FA (PRENATAL MULTIVITAMIN) TABS tablet Take 1 tablet by mouth daily at 12 noon.    [provider]  sertraline (ZOLOFT) 50 MG tablet Take 50 mg by mouth daily.    [provider]  traMADol (ULTRAM) 50 MG tablet Take 1 tablet (50 mg total) by mouth every 6 (six) hours as needed. 08/26/20   Dontarious Schaum, Rulon Eisenmenger B, FNP  traZODone (DESYREL) 50 MG tablet Take 1 tablet (50 mg total)  by mouth at bedtime. 07/29/20   Clapacs, Jackquline Denmark, MD    Allergies Aspirin, Banana, Other, Peanut oil, Peanut-containing drug products, and Pecan nut (diagnostic)  Family History  Problem Relation Age of Onset  . Diabetes Mother     Social History Social History   Tobacco Use  . Smoking status: Former Smoker    Packs/day: 0.50  . Smokeless tobacco: Never Used  Substance Use Topics  . Alcohol use: Not Currently    Comment: social drinker  . Drug use: Not Currently    Types: Cocaine, Marijuana    Comment: Positive UDS 12/2017    Review of Systems  Constitutional: No fever/chills Eyes: No visual changes. ENT: No sore throat. Cardiovascular: Denies chest pain. Respiratory: Denies shortness of breath. Gastrointestinal: No abdominal pain.  No nausea, no vomiting.  No diarrhea.  No constipation. Genitourinary: Negative for dysuria. Musculoskeletal: Negative for back pain. Skin: Positive for pain at c-section site. Neurological: Negative for headaches, focal weakness or numbness. ____________________________________________   PHYSICAL EXAM:  VITAL SIGNS: ED Triage Vitals  Enc Vitals Group     BP 08/26/20 0728 (!) 158/88     Pulse Rate 08/26/20 0728 83     Resp 08/26/20 0728 18     Temp 08/26/20 0728 98.2 F (36.8 C)     Temp Source 08/26/20 0728 Oral     SpO2 08/26/20 0728 100 %     Weight 08/26/20 0723 167 lb (75.8 kg)     Height 08/26/20 0723 4\' 11"  (1.499 m)     Head Circumference --      Peak Flow --      Pain Score 08/26/20 0723 8     Pain Loc --      Pain Edu? --      Excl. in GC? --     Constitutional: Alert and oriented. Well appearing and in no acute distress. Eyes: Conjunctivae are normal.  Head: Atraumatic. Nose: No congestion/rhinnorhea. Mouth/Throat: Mucous membranes are moist.  Oropharynx non-erythematous. Neck: No stridor.   Hematological/Lymphatic/Immunilogical: No cervical lymphadenopathy. Cardiovascular: Normal rate, regular rhythm. Grossly  normal heart sounds.  Good peripheral circulation. Respiratory: Normal respiratory effort.  No retractions. Lungs CTAB. Gastrointestinal: Soft and nontender. No distention. No abdominal bruits. Genitourinary:  Musculoskeletal: No lower extremity tenderness nor edema.  No joint effusions. Neurologic:  Normal speech and language. No gross focal neurologic deficits are appreciated. No gait instability. Skin:  Skin is warm, dry and intact. Surgical incision well approximated. No drainage or surrounding erythema.  Psychiatric: Mood and affect are normal. Speech and behavior are normal.  ____________________________________________   LABS (all labs ordered are listed, but only abnormal results are displayed)  Labs Reviewed  CBC WITH DIFFERENTIAL/PLATELET - Abnormal; Notable for the following components:      Result Value   RBC 3.54 (*)    Hemoglobin 10.7 (*)    HCT 32.0 (*)    Eosinophils Absolute 0.6 (*)    Abs Immature Granulocytes 0.09 (*)    All other components within normal limits  COMPREHENSIVE METABOLIC PANEL - Abnormal; Notable for the following components:   CO2 20 (*)    Albumin 3.0 (*)    All other components within normal limits  URINALYSIS, COMPLETE (UACMP) WITH MICROSCOPIC - Abnormal; Notable  for the following components:   Color, Urine YELLOW (*)    APPearance HAZY (*)    Hgb urine dipstick MODERATE (*)    Ketones, ur 5 (*)    Leukocytes,Ua TRACE (*)    All other components within normal limits  URINE DRUG SCREEN, QUALITATIVE ([facility] ONLY) - Abnormal; Notable for the following components:   Tricyclic, Ur Screen POSITIVE (*)    Cocaine Metabolite,Ur [place] POSITIVE (*)    Cannabinoid 50 Ng, Ur [place] POSITIVE (*)    All other components within normal limits  RESPIRATORY PANEL BY RT PCR (FLU A&B, COVID)   ____________________________________________  EKG  Not indicated. ____________________________________________  RADIOLOGY  ED MD interpretation:    Not  indicated.  I, Kem Boroughs, personally viewed and evaluated these images (plain radiographs) as part of my medical decision making, as well as reviewing the written report by the radiologist.  Official radiology report(s): No results found.  ____________________________________________   PROCEDURES  Procedure(s) performed (including Critical Care):  Procedures  ____________________________________________   INITIAL IMPRESSION / ASSESSMENT AND PLAN     34 year old female presenting to the emergency department for treatment and evaluation of post op pain at the c-section site. Pain has gradually gotten worse since she has attempted to be more active. She denies specific injury.   ED COURSE  Exam is overall reassuring. Surgical site is clean, dry, and well approximated. Abdomen is soft and non-tender on exam. She has not taken anything to help with pain. She is not breastfeeding. Will prescribe Tramadol and have her follow up with gynecology.  For symptoms that change or worsen if unable to see gynecology, she is to return to the ER.  Upon discharge, patient states that she is depressed and needs to speak to someone. She is crying and inconsolable. Case discussed with Dr. Cyril Loosen who will speak with her.  TTS and psychiatry consults ordered. She will be moved to [facility].    ___________________________________________   FINAL CLINICAL IMPRESSION(S) / ED DIAGNOSES  Final diagnoses:  Post-operative pain  Suicidal ideations     ED Discharge Orders         Ordered    traMADol (ULTRAM) 50 MG tablet  Every 6 hours PRN        08/26/20 1647           [name] was evaluated in Emergency Department on 08/26/2020 for the symptoms described in the history of present illness. She was evaluated in the context of the global COVID-19 pandemic, which necessitated consideration that the patient might be at risk for infection with the SARS-CoV-2 virus that causes COVID-19.  Institutional protocols and algorithms that pertain to the evaluation of patients at risk for COVID-19 are in a state of rapid change based on information released by regulatory bodies including the CDC and federal and state organizations. These policies and algorithms were followed during the patient's care in the ED.   Note:  This document was prepared using Dragon voice recognition software and may include unintentional dictation errors.   Chinita Pester, FNP 08/26/20 1654    Chinita Pester, FNP 08/26/20 7654    Jene Every, MD 08/31/20 1143

## 2020-08-27 MED ORDER — ACETAMINOPHEN 500 MG PO TABS
1000.0000 mg | ORAL_TABLET | Freq: Once | ORAL | Status: AC
  Administered 2020-08-27: 1000 mg via ORAL
  Filled 2020-08-27: qty 2

## 2020-08-27 MED ORDER — ACETAMINOPHEN 325 MG PO TABS
650.0000 mg | ORAL_TABLET | Freq: Once | ORAL | Status: AC
  Administered 2020-08-27: 650 mg via ORAL
  Filled 2020-08-27: qty 2

## 2020-08-27 MED ORDER — HYDROXYZINE HCL 25 MG PO TABS
50.0000 mg | ORAL_TABLET | Freq: Once | ORAL | Status: AC
  Administered 2020-08-27: 50 mg via ORAL
  Filled 2020-08-27: qty 2

## 2020-08-27 NOTE — ED Notes (Signed)
Hourly rounding reveals patient in room. No complaints, stable, in no acute distress. Q15 minute rounds and monitoring via Rover and Officer to continue.   

## 2020-08-27 NOTE — ED Notes (Signed)
patient signed paper transfer form, placed in med rec  

## 2020-08-27 NOTE — BH Assessment (Signed)
Patient has been accepted to [facility].  Patient assigned to [facility] Accepting physician is Dr. Otho Perl.  Call report to [phone].  Representative was [employer].   ER Staff is aware of it:  Rivka Barbara, ER Secretary  Dr. Michiel Sites, ER MD  Vikki Ports,  Patient's Nurse   Address: [address],  [place], [place] [address]

## 2020-08-27 NOTE — ED Notes (Signed)
Pt sleeping. Unlabored.

## 2020-08-27 NOTE — ED Notes (Signed)
All belongings sent with pt.

## 2020-08-27 NOTE — BH Assessment (Addendum)
Referral information for Psychiatric Hospitalization faxed to;   [facility] [phone]),   . [facility] ([phone]---[phone]---[phone]),  . [facility] [phone]),   . [facility] [phone] -[phone]), Currently under review as of 6:34am on 08/27/20  . Turner Daniels [phone]).

## 2020-08-27 NOTE — ED Notes (Signed)
Intake nurse from [facility] called to get information to see if able to get pt a bed.

## 2020-08-27 NOTE — ED Notes (Signed)
Pt ate all of breakfast tray. 

## 2020-08-27 NOTE — ED Provider Notes (Signed)
Emergency Medicine Observation Re-evaluation Note  [name] is a 34 y.o. female, seen on rounds today.  Pt initially presented to the ED for complaints of Post-op Problem Currently, the patient is resting without complaints.  Physical Exam  BP (!) 144/74 (BP Location: Right Arm)   Pulse 80   Temp 98 F (36.7 C) (Oral)   Resp 18   Ht 4\' 11"  (1.499 m)   Wt 75.8 kg   LMP 12/04/2019 (Within Months)   SpO2 100%   Breastfeeding No Comment: c-section 8/27  BMI 33.73 kg/m  Physical Exam General: Resting in no acute distress Cardiac: No cyanosis Lungs: Equal rise and fall Psych: Normal  ED Course / MDM  EKG:    I have reviewed the labs performed to date as well as medications administered while in observation.  Recent changes in the last 24 hours include no acute changes overnight.  Plan  Current plan is for inpatient hospitalization as recommended by psychiatry. Patient is not under full IVC at this time.   9/27, MD 08/27/20 [phone]

## 2020-09-02 ENCOUNTER — Other Ambulatory Visit: Payer: Self-pay

## 2020-09-02 ENCOUNTER — Emergency Department
Admission: EM | Admit: 2020-09-02 | Discharge: 2020-09-03 | Disposition: A | Discharge status: Other Acute Inpatient Hospital | Payer: Medicaid Other | Attending: Emergency Medicine | Admitting: Emergency Medicine

## 2020-09-02 ENCOUNTER — Encounter: Payer: Self-pay | Admitting: Emergency Medicine

## 2020-09-02 DIAGNOSIS — Z91010 Allergy to peanuts: Secondary | ICD-10-CM | POA: Insufficient documentation

## 2020-09-02 DIAGNOSIS — Z79899 Other long term (current) drug therapy: Secondary | ICD-10-CM | POA: Insufficient documentation

## 2020-09-02 DIAGNOSIS — F329 Major depressive disorder, single episode, unspecified: Secondary | ICD-10-CM | POA: Diagnosis not present

## 2020-09-02 DIAGNOSIS — Z20822 Contact with and (suspected) exposure to covid-19: Secondary | ICD-10-CM | POA: Diagnosis not present

## 2020-09-02 DIAGNOSIS — I10 Essential (primary) hypertension: Secondary | ICD-10-CM | POA: Diagnosis not present

## 2020-09-02 DIAGNOSIS — R45851 Suicidal ideations: Secondary | ICD-10-CM

## 2020-09-02 DIAGNOSIS — Z87891 Personal history of nicotine dependence: Secondary | ICD-10-CM | POA: Insufficient documentation

## 2020-09-02 DIAGNOSIS — E039 Hypothyroidism, unspecified: Secondary | ICD-10-CM | POA: Diagnosis not present

## 2020-09-02 LAB — SARS CORONAVIRUS 2 BY RT PCR (HOSPITAL ORDER, PERFORMED IN ~~LOC~~ HOSPITAL LAB): SARS Coronavirus 2: NEGATIVE

## 2020-09-02 LAB — COMPREHENSIVE METABOLIC PANEL
ALT: 14 U/L (ref 0–44)
AST: 15 U/L (ref 15–41)
Albumin: 4 g/dL (ref 3.5–5.0)
Alkaline Phosphatase: 85 U/L (ref 38–126)
Anion gap: 12 (ref 5–15)
BUN: 12 mg/dL (ref 6–20)
CO2: 23 mmol/L (ref 22–32)
Calcium: 9.1 mg/dL (ref 8.9–10.3)
Chloride: 104 mmol/L (ref 98–111)
Creatinine, Ser: 0.52 mg/dL (ref 0.44–1.00)
GFR calc Af Amer: >60 mL/min (ref >60)
GFR calc non Af Amer: >60 mL/min (ref >60)
Glucose, Bld: 83 mg/dL (ref 70–99)
Potassium: 4.2 mmol/L (ref 3.5–5.1)
Sodium: 139 mmol/L (ref 135–145)
Total Bilirubin: 0.9 mg/dL (ref 0.3–1.2)
Total Protein: 8.2 g/dL — ABNORMAL HIGH (ref 6.5–8.1)

## 2020-09-02 LAB — CBC WITH DIFFERENTIAL/PLATELET
Abs Immature Granulocytes: 0.02 10*3/uL (ref 0.00–0.07)
Basophils Absolute: 0 10*3/uL (ref 0.0–0.1)
Basophils Relative: 1 %
Eosinophils Absolute: 0.6 10*3/uL — ABNORMAL HIGH (ref 0.0–0.5)
Eosinophils Relative: 9 %
HCT: 38.1 % (ref 36.0–46.0)
Hemoglobin: 12.6 g/dL (ref 12.0–15.0)
Immature Granulocytes: 0 %
Lymphocytes Relative: 51 %
Lymphs Abs: 3.2 10*3/uL (ref 0.7–4.0)
MCH: 29.8 pg (ref 26.0–34.0)
MCHC: 33.1 g/dL (ref 30.0–36.0)
MCV: 90.1 fL (ref 80.0–100.0)
Monocytes Absolute: 0.3 10*3/uL (ref 0.1–1.0)
Monocytes Relative: 4 %
Neutro Abs: 2.3 10*3/uL (ref 1.7–7.7)
Neutrophils Relative %: 35 %
Platelets: 369 10*3/uL (ref 150–400)
RBC: 4.23 MIL/uL (ref 3.87–5.11)
RDW: 13.2 % (ref 11.5–15.5)
WBC: 6.4 10*3/uL (ref 4.0–10.5)
nRBC: 0 % (ref 0.0–0.2)

## 2020-09-02 LAB — ETHANOL: Alcohol, Ethyl (B): 94 mg/dL — ABNORMAL HIGH (ref <10)

## 2020-09-02 LAB — SALICYLATE LEVEL: Salicylate Lvl: <7 mg/dL — ABNORMAL LOW (ref 7.0–30.0)

## 2020-09-02 LAB — ACETAMINOPHEN LEVEL: Acetaminophen (Tylenol), Serum: <10 ug/mL — ABNORMAL LOW (ref 10–30)

## 2020-09-02 MED ORDER — THIOTHIXENE 5 MG PO CAPS
5.0000 mg | ORAL_CAPSULE | Freq: Every day | ORAL | Status: DC
  Filled 2020-09-02 (×2): qty 1

## 2020-09-02 MED ORDER — ALBUTEROL SULFATE HFA 108 (90 BASE) MCG/ACT IN AERS
2.0000 | INHALATION_SPRAY | Freq: Four times a day (QID) | RESPIRATORY_TRACT | Status: DC | PRN
  Filled 2020-09-02: qty 6.7

## 2020-09-02 MED ORDER — LAMOTRIGINE 25 MG PO TABS
25.0000 mg | ORAL_TABLET | Freq: Two times a day (BID) | ORAL | Status: DC
  Filled 2020-09-02 (×3): qty 1

## 2020-09-02 MED ORDER — FERROUS SULFATE 325 (65 FE) MG PO TABS
325.0000 mg | ORAL_TABLET | Freq: Every day | ORAL | Status: DC
  Filled 2020-09-02 (×2): qty 1

## 2020-09-02 MED ORDER — DULOXETINE HCL 30 MG PO CPEP
30.0000 mg | ORAL_CAPSULE | Freq: Every day | ORAL | Status: DC
  Filled 2020-09-02: qty 1

## 2020-09-02 MED ORDER — ACETAMINOPHEN 325 MG PO TABS
650.0000 mg | ORAL_TABLET | Freq: Four times a day (QID) | ORAL | Status: DC | PRN
  Administered 2020-09-02 – 2020-09-03 (×2): 650 mg via ORAL
  Filled 2020-09-02 (×2): qty 2

## 2020-09-02 MED ORDER — BENZTROPINE MESYLATE 1 MG PO TABS
0.5000 mg | ORAL_TABLET | Freq: Two times a day (BID) | ORAL | Status: DC
  Filled 2020-09-02: qty 1

## 2020-09-02 MED ORDER — FAMOTIDINE 20 MG PO TABS
20.0000 mg | ORAL_TABLET | Freq: Two times a day (BID) | ORAL | Status: DC
  Administered 2020-09-02: 20 mg via ORAL
  Filled 2020-09-02: qty 1

## 2020-09-02 NOTE — ED Notes (Signed)
INVOLUNTARY with all papers on chart/awaiting TTS/PSYCH consult ?

## 2020-09-02 NOTE — ED Notes (Addendum)
Patient with flat affect reports that she was seen in ED recently for severe deppresion and SI thoughts, patient was placed at [facility]. Patient presents to ed today with same c/o, reports that [facility] did not help her, when asked to elaborate what she means by that. "patient shaes with Clinical research associate that there was no one to speak to there and that the nurses and Tech were not adequate over there, she did not receive therapy and that she had no one to talk to over there". Patient to be evaluated by psych plan of care pending.

## 2020-09-02 NOTE — ED Notes (Signed)
Hourly rounding reveals patient sleeping in room. No complaints, stable, in no acute distress. Q15 minute rounds and monitoring via Security Cameras to continue. 

## 2020-09-02 NOTE — ED Notes (Signed)
Hourly rounding reveals patient awake in room. No complaints, stable, in no acute distress. Q15 minute rounds and monitoring via Security Cameras to continue. 

## 2020-09-02 NOTE — ED Notes (Signed)
Pt sitting in lobby, eyes closed with no distress; refuses vs at this time

## 2020-09-02 NOTE — ED Triage Notes (Addendum)
Pt arrived via EMS from home where pt has been drinking ETOH, unknown amount. EMS reports pt called out due to high BP. Pt refusing all VS and protocols in triage. Pt speaking with slurred speech and loose associations. Pt refusing fall band. Pt in triage throwing belongings and BP cuff at staff. Pt cussing as well. This RN unable to complete triage due to pts uncooperativeness.

## 2020-09-02 NOTE — BH Assessment (Signed)
Patient has been accepted to [facility].  Patient assigned to [facility] Accepting physician is Dr. Estill Cotta.  Call report to [phone].  Representative was [employer].   Address: [address]  [place], [place] [address]  Patient bed will be available tomorrow after 9am.

## 2020-09-02 NOTE — BH Assessment (Signed)
Referral information for Psychiatric Hospitalization faxed to;   . [facility] ([phone]- [phone]),   . [facility] ([phone]),  . [facility] ([phone] or [phone])  . [facility] ([phone]),   . [facility] ([phone] -or- [phone]),   . [facility] ([phone]). 

## 2020-09-02 NOTE — ED Notes (Signed)
Patient evaluated by psych awaiting further plan of care.

## 2020-09-02 NOTE — ED Notes (Signed)
Pt belongings: multicolored dress, white underpants, blue colored flip flops, brown and tan handbag. All belongings placed into bag and labeled.

## 2020-09-02 NOTE — ED Notes (Signed)
Report to include Situation, Background, Assessment, and Recommendations received from Amy RN. Patient alert and oriented, warm and dry, in no acute distress. Patient denies SI, HI, AVH. Patient made aware of Q15 minute rounds and security cameras for their safety. Patient instructed to come to me with needs or concerns.

## 2020-09-02 NOTE — ED Notes (Signed)
IVC 

## 2020-09-02 NOTE — ED Notes (Signed)
Box lunch provided.

## 2020-09-02 NOTE — ED Notes (Signed)
Patient is calm and cooperative, ask to watch tv, and ask for lunch tray, nurse had extra tray and gave to her, will continue to monitor, This nurse did receive report from Nurse that was taking care of her in quad.

## 2020-09-02 NOTE — Consult Note (Signed)
[facility] Face-to-Face Psychiatry Consult   Reason for Consult:  Bipolar issues, post partum depression situational issues with DSS keeping her child and other kids  Issues surrounding substance dependence and addictive personality    Referring Physician:   ER MD  Patient Identification: [name] MRN:  [id number]   Principal Diagnosis: <principal problem not specified> Diagnosis:  Active Problems:   * No active hospital problems. *  Same as above   Total Time spent with patient: Close to one hour   Subjective:   [name] is a 34 y.o. female patient admitted with severe depression post delivery of newborn and situational issues surrounding this   HPI:   Patient well known to this system  She has bipolar disorder mixed, with psychosis, mainly  In depressive phase,  Polysubstance dependence and also ---generalized anxiety   She has borderline and dependent personality as well.   She is here again due to severe depression and SI regarding recent birth of daughter who again was taken away by DSS due to her ongoing on and off use of severe polysubstance's.    She was recently at [facility] last week discharged and then came here.   She was at [facility] for two months for high risk delivery inpatient and continued meds.   For those two months she has been free of substances  She had worsening depression and remorse at the taking away of the child along with the loss of other kids due to the issues of inability to care due to addictions and related matters.  She shared much bereavement and grief with supportive and empathic listening   She is still at higher risk and  Needs inpatient care at this time as she cannot contract for safety     Past Psychiatric History:  Multiple admits already previously noted   Risk to Self: Suicidal Ideation: Yes-Currently Present Suicidal Intent: No Is patient at risk for suicide?: No Suicidal Plan?: No Specify Current Suicidal  Plan: Reports of having no plan Access to Means: No Specify Access to Suicidal Means: Reports of having no plan What has been your use of drugs/alcohol within the last 12 months?: Cocaine, Methamphetamine, Alcohol and Cannabis   How many times?: 1 Other Self Harm Risks: Active Drug use Triggers for Past Attempts: Other (Comment) (Losing her children) Intentional Self Injurious Behavior: None Risk to Others: Homicidal Ideation: No Thoughts of Harm to Others: No Current Homicidal Intent: No Current Homicidal Plan: No Access to Homicidal Means: No Identified Victim: Reports of none History of harm to others?: No Assessment of Violence: None Noted Violent Behavior Description: Reports of none Does patient have access to weapons?: No Criminal Charges Pending?: No Does patient have a court date: No Prior Inpatient Therapy: Prior Inpatient Therapy: Yes Prior Therapy Dates: Multiple Hospitalizations Prior Therapy Facilty/Provider(s): [facility], [facility] Reason for Treatment: Depression and  anxiety Prior Outpatient Therapy: Prior Outpatient Therapy: No Does patient have an ACCT team?: No Does patient have Intensive In-House Services?  : No Does patient have [facility] services? : No Does patient have P4CC services?: No  Past Medical History:  Past Medical History:  Diagnosis Date  . Anxiety   . Depression   . Gestational diabetes   . History of substance abuse (HCC)   . History of suicide attempt   . History of thyroid disease   . Hypertension   . Thyroid disease     Past Surgical History:  Procedure Laterality Date  .  CESAREAN SECTION  2000, 2002, 2013  . WISDOM TOOTH EXTRACTION     Family History:  Family History  Problem Relation Age of Onset  . Diabetes Mother    Family Psychiatric  History:  Mom deceased with ETOh and depression, Dad with depression and anxiety  Social History:  Social History   Substance and Sexual Activity  Alcohol Use Not Currently    Comment: social drinker     Social History   Substance and Sexual Activity  Drug Use Not Currently  . Types: Cocaine, Marijuana   Comment: Positive UDS 12/2017    Social History   Socioeconomic History  . Marital status: Single    Spouse name: Not on file  . Number of children: Not on file  . Years of education: Not on file  . Highest education level: Not on file  Occupational History  . Not on file  Tobacco Use  . Smoking status: Former Smoker    Packs/day: 0.50  . Smokeless tobacco: Never Used  Substance and Sexual Activity  . Alcohol use: Not Currently    Comment: social drinker  . Drug use: Not Currently    Types: Cocaine, Marijuana    Comment: Positive UDS 12/2017  . Sexual activity: Not Currently    Birth control/protection: None  Other Topics Concern  . Not on file  Social History Narrative  . Not on file   Social Determinants of Health   Financial Resource Strain:   . Difficulty of Paying Living Expenses: Not on file  Food Insecurity:   . Worried About Programme researcher, broadcasting/film/video in the Last Year: Not on file  . Ran Out of Food in the Last Year: Not on file  Transportation Needs:   . Lack of Transportation (Medical): Not on file  . Lack of Transportation (Non-Medical): Not on file  Physical Activity:   . Days of Exercise per Week: Not on file  . Minutes of Exercise per Session: Not on file  Stress:   . Feeling of Stress : Not on file  Social Connections:   . Frequency of Communication with Friends and Family: Not on file  . Frequency of Social Gatherings with Friends and Family: Not on file  . Attends Religious Services: Not on file  . Active Member of Clubs or Organizations: Not on file  . Attends Banker Meetings: Not on file  . Marital Status: Not on file   Additional Social History:  She missed her DSS hearing recently no transportation  She lives alone and needs to change to half way house.  Her deceased Mom left her some rent money        Allergies:   Allergies  Allergen Reactions  . Aspirin Anaphylaxis, Diarrhea and Nausea And Vomiting  . Banana Anaphylaxis, Itching, Shortness Of Breath and Swelling  . Other Itching, Shortness Of Breath and Swelling  . Peanut Oil Itching, Shortness Of Breath and Swelling  . Peanut-Containing Drug Products Shortness Of Breath, Itching and Swelling  . Pecan Nut (Diagnostic) Anaphylaxis    Labs:  Results for orders placed or performed during the hospital encounter of 09/02/20 (from the past 48 hour(s))  SARS Coronavirus 2 by RT PCR (hospital order, performed in [facility] hospital lab) Nasopharyngeal Nasopharyngeal Swab     Status: None   Collection Time: 09/02/20  8:53 AM   Specimen: Nasopharyngeal Swab  Result Value Ref Range   SARS Coronavirus 2 NEGATIVE NEGATIVE    Comment: (NOTE) SARS-CoV-2 target nucleic  acids are NOT DETECTED.  The SARS-CoV-2 RNA is generally detectable in upper and lower respiratory specimens during the acute phase of infection. The lowest concentration of SARS-CoV-2 viral copies this assay can detect is 250 copies / mL. A negative result does not preclude SARS-CoV-2 infection and should not be used as the sole basis for treatment or other patient management decisions.  A negative result may occur with improper specimen collection / handling, submission of specimen other than nasopharyngeal swab, presence of viral mutation(s) within the areas targeted by this assay, and inadequate number of viral copies (<250 copies / mL). A negative result must be combined with clinical observations, patient history, and epidemiological information.  Fact Sheet for Patients:   BoilerBrush.com.cyhttps://www.fda.gov/media/136312/download  Fact Sheet for Healthcare Providers: https://pope.com/https://www.fda.gov/media/136313/download  This test is not yet approved or  cleared by the [place] FDA and has been authorized for detection and/or diagnosis of SARS-CoV-2 by FDA under an Emergency Use  Authorization (EUA).  This EUA will remain in effect (meaning this test can be used) for the duration of the COVID-19 declaration under Section 564(b)(1) of the Act, 21 U.S.C. section 360bbb-3(b)(1), unless the authorization is terminated or revoked sooner.  Performed at [facility], [address] [place], [place] [address]   CBC with Differential     Status: Abnormal   Collection Time: 09/02/20  8:53 AM  Result Value Ref Range   WBC 6.4 4.0 - 10.5 K/uL   RBC 4.23 3.87 - 5.11 MIL/uL   Hemoglobin 12.6 12.0 - 15.0 g/dL   HCT 60.438.1 36 - 46 %   MCV 90.1 80.0 - 100.0 fL   MCH 29.8 26.0 - 34.0 pg   MCHC 33.1 30.0 - 36.0 g/dL   RDW 54.013.2 98.111.5 - 19.115.5 %   Platelets 369 150 - 400 K/uL   nRBC 0.0 0.0 - 0.2 %   Neutrophils Relative % 35 %   Neutro Abs 2.3 1.7 - 7.7 K/uL   Lymphocytes Relative 51 %   Lymphs Abs 3.2 0.7 - 4.0 K/uL   Monocytes Relative 4 %   Monocytes Absolute 0.3 0 - 1 K/uL   Eosinophils Relative 9 %   Eosinophils Absolute 0.6 (H) 0 - 0 K/uL   Basophils Relative 1 %   Basophils Absolute 0.0 0 - 0 K/uL   Immature Granulocytes 0 %   Abs Immature Granulocytes 0.02 0.00 - 0.07 K/uL    Comment: Performed at [facility], [address] [place], [place] [address]  Comprehensive metabolic panel     Status: Abnormal   Collection Time: 09/02/20  8:53 AM  Result Value Ref Range   Sodium 139 135 - 145 mmol/L   Potassium 4.2 3.5 - 5.1 mmol/L   Chloride 104 98 - 111 mmol/L   CO2 23 22 - 32 mmol/L   Glucose, Bld 83 70 - 99 mg/dL    Comment: Glucose reference range applies only to samples taken after fasting for at least 8 hours.   BUN 12 6 - 20 mg/dL   Creatinine, Ser 5.620.52 0.44 - 1.00 mg/dL   Calcium 9.1 8.9 - 13.010.3 mg/dL   Total Protein 8.2 (H) 6.5 - 8.1 g/dL   Albumin 4.0 3.5 - 5.0 g/dL   AST 15 15 - 41 U/L   ALT 14 0 - 44 U/L   Alkaline Phosphatase 85 38 - 126 U/L   Total Bilirubin 0.9 0.3 - 1.2 mg/dL   GFR calc non Af Amer >60 >60 mL/min  GFR calc  Af Amer >60 >60 mL/min   Anion gap 12 5 - 15    Comment: Performed at [facility], [address] [place], [place] [address]  Salicylate level     Status: Abnormal   Collection Time: 09/02/20  8:55 AM  Result Value Ref Range   Salicylate Lvl <7.0 (L) 7.0 - 30.0 mg/dL    Comment: Performed at [facility], [address] [place], [place] [address]  Acetaminophen level     Status: Abnormal   Collection Time: 09/02/20  8:55 AM  Result Value Ref Range   Acetaminophen (Tylenol), Serum <10 (L) 10 - 30 ug/mL    Comment: (NOTE) Therapeutic concentrations vary significantly. A range of 10-30 ug/mL  may be an effective concentration for many patients. However, some  are best treated at concentrations outside of this range. Acetaminophen concentrations >150 ug/mL at 4 hours after ingestion  and >50 ug/mL at 12 hours after ingestion are often associated with  toxic reactions.  Performed at [facility], [address] [place], [place] [address]   Ethanol     Status: Abnormal   Collection Time: 09/02/20  8:55 AM  Result Value Ref Range   Alcohol, Ethyl (B) 94 (H) <10 mg/dL    Comment: (NOTE) Lowest detectable limit for serum alcohol is 10 mg/dL.  For medical purposes only. Performed at [facility], [address] [place], [place] [address]     Current Facility-Administered Medications  Medication Dose Route Frequency Provider Last Rate Last Admin  . acetaminophen (TYLENOL) tablet 650 mg  650 mg Oral Q6H PRN Gilles Chiquito, MD   650 mg at 09/02/20 1732  . albuterol (VENTOLIN HFA) 108 (90 Base) MCG/ACT inhaler 2 puff  2 puff Inhalation Q6H PRN Gilles Chiquito, MD      . benztropine (COGENTIN) tablet 0.5 mg  0.5 mg Oral BID Roselind Messier, MD      . DULoxetine (CYMBALTA) DR capsule 30 mg  30 mg Oral Daily Roselind Messier, MD      . famotidine (PEPCID) tablet 20 mg  20 mg Oral BID Gilles Chiquito, MD   20 mg at 09/02/20 1730  . ferrous  sulfate tablet 325 mg  325 mg Oral Daily Gilles Chiquito, MD      . lamoTRIgine (LAMICTAL) tablet 25 mg  25 mg Oral BID Roselind Messier, MD      . thiothixene (NAVANE) capsule 5 mg  5 mg Oral QHS Roselind Messier, MD       Current Outpatient Medications  Medication Sig Dispense Refill  . acetaminophen (TYLENOL) 325 MG tablet Take 650 mg by mouth every 6 (six) hours as needed for pain.    Marland Kitchen albuterol (VENTOLIN HFA) 108 (90 Base) MCG/ACT inhaler Inhale 2 puffs into the lungs every 6 (six) hours as needed for shortness of breath or wheezing.    . calcium carbonate (OS-CAL) 1250 (500 Ca) MG chewable tablet Chew 2 tablets by mouth every 4 (four) hours as needed for indigestion or heartburn.    . cyclobenzaprine (FLEXERIL) 5 MG tablet Take 5 mg by mouth 3 (three) times daily as needed for muscle spasms.    . famotidine (PEPCID) 20 MG tablet Take 20 mg by mouth 2 (two) times daily.    . ferrous sulfate 325 (65 FE) MG tablet Take 325 mg by mouth daily.    . QUEtiapine (SEROQUEL) 100 MG tablet Take 100 mg by mouth at bedtime.    Marland Kitchen  QUEtiapine (SEROQUEL) 25 MG tablet Take 25 mg by mouth every 6 (six) hours as needed.    . sertraline (ZOLOFT) 50 MG tablet Take 50 mg by mouth at bedtime.     . traMADol (ULTRAM) 50 MG tablet Take 1 tablet (50 mg total) by mouth every 6 (six) hours as needed. 12 tablet 0    Musculoskeletal: Strength & Muscle Tone: normal  Gait & Station: normal  Patient leans: na  Psychiatric Specialty Exam: Physical Exam  Review of Systems  Blood pressure (!) 155/90, pulse 71, temperature 97.8 F (36.6 C), temperature source Oral, resp. rate 18, height 4\' 11"  (1.499 m), weight 75.8 kg, last menstrual period 12/04/2019, SpO2 98 %.Body mass index is 33.75 kg/m.  Mental Status  Very sad tearful depressed AA female in grief and mourning Oriented times four Affect flat mood depressed and anxious Thought process and content --remorse sadness guilt no frank psychosis or mania  Speech  --normal but pressured Concentration and attention okay Consciousness not clouded or fluctuant Memory normal --recent remote and immediate  Si active HI none Movements --no shakes tremors tics Fund of knowledge, intelligence below average Judgement insight reliability fair Abstraction limited Rapport and eye contact as best as possible with grief                                                        Language English Recall --normal Akathisia none Handedness not known Aims not done Assets ---seeks help  ADL'--limited from grief and post partum Cognition  ---fair with crisis Sleep on and off         Treatment Plan Summary:   AA female with long dual diagnosis history now still admission for severe grief depression SI and bereavement     Disposition:  Awaits bed transfer at this time   14/09/2019, MD 09/02/2020 5:33 PM

## 2020-09-02 NOTE — ED Notes (Signed)
Pt in room watching tv.  meds given.  Pt alert

## 2020-09-02 NOTE — ED Notes (Signed)
Hourly rounding reveals patient awake in room. Stable, in no acute distress. Q15 minute rounds and monitoring via Security Cameras to continue. 

## 2020-09-02 NOTE — ED Notes (Signed)
Pt taken to triage via w/c; pt currently calm & cooperative, A&Ox3; pt reports that she called EMS because she thought her BP was up; pt unable to tell why and denies any c/o or symptoms; pt admits to ETOH tonight but no drug use; st she doesn't really want to be seen now but does not have a ride home at present and is requesting to just wait in the lobby until she can

## 2020-09-02 NOTE — ED Provider Notes (Signed)
[facility] Emergency Department Provider Note  ____________________________________________   First MD Initiated Contact with Patient 09/02/20 [phone]     (approximate)  I have reviewed the triage vital signs and the nursing notes.   HISTORY  Chief Complaint Hypertension   HPI [name] is a 34 y.o. female with a past medical history anxiety, depression, HTN, sevenths abuse, thyroid disease, and recent C-section on 8/27 not currently in custody of her child who presents for assessment of concerns for elevated blood pressure as well as feeling sad and depressed and suicidal with a plan to cut her wrist. Patient states she has been taking her Zoloft but feels it is not helping. She states she has not seen a psychiatrist or therapist and she was last seen on the third. She states she has still has some soreness in her abdomen from her C-section site but denies any other acute physical symptoms including fevers, chills, cough, headache, sore throat, vomiting, diarrhea, dysuria, or other acute physical complaints. She states she drank an unspecified alcohol last night but denies other illegal drug use. Denies HI or hallucinations. States this is similar to how she has felt in the past though she does not feel safe being alone because she feels like she is going to cut her wrist.         Past Medical History:  Diagnosis Date  . Anxiety   . Depression   . Gestational diabetes   . History of substance abuse (HCC)   . History of suicide attempt   . History of thyroid disease   . Hypertension   . Thyroid disease     Patient Active Problem List   Diagnosis Date Noted  . Intrauterine growth restriction, antepartum, third trimester, not applicable or unspecified fetus   . Supervision of high risk pregnancy, antepartum 07/23/2020  . Substance abuse complicating pregnancy, antepartum 07/23/2020  . Bipolar disease in pregnancy, unspecified trimester (HCC)  07/23/2020  . Genital herpes affecting pregnancy 07/23/2020  . Abnormal glucose tolerance test in pregnancy 07/23/2020  . High risk pregnancy due to history of preterm labor, antepartum 07/23/2020  . Chronic hypertension during pregnancy, antepartum 07/23/2020  . History of prior pregnancy with IUGR newborn 07/23/2020  . Asthma affecting pregnancy, antepartum 07/23/2020  . History of fetal anomaly in prior pregnancy, currently pregnant 07/23/2020  . Diet controlled gestational diabetes mellitus (GDM) in third trimester   . MDD (major depressive disorder), recurrent severe, without psychosis (HCC) 07/18/2020  . Abdominal pain in pregnancy, third trimester 07/17/2020  . Major depressive disorder, recurrent severe without psychotic features (HCC) 07/15/2020  . Cramping complicating pregnancy, antepartum 06/14/2020  . Severe recurrent major depression without psychotic features (HCC) 06/03/2020  . Fall 05/28/2020  . History of cesarean delivery 05/28/2020  . Bipolar disorder, curr episode mixed, severe, with psychotic features (HCC)   . MDD (major depressive disorder), recurrent episode, severe (HCC) 04/08/2020  . Thoughts of self harm 02/01/2020  . Abdominal pain 03/20/2018  . Cocaine use disorder, moderate, dependence (HCC) 01/01/2018  . Cannabis use disorder, moderate, dependence (HCC) 01/01/2018  . Pregnant 12/31/2017  . Medically noncompliant 12/31/2017  . Tobacco use disorder 01/12/2016  . Stimulant use disorder (HCC) (cocaine) 01/12/2016  . Alcohol use disorder, moderate, dependence (HCC) 01/12/2016  . Self-inflicted laceration of wrist (HCC) 01/11/2016    Past Surgical History:  Procedure Laterality Date  . CESAREAN SECTION  2000, 2002, 2013  . WISDOM TOOTH EXTRACTION      Prior  to Admission medications   Medication Sig Start Date End Date Taking? Authorizing Provider  acetaminophen (TYLENOL) 325 MG tablet Take 650 mg by mouth every 6 (six) hours as needed for pain. 08/24/20    [provider]  albuterol (VENTOLIN HFA) 108 (90 Base) MCG/ACT inhaler Inhale 2 puffs into the lungs every 6 (six) hours as needed for shortness of breath or wheezing. 08/19/20 08/19/21  [provider]  calcium carbonate (OS-CAL) 1250 (500 Ca) MG chewable tablet Chew 2 tablets by mouth every 4 (four) hours as needed for indigestion or heartburn. 08/24/20 09/23/20  [provider]  cyclobenzaprine (FLEXERIL) 5 MG tablet Take 5 mg by mouth 3 (three) times daily as needed for muscle spasms. 08/19/20 09/18/20  [provider]  famotidine (PEPCID) 20 MG tablet Take 20 mg by mouth 2 (two) times daily. 08/08/20 09/07/20  [provider]  ferrous sulfate 325 (65 FE) MG tablet Take 325 mg by mouth daily. 08/24/20 08/24/21  [provider]  QUEtiapine (SEROQUEL) 100 MG tablet Take 100 mg by mouth at bedtime.    [provider]  QUEtiapine (SEROQUEL) 25 MG tablet Take 25 mg by mouth every 6 (six) hours as needed.    [provider]  sertraline (ZOLOFT) 50 MG tablet Take 50 mg by mouth at bedtime.     [provider]  traMADol (ULTRAM) 50 MG tablet Take 1 tablet (50 mg total) by mouth every 6 (six) hours as needed. 08/26/20   Triplett, Rulon Eisenmengerari B, FNP    Allergies Aspirin, Banana, Other, Peanut oil, Peanut-containing drug products, and Pecan nut (diagnostic)  Family History  Problem Relation Age of Onset  . Diabetes Mother     Social History Social History   Tobacco Use  . Smoking status: Former Smoker    Packs/day: 0.50  . Smokeless tobacco: Never Used  Substance Use Topics  . Alcohol use: Not Currently    Comment: social drinker  . Drug use: Not Currently    Types: Cocaine, Marijuana    Comment: Positive UDS 12/2017    Review of Systems  Review of Systems  Constitutional: Negative for chills and fever.  HENT: Negative for sore throat.   Eyes: Negative for pain.  Respiratory: Negative for cough and stridor.     Cardiovascular: Negative for chest pain.  Gastrointestinal: Negative for vomiting.  Skin: Negative for rash.  Neurological: Negative for seizures, loss of consciousness and headaches.  Psychiatric/Behavioral: Positive for depression, substance abuse and suicidal ideas. The patient is nervous/anxious.   All other systems reviewed and are negative.     ____________________________________________   PHYSICAL EXAM:  VITAL SIGNS: ED Triage Vitals  Enc Vitals Group     BP 09/02/20 0322 127/79     Pulse Rate 09/02/20 0322 75     Resp 09/02/20 0322 18     Temp 09/02/20 0322 97.9 F (36.6 C)     Temp Source 09/02/20 0322 Oral     SpO2 09/02/20 0322 100 %     Weight 09/02/20 0333 167 lb 1.7 oz (75.8 kg)     Height 09/02/20 0333 4\' 11"  (1.499 m)     Head Circumference --      Peak Flow --      Pain Score 09/02/20 0323 0     Pain Loc --      Pain Edu? --      Excl. in GC? --    Vitals:   09/02/20 0322 09/02/20 0843  BP: 127/79 Marland Kitchen(!)  155/90  Pulse: 75 71  Resp: 18 18  Temp: 97.9 F (36.6 C) 97.8 F (36.6 C)  SpO2: 100% 98%   Physical Exam Vitals and nursing note reviewed.  Constitutional:      General: She is not in acute distress.    Appearance: She is well-developed.  HENT:     Head: Normocephalic and atraumatic.     Right Ear: External ear normal.     Left Ear: External ear normal.     Nose: Nose normal.  Eyes:     Conjunctiva/sclera: Conjunctivae normal.  Cardiovascular:     Rate and Rhythm: Normal rate and regular rhythm.     Heart sounds: No murmur heard.   Pulmonary:     Effort: Pulmonary effort is normal. No respiratory distress.     Breath sounds: Normal breath sounds.  Abdominal:     Palpations: Abdomen is soft.     Tenderness: There is no abdominal tenderness.  Musculoskeletal:     Cervical back: Neck supple.  Skin:    General: Skin is warm and dry.  Neurological:     Mental Status: She is alert.  Psychiatric:        Mood and Affect: Mood is  depressed. Affect is tearful.        Thought Content: Thought content includes suicidal ideation. Thought content does not include homicidal ideation. Thought content includes suicidal plan. Thought content does not include homicidal plan.      ____________________________________________   LABS (all labs ordered are listed, but only abnormal results are displayed)  Labs Reviewed  CBC WITH DIFFERENTIAL/PLATELET - Abnormal; Notable for the following components:      Result Value   Eosinophils Absolute 0.6 (*)    All other components within normal limits  COMPREHENSIVE METABOLIC PANEL - Abnormal; Notable for the following components:   Total Protein 8.2 (*)    All other components within normal limits  SARS CORONAVIRUS 2 BY RT PCR (HOSPITAL ORDER, PERFORMED IN  HOSPITAL LAB)  URINE DRUG SCREEN, QUALITATIVE ([facility] ONLY)  SALICYLATE LEVEL  ACETAMINOPHEN LEVEL  ETHANOL  POC URINE PREG, ED   ____________________________________________  ____________________________________________   PROCEDURES  Procedure(s) performed (including Critical Care):  Procedures   ____________________________________________   INITIAL IMPRESSION / ASSESSMENT AND PLAN / ED COURSE        Patient presents with [place] to history exam for assessment of concerns for elevated blood pressure after endorsing EtOH use last night as well as SI with a plan to cut her wrists in the setting of a history of depression currently on Zoloft as well as recent loss of custody of her child after C-section last month. Afebrile hemodynamically stable with a normal blood pressure on arrival. Exam as above. Routine psychiatry labs sent to assess for organic etiologies for patient's SI. Psychiatry service and TTS consulted.  The patient has been placed in psychiatric observation due to the need to provide a safe environment for the patient while obtaining psychiatric consultation and evaluation, as well as ongoing  medical and medication management to treat the patient's condition.  The patient has been placed under full IVC at this time.   ____________________________________________   FINAL CLINICAL IMPRESSION(S) / ED DIAGNOSES  Final diagnoses:  Suicidal ideations    Medications - No data to display   ED Discharge Orders    None       Note:  This document was prepared using Dragon voice recognition software and may include unintentional dictation errors.  Gilles Chiquito, MD 09/02/20 1051

## 2020-09-02 NOTE — BH Assessment (Signed)
Assessment Note  [name] is an 34 y.o. female who presents to the ER due to thoughts of ending her life. Patient states she was recently inpatient with [facility] and she felt the admission didn't help her. She reports of been upset due to DSS taking her new born child, after she gave birth to her. She's also upset with the fact she missed the court/hearing with DSS because she didn't have transportation. She further explained, she felt hopeless about the hearing because she doesn't believe it was nothing she could have said or done that would have help her get the child back.  During the interview, the patient was cooperative and pleasant. She was able to provide appropirate answers to the questions.  She would often become tearful and it was difficult to understand what she was saying. When asked to repeat herself, she was able to answer with no problems, as lone as she wasn't crying. Patient denies history of violence and aggression. She also denies current involvement with the legal system. Patient denies HI and AV/H.  Diagnosis: Major Depression  Past Medical History:  Past Medical History:  Diagnosis Date  . Anxiety   . Depression   . Gestational diabetes   . History of substance abuse (HCC)   . History of suicide attempt   . History of thyroid disease   . Hypertension   . Thyroid disease     Past Surgical History:  Procedure Laterality Date  . CESAREAN SECTION  2000, 2002, 2013  . WISDOM TOOTH EXTRACTION      Family History:  Family History  Problem Relation Age of Onset  . Diabetes Mother     Social History:  reports that she has quit smoking. She smoked 0.50 packs per day. She has never used smokeless tobacco. She reports previous alcohol use. She reports previous drug use. Drugs: Cocaine and Marijuana.  Additional Social History:  Alcohol / Drug Use Pain Medications: See PTA Prescriptions: See PTA Over the Counter: See PTA History of alcohol /  drug use?: Yes Longest period of sobriety (when/how long): Unable to quantify  CIWA: CIWA-Ar BP: (!) 155/90 Pulse Rate: 71 COWS:    Allergies:  Allergies  Allergen Reactions  . Aspirin Anaphylaxis, Diarrhea and Nausea And Vomiting  . Banana Anaphylaxis, Itching, Shortness Of Breath and Swelling  . Other Itching, Shortness Of Breath and Swelling  . Peanut Oil Itching, Shortness Of Breath and Swelling  . Peanut-Containing Drug Products Shortness Of Breath, Itching and Swelling  . Pecan Nut (Diagnostic) Anaphylaxis    Home Medications: (Not in a hospital admission)   OB/GYN Status:  Patient's last menstrual period was 12/04/2019 (within months).  General Assessment Data Location of Assessment: [facility] ED TTS Assessment: In system Is this a Tele or Face-to-Face Assessment?: Face-to-Face Is this an Initial Assessment or a Re-assessment for this encounter?: Initial Assessment Patient Accompanied by:: N/A Language Other than English: No Living Arrangements: Other (Comment) (Private Home) What gender do you identify as?: Female Date Telepsych consult ordered in [facility]: 09/02/20 Time Telepsych consult ordered in [facility]: 0834 Marital status: Single Pregnancy Status: No Living Arrangements: Alone Can pt return to current living arrangement?: Yes Admission Status: Involuntary Petitioner: ED Attending Is patient capable of signing voluntary admission?: No (Under IVC) Referral Source: Self/Family/Friend Insurance type: None  Medical Screening Exam [facility] Walk-in ONLY) Medical Exam completed: Yes  Crisis Care Plan Living Arrangements: Alone Legal Guardian: Other: (Self) Name of Psychiatrist: Reports of none Name of Therapist:  Reports of none  Education Status Is patient currently in school?: No Is the patient employed, unemployed or receiving disability?: Unemployed  Risk to self with the past 6 months Suicidal Ideation: Yes-Currently Present Has patient been a risk to self within  the past 6 months prior to admission? : No Suicidal Intent: No Has patient had any suicidal intent within the past 6 months prior to admission? : No Is patient at risk for suicide?: No Suicidal Plan?: No Has patient had any suicidal plan within the past 6 months prior to admission? : No Specify Current Suicidal Plan: Reports of having no plan Access to Means: No Specify Access to Suicidal Means: Reports of having no plan What has been your use of drugs/alcohol within the last 12 months?: Cocaine, Methamphetamine, Alcohol and Cannabis   Previous Attempts/Gestures: Yes How many times?: 1 Other Self Harm Risks: Active Drug use Triggers for Past Attempts: Other (Comment) (Losing her children) Intentional Self Injurious Behavior: None Family Suicide History: Unknown Recent stressful life event(s): Other (Comment), Loss (Comment), Conflict (Comment), Trauma (Comment) Persecutory voices/beliefs?: No Depression: Yes Depression Symptoms: Insomnia, Tearfulness, Isolating, Guilt, Fatigue, Loss of interest in usual pleasures, Feeling worthless/self pity Substance abuse history and/or treatment for substance abuse?: Yes Suicide prevention information given to non-admitted patients: Not applicable  Risk to Others within the past 6 months Homicidal Ideation: No Does patient have any lifetime risk of violence toward others beyond the six months prior to admission? : No Thoughts of Harm to Others: No Current Homicidal Intent: No Current Homicidal Plan: No Access to Homicidal Means: No Identified Victim: Reports of none History of harm to others?: No Assessment of Violence: None Noted Violent Behavior Description: Reports of none Does patient have access to weapons?: No Criminal Charges Pending?: No Does patient have a court date: No Is patient on probation?: No  Psychosis Hallucinations: None noted Delusions: None noted  Mental Status Report Appearance/Hygiene: Unremarkable, In scrubs Eye  Contact: Good Motor Activity: Freedom of movement, Unremarkable Speech: Logical/coherent, Unremarkable Level of Consciousness: Alert Mood: Depressed, Anxious, Sad, Pleasant Affect: Appropriate to circumstance, Depressed, Sad Anxiety Level: Minimal Thought Processes: Coherent, Relevant Judgement: Unimpaired Orientation: Person, Place, Time, Situation, Appropriate for developmental age Obsessive Compulsive Thoughts/Behaviors: Minimal  Cognitive Functioning Concentration: Normal Memory: Recent Intact, Remote Intact Is patient IDD: No Insight: Fair Impulse Control: Fair Appetite: Good Have you had any weight changes? : No Change Sleep: Decreased Total Hours of Sleep: 2 Vegetative Symptoms: None  ADLScreening [facility] Assessment Services) Patient's cognitive ability adequate to safely complete daily activities?: Yes Patient able to express need for assistance with ADLs?: Yes Independently performs ADLs?: Yes (appropriate for developmental age)  Prior Inpatient Therapy Prior Inpatient Therapy: Yes Prior Therapy Dates: Multiple Hospitalizations Prior Therapy Facilty/Provider(s): [facility], [facility] Reason for Treatment: Depression and  anxiety  Prior Outpatient Therapy Prior Outpatient Therapy: No Does patient have an ACCT team?: No Does patient have Intensive In-House Services?  : No Does patient have [facility] services? : No Does patient have P4CC services?: No  ADL Screening (condition at time of admission) Patient's cognitive ability adequate to safely complete daily activities?: Yes Is the patient deaf or have difficulty hearing?: No Does the patient have difficulty seeing, even when wearing glasses/contacts?: No Does the patient have difficulty concentrating, remembering, or making decisions?: No Patient able to express need for assistance with ADLs?: Yes Does the patient have difficulty dressing or bathing?: No Independently performs ADLs?: Yes (appropriate for  developmental age) Does the  patient have difficulty walking or climbing stairs?: No Weakness of Legs: None Weakness of Arms/Hands: None  Home Assistive Devices/Equipment Home Assistive Devices/Equipment: None  Therapy Consults (therapy consults require a physician order) PT Evaluation Needed: No OT Evalulation Needed: No SLP Evaluation Needed: No Abuse/Neglect Assessment (Assessment to be complete while patient is alone) Abuse/Neglect Assessment Can Be Completed: Yes Physical Abuse: Denies Verbal Abuse: Denies Sexual Abuse: Denies Exploitation of patient/patient's resources: Denies Self-Neglect: Denies Values / Beliefs Cultural Requests During Hospitalization: None Spiritual Requests During Hospitalization: None Consults Spiritual Care Consult Needed: No Transition of Care Team Consult Needed: No Advance Directives (For Healthcare) Does Patient Have a Medical Advance Directive?: No Would patient like information on creating a medical advance directive?: No - Patient declined  Disposition:  Disposition Initial Assessment Completed for this Encounter: Yes  On Site Evaluation by:   Reviewed with Physician:    Lilyan Gilford MS, LCAS, [facility], NCC Therapeutic Triage Specialist 09/02/2020 4:14 PM

## 2020-09-02 NOTE — ED Notes (Signed)
Resumed care from wendy rn.  Pt in room.  Pt calm and cooperative.

## 2020-09-03 NOTE — ED Notes (Signed)
[facility] called report given to admission nurse Velva Harman. Ok to send patient over via [employer].

## 2020-09-03 NOTE — ED Notes (Signed)
Hourly rounding reveals patient sleeping in room. No complaints, stable, in no acute distress. Q15 minute rounds and monitoring via Security Cameras to continue. 

## 2020-09-03 NOTE — ED Notes (Signed)
Assumed care of patient, patient slept comfortably throughout the night. Patient was up early c/o of headache on prior shift. Po meds given headache patient went back to sleep. Reports 2/10 when awakened for my shift. Awaiting transfer to [facility] this morning. Safety maintained will continue to monitor.

## 2020-09-03 NOTE — ED Notes (Signed)
Hourly rounding reveals patient awake in room. No complaints, stable, in no acute distress. Q15 minute rounds and monitoring via Security Cameras to continue. 

## 2020-09-12 ENCOUNTER — Emergency Department
Admission: EM | Admit: 2020-09-12 | Discharge: 2020-09-12 | Discharge status: Against Medical Advice | Payer: Medicaid Other

## 2020-09-12 NOTE — ED Triage Notes (Signed)
First nurse note  Pt reported to ems that she used cocaine this am and feels different than she usually does, states that she woke up on the floor. Ems reports that she thinks someone is after her because the police were at her home.   vss

## 2020-10-01 ENCOUNTER — Ambulatory Visit: Payer: Medicaid Other

## 2020-10-05 ENCOUNTER — Telehealth: Payer: Self-pay

## 2020-10-05 NOTE — Telephone Encounter (Signed)
Client scheduled pp appt with [facility], but [facility] as scheduled. Call to client to reschedule appt and per recorded message, call can not be completed due to restrictions on phone. Re-attempted call with same result. Jossie Ng, RN

## 2020-11-25 ENCOUNTER — Ambulatory Visit: Payer: Medicaid Other | Admitting: Physician Assistant

## 2020-11-25 ENCOUNTER — Other Ambulatory Visit: Payer: Self-pay

## 2020-11-25 ENCOUNTER — Encounter: Payer: Self-pay | Admitting: Physician Assistant

## 2020-11-25 ENCOUNTER — Other Ambulatory Visit: Payer: Self-pay | Admitting: Physician Assistant

## 2020-11-25 DIAGNOSIS — O24419 Gestational diabetes mellitus in pregnancy, unspecified control: Secondary | ICD-10-CM

## 2020-11-25 DIAGNOSIS — R87613 High grade squamous intraepithelial lesion on cytologic smear of cervix (HGSIL): Secondary | ICD-10-CM | POA: Diagnosis not present

## 2020-11-25 LAB — PREGNANCY, URINE: Preg Test, Ur: NEGATIVE

## 2020-11-25 LAB — WET PREP FOR TRICH, YEAST, CLUE
Trichomonas Exam: NEGATIVE
Yeast Exam: NEGATIVE

## 2020-11-25 NOTE — Progress Notes (Signed)
Post Partum Exam  [name] is a 34 y.o. [phone] female who presents for a postpartum visit. She is [redacted]w[redacted]d  postpartum following a low cervical transverse Cesarean section. I have fully reviewed the prenatal and intrapartum course. The delivery was at [redacted]w[redacted]d gestational weeks.  Anesthesia: epidural. Postpartum course has been complicated by  Poly SA, Cluster B personality disorder in adult, sucidal Ideation , chronic HTN, A2GDM . Baby's course has been complicated by loss of maternal custody shortly after birth, now in DSS custody and pt has not had contact with the infant girl since. FOB uninvolved. Pecola Leisure is feeding by likely bottle Bleeding no bleeding. Bowel function is normal. Bladder function is normal. Patient is sexually active. Contraception method is IUD and Mirena IUD placed following C/S at [facility] prior to hosp discharge.   Postpartum depression screening: 0    The following portions of the patient's history were reviewed and updated as appropriate: Allergies, medications, substance use.   Last pap smear done 11/18/2018  and was Abnormal - ASCUS cannot rule out high grade, HRHPV pos  Review of Systems Pertinent items noted in HPI and remainder of comprehensive ROS otherwise negative.    Objective:  BP (!) 146/79   Ht 4\' 11"  (1.499 m)   Wt 155 lb 3.2 oz (70.4 kg)   LMP 12/04/2019 (LMP Unknown)   BMI 31.35 kg/m  Pt states had some vag bleeding very short beg 10/2020.  Gen: well appearing, NAD. Some pressured speech. HEENT: no scleral icterus CV: RR Lung: Normal WOB Breast:performed-yes  Ext: warm well perfused Abd: well-headed, nontender horiz suprapubic scar GU: External genitalia WNL. Vagina without lesion or discharge. Cervix NT, no discharge or lesion. No IUD strings seen. Cytobrush used to attempt to locate/withdraw strings - no strings seen. Rectal: performed -  not indicated       Assessment:    Postpartum exam complete. Pap smear done at today's visit.    Plan:   Essential components of care per ACOG recommendations for Comprehensive Postpartum exam:  1.  Mood and well being: Patient with negative depression screening today. Reviewed local resources for support and enc pt to resume psychiatric care and meds at [facility]. EPDS is low risk. Reviewed resources and that mood sx in first year after pregnancy are considered related to pregnancy and to reach out for help at [facility] if needed. Discussed [facility] as link to care and availability of LCSW for counseling  - Patient does use tobacco. Discussed reduction and cessation - hx of drug use? Yes  discussed support systems in place.   2. Infant care and feeding:  -Patient currently breastmilk feeding? No Does not currently have custody of infant. DSS involved.  3. Sexuality, contraception and birth spacing  Contraception: Contraception counseling: Reviewed all forms of birth control options in the tiered based approach. available including abstinence; over the counter/barrier methods; hormonal contraceptive medication including pill, patch, ring, injection,contraceptive implant; hormonal and nonhormonal IUDs; permanent sterilization options including vasectomy and the various tubal sterilization modalities. Risks, benefits, and typical effectiveness rates were reviewed.  Questions were answered.  Written information was also given to the patient to review.  Patient desires IUD removal, this was attempted, but unsuccessful (could not locate IUD strings). Refer for pelvic [facility] to determine IUD presence/location. Pt should consider herself at risk for pregnancy for now, enc abstinence/condoms - pt declines.Emphasized use of condoms 100% of the time for STI prevention.  - Patient does not want a pregnancy in the  next year.  Desired family size is 3 children.  - Reviewed forms of contraception in tiered fashion. Patient desired ? IUD today.   - Discussed birth spacing of 18 months  4. Sleep and fatigue -Encouraged  family/partner/community support of 4 hrs of uninterrupted sleep to help with mood and fatigue  5. Physical Recovery  - Discussed patients delivery and complications - Patient has urinary incontinence? No  - Patient is safe to resume physical and sexual activity  1. Postpartum exam  - Pregnancy, urine - HIV [facility] - Syphilis Serology, [facility] - Chlamydia/Gonorrhea [facility] - WET PREP FOR TRICH, YEAST, CLUE - IGP, Aptima HPV  2. HSIL on Pap smear of cervix Subsequent testing in 2019 [facility]/HPV+. Co-testing done today.  3. Gestational diabetes mellitus (GDM) in third trimester, gestational diabetes method of control unspecified Pt not fasting today. Return for 2h OGTT.   Patient given handout about PCP care in the community Given MVI per family planning program guidelines and availability  Follow up in: 2 week or as needed.

## 2020-11-25 NOTE — Progress Notes (Signed)
Scheduled pt for 2 hr gtt lab on 11/30/20. Pt declined all forms of birth control including condoms. Pt to follow-up with her PCP and understands that she will receive a phone call for U/S scheduling (U/S order/referral to be completed by provider). Provider orders completed.

## 2020-11-25 NOTE — Progress Notes (Signed)
Patient here for PP appointment. Had C-section at [facility] on 08/20/2020. Also wants IUD removed today due to pain, it was inserted following birth of baby in 8/21. Unsure what BCM to use after IUD removal. Last Pap printed from [facility] for provider.Burt Knack, RN

## 2020-11-26 ENCOUNTER — Encounter: Payer: Self-pay | Admitting: Physician Assistant

## 2020-11-26 NOTE — Progress Notes (Signed)
RN submitted order for transvaginal ultrasound per provider orders.   Harvie Heck, RN

## 2020-11-28 ENCOUNTER — Encounter: Payer: Self-pay | Admitting: Physician Assistant

## 2020-11-29 ENCOUNTER — Other Ambulatory Visit: Payer: Self-pay | Admitting: Family Medicine

## 2020-11-30 ENCOUNTER — Other Ambulatory Visit: Payer: Self-pay

## 2020-12-01 ENCOUNTER — Other Ambulatory Visit: Payer: Self-pay | Admitting: Physician Assistant

## 2020-12-01 ENCOUNTER — Telehealth: Payer: Self-pay

## 2020-12-01 DIAGNOSIS — T8332XA Displacement of intrauterine contraceptive device, initial encounter: Secondary | ICD-10-CM

## 2020-12-01 LAB — IGP, APTIMA HPV
HPV Aptima: POSITIVE — AB
PAP Smear Comment: 0

## 2020-12-01 NOTE — Telephone Encounter (Signed)
Telephone call to patient today regarding her PAP results and the need for a Colpo referral.  Left a message to return my call.  Berit Raczkowski, RN  

## 2020-12-07 ENCOUNTER — Telehealth: Payer: Self-pay | Admitting: Family Medicine

## 2020-12-07 NOTE — Telephone Encounter (Signed)
Received call back from pt. Pt states she can hardly stand up at times and is doubled over in pain.  Pt states she will be proceeding to ER for evaluation and will tell them about exam and referral by [facility].

## 2020-12-07 NOTE — Telephone Encounter (Signed)
Pt states that she need a referral for an ultrasound. She states that her IUD could not be found at her PP visit. She states that she is in a lot of  pain,

## 2020-12-07 NOTE — Telephone Encounter (Cosign Needed)
Consulted with provider, Wendi Snipes, FNP, regarding pt message and recent hx of referral due to unable to locate IUD. Per provider, have pt proceed to ER for evaluation.  Phone call to pt. Left message on voicemail to proceed to ER since she is in a lot of pain so she can be evaluated there, please tell them about recent visit to [facility] and unable to locate IUD and also the referral for U/S. Can call us back at [phone].

## 2020-12-14 ENCOUNTER — Emergency Department: Payer: Medicaid Other

## 2020-12-14 ENCOUNTER — Emergency Department
Admission: EM | Admit: 2020-12-14 | Discharge: 2020-12-14 | Disposition: A | Discharge status: Home / Self Care | Payer: Medicaid Other | Attending: Emergency Medicine | Admitting: Emergency Medicine

## 2020-12-14 ENCOUNTER — Other Ambulatory Visit: Payer: Self-pay

## 2020-12-14 DIAGNOSIS — Z87891 Personal history of nicotine dependence: Secondary | ICD-10-CM | POA: Diagnosis not present

## 2020-12-14 DIAGNOSIS — I10 Essential (primary) hypertension: Secondary | ICD-10-CM | POA: Insufficient documentation

## 2020-12-14 DIAGNOSIS — Z91010 Allergy to peanuts: Secondary | ICD-10-CM | POA: Insufficient documentation

## 2020-12-14 DIAGNOSIS — T8332XA Displacement of intrauterine contraceptive device, initial encounter: Secondary | ICD-10-CM | POA: Diagnosis not present

## 2020-12-14 DIAGNOSIS — R102 Pelvic and perineal pain: Secondary | ICD-10-CM | POA: Insufficient documentation

## 2020-12-14 LAB — COMPREHENSIVE METABOLIC PANEL
ALT: 8 U/L (ref 0–44)
AST: 12 U/L — ABNORMAL LOW (ref 15–41)
Albumin: 3.9 g/dL (ref 3.5–5.0)
Alkaline Phosphatase: 55 U/L (ref 38–126)
Anion gap: 9 (ref 5–15)
BUN: 11 mg/dL (ref 6–20)
CO2: 25 mmol/L (ref 22–32)
Calcium: 9.1 mg/dL (ref 8.9–10.3)
Chloride: 105 mmol/L (ref 98–111)
Creatinine, Ser: 0.69 mg/dL (ref 0.44–1.00)
GFR, Estimated: >60 mL/min (ref >60)
Glucose, Bld: 84 mg/dL (ref 70–99)
Potassium: 4.4 mmol/L (ref 3.5–5.1)
Sodium: 139 mmol/L (ref 135–145)
Total Bilirubin: 0.8 mg/dL (ref 0.3–1.2)
Total Protein: 7.1 g/dL (ref 6.5–8.1)

## 2020-12-14 LAB — URINALYSIS, COMPLETE (UACMP) WITH MICROSCOPIC
Bacteria, UA: NONE SEEN
Bilirubin Urine: NEGATIVE
Glucose, UA: NEGATIVE mg/dL
Ketones, ur: NEGATIVE mg/dL
Leukocytes,Ua: NEGATIVE
Nitrite: NEGATIVE
Protein, ur: NEGATIVE mg/dL
Specific Gravity, Urine: 1.021 (ref 1.005–1.030)
pH: 5 (ref 5.0–8.0)

## 2020-12-14 LAB — CBC
HCT: 40.8 % (ref 36.0–46.0)
Hemoglobin: 13.2 g/dL (ref 12.0–15.0)
MCH: 29.5 pg (ref 26.0–34.0)
MCHC: 32.4 g/dL (ref 30.0–36.0)
MCV: 91.3 fL (ref 80.0–100.0)
Platelets: 188 10*3/uL (ref 150–400)
RBC: 4.47 MIL/uL (ref 3.87–5.11)
RDW: 13.8 % (ref 11.5–15.5)
WBC: 5.5 10*3/uL (ref 4.0–10.5)
nRBC: 0 % (ref 0.0–0.2)

## 2020-12-14 LAB — POC URINE PREG, ED: Preg Test, Ur: NEGATIVE

## 2020-12-14 LAB — LIPASE, BLOOD: Lipase: 24 U/L (ref 11–51)

## 2020-12-14 MED ORDER — IBUPROFEN 800 MG PO TABS: 800.0000 mg | ORAL_TABLET | Freq: Three times a day (TID) | ORAL | 0 refills | Status: DC | PRN

## 2020-12-14 MED ORDER — KETOROLAC TROMETHAMINE 30 MG/ML IJ SOLN: 30.0000 mg | Freq: Once | INTRAMUSCULAR | Status: DC

## 2020-12-14 MED ORDER — HYDROCODONE-ACETAMINOPHEN 5-325 MG PO TABS
1.0000 | ORAL_TABLET | Freq: Once | ORAL | Status: AC
  Administered 2020-12-14: 14:00:00 1 via ORAL
  Filled 2020-12-14: qty 1

## 2020-12-14 MED ORDER — IBUPROFEN 800 MG PO TABS
800.0000 mg | ORAL_TABLET | Freq: Once | ORAL | Status: AC
  Administered 2020-12-14: 17:00:00 800 mg via ORAL
  Filled 2020-12-14: qty 1

## 2020-12-14 NOTE — ED Triage Notes (Signed)
Pt comes via POV from home with c/o abdominal pain for few weeks. Pt states 10/10 pain. Pt states no N/V/D.  Pt denies any urinary symptoms.

## 2020-12-14 NOTE — ED Provider Notes (Signed)
[facility] Emergency Department Provider Note ____________________________________________  Time seen: 1350  I have reviewed the triage vital signs and the nursing notes.  HISTORY  Chief Complaint  Abdominal Pain   HPI [name] is a 34 y.o. female, G7P6, presents herself to the ED for evaluation of several months complaint of abdominopelvic pain. She had an IUD placed in August, peri-operatively, following her 6th c-section. She reports reluctantly having the IUD placed, and has had intermittent symptoms since that time. She reports normal menstrual periods without menorrhagia/metrorrhagia. She denies fevers, chest pain, SOB, nausea, vomiting, or diarrhea. She reports generalized abdominal pain and bilateral pelvic pain. She was evaluated by her GYN provider on 12/2 and reports her IUD strings were no visualized on physical exam. She was apparently to be scheduled for an [place], and presents today for evaluation.   Past Medical History:  Diagnosis Date  . Anxiety   . Depression   . Gestational diabetes   . History of substance abuse (HCC)   . History of suicide attempt   . History of thyroid disease   . Hypertension   . Thyroid disease     Patient Active Problem List   Diagnosis Date Noted  . Supervision of high risk pregnancy, antepartum 07/23/2020  . Substance abuse complicating pregnancy, antepartum 07/23/2020  . Bipolar disease in pregnancy, unspecified trimester (HCC) 07/23/2020  . Genital herpes affecting pregnancy 07/23/2020  . History of prior pregnancy with IUGR newborn 07/23/2020  . Asthma affecting pregnancy, antepartum 07/23/2020  . History of fetal anomaly in prior pregnancy, currently pregnant 07/23/2020  . MDD (major depressive disorder), recurrent severe, without psychosis (HCC) 07/18/2020  . Major depressive disorder, recurrent severe without psychotic features (HCC) 07/15/2020  . Severe recurrent major depression without  psychotic features (HCC) 06/03/2020  . History of cesarean delivery 05/28/2020  . Bipolar disorder, curr episode mixed, severe, with psychotic features (HCC)   . MDD (major depressive disorder), recurrent episode, severe (HCC) 04/08/2020  . Thoughts of self harm 02/01/2020  . Cocaine use disorder, moderate, dependence (HCC) 01/01/2018  . Cannabis use disorder, moderate, dependence (HCC) 01/01/2018  . Pregnant 12/31/2017  . Medically noncompliant 12/31/2017  . Tobacco use disorder 01/12/2016  . Stimulant use disorder (HCC) (cocaine) 01/12/2016  . Alcohol use disorder, moderate, dependence (HCC) 01/12/2016  . Self-inflicted laceration of wrist (HCC) 01/11/2016  . HSIL on Pap smear of cervix 05/17/2015    Past Surgical History:  Procedure Laterality Date  . CESAREAN SECTION  2000, 2002, 2013  . WISDOM TOOTH EXTRACTION      Prior to Admission medications   Medication Sig Start Date End Date Taking? Authorizing Provider  acetaminophen (TYLENOL) 325 MG tablet Take 650 mg by mouth every 6 (six) hours as needed for pain. Patient not taking: Reported on 11/25/2020 08/24/20   [provider]  albuterol (VENTOLIN HFA) 108 (90 Base) MCG/ACT inhaler Inhale 2 puffs into the lungs every 6 (six) hours as needed for shortness of breath or wheezing. Patient not taking: Reported on 11/25/2020 08/19/20 08/19/21  [provider]  ferrous sulfate 325 (65 FE) MG tablet Take 325 mg by mouth daily. Patient not taking: Reported on 11/25/2020 08/24/20 08/24/21  [provider]  ibuprofen (ADVIL) 800 MG tablet Take 1 tablet (800 mg total) by mouth every 8 (eight) hours as needed. 12/14/20   Kaylor Simenson, Charlesetta Ivory, PA-C  QUEtiapine (SEROQUEL) 100 MG tablet Take 100 mg by mouth at bedtime. Patient not taking: Reported on 11/25/2020  [provider]  QUEtiapine (SEROQUEL) 25 MG tablet Take 25 mg by mouth every 6 (six) hours as needed. Patient not taking: Reported on 11/25/2020     [provider]  sertraline (ZOLOFT) 50 MG tablet Take 50 mg by mouth at bedtime.  Patient not taking: Reported on 11/25/2020    [provider]  traMADol (ULTRAM) 50 MG tablet Take 1 tablet (50 mg total) by mouth every 6 (six) hours as needed. Patient not taking: Reported on 11/25/2020 08/26/20   Kem Boroughs B, FNP    Allergies Aspirin, Banana, Other, Peanut oil, Peanut-containing drug products, and Pecan nut (diagnostic)  Family History  Problem Relation Age of Onset  . Diabetes Mother     Social History Social History   Tobacco Use  . Smoking status: Former Smoker    Packs/day: 0.50  . Smokeless tobacco: Never Used  Substance Use Topics  . Alcohol use: Not Currently    Comment: social drinker  . Drug use: Not Currently    Types: Cocaine, Marijuana    Comment: Positive UDS 12/2017, last used mj 3 days ago , last used cocaine  a couple of months ago     Review of Systems  Constitutional: Negative for fever. Cardiovascular: Negative for chest pain. Respiratory: Negative for shortness of breath. Gastrointestinal: Positive for abdominal pain, Denies nausea, vomiting and diarrhea. Genitourinary: Negative for dysuria or vaginal discharge. Reports pelvic pain. Musculoskeletal: Negative for back pain. Skin: Negative for rash. Neurological: Negative for headaches, focal weakness or numbness. ____________________________________________  PHYSICAL EXAM:  VITAL SIGNS: ED Triage Vitals  Enc Vitals Group     BP 12/14/20 1147 (!) 171/90     Pulse Rate 12/14/20 1147 68     Resp 12/14/20 1147 18     Temp 12/14/20 1147 99.2 F (37.3 C)     Temp src --      SpO2 12/14/20 1147 100 %     Weight --      Height --      Head Circumference --      Peak Flow --      Pain Score 12/14/20 1343 6     Pain Loc --      Pain Edu? --      Excl. in GC? --     Constitutional: Alert and oriented. Well appearing and in no distress. Head: Normocephalic and  atraumatic. Eyes: Conjunctivae are normal. Normal extraocular movements Hematological/Lymphatic/Immunological: No cervical lymphadenopathy. Cardiovascular: Normal rate, regular rhythm. Normal distal pulses. Respiratory: Normal respiratory effort. No wheezes/rales/rhonchi. Gastrointestinal: Soft and nontender. No distention, rebound, guarding, or rigidity.  Active bowel sounds appreciated.  No CVA tenderness elicited. GU: deferred by patient Musculoskeletal: Nontender with normal range of motion in all extremities.  Neurologic:  Normal gait without ataxia. Normal speech and language. No gross focal neurologic deficits are appreciated. Skin:  Skin is warm, dry and intact. No rash noted. Psychiatric: Mood and affect are normal. Patient exhibits appropriate insight and judgment. ____________________________________________   LABS (pertinent positives/negatives)  Labs Reviewed  COMPREHENSIVE METABOLIC PANEL - Abnormal; Notable for the following components:      Result Value   AST 12 (*)    All other components within normal limits  URINALYSIS, COMPLETE (UACMP) WITH MICROSCOPIC - Abnormal; Notable for the following components:   Color, Urine YELLOW (*)    APPearance CLEAR (*)    Hgb urine dipstick SMALL (*)    All other components within normal limits  LIPASE, BLOOD  CBC  POC URINE PREG, ED  ____________________________________________   RADIOLOGY  US Pelvic Complete w/ Transvaginal  IMPRESSION:  IUD identified within endometrial canal at the mid to lower uterine segments.  Small subserosal leiomyoma at anterior upper to mid uterus.  Caesarean section scar with few small calcifications.  Unremarkable ovaries and adnexa. ____________________________________________  PROCEDURES  Norco 5-325 mg PO IBU 800 mg PO  Procedures ____________________________________________  INITIAL IMPRESSION / ASSESSMENT AND PLAN / ED COURSE  DDX: IUD migration, nonspecific pelvic pain,  ovarian cyst, IUP  Patient with ED evaluation of several months of pelvic pain, previously evaluated by her OB provider.  She was voicing concern of a possible migrated IUD after the strings were not visualized on her most recent pelvic exam 2 weeks ago.  Patient was found to have a benign exam and normal reassuring labs.  Ultrasound did confirm endometrial placement of her IUD, and an incidental leiomyoma.  Patient will be discharged at this time to follow-up with her primary provider for ongoing symptoms.  She is discharged with a prescription for ibuprofen to take as needed.  Return precautions have been discussed.  [name] was evaluated in Emergency Department on 12/14/2020 for the symptoms described in the history of present illness. She was evaluated in the context of the global COVID-19 pandemic, which necessitated consideration that the patient might be at risk for infection with the SARS-CoV-2 virus that causes COVID-19. Institutional protocols and algorithms that pertain to the evaluation of patients at risk for COVID-19 are in a state of rapid change based on information released by regulatory bodies including the CDC and federal and state organizations. These policies and algorithms were followed during the patient's care in the ED. ____________________________________________  FINAL CLINICAL IMPRESSION(S) / ED DIAGNOSES  Final diagnoses:  IUD migration  Pelvic pain      Karmen Stabs, Charlesetta Ivory, PA-C 12/14/20 1747    Sharman Cheek, MD 12/15/20 1530

## 2020-12-14 NOTE — Discharge Instructions (Addendum)
Your exam, labs, and ultrasound are normal at this time.  Your IUD has been confirmed to be in place in the uterus.  You should follow-up with your OB/GYN provider for further management of your pelvic pain symptoms.  Take the prescription pain medicine as needed.

## 2020-12-21 ENCOUNTER — Ambulatory Visit (LOCAL_COMMUNITY_HEALTH_CENTER): Payer: Medicaid Other | Admitting: Physician Assistant

## 2020-12-21 ENCOUNTER — Other Ambulatory Visit: Payer: Self-pay

## 2020-12-21 VITALS — BP 147/84 | Ht 60.0 in | Wt 154.0 lb

## 2020-12-21 DIAGNOSIS — Z3009 Encounter for other general counseling and advice on contraception: Secondary | ICD-10-CM

## 2020-12-21 DIAGNOSIS — Z30432 Encounter for removal of intrauterine contraceptive device: Secondary | ICD-10-CM | POA: Diagnosis not present

## 2020-12-21 NOTE — Progress Notes (Signed)
[facility] problem visit  [facility]  Subjective:  [name] is a 34 y.o. being seen today requesting IUD removal today.  Chief Complaint  Patient presents with  . Contraception    IUD removal    HPI  Patient states that she is here for an IUD removal.  Patient complains of irregular/heavy bleeding and abdominal pain since the IUD was placed.  Patient states that she does not "want to be a [place] pig for birth control" again.  States that she does not want to use hormonal methods and does not desire a pregnancy either.     Does the patient have a current or past history of drug use? No   No components found for: HCV]   Health Maintenance Due  Topic Date Due  . COVID-19 Vaccine (1) Never done  . INFLUENZA VACCINE  07/25/2020    Review of Systems  All other systems reviewed and are negative.   The following portions of the patient's history were reviewed and updated as appropriate: allergies, current medications, past family history, past medical history, past social history, past surgical history and problem list. Problem list updated.   See flowsheet for other program required questions.  Objective:   Vitals:   12/21/20 1347  BP: (!) 147/84  Weight: 154 lb (69.9 kg)  Height: 5' (1.524 m)    Physical Exam Vitals and nursing note reviewed.  Constitutional:      General: She is not in acute distress.    Appearance: Normal appearance.  HENT:     Head: Normocephalic and atraumatic.  Eyes:     Conjunctiva/sclera: Conjunctivae normal.  Pulmonary:     Effort: Pulmonary effort is normal.  Abdominal:     Palpations: Abdomen is soft. There is no mass.     Tenderness: There is no abdominal tenderness. There is no guarding or rebound.  Genitourinary:    General: Normal vulva.     Rectum: Normal.     Comments: External genitalia/pubic area without nits, lice, edema, erythema, lesions and inguinal adenopathy. Vagina  with normal mucosa and discharge. Cervix without visible lesions.  IUD strings visible just inside cervical os. Uterus firm, mobile, nt, no masses, no CMT, no adnexal tenderness or fullness. Skin:    General: Skin is warm and dry.  Neurological:     Mental Status: She is alert and oriented to person, place, and time.  Psychiatric:        Mood and Affect: Mood normal.        Behavior: Behavior normal.        Thought Content: Thought content normal.        Judgment: Judgment normal.       Assessment and Plan:  [name] is a 34 y.o. female presenting to the [facility] Department for a Women's Health problem visit  1. Encounter for counseling regarding contraception Counseled patient that she is likely having some of the irregular bleeding due to recent IUD placement and that it is a normal SE of there Mirena IUD. Counseled patient about other options for birth control and of the risks to her and a child if she were to get pregnant again due to her having had multiple C-sections (including uterine rupture, bleeding, placental abruption, etc.). Counseled patient that if she insists on not using a hormonal method, she should use condoms always and that there are OTC spermicides that she can get to use along with the  condoms for better effectiveness. Answered patient questions re: small fibroid seen on ultrasound done at the ER.  Counseled patient that she should follow up with PCP/Gyn to monitor the fibroid. RN gave patient PCP list and [facility] info for patient to follow up re:  HTN and other chronic conditions and for help with her medicines. Enc patient to RTC for hormonal BCM if she changes her mind.  2. Encounter for IUD removal   IUD Removal  Patient identified, informed consent performed, consent signed.  Patient was in the dorsal lithotomy position, normal external genitalia was noted.  A speculum was placed in the patient's vagina, normal discharge was noted,  no lesions. The cervix was visualized, no lesions, no abnormal discharge.  The strings of the IUD were visualized at the os and used and IUD hook to bring them down and out of the os.  They were then  grasped and pulled using ring forceps. The IUD was removed in its entirety.  Patient tolerated the procedure well.    Patient will use condoms/barrier for contraception.      Return in about 1 year (around 12/21/2021) for for RP and prn.  No future appointments.  Matt Holmes, PA

## 2020-12-21 NOTE — Progress Notes (Signed)
Patient here for IUD removal. Patient recently seen at ED, imagine shown IUD placement in endometrial. Patient not desiring birth control at the time but also not desiring pregnancy.   Harvie Heck, RN

## 2020-12-23 ENCOUNTER — Telehealth: Payer: Self-pay | Admitting: Family Medicine

## 2020-12-23 NOTE — Telephone Encounter (Signed)
RN returned call. Patient reports bleeding after 1 day of having IUD removed. Patient reports she has had to wear a pad, denies cramping or using multiple pads a day. RN assured patient she may have some bleeding after IUD removal and to give it a week, if it has not slowed down to call back. If over the weekend she is filling a pad less that a hour or 30 mins to go to urgent care. Patient verbalizes understanding of plan.   Harvie Heck, RN

## 2020-12-23 NOTE — Telephone Encounter (Signed)
Please call me I am having bleeding

## 2021-01-05 ENCOUNTER — Telehealth: Payer: Self-pay

## 2021-01-05 NOTE — Telephone Encounter (Signed)
Telephone call to patient today regarding her PAP results and the need for a Colpo.  No reponse to her PAP card nor last message left. Hart Carwin, RN

## 2021-01-17 ENCOUNTER — Encounter: Payer: Self-pay | Admitting: Family Medicine

## 2021-02-03 NOTE — Telephone Encounter (Signed)
Colpo referral needed due to an abnormal PAP 11-25-2020 of ASCUS and + HPV.  No response to PAP card mailed and voicemail left on 12/01/2020.  No response to voicemail left on 01/05/2021.  Certified Letter mailed today [phone]  [phone]  [phone]  [phone].  Hart Carwin, RN

## 2021-03-01 NOTE — Progress Notes (Signed)
Patient had abnormal PAP on 11/25/2020 ASCUS and + HPV.  Colpo referral needed.  No response to messages, PAP card mailed, nor Certified Letter mailed.  Close to PAP Follow-Up until patient initiates contact with [facility]. Hart Carwin, RN

## 2021-03-03 ENCOUNTER — Emergency Department
Admission: EM | Admit: 2021-03-03 | Discharge: 2021-03-03 | Discharge status: Against Medical Advice | Payer: No Typology Code available for payment source | Attending: Emergency Medicine | Admitting: Emergency Medicine

## 2021-03-03 ENCOUNTER — Encounter: Payer: Self-pay | Admitting: Emergency Medicine

## 2021-03-03 ENCOUNTER — Other Ambulatory Visit: Payer: Self-pay

## 2021-03-03 DIAGNOSIS — I10 Essential (primary) hypertension: Secondary | ICD-10-CM | POA: Insufficient documentation

## 2021-03-03 DIAGNOSIS — Z9151 Personal history of suicidal behavior: Secondary | ICD-10-CM | POA: Insufficient documentation

## 2021-03-03 DIAGNOSIS — F32A Depression, unspecified: Secondary | ICD-10-CM | POA: Diagnosis not present

## 2021-03-03 DIAGNOSIS — Z87891 Personal history of nicotine dependence: Secondary | ICD-10-CM | POA: Diagnosis not present

## 2021-03-03 DIAGNOSIS — Z91010 Allergy to peanuts: Secondary | ICD-10-CM | POA: Diagnosis not present

## 2021-03-03 DIAGNOSIS — F419 Anxiety disorder, unspecified: Secondary | ICD-10-CM | POA: Insufficient documentation

## 2021-03-03 DIAGNOSIS — J45909 Unspecified asthma, uncomplicated: Secondary | ICD-10-CM | POA: Insufficient documentation

## 2021-03-03 LAB — URINE DRUG SCREEN, QUALITATIVE (ARMC ONLY)
Amphetamines, Ur Screen: NOT DETECTED
Barbiturates, Ur Screen: NOT DETECTED
Benzodiazepine, Ur Scrn: NOT DETECTED
Cannabinoid 50 Ng, Ur ~~LOC~~: POSITIVE — AB
Cocaine Metabolite,Ur ~~LOC~~: POSITIVE — AB
MDMA (Ecstasy)Ur Screen: NOT DETECTED
Methadone Scn, Ur: NOT DETECTED
Opiate, Ur Screen: NOT DETECTED
Phencyclidine (PCP) Ur S: NOT DETECTED
Tricyclic, Ur Screen: NOT DETECTED

## 2021-03-03 LAB — URINALYSIS, COMPLETE (UACMP) WITH MICROSCOPIC
Bacteria, UA: NONE SEEN
Bilirubin Urine: NEGATIVE
Glucose, UA: NEGATIVE mg/dL
Hgb urine dipstick: NEGATIVE
Ketones, ur: NEGATIVE mg/dL
Nitrite: NEGATIVE
Protein, ur: NEGATIVE mg/dL
Specific Gravity, Urine: 1.024 (ref 1.005–1.030)
pH: 5 (ref 5.0–8.0)

## 2021-03-03 LAB — POC URINE PREG, ED: Preg Test, Ur: NEGATIVE

## 2021-03-03 MED ORDER — HYDROXYZINE HCL 25 MG PO TABS
25.0000 mg | ORAL_TABLET | Freq: Once | ORAL | Status: DC
  Filled 2021-03-03: qty 1

## 2021-03-03 NOTE — ED Notes (Signed)
Pt given clothes. Pt removed hospital clothing in hallway. When asked to go into bathroom to change, pt states "If you don't like it then go somewhere else". Pt continues to change into her clothing in hallway.

## 2021-03-03 NOTE — ED Notes (Addendum)
Pt handed vistaril to take for anxiety after requesting medication and proceeds to throw it into the floor and states she is not taking it.

## 2021-03-03 NOTE — ED Triage Notes (Signed)
Patient ambulatory to triage with steady gait, without difficulty or distress noted; pt reports thoughts of hurting self; presents with superficial scratches to inner left wrist

## 2021-03-03 NOTE — ED Notes (Addendum)
With this nurse and EDT Ariel present, pt removes black dress, grey coat, black slides, black hair bonnet, 2 cell phone, pack of cigarettes, visa debit card--all placed in labeled pt belonging bag to be secured on nursing unit and pt changed into burgandy scrubs

## 2021-03-03 NOTE — ED Notes (Signed)
Pt repeated yelling "let me go".

## 2021-03-03 NOTE — ED Provider Notes (Signed)
[facility] Emergency Department Provider Note   ____________________________________________   I have reviewed the triage vital signs and the nursing notes.   HISTORY  Chief Complaint Anxiety  History limited by: Not Limited   HPI [name] is a 35 y.o. female who presents to the emergency department today because of concerns for anxiety.  Patient states that she has felt anxious for most of her life.  She states that she is been to the emergency department in the past for this.  She denies that anything happened that made her anxiety worse that made her want to come in tonight.  Additionally she states that she does have some depression.  She states that she did cut herself a few days ago.  Records reviewed. Per medical record review patient has a history of depression, substance abuse.   Past Medical History:  Diagnosis Date  . Anxiety   . Depression   . Gestational diabetes   . History of substance abuse (HCC)   . History of suicide attempt   . History of thyroid disease   . Hypertension   . Thyroid disease     Patient Active Problem List   Diagnosis Date Noted  . Supervision of high risk pregnancy, antepartum 07/23/2020  . Substance abuse complicating pregnancy, antepartum 07/23/2020  . Bipolar disease in pregnancy, unspecified trimester (HCC) 07/23/2020  . Genital herpes affecting pregnancy 07/23/2020  . History of prior pregnancy with IUGR newborn 07/23/2020  . Asthma affecting pregnancy, antepartum 07/23/2020  . History of fetal anomaly in prior pregnancy, currently pregnant 07/23/2020  . MDD (major depressive disorder), recurrent severe, without psychosis (HCC) 07/18/2020  . Major depressive disorder, recurrent severe without psychotic features (HCC) 07/15/2020  . Severe recurrent major depression without psychotic features (HCC) 06/03/2020  . History of cesarean delivery 05/28/2020  . Bipolar disorder, curr episode mixed, severe,  with psychotic features (HCC)   . MDD (major depressive disorder), recurrent episode, severe (HCC) 04/08/2020  . Thoughts of self harm 02/01/2020  . Cocaine use disorder, moderate, dependence (HCC) 01/01/2018  . Cannabis use disorder, moderate, dependence (HCC) 01/01/2018  . Pregnant 12/31/2017  . Medically noncompliant 12/31/2017  . Tobacco use disorder 01/12/2016  . Stimulant use disorder (HCC) (cocaine) 01/12/2016  . Alcohol use disorder, moderate, dependence (HCC) 01/12/2016  . Self-inflicted laceration of wrist (HCC) 01/11/2016  . HSIL on Pap smear of cervix 05/17/2015    Past Surgical History:  Procedure Laterality Date  . CESAREAN SECTION  2000, 2002, 2013  . WISDOM TOOTH EXTRACTION      Prior to Admission medications   Medication Sig Start Date End Date Taking? Authorizing Provider  acetaminophen (TYLENOL) 325 MG tablet Take 650 mg by mouth every 6 (six) hours as needed for pain. 08/24/20   [provider]  albuterol (VENTOLIN HFA) 108 (90 Base) MCG/ACT inhaler Inhale 2 puffs into the lungs every 6 (six) hours as needed for shortness of breath or wheezing. 08/19/20 08/19/21  [provider]  ferrous sulfate 325 (65 FE) MG tablet Take 325 mg by mouth daily. Patient not taking: No sig reported 08/24/20 08/24/21  [provider]  ibuprofen (ADVIL) 800 MG tablet Take 1 tablet (800 mg total) by mouth every 8 (eight) hours as needed. 12/14/20   Menshew, Charlesetta Ivory, PA-C  QUEtiapine (SEROQUEL) 100 MG tablet Take 100 mg by mouth at bedtime. Patient not taking: No sig reported    [provider]  QUEtiapine (SEROQUEL) 25 MG tablet Take  25 mg by mouth every 6 (six) hours as needed. Patient not taking: No sig reported    [provider]  sertraline (ZOLOFT) 50 MG tablet Take 50 mg by mouth at bedtime.  Patient not taking: No sig reported    [provider]  traMADol (ULTRAM) 50 MG tablet Take 1 tablet (50 mg total) by mouth every 6  (six) hours as needed. Patient not taking: No sig reported 08/26/20   Kem Boroughs B, FNP    Allergies Aspirin, Banana, Other, Peanut oil, Peanut-containing drug products, and Pecan nut (diagnostic)  Family History  Problem Relation Age of Onset  . Diabetes Mother     Social History Social History   Tobacco Use  . Smoking status: Former Smoker    Packs/day: 0.50  . Smokeless tobacco: Never Used  Substance Use Topics  . Alcohol use: Not Currently    Comment: social drinker  . Drug use: Not Currently    Types: Cocaine, Marijuana    Comment: Positive UDS 12/2017, last used mj 3 days ago , last used cocaine  a couple of months ago     Review of Systems Constitutional: No fever/chills Eyes: No visual changes. ENT: No sore throat. Cardiovascular: Denies chest pain. Respiratory: Denies shortness of breath. Gastrointestinal: No abdominal pain.  No nausea, no vomiting.  No diarrhea.   Genitourinary: Negative for dysuria. Musculoskeletal: Negative for back pain. Skin: Positive for cuts to her left forearm.  Neurological: Negative for headaches, focal weakness or numbness.  ____________________________________________   PHYSICAL EXAM:  VITAL SIGNS: ED Triage Vitals  Enc Vitals Group     BP 03/03/21 0349 (!) 185/101     Pulse Rate 03/03/21 0349 80     Resp 03/03/21 0349 18     Temp 03/03/21 0349 98 F (36.7 C)     Temp Source 03/03/21 0349 Oral     SpO2 03/03/21 0349 99 %     Weight 03/03/21 0348 160 lb (72.6 kg)     Height 03/03/21 0348 4\' 11"  (1.499 m)     Head Circumference --      Peak Flow --      Pain Score 03/03/21 0348 0   Constitutional: Alert and oriented.  Eyes: Conjunctivae are normal.  ENT      Head: Normocephalic and atraumatic.      Nose: No congestion/rhinnorhea.      Mouth/Throat: Mucous membranes are moist.      Neck: No stridor. Hematological/Lymphatic/Immunilogical: No cervical lymphadenopathy. Cardiovascular: Normal rate, regular rhythm.  No  murmurs, rubs, or gallops.  Respiratory: Normal respiratory effort without tachypnea nor retractions. Breath sounds are clear and equal bilaterally. No wheezes/rales/rhonchi. Gastrointestinal: Soft and non tender. No rebound. No guarding.  Genitourinary: Deferred Musculoskeletal: Normal range of motion in all extremities. No lower extremity edema. Neurologic:  Normal speech and language. No gross focal neurologic deficits are appreciated.  Skin:  Superficial and healing lacerations to the left forearm Psychiatric: Upset.  ____________________________________________    LABS (pertinent positives/negatives)  UA hazy, trace leukocytes, 0-5 rbc and wbc Upreg negative UDS positive cocaine, positive cannabinoid  ____________________________________________   EKG  None  ____________________________________________    RADIOLOGY  None  ____________________________________________   PROCEDURES  Procedures  ____________________________________________   INITIAL IMPRESSION / ASSESSMENT AND PLAN / ED COURSE  Pertinent labs & imaging results that were available during my care of the patient were reviewed by me and considered in my medical decision making (see chart for details).   Patient  presented to the emergency department today with primary concerns for anxiety.  Patient also stated that she has felt depressed.  She states she has felt this way for most of her life.  She denies that anything change that made her want to come in tonight.  She did state that she cut herself a few days ago.  On exam she does have some superficial and healing lacerations to the left forearm.  She denied any thoughts about wanting to harm herself tonight.  I did offer to have the patient be seen by psychiatry but she stated she did not see how this would help.  Additionally I offered and ordered for her to receive medication to help with her anxiety.  The patient apparently stated to staff that she  wanted to leave.  I was not able to talk to the patient before she left.  ____________________________________________   FINAL CLINICAL IMPRESSION(S) / ED DIAGNOSES  Final diagnoses:  Anxiety     Note: This dictation was prepared with Dragon dictation. Any transcriptional errors that result from this process are unintentional     Phineas Semen, MD 03/03/21 [phone]

## 2021-03-03 NOTE — ED Notes (Signed)
Pt tearful during conversation with MD. Pt reports feeling "this way" her whole life when asked about what precipitated this episode. Pt states she has no one. Superficial cut to inside of left wrist. Pt states this happened two days ago. Pt states "I fucked up bad. I didn't mean for it to be that bad". Bandaid removed to reveal wound. No bleeding noted. Pt states "just give me something for the anxiety and I'll leave if you want me to".

## 2021-03-03 NOTE — ED Notes (Signed)
1 unsuccessful attempt at collection of blood by this RN. Pt refused to allow to look for a second attempt. Lab called for blood collection.

## 2021-03-03 NOTE — ED Notes (Signed)
When asking pt about what brought her here today pt states 'I don't want to talk about it' and refuses to answer further questions.

## 2021-03-15 ENCOUNTER — Telehealth: Payer: Self-pay

## 2021-03-15 NOTE — Telephone Encounter (Signed)
Per Dian Queen, [place], call received from [facility] requesting a referral to [facility] as pregnant and high risk. (Seen at [facility] ED 03/11/2021). Call to client and counseled that does not need [facility] referral for prenatal care at [facility] [place] and can call them directly for appt. [facility] clinic number provided to client. Jossie Ng, RN

## 2021-09-15 ENCOUNTER — Other Ambulatory Visit: Payer: Self-pay

## 2021-09-15 ENCOUNTER — Encounter: Payer: Self-pay | Admitting: Family Medicine

## 2021-09-15 ENCOUNTER — Ambulatory Visit: Payer: Medicaid Other | Admitting: Family Medicine

## 2021-09-15 DIAGNOSIS — Z113 Encounter for screening for infections with a predominantly sexual mode of transmission: Secondary | ICD-10-CM

## 2021-09-15 DIAGNOSIS — B9689 Other specified bacterial agents as the cause of diseases classified elsewhere: Secondary | ICD-10-CM

## 2021-09-15 DIAGNOSIS — N760 Acute vaginitis: Secondary | ICD-10-CM

## 2021-09-15 LAB — WET PREP FOR TRICH, YEAST, CLUE
Trichomonas Exam: NEGATIVE
Yeast Exam: NEGATIVE

## 2021-09-15 MED ORDER — METRONIDAZOLE 500 MG PO TABS: 500.0000 mg | ORAL_TABLET | Freq: Two times a day (BID) | ORAL | 0 refills | Status: AC

## 2021-09-15 MED ORDER — METRONIDAZOLE 500 MG PO TABS: 500.0000 mg | ORAL_TABLET | Freq: Two times a day (BID) | ORAL | 0 refills | Status: DC

## 2021-09-15 NOTE — Progress Notes (Signed)
[facility] Department STI clinic/screening visit  Subjective:  [name] is a 35 y.o. female being seen today for an STI screening visit. The patient reports they do have symptoms.  Patient reports that they do not desire a pregnancy in the next year.   They reported they are not interested in discussing contraception today.  Patient's last menstrual period was 08/27/2021.   Patient has the following medical conditions:   Patient Active Problem List   Diagnosis Date Noted   Supervision of high risk pregnancy, antepartum 07/23/2020   Substance abuse complicating pregnancy, antepartum 07/23/2020   Bipolar disease in pregnancy, unspecified trimester (HCC) 07/23/2020   Genital herpes affecting pregnancy 07/23/2020   History of prior pregnancy with IUGR newborn 07/23/2020   Asthma affecting pregnancy, antepartum 07/23/2020   History of fetal anomaly in prior pregnancy, currently pregnant 07/23/2020   MDD (major depressive disorder), recurrent severe, without psychosis (HCC) 07/18/2020   Major depressive disorder, recurrent severe without psychotic features (HCC) 07/15/2020   Severe recurrent major depression without psychotic features (HCC) 06/03/2020   History of cesarean delivery 05/28/2020   Bipolar disorder, curr episode mixed, severe, with psychotic features (HCC)    MDD (major depressive disorder), recurrent episode, severe (HCC) 04/08/2020   Thoughts of self harm 02/01/2020   Cocaine use disorder, moderate, dependence (HCC) 01/01/2018   Cannabis use disorder, moderate, dependence (HCC) 01/01/2018   Pregnant 12/31/2017   Medically noncompliant 12/31/2017   Tobacco use disorder 01/12/2016   Stimulant use disorder (HCC) (cocaine) 01/12/2016   Alcohol use disorder, moderate, dependence (HCC) 01/12/2016   Self-inflicted laceration of wrist (HCC) 01/11/2016   HSIL on Pap smear of cervix 05/17/2015    Chief Complaint  Patient presents with   SEXUALLY TRANSMITTED  DISEASE    Screening    HPI  Patient reports here for screening, reports has s/sx x 3 days.    Last HIV test per patient/review of record was 11/25/2020 Patient reports last pap was 11/25/2020.   See flowsheet for further details and programmatic requirements.    The following portions of the patient's history were reviewed and updated as appropriate: allergies, current medications, past medical history, past social history, past surgical history and problem list.  Objective:  There were no vitals filed for this visit.  Physical Exam Vitals and nursing note reviewed.  Constitutional:      Appearance: Normal appearance.  HENT:     Head: Normocephalic and atraumatic.     Mouth/Throat:     Mouth: Mucous membranes are moist.     Pharynx: Oropharynx is clear. No oropharyngeal exudate or posterior oropharyngeal erythema.  Pulmonary:     Effort: Pulmonary effort is normal.  Abdominal:     General: Abdomen is flat.     Palpations: There is no mass.     Tenderness: There is no abdominal tenderness. There is no rebound.  Genitourinary:    General: Normal vulva.     Exam position: Lithotomy position.     Pubic Area: No rash or pubic lice.      Labia:        Right: No rash or lesion.        Left: No rash or lesion.      Vagina: Normal. No vaginal discharge, erythema, bleeding or lesions.     Cervix: No cervical motion tenderness, discharge, friability, lesion or erythema.     Uterus: Normal.      Adnexa: Right adnexa normal and left adnexa normal.  Rectum: Normal.     Comments: External genitalia without, lice, nits, erythema, edema , lesions or inguinal adenopathy. Vagina with normal mucosa and white discharge and pH < 4.  Red synthetic hair found in vaginal canal.  Odor present. Cervix without visual lesions, uterus firm, mobile, non-tender, no masses, CMT adnexal fullness or tenderness.   Musculoskeletal:     Cervical back: Normal range of motion and neck supple.   Lymphadenopathy:     Head:     Right side of head: No preauricular or posterior auricular adenopathy.     Left side of head: No preauricular or posterior auricular adenopathy.     Cervical: No cervical adenopathy.     Upper Body:     Right upper body: No supraclavicular or axillary adenopathy.     Left upper body: No supraclavicular or axillary adenopathy.     Lower Body: No right inguinal adenopathy. No left inguinal adenopathy.  Skin:    General: Skin is warm and dry.     Findings: No rash.  Neurological:     Mental Status: She is alert and oriented to person, place, and time.  Psychiatric:        Mood and Affect: Mood normal.        Behavior: Behavior normal.     Assessment and Plan:  [name] is a 35 y.o. female presenting to the [facility] Department for STI screening  1. Screening examination for venereal disease  Patient accepted all screenings including wet prep, oral, vaginal CT/GC and bloodwork for HIV/RPR.  Patient meets criteria for HepB screening? No. Ordered? No - declined  Patient meets criteria for HepC screening? No. Ordered? No - declined    - Chlamydia/Gonorrhea [place] - HIV [place] [facility] - Syphilis Serology, [place] [facility] - WET PREP FOR TRICH, YEAST, CLUE - Chlamydia/Gonorrhea [place] [facility]  2. Bacterial vaginosis  Wet prep results neg    Treatment needed for BV per provider assessment.   Discussed time line for [facility] results and that patient will be called with positive results and encouraged patient to call if she had not heard in 2 weeks.  Counseled to return or seek care for continued or worsening symptoms Recommended condom use with all sex  Patient is currently using no method  to prevent pregnancy.   - metroNIDAZOLE (FLAGYL) 500 MG tablet; Take 1 tablet (500 mg total) by mouth 2 (two) times daily for 7 days.  Dispense: 14 tablet; Refill: 0  Discussed with patient about having primary care provider.  Resource  list given.     Return for as needed.  No future appointments.  Wendi Snipes, FNP

## 2021-09-21 ENCOUNTER — Telehealth: Payer: Self-pay | Admitting: Family Medicine

## 2021-09-21 NOTE — Telephone Encounter (Signed)
The meds that was given to me is making me sick. I cannot take anymore. I'm having loose stools and very sick to my stomach.

## 2021-10-30 ENCOUNTER — Emergency Department: Payer: No Typology Code available for payment source

## 2021-10-30 ENCOUNTER — Emergency Department
Admission: EM | Admit: 2021-10-30 | Discharge: 2021-10-30 | Disposition: A | Discharge status: Home / Self Care | Payer: No Typology Code available for payment source | Attending: Emergency Medicine | Admitting: Emergency Medicine

## 2021-10-30 DIAGNOSIS — Z91010 Allergy to peanuts: Secondary | ICD-10-CM | POA: Insufficient documentation

## 2021-10-30 DIAGNOSIS — R45851 Suicidal ideations: Secondary | ICD-10-CM | POA: Diagnosis present

## 2021-10-30 DIAGNOSIS — F4325 Adjustment disorder with mixed disturbance of emotions and conduct: Secondary | ICD-10-CM

## 2021-10-30 DIAGNOSIS — I10 Essential (primary) hypertension: Secondary | ICD-10-CM | POA: Insufficient documentation

## 2021-10-30 DIAGNOSIS — R079 Chest pain, unspecified: Secondary | ICD-10-CM | POA: Diagnosis not present

## 2021-10-30 DIAGNOSIS — Z20822 Contact with and (suspected) exposure to covid-19: Secondary | ICD-10-CM | POA: Diagnosis not present

## 2021-10-30 DIAGNOSIS — R519 Headache, unspecified: Secondary | ICD-10-CM | POA: Diagnosis not present

## 2021-10-30 DIAGNOSIS — F17210 Nicotine dependence, cigarettes, uncomplicated: Secondary | ICD-10-CM | POA: Diagnosis not present

## 2021-10-30 DIAGNOSIS — E039 Hypothyroidism, unspecified: Secondary | ICD-10-CM | POA: Insufficient documentation

## 2021-10-30 DIAGNOSIS — F1020 Alcohol dependence, uncomplicated: Secondary | ICD-10-CM | POA: Diagnosis present

## 2021-10-30 LAB — COMPREHENSIVE METABOLIC PANEL
ALT: 13 U/L (ref 0–44)
AST: 15 U/L (ref 15–41)
Albumin: 4.1 g/dL (ref 3.5–5.0)
Alkaline Phosphatase: 58 U/L (ref 38–126)
Anion gap: 10 (ref 5–15)
BUN: 20 mg/dL (ref 6–20)
CO2: 22 mmol/L (ref 22–32)
Calcium: 8.6 mg/dL — ABNORMAL LOW (ref 8.9–10.3)
Chloride: 104 mmol/L (ref 98–111)
Creatinine, Ser: 0.58 mg/dL (ref 0.44–1.00)
GFR, Estimated: >60 mL/min (ref >60)
Glucose, Bld: 84 mg/dL (ref 70–99)
Potassium: 3.7 mmol/L (ref 3.5–5.1)
Sodium: 136 mmol/L (ref 135–145)
Total Bilirubin: 1 mg/dL (ref 0.3–1.2)
Total Protein: 7.2 g/dL (ref 6.5–8.1)

## 2021-10-30 LAB — RESP PANEL BY RT-PCR (FLU A&B, COVID) ARPGX2
Influenza A by PCR: NEGATIVE
Influenza B by PCR: NEGATIVE
SARS Coronavirus 2 by RT PCR: NEGATIVE

## 2021-10-30 LAB — CBC WITH DIFFERENTIAL/PLATELET
Abs Immature Granulocytes: 0.01 10*3/uL (ref 0.00–0.07)
Basophils Absolute: 0.1 10*3/uL (ref 0.0–0.1)
Basophils Relative: 1 %
Eosinophils Absolute: 0.9 10*3/uL — ABNORMAL HIGH (ref 0.0–0.5)
Eosinophils Relative: 11 %
HCT: 39.8 % (ref 36.0–46.0)
Hemoglobin: 13.1 g/dL (ref 12.0–15.0)
Immature Granulocytes: 0 %
Lymphocytes Relative: 42 %
Lymphs Abs: 3.6 10*3/uL (ref 0.7–4.0)
MCH: 29.6 pg (ref 26.0–34.0)
MCHC: 32.9 g/dL (ref 30.0–36.0)
MCV: 90 fL (ref 80.0–100.0)
Monocytes Absolute: 0.4 10*3/uL (ref 0.1–1.0)
Monocytes Relative: 4 %
Neutro Abs: 3.6 10*3/uL (ref 1.7–7.7)
Neutrophils Relative %: 42 %
Platelets: 229 10*3/uL (ref 150–400)
RBC: 4.42 MIL/uL (ref 3.87–5.11)
RDW: 13.3 % (ref 11.5–15.5)
WBC: 8.6 10*3/uL (ref 4.0–10.5)
nRBC: 0 % (ref 0.0–0.2)

## 2021-10-30 LAB — MAGNESIUM: Magnesium: 2 mg/dL (ref 1.7–2.4)

## 2021-10-30 LAB — ETHANOL: Alcohol, Ethyl (B): 31 mg/dL — ABNORMAL HIGH (ref <10)

## 2021-10-30 LAB — TROPONIN I (HIGH SENSITIVITY): Troponin I (High Sensitivity): 8 ng/L (ref <18)

## 2021-10-30 LAB — ACETAMINOPHEN LEVEL: Acetaminophen (Tylenol), Serum: <10 ug/mL — ABNORMAL LOW (ref 10–30)

## 2021-10-30 LAB — SALICYLATE LEVEL: Salicylate Lvl: <7 mg/dL — ABNORMAL LOW (ref 7.0–30.0)

## 2021-10-30 MED ORDER — ACETAMINOPHEN 500 MG PO TABS: 1000.0000 mg | ORAL_TABLET | Freq: Once | ORAL | Status: AC

## 2021-10-30 MED ORDER — ACETAMINOPHEN 500 MG PO TABS
1000.0000 mg | ORAL_TABLET | Freq: Once | ORAL | Status: AC
  Administered 2021-10-30: 1000 mg via ORAL
  Filled 2021-10-30: qty 2

## 2021-10-30 MED ORDER — ACETAMINOPHEN 500 MG PO TABS
ORAL_TABLET | ORAL | Status: AC
  Administered 2021-10-30: 1000 mg via ORAL
  Filled 2021-10-30: qty 2

## 2021-10-30 NOTE — Discharge Instructions (Signed)
Your blood pressure was elevated during your ED visit today.  Please have this rechecked by your primary care physician next 3 to 7 days.

## 2021-10-30 NOTE — ED Provider Notes (Signed)
Patient seen by psychiatry who cleared patient for discharge with plan for outpatient follow-up.  Discharged in stable condition.   Gilles Chiquito, MD 10/30/21 [phone]
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
## 2021-10-30 NOTE — ED Notes (Signed)
This RN attempted to contact both contacts listed in patient chart with patient consent for ride home with no answer.

## 2021-10-30 NOTE — ED Notes (Signed)
Patient alert and oriented, warm and dry, in no acute distress. Patient report SI due to family stressors.Denied HI, AVH and pain. Patient made aware of Q15 minute rounds and Psychologist, counselling presence for their safety. Patient instructed to come to me with needs or concerns.

## 2021-10-30 NOTE — ED Notes (Signed)
In patient belongings Chilton Si jumpsuit Black hood jacket Flip flop Key Two black cell phones
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
## 2021-10-30 NOTE — ED Provider Notes (Signed)
[facility] Emergency Department Provider Note  ____________________________________________   Event Date/Time   First MD Initiated Contact with Patient 10/30/21 [phone]     (approximate)  I have reviewed the triage vital signs and the nursing notes.   HISTORY  Chief Complaint No chief complaint on file.    HPI [name] is a 36 y.o. female with history of hypertension, depression and previous suicide attempt, substance abuse who presents to the emergency department with complaints of suicidal thoughts without plan.  No HI.  No hallucinations.  Denies drug or alcohol use.  States she is having a headache and achy chest pain that she has had for "a while".  No shortness of breath, fevers, cough, lower extremity swelling or pain.  No aggravating or relieving factors.  She states that she only takes medications for blood pressure but she cannot recall the name of this medication.        Past Medical History:  Diagnosis Date   Anxiety    Depression    Gestational diabetes    History of substance abuse ([place])    History of suicide attempt    History of thyroid disease    Hypertension    Thyroid disease     Patient Active Problem List   Diagnosis Date Noted   Supervision of high risk pregnancy, antepartum 07/23/2020   Substance abuse complicating pregnancy, antepartum 07/23/2020   Bipolar disease in pregnancy, unspecified trimester ([place]) 07/23/2020   Genital herpes affecting pregnancy 07/23/2020   History of prior pregnancy with IUGR newborn 07/23/2020   Asthma affecting pregnancy, antepartum 07/23/2020   History of fetal anomaly in prior pregnancy, currently pregnant 07/23/2020   MDD (major depressive disorder), recurrent severe, without psychosis ([place]) 07/18/2020   Major depressive disorder, recurrent severe without psychotic features ([place]) 07/15/2020   Severe recurrent major depression without psychotic features ([place]) 06/03/2020   History of  cesarean delivery 05/28/2020   Bipolar disorder, curr episode mixed, severe, with psychotic features ([place])    MDD (major depressive disorder), recurrent episode, severe ([place]) 04/08/2020   Thoughts of self harm 02/01/2020   Cocaine use disorder, moderate, dependence ([place]) 01/01/2018   Cannabis use disorder, moderate, dependence ([place]) 01/01/2018   Pregnant 12/31/2017   Medically noncompliant 12/31/2017   Tobacco use disorder 01/12/2016   Stimulant use disorder ([place]) (cocaine) 01/12/2016   Alcohol use disorder, moderate, dependence ([place]) [id number]   Self-inflicted laceration of wrist ([place]) 01/11/2016   HSIL on Pap smear of cervix 05/17/2015    Past Surgical History:  Procedure Laterality Date   CESAREAN SECTION  2000, 2002, 2013   WISDOM TOOTH EXTRACTION      Prior to Admission medications   Medication Sig Start Date End Date Taking? Authorizing Provider  acetaminophen (TYLENOL) 325 MG tablet Take 650 mg by mouth every 6 (six) hours as needed for pain. 08/24/20   [provider]  ibuprofen (ADVIL) 800 MG tablet Take 1 tablet (800 mg total) by mouth every 8 (eight) hours as needed. 12/14/20   Menshew, Dannielle Karvonen, PA-C  QUEtiapine (SEROQUEL) 100 MG tablet Take 100 mg by mouth at bedtime. Patient not taking: No sig reported    [provider]  QUEtiapine (SEROQUEL) 25 MG tablet Take 25 mg by mouth every 6 (six) hours as needed. Patient not taking: No sig reported    [provider]  sertraline (ZOLOFT) 50 MG tablet Take 50 mg by mouth at bedtime.  Patient not taking: No sig reported  [provider]  traMADol (ULTRAM) 50 MG tablet Take 1 tablet (50 mg total) by mouth every 6 (six) hours as needed. Patient not taking: No sig reported 08/26/20   Sherrie George B, FNP    Allergies Aspirin, Banana, Other, Peanut oil, Peanut-containing drug products, and Pecan nut (diagnostic)  Family History  Problem Relation Age of Onset   Diabetes Mother      Social History Social History   Tobacco Use   Smoking status: Former    Packs/day: 0.25    Types: Cigarettes   Smokeless tobacco: Never  Vaping Use   Vaping Use: Never used  Substance Use Topics   Alcohol use: Not Currently    Comment: social drinker   Drug use: Not Currently    Types: Cocaine, Marijuana    Comment: Positive UDS 12/2017, last used mj 3 days ago , last used cocaine  a couple of months ago     Review of Systems Constitutional: No fever. Eyes: No visual changes. ENT: No sore throat. Cardiovascular: + chest pain. Respiratory: Denies shortness of breath. Gastrointestinal: No nausea, vomiting, diarrhea. Genitourinary: Negative for dysuria. Musculoskeletal: Negative for back pain. Skin: Negative for rash. Neurological: Negative for focal weakness or numbness.  ____________________________________________   PHYSICAL EXAM:  VITAL SIGNS: Today's Vitals   10/30/21 0605 10/30/21 0607  BP: (!) 151/82   Pulse: 69   Resp: 20   Temp: 98.7 F (37.1 C)   TempSrc: Oral   SpO2: 98%   PainSc:  6    There is no height or weight on file to calculate BMI. CONSTITUTIONAL: Alert and oriented and responds appropriately to questions. Well-appearing; well-nourished HEAD: Normocephalic EYES: Conjunctivae clear, pupils appear equal, EOM appear intact ENT: normal nose; moist mucous membranes NECK: Supple, normal ROM CARD: RRR; S1 and S2 appreciated; no murmurs, no clicks, no rubs, no gallops CHEST:  Chest wall is nontender to palpation.  No crepitus, ecchymosis, erythema, warmth, rash or other lesions present.   RESP: Normal chest excursion without splinting or tachypnea; breath sounds clear and equal bilaterally; no wheezes, no rhonchi, no rales, no hypoxia or respiratory distress, speaking full sentences ABD/GI: Normal bowel sounds; non-distended; soft, non-tender, no rebound, no guarding, no peritoneal signs, no hepatosplenomegaly BACK: The back appears normal EXT:  Normal ROM in all joints; no deformity noted, no edema; no cyanosis, no calf tenderness or calf swelling SKIN: Normal color for age and race; warm; no rash on exposed skin NEURO: Moves all extremities equally PSYCH: Tearful.  Endorses SI.  No HI or hallucinations.  ____________________________________________   LABS (all labs ordered are listed, but only abnormal results are displayed)  Labs Reviewed  COMPREHENSIVE METABOLIC PANEL - Abnormal; Notable for the following components:      Result Value   Calcium 8.6 (*)    All other components within normal limits  ETHANOL - Abnormal; Notable for the following components:   Alcohol, Ethyl (B) 31 (*)    All other components within normal limits  CBC WITH DIFFERENTIAL/PLATELET - Abnormal; Notable for the following components:   Eosinophils Absolute 0.9 (*)    All other components within normal limits  ACETAMINOPHEN LEVEL - Abnormal; Notable for the following components:   Acetaminophen (Tylenol), Serum <10 (*)    All other components within normal limits  SALICYLATE LEVEL - Abnormal; Notable for the following components:   Salicylate Lvl [id number] (*)    All other components within normal limits  RESP PANEL BY RT-PCR (FLU A&B, COVID) ARPGX2  URINE DRUG SCREEN, QUALITATIVE ([facility] ONLY)  MAGNESIUM  POC URINE PREG, ED  TROPONIN I (HIGH SENSITIVITY)   ____________________________________________  EKG   EKG Interpretation  Date/Time:  Sunday October 30 2021 06:19:32 EST Ventricular Rate:  72 PR Interval:  148 QRS Duration: 104 QT Interval:  460 QTC Calculation: 503 R Axis:   89 Text Interpretation: Normal sinus rhythm Minimal voltage criteria for LVH, may be normal variant ( Sokolow-Lyon ) Prolonged QT Abnormal ECG No significant change since last tracing in 2019 Confirmed by Pryor Curia [phone]) on 10/30/2021 6:25:05 AM        ____________________________________________  RADIOLOGY Jessie Foot Shahab Polhamus, personally viewed and  evaluated these images (plain radiographs) as part of my medical decision making, as well as reviewing the written report by the radiologist.  ED MD interpretation: Chest x-ray shows no acute abnormality.  Official radiology report(s): DG Chest Port 1 View  Result Date: 10/30/2021 CLINICAL DATA:  Chest pain EXAM: PORTABLE CHEST 1 VIEW COMPARISON:  10/21/2019 chest radiograph. FINDINGS: Stable cardiomediastinal silhouette with borderline mild cardiomegaly. No pneumothorax. No pleural effusion. Lungs appear clear, with no acute consolidative airspace disease and no pulmonary edema. IMPRESSION: Borderline mild cardiac silhouette enlargement. No pulmonary edema. No active pulmonary disease. Electronically Signed   By: Ilona Sorrel M.D.   On: 10/30/2021 06:25    ____________________________________________   PROCEDURES  Procedure(s) performed (including Critical Care):  Procedures    ____________________________________________   INITIAL IMPRESSION / ASSESSMENT AND PLAN / ED COURSE  As part of my medical decision making, I reviewed the following data within the [place] notes reviewed and incorporated, Labs reviewed , EKG interpreted , Old EKG reviewed, Old chart reviewed, Radiograph reviewed , A consult was requested and obtained from this/these consultant(s) Psychiatry, and Notes from prior ED visits         Patient here with complaints of suicidal thoughts.  Also complaining of atypical chest pain.  Doubt ACS, PE, dissection.  EKG shows no new ischemic change compared to previous.  She does have a slightly prolonged QT interval but she has had this previously.  We will check electrolytes.  Will obtain 1 troponin and a chest x-ray.  Doubt pneumothorax, CHF, pneumonia.  Will give Tylenol for discomfort.  Other screening labs, urine pending.  Will consult psychiatry.  Patient here voluntarily.  ED PROGRESS  Patient screening labs are unremarkable.  Troponin  negative.  Chest x-ray reviewed by myself and radiology shows stable cardiomegaly without other abnormality.  Alcohol level slightly elevated at 31.  Patient currently medically cleared and awaiting psychiatric evaluation for further disposition.  I reviewed all nursing notes and pertinent previous records as available.  I have reviewed and interpreted any EKGs, lab and urine results, imaging (as available).  ____________________________________________   FINAL CLINICAL IMPRESSION(S) / ED DIAGNOSES  Final diagnoses:  Chest pain  Suicidal ideation     ED Discharge Orders     None       *Please note:  [name] was evaluated in Emergency Department on 10/30/2021 for the symptoms described in the history of present illness. She was evaluated in the context of the global COVID-19 pandemic, which necessitated consideration that the patient might be at risk for infection with the SARS-CoV-2 virus that causes COVID-19. Institutional protocols and algorithms that pertain to the evaluation of patients at risk for COVID-19 are in a state of rapid change based on information released by regulatory bodies including the CDC and federal  and state organizations. These policies and algorithms were followed during the patient's care in the ED.  Some ED evaluations and interventions may be delayed as a result of limited staffing during and the pandemic.*   Note:  This document was prepared using Dragon voice recognition software and may include unintentional dictation errors.    Jhon Mallozzi, Layla Maw, DO 10/30/21 [phone]

## 2021-10-30 NOTE — ED Notes (Signed)
E-signature not working at this time. Pt verbalized understanding of D/C instructions, prescriptions and follow up care with no further questions at this time. Pt in NAD and ambulatory at time of D/C. Pt father in lobby and taking patient home. Patient belongings bag 1/1 returned to patient at time of departure.

## 2021-11-15 ENCOUNTER — Telehealth: Payer: Self-pay | Admitting: Licensed Clinical Social Worker

## 2021-11-15 NOTE — Telephone Encounter (Signed)
Patient called to [facility] requesting services.

## 2022-02-05 ENCOUNTER — Other Ambulatory Visit: Payer: Self-pay

## 2022-02-05 ENCOUNTER — Emergency Department: Payer: Medicaid Other

## 2022-02-05 ENCOUNTER — Emergency Department
Admission: EM | Admit: 2022-02-05 | Discharge: 2022-02-05 | Disposition: A | Discharge status: Home / Self Care | Payer: Medicaid Other | Attending: Emergency Medicine | Admitting: Emergency Medicine

## 2022-02-05 ENCOUNTER — Telehealth: Payer: Self-pay | Admitting: Emergency Medicine

## 2022-02-05 DIAGNOSIS — F17210 Nicotine dependence, cigarettes, uncomplicated: Secondary | ICD-10-CM | POA: Insufficient documentation

## 2022-02-05 DIAGNOSIS — Z20822 Contact with and (suspected) exposure to covid-19: Secondary | ICD-10-CM | POA: Insufficient documentation

## 2022-02-05 DIAGNOSIS — J9801 Acute bronchospasm: Secondary | ICD-10-CM | POA: Diagnosis not present

## 2022-02-05 DIAGNOSIS — R059 Cough, unspecified: Secondary | ICD-10-CM | POA: Diagnosis present

## 2022-02-05 DIAGNOSIS — J069 Acute upper respiratory infection, unspecified: Secondary | ICD-10-CM | POA: Diagnosis not present

## 2022-02-05 LAB — RESP PANEL BY RT-PCR (FLU A&B, COVID) ARPGX2
Influenza A by PCR: NEGATIVE
Influenza B by PCR: NEGATIVE
SARS Coronavirus 2 by RT PCR: NEGATIVE

## 2022-02-05 MED ORDER — PREDNISONE 20 MG PO TABS
60.0000 mg | ORAL_TABLET | Freq: Once | ORAL | Status: AC
  Administered 2022-02-05: 60 mg via ORAL
  Filled 2022-02-05: qty 3

## 2022-02-05 MED ORDER — IPRATROPIUM-ALBUTEROL 0.5-2.5 (3) MG/3ML IN SOLN
3.0000 mL | Freq: Once | RESPIRATORY_TRACT | Status: AC
  Administered 2022-02-05: 3 mL via RESPIRATORY_TRACT
  Filled 2022-02-05: qty 3

## 2022-02-05 MED ORDER — ALBUTEROL SULFATE HFA 108 (90 BASE) MCG/ACT IN AERS
2.0000 | INHALATION_SPRAY | Freq: Once | RESPIRATORY_TRACT | Status: AC
Start: 2022-02-05 — End: 2022-02-05
  Administered 2022-02-05: 2 via RESPIRATORY_TRACT
  Filled 2022-02-05: qty 6.7

## 2022-02-05 MED ORDER — PREDNISONE 50 MG PO TABS: 50.0000 mg | ORAL_TABLET | Freq: Every day | ORAL | 0 refills | Status: DC

## 2022-02-05 MED ORDER — KETOROLAC TROMETHAMINE 30 MG/ML IJ SOLN
30.0000 mg | Freq: Once | INTRAMUSCULAR | Status: AC
  Administered 2022-02-05: 30 mg via INTRAMUSCULAR
  Filled 2022-02-05: qty 1

## 2022-02-05 MED ORDER — HYDROCHLOROTHIAZIDE 25 MG PO TABS: 25.0000 mg | ORAL_TABLET | Freq: Every day | ORAL | 1 refills | Status: DC

## 2022-02-05 NOTE — Telephone Encounter (Signed)
Refill bp meds  

## 2022-02-05 NOTE — ED Triage Notes (Addendum)
First nurse note: pt comes from home ems for cough, congestion 4-5 days ago. HTN with EMS, has been out of HTN meds.

## 2022-02-05 NOTE — ED Provider Notes (Signed)
[facility] Provider Note    Event Date/Time   First MD Initiated Contact with Patient 02/05/22 1433     (approximate)   History   Cough   HPI  [name] is a 36 y.o. female who presents with complaints of cough, chills, body aches, wheezing.  Symptoms been ongoing for approximately 2 days.  She also reports headaches.  Has not take anything for this.  Does smoke cigarettes.  No sick contacts reported.     Physical Exam   Triage Vital Signs: ED Triage Vitals  Enc Vitals Group     BP 02/05/22 1409 (!) 156/75     Pulse Rate 02/05/22 1409 81     Resp 02/05/22 1409 (!) 22     Temp 02/05/22 1409 98 F (36.7 C)     Temp Source 02/05/22 1409 Oral     SpO2 02/05/22 1409 97 %     Weight 02/05/22 1354 72 kg (158 lb 11.7 oz)     Height 02/05/22 1354 1.499 m (4\' 11" )     Head Circumference --      Peak Flow --      Pain Score 02/05/22 1354 7     Pain Loc --      Pain Edu? --      Excl. in GC? --     Most recent vital signs: Vitals:   02/05/22 1409  BP: (!) 156/75  Pulse: 81  Resp: (!) 22  Temp: 98 F (36.7 C)  SpO2: 97%     General: Awake, no distress.  CV:  Good peripheral perfusion.  Regular rate and rhythm Resp:  Normal effort.  Scattered wheezing on exam, mild tachypnea Abd:  No distention.  No tenderness palpation Other:     ED Results / Procedures / Treatments   Labs (all labs ordered are listed, but only abnormal results are displayed) Labs Reviewed  RESP PANEL BY RT-PCR (FLU A&B, COVID) ARPGX2     EKG     RADIOLOGY Chest x-ray reviewed by me, no evidence of pneumonia    PROCEDURES:  Critical Care performed:   Procedures   MEDICATIONS ORDERED IN ED: Medications  ketorolac (TORADOL) 30 MG/ML injection 30 mg (30 mg Intramuscular Given 02/05/22 1503)  predniSONE (DELTASONE) tablet 60 mg (60 mg Oral Given 02/05/22 1502)  albuterol (VENTOLIN HFA) 108 (90 Base) MCG/ACT inhaler 2 puff (2 puffs Inhalation  Given 02/05/22 1506)  ipratropium-albuterol (DUONEB) 0.5-2.5 (3) MG/3ML nebulizer solution 3 mL (3 mLs Nebulization Given 02/05/22 1601)  ipratropium-albuterol (DUONEB) 0.5-2.5 (3) MG/3ML nebulizer solution 3 mL (3 mLs Nebulization Given 02/05/22 1601)     IMPRESSION / MDM / ASSESSMENT AND PLAN / ED COURSE  I reviewed the triage vital signs and the nursing notes.  Patient presents with symptoms consistent with upper respiratory infection with some bronchospasm.  Question COVID/flu  Send COVID PCR, obtain chest x-ray, will give IM Toradol, albuterol inhaler, p.o. prednisone and reevaluate.  COVID and flu tests negative  We will treat with DuoNebs for some mild continued wheezing  ----------------------------------------- 4:26 PM on 02/05/2022 ----------------------------------------- After nebulizers, patient is much improved, appropriate for discharge with p.o. prednisone, will give albuterol inhaler to her          FINAL CLINICAL IMPRESSION(S) / ED DIAGNOSES   Final diagnoses:  Viral URI with cough  Bronchospasm     Rx / DC Orders   ED Discharge Orders          Ordered  predniSONE (DELTASONE) 50 MG tablet  Daily with breakfast        02/05/22 1625             Note:  This document was prepared using Dragon voice recognition software and may include unintentional dictation errors.   Jene Every, MD 02/05/22 1626

## 2022-02-05 NOTE — ED Notes (Signed)
Pt provided with sandwich box.

## 2022-03-23 IMAGING — US US OB COMP +14 WK
1 series · 13 of 28 positions shown · non-contrast
Comparison: none

CLINICAL DATA: Gestational diabetes.  Evaluate growth

EXAM:
OBSTETRICAL ULTRASOUND >14 WKS

[Series 1: us ob comp + 14 wk · 13 of 63 slices shown]
[im 3/63]
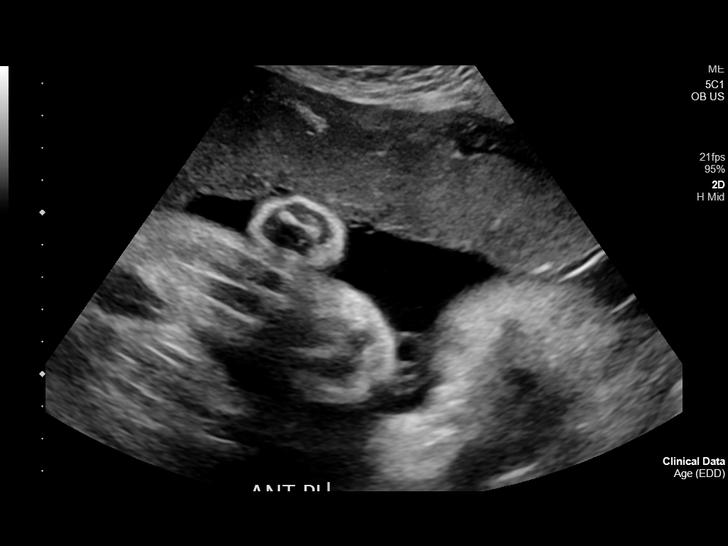
[im 7/63]
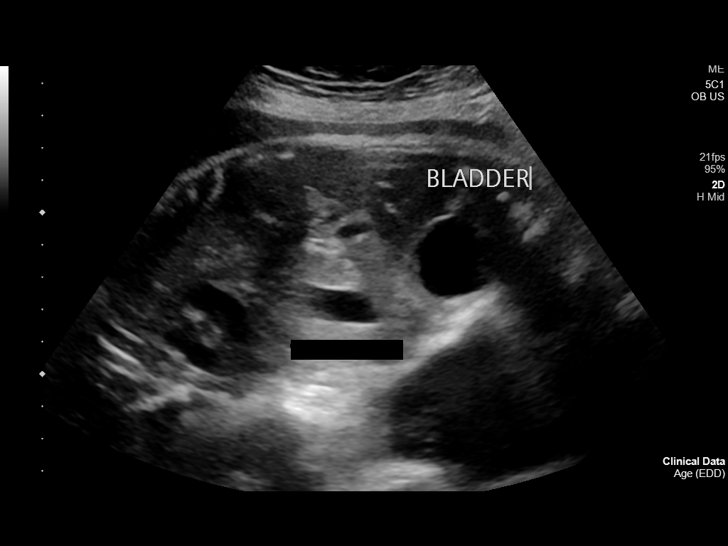
[im 12/63]
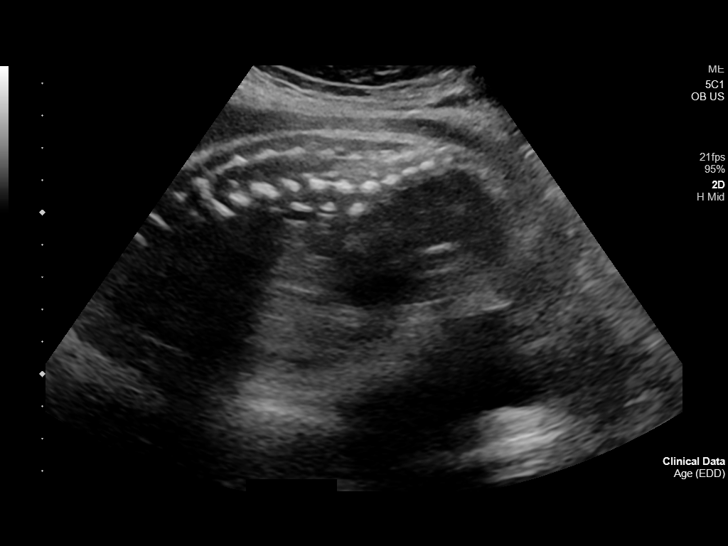
[im 17/63]
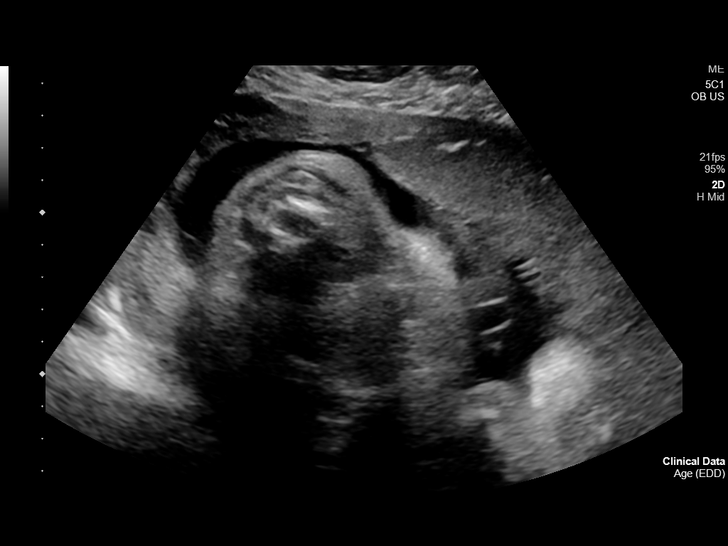
[im 21/63]
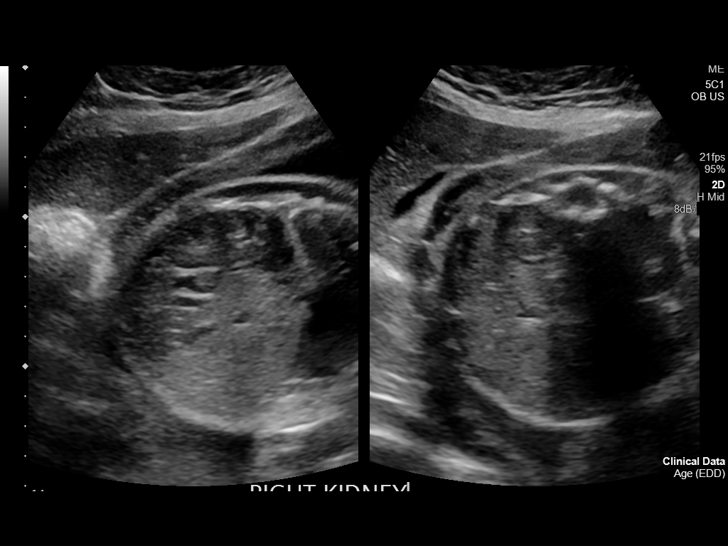
[im 26/63]
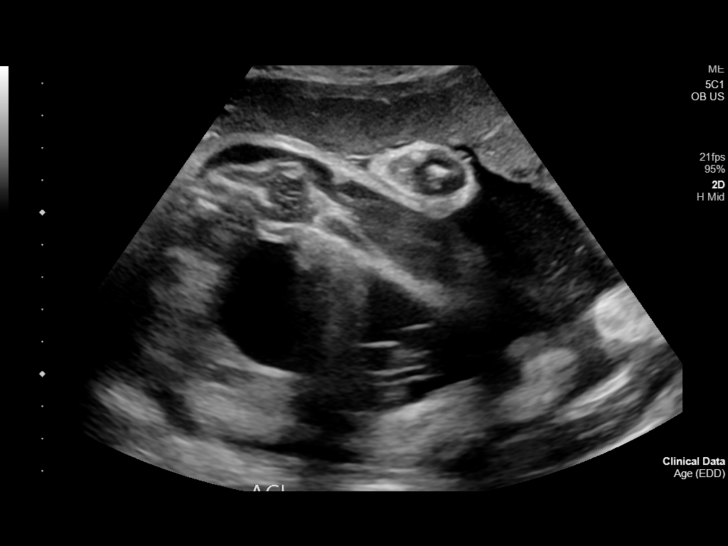
[im 33/63]
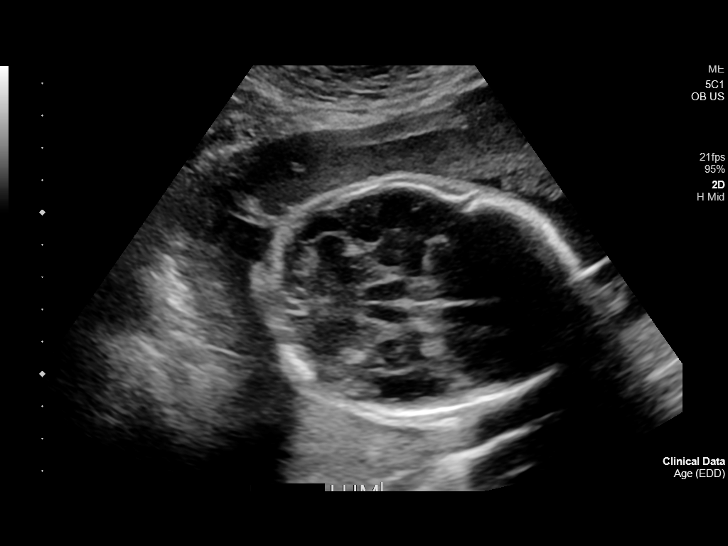
[im 37/63]
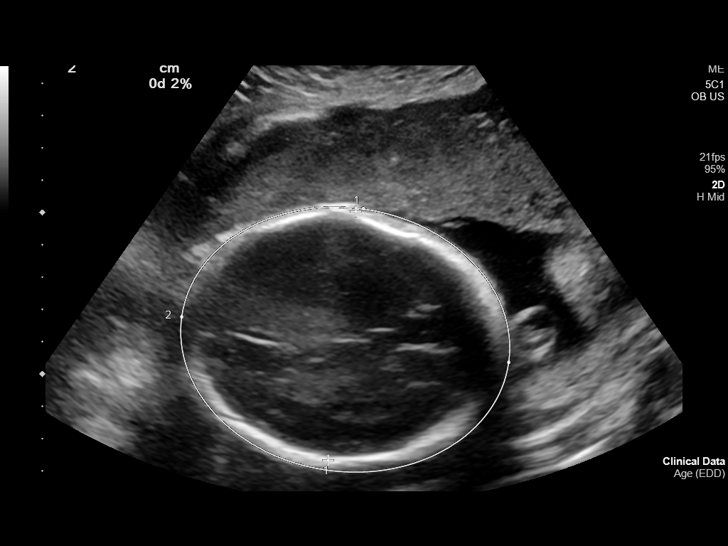
[im 42/63]
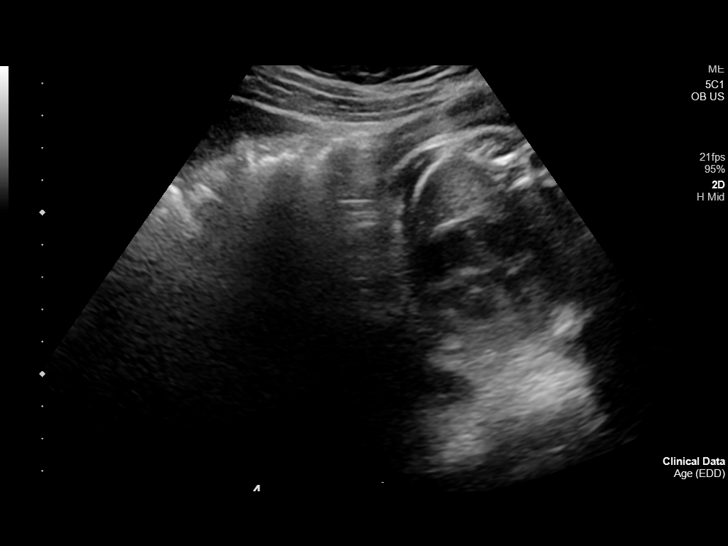
[im 46/63]
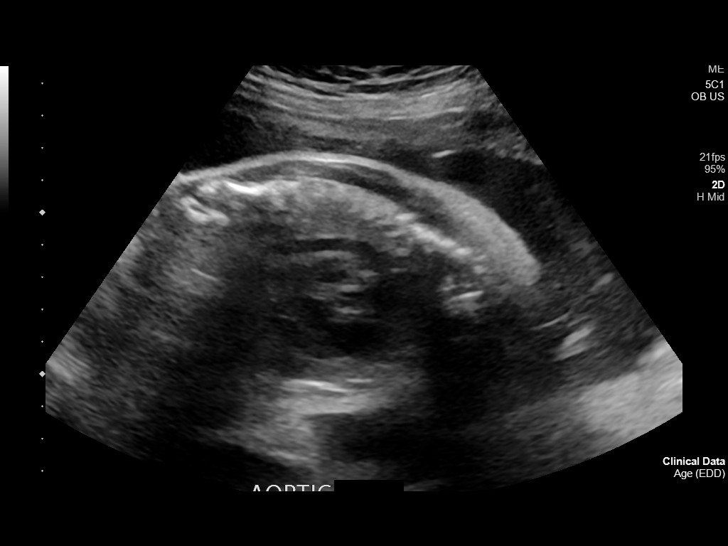
[im 51/63]
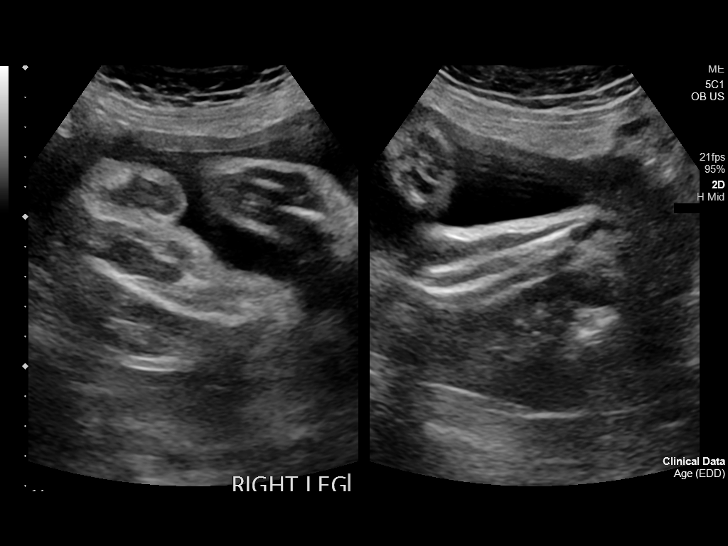
[im 56/63]
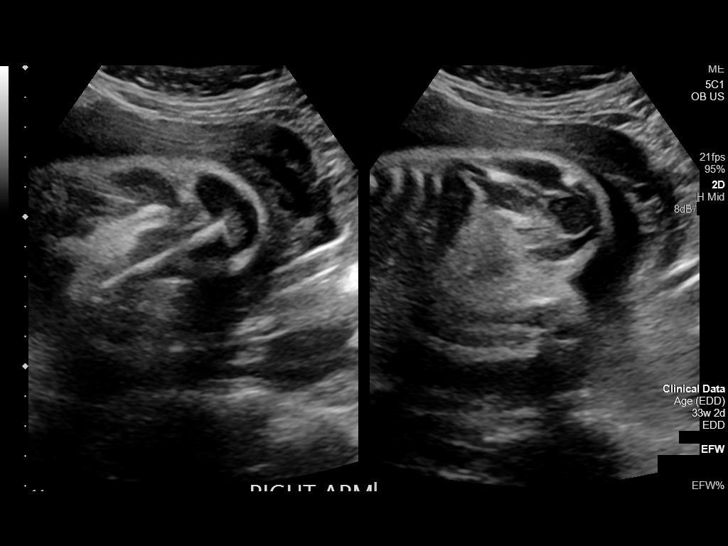
[im 60/63]
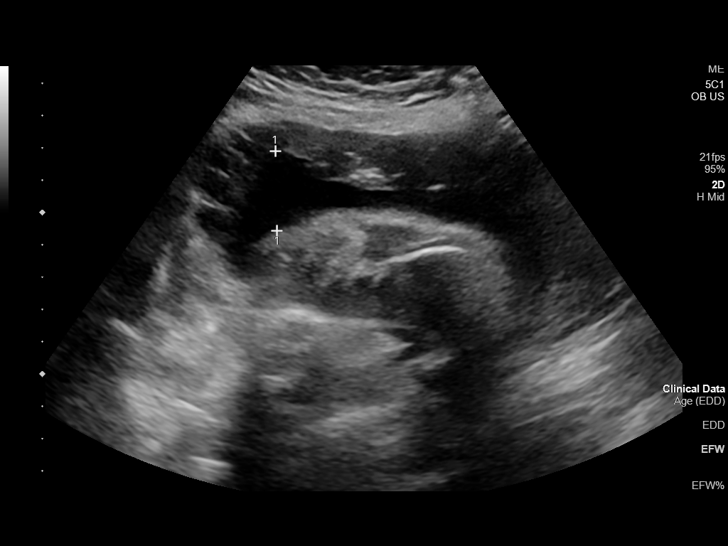

[13 of 28 positions shown; findings below may reference images not displayed]

FINDINGS: Number of Fetuses: 1

Heart Rate:  141 bpm

Movement: Yes

Presentation: Cephalic

Previa: No

Placental Location: Anterior

Amniotic Fluid (Subjective): Normal

Amniotic Fluid (Objective):

AFI = 15.2 cm (5%ile= 8.3 cm, 95%= 24.5 cm for 33 wks)

FETAL BIOMETRY

BPD: 7.8cm 31w 1d

HC:   29.1cm 32w 0d

AC:   28.0cm 32w 0d

FL:   5.9cm 30w 5d

Current Mean GA: 31w 3d US EDC: 09/22/2020

Assigned GA:  33w 2d Assigned EDC: 09/09/2020

Estimated Fetal Weight:  1,783g 6%ile

FETAL ANATOMY

Lateral Ventricles: Appears normal

Thalami/CSP: Appears normal

Posterior Fossa:  Appears normal

Nuchal Region: Not visualized

Upper Lip: Appears normal

Spine: Appears normal

4 Chamber Heart on Left: Appears normal

LVOT: Not visualized

RVOT: Not visualized

Stomach on Left: Appears normal

3 Vessel Cord: Appears normal

Cord Insertion site: Appears normal

Kidneys: Appears normal

Bladder: Appears normal

Extremities: Appears normal

Technically difficult due to: Advanced gestational age and fetal
position.

Maternal Findings:

Cervix:  Not well seen, obscured by fetal calvarium.
IMPRESSION: 1. Single live intrauterine gestation in cephalic presentation with
assigned gestational age of 33 weeks 2 days.
2. Findings of intrauterine growth restriction with estimated fetal
weight within the 6th percentile. Biparietal diameter, head
circumference, and femur length are all below the 3rd percentile.
3. Amniotic fluid index of 15.2 cm, within normal limits.
4. Fetal anatomic survey, as above, is limited secondary to
gestational age. No fetal anomaly identified.

## 2022-07-09 ENCOUNTER — Other Ambulatory Visit: Payer: Self-pay

## 2022-07-09 ENCOUNTER — Emergency Department (EMERGENCY_DEPARTMENT_HOSPITAL)
Admission: EM | Admit: 2022-07-09 | Discharge: 2022-07-11 | Disposition: A | Discharge status: Home / Self Care | Payer: No Typology Code available for payment source | Source: Home / Self Care | Attending: Emergency Medicine | Admitting: Emergency Medicine

## 2022-07-09 DIAGNOSIS — F1420 Cocaine dependence, uncomplicated: Secondary | ICD-10-CM | POA: Insufficient documentation

## 2022-07-09 DIAGNOSIS — R7309 Other abnormal glucose: Secondary | ICD-10-CM | POA: Insufficient documentation

## 2022-07-09 DIAGNOSIS — Z20822 Contact with and (suspected) exposure to covid-19: Secondary | ICD-10-CM | POA: Insufficient documentation

## 2022-07-09 DIAGNOSIS — Z79899 Other long term (current) drug therapy: Secondary | ICD-10-CM | POA: Insufficient documentation

## 2022-07-09 DIAGNOSIS — F1220 Cannabis dependence, uncomplicated: Secondary | ICD-10-CM | POA: Insufficient documentation

## 2022-07-09 DIAGNOSIS — F3164 Bipolar disorder, current episode mixed, severe, with psychotic features: Secondary | ICD-10-CM | POA: Diagnosis present

## 2022-07-09 DIAGNOSIS — F332 Major depressive disorder, recurrent severe without psychotic features: Secondary | ICD-10-CM

## 2022-07-09 LAB — CBC
HCT: 42.8 % (ref 36.0–46.0)
Hemoglobin: 13.5 g/dL (ref 12.0–15.0)
MCH: 28.8 pg (ref 26.0–34.0)
MCHC: 31.5 g/dL (ref 30.0–36.0)
MCV: 91.3 fL (ref 80.0–100.0)
Platelets: 232 10*3/uL (ref 150–400)
RBC: 4.69 MIL/uL (ref 3.87–5.11)
RDW: 13.7 % (ref 11.5–15.5)
WBC: 6.8 10*3/uL (ref 4.0–10.5)
nRBC: 0 % (ref 0.0–0.2)

## 2022-07-09 LAB — COMPREHENSIVE METABOLIC PANEL
ALT: 11 U/L (ref 0–44)
AST: 14 U/L — ABNORMAL LOW (ref 15–41)
Albumin: 4 g/dL (ref 3.5–5.0)
Alkaline Phosphatase: 55 U/L (ref 38–126)
Anion gap: 9 (ref 5–15)
BUN: 16 mg/dL (ref 6–20)
CO2: 21 mmol/L — ABNORMAL LOW (ref 22–32)
Calcium: 8.7 mg/dL — ABNORMAL LOW (ref 8.9–10.3)
Chloride: 107 mmol/L (ref 98–111)
Creatinine, Ser: 0.71 mg/dL (ref 0.44–1.00)
GFR, Estimated: >60 mL/min (ref >60)
Glucose, Bld: 103 mg/dL — ABNORMAL HIGH (ref 70–99)
Potassium: 3.9 mmol/L (ref 3.5–5.1)
Sodium: 137 mmol/L (ref 135–145)
Total Bilirubin: 0.7 mg/dL (ref 0.3–1.2)
Total Protein: 7.7 g/dL (ref 6.5–8.1)

## 2022-07-09 LAB — RESP PANEL BY RT-PCR (FLU A&B, COVID) ARPGX2
Influenza A by PCR: NEGATIVE
Influenza B by PCR: NEGATIVE
SARS Coronavirus 2 by RT PCR: NEGATIVE

## 2022-07-09 LAB — URINE DRUG SCREEN, QUALITATIVE (ARMC ONLY)
Amphetamines, Ur Screen: NOT DETECTED
Barbiturates, Ur Screen: NOT DETECTED
Benzodiazepine, Ur Scrn: NOT DETECTED
Cannabinoid 50 Ng, Ur ~~LOC~~: POSITIVE — AB
Cocaine Metabolite,Ur ~~LOC~~: POSITIVE — AB
MDMA (Ecstasy)Ur Screen: NOT DETECTED
Methadone Scn, Ur: NOT DETECTED
Opiate, Ur Screen: NOT DETECTED
Phencyclidine (PCP) Ur S: NOT DETECTED
Tricyclic, Ur Screen: NOT DETECTED

## 2022-07-09 LAB — ACETAMINOPHEN LEVEL: Acetaminophen (Tylenol), Serum: <10 ug/mL — ABNORMAL LOW (ref 10–30)

## 2022-07-09 LAB — ETHANOL: Alcohol, Ethyl (B): <10 mg/dL (ref <10)

## 2022-07-09 LAB — POC URINE PREG, ED: Preg Test, Ur: NEGATIVE

## 2022-07-09 LAB — SALICYLATE LEVEL: Salicylate Lvl: <7 mg/dL — ABNORMAL LOW (ref 7.0–30.0)

## 2022-07-09 MED ORDER — HYDROXYZINE HCL 25 MG PO TABS
50.0000 mg | ORAL_TABLET | Freq: Once | ORAL | Status: AC
  Administered 2022-07-09: 50 mg via ORAL
  Filled 2022-07-09: qty 2

## 2022-07-09 MED ORDER — HYDROXYZINE HCL 25 MG PO TABS: 50.0000 mg | ORAL_TABLET | Freq: Once | ORAL | Status: DC

## 2022-07-09 MED ORDER — ALUM & MAG HYDROXIDE-SIMETH 200-200-20 MG/5ML PO SUSP: 30.0000 mL | Freq: Four times a day (QID) | ORAL | Status: DC | PRN

## 2022-07-09 MED ORDER — ACETAMINOPHEN 325 MG PO TABS
650.0000 mg | ORAL_TABLET | ORAL | Status: DC | PRN
  Administered 2022-07-09 – 2022-07-10 (×3): 650 mg via ORAL
  Filled 2022-07-09 (×3): qty 2

## 2022-07-09 NOTE — ED Notes (Addendum)
Report to include Situation, Background, Assessment, and Recommendations received from [facility]. Patient alert and oriented, warm and dry, in no acute distress. Patient report passive SI and contracted for safety. Denied HI, AVH and pain. Patient made aware of Q15 minute rounds and security cameras for their safety. Patient instructed to come to me with needs or concerns.

## 2022-07-09 NOTE — ED Notes (Signed)
Snack and beverage given. 

## 2022-07-09 NOTE — ED Notes (Signed)
Karrie Meres   [phone]  Mobile Crisis Advocate for [employer]

## 2022-07-09 NOTE — ED Notes (Signed)
Pt belongings include (bag 1/1):  1 pink dress 1 gray bag  1 pair of brown sandals

## 2022-07-09 NOTE — ED Triage Notes (Signed)
Pt via BPD from home. Pt under IVC. Per IVC paperwork, pt has hx of depression and is suicidal. States she would cut herself. Denies any thoughts of hurting anyone else. Pt hasn't taken her medication in like a year. Denies any HI. Denies AVH. Pt is calm and cooperative.

## 2022-07-09 NOTE — ED Notes (Signed)
Patient became upset with staff and started yelling. The NP and this writer calm the patient down. She agreed to take oral medication to calm her down.

## 2022-07-09 NOTE — ED Provider Notes (Signed)
[facility] Provider Note    Event Date/Time   First MD Initiated Contact with Patient 07/09/22 1747     (approximate)   History   Chief Complaint: Psychiatric Evaluation (/)   HPI  [name] is a 36 y.o. female with a history of major depression, bipolar disorder, polysubstance abuse with cocaine, marijuana, alcohol, tobacco use who comes ED complaining of suicidal thoughts with a plan to stab herself with a knife.  She is worried that she is at imminent risk of harming herself.  She has not engaged in any self-injurious behavior thus far recently.  No HI or hallucinations.  No alcohol or drug use recently, no medication ingestions.  Reports that she has been out of her anti-depressive medicine for over a year.     Physical Exam   Triage Vital Signs: ED Triage Vitals  Enc Vitals Group     BP 07/09/22 1649 (!) 186/97     Pulse Rate 07/09/22 1649 100     Resp 07/09/22 1649 20     Temp 07/09/22 1655 98.1 F (36.7 C)     Temp Source 07/09/22 1655 Oral     SpO2 07/09/22 1649 100 %     Weight 07/09/22 1646 162 lb (73.5 kg)     Height 07/09/22 1646 4\' 11"  (1.499 m)     Head Circumference --      Peak Flow --      Pain Score 07/09/22 1646 0     Pain Loc --      Pain Edu? --      Excl. in GC? --     Most recent vital signs: Vitals:   07/09/22 1649 07/09/22 1655  BP: (!) 186/97   Pulse: 100   Resp: 20   Temp:  98.1 F (36.7 C)  SpO2: 100%     General: Awake, no distress.  CV:  Good peripheral perfusion.  Resp:  Normal effort.  Abd:  No distention.  Other:  Moist oral mucosa.  No wounds.   ED Results / Procedures / Treatments   Labs (all labs ordered are listed, but only abnormal results are displayed) Labs Reviewed  COMPREHENSIVE METABOLIC PANEL - Abnormal; Notable for the following components:      Result Value   CO2 21 (*)    Glucose, Bld 103 (*)    Calcium 8.7 (*)    AST 14 (*)    All other components within normal  limits  SALICYLATE LEVEL - Abnormal; Notable for the following components:   Salicylate Lvl <7.0 (*)    All other components within normal limits  ACETAMINOPHEN LEVEL - Abnormal; Notable for the following components:   Acetaminophen (Tylenol), Serum <10 (*)    All other components within normal limits  RESP PANEL BY RT-PCR (FLU A&B, COVID) ARPGX2  ETHANOL  CBC  URINE DRUG SCREEN, QUALITATIVE ([facility] ONLY)  POC URINE PREG, ED     EKG    RADIOLOGY    PROCEDURES:  Procedures   MEDICATIONS ORDERED IN ED: Medications  alum & mag hydroxide-simeth (MAALOX/MYLANTA) 200-200-20 MG/5ML suspension 30 mL (has no administration in time range)  acetaminophen (TYLENOL) tablet 650 mg (has no administration in time range)     IMPRESSION / MDM / ASSESSMENT AND PLAN / ED COURSE  I reviewed the triage vital signs and the nursing notes.  Patient presents with symptoms of depression.  She arrives under IVC.  We will continue involuntary hold pending psychiatry evaluation.  She has no acute medical complaints, exam is benign.  She is medically stable.  The patient has been placed in psychiatric observation due to the need to provide a safe environment for the patient while obtaining psychiatric consultation and evaluation, as well as ongoing medical and medication management to treat the patient's condition.  The patient has been placed under full IVC at this time.        FINAL CLINICAL IMPRESSION(S) / ED DIAGNOSES   Final diagnoses:  Severe episode of recurrent major depressive disorder, without psychotic features (HCC)     Rx / DC Orders   ED Discharge Orders     None        Note:  This document was prepared using Dragon voice recognition software and may include unintentional dictation errors.   Sharman Cheek, MD 07/09/22 1756

## 2022-07-10 DIAGNOSIS — F3164 Bipolar disorder, current episode mixed, severe, with psychotic features: Secondary | ICD-10-CM | POA: Diagnosis not present

## 2022-07-10 DIAGNOSIS — F332 Major depressive disorder, recurrent severe without psychotic features: Secondary | ICD-10-CM | POA: Diagnosis not present

## 2022-07-10 DIAGNOSIS — F1220 Cannabis dependence, uncomplicated: Secondary | ICD-10-CM

## 2022-07-10 DIAGNOSIS — F1420 Cocaine dependence, uncomplicated: Secondary | ICD-10-CM | POA: Diagnosis not present

## 2022-07-10 MED ORDER — ZIPRASIDONE MESYLATE 20 MG IM SOLR
20.0000 mg | Freq: Once | INTRAMUSCULAR | Status: DC
  Filled 2022-07-10: qty 20

## 2022-07-10 MED ORDER — LORAZEPAM 2 MG PO TABS
2.0000 mg | ORAL_TABLET | Freq: Once | ORAL | Status: AC
  Administered 2022-07-10: 2 mg via ORAL
  Filled 2022-07-10: qty 1

## 2022-07-10 NOTE — ED Notes (Signed)
Patient moved to Room 23

## 2022-07-10 NOTE — ED Notes (Signed)
Breakfast given at 930 

## 2022-07-10 NOTE — ED Provider Notes (Signed)
-----------------------------------------   8:10 PM on 07/10/2022 ----------------------------------------- Patient has become agitated in the BHU.  They are moving the patient back over to the main emergency department.  Per BHU nurse patient was throwing her food tray, yelling.  Patient is willing to take oral medication.  We will dose 2 mg of oral Ativan.  ----------------------------------------- 9:09 PM on 07/10/2022 ----------------------------------------- Patient is now gagging herself making herself vomit likely vomited the Ativan in addition to her dinner tray.  She has thrown a couple water at staff member.  Patient continues to be agitated we will dose 20 mg of IM Geodon for staff as well as patient's safety.   Minna Antis, MD 07/10/22 2220

## 2022-07-10 NOTE — ED Provider Notes (Signed)
Emergency Medicine Observation Re-evaluation Note  Vilda Zollner Folkert is a 36 y.o. female, seen on rounds today.   Physical Exam  BP (!) 156/88 (BP Location: Left Arm)   Pulse 80   Temp 97.6 F (36.4 C) (Oral)   Resp 16   Ht 4\' 11"  (1.499 m)   Wt 73.5 kg   LMP 05/28/2022   SpO2 98%   BMI 32.72 kg/m  Physical Exam General: Patient resting comfortably in bed Lungs: Patient not in respiratory distress Psych: Patient not combative  ED Course / MDM  EKG:     Plan  Current plan is for psych evaluation and treatment. Alizay Bronkema Fregia is under involuntary commitment. We will watch the patient's blood pressure is elevated today possibly will come down if she relaxes and adjust to being here.    [name], MD 07/10/22 [phone]

## 2022-07-10 NOTE — ED Notes (Signed)
Patient received supper tray, come out of room and states " I cannot eat this stuff and she started crying, nurse did order her another tray. Patient is safe, will continue to monitor.

## 2022-07-10 NOTE — ED Notes (Signed)
IVC PENDING  CONSULT ?

## 2022-07-10 NOTE — ED Notes (Signed)
Pt requesting shower, supplies given. Pt currently showering.
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
## 2022-07-10 NOTE — ED Notes (Signed)
Two RN's and a tech with security tried to give this pt a dinner tray kindly but patient refused and started crying and yelling that she did not want this nasty food after advocating to get her dinner. The pt asked for tylenol in the process and received the tylenol with the orange juice patient asked for, patient threw the medicine cup down then proceeded to slam the locked unit door and scream and shout, pt continued to act out by throwing the foam doors off of her room shouting "yall dont care about me why cant yall just let me out Im sick and tired of this fucking bullshit let me out," the patient was told that violence and anger outbursts with disrespect will not be tolerated in this facility . The patient went into her room then proceeded to bang around in the room then came out of the room into the day room where everyone could see . Patient tearful at this time and is calming down. Patient calm enough to move to the other side of the ER room 23 at this time.

## 2022-07-10 NOTE — ED Notes (Signed)
IVC pending consult   

## 2022-07-10 NOTE — ED Notes (Signed)
Patient took oral ativan and then threw up contents of stomach into food tray. EDP made aware.

## 2022-07-10 NOTE — ED Notes (Signed)
Patient laying in her bed yelling and moaning, nurse ask her what was wrong and she will not respond, also ask if I could get something for her, but no response , will continue to monitor.

## 2022-07-11 ENCOUNTER — Inpatient Hospital Stay
Admission: AD | Admit: 2022-07-11 | Discharge: 2022-07-14 | DRG: 885 | Disposition: A | Discharge status: Home / Self Care | Payer: No Typology Code available for payment source | Source: Intra-hospital | Attending: Psychiatry | Admitting: Psychiatry

## 2022-07-11 ENCOUNTER — Encounter: Payer: Self-pay | Admitting: Psychiatry

## 2022-07-11 ENCOUNTER — Other Ambulatory Visit: Payer: Self-pay

## 2022-07-11 DIAGNOSIS — G4700 Insomnia, unspecified: Secondary | ICD-10-CM | POA: Diagnosis present

## 2022-07-11 DIAGNOSIS — Z20822 Contact with and (suspected) exposure to covid-19: Secondary | ICD-10-CM | POA: Diagnosis present

## 2022-07-11 DIAGNOSIS — R45851 Suicidal ideations: Secondary | ICD-10-CM | POA: Diagnosis present

## 2022-07-11 DIAGNOSIS — Z87891 Personal history of nicotine dependence: Secondary | ICD-10-CM

## 2022-07-11 DIAGNOSIS — F1220 Cannabis dependence, uncomplicated: Secondary | ICD-10-CM | POA: Diagnosis present

## 2022-07-11 DIAGNOSIS — F1420 Cocaine dependence, uncomplicated: Secondary | ICD-10-CM | POA: Diagnosis not present

## 2022-07-11 DIAGNOSIS — F3164 Bipolar disorder, current episode mixed, severe, with psychotic features: Principal | ICD-10-CM | POA: Diagnosis present

## 2022-07-11 DIAGNOSIS — F209 Schizophrenia, unspecified: Secondary | ICD-10-CM | POA: Insufficient documentation

## 2022-07-11 DIAGNOSIS — Z91199 Patient's noncompliance with other medical treatment and regimen due to unspecified reason: Secondary | ICD-10-CM

## 2022-07-11 DIAGNOSIS — F332 Major depressive disorder, recurrent severe without psychotic features: Secondary | ICD-10-CM | POA: Diagnosis not present

## 2022-07-11 DIAGNOSIS — Z9152 Personal history of nonsuicidal self-harm: Secondary | ICD-10-CM

## 2022-07-11 DIAGNOSIS — I10 Essential (primary) hypertension: Secondary | ICD-10-CM | POA: Diagnosis present

## 2022-07-11 DIAGNOSIS — F1590 Other stimulant use, unspecified, uncomplicated: Secondary | ICD-10-CM | POA: Diagnosis present

## 2022-07-11 DIAGNOSIS — Z833 Family history of diabetes mellitus: Secondary | ICD-10-CM

## 2022-07-11 DIAGNOSIS — R4589 Other symptoms and signs involving emotional state: Secondary | ICD-10-CM | POA: Diagnosis present

## 2022-07-11 DIAGNOSIS — Z9151 Personal history of suicidal behavior: Secondary | ICD-10-CM

## 2022-07-11 DIAGNOSIS — F341 Dysthymic disorder: Secondary | ICD-10-CM | POA: Diagnosis present

## 2022-07-11 DIAGNOSIS — F419 Anxiety disorder, unspecified: Secondary | ICD-10-CM | POA: Diagnosis present

## 2022-07-11 LAB — CBG MONITORING, ED: Glucose-Capillary: 107 mg/dL — ABNORMAL HIGH (ref 70–99)

## 2022-07-11 MED ORDER — HALOPERIDOL LACTATE 5 MG/ML IJ SOLN: 5.0000 mg | Freq: Four times a day (QID) | INTRAMUSCULAR | Status: DC | PRN

## 2022-07-11 MED ORDER — HALOPERIDOL 5 MG PO TABS
5.0000 mg | ORAL_TABLET | Freq: Four times a day (QID) | ORAL | Status: DC | PRN
  Administered 2022-07-11: 5 mg via ORAL
  Filled 2022-07-11: qty 1

## 2022-07-11 MED ORDER — SERTRALINE HCL 25 MG PO TABS
50.0000 mg | ORAL_TABLET | Freq: Every day | ORAL | Status: DC
  Administered 2022-07-11 – 2022-07-14 (×4): 50 mg via ORAL
  Filled 2022-07-11 (×4): qty 2

## 2022-07-11 MED ORDER — DIPHENHYDRAMINE HCL 25 MG PO CAPS
50.0000 mg | ORAL_CAPSULE | Freq: Four times a day (QID) | ORAL | Status: DC | PRN
  Administered 2022-07-11: 50 mg via ORAL
  Filled 2022-07-11: qty 2

## 2022-07-11 MED ORDER — ALUM & MAG HYDROXIDE-SIMETH 200-200-20 MG/5ML PO SUSP: 30.0000 mL | Freq: Four times a day (QID) | ORAL | Status: DC | PRN

## 2022-07-11 MED ORDER — MAGNESIUM HYDROXIDE 400 MG/5ML PO SUSP: 30.0000 mL | Freq: Every day | ORAL | Status: DC | PRN

## 2022-07-11 MED ORDER — DIPHENHYDRAMINE HCL 50 MG/ML IJ SOLN: 50.0000 mg | Freq: Four times a day (QID) | INTRAMUSCULAR | Status: DC | PRN

## 2022-07-11 MED ORDER — HYDROXYZINE HCL 25 MG PO TABS
25.0000 mg | ORAL_TABLET | Freq: Three times a day (TID) | ORAL | Status: DC | PRN
  Administered 2022-07-12 – 2022-07-14 (×3): 25 mg via ORAL
  Filled 2022-07-11 (×3): qty 1

## 2022-07-11 MED ORDER — QUETIAPINE FUMARATE 100 MG PO TABS
100.0000 mg | ORAL_TABLET | Freq: Every day | ORAL | Status: DC
  Administered 2022-07-12: 100 mg via ORAL
  Filled 2022-07-11 (×2): qty 1

## 2022-07-11 MED ORDER — ACETAMINOPHEN 325 MG PO TABS
650.0000 mg | ORAL_TABLET | ORAL | Status: DC | PRN
  Administered 2022-07-11 – 2022-07-14 (×3): 650 mg via ORAL
  Filled 2022-07-11 (×3): qty 2

## 2022-07-11 NOTE — ED Notes (Signed)
Report given to BMU, waiting for call to transfer to inpatient unit

## 2022-07-11 NOTE — BH Assessment (Signed)
Patient is under review with [facility]. Awaiting accepting information.

## 2022-07-11 NOTE — Progress Notes (Signed)
Patient reports that she doesn't feel as if Zoloft works for her, "it just makes me sadder".

## 2022-07-11 NOTE — Group Note (Signed)
[facility] LCSW Group Therapy Note   Group Date: 07/11/2022 Start Time: 1300 End Time: 1400  Type of Therapy/Topic:  Group Therapy:  Feelings about Diagnosis  Participation Level:  Did Not Attend    Description of Group:    This group will allow patients to explore their thoughts and feelings about diagnoses they have received. Patients will be guided to explore their level of understanding and acceptance of these diagnoses. Facilitator will encourage patients to process their thoughts and feelings about the reactions of others to their diagnosis, and will guide patients in identifying ways to discuss their diagnosis with significant others in their lives. This group will be process-oriented, with patients participating in exploration of their own experiences as well as giving and receiving support and challenge from other group members.   Therapeutic Goals: 1. Patient will demonstrate understanding of diagnosis as evidence by identifying two or more symptoms of the disorder:  2. Patient will be able to express two feelings regarding the diagnosis 3. Patient will demonstrate ability to communicate their needs through discussion and/or role plays  Summary of Patient Progress: X   Therapeutic Modalities:   Cognitive Behavioral Therapy Brief Therapy Feelings Identification    Turon Kilmer R Zuriyah Shatz, LCSW 

## 2022-07-11 NOTE — Progress Notes (Signed)
Patient observed in her room crying to herself loudly while sitting on her bed. Patient engaged, offered support and encouragement, as well as PRN medications for comfort and safety, but patient just continued to cry very loudly while looking at the floor. Patient educated that when she was ready to talk and or utilize medications available to her, this writer would be available to assist her. Will continue to monitor closely.

## 2022-07-11 NOTE — ED Notes (Signed)
Pt again requested meal, staff took b'fast back into pt's room and explained that in the ED she is unable to place a specific food order.  Pt then took breakfast meal w/o further complaints

## 2022-07-11 NOTE — ED Notes (Signed)
IVC CONSULT DONE PATIENT PENDING PLACEMENT.

## 2022-07-11 NOTE — ED Provider Notes (Signed)
Emergency Medicine Observation Re-evaluation Note  [name] is a 36 y.o. female, seen on rounds today.  Pt initially presented to the ED for complaints of Psychiatric Evaluation (/) Currently, the patient is resting.  Physical Exam  BP (!) 146/74 (BP Location: Left Arm)   Pulse 77   Temp 97.7 F (36.5 C) (Oral)   Resp 18   Ht 4\' 11"  (1.499 m)   Wt 73.5 kg   LMP 05/28/2022   SpO2 99%   BMI 32.72 kg/m  Physical Exam Gen: No acute distress  Resp: Normal rise and fall of chest Neuro: Moving all four extremities Psych: Resting currently    ED Course / MDM  EKG:   I have reviewed the labs performed to date as well as medications administered while in observation.  Recent changes in the last 24 hours include required sedation during evening shift due to agitation.  Plan  Current plan is for psychiatric disposition. Aalivia Mcgraw Schellenberg is under involuntary commitment.      Cierah Crader, [name], DO 07/11/22 [id number]
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
## 2022-07-11 NOTE — ED Notes (Signed)
Patient received breakfast tray and stated "I'm not eating that crap"

## 2022-07-11 NOTE — H&P (Signed)
Psychiatric Admission Assessment Adult  Patient Identification: [name] MRN:  [id number] Date of Evaluation:  07/11/2022 Chief Complaint:  Schizophrenia (HCC) [F20.9] Principal Diagnosis: Bipolar disorder, curr episode mixed, severe, with psychotic features (HCC) Diagnosis:  Principal Problem:   Bipolar disorder, curr episode mixed, severe, with psychotic features (HCC) Active Problems:   Stimulant use disorder (HCC) (cocaine)   Cannabis use disorder, moderate, dependence (HCC)   Thoughts of self harm  History of Present Illness: Patient seen and chart reviewed.  Patient known from previous encounters.  36 year old woman with chronic mental health problems and substance abuse came to the hospital after calling crisis reporting suicidal ideation.  On presentation to the hospital continued to report suicidal ideation which she repeats in the interview with me.  She tells me that she has been off of her medicine ever since the last time she was in the hospital and that all she does is lay around at home.  She claims that she never eats does not do anything never leaves the house.  Focuses repeatedly on the fact that her children had been taken by DSS and that her mother passed away which happened more than a year ago.  Patient says she has suicidal thoughts but does not report any specific plan.  She denies using any drugs at all recently although her drug screen is positive for cannabis and cocaine. Associated Signs/Symptoms: Depression Symptoms:  depressed mood, anhedonia, insomnia, psychomotor retardation, hopelessness, suicidal thoughts without plan, anxiety, Duration of Depression Symptoms: Greater than two weeks  (Hypo) Manic Symptoms:  Distractibility, Impulsivity, Irritable Mood, Labiality of Mood, Anxiety Symptoms:  Excessive Worry, Psychotic Symptoms:  Paranoia, PTSD Symptoms: Negative Total Time spent with patient: 1 hour  Past Psychiatric History: Patient has a  history of mood instability.  History of cutting and self-injury when she was younger.  Longstanding noncompliance with outpatient treatment.  Longstanding history of relapsing into cocaine and cannabis use.  Diagnoses somewhat unclear.  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  She often will report psychotic symptoms and appear more disorganized acutely.  Medicines in the past have included both antipsychotics and antidepressants and mood stabilizers.  Unclear what has been most effective as she is usually noncompliant  Is the patient at risk to self? Yes.    Has the patient been a risk to self in the past 6 months? Yes.    Has the patient been a risk to self within the distant past? Yes.    Is the patient a risk to others? No.  Has the patient been a risk to others in the past 6 months? No.  Has the patient been a risk to others within the distant past? No.   Prior Inpatient Therapy:   Prior Outpatient Therapy:    Alcohol Screening: 1. How often do you have a drink containing alcohol?: Never 2. How many drinks containing alcohol do you have on a typical day when you are drinking?: 1 or 2 3. How often do you have six or more drinks on one occasion?: Never AUDIT-C Score: 0 4. How often during the last year have you found that you were not able to stop drinking once you had started?: Never 5. How often during the last year have you failed to do what was normally expected from you because of drinking?: Never 6. How often during the last year have you needed a first drink in the morning to get yourself going after a heavy drinking session?: Never 7.  How often during the last year have you had a feeling of guilt of remorse after drinking?: Never 8. How often during the last year have you been unable to remember what happened the night before because you had been drinking?: Never 9. Have you or someone else been injured as a result of your drinking?: No 10. Has a relative or  friend or a doctor or another health worker been concerned about your drinking or suggested you cut down?: No Alcohol Use Disorder Identification Test Final Score (AUDIT): 0 Alcohol Brief Interventions/Follow-up: Alcohol education/Brief advice Substance Abuse History in the last 12 months:  Yes.   Consequences of Substance Abuse: Patient will not admit to it even though the drug screen is positive.  Substance abuse has always been a major part of her problem Previous Psychotropic Medications: Yes  Psychological Evaluations: Yes  Past Medical History:  Past Medical History:  Diagnosis Date   Anxiety    Depression    Gestational diabetes    History of substance abuse (HCC)    History of suicide attempt    History of thyroid disease    Hypertension    Thyroid disease     Past Surgical History:  Procedure Laterality Date   CESAREAN SECTION  2000, 2002, 2013   WISDOM TOOTH EXTRACTION     Family History:  Family History  Problem Relation Age of Onset   Diabetes Mother    Family Psychiatric  History: None reported Tobacco Screening:   Social History:  Social History   Substance and Sexual Activity  Alcohol Use Not Currently   Comment: social drinker     Social History   Substance and Sexual Activity  Drug Use Not Currently   Types: Cocaine, Marijuana   Comment: Positive UDS 12/2017, last used mj 3 days ago , last used cocaine  a couple of months ago     Additional Social History:                           Allergies:   Allergies  Allergen Reactions   Aspirin Anaphylaxis, Diarrhea and Nausea And Vomiting   Banana Anaphylaxis, Itching, Shortness Of Breath and Swelling   Other Itching, Shortness Of Breath and Swelling   Peanut Oil Itching, Shortness Of Breath and Swelling   Peanut-Containing Drug Products Shortness Of Breath, Itching and Swelling   Pecan Nut (Diagnostic) Anaphylaxis   Latex Itching   Lab Results:  Results for orders placed or performed  during the hospital encounter of 07/09/22 (from the past 48 hour(s))  Comprehensive metabolic panel     Status: Abnormal   Collection Time: 07/09/22  4:48 PM  Result Value Ref Range   Sodium 137 135 - 145 mmol/L   Potassium 3.9 3.5 - 5.1 mmol/L   Chloride 107 98 - 111 mmol/L   CO2 21 (L) 22 - 32 mmol/L   Glucose, Bld 103 (H) 70 - 99 mg/dL    Comment: Glucose reference range applies only to samples taken after fasting for at least 8 hours.   BUN 16 6 - 20 mg/dL   Creatinine, Ser 1.19 0.44 - 1.00 mg/dL   Calcium 8.7 (L) 8.9 - 10.3 mg/dL   Total Protein 7.7 6.5 - 8.1 g/dL   Albumin 4.0 3.5 - 5.0 g/dL   AST 14 (L) 15 - 41 U/L   ALT 11 0 - 44 U/L   Alkaline Phosphatase 55 38 - 126 U/L   Total  Bilirubin 0.7 0.3 - 1.2 mg/dL   GFR, Estimated >16>60 >10>60 mL/min    Comment: (NOTE) Calculated using the CKD-EPI Creatinine Equation (2021)    Anion gap 9 5 - 15    Comment: Performed at [facility], [address] [place], [place] [address]  Salicylate level     Status: Abnormal   Collection Time: 07/09/22  4:48 PM  Result Value Ref Range   Salicylate Lvl <7.0 (L) 7.0 - 30.0 mg/dL    Comment: Performed at [facility], [address] [place], [place] [address]  Acetaminophen level     Status: Abnormal   Collection Time: 07/09/22  4:48 PM  Result Value Ref Range   Acetaminophen (Tylenol), Serum <10 (L) 10 - 30 ug/mL    Comment: (NOTE) Therapeutic concentrations vary significantly. A range of 10-30 ug/mL  may be an effective concentration for many patients. However, some  are best treated at concentrations outside of this range. Acetaminophen concentrations >150 ug/mL at 4 hours after ingestion  and >50 ug/mL at 12 hours after ingestion are often associated with  toxic reactions.  Performed at [facility], [address] [place], [place] [address]   cbc     Status: None   Collection Time: 07/09/22  4:48 PM  Result Value Ref Range   WBC 6.8 4.0 - 10.5 K/uL    RBC 4.69 3.87 - 5.11 MIL/uL   Hemoglobin 13.5 12.0 - 15.0 g/dL   HCT 82.942.8 56.236.0 - 13.046.0 %   MCV 91.3 80.0 - 100.0 fL   MCH 28.8 26.0 - 34.0 pg   MCHC 31.5 30.0 - 36.0 g/dL   RDW 86.513.7 78.411.5 - 69.615.5 %   Platelets 232 150 - 400 K/uL   nRBC 0.0 0.0 - 0.2 %    Comment: Performed at [facility], [address] [place], [place] [address]  Urine Drug Screen, Qualitative     Status: Abnormal   Collection Time: 07/09/22  4:57 PM  Result Value Ref Range   Tricyclic, Ur Screen NONE DETECTED NONE DETECTED   Amphetamines, Ur Screen NONE DETECTED NONE DETECTED   MDMA (Ecstasy)Ur Screen NONE DETECTED NONE DETECTED   Cocaine Metabolite,Ur [place] POSITIVE (A) NONE DETECTED   Opiate, Ur Screen NONE DETECTED NONE DETECTED   Phencyclidine (PCP) Ur S NONE DETECTED NONE DETECTED   Cannabinoid 50 Ng, Ur [place] POSITIVE (A) NONE DETECTED   Barbiturates, Ur Screen NONE DETECTED NONE DETECTED   Benzodiazepine, Ur Scrn NONE DETECTED NONE DETECTED   Methadone Scn, Ur NONE DETECTED NONE DETECTED    Comment: (NOTE) Tricyclics + metabolites, urine    Cutoff 1000 ng/mL Amphetamines + metabolites, urine  Cutoff 1000 ng/mL MDMA (Ecstasy), urine              Cutoff 500 ng/mL Cocaine Metabolite, urine          Cutoff 300 ng/mL Opiate + metabolites, urine        Cutoff 300 ng/mL Phencyclidine (PCP), urine         Cutoff 25 ng/mL Cannabinoid, urine                 Cutoff 50 ng/mL Barbiturates + metabolites, urine  Cutoff 200 ng/mL Benzodiazepine, urine              Cutoff 200 ng/mL Methadone, urine                   Cutoff 300 ng/mL  The urine drug screen  provides only a preliminary, unconfirmed analytical test result and should not be used for non-medical purposes. Clinical consideration and professional judgment should be applied to any positive drug screen result due to possible interfering substances. A more specific alternate chemical method must be used in order to obtain a confirmed analytical  result. Gas chromatography / mass spectrometry (GC/MS) is the preferred confirm atory method. Performed at [facility], [address] [place], [place] [address]   Resp Panel by RT-PCR (Flu A&B, Covid) Anterior Nasal Swab     Status: None   Collection Time: 07/09/22  4:57 PM   Specimen: Anterior Nasal Swab  Result Value Ref Range   SARS Coronavirus 2 by RT PCR NEGATIVE NEGATIVE    Comment: (NOTE) SARS-CoV-2 target nucleic acids are NOT DETECTED.  The SARS-CoV-2 RNA is generally detectable in upper respiratory specimens during the acute phase of infection. The lowest concentration of SARS-CoV-2 viral copies this assay can detect is 138 copies/mL. A negative result does not preclude SARS-Cov-2 infection and should not be used as the sole basis for treatment or other patient management decisions. A negative result may occur with  improper specimen collection/handling, submission of specimen other than nasopharyngeal swab, presence of viral mutation(s) within the areas targeted by this assay, and inadequate number of viral copies(<138 copies/mL). A negative result must be combined with clinical observations, patient history, and epidemiological information. The expected result is Negative.  Fact Sheet for Patients:  BloggerCourse.com  Fact Sheet for Healthcare Providers:  SeriousBroker.it  This test is no t yet approved or cleared by the [place] FDA and  has been authorized for detection and/or diagnosis of SARS-CoV-2 by FDA under an Emergency Use Authorization (EUA). This EUA will remain  in effect (meaning this test can be used) for the duration of the COVID-19 declaration under Section 564(b)(1) of the Act, 21 U.S.C.section 360bbb-3(b)(1), unless the authorization is terminated  or revoked sooner.       Influenza A by PCR NEGATIVE NEGATIVE   Influenza B by PCR NEGATIVE NEGATIVE    Comment: (NOTE) The Xpert  Xpress SARS-CoV-2/FLU/RSV plus assay is intended as an aid in the diagnosis of influenza from Nasopharyngeal swab specimens and should not be used as a sole basis for treatment. Nasal washings and aspirates are unacceptable for Xpert Xpress SARS-CoV-2/FLU/RSV testing.  Fact Sheet for Patients: BloggerCourse.com  Fact Sheet for Healthcare Providers: SeriousBroker.it  This test is not yet approved or cleared by the [place] FDA and has been authorized for detection and/or diagnosis of SARS-CoV-2 by FDA under an Emergency Use Authorization (EUA). This EUA will remain in effect (meaning this test can be used) for the duration of the COVID-19 declaration under Section 564(b)(1) of the Act, 21 U.S.C. section 360bbb-3(b)(1), unless the authorization is terminated or revoked.  Performed at [facility], [address] [place], [place] [address]   Ethanol     Status: None   Collection Time: 07/09/22  4:59 PM  Result Value Ref Range   Alcohol, Ethyl (B) <10 <10 mg/dL    Comment: (NOTE) Lowest detectable limit for serum alcohol is 10 mg/dL.  For medical purposes only. Performed at [facility], [address] [place], [place] [address]   POC urine preg, ED     Status: None   Collection Time: 07/09/22  5:27 PM  Result Value Ref Range   Preg Test, Ur NEGATIVE NEGATIVE    Comment:        THE SENSITIVITY  OF THIS METHODOLOGY IS >24 mIU/mL   CBG monitoring, ED     Status: Abnormal   Collection Time: 07/11/22 10:56 AM  Result Value Ref Range   Glucose-Capillary 107 (H) 70 - 99 mg/dL    Comment: Glucose reference range applies only to samples taken after fasting for at least 8 hours.    Blood Alcohol level:  Lab Results  Component Value Date   ETH <10 07/09/2022   ETH 31 (H) 10/30/2021    Metabolic Disorder Labs:  Lab Results  Component Value Date   HGBA1C 5.4 07/21/2020   MPG 108 07/21/2020   MPG  111.15 06/05/2020   No results found for: "PROLACTIN" Lab Results  Component Value Date   CHOL 218 (H) 01/02/2018   TRIG 187 (H) 01/02/2018   HDL 81 01/02/2018   CHOLHDL 2.7 01/02/2018   VLDL 37 01/02/2018   LDLCALC 100 (H) 01/02/2018   LDLCALC 82 01/12/2016    Current Medications: Current Facility-Administered Medications  Medication Dose Route Frequency Provider Last Rate Last Admin   acetaminophen (TYLENOL) tablet 650 mg  650 mg Oral Q4H PRN Vanetta Mulders, NP   650 mg at 07/11/22 1446   alum & mag hydroxide-simeth (MAALOX/MYLANTA) 200-200-20 MG/5ML suspension 30 mL  30 mL Oral Q6H PRN Vanetta Mulders, NP       diphenhydrAMINE (BENADRYL) capsule 50 mg  50 mg Oral Q6H PRN Toshiye Kever, Jackquline Denmark, MD   50 mg at 07/11/22 1600   Or   diphenhydrAMINE (BENADRYL) injection 50 mg  50 mg Intramuscular Q6H PRN Arlynn Mcdermid, Jackquline Denmark, MD       haloperidol (HALDOL) tablet 5 mg  5 mg Oral Q6H PRN Shondell Fabel, Jackquline Denmark, MD   5 mg at 07/11/22 1600   Or   haloperidol lactate (HALDOL) injection 5 mg  5 mg Intramuscular Q6H PRN Leiah Giannotti, Jackquline Denmark, MD       hydrOXYzine (ATARAX) tablet 25 mg  25 mg Oral TID PRN Vanetta Mulders, NP       magnesium hydroxide (MILK OF MAGNESIA) suspension 30 mL  30 mL Oral Daily PRN Vanetta Mulders, NP       PTA Medications: Medications Prior to Admission  Medication Sig Dispense Refill Last Dose   acetaminophen (TYLENOL) 325 MG tablet Take 650 mg by mouth every 6 (six) hours as needed for pain. (Patient not taking: Reported on 07/09/2022)      hydrochlorothiazide (HYDRODIURIL) 25 MG tablet Take 1 tablet (25 mg total) by mouth daily. (Patient not taking: Reported on 07/09/2022) 30 tablet 1     Musculoskeletal: Strength & Muscle Tone: within normal limits Gait & Station: normal Patient leans: N/A            Psychiatric Specialty Exam:  Presentation  General Appearance: Bizarre  Eye Contact:Minimal  Speech:Clear and Coherent  Speech  Volume:Increased  Handedness:Right   Mood and Affect  Mood:Angry; Anxious; Dysphoric; Irritable; Labile  Affect:Depressed; Full Range; Tearful   Thought Process  Thought Processes:Coherent  Duration of Psychotic Symptoms: No data recorded Past Diagnosis of Schizophrenia or Psychoactive disorder: No  Descriptions of Associations:Intact  Orientation:Full (Time, Place and Person)  Thought Content:Paranoid Ideation  Hallucinations:Hallucinations: None  Ideas of Reference:None  Suicidal Thoughts:Suicidal Thoughts: Yes, Passive  Homicidal Thoughts:Homicidal Thoughts: No   Sensorium  Memory:Remote Fair; Immediate Fair  Judgment:Poor  Insight:Poor   Executive Functions  Concentration:Poor  Attention Span:Poor  Recall:Poor  Fund of Knowledge:Poor  Language:Fair   Psychomotor Activity  Psychomotor Activity:Psychomotor Activity:  Normal   Assets  Assets:Communication Skills; Desire for Improvement; Housing   Sleep  Sleep:Sleep: Fair    Physical Exam: Physical Exam Vitals and nursing note reviewed.  Constitutional:      Appearance: Normal appearance.  HENT:     Head: Normocephalic and atraumatic.     Mouth/Throat:     Pharynx: Oropharynx is clear.  Eyes:     Pupils: Pupils are equal, round, and reactive to light.  Cardiovascular:     Rate and Rhythm: Normal rate and regular rhythm.  Pulmonary:     Effort: Pulmonary effort is normal.     Breath sounds: Normal breath sounds.  Abdominal:     General: Abdomen is flat.     Palpations: Abdomen is soft.  Musculoskeletal:        General: Normal range of motion.  Skin:    General: Skin is warm and dry.  Neurological:     General: No focal deficit present.     Mental Status: She is alert. Mental status is at baseline.  Psychiatric:        Attention and Perception: She is inattentive.        Mood and Affect: Affect is labile and inappropriate.        Speech: Speech is tangential.        Behavior:  Behavior is agitated.        Thought Content: Thought content is paranoid. Thought content includes suicidal ideation. Thought content does not include suicidal plan.        Cognition and Memory: Cognition is impaired.    Review of Systems  Constitutional: Negative.   HENT: Negative.    Eyes: Negative.   Respiratory: Negative.    Cardiovascular: Negative.   Gastrointestinal: Negative.   Musculoskeletal: Negative.   Skin: Negative.   Neurological: Negative.   Psychiatric/Behavioral:  Positive for depression and suicidal ideas. Negative for hallucinations and substance abuse. The patient is nervous/anxious and has insomnia.    Blood pressure 140/90, pulse 84, temperature 99 F (37.2 C), temperature source Oral, resp. rate 18, height  (1.499 m), weight 72.6 kg, last menstrual period 05/28/2022, SpO2 100 %, not currently breastfeeding. Body mass index is 32.32 kg/m.  Treatment Plan Summary: Medication management and Plan patient became agitated during the interview which was somewhat inexplicable.  Started out calm although a little tearful but then rapidly escalated and started shouting obscenities.  Reported that she did not want to be in our hospital and demanded that I immediately transfer her to [facility].  I explained that since she is not pregnant at this point there is no rationale for a transfer to another hospital.  Patient became irate stating that she would not take any medicine or cooperate with any of our treatment.  Orders placed to restart Seroquel and Zoloft which had been used when she was at [facility] for her last admission.  Hope that she will calm down and engage in some useful treatment and that we can feel more comfortable about discharging her when she is not actively agitated or suicidal  Observation Level/Precautions:  15 minute checks  Laboratory:  Chemistry Profile  Psychotherapy:    Medications:    Consultations:    Discharge Concerns:    Estimated LOS:   Other:     Physician Treatment Plan for Primary Diagnosis: Bipolar disorder, curr episode mixed, severe, with psychotic features (HCC) Long Term Goal(s): Improvement in symptoms so as ready for discharge  Short Term Goals: Ability to  verbalize feelings will improve and Ability to disclose and discuss suicidal ideas  Physician Treatment Plan for Secondary Diagnosis: Principal Problem:   Bipolar disorder, curr episode mixed, severe, with psychotic features (HCC) Active Problems:   Stimulant use disorder (HCC) (cocaine)   Cannabis use disorder, moderate, dependence (HCC)   Thoughts of self harm  Long Term Goal(s): Improvement in symptoms so as ready for discharge  Short Term Goals: Compliance with prescribed medications will improve  I certify that inpatient services furnished can reasonably be expected to improve the patient's condition.    Mordecai Rasmussen, MD 7/18/20234:11 PM

## 2022-07-11 NOTE — ED Notes (Signed)
Report received from Vanessa A , RN including SBAR. On initial round after report Pt is warm/dry, resting quietly in room without any s/s of distress.  Will continue to monitor throughout shift as ordered for any changes in behaviors and for continued safety.   

## 2022-07-11 NOTE — Tx Team (Signed)
Initial Treatment Plan 07/11/2022 2:36 PM [name] AQT:[id number]    PATIENT STRESSORS: Financial difficulties   Loss of Family member   Substance abuse     PATIENT STRENGTHS: Motivation for treatment/growth  Physical Health  Special hobby/interest    PATIENT IDENTIFIED PROBLEMS: Suicidal thoughts 07/11/2022  Substance Abuse 07/11/2022                   DISCHARGE CRITERIA:  Improved stabilization in mood, thinking, and/or behavior Motivation to continue treatment in a less acute level of care Need for constant or close observation no longer present Safe-care adequate arrangements made Verbal commitment to aftercare and medication compliance  PRELIMINARY DISCHARGE PLAN: Outpatient therapy Return to previous living arrangement Return to previous work or school arrangements  PATIENT/FAMILY INVOLVEMENT: This treatment plan has been presented to and reviewed with the patient, [name], and/or family member.  The patient has had the opportunity to ask questions and make suggestions.  Lenox Ponds, RN 07/11/2022, 2:36 PM

## 2022-07-11 NOTE — ED Notes (Signed)
pt agreeable for inpatient services in BMU, all pt belongings accounted for and sent with security.  pt is a/o x3 denies a/v hallucinations stated passive si w/o plan

## 2022-07-11 NOTE — Progress Notes (Addendum)
Received patient from [facility] Emergency Department. Patient skin assessment completed with Cara, BHT, skin is intact, no contraband found with all unit prohibited items locked and stored away for discharge. Pt. Was admitted under the services of, Dr. Toni Amend.  Patient oriented to unit/room/call light. Pt. Given extensive admissions education. Patient was encourage to participate in unit activities and continue with plan of care being put into place. Q x 15 minute observation checks were initiated for safety.   Patient is receptive to treatment plan being put into place and safety to be maintained on unit per MD orders. Patient endorsed she has been homeless, and because of this, she has been feeling increasingly suicidal. Patient endorsed despite suicidal thoughts and thoughts of death and dying, she has an ability to not harm self on the unit. Patient given meal tray to eat per patient request.

## 2022-07-11 NOTE — BH Assessment (Signed)
Patient is to be admitted to [facility] by Dr. Toni Amend.  Attending Physician will be Dr.  Toni Amend .   Patient has been assigned to room 320, by [facility] Charge Nurse Joesiah Lonon.    ER staff is aware of the admission: Drinda Butts, ER Secretary   Dr. Darnelle Catalan, ER MD  Florentina Addison, Patient's Nurse  Sue Lush, Patient Access.

## 2022-07-11 NOTE — Progress Notes (Signed)
Patient isolative to her room. Staff inquired if she would be interested in getting snack this evening, but patient declined.  Stated that she does not have an appetite at the moment. Patient laying in the bed with her head under the cover. She denies si/hi/avh and anxiety at this encounter. She does endorse depression rating 10/10 at this encounter. Endorses helplessness and hopelessness.  Reports not having any support and not knowing what to do regarding her children. She then states that she doesn't want to talk right now and pulled the cover up over head even more. Staff offered comfort and support. Advised if she need to talk that staff was available. Will continue to monitor with q 15 minute safety checks.     C Butler-Nicholson, LPN
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
## 2022-07-11 NOTE — Plan of Care (Signed)
Care Plan Initiated  Problem: Education: Goal: Knowledge of General Education information will improve Description: Including pain rating scale, medication(s)/side effects and non-pharmacologic comfort measures Outcome: Progressing   Problem: Safety: Goal: Ability to remain free from injury will improve Outcome: Progressing   Problem: Coping: Goal: Coping ability will improve Outcome: Progressing   Problem: Medication: Goal: Compliance with prescribed medication regimen will improve Outcome: Progressing   Problem: Self-Concept: Goal: Ability to disclose and discuss suicidal ideas will improve Outcome: Progressing

## 2022-07-12 DIAGNOSIS — F3164 Bipolar disorder, current episode mixed, severe, with psychotic features: Secondary | ICD-10-CM | POA: Diagnosis not present

## 2022-07-12 NOTE — [facility] Group Notes (Signed)
[facility] Group Notes:  (Nursing/MHT/Case Management/Adjunct)  Date:  07/12/2022  Time:  9:38 AM  Type of Therapy:   community meeting  Participation Level:  Did Not Attend   [name] [facility] 07/12/2022, 9:38 AM

## 2022-07-12 NOTE — BH IP Treatment Plan (Signed)
Interdisciplinary Treatment and Diagnostic Plan Update  07/12/2022 Time of Session: 9:30AM Bianca Raneri Membreno MRN: [id number]  Principal Diagnosis: Bipolar disorder, curr episode mixed, severe, with psychotic features ([place])  Secondary Diagnoses: Principal Problem:   Bipolar disorder, curr episode mixed, severe, with psychotic features ([place]) Active Problems:   Stimulant use disorder ([place]) (cocaine)   Cannabis use disorder, moderate, dependence ([place])   Thoughts of self harm   Current Medications:  Current Facility-Administered Medications  Medication Dose Route Frequency Provider Last Rate Last Admin   acetaminophen (TYLENOL) tablet 650 mg  650 mg Oral Q4H PRN Waldon Merl F, NP   650 mg at 07/11/22 1446   alum & mag hydroxide-simeth (MAALOX/MYLANTA) 200-200-20 MG/5ML suspension 30 mL  30 mL Oral Q6H PRN Waldon Merl F, NP       diphenhydrAMINE (BENADRYL) capsule 50 mg  50 mg Oral Q6H PRN Clapacs, John T, MD   50 mg at 07/11/22 1600   Or   diphenhydrAMINE (BENADRYL) injection 50 mg  50 mg Intramuscular Q6H PRN Clapacs, John T, MD       haloperidol (HALDOL) tablet 5 mg  5 mg Oral Q6H PRN Clapacs, John T, MD   5 mg at 07/11/22 1600   Or   haloperidol lactate (HALDOL) injection 5 mg  5 mg Intramuscular Q6H PRN Clapacs, Madie Reno, MD       hydrOXYzine (ATARAX) tablet 25 mg  25 mg Oral TID PRN Sherlon Handing, NP   25 mg at 07/12/22 0806   magnesium hydroxide (MILK OF MAGNESIA) suspension 30 mL  30 mL Oral Daily PRN Waldon Merl F, NP       QUEtiapine (SEROQUEL) tablet 100 mg  100 mg Oral QHS Clapacs, John T, MD       sertraline (ZOLOFT) tablet 50 mg  50 mg Oral Daily Clapacs, John T, MD   50 mg at 07/12/22 0806   PTA Medications: Medications Prior to Admission  Medication Sig Dispense Refill Last Dose   acetaminophen (TYLENOL) 325 MG tablet Take 650 mg by mouth every 6 (six) hours as needed for pain. (Patient not taking: Reported on 07/09/2022)      hydrochlorothiazide  (HYDRODIURIL) 25 MG tablet Take 1 tablet (25 mg total) by mouth daily. (Patient not taking: Reported on 07/09/2022) 30 tablet 1     Patient Stressors: Financial difficulties   Loss of Family member   Substance abuse    Patient Strengths: Motivation for treatment/growth  Physical Health  Special hobby/interest   Treatment Modalities: Medication Management, Group therapy, Case management,  1 to 1 session with clinician, Psychoeducation, Recreational therapy.   Physician Treatment Plan for Primary Diagnosis: Bipolar disorder, curr episode mixed, severe, with psychotic features ([place]) Long Term Goal(s): Improvement in symptoms so as ready for discharge   Short Term Goals: Compliance with prescribed medications will improve Ability to verbalize feelings will improve Ability to disclose and discuss suicidal ideas  Medication Management: Evaluate patient's response, side effects, and tolerance of medication regimen.  Therapeutic Interventions: 1 to 1 sessions, Unit Group sessions and Medication administration.  Evaluation of Outcomes: Not Met  Physician Treatment Plan for Secondary Diagnosis: Principal Problem:   Bipolar disorder, curr episode mixed, severe, with psychotic features ([place]) Active Problems:   Stimulant use disorder (HCC) (cocaine)   Cannabis use disorder, moderate, dependence ([place])   Thoughts of self harm  Long Term Goal(s): Improvement in symptoms so as ready for discharge   Short Term Goals: Compliance with prescribed medications will improve  Ability to verbalize feelings will improve Ability to disclose and discuss suicidal ideas     Medication Management: Evaluate patient's response, side effects, and tolerance of medication regimen.  Therapeutic Interventions: 1 to 1 sessions, Unit Group sessions and Medication administration.  Evaluation of Outcomes: Not Met   RN Treatment Plan for Primary Diagnosis: Bipolar disorder, curr episode mixed, severe, with  psychotic features ([place]) Long Term Goal(s): Knowledge of disease and therapeutic regimen to maintain health will improve  Short Term Goals: Ability to remain free from injury will improve, Ability to verbalize frustration and anger appropriately will improve, Ability to demonstrate self-control, Ability to participate in decision making will improve, Ability to verbalize feelings will improve, Ability to disclose and discuss suicidal ideas, Ability to identify and develop effective coping behaviors will improve, and Compliance with prescribed medications will improve  Medication Management: RN will administer medications as ordered by provider, will assess and evaluate patient's response and provide education to patient for prescribed medication. RN will report any adverse and/or side effects to prescribing provider.  Therapeutic Interventions: 1 on 1 counseling sessions, Psychoeducation, Medication administration, Evaluate responses to treatment, Monitor vital signs and CBGs as ordered, Perform/monitor CIWA, COWS, AIMS and Fall Risk screenings as ordered, Perform wound care treatments as ordered.  Evaluation of Outcomes: Not Met   LCSW Treatment Plan for Primary Diagnosis: Bipolar disorder, curr episode mixed, severe, with psychotic features ([place]) Long Term Goal(s): Safe transition to appropriate next level of care at discharge, Engage patient in therapeutic group addressing interpersonal concerns.  Short Term Goals: Engage patient in aftercare planning with referrals and resources, Increase social support, Increase ability to appropriately verbalize feelings, Increase emotional regulation, Facilitate acceptance of mental health diagnosis and concerns, Facilitate patient progression through stages of change regarding substance use diagnoses and concerns, Identify triggers associated with mental health/substance abuse issues, and Increase skills for wellness and recovery  Therapeutic Interventions:  Assess for all discharge needs, 1 to 1 time with Social worker, Explore available resources and support systems, Assess for adequacy in community support network, Educate family and significant other(s) on suicide prevention, Complete Psychosocial Assessment, Interpersonal group therapy.  Evaluation of Outcomes: Not Met   Progress in Treatment: Attending groups: No. Participating in groups: No. Taking medication as prescribed: Yes. Toleration medication: Yes. Family/Significant other contact made: No, will contact:  once permission is given. Patient understands diagnosis: Yes. Discussing patient identified problems/goals with staff: Yes. Medical problems stabilized or resolved: Yes. Denies suicidal/homicidal ideation: Yes. Issues/concerns per patient self-inventory: No. Other: none  New problem(s) identified: No, Describe:  none  New Short Term/Long Term Goal(s): detox, medication management for mood stabilization; elimination of SI thoughts; development of comprehensive mental wellness/sobriety plan.   Patient Goals:  "I don't want to tell y'all anything, y'all are rats and snakes. I'm not going to tell you any of my business."  Discharge Plan or Barriers: CSW to assist patient in development of appropriate discharge plans.   Reason for Continuation of Hospitalization: Aggression Anxiety Depression Medication stabilization Suicidal ideation  Estimated Length of Stay:  1-7 days  Last 3 [place] Suicide Severity Risk Score: Flowsheet Row Admission (Current) from 07/11/2022 in [facility] from 07/09/2022 in [facility] from 02/05/2022 in [place] RISK CATEGORY Low Risk Low Risk No Risk       Last PHQ 2/9 Scores:    11/26/2020    2:10 PM  Depression screen [facility] 2/9  Decreased Interest 0  Down, Depressed, Hopeless 0  PHQ - 2 Score 0    Scribe for  Treatment Team: Rozann Lesches, Marlinda Mike 07/12/2022 9:52 AM

## 2022-07-12 NOTE — Progress Notes (Signed)
[facility] MD Progress Note  07/12/2022 2:03 PM [name]  MRN:  [id number] Subjective: Patient seen and chart reviewed.  She came to treatment team today but would not really cooperate with [place].  Affect was smiling but she seemed to just enjoyed telling us all that she did not need our help.  Went to see her later in the afternoon and she was a little more appropriate saying she needed help with her depression but not being much more specific.  She is taking medications so far and not acting out aggressively. 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  Past Medical History:  Past Medical History:  Diagnosis Date   Anxiety    Depression    Gestational diabetes    History of substance abuse (HCC)    History of suicide attempt    History of thyroid disease    Hypertension    Thyroid disease     Past Surgical History:  Procedure Laterality Date   CESAREAN SECTION  2000, 2002, 2013   WISDOM TOOTH EXTRACTION     Family History:  Family History  Problem Relation Age of Onset   Diabetes Mother    Family Psychiatric  History: See previous Social History:  Social History   Substance and Sexual Activity  Alcohol Use Not Currently   Comment: social drinker     Social History   Substance and Sexual Activity  Drug Use Not Currently   Types: Cocaine, Marijuana   Comment: Positive UDS 12/2017, last used mj 3 days ago , last used cocaine  a couple of months ago     Social History   Socioeconomic History   Marital  status: Single    Spouse name: Not on file   Number of children: Not on file   Years of education: Not on file   Highest education level: Not on file  Occupational History   Not on file  Tobacco Use   Smoking status: Former    Packs/day: 0.25    Types: Cigarettes   Smokeless tobacco: Never  Vaping Use   Vaping Use: Never used  Substance and Sexual Activity   Alcohol use: Not Currently    Comment: social drinker   Drug use: Not Currently    Types: Cocaine, Marijuana    Comment: Positive UDS 12/2017, last used mj 3 days ago , last used cocaine  a couple of months ago    Sexual activity: Yes    Birth control/protection: None  Other Topics Concern   Not on file  Social History Narrative   Not on file   Social Determinants of Health   Financial Resource Strain: Not on file  Food Insecurity: Not on file  Transportation Needs: Not on file  Physical Activity: Not on file  Stress: Not on file  Social Connections: Not on file   Additional Social History:                         Sleep: Fair  Appetite:  Fair  Current Medications: Current Facility-Administered Medications  Medication Dose Route Frequency  Provider Last Rate Last Admin   acetaminophen (TYLENOL) tablet 650 mg  650 mg Oral Q4H PRN Gabriel Cirri F, NP   650 mg at 07/11/22 1446   alum & mag hydroxide-simeth (MAALOX/MYLANTA) 200-200-20 MG/5ML suspension 30 mL  30 mL Oral Q6H PRN Gabriel Cirri F, NP       diphenhydrAMINE (BENADRYL) capsule 50 mg  50 mg Oral Q6H PRN Charee Tumblin T, MD   50 mg at 07/11/22 1600   Or   diphenhydrAMINE (BENADRYL) injection 50 mg  50 mg Intramuscular Q6H PRN Geraldine Sandberg T, MD       haloperidol (HALDOL) tablet 5 mg  5 mg Oral Q6H PRN Novali Vollman T, MD   5 mg at 07/11/22 1600   Or   haloperidol lactate (HALDOL) injection 5 mg  5 mg Intramuscular Q6H PRN Secret Kristensen, Jackquline Denmark, MD       hydrOXYzine (ATARAX) tablet 25 mg  25 mg Oral TID PRN Vanetta Mulders, NP   25 mg at  07/12/22 0806   magnesium hydroxide (MILK OF MAGNESIA) suspension 30 mL  30 mL Oral Daily PRN Gabriel Cirri F, NP       QUEtiapine (SEROQUEL) tablet 100 mg  100 mg Oral QHS Gumecindo Hopkin T, MD       sertraline (ZOLOFT) tablet 50 mg  50 mg Oral Daily Geral Tuch, Jackquline Denmark, MD   50 mg at 07/12/22 3154    Lab Results:  Results for orders placed or performed during the hospital encounter of 07/09/22 (from the past 48 hour(s))  CBG monitoring, ED     Status: Abnormal   Collection Time: 07/11/22 10:56 AM  Result Value Ref Range   Glucose-Capillary 107 (H) 70 - 99 mg/dL    Comment: Glucose reference range applies only to samples taken after fasting for at least 8 hours.    Blood Alcohol level:  Lab Results  Component Value Date   ETH <10 07/09/2022   ETH 31 (H) 10/30/2021    Metabolic Disorder Labs: Lab Results  Component Value Date   HGBA1C 5.4 07/21/2020   MPG 108 07/21/2020   MPG 111.15 06/05/2020   No results found for: "PROLACTIN" Lab Results  Component Value Date   CHOL 218 (H) 01/02/2018   TRIG 187 (H) 01/02/2018   HDL 81 01/02/2018   CHOLHDL 2.7 01/02/2018   VLDL 37 01/02/2018   LDLCALC 100 (H) 01/02/2018   LDLCALC 82 01/12/2016    Physical Findings: AIMS:  , ,  ,  ,    CIWA:    COWS:     Musculoskeletal: Strength & Muscle Tone: within normal limits Gait & Station: normal Patient leans: N/A  Psychiatric Specialty Exam:  Presentation  General Appearance: Bizarre  Eye Contact:Minimal  Speech:Clear and Coherent  Speech Volume:Increased  Handedness:Right   Mood and Affect  Mood:Angry; Anxious; Dysphoric; Irritable; Labile  Affect:Depressed; Full Range; Tearful   Thought Process  Thought Processes:Coherent  Descriptions of Associations:Intact  Orientation:Full (Time, Place and Person)  Thought Content:Paranoid Ideation  History of Schizophrenia/Schizoaffective disorder:No  Duration of Psychotic Symptoms:No data recorded Hallucinations:No  data recorded Ideas of Reference:None  Suicidal Thoughts:No data recorded Homicidal Thoughts:No data recorded  Sensorium  Memory:Remote Fair; Immediate Fair  Judgment:Poor  Insight:Poor   Executive Functions  Concentration:Poor  Attention Span:Poor  Recall:Poor  Fund of Knowledge:Poor  Language:Fair   Psychomotor Activity  Psychomotor Activity:No data recorded  Assets  Assets:Communication Skills; Desire for Improvement; Housing   Sleep  Sleep:No data recorded  Physical Exam: Physical Exam Vitals and nursing note reviewed.  Constitutional:      Appearance: Normal appearance.  HENT:     Head: Normocephalic and atraumatic.     Mouth/Throat:     Pharynx: Oropharynx is clear.  Eyes:     Pupils: Pupils are equal, round, and reactive to light.  Cardiovascular:     Rate and Rhythm: Normal rate and regular rhythm.  Pulmonary:     Effort: Pulmonary effort is normal.     Breath sounds: Normal breath sounds.  Abdominal:     General: Abdomen is flat.     Palpations: Abdomen is soft.  Musculoskeletal:        General: Normal range of motion.  Skin:    General: Skin is warm and dry.  Neurological:     General: No focal deficit present.     Mental Status: She is alert. Mental status is at baseline.  Psychiatric:        Attention and Perception: She is inattentive.        Mood and Affect: Mood normal. Affect is labile.        Speech: Speech normal.        Behavior: Behavior is agitated. Behavior is not aggressive.        Thought Content: Thought content normal.        Judgment: Judgment is impulsive.    Review of Systems  Constitutional: Negative.   HENT: Negative.    Eyes: Negative.   Respiratory: Negative.    Cardiovascular: Negative.   Gastrointestinal: Negative.   Musculoskeletal: Negative.   Skin: Negative.   Neurological: Negative.   Psychiatric/Behavioral:  Positive for depression. Negative for hallucinations, substance abuse and suicidal  ideas. The patient is not nervous/anxious.    Blood pressure (!) 151/94, pulse 60, temperature 98 F (36.7 C), temperature source Oral, resp. rate 17, height 4\' 11"  (1.499 m), weight 72.6 kg, last menstrual period 05/28/2022, SpO2 99 %, not currently breastfeeding. Body mass index is 32.32 kg/m.   Treatment Plan Summary: Medication management and Plan continue current medicine while just trying to get some rapport with her so that she can at least agree in theory on the idea that we are trying to help her.  No change to medicine for now.  Encourage group attendance.  07/28/2022, MD 07/12/2022, 2:03 PM
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
## 2022-07-12 NOTE — Progress Notes (Signed)
Patient has been active on the unit. Gets along well with the other female patients on the unit.  She reports depression rating 7/10 at this encounter and denies si/hi/avh/anxiety and pain. She is med compliant and received her QHS medication without incident. Will continue to offer support and encouragement. Q15 minute safety checks in place.    C Butler-Nicholson, LPN

## 2022-07-12 NOTE — [facility] Suicide Risk Assessment (Signed)
[facility] INPATIENT:  Family/Significant Other Suicide Prevention Education  Suicide Prevention Education:  Patient Refusal for Family/Significant Other Suicide Prevention Education: The patient [name] has refused to provide written consent for family/significant other to be provided Family/Significant Other Suicide Prevention Education during admission and/or prior to discharge.  Physician notified.  SPE completed with pt, as pt refused to consent to family contact. SPI pamphlet provided to pt and pt was encouraged to share information with support network, ask questions, and talk about any concerns relating to SPE. Pt denies access to guns/firearms and verbalized understanding of information provided. Mobile Crisis information also provided to pt.   [name] 07/12/2022, 12:35 PM

## 2022-07-12 NOTE — Progress Notes (Signed)
Patient at the nurses station.  Requesting that the head of her bed be moved up.  Bed changed to her liking. Reports that the Seroquel that she had just taken is making her feel queasy and foggy headed, does not like how it is making her feel. Will continue to monitor. Advised to make staff aware if condition changes.   C Butler-Nicholson, LPN

## 2022-07-12 NOTE — Progress Notes (Signed)
Recreation Therapy Notes  Date: 07/12/2022  Time: 10:45 am    Location: Craft room   Behavioral response: N/A   Intervention Topic: Life Planning   Discussion/Intervention: Patient refused to attend group.   Clinical Observations/Feedback:  Patient refused to attend group.    Lacrecia Delval LRT/CTRS         Addasyn Mcbreen 07/12/2022 12:05 PM 

## 2022-07-12 NOTE — Plan of Care (Signed)
Patient pleasant and cooperative most of the day except for the treatment team. Patient stated in bed most of the day states " its so good to sleep." Patient states " I am depressed because they took my kids." Denies SI,HI and AVH. Compliant with medications. Appetite and energy level good. Support and encouragement given.

## 2022-07-12 NOTE — Plan of Care (Signed)
  Problem: Education: Goal: Knowledge of General Education information will improve Description: Including pain rating scale, medication(s)/side effects and non-pharmacologic comfort measures Outcome: Progressing   Problem: Safety: Goal: Ability to remain free from injury will improve Outcome: Progressing   Problem: Coping: Goal: Coping ability will improve Outcome: Progressing   Problem: Medication: Goal: Compliance with prescribed medication regimen will improve Outcome: Progressing   Problem: Self-Concept: Goal: Ability to disclose and discuss suicidal ideas will improve Outcome: Progressing

## 2022-07-12 NOTE — Plan of Care (Signed)
  Problem: Education: Goal: Knowledge of General Education information will improve Description: Including pain rating scale, medication(s)/side effects and non-pharmacologic comfort measures Outcome: Not Progressing   Problem: Coping: Goal: Coping ability will improve Outcome: Not Progressing   Problem: Medication: Goal: Compliance with prescribed medication regimen will improve Outcome: Not Progressing   Problem: Self-Concept: Goal: Ability to disclose and discuss suicidal ideas will improve Outcome: Not Progressing

## 2022-07-12 NOTE — [facility] Counselor (Signed)
Pt declining aftercare referral at this time.  Penni Homans, MSW, LCSW 07/12/2022 12:36 PM

## 2022-07-12 NOTE — Group Note (Signed)
[facility] LCSW Group Therapy Note   Group Date: 07/12/2022 Start Time: 1300 End Time: 1400   Type of Therapy/Topic:  Group Therapy:  Emotion Regulation  Participation Level:  Did Not Attend   Mood:  Description of Group:    The purpose of this group is to assist patients in learning to regulate negative emotions and experience positive emotions. Patients will be guided to discuss ways in which they have been vulnerable to their negative emotions. These vulnerabilities will be juxtaposed with experiences of positive emotions or situations, and patients challenged to use positive emotions to combat negative ones. Special emphasis will be placed on coping with negative emotions in conflict situations, and patients will process healthy conflict resolution skills.  Therapeutic Goals: Patient will identify two positive emotions or experiences to reflect on in order to balance out negative emotions:  Patient will label two or more emotions that they find the most difficult to experience:  Patient will be able to demonstrate positive conflict resolution skills through discussion or role plays:   Summary of Patient Progress:   X    Therapeutic Modalities:   Cognitive Behavioral Therapy Feelings Identification Dialectical Behavioral Therapy   [name] J Disha Cottam, LCSW 

## 2022-07-13 DIAGNOSIS — F3164 Bipolar disorder, current episode mixed, severe, with psychotic features: Secondary | ICD-10-CM | POA: Diagnosis not present

## 2022-07-13 MED ORDER — WITCH HAZEL-GLYCERIN EX PADS
MEDICATED_PAD | CUTANEOUS | Status: DC | PRN
  Administered 2022-07-13: 1 via TOPICAL
  Filled 2022-07-13: qty 100

## 2022-07-13 MED ORDER — TRAZODONE HCL 100 MG PO TABS
100.0000 mg | ORAL_TABLET | Freq: Every evening | ORAL | Status: DC | PRN
  Administered 2022-07-13: 100 mg via ORAL
  Filled 2022-07-13: qty 1

## 2022-07-13 MED ORDER — ONDANSETRON 4 MG PO TBDP: 4.0000 mg | ORAL_TABLET | Freq: Three times a day (TID) | ORAL | Status: DC | PRN

## 2022-07-13 MED ORDER — HYDROCORTISONE 1 % EX CREA
TOPICAL_CREAM | Freq: Four times a day (QID) | CUTANEOUS | Status: DC | PRN
  Administered 2022-07-13: 1 via TOPICAL
  Filled 2022-07-13: qty 28

## 2022-07-13 NOTE — Plan of Care (Signed)
  Problem: Education: Goal: Knowledge of General Education information will improve Description: Including pain rating scale, medication(s)/side effects and non-pharmacologic comfort measures Outcome: Progressing   Problem: Safety: Goal: Ability to remain free from injury will improve Outcome: Progressing   Problem: Coping: Goal: Coping ability will improve Outcome: Progressing   Problem: Medication: Goal: Compliance with prescribed medication regimen will improve Outcome: Progressing   Problem: Self-Concept: Goal: Ability to disclose and discuss suicidal ideas will improve Outcome: Progressing   

## 2022-07-13 NOTE — Group Note (Signed)
[facility] LCSW Group Therapy Note   Group Date: 07/13/2022 Start Time: 1300 End Time: 1400   Type of Therapy/Topic:  Group Therapy:  Balance in Life  Participation Level:  Active   Description of Group:    This group will address the concept of balance and how it feels and looks when one is unbalanced. Patients will be encouraged to process areas in their lives that are out of balance, and identify reasons for remaining unbalanced. Facilitators will guide patients utilizing problem- solving interventions to address and correct the stressor making their life unbalanced. Understanding and applying boundaries will be explored and addressed for obtaining  and maintaining a balanced life. Patients will be encouraged to explore ways to assertively make their unbalanced needs known to significant others in their lives, using other group members and facilitator for support and feedback.  Therapeutic Goals: Patient will identify two or more emotions or situations they have that consume much of in their lives. Patient will identify signs/triggers that life has become out of balance:  Patient will identify two ways to set boundaries in order to achieve balance in their lives:  Patient will demonstrate ability to communicate their needs through discussion and/or role plays  Summary of Patient Progress: Patient was present for the entirety of the group. She shared that strong feelings are what cause her to become off balance. Pt shares experience with a group of people who she thought of as friends but stated that every time that she hangs out with them they end up upset with her. She stated that she will often walk away or go do something else in order to shift her focus from that emotion. However, pt was also able to speak to setting limitations with family as she discussed her father upsetting her by not taking accountability for his own decisions, blaming her mother for issues, and stating that pt's dead child  was a consequence of her decision to have sex. Pt spoke about the importance of being able to talk about/process such things as being important for healing. Pt was open and receptive to comments and feedback from peers and facilitator.    Therapeutic Modalities:   Cognitive Behavioral Therapy Solution-Focused Therapy Assertiveness Training   Glenis Smoker, LCSW

## 2022-07-13 NOTE — Progress Notes (Signed)
Pleasant and cooperative this evening. Active on the unit, hanging out in the dayroom with her peers.

## 2022-07-13 NOTE — Progress Notes (Signed)
Recreation Therapy Notes  INPATIENT RECREATION THERAPY ASSESSMENT  Patient Details Name: [name] MRN: [id number] DOB: 11-08-1986 Today's Date: 07/13/2022       Information Obtained From: Patient  Able to Participate in Assessment/Interview: Yes  Patient Presentation: Responsive  Reason for Admission (Per Patient): Med Non-Compliance, Active Symptoms  Patient Stressors:    Coping Skills:   Talk, Exercise  Leisure Interests (2+):  Exercise - Walking, Music - Listen, Insurance account manager, Art - Optometrist of Recreation/Participation: Weekly  Awareness of Community Resources:  No  Community Resources:     Current Use:    If no, Barriers?:    Expressed Interest in [employer] Information: Yes  [employer] of Residence:  Film/video editor  Patient Main Form of Transportation: Therapist, music  Patient Strengths:  N/A  Patient Identified Areas of Improvement:  N/A  Patient Goal for Hospitalization:  Get back on meds  Current SI (including self-harm):  No  Current HI:  No  Current AVH: No  Staff Intervention Plan: Group Attendance, Collaborate with Interdisciplinary Treatment Team  Consent to Intern Participation: N/A  Asmar Brozek 07/13/2022, 3:31 PM

## 2022-07-13 NOTE — Plan of Care (Addendum)
Pt A+Ox3, cooperative with assessment. Pt reports not sleeping well and last night her stomach was upset due to the medicine. She endorses depression 5/10, denies SI/HI, denies a/v/h. Complaint with morning medication. Encouraged her to attend group, support given, safety checks maintained. 1522- Pt reports that she has a hemorrhoid, cream was ordered by Provider and given to pt, pt had took a shower, instructed to keep clean first and apply cream.   Problem: Education: Goal: Knowledge of General Education information will improve Description: Including pain rating scale, medication(s)/side effects and non-pharmacologic comfort measures Outcome: Progressing   Problem: Safety: Goal: Ability to remain free from injury will improve Outcome: Progressing   Problem: Coping: Goal: Coping ability will improve Outcome: Progressing   Problem: Medication: Goal: Compliance with prescribed medication regimen will improve Outcome: Progressing

## 2022-07-13 NOTE — Progress Notes (Signed)
Recreation Therapy Notes    Date: 07/13/2022  Time: 10:40 am    Location: Craft room   Behavioral response: N/A   Intervention Topic: Decision Making   Discussion/Intervention: Patient refused to attend group.   Clinical Observations/Feedback:  Patient refused to attend group.    Janete Quilling LRT/CTRS         [name] 07/13/2022 12:29 PM 

## 2022-07-13 NOTE — Progress Notes (Signed)
[facility] MD Progress Note  07/13/2022 12:39 PM [name]  MRN:  [id number] Subjective: Follow-up for this patient with chronic mood instability behavior problems and substance abuse.  Patient was in bed today.  Reported that she is feeling bad and some vague ways.  Denies any suicidal intent.  Denies hallucinations.  She did have nausea and threw up at least once last night. Principal Problem: Bipolar disorder, curr episode mixed, severe, with psychotic features (HCC) Diagnosis: Principal Problem:   Bipolar disorder, curr episode mixed, severe, with psychotic features (HCC) Active Problems:   Stimulant use disorder (HCC) (cocaine)   Cannabis use disorder, moderate, dependence (HCC)   Thoughts of self harm  Total Time spent with patient: 30 minutes  Past Psychiatric History: Past history of mood symptoms instability and intermittent possible psychosis behavior problems substance abuse  Past Medical History:  Past Medical History:  Diagnosis Date   Anxiety    Depression    Gestational diabetes    History of substance abuse (HCC)    History of suicide attempt    History of thyroid disease    Hypertension    Thyroid disease     Past Surgical History:  Procedure Laterality Date   CESAREAN SECTION  2000, 2002, 2013   WISDOM TOOTH EXTRACTION     Family History:  Family History  Problem Relation Age of Onset   Diabetes Mother    Family Psychiatric  History: See previous Social History:  Social History   Substance and Sexual Activity  Alcohol Use Not Currently   Comment: social drinker     Social History   Substance and Sexual Activity  Drug Use Not Currently   Types: Cocaine, Marijuana   Comment: Positive UDS 12/2017, last used mj 3 days ago , last used cocaine  a couple of months ago     Social History   Socioeconomic History   Marital status: Single    Spouse name: Not on file   Number of children: Not on file   Years of education: Not on file   Highest  education level: Not on file  Occupational History   Not on file  Tobacco Use   Smoking status: Former    Packs/day: 0.25    Types: Cigarettes   Smokeless tobacco: Never  Vaping Use   Vaping Use: Never used  Substance and Sexual Activity   Alcohol use: Not Currently    Comment: social drinker   Drug use: Not Currently    Types: Cocaine, Marijuana    Comment: Positive UDS 12/2017, last used mj 3 days ago , last used cocaine  a couple of months ago    Sexual activity: Yes    Birth control/protection: None  Other Topics Concern   Not on file  Social History Narrative   Not on file   Social Determinants of Health   Financial Resource Strain: Not on file  Food Insecurity: Not on file  Transportation Needs: Not on file  Physical Activity: Not on file  Stress: Not on file  Social Connections: Not on file   Additional Social History:                         Sleep: Fair  Appetite:  Poor  Current Medications: Current Facility-Administered Medications  Medication Dose Route Frequency Provider Last Rate Last Admin   acetaminophen (TYLENOL) tablet 650 mg  650 mg Oral Q4H PRN Gabriel Cirri F, NP   650 mg at  07/11/22 1446   alum & mag hydroxide-simeth (MAALOX/MYLANTA) 200-200-20 MG/5ML suspension 30 mL  30 mL Oral Q6H PRN Gabriel Cirri F, NP       diphenhydrAMINE (BENADRYL) capsule 50 mg  50 mg Oral Q6H PRN Darbie Biancardi T, MD   50 mg at 07/11/22 1600   Or   diphenhydrAMINE (BENADRYL) injection 50 mg  50 mg Intramuscular Q6H PRN Jyra Lagares T, MD       haloperidol (HALDOL) tablet 5 mg  5 mg Oral Q6H PRN Vandy Tsuchiya T, MD   5 mg at 07/11/22 1600   Or   haloperidol lactate (HALDOL) injection 5 mg  5 mg Intramuscular Q6H PRN Gema Ringold, Jackquline Denmark, MD       hydrOXYzine (ATARAX) tablet 25 mg  25 mg Oral TID PRN Vanetta Mulders, NP   25 mg at 07/12/22 0806   magnesium hydroxide (MILK OF MAGNESIA) suspension 30 mL  30 mL Oral Daily PRN Gabriel Cirri F, NP        ondansetron (ZOFRAN-ODT) disintegrating tablet 4 mg  4 mg Oral Q8H PRN Tameeka Luo T, MD       sertraline (ZOLOFT) tablet 50 mg  50 mg Oral Daily Laguana Desautel T, MD   50 mg at 07/13/22 0820   traZODone (DESYREL) tablet 100 mg  100 mg Oral QHS PRN Merin Borjon, Jackquline Denmark, MD        Lab Results: No results found for this or any previous visit (from the past 48 hour(s)).  Blood Alcohol level:  Lab Results  Component Value Date   ETH <10 07/09/2022   ETH 31 (H) 10/30/2021    Metabolic Disorder Labs: Lab Results  Component Value Date   HGBA1C 5.4 07/21/2020   MPG 108 07/21/2020   MPG 111.15 06/05/2020   No results found for: "PROLACTIN" Lab Results  Component Value Date   CHOL 218 (H) 01/02/2018   TRIG 187 (H) 01/02/2018   HDL 81 01/02/2018   CHOLHDL 2.7 01/02/2018   VLDL 37 01/02/2018   LDLCALC 100 (H) 01/02/2018   LDLCALC 82 01/12/2016    Physical Findings: AIMS:  , ,  ,  ,    CIWA:    COWS:     Musculoskeletal: Strength & Muscle Tone: within normal limits Gait & Station: normal Patient leans: N/A  Psychiatric Specialty Exam:  Presentation  General Appearance: Bizarre  Eye Contact:Minimal  Speech:Clear and Coherent  Speech Volume:Increased  Handedness:Right   Mood and Affect  Mood:Angry; Anxious; Dysphoric; Irritable; Labile  Affect:Depressed; Full Range; Tearful   Thought Process  Thought Processes:Coherent  Descriptions of Associations:Intact  Orientation:Full (Time, Place and Person)  Thought Content:Paranoid Ideation  History of Schizophrenia/Schizoaffective disorder:No  Duration of Psychotic Symptoms:No data recorded Hallucinations:No data recorded Ideas of Reference:None  Suicidal Thoughts:No data recorded Homicidal Thoughts:No data recorded  Sensorium  Memory:Remote Fair; Immediate Fair  Judgment:Poor  Insight:Poor   Executive Functions  Concentration:Poor  Attention Span:Poor  Recall:Poor  Fund of  Knowledge:Poor  Language:Fair   Psychomotor Activity  Psychomotor Activity:No data recorded  Assets  Assets:Communication Skills; Desire for Improvement; Housing   Sleep  Sleep:No data recorded   Physical Exam: Physical Exam Vitals and nursing note reviewed.  Constitutional:      Appearance: Normal appearance.  HENT:     Head: Normocephalic and atraumatic.     Mouth/Throat:     Pharynx: Oropharynx is clear.  Eyes:     Pupils: Pupils are equal, round, and reactive to light.  Cardiovascular:  Rate and Rhythm: Normal rate and regular rhythm.  Pulmonary:     Effort: Pulmonary effort is normal.     Breath sounds: Normal breath sounds.  Abdominal:     General: Abdomen is flat.     Palpations: Abdomen is soft.  Musculoskeletal:        General: Normal range of motion.  Skin:    General: Skin is warm and dry.  Neurological:     General: No focal deficit present.     Mental Status: She is alert. Mental status is at baseline.  Psychiatric:        Attention and Perception: She is inattentive. She does not perceive auditory hallucinations.        Mood and Affect: Affect is blunt and tearful.        Speech: Speech is delayed.        Behavior: Behavior is slowed.        Thought Content: Thought content normal.        Cognition and Memory: Cognition is impaired.    Review of Systems  Constitutional: Negative.   HENT: Negative.    Eyes: Negative.   Respiratory: Negative.    Cardiovascular: Negative.   Gastrointestinal:  Positive for nausea and vomiting.  Musculoskeletal: Negative.   Skin: Negative.   Neurological: Negative.   Psychiatric/Behavioral:  Positive for depression. Negative for hallucinations, substance abuse and suicidal ideas. The patient is nervous/anxious.    Blood pressure (!) 158/91, pulse 75, temperature 98.7 F (37.1 C), temperature source Oral, resp. rate 18, height 4\' 11"  (1.499 m), weight 72.6 kg, last menstrual period 05/28/2022, SpO2 100 %, not  currently breastfeeding. Body mass index is 32.32 kg/m.   Treatment Plan Summary: Medication management and Plan added ondansetron as needed.  Patient specifically felt she associated Seroquel with nausea so that will be discontinued and replaced with trazodone as needed for sleep.  Encourage group attendance and communication with staff.  07/28/2022, MD 07/13/2022, 12:39 PM

## 2022-07-13 NOTE — Progress Notes (Signed)
Patient came up to ask this writer if I would take a look at her hemorrhoids. This Clinical research associate, along with Almedia Balls, RN went into the exam room to take a look. Patient does have a protruding hemorrhoid, however, it was further explained to her that she has PRN cream and witch hazel pads to help treat it. Patient verbalized understanding and thanked this Clinical research associate and Almedia Balls, Charity fundraiser and went back down to the dayroom.

## 2022-07-13 NOTE — [facility] Group Notes (Signed)
[facility] Group Notes:  (Nursing/MHT/Case Management/Adjunct)  Date:  07/13/2022  Time:  10:14 AM  Type of Therapy:   Community Meeting  Participation Level:  Did Not Attend   Lynelle Smoke [facility] 07/13/2022, 10:14 AM

## 2022-07-13 NOTE — Progress Notes (Signed)
Recreation Therapy Notes  INPATIENT RECREATION TR PLAN  Patient Details Name: [name] MRN: [id number] DOB: 10/11/1986 Today's Date: 07/13/2022  Rec Therapy Plan Is patient appropriate for Therapeutic Recreation?: Yes Treatment times per week: at least 3 Estimated Length of Stay: 5-7 days TR Treatment/Interventions: Group participation (Comment)  Discharge Criteria Pt will be discharged from therapy if:: Discharged Treatment plan/goals/alternatives discussed and agreed upon by:: Patient/family  Discharge Summary     Wilmer Santillo 07/13/2022, 3:32 PM

## 2022-07-14 DIAGNOSIS — F3164 Bipolar disorder, current episode mixed, severe, with psychotic features: Secondary | ICD-10-CM | POA: Diagnosis not present

## 2022-07-14 MED ORDER — SERTRALINE HCL 50 MG PO TABS: 50.0000 mg | ORAL_TABLET | Freq: Every day | ORAL | 1 refills | Status: DC

## 2022-07-14 MED ORDER — TRAZODONE HCL 100 MG PO TABS: 100.0000 mg | ORAL_TABLET | Freq: Every evening | ORAL | 1 refills | Status: DC | PRN

## 2022-07-14 MED ORDER — HYDROXYZINE HCL 25 MG PO TABS: 25.0000 mg | ORAL_TABLET | Freq: Three times a day (TID) | ORAL | 0 refills | Status: DC | PRN

## 2022-07-14 NOTE — Plan of Care (Signed)
  Problem: Group Participation Goal: STG - Patient will focus on task/topic with 2 prompts from staff within 5 recreation therapy group sessions Description: STG - Patient will focus on task/topic with 2 prompts from staff within 5 recreation therapy group sessions 07/14/2022 1216 by Ernest Haber, LRT Outcome: Not Applicable 04/07/2439 1027 by Ernest Haber, LRT Outcome: Not Met (add Reason) Note: Patient did not attend any groups.

## 2022-07-14 NOTE — Progress Notes (Signed)
  [facility] Adult Case Management Discharge Plan :  Will you be returning to the same living situation after discharge:  Yes,  pt reports that she is returning home. At discharge, do you have transportation home?: Yes,  CSW to assist with transportation needs. Do you have the ability to pay for your medications: Yes,  [employer]  Release of information consent forms completed and in the chart;  Patient's signature needed at discharge.  Patient to Follow up at:  Follow-up Information     [facility] Follow up.   Why: You've declined aftercare at this time.  Walk in information is as follows: Monday, Wednesday and Friday 8Am to 4:30PM Contact information: [address] [facility] [address] [phone]                 Next level of care provider has access to [facility] Link:no  Safety Planning and Suicide Prevention discussed: Yes,  SPE completed with the patient.     Has patient been referred to the Quitline?: Patient refused referral  Patient has been referred for addiction treatment: Pt. refused referral  [name], LCSW 07/14/2022, 10:55 AM

## 2022-07-14 NOTE — [facility] Suicide Risk Assessment (Signed)
[facility] Discharge Suicide Risk Assessment   Principal Problem: Bipolar disorder, curr episode mixed, severe, with psychotic features (HCC) Discharge Diagnoses: Principal Problem:   Bipolar disorder, curr episode mixed, severe, with psychotic features (HCC) Active Problems:   Stimulant use disorder (HCC) (cocaine)   Cannabis use disorder, moderate, dependence (HCC)   Thoughts of self harm   Total Time spent with patient: 30 minutes  Musculoskeletal: Strength & Muscle Tone: within normal limits Gait & Station: normal Patient leans: N/A  Psychiatric Specialty Exam  Presentation  General Appearance: Bizarre  Eye Contact:Minimal  Speech:Clear and Coherent  Speech Volume:Increased  Handedness:Right   Mood and Affect  Mood:Angry; Anxious; Dysphoric; Irritable; Labile  Duration of Depression Symptoms: Greater than two weeks  Affect:Depressed; Full Range; Tearful   Thought Process  Thought Processes:Coherent  Descriptions of Associations:Intact  Orientation:Full (Time, Place and Person)  Thought Content:Paranoid Ideation  History of Schizophrenia/Schizoaffective disorder:No  Duration of Psychotic Symptoms:No data recorded Hallucinations:No data recorded Ideas of Reference:None  Suicidal Thoughts:No data recorded Homicidal Thoughts:No data recorded  Sensorium  Memory:Remote Fair; Immediate Fair  Judgment:Poor  Insight:Poor   Executive Functions  Concentration:Poor  Attention Span:Poor  Recall:Poor  Fund of Knowledge:Poor  Language:Fair   Psychomotor Activity  Psychomotor Activity:No data recorded  Assets  Assets:Communication Skills; Desire for Improvement; Housing   Sleep  Sleep:No data recorded  Physical Exam: Physical Exam Vitals and nursing note reviewed.  Constitutional:      Appearance: Normal appearance.  HENT:     Head: Normocephalic and atraumatic.     Mouth/Throat:     Pharynx: Oropharynx is clear.  Eyes:     Pupils:  Pupils are equal, round, and reactive to light.  Cardiovascular:     Rate and Rhythm: Normal rate and regular rhythm.  Pulmonary:     Effort: Pulmonary effort is normal.     Breath sounds: Normal breath sounds.  Abdominal:     General: Abdomen is flat.     Palpations: Abdomen is soft.  Musculoskeletal:        General: Normal range of motion.  Skin:    General: Skin is warm and dry.  Neurological:     General: No focal deficit present.     Mental Status: She is alert. Mental status is at baseline.  Psychiatric:        Attention and Perception: Attention normal.        Mood and Affect: Mood normal.        Speech: Speech normal.        Behavior: Behavior normal.        Thought Content: Thought content normal.        Cognition and Memory: Cognition normal.        Judgment: Judgment normal.    Review of Systems  Constitutional: Negative.   HENT: Negative.    Eyes: Negative.   Respiratory: Negative.    Cardiovascular: Negative.   Gastrointestinal: Negative.   Musculoskeletal: Negative.   Skin: Negative.   Neurological: Negative.   Psychiatric/Behavioral: Negative.     Blood pressure 131/77, pulse 64, temperature 98.7 F (37.1 C), temperature source Oral, resp. rate 18, height 4\' 11"  (1.499 m), weight 72.6 kg, last menstrual period 05/28/2022, SpO2 99 %, not currently breastfeeding. Body mass index is 32.32 kg/m.  Mental Status Per Nursing Assessment::   On Admission:  Self-harm thoughts, Suicidal ideation indicated by patient  Demographic Factors:  Living alone  Loss Factors: Loss of significant relationship  Historical Factors: Impulsivity  Risk  Reduction Factors:   NA  Continued Clinical Symptoms:  Depression:   Impulsivity Alcohol/Substance Abuse/Dependencies  Cognitive Features That Contribute To Risk:  Thought constriction (tunnel vision)    Suicide Risk:  Minimal: No identifiable suicidal ideation.  Patients presenting with no risk factors but with  morbid ruminations; may be classified as minimal risk based on the severity of the depressive symptoms    Plan Of Care/Follow-up recommendations:  Patient is currently calm denying any suicidal thoughts and not showing evidence of psychosis.  Behavior appropriate.  Denies any dangerousness and does not appear to be an acute threat to self or others.  It has been recommended multiple times to the patient that she follow-up with outpatient treatment which she has repeatedly declined despite knowing the risks.  Mordecai Rasmussen, MD 07/14/2022, 10:21 AM

## 2022-07-14 NOTE — Plan of Care (Signed)
D: Pt alert and oriented. Pt rates anxiety 8/10. Pt denies experiencing any pain at this time. Pt denies experiencing any SI/HI, or AVH at this time.   A: Scheduled medications administered to pt, per MD orders. Support and encouragement provided. Frequent verbal contact made. Routine safety checks conducted q15 minutes.   R: No adverse drug reactions noted. Pt verbally contracts for safety at this time. Pt compliant with medications and treatment plan. Pt interacts well with others on the unit. Pt remains safe at this time. Will continue to monitor.   Problem: Education: Goal: Knowledge of General Education information will improve Description: Including pain rating scale, medication(s)/side effects and non-pharmacologic comfort measures Outcome: Progressing   Problem: Medication: Goal: Compliance with prescribed medication regimen will improve Outcome: Progressing

## 2022-07-14 NOTE — Discharge Summary (Signed)
Physician Discharge Summary Note  Patient:  [name] is an 36 y.o., female MRN:  [id number] DOB:  26-Sep-1986 Patient phone:  [phone] (home)  Patient address:   [address] [place] [address],  Total Time spent with patient: 30 minutes  Date of Admission:  07/11/2022 Date of Discharge: 07/14/2022  Reason for Admission: Patient was admitted because of return of paranoid behavior and reported suicidal ideation as well as substance abuse  Principal Problem: Bipolar disorder, curr episode mixed, severe, with psychotic features (HCC) Discharge Diagnoses: Principal Problem:   Bipolar disorder, curr episode mixed, severe, with psychotic features (HCC) Active Problems:   Stimulant use disorder (HCC) (cocaine)   Cannabis use disorder, moderate, dependence (HCC)   Thoughts of self harm   Past Psychiatric History: Past history of recurrent mood instability behavior problems behavior instability substance abuse.  Has not been following up with outpatient treatment  Past Medical History:  Past Medical History:  Diagnosis Date   Anxiety    Depression    Gestational diabetes    History of substance abuse (HCC)    History of suicide attempt    History of thyroid disease    Hypertension    Thyroid disease     Past Surgical History:  Procedure Laterality Date   CESAREAN SECTION  2000, 2002, 2013   WISDOM TOOTH EXTRACTION     Family History:  Family History  Problem Relation Age of Onset   Diabetes Mother    Family Psychiatric  History: See previous Social History:  Social History   Substance and Sexual Activity  Alcohol Use Not Currently   Comment: social drinker     Social History   Substance and Sexual Activity  Drug Use Not Currently   Types: Cocaine, Marijuana   Comment: Positive UDS 12/2017, last used mj 3 days ago , last used cocaine  a couple of months ago     Social History   Socioeconomic History   Marital status: Single    Spouse  name: Not on file   Number of children: Not on file   Years of education: Not on file   Highest education level: Not on file  Occupational History   Not on file  Tobacco Use   Smoking status: Former    Packs/day: 0.25    Types: Cigarettes   Smokeless tobacco: Never  Vaping Use   Vaping Use: Never used  Substance and Sexual Activity   Alcohol use: Not Currently    Comment: social drinker   Drug use: Not Currently    Types: Cocaine, Marijuana    Comment: Positive UDS 12/2017, last used mj 3 days ago , last used cocaine  a couple of months ago    Sexual activity: Yes    Birth control/protection: None  Other Topics Concern   Not on file  Social History Narrative   Not on file   Social Determinants of Health   Financial Resource Strain: Not on file  Food Insecurity: Not on file  Transportation Needs: Not on file  Physical Activity: Not on file  Stress: Not on file  Social Connections: Not on file    Hospital Course: Admitted to the psychiatric hospital.  15-minute checks maintained.  Patient did not display any dangerous aggressive violent or suicidal behavior.  She was compliant with treatment.  Initially complained of fatigue and nausea but by the time of discharge had brightened up.  Currently not showing any signs of psychosis despite lack of  antipsychotic medication.  She has been recommended many times to follow-up with outpatient treatment which she has declined.  Strongly imperfect.  Patient to refrain from abuse of cocaine and other drugs which is probably the underlying biggest issue in addition to chronic dysthymia and personality issues.  At this point no longer appears to be an acute risk to self or others nor likely to benefit from inpatient treatment.  Physical Findings: AIMS:  , ,  ,  ,    CIWA:    COWS:     Musculoskeletal: Strength & Muscle Tone: within normal limits Gait & Station: normal Patient leans: N/A   Psychiatric Specialty Exam:  Presentation   General Appearance: Bizarre  Eye Contact:Minimal  Speech:Clear and Coherent  Speech Volume:Increased  Handedness:Right   Mood and Affect  Mood:Angry; Anxious; Dysphoric; Irritable; Labile  Affect:Depressed; Full Range; Tearful   Thought Process  Thought Processes:Coherent  Descriptions of Associations:Intact  Orientation:Full (Time, Place and Person)  Thought Content:Paranoid Ideation  History of Schizophrenia/Schizoaffective disorder:No  Duration of Psychotic Symptoms:No data recorded Hallucinations:No data recorded Ideas of Reference:None  Suicidal Thoughts:No data recorded Homicidal Thoughts:No data recorded  Sensorium  Memory:Remote Fair; Immediate Fair  Judgment:Poor  Insight:Poor   Executive Functions  Concentration:Poor  Attention Span:Poor  Recall:Poor  Fund of Knowledge:Poor  Language:Fair   Psychomotor Activity  Psychomotor Activity:No data recorded  Assets  Assets:Communication Skills; Desire for Improvement; Housing   Sleep  Sleep:No data recorded   Physical Exam: Physical Exam Vitals and nursing note reviewed.  Constitutional:      Appearance: Normal appearance.  HENT:     Head: Normocephalic and atraumatic.     Mouth/Throat:     Pharynx: Oropharynx is clear.  Eyes:     Pupils: Pupils are equal, round, and reactive to light.  Cardiovascular:     Rate and Rhythm: Normal rate and regular rhythm.  Pulmonary:     Effort: Pulmonary effort is normal.     Breath sounds: Normal breath sounds.  Abdominal:     General: Abdomen is flat.     Palpations: Abdomen is soft.  Musculoskeletal:        General: Normal range of motion.  Skin:    General: Skin is warm and dry.  Neurological:     General: No focal deficit present.     Mental Status: She is alert. Mental status is at baseline.  Psychiatric:        Attention and Perception: Attention normal.        Mood and Affect: Mood normal.        Speech: Speech normal.         Behavior: Behavior normal.        Thought Content: Thought content normal.        Cognition and Memory: Cognition normal.        Judgment: Judgment normal.    Review of Systems  Constitutional: Negative.   HENT: Negative.    Eyes: Negative.   Respiratory: Negative.    Cardiovascular: Negative.   Gastrointestinal: Negative.   Musculoskeletal: Negative.   Skin: Negative.   Neurological: Negative.   Psychiatric/Behavioral: Negative.     Blood pressure 131/77, pulse 64, temperature 98.7 F (37.1 C), temperature source Oral, resp. rate 18, height 4\' 11"  (1.499 m), weight 72.6 kg, last menstrual period 05/28/2022, SpO2 99 %, not currently breastfeeding. Body mass index is 32.32 kg/m.   Social History   Tobacco Use  Smoking Status Former   Packs/day: 0.25  Types: Cigarettes  Smokeless Tobacco Never   Tobacco Cessation:  A prescription for an FDA-approved tobacco cessation medication was offered at discharge and the patient refused   Blood Alcohol level:  Lab Results  Component Value Date   ETH <10 07/09/2022   ETH 31 (H) 10/30/2021    Metabolic Disorder Labs:  Lab Results  Component Value Date   HGBA1C 5.4 07/21/2020   MPG 108 07/21/2020   MPG 111.15 06/05/2020   No results found for: "PROLACTIN" Lab Results  Component Value Date   CHOL 218 (H) 01/02/2018   TRIG 187 (H) 01/02/2018   HDL 81 01/02/2018   CHOLHDL 2.7 01/02/2018   VLDL 37 01/02/2018   LDLCALC 100 (H) 01/02/2018   LDLCALC 82 01/12/2016    See Psychiatric Specialty Exam and Suicide Risk Assessment completed by Attending Physician prior to discharge.  Discharge destination:  Home  Is patient on multiple antipsychotic therapies at discharge:  No   Has Patient had three or more failed trials of antipsychotic monotherapy by history:  No  Recommended Plan for Multiple Antipsychotic Therapies: NA  Discharge Instructions     Diet - low sodium heart healthy   Complete by: As directed    Increase  activity slowly   Complete by: As directed       Allergies as of 07/14/2022       Reactions   Aspirin Anaphylaxis, Diarrhea, Nausea And Vomiting   Banana Anaphylaxis, Itching, Shortness Of Breath, Swelling   Other Itching, Shortness Of Breath, Swelling   Peanut Oil Itching, Shortness Of Breath, Swelling   Peanut-containing Drug Products Shortness Of Breath, Itching, Swelling   Pecan Nut (diagnostic) Anaphylaxis   Latex Itching        Medication List     STOP taking these medications    acetaminophen 325 MG tablet Commonly known as: TYLENOL   hydrochlorothiazide 25 MG tablet Commonly known as: HYDRODIURIL       TAKE these medications      Indication  hydrOXYzine 25 MG tablet Commonly known as: ATARAX Take 1 tablet (25 mg total) by mouth 3 (three) times daily as needed for anxiety.  Indication: Feeling Anxious   sertraline 50 MG tablet Commonly known as: ZOLOFT Take 1 tablet (50 mg total) by mouth daily. Start taking on: July 15, 2022  Indication: Major Depressive Disorder   traZODone 100 MG tablet Commonly known as: DESYREL Take 1 tablet (100 mg total) by mouth at bedtime as needed for sleep.  Indication: Trouble Sleeping         Follow-up recommendations: Recommended follow-up at [facility] but she declines  Comments: Prescriptions for current medicine supply  Signed: Mordecai Rasmussen, MD 07/14/2022, 10:27 AM

## 2022-07-14 NOTE — Progress Notes (Signed)
Recreation Therapy Notes  INPATIENT RECREATION TR PLAN  Patient Details Name: [name] MRN: [id number] DOB: [birth year] Today's Date: 07/14/2022  Rec Therapy Plan Is patient appropriate for Therapeutic Recreation?: Yes Treatment times per week: at least 3 Estimated Length of Stay: 5-7 days TR Treatment/Interventions: Group participation (Comment)  Discharge Criteria Pt will be discharged from therapy if:: Discharged Treatment plan/goals/alternatives discussed and agreed upon by:: Patient/family  Discharge Summary Short term goals set: Patient will focus on task/topic with 2 prompts from staff within 5 recreation therapy group sessions Short term goals met: Not met Reason goals not met: Patient did not attend any groups Therapeutic equipment acquired: N/A Reason patient discharged from therapy: Discharge from hospital Pt/family agrees with progress & goals achieved: Yes Date patient discharged from therapy: 07/14/22   Harutyun Monteverde 07/14/2022, 12:18 PM

## 2022-07-14 NOTE — Progress Notes (Signed)
D: Pt alert and oriented. Pt denies experiencing any pain, SI/HI, or AVH at this time. Pt reports she will be able to keep herself safe when she returns home.   A: Pt received discharge and medication education/information. Pt belongings were returned and signed for at this time to include discharge packet and printed prescription.   R: Pt verbalized understanding of discharge and medication education/information.  Pt escorted by staff to medical mall front lobby where pt was picked up by [facility] and transported home.

## 2022-07-14 NOTE — Progress Notes (Signed)
Recreation Therapy Notes  Date: 07/14/2022  Time: 10:10 am    Location: Craft room   Behavioral response: N/A   Intervention Topic: Self-care   Discussion/Intervention: Patient refused to attend group.   Clinical Observations/Feedback:  Patient refused to attend group.    Cristal Howatt LRT/CTRS        Oleda Borski 07/14/2022 12:13 PM

## 2022-07-14 NOTE — B[facility] Counselor (Signed)
Patient again declined referral for aftercare treatment.   Penni Homans, MSW, LCSW 07/14/2022 10:57 AM

## 2022-09-13 ENCOUNTER — Ambulatory Visit: Payer: Medicaid Other

## 2022-10-10 ENCOUNTER — Encounter: Payer: Self-pay | Admitting: Nurse Practitioner

## 2022-10-10 ENCOUNTER — Ambulatory Visit: Payer: Medicaid Other | Admitting: Nurse Practitioner

## 2022-10-10 DIAGNOSIS — Z3202 Encounter for pregnancy test, result negative: Secondary | ICD-10-CM

## 2022-10-10 DIAGNOSIS — Z113 Encounter for screening for infections with a predominantly sexual mode of transmission: Secondary | ICD-10-CM | POA: Diagnosis not present

## 2022-10-10 DIAGNOSIS — B009 Herpesviral infection, unspecified: Secondary | ICD-10-CM

## 2022-10-10 DIAGNOSIS — Z3200 Encounter for pregnancy test, result unknown: Secondary | ICD-10-CM

## 2022-10-10 DIAGNOSIS — A599 Trichomoniasis, unspecified: Secondary | ICD-10-CM

## 2022-10-10 LAB — HM HEPATITIS C SCREENING LAB: HM Hepatitis Screen: NEGATIVE

## 2022-10-10 LAB — WET PREP FOR TRICH, YEAST, CLUE
Trichomonas Exam: POSITIVE — AB
Yeast Exam: NEGATIVE

## 2022-10-10 LAB — HEPATITIS B SURFACE ANTIGEN

## 2022-10-10 LAB — HM HIV SCREENING LAB: HM HIV Screening: NEGATIVE

## 2022-10-10 LAB — PREGNANCY, URINE: Preg Test, Ur: NEGATIVE

## 2022-10-10 MED ORDER — ACYCLOVIR 800 MG PO TABS: 800.0000 mg | ORAL_TABLET | Freq: Two times a day (BID) | ORAL | 11 refills | Status: AC

## 2022-10-10 MED ORDER — METRONIDAZOLE 500 MG PO TABS: 500.0000 mg | ORAL_TABLET | Freq: Two times a day (BID) | ORAL | 0 refills | Status: AC

## 2022-10-10 NOTE — Progress Notes (Addendum)
[facility] Department  STI clinic/screening visit [place] [address] [phone]  Subjective:  [name] is a 36 y.o. female being seen today for an STI screening visit. The patient reports they do have symptoms.  Patient reports that they do not desire a pregnancy in the next year.   They reported they are not interested in discussing contraception today.    Patient's last menstrual period was 10/03/2022.   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   Supervision of high risk pregnancy, antepartum 07/23/2020   Substance abuse complicating pregnancy, antepartum ([place]) 07/23/2020   Bipolar disease in pregnancy, unspecified trimester ([place]) 07/23/2020   Genital herpes affecting pregnancy 07/23/2020   History of prior pregnancy with IUGR newborn 07/23/2020   Asthma affecting pregnancy, antepartum 07/23/2020   History of fetal anomaly in prior pregnancy, currently pregnant 07/23/2020   MDD (major depressive disorder), recurrent severe, without psychosis ([place]) 07/18/2020   Major depressive disorder, recurrent severe without psychotic features ([place]) 07/15/2020   Severe recurrent major depression without psychotic features ([place]) 06/03/2020   History of cesarean delivery 05/28/2020   Bipolar disorder, curr episode mixed, severe, with psychotic features ([place])    MDD (major depressive disorder), recurrent episode, severe ([place]) 04/08/2020   Thoughts of self harm 02/01/2020   Cocaine use disorder, moderate, dependence ([place]) 01/01/2018   Cannabis use disorder, moderate, dependence ([place]) 01/01/2018   Pregnant 12/31/2017   Medically noncompliant 12/31/2017   Tobacco use disorder 01/12/2016   Stimulant use disorder ([place]) (cocaine) 01/12/2016   Alcohol use disorder, moderate,  dependence ([place]) 42/68/3419   Self-inflicted laceration of wrist ([employer]) 01/11/2016   HSIL on Pap smear of cervix 05/17/2015    Chief Complaint  Patient presents with   SEXUALLY TRANSMITTED DISEASE    Screening patient states she is having vaginal odor and itching     HPI  Patient reports to clinic today for STD screening.  Patient reports genital itching and vaginal irritation that began yesterday, abnormal vaginal bleeding that began on 10/10.23, and lower abdominal pain on and off that began one month ago.    Does the patient using douching products? No  Last HIV test per patient/review of record was No results found for: "HMHIVSCREEN"  Lab Results  Component Value Date   HIV NON REACTIVE 05/19/2020   Patient reports last pap was: 11/2020   Screening for MPX risk: Does the patient have an unexplained rash? No Is the patient MSM? No Does the patient endorse multiple sex partners or anonymous sex partners? Yes Did the patient have close or sexual contact with a person diagnosed with MPX? No Has the patient traveled outside the [place] where MPX is endemic? No Is there a high clinical suspicion for MPX-- evidenced by one of the following No  -Unlikely to be chickenpox  -Lymphadenopathy  -Rash that present in same phase of evolution on any given body part See flowsheet for further details and programmatic requirements.   Immunization history:  Immunization History  Administered Date(s) Administered   Hep A / Hep B 08/08/2007   Hepatitis B, PED/ADOLESCENT 11/16/1997, 01/21/1998, 04/26/1998   Tdap 07/30/2015, 01/11/2016, 06/23/2020   Varicella 07/30/2015     The following portions of the patient's history were reviewed and updated as appropriate: allergies, current medications, past medical history, past social history,  past surgical history and problem list.  Objective:  There were no vitals filed for this visit.  Physical Exam Constitutional:      Appearance: Normal  appearance.  HENT:     Head: Normocephalic. No abrasion, masses or laceration. Hair is normal.     Right Ear: External ear normal.     Left Ear: External ear normal.     Nose: Nose normal.     Mouth/Throat:     Lips: Pink.     Mouth: Mucous membranes are moist. No oral lesions.     Pharynx: No oropharyngeal exudate or posterior oropharyngeal erythema.     Tonsils: No tonsillar exudate or tonsillar abscesses.  Eyes:     General: Lids are normal.        Right eye: No discharge.        Left eye: No discharge.     Conjunctiva/sclera: Conjunctivae normal.     Right eye: No exudate.    Left eye: No exudate. Abdominal:     General: Abdomen is flat.     Palpations: Abdomen is soft.     Tenderness: There is no abdominal tenderness. There is no rebound.  Genitourinary:    Pubic Area: No rash or pubic lice.      Labia:        Right: No rash, tenderness, lesion or injury.        Left: No rash, tenderness, lesion or injury.      Vagina: Normal. No vaginal discharge, erythema or lesions.     Cervix: No cervical motion tenderness, discharge, lesion or erythema.     Uterus: Not enlarged and not tender.      Rectum: Normal.     Comments: Amount Discharge: small  Odor: Yes pH: greater than 4.5 Adheres to vaginal wall: No Color: brownish  Musculoskeletal:     Cervical back: Full passive range of motion without pain, normal range of motion and neck supple.  Lymphadenopathy:     Cervical: No cervical adenopathy.     Right cervical: No superficial, deep or posterior cervical adenopathy.    Left cervical: No superficial, deep or posterior cervical adenopathy.     Upper Body:     Right upper body: No supraclavicular, axillary or epitrochlear adenopathy.     Left upper body: No supraclavicular, axillary or epitrochlear adenopathy.     Lower Body: No right inguinal adenopathy. No left inguinal adenopathy.  Skin:    General: Skin is warm and dry.     Findings: No lesion or rash.  Neurological:      Mental Status: She is alert and oriented to person, place, and time.  Psychiatric:        Attention and Perception: Attention normal.        Mood and Affect: Mood normal.        Speech: Speech normal.        Behavior: Behavior normal. Behavior is cooperative.      Assessment and Plan:  [name] is a 36 y.o. female presenting to the [facility] Department for STI screening  1. Screening examination for venereal disease -36 year old female in clinic today for STD screening. -Patient accepted all screenings including oral, vaginal CT/GC, wet prep and bloodwork for HIV/RPR.  Patient meets criteria for HepB screening? Yes. Ordered? Yes Patient meets criteria for HepC screening? Yes. Ordered? Yes  Treat wet prep per standing order Discussed time line for State Lab results and that patient will be  called with positive results and encouraged patient to call if she had not heard in 2 weeks.  Counseled to return or seek care for continued or worsening symptoms Recommended condom use with all sex  Patient is currently using not   contraception  to prevent pregnancy.    - HIV/HCV [place] [facility] - Syphilis Serology, [place] [facility] - Chlamydia/Gonorrhea [place] [facility] - HBV Antigen/Antibody State [facility] - [place], [place], [facility]    2. Pregnancy examination or test, pregnancy unconfirmed -Patient desires PT today.  PT negative.  - Pregnancy, urine  3. Trichimoniasis -Treat patient today for Trich.   - metroNIDAZOLE (FLAGYL) 500 MG tablet; Take 1 tablet (500 mg total) by mouth 2 (two) times daily for 7 days.  Dispense: 14 tablet; Refill: 0  4. HSV infection -Prescription given for episodic treatment for HSV.   - acyclovir (ZOVIRAX) 800 MG tablet; Take 1 tablet (800 mg total) by mouth 2 (two) times daily.  Dispense: 10 tablet; Refill: 11   Total time spent: 30 minutes    Return if symptoms worsen or fail to improve.  Future  Appointments  Date Time Provider [place]  10/10/2022  2:50 PM Gregary Cromer, FNP [facility] None    Gregary Cromer, FNP

## 2022-10-10 NOTE — Progress Notes (Signed)
Pt here for STI screening.  Wet mount results reviewed with patient.  Pt informed of negative pregnancy test.  Patient positive for Trich.  Metronidazole 500mg  #14 1 po BID dispensed.  Pt counseled on medication, side effects, plan of care and when to contact clinic for questions or concerns.  Pt verbalizes understanding of above and voices no questions at this time.  Condoms provided.-Forest Becker, RN

## 2023-04-20 ENCOUNTER — Emergency Department
Admission: EM | Admit: 2023-04-20 | Discharge: 2023-04-20 | Disposition: A | Discharge status: Home / Self Care | Payer: 59 | Attending: Emergency Medicine | Admitting: Emergency Medicine

## 2023-04-20 ENCOUNTER — Emergency Department: Payer: 59

## 2023-04-20 ENCOUNTER — Other Ambulatory Visit: Payer: Self-pay

## 2023-04-20 DIAGNOSIS — R0602 Shortness of breath: Secondary | ICD-10-CM | POA: Diagnosis present

## 2023-04-20 DIAGNOSIS — J9801 Acute bronchospasm: Secondary | ICD-10-CM | POA: Diagnosis not present

## 2023-04-20 LAB — CBC
HCT: 41 % (ref 36.0–46.0)
Hemoglobin: 13.2 g/dL (ref 12.0–15.0)
MCH: 29 pg (ref 26.0–34.0)
MCHC: 32.2 g/dL (ref 30.0–36.0)
MCV: 90.1 fL (ref 80.0–100.0)
Platelets: 156 10*3/uL (ref 150–400)
RBC: 4.55 MIL/uL (ref 3.87–5.11)
RDW: 13.2 % (ref 11.5–15.5)
WBC: 4.7 10*3/uL (ref 4.0–10.5)
nRBC: 0 % (ref 0.0–0.2)

## 2023-04-20 LAB — BASIC METABOLIC PANEL
Anion gap: 10 (ref 5–15)
BUN: 7 mg/dL (ref 6–20)
CO2: 19 mmol/L — ABNORMAL LOW (ref 22–32)
Calcium: 8.5 mg/dL — ABNORMAL LOW (ref 8.9–10.3)
Chloride: 106 mmol/L (ref 98–111)
Creatinine, Ser: 0.63 mg/dL (ref 0.44–1.00)
GFR, Estimated: >60 mL/min (ref >60)
Glucose, Bld: 103 mg/dL — ABNORMAL HIGH (ref 70–99)
Potassium: 3.4 mmol/L — ABNORMAL LOW (ref 3.5–5.1)
Sodium: 135 mmol/L (ref 135–145)

## 2023-04-20 MED ORDER — PREDNISONE 50 MG PO TABS: 50.0000 mg | ORAL_TABLET | Freq: Every day | ORAL | 0 refills | Status: DC

## 2023-04-20 MED ORDER — ACETAMINOPHEN 325 MG PO TABS
650.0000 mg | ORAL_TABLET | Freq: Once | ORAL | Status: AC
  Administered 2023-04-20: 650 mg via ORAL
  Filled 2023-04-20: qty 2

## 2023-04-20 MED ORDER — IPRATROPIUM-ALBUTEROL 0.5-2.5 (3) MG/3ML IN SOLN
3.0000 mL | Freq: Once | RESPIRATORY_TRACT | Status: AC
  Administered 2023-04-20: 3 mL via RESPIRATORY_TRACT
  Filled 2023-04-20: qty 3

## 2023-04-20 MED ORDER — ALBUTEROL SULFATE HFA 108 (90 BASE) MCG/ACT IN AERS: 2.0000 | INHALATION_SPRAY | Freq: Four times a day (QID) | RESPIRATORY_TRACT | 2 refills | Status: AC | PRN

## 2023-04-20 MED ORDER — PREDNISONE 20 MG PO TABS
60.0000 mg | ORAL_TABLET | Freq: Once | ORAL | Status: AC
  Administered 2023-04-20: 60 mg via ORAL
  Filled 2023-04-20: qty 3

## 2023-04-20 NOTE — ED Provider Notes (Signed)
[facility] Provider Note    Event Date/Time   First MD Initiated Contact with Patient 04/20/23 1252     (approximate)   History   Shortness of Breath   HPI  [name] Begin is a 37 y.o. female with a history of substance abuse, schizophrenia, cannabis use, tobacco use who presents with complaint of shortness of breath, was treated with nebulizers by EMS with significant improvement, she states that she feels somewhat tight when she breathes still.  No pain.  No fevers or chills     Physical Exam   Triage Vital Signs: ED Triage Vitals  Enc Vitals Group     BP 04/20/23 1208 (!) 145/93     Pulse Rate 04/20/23 1208 83     Resp 04/20/23 1208 18     Temp 04/20/23 1208 99.7 F (37.6 C)     Temp Source 04/20/23 1208 Oral     SpO2 04/20/23 1208 98 %     Weight --      Height --      Head Circumference --      Peak Flow --      Pain Score 04/20/23 1202 6     Pain Loc --      Pain Edu? --      Excl. in GC? --     Most recent vital signs: Vitals:   04/20/23 1208  BP: (!) 145/93  Pulse: 83  Resp: 18  Temp: 99.7 F (37.6 C)  SpO2: 98%     General: Awake, no distress.  CV:  Good peripheral perfusion.  Resp:  Normal effort.  Scattered mild wheezes Abd:  No distention.  Other:     ED Results / Procedures / Treatments   Labs (all labs ordered are listed, but only abnormal results are displayed) Labs Reviewed  BASIC METABOLIC PANEL - Abnormal; Notable for the following components:      Result Value   Potassium 3.4 (*)    CO2 19 (*)    Glucose, Bld 103 (*)    Calcium 8.5 (*)    All other components within normal limits  CBC  POC URINE PREG, ED     EKG  ED ECG REPORT I, Jene Every, the attending physician, personally viewed and interpreted this ECG.  Date: 04/20/2023  Rhythm: normal sinus rhythm QRS Axis: normal Intervals: normal ST/T Wave abnormalities: normal Narrative Interpretation: no evidence of acute  ischemia    RADIOLOGY X-ray viewed interpreted by me, no pneumonia   PROCEDURES:  Critical Care performed:   Procedures   MEDICATIONS ORDERED IN ED: Medications  ipratropium-albuterol (DUONEB) 0.5-2.5 (3) MG/3ML nebulizer solution 3 mL (3 mLs Nebulization Given 04/20/23 1337)  ipratropium-albuterol (DUONEB) 0.5-2.5 (3) MG/3ML nebulizer solution 3 mL (3 mLs Nebulization Given 04/20/23 1337)     IMPRESSION / MDM / ASSESSMENT AND PLAN / ED COURSE  I reviewed the triage vital signs and the nursing notes. Patient's presentation is most consistent with exacerbation of chronic illness.  Patient presents with shortness of breath as detailed above, overall well-appearing in no acute distress, scattered mild wheezes, suspect bronchospasm, versus COPD/asthma.  Doubt pneumonia, no fevers reported, x-ray pending  X-ray and labs are quite reassuring, after 2 DuoNeb's patient is feeling much better, lungs are clear to auscultation, appropriate for discharge with outpatient follow-up, will Rx steroids, Ventolin inhaler        FINAL CLINICAL IMPRESSION(S) / ED DIAGNOSES   Final diagnoses:  Bronchospasm  Rx / DC Orders   ED Discharge Orders          Ordered    albuterol (VENTOLIN HFA) 108 (90 Base) MCG/ACT inhaler  Every 6 hours PRN        04/20/23 1343    predniSONE (DELTASONE) 50 MG tablet  Daily with breakfast        04/20/23 1343             Note:  This document was prepared using Dragon voice recognition software and may include unintentional dictation errors.   Jene Every, MD 04/20/23 [phone]

## 2023-04-20 NOTE — ED Triage Notes (Signed)
Pt comes via EMs from home with c/o sob. Pt states no relief with inhaler. Pt given 2 albuterol with ems. Pt does have wheezing but 100 % O2.   Pt has not been taking Bp meds.

## 2023-05-07 ENCOUNTER — Inpatient Hospital Stay
Admission: RE | Admit: 2023-05-07 | Payer: No Typology Code available for payment source | Source: Intra-hospital | Admitting: Psychiatry

## 2023-05-07 ENCOUNTER — Emergency Department (EMERGENCY_DEPARTMENT_HOSPITAL)
Admission: EM | Admit: 2023-05-07 | Discharge: 2023-05-07 | Disposition: A | Discharge status: Other Acute Inpatient Hospital | Payer: No Typology Code available for payment source | Source: Home / Self Care | Attending: Emergency Medicine | Admitting: Emergency Medicine

## 2023-05-07 ENCOUNTER — Other Ambulatory Visit: Payer: Self-pay

## 2023-05-07 ENCOUNTER — Encounter: Payer: Self-pay | Admitting: Psychiatry

## 2023-05-07 ENCOUNTER — Inpatient Hospital Stay
Admission: AD | Admit: 2023-05-07 | Discharge: 2023-05-15 | DRG: 885 | Disposition: A | Discharge status: Home / Self Care | Payer: No Typology Code available for payment source | Source: Intra-hospital | Attending: Psychiatry | Admitting: Psychiatry

## 2023-05-07 DIAGNOSIS — F1910 Other psychoactive substance abuse, uncomplicated: Secondary | ICD-10-CM | POA: Insufficient documentation

## 2023-05-07 DIAGNOSIS — F1420 Cocaine dependence, uncomplicated: Secondary | ICD-10-CM | POA: Diagnosis present

## 2023-05-07 DIAGNOSIS — I10 Essential (primary) hypertension: Secondary | ICD-10-CM | POA: Insufficient documentation

## 2023-05-07 DIAGNOSIS — F1590 Other stimulant use, unspecified, uncomplicated: Secondary | ICD-10-CM | POA: Diagnosis present

## 2023-05-07 DIAGNOSIS — F1210 Cannabis abuse, uncomplicated: Secondary | ICD-10-CM | POA: Diagnosis present

## 2023-05-07 DIAGNOSIS — F10929 Alcohol use, unspecified with intoxication, unspecified: Secondary | ICD-10-CM

## 2023-05-07 DIAGNOSIS — F411 Generalized anxiety disorder: Secondary | ICD-10-CM | POA: Diagnosis present

## 2023-05-07 DIAGNOSIS — Z87891 Personal history of nicotine dependence: Secondary | ICD-10-CM | POA: Insufficient documentation

## 2023-05-07 DIAGNOSIS — Z9151 Personal history of suicidal behavior: Secondary | ICD-10-CM

## 2023-05-07 DIAGNOSIS — R45851 Suicidal ideations: Secondary | ICD-10-CM

## 2023-05-07 DIAGNOSIS — S71111D Laceration without foreign body, right thigh, subsequent encounter: Secondary | ICD-10-CM | POA: Insufficient documentation

## 2023-05-07 DIAGNOSIS — F10220 Alcohol dependence with intoxication, uncomplicated: Secondary | ICD-10-CM | POA: Diagnosis present

## 2023-05-07 DIAGNOSIS — Z91040 Latex allergy status: Secondary | ICD-10-CM

## 2023-05-07 DIAGNOSIS — Y906 Blood alcohol level of 120-199 mg/100 ml: Secondary | ICD-10-CM | POA: Insufficient documentation

## 2023-05-07 DIAGNOSIS — Z79899 Other long term (current) drug therapy: Secondary | ICD-10-CM | POA: Insufficient documentation

## 2023-05-07 DIAGNOSIS — Z91010 Allergy to peanuts: Secondary | ICD-10-CM | POA: Diagnosis not present

## 2023-05-07 DIAGNOSIS — F1499 Cocaine use, unspecified with unspecified cocaine-induced disorder: Secondary | ICD-10-CM | POA: Insufficient documentation

## 2023-05-07 DIAGNOSIS — F332 Major depressive disorder, recurrent severe without psychotic features: Secondary | ICD-10-CM | POA: Diagnosis present

## 2023-05-07 DIAGNOSIS — B009 Herpesviral infection, unspecified: Secondary | ICD-10-CM

## 2023-05-07 DIAGNOSIS — F1020 Alcohol dependence, uncomplicated: Secondary | ICD-10-CM | POA: Diagnosis present

## 2023-05-07 DIAGNOSIS — X58XXXD Exposure to other specified factors, subsequent encounter: Secondary | ICD-10-CM | POA: Diagnosis present

## 2023-05-07 DIAGNOSIS — F1510 Other stimulant abuse, uncomplicated: Secondary | ICD-10-CM | POA: Diagnosis present

## 2023-05-07 DIAGNOSIS — S71111A Laceration without foreign body, right thigh, initial encounter: Secondary | ICD-10-CM | POA: Insufficient documentation

## 2023-05-07 DIAGNOSIS — Z5982 Transportation insecurity: Secondary | ICD-10-CM | POA: Diagnosis not present

## 2023-05-07 DIAGNOSIS — Z91199 Patient's noncompliance with other medical treatment and regimen due to unspecified reason: Secondary | ICD-10-CM | POA: Diagnosis not present

## 2023-05-07 DIAGNOSIS — F3164 Bipolar disorder, current episode mixed, severe, with psychotic features: Secondary | ICD-10-CM | POA: Diagnosis present

## 2023-05-07 DIAGNOSIS — Z5941 Food insecurity: Secondary | ICD-10-CM | POA: Diagnosis not present

## 2023-05-07 LAB — URINE DRUG SCREEN, QUALITATIVE (ARMC ONLY)
Amphetamines, Ur Screen: NOT DETECTED
Barbiturates, Ur Screen: NOT DETECTED
Benzodiazepine, Ur Scrn: NOT DETECTED
Cannabinoid 50 Ng, Ur ~~LOC~~: POSITIVE — AB
Cocaine Metabolite,Ur ~~LOC~~: POSITIVE — AB
MDMA (Ecstasy)Ur Screen: NOT DETECTED
Methadone Scn, Ur: NOT DETECTED
Opiate, Ur Screen: NOT DETECTED
Phencyclidine (PCP) Ur S: NOT DETECTED
Tricyclic, Ur Screen: NOT DETECTED

## 2023-05-07 LAB — COMPREHENSIVE METABOLIC PANEL
ALT: 11 U/L (ref 0–44)
AST: 14 U/L — ABNORMAL LOW (ref 15–41)
Albumin: 4.3 g/dL (ref 3.5–5.0)
Alkaline Phosphatase: 56 U/L (ref 38–126)
Anion gap: 9 (ref 5–15)
BUN: 7 mg/dL (ref 6–20)
CO2: 22 mmol/L (ref 22–32)
Calcium: 8.6 mg/dL — ABNORMAL LOW (ref 8.9–10.3)
Chloride: 106 mmol/L (ref 98–111)
Creatinine, Ser: 0.62 mg/dL (ref 0.44–1.00)
GFR, Estimated: >60 mL/min (ref >60)
Glucose, Bld: 100 mg/dL — ABNORMAL HIGH (ref 70–99)
Potassium: 3.9 mmol/L (ref 3.5–5.1)
Sodium: 137 mmol/L (ref 135–145)
Total Bilirubin: 0.9 mg/dL (ref 0.3–1.2)
Total Protein: 7.6 g/dL (ref 6.5–8.1)

## 2023-05-07 LAB — CBC WITH DIFFERENTIAL/PLATELET
Abs Immature Granulocytes: 0.02 10*3/uL (ref 0.00–0.07)
Basophils Absolute: 0 10*3/uL (ref 0.0–0.1)
Basophils Relative: 0 %
Eosinophils Absolute: 0.2 10*3/uL (ref 0.0–0.5)
Eosinophils Relative: 3 %
HCT: 40.6 % (ref 36.0–46.0)
Hemoglobin: 12.9 g/dL (ref 12.0–15.0)
Immature Granulocytes: 0 %
Lymphocytes Relative: 44 %
Lymphs Abs: 3.6 10*3/uL (ref 0.7–4.0)
MCH: 28.5 pg (ref 26.0–34.0)
MCHC: 31.8 g/dL (ref 30.0–36.0)
MCV: 89.8 fL (ref 80.0–100.0)
Monocytes Absolute: 0.2 10*3/uL (ref 0.1–1.0)
Monocytes Relative: 3 %
Neutro Abs: 4.1 10*3/uL (ref 1.7–7.7)
Neutrophils Relative %: 50 %
Platelets: 199 10*3/uL (ref 150–400)
RBC: 4.52 MIL/uL (ref 3.87–5.11)
RDW: 13.2 % (ref 11.5–15.5)
WBC: 8.2 10*3/uL (ref 4.0–10.5)
nRBC: 0 % (ref 0.0–0.2)

## 2023-05-07 LAB — ACETAMINOPHEN LEVEL: Acetaminophen (Tylenol), Serum: <10 ug/mL — ABNORMAL LOW (ref 10–30)

## 2023-05-07 LAB — ETHANOL: Alcohol, Ethyl (B): 190 mg/dL — ABNORMAL HIGH (ref <10)

## 2023-05-07 LAB — SALICYLATE LEVEL: Salicylate Lvl: <7 mg/dL — ABNORMAL LOW (ref 7.0–30.0)

## 2023-05-07 LAB — PREGNANCY, URINE: Preg Test, Ur: NEGATIVE

## 2023-05-07 MED ORDER — ADULT MULTIVITAMIN W/MINERALS CH
1.0000 | ORAL_TABLET | Freq: Every day | ORAL | Status: DC
  Administered 2023-05-07: 1 via ORAL
  Filled 2023-05-07: qty 1

## 2023-05-07 MED ORDER — THIAMINE MONONITRATE 100 MG PO TABS
100.0000 mg | ORAL_TABLET | Freq: Every day | ORAL | Status: DC
  Administered 2023-05-08 – 2023-05-15 (×8): 100 mg via ORAL
  Filled 2023-05-07 (×8): qty 1

## 2023-05-07 MED ORDER — LORAZEPAM 2 MG PO TABS
0.0000 mg | ORAL_TABLET | Freq: Four times a day (QID) | ORAL | Status: DC
  Administered 2023-05-07: 1 mg via ORAL
  Filled 2023-05-07: qty 1

## 2023-05-07 MED ORDER — THIAMINE MONONITRATE 100 MG PO TABS
100.0000 mg | ORAL_TABLET | Freq: Every day | ORAL | Status: DC
  Administered 2023-05-07: 100 mg via ORAL
  Filled 2023-05-07: qty 1

## 2023-05-07 MED ORDER — LORAZEPAM 2 MG PO TABS: 0.0000 mg | ORAL_TABLET | Freq: Two times a day (BID) | ORAL | Status: DC

## 2023-05-07 MED ORDER — LORATADINE 10 MG PO TABS
10.0000 mg | ORAL_TABLET | Freq: Every day | ORAL | Status: DC
  Administered 2023-05-07 – 2023-05-15 (×9): 10 mg via ORAL
  Filled 2023-05-07 (×9): qty 1

## 2023-05-07 MED ORDER — LORAZEPAM 2 MG PO TABS
0.0000 mg | ORAL_TABLET | Freq: Four times a day (QID) | ORAL | Status: DC
  Administered 2023-05-08: 1 mg via ORAL
  Filled 2023-05-07: qty 1

## 2023-05-07 MED ORDER — ALUM & MAG HYDROXIDE-SIMETH 200-200-20 MG/5ML PO SUSP
30.0000 mL | ORAL | Status: DC | PRN
  Administered 2023-05-12 – 2023-05-13 (×2): 30 mL via ORAL
  Filled 2023-05-07 (×3): qty 30

## 2023-05-07 MED ORDER — DIPHENHYDRAMINE HCL 50 MG/ML IJ SOLN: 50.0000 mg | Freq: Three times a day (TID) | INTRAMUSCULAR | Status: DC | PRN

## 2023-05-07 MED ORDER — HYDROXYZINE HCL 25 MG PO TABS: 25.0000 mg | ORAL_TABLET | Freq: Three times a day (TID) | ORAL | Status: DC | PRN

## 2023-05-07 MED ORDER — ACETAMINOPHEN 325 MG PO TABS
650.0000 mg | ORAL_TABLET | Freq: Four times a day (QID) | ORAL | Status: DC | PRN
  Administered 2023-05-08 – 2023-05-15 (×12): 650 mg via ORAL
  Filled 2023-05-07 (×13): qty 2

## 2023-05-07 MED ORDER — ACETAMINOPHEN 325 MG PO TABS
650.0000 mg | ORAL_TABLET | Freq: Once | ORAL | Status: AC
  Administered 2023-05-07: 650 mg via ORAL
  Filled 2023-05-07: qty 2

## 2023-05-07 MED ORDER — TRAZODONE HCL 100 MG PO TABS: 100.0000 mg | ORAL_TABLET | Freq: Every evening | ORAL | Status: DC | PRN

## 2023-05-07 MED ORDER — LORAZEPAM 1 MG PO TABS: 1.0000 mg | ORAL_TABLET | ORAL | Status: DC | PRN

## 2023-05-07 MED ORDER — LORAZEPAM 2 MG/ML IJ SOLN: 2.0000 mg | Freq: Three times a day (TID) | INTRAMUSCULAR | Status: DC | PRN

## 2023-05-07 MED ORDER — FOLIC ACID 1 MG PO TABS
1.0000 mg | ORAL_TABLET | Freq: Every day | ORAL | Status: DC
  Administered 2023-05-07: 1 mg via ORAL
  Filled 2023-05-07: qty 1

## 2023-05-07 MED ORDER — HALOPERIDOL 5 MG PO TABS
5.0000 mg | ORAL_TABLET | Freq: Three times a day (TID) | ORAL | Status: DC | PRN
  Administered 2023-05-08: 5 mg via ORAL
  Filled 2023-05-07: qty 1

## 2023-05-07 MED ORDER — TRAZODONE HCL 100 MG PO TABS
100.0000 mg | ORAL_TABLET | Freq: Every evening | ORAL | Status: DC | PRN
  Administered 2023-05-07 – 2023-05-14 (×7): 100 mg via ORAL
  Filled 2023-05-07 (×8): qty 1

## 2023-05-07 MED ORDER — LACTATED RINGERS IV BOLUS
1000.0000 mL | Freq: Once | INTRAVENOUS | Status: AC
  Administered 2023-05-07: 1000 mL via INTRAVENOUS

## 2023-05-07 MED ORDER — DIPHENHYDRAMINE HCL 25 MG PO CAPS
50.0000 mg | ORAL_CAPSULE | Freq: Three times a day (TID) | ORAL | Status: DC | PRN
  Administered 2023-05-08: 50 mg via ORAL
  Filled 2023-05-07: qty 2

## 2023-05-07 MED ORDER — FLUTICASONE PROPIONATE 50 MCG/ACT NA SUSP
2.0000 | Freq: Every day | NASAL | Status: DC | PRN
  Administered 2023-05-09 – 2023-05-12 (×3): 2 via NASAL
  Filled 2023-05-07: qty 16

## 2023-05-07 MED ORDER — HALOPERIDOL LACTATE 5 MG/ML IJ SOLN: 5.0000 mg | Freq: Three times a day (TID) | INTRAMUSCULAR | Status: DC | PRN

## 2023-05-07 MED ORDER — ADULT MULTIVITAMIN W/MINERALS CH
1.0000 | ORAL_TABLET | Freq: Every day | ORAL | Status: DC
  Administered 2023-05-08 – 2023-05-14 (×6): 1 via ORAL
  Filled 2023-05-07 (×8): qty 1

## 2023-05-07 MED ORDER — HYDROXYZINE HCL 25 MG PO TABS
25.0000 mg | ORAL_TABLET | Freq: Three times a day (TID) | ORAL | Status: DC | PRN
  Administered 2023-05-09: 25 mg via ORAL
  Filled 2023-05-07: qty 1

## 2023-05-07 MED ORDER — FOLIC ACID 1 MG PO TABS
1.0000 mg | ORAL_TABLET | Freq: Every day | ORAL | Status: DC
  Administered 2023-05-08 – 2023-05-15 (×8): 1 mg via ORAL
  Filled 2023-05-07 (×8): qty 1

## 2023-05-07 MED ORDER — LORAZEPAM 2 MG PO TABS
2.0000 mg | ORAL_TABLET | Freq: Three times a day (TID) | ORAL | Status: DC | PRN
  Administered 2023-05-08: 2 mg via ORAL
  Filled 2023-05-07: qty 1

## 2023-05-07 MED ORDER — SERTRALINE HCL 50 MG PO TABS: 50.0000 mg | ORAL_TABLET | Freq: Every day | ORAL | Status: DC

## 2023-05-07 MED ORDER — MAGNESIUM HYDROXIDE 400 MG/5ML PO SUSP
30.0000 mL | Freq: Every day | ORAL | Status: DC | PRN
  Administered 2023-05-11: 30 mL via ORAL
  Filled 2023-05-07: qty 30

## 2023-05-07 MED ORDER — ONDANSETRON 4 MG PO TBDP
4.0000 mg | ORAL_TABLET | Freq: Once | ORAL | Status: AC
  Administered 2023-05-07: 4 mg via ORAL
  Filled 2023-05-07: qty 1

## 2023-05-07 NOTE — ED Triage Notes (Signed)
Pt from home via ems- pt reports she has been drinking brandy tonight. States that she feels helpless but denies SI/HI. Supposed to take antidepressants but has not been taking meds for a couple years.

## 2023-05-07 NOTE — ED Notes (Signed)
Patient Belongings: -Colorful leggings -Gray shirt -Purple bonnet -Black bra -cell phone  -black sandals

## 2023-05-07 NOTE — ED Notes (Signed)
Pt given water at request.

## 2023-05-07 NOTE — BH Assessment (Signed)
Comprehensive Clinical Assessment (CCA) Note  05/07/2023 [name] [id number]  Chief Complaint:  Chief Complaint  Patient presents with   Alcohol Intoxication   Visit Diagnosis: Major Depression   [name] is 37 year old female who presents to the ER due to having thoughts of ending her life. She has a plan of cutting herself. Per her report, she has started cutting but it's superficial. Patient states the thoughts have been ongoing, but they have increased with in the last two weeks. She's afraid she is going to cut and end her life, if she continues in her current state of mind. They are at the point she doesn't feel she can be safe,   During the interview, the patient was calm, cooperative and pleasant. She was able to provide appropriate answer to the questions. She denies HI and AV/H.  CCA Screening, Triage and Referral (STR)  Patient Reported Information How did you hear about [place]? Self  What Is the Reason for Your Visit/Call Today? Patient having thoughts of ending her life.  How Long Has This Been Causing You Problems? 1 wk - 1 month  What Do You Feel Would Help You the Most Today? Treatment for Depression or other mood problem; Alcohol or Drug Use Treatment   Have You Recently Had Any Thoughts About Hurting Yourself? Yes  Are You Planning to Commit Suicide/Harm Yourself At This time? Yes   Flowsheet Row ED from 05/07/2023 in [facility] Emergency Department at [facility] ED from 04/20/2023 in [facility] Emergency Department at [facility] Admission (Discharged) from 07/11/2022 in [facility] INPATIENT BEHAVIORAL MEDICINE  C-SSRS RISK CATEGORY High Risk No Risk Low Risk       Have you Recently Had Thoughts About Hurting Someone [name]? No  Are You Planning to Harm Someone at This Time? No  Explanation: No data recorded  Have You Used Any Alcohol or Drugs in the Past 24 Hours? Yes  What Did You Use and How Much? Cannabis and Cocaine   Do You  Currently Have a Therapist/Psychiatrist? No  Name of Therapist/Psychiatrist:    Have You Been Recently Discharged From Any Office Practice or Programs? No  Explanation of Discharge From Practice/Program: No data recorded    CCA Screening Triage Referral Assessment Type of Contact: Face-to-Face  Telemedicine Service Delivery:   Is this Initial or Reassessment?   Date Telepsych consult ordered in [facility]:    Time Telepsych consult ordered in [facility]:    Location of Assessment: [facility] ED  Provider Location: [facility] ED   Collateral Involvement: No data recorded  Does Patient Have a Court Appointed Legal Guardian? No  Legal Guardian Contact Information: No data recorded Copy of Legal Guardianship Form: No data recorded Legal Guardian Notified of Arrival: No data recorded Legal Guardian Notified of Pending Discharge: No data recorded If Minor and Not Living with Parent(s), Who has Custody? No data recorded Is CPS involved or ever been involved? In the Past  Is APS involved or ever been involved? Never   Patient Determined To Be At Risk for Harm To Self or Others Based on Review of Patient Reported Information or Presenting Complaint? Yes, for Self-Harm  Method: No data recorded Availability of Means: No data recorded Intent: No data recorded Notification Required: No data recorded Additional Information for Danger to Others Potential: No data recorded Additional Comments for Danger to Others Potential: No data recorded Are There Guns or Other Weapons in Your Home? No data recorded Types of Guns/Weapons: No data recorded  Are These Weapons Safely Secured?                            No data recorded Who Could Verify You Are Able To Have These Secured: No data recorded Do You Have any Outstanding Charges, Pending Court Dates, Parole/Probation? No data recorded Contacted To Inform of Risk of Harm To Self or Others: No data recorded   Does Patient Present under Involuntary Commitment?  No    [place] of Residence: [place]   Patient Currently Receiving the Following Services: Not Receiving Services   Determination of Need: Emergent (2 hours)   Options For Referral: ED Visit; Inpatient Hospitalization    CCA Biopsychosocial Patient Reported Schizophrenia/Schizoaffective Diagnosis in Past: No   Strengths: Some insight, seeking help and stable housing.   Mental Health Symptoms Depression:   Hopelessness; Irritability; Sleep (too much or little); Difficulty Concentrating   Duration of Depressive symptoms:  Duration of Depressive Symptoms: Greater than two weeks   Mania:   N/A   Anxiety:    N/A   Psychosis:   None   Duration of Psychotic symptoms:    Trauma:   N/A   Obsessions:   N/A   Compulsions:   N/A   Inattention:   N/A   Hyperactivity/Impulsivity:   N/A   Oppositional/Defiant Behaviors:   N/A   Emotional Irregularity:   N/A   Other Mood/Personality Symptoms:  No data recorded   Mental Status Exam Appearance and self-care  Stature:   Average   Weight:   Average weight   Clothing:   Neat/clean; Age-appropriate   Grooming:   Normal   Cosmetic use:   None   Posture/gait:   Normal   Motor activity:   -- (Within normal range)   Sensorium  Attention:   Normal   Concentration:   Normal   Orientation:   X5   Recall/memory:   Normal   Affect and Mood  Affect:   Appropriate; Full Range   Mood:   Depressed; Anxious   Relating  Eye contact:   Normal   Facial expression:   Depressed   Attitude toward examiner:   Cooperative   Thought and Language  Speech flow:  Clear and Coherent   Thought content:   Appropriate to Mood and Circumstances   Preoccupation:   None   Hallucinations:   None   Organization:   Intact; Coherent   [employer] of Knowledge:   Fair   Intelligence:   Average   Abstraction:   Functional   Judgement:   Fair   Dance movement psychotherapist:    Adequate   Insight:   Fair   Decision Making:   Normal   Social Functioning  Social Maturity:   Impulsive; Isolates   Social Judgement:   Heedless; "Street Smart"   Stress  Stressors:   Relationship; Transitions   Coping Ability:   Deficient supports; Overwhelmed   Skill Deficits:   None   Supports:   Support needed     Religion: Religion/Spirituality Are You A Religious Person?: No  Leisure/Recreation: Leisure / Recreation Do You Have Hobbies?: No  Exercise/Diet: Exercise/Diet Do You Exercise?: No Have You Gained or Lost A Significant Amount of Weight in the Past Six Months?: No Do You Follow a Special Diet?: No Do You Have Any Trouble Sleeping?: No   CCA Employment/Education Employment/Work Situation: Employment / Work Situation Employment Situation: On disability Has Patient ever Been in  the [employer]?: No  Education: Education Is Patient Currently Attending School?: No Did You Attend College?: No Did You Have An Individualized Education Program (IIEP): No Did You Have Any Difficulty At School?: No Patient's Education Has Been Impacted by Current Illness: No   CCA Family/Childhood History Family and Relationship History: Family history Marital status: Single Does patient have children?: Yes How is patient's relationship with their children?: Children in DSS Custody.  Childhood History:  Childhood History By whom was/is the patient raised?: Mother Did patient suffer any verbal/emotional/physical/sexual abuse as a child?: No Did patient suffer from severe childhood neglect?: No Has patient ever been sexually abused/assaulted/raped as an adolescent or adult?: No Was the patient ever a victim of a crime or a disaster?: No Witnessed domestic violence?: No Has patient been affected by domestic violence as an adult?: No   CCA Substance Use Alcohol/Drug Use: Alcohol / Drug Use Pain Medications: See MAR Prescriptions: See MAR Over the  Counter: See MAR History of alcohol / drug use?: No history of alcohol / drug abuse Longest period of sobriety (when/how long): Unable to quantify Negative Consequences of Use: Personal relationships, Financial   ASAM's:  Six Dimensions of Multidimensional Assessment  Dimension 1:  Acute Intoxication and/or Withdrawal Potential:      Dimension 2:  Biomedical Conditions and Complications:      Dimension 3:  Emotional, Behavioral, or Cognitive Conditions and Complications:     Dimension 4:  Readiness to Change:     Dimension 5:  Relapse, Continued use, or Continued Problem Potential:     Dimension 6:  Recovery/Living Environment:     ASAM Severity Score:    ASAM Recommended Level of Treatment:     Substance use Disorder (SUD)    Recommendations for Services/Supports/Treatments:    Discharge Disposition:    DSM5 Diagnoses: Patient Active Problem List   Diagnosis Date Noted   Schizophrenia (HCC) 07/11/2022   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   Supervision of high risk pregnancy, antepartum 07/23/2020   Substance abuse complicating pregnancy, antepartum (HCC) 07/23/2020   Bipolar disease in pregnancy, unspecified trimester (HCC) 07/23/2020   Genital herpes affecting pregnancy 07/23/2020   History of prior pregnancy with IUGR newborn 07/23/2020   Asthma affecting pregnancy, antepartum 07/23/2020   History of fetal anomaly in prior pregnancy, currently pregnant 07/23/2020   MDD (major depressive disorder), recurrent severe, without psychosis (HCC) 07/18/2020   Major depressive disorder, recurrent severe without psychotic features (HCC) 07/15/2020   Severe recurrent major depression without psychotic features (HCC) 06/03/2020   History of cesarean delivery 05/28/2020   Bipolar disorder, curr episode mixed, severe, with psychotic features (HCC)    MDD (major depressive disorder), recurrent episode, severe (HCC) 04/08/2020   Thoughts of  self harm 02/01/2020   Cocaine use disorder, moderate, dependence (HCC) 01/01/2018   Cannabis use disorder, moderate, dependence (HCC) 01/01/2018   Pregnant 12/31/2017   Medically noncompliant 12/31/2017   Tobacco use disorder 01/12/2016   Stimulant use disorder (HCC) (cocaine) 01/12/2016   Alcohol use disorder, moderate, dependence (HCC) 01/12/2016   Self-inflicted laceration of wrist (HCC) 01/11/2016   HSIL on Pap smear of cervix 05/17/2015    Referrals to Alternative Service(s): Referred to Alternative Service(s):   Place:   Date:   Time:    Referred to Alternative Service(s):   Place:   Date:   Time:    Referred to Alternative Service(s):   Place:   Date:   Time:  Referred to Alternative Service(s):   Place:   Date:   Time:     Lilyan Gilford MS, LCAS, [facility], [facility] Therapeutic Triage Specialist 05/07/2023 1:36 PM
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
## 2023-05-07 NOTE — ED Notes (Signed)
RN to pt bedside--pt states, "Nobody cares about me" but refuses to elaborate to this RN. RN removed IV per pt request and brought beverage.

## 2023-05-07 NOTE — ED Provider Notes (Signed)
[facility] Provider Note    Event Date/Time   First MD Initiated Contact with Patient 05/07/23 [id number]     (approximate)   History   Alcohol Intoxication   HPI  [name] is a 37 y.o. female who presents to the ED for evaluation of Alcohol Intoxication   I review psychiatric DC summary from about 10 months ago.  Patient was admitted for 3 days with psychiatry due to substance abuse and mood disorder.  Patient presents to the ED via EMS from home for evaluation of her alcohol intake and suicidal thoughts.  She reports that she "just does not want to be here anymore, no one likes me."  She reports she does not want to use alcohol or drugs but she cannot help her self.  She shows me healing superficial lacerations to her right anterior thigh.  Does not have any active plans right now but reports anhedonia and passive suicidal thoughts.  No recent illnesses.   Physical Exam   Triage Vital Signs: ED Triage Vitals  Enc Vitals Group     BP 05/07/23 0215 (!) 179/110     Pulse Rate 05/07/23 0215 74     Resp 05/07/23 0215 20     Temp 05/07/23 0215 98.4 F (36.9 C)     Temp Source 05/07/23 0215 Oral     SpO2 05/07/23 0215 96 %     Weight 05/07/23 0218 135 lb (61.2 kg)     Height 05/07/23 0218 4\' 11"  (1.499 m)     Head Circumference --      Peak Flow --      Pain Score 05/07/23 0218 0     Pain Loc --      Pain Edu? --      Excl. in GC? --     Most recent vital signs: Vitals:   05/07/23 0330 05/07/23 0400  BP: 131/74 127/65  Pulse: 60 74  Resp:    Temp:    SpO2: 99% 100%    General: Awake, no distress.  Intoxicated, crying CV:  Good peripheral perfusion.  Resp:  Normal effort.  Abd:  No distention.  Soft and benign MSK:  No deformity noted.  Healing and scabbing over superficial abrasions/superficial laceration to the right anterior thigh.  No superimposed infectious features Neuro:  No focal deficits appreciated. Other:     ED  Results / Procedures / Treatments   Labs (all labs ordered are listed, but only abnormal results are displayed) Labs Reviewed  COMPREHENSIVE METABOLIC PANEL - Abnormal; Notable for the following components:      Result Value   Glucose, Bld 100 (*)    Calcium 8.6 (*)    AST 14 (*)    All other components within normal limits  SALICYLATE LEVEL - Abnormal; Notable for the following components:   Salicylate Lvl <7.0 (*)    All other components within normal limits  CBC WITH DIFFERENTIAL/PLATELET  URINE DRUG SCREEN, QUALITATIVE ([facility] ONLY)  ACETAMINOPHEN LEVEL  ETHANOL  POC URINE PREG, ED  POC URINE PREG, ED    EKG   RADIOLOGY   Official radiology report(s): No results found.  PROCEDURES and INTERVENTIONS:  Procedures  Medications  lactated ringers bolus 1,000 mL (0 mLs Intravenous Stopped 05/07/23 0310)     IMPRESSION / MDM / ASSESSMENT AND PLAN / ED COURSE  I reviewed the triage vital signs and the nursing notes.  Differential diagnosis includes, but is not limited to, polysubstance abuse,  suicidal, substance induced mood disorder, hepatic cephalopathy, alcoholic ketoacidosis  {Patient presents with symptoms of an acute illness or injury that is potentially life-threatening.  Patient presents drunk with passive suicidal thoughts and anhedonia.  She has reassuring vital signs and basic labs with a normal CBC and metabolic panel.  She was to be here and is suitable to remain voluntary at this point.  We will consult psychiatry.  No evidence of acute medical pathology to preclude psychiatric evaluation.  Clinical Course as of 05/07/23 0521  Mon May 07, 2023  0234 The patient has been placed in psychiatric observation due to the need to provide a safe environment for the patient while obtaining psychiatric consultation and evaluation, as well as ongoing medical and medication management to treat the patient's condition.  The patient has not been placed under full IVC at  this time.   [DS]    Clinical Course User Index [DS] Delton Prairie, MD     FINAL CLINICAL IMPRESSION(S) / ED DIAGNOSES   Final diagnoses:  Alcoholic intoxication with complication (HCC)  Passive suicidal ideations     Rx / DC Orders   ED Discharge Orders     None        Note:  This document was prepared using Dragon voice recognition software and may include unintentional dictation errors.   Delton Prairie, MD 05/07/23 [phone]

## 2023-05-07 NOTE — ED Notes (Signed)
Pt given po food and liquids

## 2023-05-07 NOTE — ED Notes (Signed)
Provider was wondering where the ethanol level was. Lab was called--apparently they canceled the order and are getting ready to run the level now. Dr. Katrinka Blazing will be made aware.

## 2023-05-07 NOTE — Tx Team (Signed)
Initial Treatment Plan 05/07/2023 3:02 PM [name] ZOX:[id number]    PATIENT STRESSORS: Loss of mother   Marital or family conflict   Substance abuse     PATIENT STRENGTHS: Ability for insight  Active sense of humor  Average or above average intelligence  Capable of independent living  Communication skills  General fund of knowledge    PATIENT IDENTIFIED PROBLEMS:     Depression Grief and loss of mother Substance use (alcohol)                 DISCHARGE CRITERIA:  Ability to meet basic life and health needs Improved stabilization in mood, thinking, and/or behavior Motivation to continue treatment in a less acute level of care Reduction of life-threatening or endangering symptoms to within safe limits Verbal commitment to aftercare and medication compliance  PRELIMINARY DISCHARGE PLAN: Attend PHP/IOP Attend 12-step recovery group Outpatient therapy  PATIENT/FAMILY INVOLVEMENT: This treatment plan has been presented to and reviewed with the patient, [name], and/or family member, .  The patient and family have been given the opportunity to ask questions and make suggestions.  Malva Limes, RN 05/07/2023, 3:02 PM

## 2023-05-07 NOTE — Progress Notes (Signed)
Admission Note:   Report was received from [place], [place] on a 37 year-old female who presents Voluntary in no acute distress for the treatment of SI, Alcohol abuse and Depression. Patient appears sad upon admission, but was pleasant and brightened up when she saw this Clinical research associate. Patient was calm and cooperative with admission process. Patient denies anxiety, but endorsed depression, rating it an "8/10". Patient reports that she hasn't been on her medication since her last admission and that she has not been receiving any help from [facility], nor her peer support, which is why she hasn't been on her medicine. Patient denies SI/HI/AVH and pain at this time. Patient states that she does not have any family nor friend support. Patient states that all her friends "all they want to do is get me drunk". Patient states that her stressors are "my house, where my Mom died", and that she hasn't been able to get in to see her doctor. Patient's goal for treatment are "hoping to try to get better and keep my mind clear", and to "get back on medication". Patient has a past medical history of Anxiety, Depression, HTN and Gestational Diabetes. Skin was assessed with Bess Harvest, RN and found to be clear of any abnormal marks apart from healed superficial cuts to her left wrist and right upper thigh. Patient searched and no contraband found and unit policies explained and understanding verbalized. Consents obtained. Food and fluids offered, and both accepted. Patient had no additional questions or concerns to voice to this writer at this time. Patient remains safe on the unit.

## 2023-05-07 NOTE — Progress Notes (Signed)
   05/07/23 2000  Psych Admission Type (Psych Patients Only)  Admission Status Voluntary  Psychosocial Assessment  Patient Complaints Crying spells  Eye Contact Fair;Brief  Facial Expression Sad;Trembling lip  Affect Depressed  Speech Logical/coherent  Interaction Cautious;Defensive;Guarded  Motor Activity Slow  Appearance/Hygiene In scrubs;Unremarkable  Behavior Characteristics Anxious;Guarded;Irritable  Mood Labile;Sad;Preoccupied  Thought Process  Coherency WDL  Content Preoccupation  Delusions WDL  Perception WDL  Hallucination None reported or observed  Judgment Limited  Confusion None  Danger to Self  Current suicidal ideation? Denies  Danger to Others  Danger to Others None reported or observed

## 2023-05-07 NOTE — ED Notes (Signed)
Pt was moved to the hallway and remains asleep.  She denies SI/HI. Was given a tray and water and tolerated po intake.

## 2023-05-07 NOTE — Plan of Care (Signed)

## 2023-05-07 NOTE — Discharge Instructions (Addendum)
To inpatient psych for further management.

## 2023-05-08 DIAGNOSIS — F332 Major depressive disorder, recurrent severe without psychotic features: Secondary | ICD-10-CM | POA: Diagnosis not present

## 2023-05-08 NOTE — Group Note (Signed)
Recreation Therapy Group Note   Group Topic:Coping Skills  Group Date: 05/08/2023 Start Time: 1000 End Time: 1050 Facilitators: Rosina Lowenstein, LRT, CTRS Location:  Craft Room  Group Description: Mind Map.  Patient was provided a blank template of a diagram with 32 blank boxes in a tiered system, branching from the center (similar to a bubble chart). LRT directed patients to label the middle of the diagram "Coping Skills". LRT and patients then came up with 8 different coping skills as examples. Pt were directed to record their coping skills in the 2nd tier boxes closest to the center.  Patients would then share their coping skills with the group as LRT wrote them out. LRT gave a handout of 100 different coping skills at the end of group.   Goal Area(s) Addressed: Patients will be able to define "coping skills". Patient will identify new coping skills.  Patient will identify new possible leisure interests.   Affect/Mood: N/A   Participation Level: Did not attend    Clinical Observations/Individualized Feedback: Valeria did not attend group due to resting in her room.  Plan: Continue to engage patient in RT group sessions 2-3x/week.   Rosina Lowenstein, LRT, CTRS 05/08/2023 11:10 AM

## 2023-05-08 NOTE — [facility] Group Notes (Signed)
[facility] Group Notes:  (Nursing/MHT/Case Management/Adjunct)  Date:  05/08/2023  Time:  3:55 PM  Type of Therapy:   recreational activity- BINGO  Participation Level:  Did Not Attend  Participation Quality:   NA  Affect:   NA  Cognitive:   NA  Insight:  None  Engagement in Group:   NA  Modes of Intervention:   NA  Summary of Progress/Problems: bingo game activity with snack prizes. Pt did not attend.   Malva Limes 05/08/2023, 3:55 PM

## 2023-05-08 NOTE — H&P (Signed)
Psychiatric Admission Assessment Adult  Patient Identification: [name] MRN:  [id number] Date of Evaluation:  05/08/2023 Chief Complaint:  MDD (major depressive disorder), recurrent episode, severe (HCC) [F33.2] Principal Diagnosis: MDD (major depressive disorder), recurrent episode, severe (HCC) Diagnosis:  Principal Problem:   MDD (major depressive disorder), recurrent episode, severe (HCC) Active Problems:   Stimulant use disorder (HCC) (cocaine)   Alcohol use disorder, moderate, dependence (HCC)   Cocaine use disorder, moderate, dependence (HCC)  History of Present Illness: Patient seen as much as I could and chart reviewed.  Patient known from previous encounters.  37 year old woman with a history of mood symptoms and substance abuse presented to the emergency room stating that she was depressed and wanted to be in the hospital.  She said she had relapsed into alcohol and cocaine use.  Blood alcohol level was elevated at the time.  Patient described mood is depressed.  No evidence of acute self-injury denied any specific suicidal intent.  Not reporting psychotic symptoms.  On interview today the patient declined to participate in any conversation with me declined to get out of bed or even make eye contact. Associated Signs/Symptoms: Depression Symptoms:  depressed mood, fatigue, (Hypo) Manic Symptoms:  Irritable Mood, Anxiety Symptoms:  Excessive Worry, Psychotic Symptoms:   None PTSD Symptoms: NA Total Time spent with patient: 45 minutes  Past Psychiatric History: Patient has had multiple presentations to the hospital in the past for similar symptoms often related to substance abuse and ongoing mood symptoms.  At times in the past has been pregnant while abusing drugs.  Currently does not appear to be pregnant.  Has been noncompliant with outpatient treatment.  No history of actual suicide attempts  Is the patient at risk to self? No.  Has the patient been a risk to self  in the past 6 months? No.  Has the patient been a risk to self within the distant past? No.  Is the patient a risk to others? No.  Has the patient been a risk to others in the past 6 months? No.  Has the patient been a risk to others within the distant past? No.   [place] Scale:  Flowsheet Row Admission (Current) from 05/07/2023 in [facility]PATIENT BE[facility]INE Most recent reading at 05/07/2023  8:46 PM ED from 05/07/2023 in [facility] Emergency Department at [facility] Most recent reading at 05/07/2023  1:28 PM ED from 04/20/2023 in [facility] Emergency Department at [facility] Most recent reading at 04/20/2023  1:43 PM  C-SSRS RISK CATEGORY Error: Question 1 not populated High Risk No Risk        Prior Inpatient Therapy: Yes.   If yes, describe multiple prior hospitalizations similar circumstances Prior Outpatient Therapy: Yes.   If yes, describe minimal outpatient compliance  Alcohol Screening: 1. How often do you have a drink containing alcohol?: 2 to 4 times a month 2. How many drinks containing alcohol do you have on a typical day when you are drinking?: 3 or 4 3. How often do you have six or more drinks on one occasion?: Less than monthly AUDIT-C Score: 4 4. How often during the last year have you found that you were not able to stop drinking once you had started?: Never 5. How often during the last year have you failed to do what was normally expected from you because of drinking?: Never 6. How often during the last year have you needed a first drink in the morning to get yourself going after  a heavy drinking session?: Never 7. How often during the last year have you had a feeling of guilt of remorse after drinking?: Never 8. How often during the last year have you been unable to remember what happened the night before because you had been drinking?: Never 9. Have you or someone else been injured as a result of your drinking?: No 10. Has a relative or friend or a  doctor or another health worker been concerned about your drinking or suggested you cut down?: No Alcohol Use Disorder Identification Test Final Score (AUDIT): 4 Alcohol Brief Interventions/Follow-up: Patient Refused Substance Abuse History in the last 12 months:  Yes.   Consequences of Substance Abuse: Cocaine and alcohol abuse clearly contribute to mood symptoms and general dysfunction Previous Psychotropic Medications: Yes  Psychological Evaluations: Yes  Past Medical History:  Past Medical History:  Diagnosis Date   Anxiety    Depression    Gestational diabetes    History of substance abuse (HCC)    History of suicide attempt    History of thyroid disease    Hypertension    Thyroid disease     Past Surgical History:  Procedure Laterality Date   CESAREAN SECTION  2000, 2002, 2013   WISDOM TOOTH EXTRACTION     Family History:  Family History  Problem Relation Age of Onset   Diabetes Mother    Family Psychiatric  History: None reported Tobacco Screening:  Social History   Tobacco Use  Smoking Status Former   Packs/day: .25   Types: Cigarettes  Smokeless Tobacco Never    BH Tobacco Counseling     Are you interested in Tobacco Cessation Medications?  No, patient refused Counseled patient on smoking cessation:  N/A, patient does not use tobacco products Reason Tobacco Screening Not Completed: Patient Refused Screening       Social History:  Social History   Substance and Sexual Activity  Alcohol Use Yes   Comment: ocassionlly     Social History   Substance and Sexual Activity  Drug Use Not Currently   Types: Cocaine, Marijuana   Comment: Positive UDS 12/2017, last used mj 3 days ago , last used cocaine  a couple of months ago     Additional Social History:                           Allergies:   Allergies  Allergen Reactions   Aspirin Anaphylaxis, Diarrhea and Nausea And Vomiting   Banana Anaphylaxis, Itching, Shortness Of Breath and  Swelling   Other Itching, Shortness Of Breath and Swelling   Peanut Oil Itching, Shortness Of Breath and Swelling   Peanut-Containing Drug Products Shortness Of Breath, Itching and Swelling   Pecan Nut (Diagnostic) Anaphylaxis   Latex Itching   Lab Results:  Results for orders placed or performed during the hospital encounter of 05/07/23 (from the past 48 hour(s))  Comprehensive metabolic panel     Status: Abnormal   Collection Time: 05/07/23  2:21 AM  Result Value Ref Range   Sodium 137 135 - 145 mmol/L   Potassium 3.9 3.5 - 5.1 mmol/L   Chloride 106 98 - 111 mmol/L   CO2 22 22 - 32 mmol/L   Glucose, Bld 100 (H) 70 - 99 mg/dL    Comment: Glucose reference range applies only to samples taken after fasting for at least 8 hours.   BUN 7 6 - 20 mg/dL   Creatinine, Ser 4.09 0.44 -  1.00 mg/dL   Calcium 8.6 (L) 8.9 - 10.3 mg/dL   Total Protein 7.6 6.5 - 8.1 g/dL   Albumin 4.3 3.5 - 5.0 g/dL   AST 14 (L) 15 - 41 U/L   ALT 11 0 - 44 U/L   Alkaline Phosphatase 56 38 - 126 U/L   Total Bilirubin 0.9 0.3 - 1.2 mg/dL   GFR, Estimated >16 >10 mL/min    Comment: (NOTE) Calculated using the CKD-EPI Creatinine Equation (2021)    Anion gap 9 5 - 15    Comment: Performed at [facility], [address] [place], [place] [address]  CBC with Diff     Status: None   Collection Time: 05/07/23  2:21 AM  Result Value Ref Range   WBC 8.2 4.0 - 10.5 K/uL   RBC 4.52 3.87 - 5.11 MIL/uL   Hemoglobin 12.9 12.0 - 15.0 g/dL   HCT 40.9 81.1 - 91.4 %   MCV 89.8 80.0 - 100.0 fL   MCH 28.5 26.0 - 34.0 pg   MCHC 31.8 30.0 - 36.0 g/dL   RDW 78.2 95.6 - 21.3 %   Platelets 199 150 - 400 K/uL   nRBC 0.0 0.0 - 0.2 %   Neutrophils Relative % 50 %   Neutro Abs 4.1 1.7 - 7.7 K/uL   Lymphocytes Relative 44 %   Lymphs Abs 3.6 0.7 - 4.0 K/uL   Monocytes Relative 3 %   Monocytes Absolute 0.2 0.1 - 1.0 K/uL   Eosinophils Relative 3 %   Eosinophils Absolute 0.2 0.0 - 0.5 K/uL   Basophils Relative 0 %    Basophils Absolute 0.0 0.0 - 0.1 K/uL   Immature Granulocytes 0 %   Abs Immature Granulocytes 0.02 0.00 - 0.07 K/uL    Comment: Performed at [facility], [address] [place], [place] [address]  Salicylate level     Status: Abnormal   Collection Time: 05/07/23  2:21 AM  Result Value Ref Range   Salicylate Lvl <7.0 (L) 7.0 - 30.0 mg/dL    Comment: Performed at [facility], [address] [place], [place] [address]  Acetaminophen level     Status: Abnormal   Collection Time: 05/07/23  3:15 AM  Result Value Ref Range   Acetaminophen (Tylenol), Serum <10 (L) 10 - 30 ug/mL    Comment: (NOTE) Therapeutic concentrations vary significantly. A range of 10-30 ug/mL  may be an effective concentration for many patients. However, some  are best treated at concentrations outside of this range. Acetaminophen concentrations >150 ug/mL at 4 hours after ingestion  and >50 ug/mL at 12 hours after ingestion are often associated with  toxic reactions.  Performed at [facility], [address] [place], [place] [address]   Ethanol     Status: Abnormal   Collection Time: 05/07/23  3:15 AM  Result Value Ref Range   Alcohol, Ethyl (B) 190 (H) <10 mg/dL    Comment: (NOTE) Lowest detectable limit for serum alcohol is 10 mg/dL.  For medical purposes only. Performed at [facility], [address], [place], [place] [address]   Urine Drug Screen, Qualitative     Status: Abnormal   Collection Time: 05/07/23  8:00 AM  Result Value Ref Range   Tricyclic, Ur Screen NONE DETECTED NONE DETECTED   Amphetamines, Ur Screen NONE DETECTED NONE DETECTED   MDMA (Ecstasy)Ur Screen NONE DETECTED NONE DETECTED   Cocaine Metabolite,Ur [place] POSITIVE (A) NONE DETECTED   Opiate, Ur  Screen NONE DETECTED NONE DETECTED   Phencyclidine (PCP) Ur S NONE DETECTED NONE DETECTED   Cannabinoid 50 Ng, Ur [employer] POSITIVE (A) NONE DETECTED   Barbiturates, Ur Screen NONE DETECTED NONE DETECTED    Benzodiazepine, Ur Scrn NONE DETECTED NONE DETECTED   Methadone Scn, Ur NONE DETECTED NONE DETECTED    Comment: (NOTE) Tricyclics + metabolites, urine    Cutoff 1000 ng/mL Amphetamines + metabolites, urine  Cutoff 1000 ng/mL MDMA (Ecstasy), urine              Cutoff 500 ng/mL Cocaine Metabolite, urine          Cutoff 300 ng/mL Opiate + metabolites, urine        Cutoff 300 ng/mL Phencyclidine (PCP), urine         Cutoff 25 ng/mL Cannabinoid, urine                 Cutoff 50 ng/mL Barbiturates + metabolites, urine  Cutoff 200 ng/mL Benzodiazepine, urine              Cutoff 200 ng/mL Methadone, urine                   Cutoff 300 ng/mL  The urine drug screen provides only a preliminary, unconfirmed analytical test result and should not be used for non-medical purposes. Clinical consideration and professional judgment should be applied to any positive drug screen result due to possible interfering substances. A more specific alternate chemical method must be used in order to obtain a confirmed analytical result. Gas chromatography / mass spectrometry (GC/MS) is the preferred confirm atory method. Performed at [facility], [address] [place], [place] [address]   Pregnancy, urine     Status: None   Collection Time: 05/07/23  8:00 AM  Result Value Ref Range   Preg Test, Ur NEGATIVE NEGATIVE    Comment: Performed at [facility], [address] [place], [place] [address]    Blood Alcohol level:  Lab Results  Component Value Date   ETH 190 (H) 05/07/2023   ETH <10 07/09/2022    Metabolic Disorder Labs:  Lab Results  Component Value Date   HGBA1C 5.4 07/21/2020   MPG 108 07/21/2020   MPG 111.15 06/05/2020   No results found for: "PROLACTIN" Lab Results  Component Value Date   CHOL 218 (H) 01/02/2018   TRIG 187 (H) 01/02/2018   HDL 81 01/02/2018   CHOLHDL 2.7 01/02/2018   VLDL 37 01/02/2018   LDLCALC 100 (H) 01/02/2018   LDLCALC 82 01/12/2016     Current Medications: Current Facility-Administered Medications  Medication Dose Route Frequency Provider Last Rate Last Admin   acetaminophen (TYLENOL) tablet 650 mg  650 mg Oral Q6H PRN Bennett, Christal H, NP       alum & mag hydroxide-simeth (MAALOX/MYLANTA) 200-200-20 MG/5ML suspension 30 mL  30 mL Oral Q4H PRN Bennett, Christal H, NP       diphenhydrAMINE (BENADRYL) capsule 50 mg  50 mg Oral TID PRN Bennett, Christal H, NP       Or   diphenhydrAMINE (BENADRYL) injection 50 mg  50 mg Intramuscular TID PRN Bennett, Christal H, NP       fluticasone (FLONASE) 50 MCG/ACT nasal spray 2 spray  2 spray Each Nare Daily PRN Darothy Courtright, Jackquline Denmark, MD       folic acid (FOLVITE) tablet 1 mg  1 mg Oral Daily Bennett, Christal H, NP   1 mg at 05/08/23 [id number]  haloperidol (HALDOL) tablet 5 mg  5 mg Oral TID PRN Bennett, Christal H, NP       Or   haloperidol lactate (HALDOL) injection 5 mg  5 mg Intramuscular TID PRN Bennett, Christal H, NP       hydrOXYzine (ATARAX) tablet 25 mg  25 mg Oral TID PRN Bennett, Christal H, NP       loratadine (CLARITIN) tablet 10 mg  10 mg Oral Daily Iveth Heidemann, Jackquline Denmark, MD   10 mg at 05/08/23 0735   LORazepam (ATIVAN) tablet 2 mg  2 mg Oral TID PRN Bennett, Christal H, NP       Or   LORazepam (ATIVAN) injection 2 mg  2 mg Intramuscular TID PRN Bennett, Christal H, NP       LORazepam (ATIVAN) tablet 0-4 mg  0-4 mg Oral Q6H Bennett, Christal H, NP   1 mg at 05/08/23 1050   Followed by   Melene Muller ON 05/09/2023] LORazepam (ATIVAN) tablet 0-4 mg  0-4 mg Oral Q12H Bennett, Christal H, NP       magnesium hydroxide (MILK OF MAGNESIA) suspension 30 mL  30 mL Oral Daily PRN Bennett, Christal H, NP       multivitamin with minerals tablet 1 tablet  1 tablet Oral Daily Bennett, Christal H, NP   1 tablet at 05/08/23 0735   thiamine (VITAMIN B1) tablet 100 mg  100 mg Oral Daily Bennett, Christal H, NP   100 mg at 05/08/23 0735   traZODone (DESYREL) tablet 100 mg  100 mg Oral QHS PRN Willeen Cass,  Christal H, NP   100 mg at 05/07/23 2036   PTA Medications: Medications Prior to Admission  Medication Sig Dispense Refill Last Dose   acyclovir (ZOVIRAX) 800 MG tablet Take 1 tablet (800 mg total) by mouth 2 (two) times daily. (Patient not taking: Reported on 05/07/2023) 10 tablet 11    albuterol (VENTOLIN HFA) 108 (90 Base) MCG/ACT inhaler Inhale 2 puffs into the lungs every 6 (six) hours as needed for wheezing or shortness of breath. (Patient not taking: Reported on 05/07/2023) 8 g 2    hydrOXYzine (ATARAX) 25 MG tablet Take 1 tablet (25 mg total) by mouth 3 (three) times daily as needed for anxiety. (Patient not taking: Reported on 05/07/2023) 30 tablet 0    predniSONE (DELTASONE) 50 MG tablet Take 1 tablet (50 mg total) by mouth daily with breakfast. (Patient not taking: Reported on 05/07/2023) 4 tablet 0    sertraline (ZOLOFT) 50 MG tablet Take 1 tablet (50 mg total) by mouth daily. (Patient not taking: Reported on 05/07/2023) 30 tablet 1    traZODone (DESYREL) 100 MG tablet Take 1 tablet (100 mg total) by mouth at bedtime as needed for sleep. (Patient not taking: Reported on 05/07/2023) 30 tablet 1     Musculoskeletal: Strength & Muscle Tone: within normal limits Gait & Station: normal Patient leans: N/A            Psychiatric Specialty Exam:  Presentation  General Appearance:  Disheveled  Eye Contact: Fair  Speech: Clear and Coherent  Speech Volume: Normal  Handedness: Right   Mood and Affect  Mood: Anxious; Depressed; Hopeless; Worthless  Affect: Depressed; Tearful   Thought Process  Thought Processes: Coherent  Duration of Psychotic Symptoms:N/A Past Diagnosis of Schizophrenia or Psychoactive disorder: No  Descriptions of Associations:Intact  Orientation:Full (Time, Place and Person)  Thought Content:Logical  Hallucinations:Hallucinations: None  Ideas of Reference:None  Suicidal Thoughts:Suicidal Thoughts: Yes, Active (Endorses SI with  intent and plan to "cut myself.") SI Active Intent and/or Plan: With Intent; With Plan  Homicidal Thoughts:Homicidal Thoughts: No   Sensorium  Memory: Immediate Good; Recent Good  Judgment: Poor  Insight: Poor   Executive Functions  Concentration: Fair  Attention Span: Fair  Recall: Fair  Fund of Knowledge: Fair  Language: Fair   Psychomotor Activity  Psychomotor Activity: Psychomotor Activity: Restlessness   Assets  Assets: Desire for Improvement; Housing; Physical Health; Resilience   Sleep  Sleep: Sleep: Fair    Physical Exam: Physical Exam Vitals and nursing note reviewed.  Constitutional:      Appearance: Normal appearance.  HENT:     Head: Normocephalic and atraumatic.     Mouth/Throat:     Pharynx: Oropharynx is clear.  Eyes:     Pupils: Pupils are equal, round, and reactive to light.  Cardiovascular:     Rate and Rhythm: Normal rate and regular rhythm.  Pulmonary:     Effort: Pulmonary effort is normal.     Breath sounds: Normal breath sounds.  Abdominal:     General: Abdomen is flat.     Palpations: Abdomen is soft.  Musculoskeletal:        General: Normal range of motion.  Skin:    General: Skin is warm and dry.  Neurological:     General: No focal deficit present.     Mental Status: She is alert. Mental status is at baseline.  Psychiatric:        Attention and Perception: She is inattentive.        Mood and Affect: Mood normal. Affect is blunt.        Speech: She is noncommunicative.    Review of Systems  Unable to perform ROS: Other   Blood pressure 98/67, pulse (!) 58, temperature 98.9 F (37.2 C), resp. rate 18, height 4\' 11"  (1.499 m), weight 72.6 kg, last menstrual period 04/09/2023, SpO2 100 %. Body mass index is 32.32 kg/m.  Treatment Plan Summary: Plan patient currently is physically stable.  Vital stable.  Labs unremarkable except for positive drug screen and alcohol level.  Does not appear to be having  severe alcohol withdrawal symptoms.  She will probably continue to sleep through the day and may want to talk tomorrow.  Unclear if it is really necessary to restart medicines that she has not been compliant in the past.  Observation Level/Precautions:  15 minute checks  Laboratory:  UDS  Psychotherapy:    Medications:    Consultations:    Discharge Concerns:    Estimated LOS:  Other:     Physician Treatment Plan for Primary Diagnosis: MDD (major depressive disorder), recurrent episode, severe (HCC) Long Term Goal(s): Improvement in symptoms so as ready for discharge  Short Term Goals: Ability to verbalize feelings will improve  Physician Treatment Plan for Secondary Diagnosis: Principal Problem:   MDD (major depressive disorder), recurrent episode, severe (HCC) Active Problems:   Stimulant use disorder (HCC) (cocaine)   Alcohol use disorder, moderate, dependence (HCC)   Cocaine use disorder, moderate, dependence (HCC)  Long Term Goal(s): Improvement in symptoms so as ready for discharge  Short Term Goals: Ability to disclose and discuss suicidal ideas  I certify that inpatient services furnished can reasonably be expected to improve the patient's condition.    Mordecai Rasmussen, MD 5/14/20243:25 PM

## 2023-05-08 NOTE — Plan of Care (Signed)
Patient stayed in bed most of the shift. Verbalized anxiety and depression. Patient sad and tearful when she talks about her children.Stated that no one is there to help her. Patient denies SI,HI and AVH. Appropriate with staff & peers. Compliant with medications. Appetite and energy level good. Support and encouragement given.

## 2023-05-08 NOTE — Progress Notes (Signed)
   05/08/23 2200  Psych Admission Type (Psych Patients Only)  Admission Status Voluntary  Psychosocial Assessment  Patient Complaints Anxiety;Depression  Eye Contact Brief  Facial Expression Sad  Affect Anxious  Speech Logical/coherent  Interaction Assertive  Motor Activity Slow  Appearance/Hygiene In scrubs  Behavior Characteristics Cooperative  Mood Sad  Thought Process  Coherency WDL  Content Blaming others  Delusions None reported or observed  Perception WDL  Hallucination None reported or observed  Judgment Impaired  Confusion None  Danger to Self  Current suicidal ideation? Denies  Danger to Others  Danger to Others None reported or observed

## 2023-05-08 NOTE — [facility] Suicide Risk Assessment (Signed)
[facility] Admission Suicide Risk Assessment   Nursing information obtained from:  Patient Demographic factors:  Low socioeconomic status, Living alone, Unemployed Current Mental Status:  NA Loss Factors:  Loss of significant relationship, Financial problems / change in socioeconomic status Historical Factors:  Prior suicide attempts, NA Risk Reduction Factors:  NA  Total Time spent with patient: 45 minutes Principal Problem: MDD (major depressive disorder), recurrent episode, severe (HCC) Diagnosis:  Principal Problem:   MDD (major depressive disorder), recurrent episode, severe (HCC) Active Problems:   Stimulant use disorder (HCC) (cocaine)   Alcohol use disorder, moderate, dependence (HCC)   Cocaine use disorder, moderate, dependence (HCC)  Subjective Data: Patient seen and chart reviewed.  37 year old woman with a history of substance abuse and mood symptoms presented to the emergency room requesting hospitalization.  In the emergency room it is documented that she said that she was feeling depressed but did not have immediate acute suicidal intent.  Had relapsed into alcohol and cocaine use.  Patient declined my offer of talking with her today either in her room or in the office.  Said she did not feel like talking.  Continued Clinical Symptoms:  Alcohol Use Disorder Identification Test Final Score (AUDIT): 4 The "Alcohol Use Disorders Identification Test", Guidelines for Use in Primary Care, Second Edition.  World Science writer [facility]). Score between 0-7:  no or low risk or alcohol related problems. Score between 8-15:  moderate risk of alcohol related problems. Score between 16-19:  high risk of alcohol related problems. Score 20 or above:  warrants further diagnostic evaluation for alcohol dependence and treatment.   CLINICAL FACTORS:   Depression:   Comorbid alcohol abuse/dependence Alcohol/Substance Abuse/Dependencies   Musculoskeletal: Strength & Muscle Tone: within normal  limits Gait & Station: normal Patient leans: N/A  Psychiatric Specialty Exam:  Presentation  General Appearance:  Disheveled  Eye Contact: Fair  Speech: Clear and Coherent  Speech Volume: Normal  Handedness: Right   Mood and Affect  Mood: Anxious; Depressed; Hopeless; Worthless  Affect: Depressed; Tearful   Thought Process  Thought Processes: Coherent  Descriptions of Associations:Intact  Orientation:Full (Time, Place and Person)  Thought Content:Logical  History of Schizophrenia/Schizoaffective disorder:No  Duration of Psychotic Symptoms:No data recorded Hallucinations:Hallucinations: None  Ideas of Reference:None  Suicidal Thoughts:Suicidal Thoughts: Yes, Active (Endorses SI with intent and plan to "cut myself.") SI Active Intent and/or Plan: With Intent; With Plan  Homicidal Thoughts:Homicidal Thoughts: No   Sensorium  Memory: Immediate Good; Recent Good  Judgment: Poor  Insight: Poor   Executive Functions  Concentration: Fair  Attention Span: Fair  Recall: Fair  Fund of Knowledge: Fair  Language: Fair   Psychomotor Activity  Psychomotor Activity: Psychomotor Activity: Restlessness   Assets  Assets: Desire for Improvement; Housing; Physical Health; Resilience   Sleep  Sleep: Sleep: Fair    Physical Exam: Physical Exam Constitutional:      Appearance: Normal appearance.  HENT:     Head: Normocephalic and atraumatic.     Mouth/Throat:     Pharynx: Oropharynx is clear.  Eyes:     Pupils: Pupils are equal, round, and reactive to light.  Cardiovascular:     Rate and Rhythm: Normal rate and regular rhythm.  Pulmonary:     Effort: Pulmonary effort is normal.     Breath sounds: Normal breath sounds.  Abdominal:     General: Abdomen is flat.     Palpations: Abdomen is soft.  Musculoskeletal:        General: Normal range  of motion.  Skin:    General: Skin is warm and dry.  Neurological:     General: No  focal deficit present.     Mental Status: She is alert. Mental status is at baseline.  Psychiatric:        Attention and Perception: She is inattentive.        Mood and Affect: Affect is blunt.        Speech: She is noncommunicative.    Review of Systems  Unable to perform ROS: Other   Blood pressure 98/67, pulse (!) 58, temperature 98.9 F (37.2 C), resp. rate 18, height 4\' 11"  (1.499 m), weight 72.6 kg, last menstrual period 04/09/2023, SpO2 100 %. Body mass index is 32.32 kg/m.   COGNITIVE FEATURES THAT CONTRIBUTE TO RISK:  Closed-mindedness    SUICIDE RISK:   Minimal: No identifiable suicidal ideation.  Patients presenting with no risk factors but with morbid ruminations; may be classified as minimal risk based on the severity of the depressive symptoms  PLAN OF CARE: Continue 15-minute checks.  After speaking with patient we can consider whether medicine may be helpful.  Engage in individual and group treatment.  Ongoing assessment of dangerousness prior to discharge  I certify that inpatient services furnished can reasonably be expected to improve the patient's condition.   Mordecai Rasmussen, MD 05/08/2023, 3:23 PM

## 2023-05-08 NOTE — [facility] Counselor (Signed)
CSW attempted to complete PSA with patient, patient declined to complete PSA stating "I don't want to".   Conversation terminated without incident.  CSW team will attempt at later date/time.  Penni Homans, MSW, LCSW 05/08/2023 9:15 AM

## 2023-05-09 DIAGNOSIS — F332 Major depressive disorder, recurrent severe without psychotic features: Principal | ICD-10-CM

## 2023-05-09 MED ORDER — SERTRALINE HCL 25 MG PO TABS
50.0000 mg | ORAL_TABLET | Freq: Every day | ORAL | Status: DC
  Administered 2023-05-09 – 2023-05-15 (×7): 50 mg via ORAL
  Filled 2023-05-09 (×7): qty 2

## 2023-05-09 NOTE — Progress Notes (Signed)
[facility] LCSW Progress Note   05/09/2023    2:46 PM  [name]   [id number]   Type of Contact and Topic:   PSA Attempt   CSW attempted to complete PSA with patient who denied to complete PSA with patient in private. CSW to attempt PSA with patient at a later time.    Signed:  Corky Crafts, MSW, LCSW, LCAS 05/09/2023 2:46 PM

## 2023-05-09 NOTE — [facility] Group Notes (Signed)
[facility] Group Notes:  (Nursing/MHT/Case Management/Adjunct)  Date:  05/09/2023  Time:  12:40 AM  Type of Therapy:  Group Therapy  Participation Level:  None  Participation Quality:  Intrusive and Monopolizing  Affect:  Angry, Defensive, and Irritable  Cognitive:  Lacking  Insight:  Limited  Engagement in Group:  Defensive, Lacking, Monopolizing, Off Topic, and Resistant  Modes of Intervention:  Discussion  Summary of Progress/Problems: Patient entered dayroom and immediately started talking about other patients and how she did not need to be there and/or around the other patients. MHT asked pt to please excuse herself from the group if she could not be apart of the group setting. Pt went back to her room.   After group, pt returned and had a few words in the hallway about "why is everyone standing around me in the hallway!" MHT explained that no one was intentionally standing near or around her and that everyone has the same mission during snack time. MHT explained that giving every other patient grace is the key to success in the unit.  Patient then showed some emotions through tearful expression and MHT let her know that it was ok to feel these feelings and that this season of life would pass. Pt ate her snack and proceed to her room. Pt acknowledged her actions were out of line during group.   Maglione,Sonjia Wilcoxson E 05/09/2023, 12:40 AM

## 2023-05-09 NOTE — Progress Notes (Signed)
Patient quiet tearing during lunch , stating that her corn was touching her spaghetti. Calmed down after and went to her room. Was in her room most of the day and only left for meals. CIWA this shift was 1 with no signs of anxiety or withdrawal. Tolerated all medication this shift.

## 2023-05-09 NOTE — Group Note (Signed)
Recreation Therapy Group Note   Group Topic:Relaxation  Group Date: 05/09/2023 Start Time: 1000 End Time: 1045 Facilitators: Rosina Lowenstein, LRT, CTRS Location:  Craft Room  Group Description: PMR (Progressive Muscle Relaxation). LRT asks patients their current level of stress/anxiety from 1-10, with 10 being the highest. LRT educates patients on what PMR is and the benefits that come from it. Patients are asked to sit with their feet flat on the floor while sitting up and all the way back in their chair, if possible. LRT and pts follow a prompt through a speaker that requires you to tense and release different muscles in their body and focus on their breathing. During session, lights are off and soft music is being played. At the end of the prompt, LRT asks patients to rank their current levels of stress/anxiety from 1-10, 10 being the highest.   Goal Area(s) Addressed:  Patients will be able to describe progressive muscle relaxation.  Patient will practice using relaxation technique. Patient will identify a new coping skill.  Patient will follow multistep directions to reduce anxiety and stress.  Affect/Mood: N/A   Participation Level: Did not attend    Clinical Observations/Individualized Feedback: [name] did not attend group due to resting in her room.  Plan: Continue to engage patient in RT group sessions 2-3x/week.   Rosina Lowenstein, LRT, CTRS 05/09/2023 10:54 AM

## 2023-05-09 NOTE — Group Note (Signed)
[facility] LCSW Group Therapy Note   Group Date: 05/09/2023 Start Time: 1300 End Time: 1400   Type of Therapy/Topic:  Group Therapy:  Emotion Regulation  Participation Level:  Did Not Attend   Mood:  Description of Group:    The purpose of this group is to assist patients in learning to regulate negative emotions and experience positive emotions. Patients will be guided to discuss ways in which they have been vulnerable to their negative emotions. These vulnerabilities will be juxtaposed with experiences of positive emotions or situations, and patients challenged to use positive emotions to combat negative ones. Special emphasis will be placed on coping with negative emotions in conflict situations, and patients will process healthy conflict resolution skills.  Therapeutic Goals: Patient will identify two positive emotions or experiences to reflect on in order to balance out negative emotions:  Patient will label two or more emotions that they find the most difficult to experience:  Patient will be able to demonstrate positive conflict resolution skills through discussion or role plays:   Summary of Patient Progress:   Patient did not attend group despite encouraged participation.      Therapeutic Modalities:   Cognitive Behavioral Therapy Feelings Identification Dialectical Behavioral Therapy   Avriel Kandel W Nathania Waldman, LCSWA 

## 2023-05-09 NOTE — Progress Notes (Signed)
[facility] MD Progress Note  05/09/2023 1:22 PM [name]  MRN:  [id number] Subjective: Follow-up for this 37 year old woman with a history of depression and substance abuse.  Once again the patient is declining to get out of bed today.  I went to speak with her in her room.  She made only brief eye contact and answered questions again only slightly.  Told me that she had been feeling very bad recently.  Admitted that she had been back to using cocaine and alcohol.  Said she had been eating and drinking this morning but had not been taking care of herself well at home.  Had not been taking medicine or engage in any outpatient treatment.  Currently just says she wants to sleep.  Denies active suicidal intent. Principal Problem: MDD (major depressive disorder), recurrent episode, severe (HCC) Diagnosis: Principal Problem:   MDD (major depressive disorder), recurrent episode, severe (HCC) Active Problems:   Stimulant use disorder (HCC) (cocaine)   Alcohol use disorder, moderate, dependence (HCC)   Cocaine use disorder, moderate, dependence (HCC)  Total Time spent with patient: 20 minutes  Past Psychiatric History: Patient has a history of recurrent episodes of mood symptoms usually in the context of relapse into substance abuse most often alcohol and cocaine  Past Medical History:  Past Medical History:  Diagnosis Date   Anxiety    Depression    Gestational diabetes    History of substance abuse (HCC)    History of suicide attempt    History of thyroid disease    Hypertension    Thyroid disease     Past Surgical History:  Procedure Laterality Date   CESAREAN SECTION  2000, 2002, 2013   WISDOM TOOTH EXTRACTION     Family History:  Family History  Problem Relation Age of Onset   Diabetes Mother    Family Psychiatric  History: See previous Social History:  Social History   Substance and Sexual Activity  Alcohol Use Yes   Comment: ocassionlly     Social History   Substance  and Sexual Activity  Drug Use Not Currently   Types: Cocaine, Marijuana   Comment: Positive UDS 12/2017, last used mj 3 days ago , last used cocaine  a couple of months ago     Social History   Socioeconomic History   Marital status: Single    Spouse name: Not on file   Number of children: Not on file   Years of education: Not on file   Highest education level: Not on file  Occupational History   Not on file  Tobacco Use   Smoking status: Former    Packs/day: .25    Types: Cigarettes   Smokeless tobacco: Never  Vaping Use   Vaping Use: Never used  Substance and Sexual Activity   Alcohol use: Yes    Comment: ocassionlly   Drug use: Not Currently    Types: Cocaine, Marijuana    Comment: Positive UDS 12/2017, last used mj 3 days ago , last used cocaine  a couple of months ago    Sexual activity: Yes    Birth control/protection: None  Other Topics Concern   Not on file  Social History Narrative   Not on file   Social Determinants of Health   Financial Resource Strain: Not on file  Food Insecurity: Food Insecurity Present (05/07/2023)   Hunger Vital Sign    Worried About Running Out of Food in the Last Year: Often true    Ran  Out of Food in the Last Year: Often true  Transportation Needs: Unmet Transportation Needs (05/07/2023)   PRAPARE - Administrator, Civil Service (Medical): Yes    Lack of Transportation (Non-Medical): Yes  Physical Activity: Not on file  Stress: Not on file  Social Connections: Not on file   Additional Social History:                         Sleep: Fair  Appetite:  Fair  Current Medications: Current Facility-Administered Medications  Medication Dose Route Frequency Provider Last Rate Last Admin   acetaminophen (TYLENOL) tablet 650 mg  650 mg Oral Q6H PRN Willeen Cass, Christal H, NP   650 mg at 05/08/23 2102   alum & mag hydroxide-simeth (MAALOX/MYLANTA) 200-200-20 MG/5ML suspension 30 mL  30 mL Oral Q4H PRN Bennett, Christal  H, NP       diphenhydrAMINE (BENADRYL) capsule 50 mg  50 mg Oral TID PRN Willeen Cass, Christal H, NP   50 mg at 05/08/23 2028   Or   diphenhydrAMINE (BENADRYL) injection 50 mg  50 mg Intramuscular TID PRN Bennett, Christal H, NP       fluticasone (FLONASE) 50 MCG/ACT nasal spray 2 spray  2 spray Each Nare Daily PRN Wylder Macomber, Jackquline Denmark, MD       folic acid (FOLVITE) tablet 1 mg  1 mg Oral Daily Bennett, Christal H, NP   1 mg at 05/09/23 0837   haloperidol (HALDOL) tablet 5 mg  5 mg Oral TID PRN Thurston Hole H, NP   5 mg at 05/08/23 2028   Or   haloperidol lactate (HALDOL) injection 5 mg  5 mg Intramuscular TID PRN Bennett, Christal H, NP       hydrOXYzine (ATARAX) tablet 25 mg  25 mg Oral TID PRN Bennett, Christal H, NP       loratadine (CLARITIN) tablet 10 mg  10 mg Oral Daily Cabot Cromartie T, MD   10 mg at 05/09/23 1610   LORazepam (ATIVAN) tablet 2 mg  2 mg Oral TID PRN Thurston Hole H, NP   2 mg at 05/08/23 2028   Or   LORazepam (ATIVAN) injection 2 mg  2 mg Intramuscular TID PRN Bennett, Christal H, NP       LORazepam (ATIVAN) tablet 0-4 mg  0-4 mg Oral Q12H Bennett, Christal H, NP       magnesium hydroxide (MILK OF MAGNESIA) suspension 30 mL  30 mL Oral Daily PRN Bennett, Christal H, NP       multivitamin with minerals tablet 1 tablet  1 tablet Oral Daily Bennett, Christal H, NP   1 tablet at 05/09/23 0837   thiamine (VITAMIN B1) tablet 100 mg  100 mg Oral Daily Bennett, Christal H, NP   100 mg at 05/09/23 0837   traZODone (DESYREL) tablet 100 mg  100 mg Oral QHS PRN Willeen Cass, Christal H, NP   100 mg at 05/07/23 2036    Lab Results: No results found for this or any previous visit (from the past 48 hour(s)).  Blood Alcohol level:  Lab Results  Component Value Date   ETH 190 (H) 05/07/2023   ETH <10 07/09/2022    Metabolic Disorder Labs: Lab Results  Component Value Date   HGBA1C 5.4 07/21/2020   MPG 108 07/21/2020   MPG 111.15 06/05/2020   No results found for:  "PROLACTIN" Lab Results  Component Value Date   CHOL 218 (H) 01/02/2018  TRIG 187 (H) 01/02/2018   HDL 81 01/02/2018   CHOLHDL 2.7 01/02/2018   VLDL 37 01/02/2018   LDLCALC 100 (H) 01/02/2018   LDLCALC 82 01/12/2016    Physical Findings: AIMS:  , ,  ,  ,    CIWA:  CIWA-Ar Total: 1 COWS:     Musculoskeletal: Strength & Muscle Tone: within normal limits Gait & Station: normal Patient leans: N/A  Psychiatric Specialty Exam:  Presentation  General Appearance:  Disheveled  Eye Contact: Fair  Speech: Clear and Coherent  Speech Volume: Normal  Handedness: Right   Mood and Affect  Mood: Anxious; Depressed; Hopeless; Worthless  Affect: Depressed; Tearful   Thought Process  Thought Processes: Coherent  Descriptions of Associations:Intact  Orientation:Full (Time, Place and Person)  Thought Content:Logical  History of Schizophrenia/Schizoaffective disorder:No  Duration of Psychotic Symptoms:No data recorded Hallucinations:No data recorded Ideas of Reference:None  Suicidal Thoughts:No data recorded Homicidal Thoughts:No data recorded  Sensorium  Memory: Immediate Good; Recent Good  Judgment: Poor  Insight: Poor   Executive Functions  Concentration: Fair  Attention Span: Fair  Recall: Fair  Fund of Knowledge: Fair  Language: Fair   Psychomotor Activity  Psychomotor Activity:No data recorded  Assets  Assets: Desire for Improvement; Housing; Physical Health; Resilience   Sleep  Sleep:No data recorded   Physical Exam: Physical Exam Vitals and nursing note reviewed.  Constitutional:      Appearance: Normal appearance.  HENT:     Head: Normocephalic and atraumatic.     Mouth/Throat:     Pharynx: Oropharynx is clear.  Eyes:     Pupils: Pupils are equal, round, and reactive to light.  Cardiovascular:     Rate and Rhythm: Normal rate and regular rhythm.  Pulmonary:     Effort: Pulmonary effort is normal.     Breath  sounds: Normal breath sounds.  Abdominal:     General: Abdomen is flat.     Palpations: Abdomen is soft.  Musculoskeletal:        General: Normal range of motion.  Skin:    General: Skin is warm and dry.  Neurological:     General: No focal deficit present.     Mental Status: She is alert. Mental status is at baseline.  Psychiatric:        Attention and Perception: She is inattentive.        Mood and Affect: Mood normal. Affect is blunt.        Speech: Speech is delayed.        Behavior: Behavior is uncooperative.        Thought Content: Thought content normal.        Cognition and Memory: Cognition is impaired.    Review of Systems  Constitutional: Negative.   HENT: Negative.    Eyes: Negative.   Respiratory: Negative.    Cardiovascular: Negative.   Gastrointestinal: Negative.   Musculoskeletal: Negative.   Skin: Negative.   Neurological: Negative.   Psychiatric/Behavioral:  Positive for depression and substance abuse. Negative for suicidal ideas. The patient is nervous/anxious.    Blood pressure 132/64, pulse 70, temperature 98 F (36.7 C), temperature source Oral, resp. rate 16, height 4\' 11"  (1.499 m), weight 72.6 kg, last menstrual period 04/09/2023, SpO2 100 %. Body mass index is 32.32 kg/m.   Treatment Plan Summary: Medication management and Plan expressed empathy for the patient and her condition of feeling so bad.  Reminded her that she should be eating and drinking to keep her strength up  and make sure she does not get dehydrated.  Encouraged her when she felt up to it to get up and take care of herself talk to staff and attend groups.  I offered to restart medication for depression if she felt it would be helpful and she said she would like me to do that.  Restarting Zoloft.  Mordecai Rasmussen, MD 05/09/2023, 1:22 PM

## 2023-05-09 NOTE — BH IP Treatment Plan (Signed)
Interdisciplinary Treatment and Diagnostic Plan Update  05/09/2023 Time of Session: 0830 Nabila Reida Buesing MRN: [id number]  Principal Diagnosis: MDD (major depressive disorder), recurrent episode, severe (HCC)  Secondary Diagnoses: Principal Problem:   MDD (major depressive disorder), recurrent episode, severe (HCC) Active Problems:   Stimulant use disorder (HCC) (cocaine)   Alcohol use disorder, moderate, dependence (HCC)   Cocaine use disorder, moderate, dependence (HCC)   Current Medications:  Current Facility-Administered Medications  Medication Dose Route Frequency Provider Last Rate Last Admin   acetaminophen (TYLENOL) tablet 650 mg  650 mg Oral Q6H PRN Willeen Cass, Christal H, NP   650 mg at 05/08/23 2102   alum & mag hydroxide-simeth (MAALOX/MYLANTA) 200-200-20 MG/5ML suspension 30 mL  30 mL Oral Q4H PRN Bennett, Christal H, NP       diphenhydrAMINE (BENADRYL) capsule 50 mg  50 mg Oral TID PRN Willeen Cass, Christal H, NP   50 mg at 05/08/23 2028   Or   diphenhydrAMINE (BENADRYL) injection 50 mg  50 mg Intramuscular TID PRN Bennett, Christal H, NP       fluticasone (FLONASE) 50 MCG/ACT nasal spray 2 spray  2 spray Each Nare Daily PRN Clapacs, Jackquline Denmark, MD       folic acid (FOLVITE) tablet 1 mg  1 mg Oral Daily Bennett, Christal H, NP   1 mg at 05/09/23 0837   haloperidol (HALDOL) tablet 5 mg  5 mg Oral TID PRN Thurston Hole H, NP   5 mg at 05/08/23 2028   Or   haloperidol lactate (HALDOL) injection 5 mg  5 mg Intramuscular TID PRN Bennett, Christal H, NP       hydrOXYzine (ATARAX) tablet 25 mg  25 mg Oral TID PRN Bennett, Christal H, NP       loratadine (CLARITIN) tablet 10 mg  10 mg Oral Daily Clapacs, John T, MD   10 mg at 05/09/23 4098   LORazepam (ATIVAN) tablet 2 mg  2 mg Oral TID PRN Thurston Hole H, NP   2 mg at 05/08/23 2028   Or   LORazepam (ATIVAN) injection 2 mg  2 mg Intramuscular TID PRN Bennett, Christal H, NP       LORazepam (ATIVAN) tablet 0-4 mg  0-4 mg Oral  Q12H Bennett, Christal H, NP       magnesium hydroxide (MILK OF MAGNESIA) suspension 30 mL  30 mL Oral Daily PRN Bennett, Christal H, NP       multivitamin with minerals tablet 1 tablet  1 tablet Oral Daily Bennett, Christal H, NP   1 tablet at 05/09/23 0837   sertraline (ZOLOFT) tablet 50 mg  50 mg Oral Daily Clapacs, John T, MD       thiamine (VITAMIN B1) tablet 100 mg  100 mg Oral Daily Bennett, Christal H, NP   100 mg at 05/09/23 0837   traZODone (DESYREL) tablet 100 mg  100 mg Oral QHS PRN Willeen Cass, Christal H, NP   100 mg at 05/07/23 2036   PTA Medications: Medications Prior to Admission  Medication Sig Dispense Refill Last Dose   acyclovir (ZOVIRAX) 800 MG tablet Take 1 tablet (800 mg total) by mouth 2 (two) times daily. (Patient not taking: Reported on 05/07/2023) 10 tablet 11    albuterol (VENTOLIN HFA) 108 (90 Base) MCG/ACT inhaler Inhale 2 puffs into the lungs every 6 (six) hours as needed for wheezing or shortness of breath. (Patient not taking: Reported on 05/07/2023) 8 g 2    hydrOXYzine (ATARAX) 25 MG tablet  Take 1 tablet (25 mg total) by mouth 3 (three) times daily as needed for anxiety. (Patient not taking: Reported on 05/07/2023) 30 tablet 0    predniSONE (DELTASONE) 50 MG tablet Take 1 tablet (50 mg total) by mouth daily with breakfast. (Patient not taking: Reported on 05/07/2023) 4 tablet 0    sertraline (ZOLOFT) 50 MG tablet Take 1 tablet (50 mg total) by mouth daily. (Patient not taking: Reported on 05/07/2023) 30 tablet 1    traZODone (DESYREL) 100 MG tablet Take 1 tablet (100 mg total) by mouth at bedtime as needed for sleep. (Patient not taking: Reported on 05/07/2023) 30 tablet 1     Patient Stressors: Loss of mother   Marital or family conflict   Substance abuse    Patient Strengths: Ability for insight  Active sense of humor  Average or above average intelligence  Capable of independent living  Communication skills  General fund of knowledge   Treatment Modalities:  Medication Management, Group therapy, Case management,  1 to 1 session with clinician, Psychoeducation, Recreational therapy.   Physician Treatment Plan for Primary Diagnosis: MDD (major depressive disorder), recurrent episode, severe (HCC) Long Term Goal(s): Improvement in symptoms so as ready for discharge   Short Term Goals: Ability to disclose and discuss suicidal ideas Ability to verbalize feelings will improve  Medication Management: Evaluate patient's response, side effects, and tolerance of medication regimen.  Therapeutic Interventions: 1 to 1 sessions, Unit Group sessions and Medication administration.  Evaluation of Outcomes: Progressing  Physician Treatment Plan for Secondary Diagnosis: Principal Problem:   MDD (major depressive disorder), recurrent episode, severe (HCC) Active Problems:   Stimulant use disorder (HCC) (cocaine)   Alcohol use disorder, moderate, dependence (HCC)   Cocaine use disorder, moderate, dependence (HCC)  Long Term Goal(s): Improvement in symptoms so as ready for discharge   Short Term Goals: Ability to disclose and discuss suicidal ideas Ability to verbalize feelings will improve     Medication Management: Evaluate patient's response, side effects, and tolerance of medication regimen.  Therapeutic Interventions: 1 to 1 sessions, Unit Group sessions and Medication administration.  Evaluation of Outcomes: Progressing   RN Treatment Plan for Primary Diagnosis: MDD (major depressive disorder), recurrent episode, severe (HCC) Long Term Goal(s): Knowledge of disease and therapeutic regimen to maintain health will improve  Short Term Goals: Ability to remain free from injury will improve, Ability to verbalize frustration and anger appropriately will improve, Ability to demonstrate self-control, Ability to participate in decision making will improve, Ability to verbalize feelings will improve, Ability to disclose and discuss suicidal ideas, Ability  to identify and develop effective coping behaviors will improve, and Compliance with prescribed medications will improve  Medication Management: RN will administer medications as ordered by provider, will assess and evaluate patient's response and provide education to patient for prescribed medication. RN will report any adverse and/or side effects to prescribing provider.  Therapeutic Interventions: 1 on 1 counseling sessions, Psychoeducation, Medication administration, Evaluate responses to treatment, Monitor vital signs and CBGs as ordered, Perform/monitor CIWA, COWS, AIMS and Fall Risk screenings as ordered, Perform wound care treatments as ordered.  Evaluation of Outcomes: Progressing   LCSW Treatment Plan for Primary Diagnosis: MDD (major depressive disorder), recurrent episode, severe (HCC) Long Term Goal(s): Safe transition to appropriate next level of care at discharge, Engage patient in therapeutic group addressing interpersonal concerns.  Short Term Goals: Engage patient in aftercare planning with referrals and resources, Increase social support, Increase ability to appropriately verbalize feelings, Increase emotional  regulation, Facilitate acceptance of mental health diagnosis and concerns, Facilitate patient progression through stages of change regarding substance use diagnoses and concerns, Identify triggers associated with mental health/substance abuse issues, and Increase skills for wellness and recovery  Therapeutic Interventions: Assess for all discharge needs, 1 to 1 time with Social worker, Explore available resources and support systems, Assess for adequacy in community support network, Educate family and significant other(s) on suicide prevention, Complete Psychosocial Assessment, Interpersonal group therapy.  Evaluation of Outcomes: Progressing   Progress in Treatment: Attending groups: No. Participating in groups: No. Taking medication as prescribed: Yes. Toleration  medication: Yes. Family/Significant other contact made: No, will contact:  CSW  to obtain consent  Patient understands diagnosis: No. Discussing patient identified problems/goals with staff: Yes. Medical problems stabilized or resolved: Yes. Denies suicidal/homicidal ideation: No. Issues/concerns per patient self-inventory: Yes. Other: none  New problem(s) identified: No, Describe:  none  New Short Term/Long Term Goal(s): Patient to work towards detox, elimination of symptoms of psychosis, medication management for mood stabilization; elimination of SI thoughts; development of comprehensive mental wellness/sobriety plan.  Patient Goals: patient refused to participate in treatment team meeting    Discharge Plan or Barriers: No psychosocial barriers identified at this time, patient to return to place of residence when appropriate for discharge.   Reason for Continuation of Hospitalization: Depression Medication stabilization  Estimated Length of Stay: 1-7 days   Scribe for Treatment Team: Almedia Balls 05/09/2023 3:05 PM

## 2023-05-09 NOTE — Group Note (Signed)
Date:  05/09/2023 Time:  6:15 PM  Group Topic/Focus:  Outdoor Recreation    Participation Level:  Did Not Attend   Lynelle Smoke [facility] 05/09/2023, 6:15 PM

## 2023-05-10 DIAGNOSIS — F332 Major depressive disorder, recurrent severe without psychotic features: Secondary | ICD-10-CM | POA: Diagnosis not present

## 2023-05-10 MED ORDER — DOCUSATE SODIUM 100 MG PO CAPS
100.0000 mg | ORAL_CAPSULE | Freq: Two times a day (BID) | ORAL | Status: DC
  Administered 2023-05-10 – 2023-05-15 (×6): 100 mg via ORAL
  Filled 2023-05-10 (×7): qty 1

## 2023-05-10 MED ORDER — HYDROXYZINE HCL 50 MG PO TABS
50.0000 mg | ORAL_TABLET | Freq: Four times a day (QID) | ORAL | Status: DC | PRN
  Administered 2023-05-10 – 2023-05-15 (×10): 50 mg via ORAL
  Filled 2023-05-10 (×11): qty 1

## 2023-05-10 MED ORDER — TEMAZEPAM 15 MG PO CAPS
15.0000 mg | ORAL_CAPSULE | Freq: Once | ORAL | Status: AC
  Administered 2023-05-10: 15 mg via ORAL
  Filled 2023-05-10: qty 1

## 2023-05-10 NOTE — Progress Notes (Signed)
Patient presents pleasant and cooperative. Noted in dayroom with peers. Minimal interaction. Denies SI, Hi, AVH. Endorses depression. No complaints of concerns voiced. Reports headache feels better at this time. Encouragement and support provided. Safety checks maintained. Medications given as prescribed. Pt receptive and remains safe on unit with q 15 min checks.

## 2023-05-10 NOTE — Progress Notes (Signed)
Patient tearful in bed, states cannot sleep, and is requesting something for sleep. Prn was given earlier but not effective. Patient reports having flashbacks of seeing her mom dead on the floor. Writer reached out to providers for medication. Patient given juice and allowed to watch tv until able to get more meds.

## 2023-05-10 NOTE — Plan of Care (Signed)
D- Patient alert and oriented. Patient presented in a pleasant mood on assessment stating that she slept "ok" last night and had complaints of a headache. Patient rated her pain a "6/10", in which she did request PRN pain medication to help with relief. Patient denies anxiety, but endorsed "a little bit" of depression, stating that it's still because of what she's been dealing with outside. Patient also denies SI, HI, AVH. Patient had no stated goals for today.  A- Scheduled medications administered to patient, per MD orders. Support and encouragement provided.  Routine safety checks conducted every 15 minutes.  Patient informed to notify staff with problems or concerns.  R- No adverse drug reactions noted. Patient contracts for safety at this time. Patient compliant with medications and treatment plan. Patient receptive, calm, and cooperative. Patient interacts well with others on the unit. Patient remains safe at this time.  Problem: Education: Goal: Knowledge of General Education information will improve Description: Including pain rating scale, medication(s)/side effects and non-pharmacologic comfort measures Outcome: Progressing   Problem: Health Behavior/Discharge Planning: Goal: Ability to manage health-related needs will improve Outcome: Progressing   Problem: Clinical Measurements: Goal: Ability to maintain clinical measurements within normal limits will improve Outcome: Progressing Goal: Will remain free from infection Outcome: Progressing Goal: Diagnostic test results will improve Outcome: Progressing Goal: Respiratory complications will improve Outcome: Progressing Goal: Cardiovascular complication will be avoided Outcome: Progressing   Problem: Activity: Goal: Risk for activity intolerance will decrease Outcome: Progressing   Problem: Nutrition: Goal: Adequate nutrition will be maintained Outcome: Progressing   Problem: Coping: Goal: Level of anxiety will  decrease Outcome: Progressing   Problem: Elimination: Goal: Will not experience complications related to bowel motility Outcome: Progressing Goal: Will not experience complications related to urinary retention Outcome: Progressing   Problem: Pain Managment: Goal: General experience of comfort will improve Outcome: Progressing   Problem: Safety: Goal: Ability to remain free from injury will improve Outcome: Progressing   Problem: Skin Integrity: Goal: Risk for impaired skin integrity will decrease Outcome: Progressing   Problem: Education: Goal: Utilization of techniques to improve thought processes will improve Outcome: Progressing Goal: Knowledge of the prescribed therapeutic regimen will improve Outcome: Progressing   Problem: Activity: Goal: Interest or engagement in leisure activities will improve Outcome: Progressing Goal: Imbalance in normal sleep/wake cycle will improve Outcome: Progressing   Problem: Coping: Goal: Coping ability will improve Outcome: Progressing Goal: Will verbalize feelings Outcome: Progressing   Problem: Health Behavior/Discharge Planning: Goal: Ability to make decisions will improve Outcome: Progressing Goal: Compliance with therapeutic regimen will improve Outcome: Progressing   Problem: Role Relationship: Goal: Will demonstrate positive changes in social behaviors and relationships Outcome: Progressing   Problem: Safety: Goal: Ability to disclose and discuss suicidal ideas will improve Outcome: Progressing Goal: Ability to identify and utilize support systems that promote safety will improve Outcome: Progressing   Problem: Self-Concept: Goal: Will verbalize positive feelings about self Outcome: Progressing Goal: Level of anxiety will decrease Outcome: Progressing   Problem: Education: Goal: Knowledge of disease or condition will improve Outcome: Progressing Goal: Understanding of discharge needs will improve Outcome:  Progressing   Problem: Health Behavior/Discharge Planning: Goal: Ability to identify changes in lifestyle to reduce recurrence of condition will improve Outcome: Progressing Goal: Identification of resources available to assist in meeting health care needs will improve Outcome: Progressing   Problem: Physical Regulation: Goal: Complications related to the disease process, condition or treatment will be avoided or minimized Outcome: Progressing   Problem: Safety: Goal: Ability  to remain free from injury will improve Outcome: Progressing   Problem: Education: Goal: Knowledge of [facility] Education information/materials will improve Outcome: Progressing Goal: Emotional status will improve Outcome: Progressing Goal: Mental status will improve Outcome: Progressing Goal: Verbalization of understanding the information provided will improve Outcome: Progressing   Problem: Safety: Goal: Periods of time without injury will increase Outcome: Progressing

## 2023-05-10 NOTE — Progress Notes (Signed)
Patient alert / oriented x 4.  She is pleasant & sociable.  She denied SI, HI, delusions, or hallucinations.  She ambulated frequently to the nurses station,  She was given PRN medications for the following:  Atarax @ 8:45p for anxiety; Acetaminophen @ 8:35p for c/o pain 3/10 in her back.; and Trazodone @ 9:40p got c/o "I can't sleep".  No s/sx. of adverse reactions or acute distress.  She requested toiletry items so that she could take a shower.  She didn't change clothes because "they washed them here yesterday".  Patient complained to this writer that another female on the unit "don't like me and I don't know why".  She would like for her to "stay away from me".  This Clinical research associate explained that she is safe on the unit, and staff will monitor closely when the two are in the same area.  She rested in bed with closed eyes after taking the Trazodone.  Respiration even & unlabored.  Plan of Care is active, and Q75m safety checks are continued.     Anxiety

## 2023-05-10 NOTE — Group Note (Signed)
[facility] LCSW Group Therapy Note   Group Date: 05/10/2023 Start Time: 1320 End Time: 1400   Type of Therapy/Topic:  Group Therapy:  Balance in Life  Participation Level:  Did Not Attend   Description of Group:    This group will address the concept of balance and how it feels and looks when one is unbalanced. Patients will be encouraged to process areas in their lives that are out of balance, and identify reasons for remaining unbalanced. Facilitators will guide patients utilizing problem- solving interventions to address and correct the stressor making their life unbalanced. Understanding and applying boundaries will be explored and addressed for obtaining  and maintaining a balanced life. Patients will be encouraged to explore ways to assertively make their unbalanced needs known to significant others in their lives, using other group members and facilitator for support and feedback.  Therapeutic Goals: Patient will identify two or more emotions or situations they have that consume much of in their lives. Patient will identify signs/triggers that life has become out of balance:  Patient will identify two ways to set boundaries in order to achieve balance in their lives:  Patient will demonstrate ability to communicate their needs through discussion and/or role plays  Summary of Patient Progress: X   Therapeutic Modalities:   Cognitive Behavioral Therapy Solution-Focused Therapy Assertiveness Training   Alayza Pieper R Onyinyechi Huante, LCSW 

## 2023-05-10 NOTE — Progress Notes (Signed)
[facility] MD Progress Note  05/10/2023 10:48 AM [name]  MRN:  [id number] Subjective: Follow-up patient with depression and substance abuse.  Today and 10:45 patient is once again asleep.  With effort I got her to open her eyes briefly and answer a couple of questions with single words but could not arouse her enough to get out of bed.  Appears to be over sedated.  No dangerous behavior.  Vital stable and normal. Principal Problem: MDD (major depressive disorder), recurrent episode, severe (HCC) Diagnosis: Principal Problem:   MDD (major depressive disorder), recurrent episode, severe (HCC) Active Problems:   Stimulant use disorder (HCC) (cocaine)   Alcohol use disorder, moderate, dependence (HCC)   Cocaine use disorder, moderate, dependence (HCC)  Total Time spent with patient: 20 minutes  Past Psychiatric History: Past history of longstanding substance abuse and mood problems  Past Medical History:  Past Medical History:  Diagnosis Date   Anxiety    Depression    Gestational diabetes    History of substance abuse (HCC)    History of suicide attempt    History of thyroid disease    Hypertension    Thyroid disease     Past Surgical History:  Procedure Laterality Date   CESAREAN SECTION  2000, 2002, 2013   WISDOM TOOTH EXTRACTION     Family History:  Family History  Problem Relation Age of Onset   Diabetes Mother    Family Psychiatric  History: See previous Social History:  Social History   Substance and Sexual Activity  Alcohol Use Yes   Comment: ocassionlly     Social History   Substance and Sexual Activity  Drug Use Not Currently   Types: Cocaine, Marijuana   Comment: Positive UDS 12/2017, last used mj 3 days ago , last used cocaine  a couple of months ago     Social History   Socioeconomic History   Marital status: Single    Spouse name: Not on file   Number of children: Not on file   Years of education: Not on file   Highest education level: Not on  file  Occupational History   Not on file  Tobacco Use   Smoking status: Former    Packs/day: .25    Types: Cigarettes   Smokeless tobacco: Never  Vaping Use   Vaping Use: Never used  Substance and Sexual Activity   Alcohol use: Yes    Comment: ocassionlly   Drug use: Not Currently    Types: Cocaine, Marijuana    Comment: Positive UDS 12/2017, last used mj 3 days ago , last used cocaine  a couple of months ago    Sexual activity: Yes    Birth control/protection: None  Other Topics Concern   Not on file  Social History Narrative   Not on file   Social Determinants of Health   Financial Resource Strain: Not on file  Food Insecurity: Food Insecurity Present (05/07/2023)   Hunger Vital Sign    Worried About Running Out of Food in the Last Year: Often true    Ran Out of Food in the Last Year: Often true  Transportation Needs: Unmet Transportation Needs (05/07/2023)   PRAPARE - Administrator, Civil Service (Medical): Yes    Lack of Transportation (Non-Medical): Yes  Physical Activity: Not on file  Stress: Not on file  Social Connections: Not on file   Additional Social History:  Sleep: Fair  Appetite:  Fair  Current Medications: Current Facility-Administered Medications  Medication Dose Route Frequency Provider Last Rate Last Admin   acetaminophen (TYLENOL) tablet 650 mg  650 mg Oral Q6H PRN Bennett, Christal H, NP   650 mg at 05/10/23 0815   alum & mag hydroxide-simeth (MAALOX/MYLANTA) 200-200-20 MG/5ML suspension 30 mL  30 mL Oral Q4H PRN Bennett, Christal H, NP       fluticasone (FLONASE) 50 MCG/ACT nasal spray 2 spray  2 spray Each Nare Daily PRN Madeleyn Schwimmer, Jackquline Denmark, MD   2 spray at 05/09/23 2120   folic acid (FOLVITE) tablet 1 mg  1 mg Oral Daily Bennett, Christal H, NP   1 mg at 05/10/23 0816   loratadine (CLARITIN) tablet 10 mg  10 mg Oral Daily Kery Batzel T, MD   10 mg at 05/10/23 0816   magnesium hydroxide (MILK OF  MAGNESIA) suspension 30 mL  30 mL Oral Daily PRN Bennett, Christal H, NP       multivitamin with minerals tablet 1 tablet  1 tablet Oral Daily Bennett, Christal H, NP   1 tablet at 05/10/23 0816   sertraline (ZOLOFT) tablet 50 mg  50 mg Oral Daily Karion Cudd T, MD   50 mg at 05/10/23 0816   thiamine (VITAMIN B1) tablet 100 mg  100 mg Oral Daily Bennett, Christal H, NP   100 mg at 05/10/23 0816   traZODone (DESYREL) tablet 100 mg  100 mg Oral QHS PRN Willeen Cass, Christal H, NP   100 mg at 05/09/23 2142    Lab Results: No results found for this or any previous visit (from the past 48 hour(s)).  Blood Alcohol level:  Lab Results  Component Value Date   ETH 190 (H) 05/07/2023   ETH <10 07/09/2022    Metabolic Disorder Labs: Lab Results  Component Value Date   HGBA1C 5.4 07/21/2020   MPG 108 07/21/2020   MPG 111.15 06/05/2020   No results found for: "PROLACTIN" Lab Results  Component Value Date   CHOL 218 (H) 01/02/2018   TRIG 187 (H) 01/02/2018   HDL 81 01/02/2018   CHOLHDL 2.7 01/02/2018   VLDL 37 01/02/2018   LDLCALC 100 (H) 01/02/2018   LDLCALC 82 01/12/2016    Physical Findings: AIMS:  , ,  ,  ,    CIWA:  CIWA-Ar Total: 1 COWS:     Musculoskeletal: Strength & Muscle Tone: within normal limits Gait & Station: normal Patient leans: N/A  Psychiatric Specialty Exam:  Presentation  General Appearance:  Disheveled  Eye Contact: Fair  Speech: Clear and Coherent  Speech Volume: Normal  Handedness: Right   Mood and Affect  Mood: Anxious; Depressed; Hopeless; Worthless  Affect: Depressed; Tearful   Thought Process  Thought Processes: Coherent  Descriptions of Associations:Intact  Orientation:Full (Time, Place and Person)  Thought Content:Logical  History of Schizophrenia/Schizoaffective disorder:No  Duration of Psychotic Symptoms:No data recorded Hallucinations:No data recorded Ideas of Reference:None  Suicidal Thoughts:No data  recorded Homicidal Thoughts:No data recorded  Sensorium  Memory: Immediate Good; Recent Good  Judgment: Poor  Insight: Poor   Executive Functions  Concentration: Fair  Attention Span: Fair  Recall: Fair  Fund of Knowledge: Fair  Language: Fair   Psychomotor Activity  Psychomotor Activity:No data recorded  Assets  Assets: Desire for Improvement; Housing; Physical Health; Resilience   Sleep  Sleep:No data recorded   Physical Exam: Physical Exam Vitals and nursing note reviewed.  Constitutional:      Appearance:  Normal appearance.  HENT:     Head: Normocephalic and atraumatic.     Mouth/Throat:     Pharynx: Oropharynx is clear.  Eyes:     Pupils: Pupils are equal, round, and reactive to light.  Cardiovascular:     Rate and Rhythm: Normal rate and regular rhythm.  Pulmonary:     Effort: Pulmonary effort is normal.     Breath sounds: Normal breath sounds.  Abdominal:     General: Abdomen is flat.     Palpations: Abdomen is soft.  Musculoskeletal:        General: Normal range of motion.  Skin:    General: Skin is warm and dry.  Neurological:     General: No focal deficit present.     Mental Status: She is alert. Mental status is at baseline.  Psychiatric:        Attention and Perception: Attention normal.        Mood and Affect: Mood normal.        Speech: Speech normal.        Behavior: Behavior is cooperative.        Thought Content: Thought content normal.        Cognition and Memory: Cognition normal.        Judgment: Judgment normal.    Review of Systems  Constitutional: Negative.   HENT: Negative.    Eyes: Negative.   Respiratory: Negative.    Cardiovascular: Negative.   Gastrointestinal: Negative.   Musculoskeletal: Negative.   Skin: Negative.   Neurological: Negative.   Psychiatric/Behavioral: Negative.     Blood pressure (!) 107/53, pulse 61, temperature 98.4 F (36.9 C), temperature source Oral, resp. rate 19, height 4'  11" (1.499 m), weight 72.6 kg, last menstrual period 04/09/2023, SpO2 100 %. Body mass index is 32.32 kg/m.   Treatment Plan Summary: Plan discontinuing all medications that are not specifically necessary.  Patient does not agitated does not need the extra PRNs that could be overuse does not need detox protocol.  Hope that by tomorrow she will be awake enough that we can have a better conversation.  Continue antidepressant.  Mordecai Rasmussen, MD 05/10/2023, 10:48 AM

## 2023-05-10 NOTE — Group Note (Signed)
Recreation Therapy Group Note   Group Topic:Leisure Education  Group Date: 05/10/2023 Start Time: 1000 End Time: 1055 Facilitators: Rosina Lowenstein, LRT, CTRS Location:  Dayroom  Group Description: Leisure. Patients were given the option to choose from singing karaoke, making origami, using oil pastels, or playing UNO. LRT and pts discussed the meaning of leisure, the importance of participating in leisure during their free time/when they're outside of the hospital, as well as how our leisure interests can also serve as coping skills. Pt identified two leisure interests and shared with the group.   Goal Area(s) Addressed:  Patient will identify a current leisure interest.  Patient will learn the definition of "leisure". Patient will practice making a positive decision. Patient will have the opportunity to try a new leisure activity. Patient will communicate with peers and LRT.   Affect/Mood: N/A   Participation Level: Did not attend    Clinical Observations/Individualized Feedback: Antanasia did not attend group due to resting in her room.  Plan: Continue to engage patient in RT group sessions 2-3x/week.   Rosina Lowenstein, LRT, CTRS 05/10/2023 11:35 AM

## 2023-05-10 NOTE — Plan of Care (Signed)
  Problem: Activity: Goal: Risk for activity intolerance will decrease Outcome: Progressing   Problem: Nutrition: Goal: Adequate nutrition will be maintained Outcome: Progressing   Problem: Coping: Goal: Level of anxiety will decrease Outcome: Progressing   

## 2023-05-10 NOTE — [facility] Counselor (Signed)
Adult Comprehensive Assessment  Patient ID: [name], female   DOB: June 09, 1986, 37 y.o.   MRN: [id number]  Information Source: Information source: Patient  Current Stressors:  Patient states their primary concerns and needs for treatment are:: During assessment, patient states she was having diffuiculty with depression. States she is stressed out about living in the home her mother died in which causes her to become depressed. Currently patient is denying SI and wishes ot be discharged from the hospital. Alsa denies HI/AVH/ Patient states their goals for this hospitilization and ongoing recovery are:: States her goal for hospitalization is simply "to leave." Educational / Learning stressors: none reported Employment / Job issues: patient is unemployed Family Relationships: none reported Surveyor, quantity / Lack of resources (include bankruptcy): states she recieves little income Housing / Lack of housing: none reported Physical health (include injuries & life threatening diseases): none reported Social relationships: none reported Substance abuse: Patient denies, UDS pos for Cannabis and Cocaine Bereavement / Loss: none reported  Living/Environment/Situation:  Living Arrangements: Spouse/significant other Living conditions (as described by patient or guardian): WNL Who else lives in the home?: patient lives with partner How long has patient lived in current situation?: 6 years  Family History:  Marital status: Married Number of Years Married:  (unkown) Are you sexually active?: No What is your sexual orientation?: Heterosexual Does patient have children?: Yes How many children?: 6 How is patient's relationship with their children?: Children in DSS Custody.  Childhood History:  By whom was/is the patient raised?: Mother Additional childhood history information: Pt was born and raised in [place], [place].  Her parents were married, but separated when she was approximately 57-5 yo.   Description of patient's relationship with caregiver when they were a child: reports "close" relationship with mother as a child Patient's description of current relationship with people who raised him/her: caregiver is deceased How were you disciplined when you got in trouble as a child/adolescent?: Pt declined to answer. Does patient have siblings?: Yes Number of Siblings: 1 Description of patient's current relationship with siblings: Pt reports that she does not have a relationship with her sister. Did patient suffer any verbal/emotional/physical/sexual abuse as a child?: No Did patient suffer from severe childhood neglect?: No Has patient ever been sexually abused/assaulted/raped as an adolescent or adult?: No Was the patient ever a victim of a crime or a disaster?: No Witnessed domestic violence?: No Has patient been affected by domestic violence as an adult?: No  Education:  Highest grade of school patient has completed: 10th grade Currently a student?: No Learning disability?: No  Employment/Work Situation:   Employment Situation: On disability Why is Patient on Disability: Mental health Patient's Job has Been Impacted by Current Illness: No What is the Longest Time Patient has Held a Job?: "2 years" 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?: No  Financial Resources:   Financial resources: Insurance claims handler, Medicaid Does patient have a Lawyer or guardian?: No  Alcohol/Substance Abuse:   Social History   Substance and Sexual Activity  Alcohol Use Yes   Comment: ocassionlly   Social History   Substance and Sexual Activity  Drug Use Not Currently   Types: Cocaine, Marijuana   Comment: Positive UDS 12/2017, last used mj 3 days ago , last used cocaine  a couple of months ago    Tobacco Use: Medium Risk (05/07/2023)   Patient History    Smoking Tobacco Use: Former  Smokeless Tobacco Use: Never     Passive Exposure: Not on file  Drugs of Abuse     Component Value Date/Time   LABOPIA NONE DETECTED 05/07/2023 0800   COCAINSCRNUR POSITIVE (A) 05/07/2023 0800   LABBENZ NONE DETECTED 05/07/2023 0800   AMPHETMU NONE DETECTED 05/07/2023 0800   THCU POSITIVE (A) 05/07/2023 0800   LABBARB NONE DETECTED 05/07/2023 0800    What has been your use of drugs/alcohol within the last 12 months?: Patient denies, UDS pos for Cocaine and Cannabis If attempted suicide, did drugs/alcohol play a role in this?: No Alcohol/Substance Abuse Treatment Hx: Denies past history Has alcohol/substance abuse ever caused legal problems?: No  Social Support System:   Conservation officer, nature Support System: Poor Describe Community Support System: Paitent denies Type of faith/religion: Nurse, mental health Witness How does patient's faith help to cope with current illness?: patient dnies  Leisure/Recreation:   Do You Have Hobbies?: No  Strengths/Needs:   Patient states these barriers may affect/interfere with their treatment: none reported Patient states these barriers may affect their return to the community: none reported Other important information patient would like considered in planning for their treatment: none reported  Discharge Plan:   Currently receiving community mental health services: No Does patient have access to transportation?: No Does patient have financial barriers related to discharge medications?: No ([employer]) Will patient be returning to same living situation after discharge?: Yes  Summary/Recommendations:   Summary and Recommendations (to be completed by the evaluator): 37 y/o female w/ dx of MDD recurrent severe w/ out psychotic features from [facility] w/ [employer] [facility] admitted due to worsening suicidal ideation. During assessment, patient states she was having diffuiculty with depression. States she is stressed out about living in the home her mother died in which causes her to become  depressed. Currently patient is denying SI and wishes ot be discharged from the hospital. Alsa denies HI/AVH. States her goal for hospitalization is simply "to leave." Therapeutic recommendations include further crisis stabilization medication, group therapy, medication management, and case management.  Corky Crafts. 05/10/2023

## 2023-05-11 DIAGNOSIS — F332 Major depressive disorder, recurrent severe without psychotic features: Secondary | ICD-10-CM | POA: Diagnosis not present

## 2023-05-11 MED ORDER — ONDANSETRON 4 MG PO TBDP
4.0000 mg | ORAL_TABLET | Freq: Three times a day (TID) | ORAL | Status: DC | PRN
  Administered 2023-05-11 – 2023-05-13 (×7): 4 mg via ORAL
  Filled 2023-05-11 (×8): qty 1

## 2023-05-11 MED ORDER — GABAPENTIN 300 MG PO CAPS
300.0000 mg | ORAL_CAPSULE | Freq: Four times a day (QID) | ORAL | Status: DC | PRN
  Administered 2023-05-11 – 2023-05-14 (×4): 300 mg via ORAL
  Filled 2023-05-11 (×4): qty 1

## 2023-05-11 MED ORDER — QUETIAPINE FUMARATE 100 MG PO TABS
100.0000 mg | ORAL_TABLET | Freq: Every evening | ORAL | Status: DC | PRN
  Administered 2023-05-11 – 2023-05-14 (×4): 100 mg via ORAL
  Filled 2023-05-11 (×5): qty 1

## 2023-05-11 NOTE — Plan of Care (Signed)
D- Patient alert and oriented. Patient presents in a pleasant mood on assessment stating that she slept "ok, after I threw up half my guts", last night. Patient had some complaints of nausea this morning, in which this writer reached out to MD for new orders. When asked about depression and anxiety, patient stated "so-so", but she did report that she feels like it's getting better. Patient denies SI, HI, AVH, and pain at this time. Patient had no stated goals for today.  A- Scheduled medications administered to patient, per MD orders. Support and encouragement provided.  Routine safety checks conducted every 15 minutes.  Patient informed to notify staff with problems or concerns.  R- No adverse drug reactions noted. Patient contracts for safety at this time. Patient compliant with medications and treatment plan. Patient receptive, calm, and cooperative. Patient interacts well with others on the unit. Patient remains safe at this time.  Problem: Education: Goal: Knowledge of General Education information will improve Description: Including pain rating scale, medication(s)/side effects and non-pharmacologic comfort measures Outcome: Progressing   Problem: Health Behavior/Discharge Planning: Goal: Ability to manage health-related needs will improve Outcome: Progressing   Problem: Clinical Measurements: Goal: Ability to maintain clinical measurements within normal limits will improve Outcome: Progressing Goal: Will remain free from infection Outcome: Progressing Goal: Diagnostic test results will improve Outcome: Progressing Goal: Respiratory complications will improve Outcome: Progressing Goal: Cardiovascular complication will be avoided Outcome: Progressing   Problem: Activity: Goal: Risk for activity intolerance will decrease Outcome: Progressing   Problem: Nutrition: Goal: Adequate nutrition will be maintained Outcome: Progressing   Problem: Coping: Goal: Level of anxiety will  decrease Outcome: Progressing   Problem: Elimination: Goal: Will not experience complications related to bowel motility Outcome: Progressing Goal: Will not experience complications related to urinary retention Outcome: Progressing   Problem: Pain Managment: Goal: General experience of comfort will improve Outcome: Progressing   Problem: Safety: Goal: Ability to remain free from injury will improve Outcome: Progressing   Problem: Skin Integrity: Goal: Risk for impaired skin integrity will decrease Outcome: Progressing   Problem: Education: Goal: Utilization of techniques to improve thought processes will improve Outcome: Progressing Goal: Knowledge of the prescribed therapeutic regimen will improve Outcome: Progressing   Problem: Activity: Goal: Interest or engagement in leisure activities will improve Outcome: Progressing Goal: Imbalance in normal sleep/wake cycle will improve Outcome: Progressing   Problem: Coping: Goal: Coping ability will improve Outcome: Progressing Goal: Will verbalize feelings Outcome: Progressing   Problem: Health Behavior/Discharge Planning: Goal: Ability to make decisions will improve Outcome: Progressing Goal: Compliance with therapeutic regimen will improve Outcome: Progressing   Problem: Role Relationship: Goal: Will demonstrate positive changes in social behaviors and relationships Outcome: Progressing   Problem: Safety: Goal: Ability to disclose and discuss suicidal ideas will improve Outcome: Progressing Goal: Ability to identify and utilize support systems that promote safety will improve Outcome: Progressing   Problem: Self-Concept: Goal: Will verbalize positive feelings about self Outcome: Progressing Goal: Level of anxiety will decrease Outcome: Progressing   Problem: Education: Goal: Knowledge of disease or condition will improve Outcome: Progressing Goal: Understanding of discharge needs will improve Outcome:  Progressing   Problem: Health Behavior/Discharge Planning: Goal: Ability to identify changes in lifestyle to reduce recurrence of condition will improve Outcome: Progressing Goal: Identification of resources available to assist in meeting health care needs will improve Outcome: Progressing   Problem: Physical Regulation: Goal: Complications related to the disease process, condition or treatment will be avoided or minimized Outcome: Progressing  Problem: Safety: Goal: Ability to remain free from injury will improve Outcome: Progressing   Problem: Education: Goal: Knowledge of [facility] Education information/materials will improve Outcome: Progressing Goal: Emotional status will improve Outcome: Progressing Goal: Mental status will improve Outcome: Progressing Goal: Verbalization of understanding the information provided will improve Outcome: Progressing   Problem: Safety: Goal: Periods of time without injury will increase Outcome: Progressing

## 2023-05-11 NOTE — Group Note (Signed)
[facility] LCSW Group Therapy Note   Group Date: 05/11/2023 Start Time: 1300 End Time: 1400  Type of Therapy and Topic:  Group Therapy:  Feelings around Relapse and Recovery  Participation Level:  Did Not Attend   Mood:  Description of Group:    Patients in this group will discuss emotions they experience before and after a relapse. They will process how experiencing these feelings, or avoidance of experiencing them, relates to having a relapse. Facilitator will guide patients to explore emotions they have related to recovery. Patients will be encouraged to process which emotions are more powerful. They will be guided to discuss the emotional reaction significant others in their lives may have to patients' relapse or recovery. Patients will be assisted in exploring ways to respond to the emotions of others without this contributing to a relapse.  Therapeutic Goals: Patient will identify two or more emotions that lead to relapse for them:  Patient will identify two emotions that result when they relapse:  Patient will identify two emotions related to recovery:  Patient will demonstrate ability to communicate their needs through discussion and/or role plays.   Summary of Patient Progress: Patient did not attend group despite encouraged participation.     Therapeutic Modalities:   Cognitive Behavioral Therapy Solution-Focused Therapy Assertiveness Training Relapse Prevention Therapy   Markela Wee W Zo [place], LCSWA 

## 2023-05-11 NOTE — Progress Notes (Signed)
[facility] MD Progress Note  05/11/2023 4:24 PM [name]  MRN:  [id number] Subjective: Follow-up 37 year old woman with depression.  Patient talked with me for a while today about how she feels hopeless in her situation.  She has never recovered from her mother's death and as she put it she keeps trying to make things change and nothing is changing.  Talked about how she became very disillusioned with [employer] when her peer support testified against her in the custody of her children.  Now feels very isolated at home.  No active suicidal intent but quite hopeless with no real plan for the future.  Physically stable still tired a lot of the time. Principal Problem: MDD (major depressive disorder), recurrent episode, severe (HCC) Diagnosis: Principal Problem:   MDD (major depressive disorder), recurrent episode, severe (HCC) Active Problems:   Stimulant use disorder (HCC) (cocaine)   Alcohol use disorder, moderate, dependence (HCC)   Cocaine use disorder, moderate, dependence (HCC)  Total Time spent with patient: 30 minutes  Past Psychiatric History: Past history of depression and anxiety and alcohol abuse and cocaine abuse  Past Medical History:  Past Medical History:  Diagnosis Date   Anxiety    Depression    Gestational diabetes    History of substance abuse (HCC)    History of suicide attempt    History of thyroid disease    Hypertension    Thyroid disease     Past Surgical History:  Procedure Laterality Date   CESAREAN SECTION  2000, 2002, 2013   WISDOM TOOTH EXTRACTION     Family History:  Family History  Problem Relation Age of Onset   Diabetes Mother    Family Psychiatric  History: See previous Social History:  Social History   Substance and Sexual Activity  Alcohol Use Yes   Comment: ocassionlly     Social History   Substance and Sexual Activity  Drug Use Not Currently   Types: Cocaine, Marijuana   Comment: Positive UDS 12/2017, last used mj 3 days ago , last  used cocaine  a couple of months ago     Social History   Socioeconomic History   Marital status: Single    Spouse name: Not on file   Number of children: Not on file   Years of education: Not on file   Highest education level: Not on file  Occupational History   Not on file  Tobacco Use   Smoking status: Former    Packs/day: .25    Types: Cigarettes   Smokeless tobacco: Never  Vaping Use   Vaping Use: Never used  Substance and Sexual Activity   Alcohol use: Yes    Comment: ocassionlly   Drug use: Not Currently    Types: Cocaine, Marijuana    Comment: Positive UDS 12/2017, last used mj 3 days ago , last used cocaine  a couple of months ago    Sexual activity: Yes    Birth control/protection: None  Other Topics Concern   Not on file  Social History Narrative   Not on file   Social Determinants of Health   Financial Resource Strain: Not on file  Food Insecurity: Food Insecurity Present (05/07/2023)   Hunger Vital Sign    Worried About Running Out of Food in the Last Year: Often true    Ran Out of Food in the Last Year: Often true  Transportation Needs: Unmet Transportation Needs (05/07/2023)   PRAPARE - Administrator, Civil Service (Medical):  Yes    Lack of Transportation (Non-Medical): Yes  Physical Activity: Not on file  Stress: Not on file  Social Connections: Not on file   Additional Social History:                         Sleep: Fair  Appetite:  Fair  Current Medications: Current Facility-Administered Medications  Medication Dose Route Frequency Provider Last Rate Last Admin   acetaminophen (TYLENOL) tablet 650 mg  650 mg Oral Q6H PRN Bennett, Christal H, NP   650 mg at 05/10/23 1629   alum & mag hydroxide-simeth (MAALOX/MYLANTA) 200-200-20 MG/5ML suspension 30 mL  30 mL Oral Q4H PRN Bennett, Christal H, NP       docusate sodium (COLACE) capsule 100 mg  100 mg Oral BID Kennetha Pearman T, MD   100 mg at 05/11/23 0815   fluticasone  (FLONASE) 50 MCG/ACT nasal spray 2 spray  2 spray Each Nare Daily PRN Terilyn Sano, Jackquline Denmark, MD   2 spray at 05/11/23 4540   folic acid (FOLVITE) tablet 1 mg  1 mg Oral Daily Bennett, Christal H, NP   1 mg at 05/11/23 0815   gabapentin (NEURONTIN) capsule 300 mg  300 mg Oral Q6H PRN Fizza Scales, Jackquline Denmark, MD       hydrOXYzine (ATARAX) tablet 50 mg  50 mg Oral Q6H PRN Zaim Nitta T, MD   50 mg at 05/11/23 1300   loratadine (CLARITIN) tablet 10 mg  10 mg Oral Daily Arling Cerone T, MD   10 mg at 05/11/23 0815   magnesium hydroxide (MILK OF MAGNESIA) suspension 30 mL  30 mL Oral Daily PRN Bennett, Christal H, NP       multivitamin with minerals tablet 1 tablet  1 tablet Oral Daily Bennett, Christal H, NP   1 tablet at 05/11/23 0815   ondansetron (ZOFRAN-ODT) disintegrating tablet 4 mg  4 mg Oral Q8H PRN Aily Tzeng T, MD   4 mg at 05/11/23 1009   QUEtiapine (SEROQUEL) tablet 100 mg  100 mg Oral QHS PRN Willett Lefeber T, MD       sertraline (ZOLOFT) tablet 50 mg  50 mg Oral Daily Krisanne Lich T, MD   50 mg at 05/11/23 0815   thiamine (VITAMIN B1) tablet 100 mg  100 mg Oral Daily Bennett, Christal H, NP   100 mg at 05/11/23 0815   traZODone (DESYREL) tablet 100 mg  100 mg Oral QHS PRN Bennett, Christal H, NP   100 mg at 05/10/23 2053    Lab Results: No results found for this or any previous visit (from the past 48 hour(s)).  Blood Alcohol level:  Lab Results  Component Value Date   ETH 190 (H) 05/07/2023   ETH <10 07/09/2022    Metabolic Disorder Labs: Lab Results  Component Value Date   HGBA1C 5.4 07/21/2020   MPG 108 07/21/2020   MPG 111.15 06/05/2020   No results found for: "PROLACTIN" Lab Results  Component Value Date   CHOL 218 (H) 01/02/2018   TRIG 187 (H) 01/02/2018   HDL 81 01/02/2018   CHOLHDL 2.7 01/02/2018   VLDL 37 01/02/2018   LDLCALC 100 (H) 01/02/2018   LDLCALC 82 01/12/2016    Physical Findings: AIMS:  , ,  ,  ,    CIWA:  CIWA-Ar Total: 1 COWS:      Musculoskeletal: Strength & Muscle Tone: within normal limits Gait & Station: normal Patient leans: N/A  Psychiatric  Specialty Exam:  Presentation  General Appearance:  Disheveled  Eye Contact: Fair  Speech: Clear and Coherent  Speech Volume: Normal  Handedness: Right   Mood and Affect  Mood: Anxious; Depressed; Hopeless; Worthless  Affect: Depressed; Tearful   Thought Process  Thought Processes: Coherent  Descriptions of Associations:Intact  Orientation:Full (Time, Place and Person)  Thought Content:Logical  History of Schizophrenia/Schizoaffective disorder:No  Duration of Psychotic Symptoms:No data recorded Hallucinations:No data recorded Ideas of Reference:None  Suicidal Thoughts:No data recorded Homicidal Thoughts:No data recorded  Sensorium  Memory: Immediate Good; Recent Good  Judgment: Poor  Insight: Poor   Executive Functions  Concentration: Fair  Attention Span: Fair  Recall: Fair  Fund of Knowledge: Fair  Language: Fair   Psychomotor Activity  Psychomotor Activity:No data recorded  Assets  Assets: Desire for Improvement; Housing; Physical Health; Resilience   Sleep  Sleep:No data recorded   Physical Exam: Physical Exam Constitutional:      Appearance: Normal appearance.  HENT:     Head: Normocephalic and atraumatic.     Mouth/Throat:     Pharynx: Oropharynx is clear.  Eyes:     Pupils: Pupils are equal, round, and reactive to light.  Cardiovascular:     Rate and Rhythm: Normal rate and regular rhythm.  Pulmonary:     Effort: Pulmonary effort is normal.     Breath sounds: Normal breath sounds.  Abdominal:     General: Abdomen is flat.     Palpations: Abdomen is soft.  Musculoskeletal:        General: Normal range of motion.  Skin:    General: Skin is warm and dry.  Neurological:     General: No focal deficit present.     Mental Status: She is alert. Mental status is at baseline.   Psychiatric:        Attention and Perception: Attention normal. She is attentive.        Mood and Affect: Mood is anxious and depressed.        Speech: Speech is delayed.        Behavior: Behavior is slowed.        Thought Content: Thought content normal. Thought content does not include suicidal ideation.    Review of Systems  Constitutional: Negative.   HENT: Negative.    Eyes: Negative.   Respiratory: Negative.    Cardiovascular: Negative.   Gastrointestinal: Negative.   Musculoskeletal: Negative.   Skin: Negative.   Neurological: Negative.   Psychiatric/Behavioral:  Positive for depression and substance abuse. The patient is nervous/anxious.    Blood pressure (!) 121/55, pulse 67, temperature 98.1 F (36.7 C), temperature source Oral, resp. rate 18, height 4\' 11"  (1.499 m), weight 72.6 kg, last menstrual period 04/09/2023, SpO2 100 %. Body mass index is 32.32 kg/m.   Treatment Plan Summary: Medication management and Plan patient halfheartedly requested discharge.  I do not think that would be in her best interest.  There is nothing going on at home except for her drinking.  I am going to put her on a as needed Seroquel at night for sleep rather than repeating the Restoril and add gabapentin as needed for anxiety.  Acknowledge that she still stays tired a lot but encouraged her to try and be up and out of bed when she can.  Reassess over the next couple days.  Mordecai Rasmussen, MD 05/11/2023, 4:24 PM

## 2023-05-12 DIAGNOSIS — F332 Major depressive disorder, recurrent severe without psychotic features: Secondary | ICD-10-CM | POA: Diagnosis not present

## 2023-05-12 LAB — GASTROINTESTINAL PANEL BY PCR, STOOL (REPLACES STOOL CULTURE)

## 2023-05-12 MED ORDER — LOPERAMIDE HCL 2 MG PO CAPS
2.0000 mg | ORAL_CAPSULE | ORAL | Status: DC | PRN
  Administered 2023-05-12 – 2023-05-13 (×3): 2 mg via ORAL
  Filled 2023-05-12 (×6): qty 1

## 2023-05-12 MED ORDER — PROMETHAZINE HCL 25 MG PO TABS
50.0000 mg | ORAL_TABLET | Freq: Three times a day (TID) | ORAL | Status: DC | PRN
  Administered 2023-05-12 – 2023-05-13 (×3): 50 mg via ORAL
  Filled 2023-05-12 (×4): qty 2

## 2023-05-12 NOTE — Progress Notes (Signed)
Pt threw tray into the hallway after she discovered the order was incorrect. Pt continues to show labile behavior and remains fixated on her food orders, and with disruptive behaviors if any items are perceived to be missing. Dining staff informed what was placed on ticket was provided. Writer approached and allowed pt to ventilate, also phoned in new meal order.

## 2023-05-12 NOTE — Progress Notes (Signed)
Pt reported another episode of emesis. Prn given by another Charity fundraiser . Writer went to reassess and noted improvement, pt eating her lunch. PO fluids encouraged. Will con't to monitor.

## 2023-05-12 NOTE — Progress Notes (Signed)
Pt reports emesis episode x 3 and diarrhea episode x 2. She states '' I think I'm getting something, now I've got the squirts. ''  Pt requesting prn zofran stating '' I'm nauseated and I've thrown up three times I need more zofran. '' Pt informed too soon to receive and notified MD. Pt given ginger ale and gatorade and encouraged po fluids. MD order received for phenergan 50mg  PO PRN Q8HR and Immodium 2mg  PRN loose stools. Pt given prn's and meal tray. Encouraged rest. Will con't to monitor.

## 2023-05-12 NOTE — [facility] Group Notes (Signed)
[facility] Group Notes:  (Nursing/MHT/Case Management/Adjunct)  Date:  05/12/2023  Time:  11:08 AM  Type of Therapy:  Psychoeducational Skills  Participation Level:  Active  Participation Quality:  Appropriate  Affect:  Appropriate  Cognitive:  Alert and Appropriate  Insight:  Appropriate  Engagement in Group:  Engaged  Modes of Intervention:  Discussion, Education, and Exploration  Summary of Progress/Problems: Patients were given two poems to read one by Jacquelyne Balint '' The owl and the Chimpanzee. '' And ''watch your thoughts '' by Renata Caprice. Pt were then asked to reflect on how anxiety/negative behavioral patterns have impacted their lives and mental health, and what healthy or positive coping mechanisms can be implemented to promote mental wellbeing. [name] attend group and shared '' what do I do when so many negative things keep on me, I just blow up ''  [name], [name] 05/12/2023, 11:08 AM

## 2023-05-12 NOTE — Progress Notes (Signed)
Patient ID: [name], female   DOB: March 06, 1986, 37 y.o.   MRN: [id number] Patient presents with anxious mood, affect labile.  This am [name] reported feeling nauseated and with GI distress and heart burn. Pt offered maalox as well as ginger ale but request milk for heart burn. VS obtained and WNL.  Patient did report previous issues with constipation and night shift RN report prn given to the patient with no results. MD notified of above.  Patient later received her breakfast, and became very upset due to not having a biscuit. She then continued to yell at MHT and arguing over her not getting a biscuit despite staff calling for additional tray as her initial tray order was incorrect. [name] displays poor impulse control and becoming easily agitated when her needs are not immediately met.  She reported poor sleep due to upset stomach last night and noted large emesis in the bathroom around toilet.  Pt completed her self inventory form and rated her depression at 8/10 on scale, 10 being worst 0 being none. Pt rated her hopelessness at 6/10 on scale 10 being worst 0 being none. Patient reports her goal is to '' go home ''  Pt has been visible on the unit. Able to make her needs known. Pt is safe, will con't to monitor.

## 2023-05-12 NOTE — [facility] Suicide Risk Assessment (Signed)
[facility] INPATIENT:  Family/Significant Other Suicide Prevention Education  Suicide Prevention Education:  Contact Attempts: Wandra Arthurs, Partner, [phone],  has been identified by the patient as the family member/significant other with whom the patient will be residing, and identified as the person(s) who will aid the patient in the event of a mental health crisis.  With written consent from the patient, two attempts were made to provide suicide prevention education, prior to and/or following the patient's discharge.  We were unsuccessful in providing suicide prevention education.  A suicide education pamphlet was given to the patient to share with family/significant other.  Date and time of first attempt:05/12/2023 1:00 pm   Marshell Levan 05/12/2023, 1:05 PM

## 2023-05-12 NOTE — Group Note (Signed)
LCSW Group Therapy Note   Group Date: 05/12/2023 Start Time: 1340 End Time: 1420   Type of Therapy and Topic:  Group Therapy: Stress Management  Participation Level:  Did Not Attend    Summary of Patient Progress:  The patient did not attend group.    Akeema Broder S Kamir Selover, LCSWA 05/12/2023  2:33 PM    

## 2023-05-12 NOTE — Progress Notes (Signed)
[facility] MD Progress Note  05/12/2023 2:39 PM [name]  MRN:  [id number] Subjective: [name] is seen on rounds.  She has no complaints.  She been compliant with medications and denies any side effects.  Nurses report no issues.  They do tell me that she has been sick with diarrhea.  Principal Problem: MDD (major depressive disorder), recurrent episode, severe (HCC) Diagnosis: Principal Problem:   MDD (major depressive disorder), recurrent episode, severe (HCC) Active Problems:   Stimulant use disorder (HCC) (cocaine)   Alcohol use disorder, moderate, dependence (HCC)   Cocaine use disorder, moderate, dependence (HCC)  Total Time spent with patient: 15 minutes  Past Psychiatric History: Depression and polysubstance abuse  Past Medical History:  Past Medical History:  Diagnosis Date   Anxiety    Depression    Gestational diabetes    History of substance abuse (HCC)    History of suicide attempt    History of thyroid disease    Hypertension    Thyroid disease     Past Surgical History:  Procedure Laterality Date   CESAREAN SECTION  2000, 2002, 2013   WISDOM TOOTH EXTRACTION     Family History:  Family History  Problem Relation Age of Onset   Diabetes Mother    Family Psychiatric  History: Unremarkable Social History:  Social History   Substance and Sexual Activity  Alcohol Use Yes   Comment: ocassionlly     Social History   Substance and Sexual Activity  Drug Use Not Currently   Types: Cocaine, Marijuana   Comment: Positive UDS 12/2017, last used mj 3 days ago , last used cocaine  a couple of months ago     Social History   Socioeconomic History   Marital status: Single    Spouse name: Not on file   Number of children: Not on file   Years of education: Not on file   Highest education level: Not on file  Occupational History   Not on file  Tobacco Use   Smoking status: Former    Packs/day: .25    Types: Cigarettes   Smokeless tobacco: Never  Vaping  Use   Vaping Use: Never used  Substance and Sexual Activity   Alcohol use: Yes    Comment: ocassionlly   Drug use: Not Currently    Types: Cocaine, Marijuana    Comment: Positive UDS 12/2017, last used mj 3 days ago , last used cocaine  a couple of months ago    Sexual activity: Yes    Birth control/protection: None  Other Topics Concern   Not on file  Social History Narrative   Not on file   Social Determinants of Health   Financial Resource Strain: Not on file  Food Insecurity: Food Insecurity Present (05/07/2023)   Hunger Vital Sign    Worried About Running Out of Food in the Last Year: Often true    Ran Out of Food in the Last Year: Often true  Transportation Needs: Unmet Transportation Needs (05/07/2023)   PRAPARE - Administrator, Civil Service (Medical): Yes    Lack of Transportation (Non-Medical): Yes  Physical Activity: Not on file  Stress: Not on file  Social Connections: Not on file   Additional Social History:                         Sleep: Fair  Appetite:  Fair  Current Medications: Current Facility-Administered Medications  Medication Dose Route Frequency  Provider Last Rate Last Admin   acetaminophen (TYLENOL) tablet 650 mg  650 mg Oral Q6H PRN Bennett, Christal H, NP   650 mg at 05/12/23 1022   alum & mag hydroxide-simeth (MAALOX/MYLANTA) 200-200-20 MG/5ML suspension 30 mL  30 mL Oral Q4H PRN Bennett, Christal H, NP       docusate sodium (COLACE) capsule 100 mg  100 mg Oral BID Clapacs, John T, MD   100 mg at 05/12/23 0812   fluticasone (FLONASE) 50 MCG/ACT nasal spray 2 spray  2 spray Each Nare Daily PRN Clapacs, Jackquline Denmark, MD   2 spray at 05/12/23 8119   folic acid (FOLVITE) tablet 1 mg  1 mg Oral Daily Bennett, Christal H, NP   1 mg at 05/12/23 1478   gabapentin (NEURONTIN) capsule 300 mg  300 mg Oral Q6H PRN Clapacs, John T, MD   300 mg at 05/11/23 1720   hydrOXYzine (ATARAX) tablet 50 mg  50 mg Oral Q6H PRN Clapacs, John T, MD   50 mg  at 05/12/23 1021   loratadine (CLARITIN) tablet 10 mg  10 mg Oral Daily Clapacs, John T, MD   10 mg at 05/12/23 2956   magnesium hydroxide (MILK OF MAGNESIA) suspension 30 mL  30 mL Oral Daily PRN Bennett, Christal H, NP   30 mL at 05/11/23 2255   multivitamin with minerals tablet 1 tablet  1 tablet Oral Daily Bennett, Christal H, NP   1 tablet at 05/11/23 0815   ondansetron (ZOFRAN-ODT) disintegrating tablet 4 mg  4 mg Oral Q8H PRN Clapacs, John T, MD   4 mg at 05/12/23 1232   QUEtiapine (SEROQUEL) tablet 100 mg  100 mg Oral QHS PRN Clapacs, John T, MD   100 mg at 05/11/23 2106   sertraline (ZOLOFT) tablet 50 mg  50 mg Oral Daily Clapacs, John T, MD   50 mg at 05/12/23 2130   thiamine (VITAMIN B1) tablet 100 mg  100 mg Oral Daily Bennett, Christal H, NP   100 mg at 05/12/23 8657   traZODone (DESYREL) tablet 100 mg  100 mg Oral QHS PRN Willeen Cass, Christal H, NP   100 mg at 05/11/23 2054    Lab Results: No results found for this or any previous visit (from the past 48 hour(s)).  Blood Alcohol level:  Lab Results  Component Value Date   ETH 190 (H) 05/07/2023   ETH <10 07/09/2022    Metabolic Disorder Labs: Lab Results  Component Value Date   HGBA1C 5.4 07/21/2020   MPG 108 07/21/2020   MPG 111.15 06/05/2020   No results found for: "PROLACTIN" Lab Results  Component Value Date   CHOL 218 (H) 01/02/2018   TRIG 187 (H) 01/02/2018   HDL 81 01/02/2018   CHOLHDL 2.7 01/02/2018   VLDL 37 01/02/2018   LDLCALC 100 (H) 01/02/2018   LDLCALC 82 01/12/2016    Physical Findings: AIMS:  , ,  ,  ,    CIWA:  CIWA-Ar Total: 1 COWS:     Musculoskeletal: Strength & Muscle Tone: within normal limits Gait & Station: normal Patient leans: N/A  Psychiatric Specialty Exam:  Presentation  General Appearance:  Disheveled  Eye Contact: Fair  Speech: Clear and Coherent  Speech Volume: Normal  Handedness: Right   Mood and Affect  Mood: Anxious; Depressed; Hopeless;  Worthless  Affect: Depressed; Tearful   Thought Process  Thought Processes: Coherent  Descriptions of Associations:Intact  Orientation:Full (Time, Place and Person)  Thought Content:Logical  History  of Schizophrenia/Schizoaffective disorder:No  Duration of Psychotic Symptoms:No data recorded Hallucinations:No data recorded Ideas of Reference:None  Suicidal Thoughts:No data recorded Homicidal Thoughts:No data recorded  Sensorium  Memory: Immediate Good; Recent Good  Judgment: Poor  Insight: Poor   Executive Functions  Concentration: Fair  Attention Span: Fair  Recall: Fair  Fund of Knowledge: Fair  Language: Fair   Psychomotor Activity  Psychomotor Activity:No data recorded  Assets  Assets: Desire for Improvement; Housing; Physical Health; Resilience   Sleep  Sleep:No data recorded    Blood pressure (!) 120/50, pulse 72, temperature 98.7 F (37.1 C), temperature source Oral, resp. rate 18, height 4\' 11"  (1.499 m), weight 72.6 kg, last menstrual period 04/09/2023, SpO2 100 %. Body mass index is 32.32 kg/m.   Treatment Plan Summary: Daily contact with patient to assess and evaluate symptoms and progress in treatment, Medication management, and Plan continue current medications.  Sarina Ill, DO 05/12/2023, 2:39 PM

## 2023-05-12 NOTE — Progress Notes (Addendum)
D - Patient visible in the milieu and pleasant upon contact.  Able to advocate for needs.  Aliz endorsed constipation and Milk of Mag with prune juice was provided with minimal effect.  Tylenol provided at 0100 for lower stomach discomfort.  The patient fell asleep soon after administration.    A - PRN medications provided as requested by the patient for anxiety, sleep, constipation, and pain.  Scheduled medications accepted as ordered.    R - No unsafe behaviors noted.  Mood was stable and the patient appropriately socialized with peers/staff.  Suicidal ideation denied.  Delusional thoughts well-managed.    ** Patient had an episode of emesis at 0400 and was provided PRN Zofran for nausea.  Will monitor and provide support.

## 2023-05-13 DIAGNOSIS — F332 Major depressive disorder, recurrent severe without psychotic features: Secondary | ICD-10-CM | POA: Diagnosis not present

## 2023-05-13 NOTE — [facility] Suicide Risk Assessment (Signed)
[facility] INPATIENT:  Family/Significant Other Suicide Prevention Education  Suicide Prevention Education:  Contact Attempts: Wandra Arthurs, Partner, [phone],  has been identified by the patient as the family member/significant other with whom the patient will be residing, and identified as the person(s) who will aid the patient in the event of a mental health crisis.  With written consent from the patient, two attempts were made to provide suicide prevention education, prior to and/or following the patient's discharge.  We were unsuccessful in providing suicide prevention education.  A suicide education pamphlet was given to the patient to share with family/significant other.  Date and time of first attempt:05/12/2023 1:00 pm  Date and time of second attempt:05/13/23/ 4:45 pm  Marshell Levan 05/13/2023, 4:49 PM

## 2023-05-13 NOTE — [facility] Group Notes (Signed)
[facility] Group Notes:  (Nursing/MHT/Case Management/Adjunct)  Date:  05/13/2023  Time:  3:14 PM  Type of Therapy:   activity-Courtyard  Participation Level:  Did Not Attend  Participation Quality:   na  Affect:   na  Cognitive:   na  Insight:  None  Engagement in Group:   na  Modes of Intervention:   na  Summary of Progress/Problems:  [name] 05/13/2023, 3:14 PM

## 2023-05-13 NOTE — [facility] Group Notes (Signed)
[facility] Group Notes:  (Nursing/MHT/Case Management/Adjunct)  Date:  05/13/2023  Time:  10:41 AM  Type of Therapy:  Psychoeducational Skills  Participation Level:  Did Not Attend  Participation Quality:   na  Affect:   na  Cognitive:   na  Insight:  None  Engagement in Group:   na  Modes of Intervention:   na  Summary of Progress/Problems:  [name] 05/13/2023, 10:41 AM

## 2023-05-13 NOTE — Progress Notes (Signed)
[facility] MD Progress Note  05/13/2023 2:19 PM [name]  MRN:  [id number] Subjective: [name] is seen on rounds.  Nurses report that she is still sick with vomiting and diarrhea.  We gave her 50 mg of Phenergan and she says that it was helpful. Principal Problem: MDD (major depressive disorder), recurrent episode, severe (HCC) Diagnosis: Principal Problem:   MDD (major depressive disorder), recurrent episode, severe (HCC) Active Problems:   Stimulant use disorder (HCC) (cocaine)   Alcohol use disorder, moderate, dependence (HCC)   Cocaine use disorder, moderate, dependence (HCC)  Total Time spent with patient: 15 minutes  Past Psychiatric History: Polysubstance abuse and depression  Past Medical History:  Past Medical History:  Diagnosis Date   Anxiety    Depression    Gestational diabetes    History of substance abuse (HCC)    History of suicide attempt    History of thyroid disease    Hypertension    Thyroid disease     Past Surgical History:  Procedure Laterality Date   CESAREAN SECTION  2000, 2002, 2013   WISDOM TOOTH EXTRACTION     Family History:  Family History  Problem Relation Age of Onset   Diabetes Mother    Family Psychiatric  History: Unremarkable Social History:  Social History   Substance and Sexual Activity  Alcohol Use Yes   Comment: ocassionlly     Social History   Substance and Sexual Activity  Drug Use Not Currently   Types: Cocaine, Marijuana   Comment: Positive UDS 12/2017, last used mj 3 days ago , last used cocaine  a couple of months ago     Social History   Socioeconomic History   Marital status: Single    Spouse name: Not on file   Number of children: Not on file   Years of education: Not on file   Highest education level: Not on file  Occupational History   Not on file  Tobacco Use   Smoking status: Former    Packs/day: .25    Types: Cigarettes   Smokeless tobacco: Never  Vaping Use   Vaping Use: Never used  Substance  and Sexual Activity   Alcohol use: Yes    Comment: ocassionlly   Drug use: Not Currently    Types: Cocaine, Marijuana    Comment: Positive UDS 12/2017, last used mj 3 days ago , last used cocaine  a couple of months ago    Sexual activity: Yes    Birth control/protection: None  Other Topics Concern   Not on file  Social History Narrative   Not on file   Social Determinants of Health   Financial Resource Strain: Not on file  Food Insecurity: Food Insecurity Present (05/07/2023)   Hunger Vital Sign    Worried About Running Out of Food in the Last Year: Often true    Ran Out of Food in the Last Year: Often true  Transportation Needs: Unmet Transportation Needs (05/07/2023)   PRAPARE - Administrator, Civil Service (Medical): Yes    Lack of Transportation (Non-Medical): Yes  Physical Activity: Not on file  Stress: Not on file  Social Connections: Not on file   Additional Social History:                         Sleep: Good  Appetite:  Good  Current Medications: Current Facility-Administered Medications  Medication Dose Route Frequency Provider Last Rate Last Admin  acetaminophen (TYLENOL) tablet 650 mg  650 mg Oral Q6H PRN Bennett, Christal H, NP   650 mg at 05/13/23 0826   alum & mag hydroxide-simeth (MAALOX/MYLANTA) 200-200-20 MG/5ML suspension 30 mL  30 mL Oral Q4H PRN Bennett, Christal H, NP   30 mL at 05/12/23 2206   docusate sodium (COLACE) capsule 100 mg  100 mg Oral BID Clapacs, John T, MD   100 mg at 05/12/23 0812   fluticasone (FLONASE) 50 MCG/ACT nasal spray 2 spray  2 spray Each Nare Daily PRN Clapacs, Jackquline Denmark, MD   2 spray at 05/12/23 1610   folic acid (FOLVITE) tablet 1 mg  1 mg Oral Daily Bennett, Christal H, NP   1 mg at 05/13/23 0754   gabapentin (NEURONTIN) capsule 300 mg  300 mg Oral Q6H PRN Clapacs, John T, MD   300 mg at 05/12/23 2207   hydrOXYzine (ATARAX) tablet 50 mg  50 mg Oral Q6H PRN Clapacs, Jackquline Denmark, MD   50 mg at 05/12/23 2134    loperamide (IMODIUM) capsule 2 mg  2 mg Oral PRN Sarina Ill, DO   2 mg at 05/13/23 9604   loratadine (CLARITIN) tablet 10 mg  10 mg Oral Daily Clapacs, Jackquline Denmark, MD   10 mg at 05/13/23 0753   magnesium hydroxide (MILK OF MAGNESIA) suspension 30 mL  30 mL Oral Daily PRN Willeen Cass, Christal H, NP   30 mL at 05/11/23 2255   multivitamin with minerals tablet 1 tablet  1 tablet Oral Daily Bennett, Christal H, NP   1 tablet at 05/13/23 0753   ondansetron (ZOFRAN-ODT) disintegrating tablet 4 mg  4 mg Oral Q8H PRN Clapacs, John T, MD   4 mg at 05/13/23 0500   promethazine (PHENERGAN) tablet 50 mg  50 mg Oral Q8H PRN Sarina Ill, DO   50 mg at 05/13/23 0800   QUEtiapine (SEROQUEL) tablet 100 mg  100 mg Oral QHS PRN Clapacs, Jackquline Denmark, MD   100 mg at 05/12/23 2134   sertraline (ZOLOFT) tablet 50 mg  50 mg Oral Daily Clapacs, Jackquline Denmark, MD   50 mg at 05/13/23 5409   thiamine (VITAMIN B1) tablet 100 mg  100 mg Oral Daily Bennett, Christal H, NP   100 mg at 05/13/23 0753   traZODone (DESYREL) tablet 100 mg  100 mg Oral QHS PRN Willeen Cass, Christal H, NP   100 mg at 05/12/23 2134    Lab Results:  Results for orders placed or performed during the hospital encounter of 05/07/23 (from the past 48 hour(s))  Gastrointestinal Panel by PCR , Stool     Status: None   Collection Time: 05/12/23  8:30 PM   Specimen: STOOL  Result Value Ref Range   Campylobacter species NOT DETECTED NOT DETECTED   Plesimonas shigelloides NOT DETECTED NOT DETECTED   Salmonella species NOT DETECTED NOT DETECTED   Yersinia enterocolitica NOT DETECTED NOT DETECTED   Vibrio species NOT DETECTED NOT DETECTED   Vibrio cholerae NOT DETECTED NOT DETECTED   Enteroaggregative E coli (EAEC) NOT DETECTED NOT DETECTED   Enteropathogenic E coli (EPEC) NOT DETECTED NOT DETECTED   Enterotoxigenic E coli (ETEC) NOT DETECTED NOT DETECTED   Shiga like toxin producing E coli (STEC) NOT DETECTED NOT DETECTED   Shigella/Enteroinvasive E coli  (EIEC) NOT DETECTED NOT DETECTED   Cryptosporidium NOT DETECTED NOT DETECTED   Cyclospora cayetanensis NOT DETECTED NOT DETECTED   Entamoeba histolytica NOT DETECTED NOT DETECTED   Giardia lamblia NOT  DETECTED NOT DETECTED   Adenovirus F40/41 NOT DETECTED NOT DETECTED   Astrovirus NOT DETECTED NOT DETECTED   Norovirus GI/GII NOT DETECTED NOT DETECTED   Rotavirus A NOT DETECTED NOT DETECTED   Sapovirus (I, II, IV, and V) NOT DETECTED NOT DETECTED    Comment: Performed at [facility], [address] [place], [place] [address]    Blood Alcohol level:  Lab Results  Component Value Date   ETH 190 (H) 05/07/2023   ETH <10 07/09/2022    Metabolic Disorder Labs: Lab Results  Component Value Date   HGBA1C 5.4 07/21/2020   MPG 108 07/21/2020   MPG 111.15 06/05/2020   No results found for: "PROLACTIN" Lab Results  Component Value Date   CHOL 218 (H) 01/02/2018   TRIG 187 (H) 01/02/2018   HDL 81 01/02/2018   CHOLHDL 2.7 01/02/2018   VLDL 37 01/02/2018   LDLCALC 100 (H) 01/02/2018   LDLCALC 82 01/12/2016    Physical Findings: AIMS:  , ,  ,  ,    CIWA:  CIWA-Ar Total: 1 COWS:     Musculoskeletal: Strength & Muscle Tone: within normal limits Gait & Station: normal Patient leans: N/A  Psychiatric Specialty Exam:  Presentation  General Appearance:  Disheveled  Eye Contact: Fair  Speech: Clear and Coherent  Speech Volume: Normal  Handedness: Right   Mood and Affect  Mood: Anxious; Depressed; Hopeless; Worthless  Affect: Depressed; Tearful   Thought Process  Thought Processes: Coherent  Descriptions of Associations:Intact  Orientation:Full (Time, Place and Person)  Thought Content:Logical  History of Schizophrenia/Schizoaffective disorder:No  Duration of Psychotic Symptoms:No data recorded Hallucinations:No data recorded Ideas of Reference:None  Suicidal Thoughts:No data recorded Homicidal Thoughts:No data recorded  Sensorium   Memory: Immediate Good; Recent Good  Judgment: Poor  Insight: Poor   Executive Functions  Concentration: Fair  Attention Span: Fair  Recall: Fair  Fund of Knowledge: Fair  Language: Fair   Psychomotor Activity  Psychomotor Activity:No data recorded  Assets  Assets: Desire for Improvement; Housing; Physical Health; Resilience   Sleep  Sleep:No data recorded    Blood pressure 131/76, pulse 66, temperature 98.1 F (36.7 C), temperature source Oral, resp. rate 18, height 4\' 11"  (1.499 m), weight 72.6 kg, last menstrual period 04/09/2023, SpO2 100 %. Body mass index is 32.32 kg/m.   Treatment Plan Summary: Daily contact with patient to assess and evaluate symptoms and progress in treatment, Medication management, and Plan continue current medications.  Sarina Ill, DO 05/13/2023, 2:19 PM

## 2023-05-13 NOTE — Progress Notes (Signed)
Patient presents with anxious mood, affect labile.  This am [name] continues to report feeling nauseated and with GI distress. She reports she had emesis in the early am, and diarrhea earlier this am.  Pt offered ginger ale, gatorade and given prn immodium and phernegan for continued nausea and diarrhea.   Despite this, she has still maintained voracious appetite and remains hyperfocused on her meal trays, reporting an issue with each meal tray she receives. She was observed to eat some of her pancakes and has been drinking gatorade and fluids well. [name] continues to display poor impulse control and becoming easily agitated when her needs are not immediately met, especially around food..She has been compliant with medications. Did report stomach pain from previous vomiting yesterday and this am, prn tylenol given with good results. Pt is safe, currently remains isolated to her room due to GI precautions.  Pt is safe. Will con't to monitor.

## 2023-05-13 NOTE — Progress Notes (Signed)
D - Patient isolated in her room due to Enteric Precautions.  Able to advocate for needs via phone.  Kessler endorsed intermittent diarrhea and vomiting.   She was provided a variety of PRN medications with minimal effect, however, symptoms appeared to improve as time passed.  Seroquel and trazodone provided for sleep, but the patient remained awake past 0200.  Stool sample results were negative.        A - PRN medications provided as requested by the patient for anxiety, sleep, nausea, and diarrhea.  Scheduled medications accepted as ordered.  Gatorade and fluids encouraged.   R - No unsafe behaviors noted.  Mood was stable and the patient appropriately communicated with staff.  Suicidal ideation denied.  Hopelessness and helplessness evident.  Food/fluids intake were minimal during the evening hours.

## 2023-05-13 NOTE — Plan of Care (Signed)

## 2023-05-14 ENCOUNTER — Inpatient Hospital Stay: Payer: Medicaid Other

## 2023-05-14 ENCOUNTER — Other Ambulatory Visit: Payer: Self-pay

## 2023-05-14 DIAGNOSIS — F332 Major depressive disorder, recurrent severe without psychotic features: Secondary | ICD-10-CM | POA: Diagnosis not present

## 2023-05-14 LAB — URINALYSIS, COMPLETE (UACMP) WITH MICROSCOPIC
Bacteria, UA: NONE SEEN
Bilirubin Urine: NEGATIVE
Glucose, UA: NEGATIVE mg/dL
Hgb urine dipstick: NEGATIVE
Ketones, ur: NEGATIVE mg/dL
Leukocytes,Ua: NEGATIVE
Nitrite: NEGATIVE
Protein, ur: NEGATIVE mg/dL
Specific Gravity, Urine: 1.019 (ref 1.005–1.030)
WBC, UA: NONE SEEN WBC/hpf (ref 0–5)
pH: 5 (ref 5.0–8.0)

## 2023-05-14 MED ORDER — QUETIAPINE FUMARATE 100 MG PO TABS
100.0000 mg | ORAL_TABLET | Freq: Every evening | ORAL | 0 refills | Status: AC | PRN
  Filled 2023-05-14: qty 10, 10d supply, fill #0

## 2023-05-14 MED ORDER — GABAPENTIN 300 MG PO CAPS: 300.0000 mg | ORAL_CAPSULE | Freq: Four times a day (QID) | ORAL | 1 refills | Status: DC | PRN

## 2023-05-14 MED ORDER — SERTRALINE HCL 50 MG PO TABS
50.0000 mg | ORAL_TABLET | Freq: Every day | ORAL | 0 refills | Status: AC
  Filled 2023-05-14: qty 10, 10d supply, fill #0

## 2023-05-14 MED ORDER — VITAMIN B-1 100 MG PO TABS
100.0000 mg | ORAL_TABLET | Freq: Every day | ORAL | 0 refills | Status: AC
  Filled 2023-05-14: qty 10, 10d supply, fill #0

## 2023-05-14 MED ORDER — TRAZODONE HCL 100 MG PO TABS: 100.0000 mg | ORAL_TABLET | Freq: Every evening | ORAL | 1 refills | Status: DC | PRN

## 2023-05-14 MED ORDER — QUETIAPINE FUMARATE 100 MG PO TABS: 100.0000 mg | ORAL_TABLET | Freq: Every evening | ORAL | 1 refills | Status: DC | PRN

## 2023-05-14 MED ORDER — VITAMIN B-1 100 MG PO TABS: 100.0000 mg | ORAL_TABLET | Freq: Every day | ORAL | 1 refills | Status: DC

## 2023-05-14 MED ORDER — TRAMADOL HCL 50 MG PO TABS
50.0000 mg | ORAL_TABLET | Freq: Four times a day (QID) | ORAL | Status: DC | PRN
  Administered 2023-05-14 – 2023-05-15 (×3): 50 mg via ORAL
  Filled 2023-05-14 (×3): qty 1

## 2023-05-14 MED ORDER — SERTRALINE HCL 50 MG PO TABS: 50.0000 mg | ORAL_TABLET | Freq: Every day | ORAL | 1 refills | Status: DC

## 2023-05-14 MED ORDER — TRAZODONE HCL 100 MG PO TABS
100.0000 mg | ORAL_TABLET | Freq: Every evening | ORAL | 0 refills | Status: AC | PRN
  Filled 2023-05-14: qty 10, 10d supply, fill #0

## 2023-05-14 MED ORDER — HYDROXYZINE HCL 50 MG PO TABS
50.0000 mg | ORAL_TABLET | Freq: Four times a day (QID) | ORAL | 0 refills | Status: AC | PRN
  Filled 2023-05-14: qty 10, 3d supply, fill #0

## 2023-05-14 MED ORDER — FLUTICASONE PROPIONATE 50 MCG/ACT NA SUSP
2.0000 | Freq: Every day | NASAL | 0 refills | Status: AC | PRN
  Filled 2023-05-14: qty 16, 30d supply, fill #0

## 2023-05-14 MED ORDER — LORATADINE 10 MG PO TABS: 10.0000 mg | ORAL_TABLET | Freq: Every day | ORAL | 1 refills | Status: DC

## 2023-05-14 MED ORDER — FLUTICASONE PROPIONATE 50 MCG/ACT NA SUSP: 2.0000 | Freq: Every day | NASAL | 1 refills | Status: DC | PRN

## 2023-05-14 MED ORDER — LORATADINE 10 MG PO TABS
10.0000 mg | ORAL_TABLET | Freq: Every day | ORAL | 0 refills | Status: AC
  Filled 2023-05-14: qty 10, 10d supply, fill #0

## 2023-05-14 MED ORDER — GABAPENTIN 300 MG PO CAPS
300.0000 mg | ORAL_CAPSULE | Freq: Four times a day (QID) | ORAL | 0 refills | Status: AC | PRN
  Filled 2023-05-14: qty 20, 5d supply, fill #0

## 2023-05-14 MED ORDER — HYDROXYZINE HCL 50 MG PO TABS: 50.0000 mg | ORAL_TABLET | Freq: Four times a day (QID) | ORAL | 1 refills | Status: DC | PRN

## 2023-05-14 MED ORDER — FAMOTIDINE 20 MG PO TABS
20.0000 mg | ORAL_TABLET | Freq: Every day | ORAL | Status: DC
  Administered 2023-05-14 – 2023-05-15 (×2): 20 mg via ORAL
  Filled 2023-05-14 (×2): qty 1

## 2023-05-14 NOTE — BH IP Treatment Plan (Signed)
Interdisciplinary Treatment and Diagnostic Plan Update  05/14/2023 Time of Session: 08:30 [name] MRN: [id number]  Principal Diagnosis: MDD (major depressive disorder), recurrent episode, severe (HCC)  Secondary Diagnoses: Principal Problem:   MDD (major depressive disorder), recurrent episode, severe (HCC) Active Problems:   Stimulant use disorder (HCC) (cocaine)   Alcohol use disorder, moderate, dependence (HCC)   Cocaine use disorder, moderate, dependence (HCC)   Current Medications:  Current Facility-Administered Medications  Medication Dose Route Frequency Provider Last Rate Last Admin   acetaminophen (TYLENOL) tablet 650 mg  650 mg Oral Q6H PRN Bennett, Christal H, NP   650 mg at 05/14/23 0548   alum & mag hydroxide-simeth (MAALOX/MYLANTA) 200-200-20 MG/5ML suspension 30 mL  30 mL Oral Q4H PRN Bennett, Christal H, NP   30 mL at 05/13/23 2339   docusate sodium (COLACE) capsule 100 mg  100 mg Oral BID Clapacs, John T, MD   100 mg at 05/14/23 0806   famotidine (PEPCID) tablet 20 mg  20 mg Oral Daily Clapacs, John T, MD   20 mg at 05/14/23 1042   fluticasone (FLONASE) 50 MCG/ACT nasal spray 2 spray  2 spray Each Nare Daily PRN Clapacs, Jackquline Denmark, MD   2 spray at 05/12/23 0918   folic acid (FOLVITE) tablet 1 mg  1 mg Oral Daily Bennett, Christal H, NP   1 mg at 05/14/23 0806   gabapentin (NEURONTIN) capsule 300 mg  300 mg Oral Q6H PRN Clapacs, John T, MD   300 mg at 05/13/23 2047   hydrOXYzine (ATARAX) tablet 50 mg  50 mg Oral Q6H PRN Clapacs, John T, MD   50 mg at 05/13/23 2047   loperamide (IMODIUM) capsule 2 mg  2 mg Oral PRN Sarina Ill, DO   2 mg at 05/13/23 0826   loratadine (CLARITIN) tablet 10 mg  10 mg Oral Daily Clapacs, John T, MD   10 mg at 05/14/23 0806   magnesium hydroxide (MILK OF MAGNESIA) suspension 30 mL  30 mL Oral Daily PRN Bennett, Christal H, NP   30 mL at 05/11/23 2255   multivitamin with minerals tablet 1 tablet  1 tablet Oral Daily Bennett,  Christal H, NP   1 tablet at 05/14/23 0806   ondansetron (ZOFRAN-ODT) disintegrating tablet 4 mg  4 mg Oral Q8H PRN Clapacs, John T, MD   4 mg at 05/13/23 2047   promethazine (PHENERGAN) tablet 50 mg  50 mg Oral Q8H PRN Sarina Ill, DO   50 mg at 05/13/23 0800   QUEtiapine (SEROQUEL) tablet 100 mg  100 mg Oral QHS PRN Clapacs, John T, MD   100 mg at 05/13/23 2048   sertraline (ZOLOFT) tablet 50 mg  50 mg Oral Daily Clapacs, John T, MD   50 mg at 05/14/23 2130   thiamine (VITAMIN B1) tablet 100 mg  100 mg Oral Daily Bennett, Christal H, NP   100 mg at 05/14/23 0806   traMADol (ULTRAM) tablet 50 mg  50 mg Oral Q6H PRN Clapacs, John T, MD   50 mg at 05/14/23 1042   traZODone (DESYREL) tablet 100 mg  100 mg Oral QHS PRN Willeen Cass, Christal H, NP   100 mg at 05/13/23 2047   PTA Medications: Medications Prior to Admission  Medication Sig Dispense Refill Last Dose   acyclovir (ZOVIRAX) 800 MG tablet Take 1 tablet (800 mg total) by mouth 2 (two) times daily. (Patient not taking: Reported on 05/07/2023) 10 tablet 11    albuterol (VENTOLIN  HFA) 108 (90 Base) MCG/ACT inhaler Inhale 2 puffs into the lungs every 6 (six) hours as needed for wheezing or shortness of breath. (Patient not taking: Reported on 05/07/2023) 8 g 2    hydrOXYzine (ATARAX) 25 MG tablet Take 1 tablet (25 mg total) by mouth 3 (three) times daily as needed for anxiety. (Patient not taking: Reported on 05/07/2023) 30 tablet 0    predniSONE (DELTASONE) 50 MG tablet Take 1 tablet (50 mg total) by mouth daily with breakfast. (Patient not taking: Reported on 05/07/2023) 4 tablet 0    sertraline (ZOLOFT) 50 MG tablet Take 1 tablet (50 mg total) by mouth daily. (Patient not taking: Reported on 05/07/2023) 30 tablet 1    traZODone (DESYREL) 100 MG tablet Take 1 tablet (100 mg total) by mouth at bedtime as needed for sleep. (Patient not taking: Reported on 05/07/2023) 30 tablet 1     Patient Stressors: Loss of mother   Marital or family  conflict   Substance abuse    Patient Strengths: Ability for insight  Active sense of humor  Average or above average intelligence  Capable of independent living  Communication skills  General fund of knowledge   Treatment Modalities: Medication Management, Group therapy, Case management,  1 to 1 session with clinician, Psychoeducation, Recreational therapy.   Physician Treatment Plan for Primary Diagnosis: MDD (major depressive disorder), recurrent episode, severe (HCC) Long Term Goal(s): Improvement in symptoms so as ready for discharge   Short Term Goals: Ability to disclose and discuss suicidal ideas Ability to verbalize feelings will improve  Medication Management: Evaluate patient's response, side effects, and tolerance of medication regimen.  Therapeutic Interventions: 1 to 1 sessions, Unit Group sessions and Medication administration.  Evaluation of Outcomes: Progressing  Physician Treatment Plan for Secondary Diagnosis: Principal Problem:   MDD (major depressive disorder), recurrent episode, severe (HCC) Active Problems:   Stimulant use disorder (HCC) (cocaine)   Alcohol use disorder, moderate, dependence (HCC)   Cocaine use disorder, moderate, dependence (HCC)  Long Term Goal(s): Improvement in symptoms so as ready for discharge   Short Term Goals: Ability to disclose and discuss suicidal ideas Ability to verbalize feelings will improve     Medication Management: Evaluate patient's response, side effects, and tolerance of medication regimen.  Therapeutic Interventions: 1 to 1 sessions, Unit Group sessions and Medication administration.  Evaluation of Outcomes: Progressing   RN Treatment Plan for Primary Diagnosis: MDD (major depressive disorder), recurrent episode, severe (HCC) Long Term Goal(s): Knowledge of disease and therapeutic regimen to maintain health will improve  Short Term Goals: Ability to remain free from injury will improve, Ability to verbalize  frustration and anger appropriately will improve, Ability to demonstrate self-control, Ability to participate in decision making will improve, Ability to verbalize feelings will improve, Ability to disclose and discuss suicidal ideas, Ability to identify and develop effective coping behaviors will improve, and Compliance with prescribed medications will improve  Medication Management: RN will administer medications as ordered by provider, will assess and evaluate patient's response and provide education to patient for prescribed medication. RN will report any adverse and/or side effects to prescribing provider.  Therapeutic Interventions: 1 on 1 counseling sessions, Psychoeducation, Medication administration, Evaluate responses to treatment, Monitor vital signs and CBGs as ordered, Perform/monitor CIWA, COWS, AIMS and Fall Risk screenings as ordered, Perform wound care treatments as ordered.  Evaluation of Outcomes: Progressing   LCSW Treatment Plan for Primary Diagnosis: MDD (major depressive disorder), recurrent episode, severe (HCC) Long Term Goal(s): Safe  transition to appropriate next level of care at discharge, Engage patient in therapeutic group addressing interpersonal concerns.  Short Term Goals: Engage patient in aftercare planning with referrals and resources, Increase social support, Increase ability to appropriately verbalize feelings, Increase emotional regulation, Facilitate acceptance of mental health diagnosis and concerns, Facilitate patient progression through stages of change regarding substance use diagnoses and concerns, Identify triggers associated with mental health/substance abuse issues, and Increase skills for wellness and recovery  Therapeutic Interventions: Assess for all discharge needs, 1 to 1 time with Social worker, Explore available resources and support systems, Assess for adequacy in community support network, Educate family and significant other(s) on suicide  prevention, Complete Psychosocial Assessment, Interpersonal group therapy.  Evaluation of Outcomes: Progressing   Progress in Treatment: Attending groups: No. Participating in groups: No. Taking medication as prescribed: Yes. Toleration medication: Yes. Family/Significant other contact made: Yes, individual(s) contacted:  partner, Wandra Arthurs. Patient understands diagnosis: No. Discussing patient identified problems/goals with staff: Yes. Medical problems stabilized or resolved: Yes. Denies suicidal/homicidal ideation: No. Issues/concerns per patient self-inventory: No. Other: none.  New problem(s) identified: No, Describe:  none 05/14/23 Update: No changes at this time.   New Short Term/Long Term Goal(s): Patient to work towards detox, elimination of symptoms of psychosis, medication management for mood stabilization; elimination of SI thoughts; development of comprehensive mental wellness/sobriety plan. 05/14/23 Update: No changes at this time.   Patient Goals: patient refused to participate in treatment team meeting. 05/14/23 Update: No changes at this time.        Discharge Plan or Barriers: No psychosocial barriers identified at this time, patient to return to place of residence when appropriate for discharge. 05/14/23 Update: Pt was experiencing gastrointestinal issues this weekend and put on enteric precautions. No other issues present.     Reason for Continuation of Hospitalization: Depression Medication stabilization   Estimated Length of Stay: 1-7 days 05/14/23 Update: No changes at this time.  Last 3 [place] Suicide Severity Risk Score: Flowsheet Row Admission (Current) from 05/07/2023 in [facility]PAT[facility]INE Most recent reading at 05/12/2023  7:50 AM ED from 05/07/2023 in [facility] Emergency Department at [facility] Most recent reading at 05/07/2023  1:28 PM ED from 04/20/2023 in [facility] Emergency Department at [facility] Most recent reading  at 04/20/2023  1:43 PM  C-SSRS RISK CATEGORY No Risk High Risk No Risk       Last PHQ 2/9 Scores:    11/26/2020    2:10 PM  Depression screen PHQ 2/9  Decreased Interest 0  Down, Depressed, Hopeless 0  PHQ - 2 Score 0    Scribe for Treatment Team: Glenis Smoker, LCSW 05/14/2023 10:48 AM

## 2023-05-14 NOTE — Group Note (Signed)
Date:  05/14/2023 Time:  8:59 PM  Group Topic/Focus:  Wrap-Up Group:   The focus of this group is to help patients review their daily goal of treatment and discuss progress on daily workbooks.    Participation Level:  Active  Participation Quality:  Intrusive, Inattentive, and Monopolizing  Affect:  Blunted, Defensive, Excited, Irritable, and Not Congruent  Cognitive:  Alert and Disorganized  Insight: Lacking  Engagement in Group:  Distracting, Lacking, and Monopolizing  Modes of Intervention:  Discussion and Education  Additional Comments:     Maglione,Lyrika Souders E 05/14/2023, 8:59 PM

## 2023-05-14 NOTE — Progress Notes (Signed)
   05/13/23 1957  Psych Admission Type (Psych Patients Only)  Admission Status Voluntary  Psychosocial Assessment  Patient Complaints Anxiety  Eye Contact Fair  Facial Expression Flat;Sad  Affect Anxious  Speech Soft  Interaction Demanding;Needy  Motor Activity Slow  Appearance/Hygiene Improved  Behavior Characteristics Irritable  Mood Labile  Thought Process  Coherency Circumstantial  Content WDL  Delusions WDL  Perception WDL  Hallucination None reported or observed  Judgment Impaired  Confusion None  Danger to Self  Current suicidal ideation? Denies  Agreement Not to Harm Self Yes  Description of Agreement Verbal  Danger to Others  Danger to Others None reported or observed   Patient c/o abd pain stated she has no Psych issues she does not need to be on this unit. Denies N/V this am has been irritable stated she wants to be discharged she is tired being on isolation and wants to go home. Support and encouragement provided. Compliant with all medications no adverse effects noted.

## 2023-05-14 NOTE — [facility] Group Notes (Signed)
[facility] Group Notes:  (Nursing/MHT/Case Management/Adjunct)  Date:  05/14/2023  Time:  9:53 AM  Type of Therapy:   community meeting  Participation Level:  Did Not Attend    Rodena Goldmann 05/14/2023, 9:53 AM

## 2023-05-14 NOTE — Group Note (Signed)
Recreation Therapy Group Note   Group Topic:Goal Setting  Group Date: 05/14/2023 Start Time: 1000 End Time: 1100 Facilitators: Rosina Lowenstein, LRT, CTRS Location:  Craft Room  Group Description: Vision Board. Patients were given many different magazines, a glue stick, markers, and a piece of cardstock paper. LRT and pts discussed the importance of having goals in life. LRT and pts discussed the difference between short-term and long-term goals, as well as what a SMART goal is. LRT encouraged pts to create a vision board, with images they picked and then cut out with safety scissors from the magazine, for themselves, that capture their short and long-term goals. LRT encouraged pts to show and explain their vision board to the group. LRT offered to laminate vision board once dry and complete.   Goal Area(s) Addressed:  Patient will gain knowledge of short vs. long term goals.  Patient will identify goals for themselves. Patient will practice setting SMART goals. Patient will verbalize their goals to LRT and peers.  Affect/Mood: N/A   Participation Level: Did not attend    Clinical Observations/Individualized Feedback: [name] did not attend group.  Plan: Continue to engage patient in RT group sessions 2-3x/week.   Rosina Lowenstein, LRT, CTRS 05/14/2023 11:26 AM

## 2023-05-14 NOTE — Progress Notes (Signed)
[facility] MD Progress Note  05/14/2023 1:40 PM [name]  MRN:  [id number] Subjective: Follow-up for this 37 year old woman.  Patient seen and chart reviewed.  Spoke with nursing.  Patient had some episodes of diarrhea nausea over the weekend which resulted in her being put on enteric precautions.  GI panel however came back as negative so infection control his let us know we can stop that.  Patient says she is still having abdominal pain but not having diarrhea or vomiting today.  She says that in the past this sort of pain has been associated with kidney stones.  I got a KUB and it was negative for anything.  Other labs still pending.  Vital stable.  Overall mood is doing well.  Denies suicidal thought. Principal Problem: MDD (major depressive disorder), recurrent episode, severe (HCC) Diagnosis: Principal Problem:   MDD (major depressive disorder), recurrent episode, severe (HCC) Active Problems:   Stimulant use disorder (HCC) (cocaine)   Alcohol use disorder, moderate, dependence (HCC)   Cocaine use disorder, moderate, dependence (HCC)  Total Time spent with patient: 30 minutes  Past Psychiatric History: Patient with a long history of substance abuse and mood symptoms mood instability  Past Medical History:  Past Medical History:  Diagnosis Date   Anxiety    Depression    Gestational diabetes    History of substance abuse (HCC)    History of suicide attempt    History of thyroid disease    Hypertension    Thyroid disease     Past Surgical History:  Procedure Laterality Date   CESAREAN SECTION  2000, 2002, 2013   WISDOM TOOTH EXTRACTION     Family History:  Family History  Problem Relation Age of Onset   Diabetes Mother    Family Psychiatric  History: See previous Social History:  Social History   Substance and Sexual Activity  Alcohol Use Yes   Comment: ocassionlly     Social History   Substance and Sexual Activity  Drug Use Not Currently   Types: Cocaine,  Marijuana   Comment: Positive UDS 12/2017, last used mj 3 days ago , last used cocaine  a couple of months ago     Social History   Socioeconomic History   Marital status: Single    Spouse name: Not on file   Number of children: Not on file   Years of education: Not on file   Highest education level: Not on file  Occupational History   Not on file  Tobacco Use   Smoking status: Former    Packs/day: .25    Types: Cigarettes   Smokeless tobacco: Never  Vaping Use   Vaping Use: Never used  Substance and Sexual Activity   Alcohol use: Yes    Comment: ocassionlly   Drug use: Not Currently    Types: Cocaine, Marijuana    Comment: Positive UDS 12/2017, last used mj 3 days ago , last used cocaine  a couple of months ago    Sexual activity: Yes    Birth control/protection: None  Other Topics Concern   Not on file  Social History Narrative   Not on file   Social Determinants of Health   Financial Resource Strain: Not on file  Food Insecurity: Food Insecurity Present (05/07/2023)   Hunger Vital Sign    Worried About Running Out of Food in the Last Year: Often true    Ran Out of Food in the Last Year: Often true  Transportation Needs: Unmet  Transportation Needs (05/07/2023)   PRAPARE - Administrator, Civil Service (Medical): Yes    Lack of Transportation (Non-Medical): Yes  Physical Activity: Not on file  Stress: Not on file  Social Connections: Not on file   Additional Social History:                         Sleep: Fair  Appetite:  Fair  Current Medications: Current Facility-Administered Medications  Medication Dose Route Frequency Provider Last Rate Last Admin   acetaminophen (TYLENOL) tablet 650 mg  650 mg Oral Q6H PRN Bennett, Christal H, NP   650 mg at 05/14/23 0548   alum & mag hydroxide-simeth (MAALOX/MYLANTA) 200-200-20 MG/5ML suspension 30 mL  30 mL Oral Q4H PRN Bennett, Christal H, NP   30 mL at 05/13/23 2339   docusate sodium (COLACE)  capsule 100 mg  100 mg Oral BID Harika Laidlaw T, MD   100 mg at 05/14/23 0806   famotidine (PEPCID) tablet 20 mg  20 mg Oral Daily Jasher Barkan T, MD   20 mg at 05/14/23 1042   fluticasone (FLONASE) 50 MCG/ACT nasal spray 2 spray  2 spray Each Nare Daily PRN Khrystian Schauf, Jackquline Denmark, MD   2 spray at 05/12/23 0918   folic acid (FOLVITE) tablet 1 mg  1 mg Oral Daily Bennett, Christal H, NP   1 mg at 05/14/23 0806   gabapentin (NEURONTIN) capsule 300 mg  300 mg Oral Q6H PRN Sehaj Mcenroe T, MD   300 mg at 05/13/23 2047   hydrOXYzine (ATARAX) tablet 50 mg  50 mg Oral Q6H PRN Teshaun Olarte T, MD   50 mg at 05/14/23 1151   loperamide (IMODIUM) capsule 2 mg  2 mg Oral PRN Sarina Ill, DO   2 mg at 05/13/23 0826   loratadine (CLARITIN) tablet 10 mg  10 mg Oral Daily Jessieca Rhem T, MD   10 mg at 05/14/23 0806   magnesium hydroxide (MILK OF MAGNESIA) suspension 30 mL  30 mL Oral Daily PRN Bennett, Christal H, NP   30 mL at 05/11/23 2255   multivitamin with minerals tablet 1 tablet  1 tablet Oral Daily Bennett, Christal H, NP   1 tablet at 05/14/23 0806   ondansetron (ZOFRAN-ODT) disintegrating tablet 4 mg  4 mg Oral Q8H PRN Shakirra Buehler T, MD   4 mg at 05/13/23 2047   promethazine (PHENERGAN) tablet 50 mg  50 mg Oral Q8H PRN Sarina Ill, DO   50 mg at 05/13/23 0800   QUEtiapine (SEROQUEL) tablet 100 mg  100 mg Oral QHS PRN Clarinda Obi T, MD   100 mg at 05/13/23 2048   sertraline (ZOLOFT) tablet 50 mg  50 mg Oral Daily Raffaele Derise T, MD   50 mg at 05/14/23 5784   thiamine (VITAMIN B1) tablet 100 mg  100 mg Oral Daily Bennett, Christal H, NP   100 mg at 05/14/23 0806   traMADol (ULTRAM) tablet 50 mg  50 mg Oral Q6H PRN Hayzen Lorenson T, MD   50 mg at 05/14/23 1042   traZODone (DESYREL) tablet 100 mg  100 mg Oral QHS PRN Willeen Cass, Christal H, NP   100 mg at 05/13/23 2047    Lab Results:  Results for orders placed or performed during the hospital encounter of 05/07/23 (from the past 48  hour(s))  Gastrointestinal Panel by PCR , Stool     Status: None   Collection Time: 05/12/23  8:30 PM   Specimen: STOOL  Result Value Ref Range   Campylobacter species NOT DETECTED NOT DETECTED   Plesimonas shigelloides NOT DETECTED NOT DETECTED   Salmonella species NOT DETECTED NOT DETECTED   Yersinia enterocolitica NOT DETECTED NOT DETECTED   Vibrio species NOT DETECTED NOT DETECTED   Vibrio cholerae NOT DETECTED NOT DETECTED   Enteroaggregative E coli (EAEC) NOT DETECTED NOT DETECTED   Enteropathogenic E coli (EPEC) NOT DETECTED NOT DETECTED   Enterotoxigenic E coli (ETEC) NOT DETECTED NOT DETECTED   Shiga like toxin producing E coli (STEC) NOT DETECTED NOT DETECTED   Shigella/Enteroinvasive E coli (EIEC) NOT DETECTED NOT DETECTED   Cryptosporidium NOT DETECTED NOT DETECTED   Cyclospora cayetanensis NOT DETECTED NOT DETECTED   Entamoeba histolytica NOT DETECTED NOT DETECTED   Giardia lamblia NOT DETECTED NOT DETECTED   Adenovirus F40/41 NOT DETECTED NOT DETECTED   Astrovirus NOT DETECTED NOT DETECTED   Norovirus GI/GII NOT DETECTED NOT DETECTED   Rotavirus A NOT DETECTED NOT DETECTED   Sapovirus (I, II, IV, and V) NOT DETECTED NOT DETECTED    Comment: Performed at [facility], [address] [place], [place] [address]    Blood Alcohol level:  Lab Results  Component Value Date   ETH 190 (H) 05/07/2023   ETH <10 07/09/2022    Metabolic Disorder Labs: Lab Results  Component Value Date   HGBA1C 5.4 07/21/2020   MPG 108 07/21/2020   MPG 111.15 06/05/2020   No results found for: "PROLACTIN" Lab Results  Component Value Date   CHOL 218 (H) 01/02/2018   TRIG 187 (H) 01/02/2018   HDL 81 01/02/2018   CHOLHDL 2.7 01/02/2018   VLDL 37 01/02/2018   LDLCALC 100 (H) 01/02/2018   LDLCALC 82 01/12/2016    Physical Findings: AIMS:  , ,  ,  ,    CIWA:  CIWA-Ar Total: 1 COWS:     Musculoskeletal: Strength & Muscle Tone: within normal limits Gait & Station:  normal Patient leans: N/A  Psychiatric Specialty Exam:  Presentation  General Appearance:  Disheveled  Eye Contact: Fair  Speech: Clear and Coherent  Speech Volume: Normal  Handedness: Right   Mood and Affect  Mood: Anxious; Depressed; Hopeless; Worthless  Affect: Depressed; Tearful   Thought Process  Thought Processes: Coherent  Descriptions of Associations:Intact  Orientation:Full (Time, Place and Person)  Thought Content:Logical  History of Schizophrenia/Schizoaffective disorder:No  Duration of Psychotic Symptoms:No data recorded Hallucinations:No data recorded Ideas of Reference:None  Suicidal Thoughts:No data recorded Homicidal Thoughts:No data recorded  Sensorium  Memory: Immediate Good; Recent Good  Judgment: Poor  Insight: Poor   Executive Functions  Concentration: Fair  Attention Span: Fair  Recall: Fair  Fund of Knowledge: Fair  Language: Fair   Psychomotor Activity  Psychomotor Activity:No data recorded  Assets  Assets: Desire for Improvement; Housing; Physical Health; Resilience   Sleep  Sleep:No data recorded   Physical Exam: Physical Exam Vitals and nursing note reviewed.  Constitutional:      Appearance: Normal appearance.  HENT:     Head: Normocephalic and atraumatic.     Mouth/Throat:     Pharynx: Oropharynx is clear.  Eyes:     Pupils: Pupils are equal, round, and reactive to light.  Cardiovascular:     Rate and Rhythm: Normal rate and regular rhythm.  Pulmonary:     Effort: Pulmonary effort is normal.     Breath sounds: Normal breath sounds.  Abdominal:     General: Abdomen  is flat.     Palpations: Abdomen is soft.  Musculoskeletal:        General: Normal range of motion.  Skin:    General: Skin is warm and dry.  Neurological:     General: No focal deficit present.     Mental Status: She is alert. Mental status is at baseline.  Psychiatric:        Attention and Perception: Attention  normal.        Mood and Affect: Mood normal.        Speech: Speech normal.        Behavior: Behavior normal.        Thought Content: Thought content normal.        Cognition and Memory: Cognition normal.    Review of Systems  Constitutional: Negative.   HENT: Negative.    Eyes: Negative.   Respiratory: Negative.    Cardiovascular: Negative.   Gastrointestinal:  Positive for abdominal pain, diarrhea and vomiting.  Musculoskeletal: Negative.   Skin: Negative.   Neurological: Negative.   Psychiatric/Behavioral: Negative.     Blood pressure 120/63, pulse 61, temperature 98.1 F (36.7 C), temperature source Oral, resp. rate 18, height 4\' 11"  (1.499 m), weight 72.6 kg, last menstrual period 04/09/2023, SpO2 100 %. Body mass index is 32.32 kg/m.   Treatment Plan Summary: Plan so far labs are unremarkable.  Patient is complaining of abdominal pain but was able to eat today does not appear to be in extreme distress.  Not going to get any other consults right now.  Labs pending.  We are going to tentatively plan on likely psychiatric discharge tomorrow.  Mordecai Rasmussen, MD 05/14/2023, 1:40 PM

## 2023-05-14 NOTE — Progress Notes (Signed)
Patient came to nurses station for anxiety medicine. When asked for the reason for anxiety patient states " my x ray report is not ready. I am anxious about that. I need my anxiety medicine." Vistaril given. Patient verbalized no nausea or diarrhea today. Appetite and energy level good. Patient out of enteric precautions now. Patient denies SI,HI and AVH. Support and encouragement given.

## 2023-05-14 NOTE — Group Note (Signed)
Date:  05/14/2023 Time:  6:04 PM  Group Topic/Focus:  Outdoor Recreation    Participation Level:  Did Not Attend  Lurleen Soltero Travis Shareka Casale 05/14/2023, 6:04 PM  

## 2023-05-15 ENCOUNTER — Other Ambulatory Visit: Payer: Self-pay

## 2023-05-15 DIAGNOSIS — F332 Major depressive disorder, recurrent severe without psychotic features: Secondary | ICD-10-CM | POA: Diagnosis not present

## 2023-05-15 LAB — CBC WITH DIFFERENTIAL/PLATELET
Abs Immature Granulocytes: 0.01 10*3/uL (ref 0.00–0.07)
Basophils Absolute: 0 10*3/uL (ref 0.0–0.1)
Basophils Relative: 0 %
Eosinophils Absolute: 0.7 10*3/uL — ABNORMAL HIGH (ref 0.0–0.5)
Eosinophils Relative: 11 %
HCT: 35.6 % — ABNORMAL LOW (ref 36.0–46.0)
Hemoglobin: 11.4 g/dL — ABNORMAL LOW (ref 12.0–15.0)
Immature Granulocytes: 0 %
Lymphocytes Relative: 41 %
Lymphs Abs: 2.4 10*3/uL (ref 0.7–4.0)
MCH: 28.9 pg (ref 26.0–34.0)
MCHC: 32 g/dL (ref 30.0–36.0)
MCV: 90.4 fL (ref 80.0–100.0)
Monocytes Absolute: 0.4 10*3/uL (ref 0.1–1.0)
Monocytes Relative: 7 %
Neutro Abs: 2.4 10*3/uL (ref 1.7–7.7)
Neutrophils Relative %: 41 %
Platelets: 182 10*3/uL (ref 150–400)
RBC: 3.94 MIL/uL (ref 3.87–5.11)
RDW: 13.2 % (ref 11.5–15.5)
WBC: 5.9 10*3/uL (ref 4.0–10.5)
nRBC: 0 % (ref 0.0–0.2)

## 2023-05-15 LAB — COMPREHENSIVE METABOLIC PANEL
ALT: 13 U/L (ref 0–44)
AST: 15 U/L (ref 15–41)
Albumin: 3.8 g/dL (ref 3.5–5.0)
Alkaline Phosphatase: 53 U/L (ref 38–126)
Anion gap: 7 (ref 5–15)
BUN: 12 mg/dL (ref 6–20)
CO2: 25 mmol/L (ref 22–32)
Calcium: 8.7 mg/dL — ABNORMAL LOW (ref 8.9–10.3)
Chloride: 105 mmol/L (ref 98–111)
Creatinine, Ser: 0.77 mg/dL (ref 0.44–1.00)
GFR, Estimated: >60 mL/min (ref >60)
Glucose, Bld: 131 mg/dL — ABNORMAL HIGH (ref 70–99)
Potassium: 3.6 mmol/L (ref 3.5–5.1)
Sodium: 137 mmol/L (ref 135–145)
Total Bilirubin: 0.5 mg/dL (ref 0.3–1.2)
Total Protein: 7.2 g/dL (ref 6.5–8.1)

## 2023-05-15 NOTE — Progress Notes (Signed)
Pt behavior was intrusive but cooperative.  Denies SI HI AVH.  Still insisting she needs to go home.  Compliant with medication and requested PRN for sleep and anxiety.  Continued monitoring for safety.   05/14/23 2100  Psych Admission Type (Psych Patients Only)  Admission Status Voluntary  Psychosocial Assessment  Patient Complaints Isolation  Eye Contact Fair  Facial Expression Fixed smile  Affect Appropriate to circumstance  Speech Logical/coherent  Interaction Assertive  Motor Activity Slow  Appearance/Hygiene Unremarkable  Behavior Characteristics Intrusive  Mood Pleasant  Thought Process  Coherency WDL  Content Blaming others  Delusions None reported or observed  Perception WDL  Hallucination None reported or observed  Judgment Impaired  Danger to Self  Current suicidal ideation? Denies  Danger to Others  Danger to Others None reported or observed

## 2023-05-15 NOTE — Progress Notes (Signed)
Discharge note: Pt refused suicide safety plan and survey. RN met with pt and reviewed pt's discharge instructions. Pt verbalized understanding of discharge instructions and pt did not have any questions. RN reviewed and provided pt with a copy of SRA, AVS and Transition Record. RN returned pt's belongings to pt. Prescriptions and samples were given to pt. Pt denied SI/HI/AVH and voiced no concerns. Pt was appreciative of the care pt received at [facility]. Patient discharged to the lobby without incident.  05/15/23 0827  Psych Admission Type (Psych Patients Only)  Admission Status Voluntary  Psychosocial Assessment  Patient Complaints None  Eye Contact Fair  Facial Expression Animated  Affect Appropriate to circumstance  Speech Logical/coherent;Loud  Interaction Assertive;Attention-seeking  Motor Activity Other (Comment) (WDL)  Appearance/Hygiene Unremarkable  Behavior Characteristics Cooperative;Appropriate to situation;Hyperactive  Mood Euphoric  Aggressive Behavior  Effect No apparent injury  Thought Process  Coherency WDL  Content Blaming others  Delusions None reported or observed  Perception WDL  Hallucination None reported or observed  Judgment Impaired  Confusion None  Danger to Self  Current suicidal ideation? Denies  Danger to Others  Danger to Others None reported or observed

## 2023-05-15 NOTE — Discharge Summary (Signed)
Physician Discharge Summary Note  Patient:  [name] is an 37 y.o., female MRN:  [id number] DOB:  04/17/1986 Patient phone:  [phone] (home)  Patient address:   [address] [place] [address],  Total Time spent with patient: 30 minutes  Date of Admission:  05/07/2023 Date of Discharge: 05/15/2023  Reason for Admission: Patient was admitted after becoming very depressed again with suicidal ideation in the context of substance abuse.  Principal Problem: MDD (major depressive disorder), recurrent episode, severe (HCC) Discharge Diagnoses: Principal Problem:   MDD (major depressive disorder), recurrent episode, severe (HCC) Active Problems:   Stimulant use disorder (HCC) (cocaine)   Alcohol use disorder, moderate, dependence (HCC)   Cocaine use disorder, moderate, dependence (HCC)   Past Psychiatric History: History of recurrent episodes of depression with suicidality with ongoing alcohol abuse and cocaine abuse  Past Medical History:  Past Medical History:  Diagnosis Date   Anxiety    Depression    Gestational diabetes    History of substance abuse (HCC)    History of suicide attempt    History of thyroid disease    Hypertension    Thyroid disease     Past Surgical History:  Procedure Laterality Date   CESAREAN SECTION  2000, 2002, 2013   WISDOM TOOTH EXTRACTION     Family History:  Family History  Problem Relation Age of Onset   Diabetes Mother    Family Psychiatric  History: See previous Social History:  Social History   Substance and Sexual Activity  Alcohol Use Yes   Comment: ocassionlly     Social History   Substance and Sexual Activity  Drug Use Not Currently   Types: Cocaine, Marijuana   Comment: Positive UDS 12/2017, last used mj 3 days ago , last used cocaine  a couple of months ago     Social History   Socioeconomic History   Marital status: Single    Spouse name: Not on file   Number of children: Not on file   Years  of education: Not on file   Highest education level: Not on file  Occupational History   Not on file  Tobacco Use   Smoking status: Former    Packs/day: .25    Types: Cigarettes   Smokeless tobacco: Never  Vaping Use   Vaping Use: Never used  Substance and Sexual Activity   Alcohol use: Yes    Comment: ocassionlly   Drug use: Not Currently    Types: Cocaine, Marijuana    Comment: Positive UDS 12/2017, last used mj 3 days ago , last used cocaine  a couple of months ago    Sexual activity: Yes    Birth control/protection: None  Other Topics Concern   Not on file  Social History Narrative   Not on file   Social Determinants of Health   Financial Resource Strain: Not on file  Food Insecurity: Food Insecurity Present (05/07/2023)   Hunger Vital Sign    Worried About Running Out of Food in the Last Year: Often true    Ran Out of Food in the Last Year: Often true  Transportation Needs: Unmet Transportation Needs (05/07/2023)   PRAPARE - Administrator, Civil Service (Medical): Yes    Lack of Transportation (Non-Medical): Yes  Physical Activity: Not on file  Stress: Not on file  Social Connections: Not on file    Hospital Course: Patient was started back on medication and also monitored and treated  for detox.  Had several days of being very withdrawn and then a couple days over the weekend of being sick to her stomach with diarrhea but everything physically has cleared up at this point.  Patient is denying any suicidal ideation and today her mood and affect are upbeat.  She and I had a talk about how she has been trying to change things in her life for years and has not succeeded.  Reminded her of that today and reminded her that following up with therapy medication mental health treatment and actually engaging in attempts to stay off alcohol could make a big difference for her.  At the time of discharge no sign of acute dangerousness or thought disorder.  Physical  Findings: AIMS:  , ,  ,  ,    CIWA:  CIWA-Ar Total: 1 COWS:     Musculoskeletal: Strength & Muscle Tone: within normal limits Gait & Station: normal Patient leans: N/A   Psychiatric Specialty Exam:  Presentation  General Appearance:  Disheveled  Eye Contact: Fair  Speech: Clear and Coherent  Speech Volume: Normal  Handedness: Right   Mood and Affect  Mood: Anxious; Depressed; Hopeless; Worthless  Affect: Depressed; Tearful   Thought Process  Thought Processes: Coherent  Descriptions of Associations:Intact  Orientation:Full (Time, Place and Person)  Thought Content:Logical  History of Schizophrenia/Schizoaffective disorder:No  Duration of Psychotic Symptoms:No data recorded Hallucinations:No data recorded Ideas of Reference:None  Suicidal Thoughts:No data recorded Homicidal Thoughts:No data recorded  Sensorium  Memory: Immediate Good; Recent Good  Judgment: Poor  Insight: Poor   Executive Functions  Concentration: Fair  Attention Span: Fair  Recall: Fair  Fund of Knowledge: Fair  Language: Fair   Psychomotor Activity  Psychomotor Activity:No data recorded  Assets  Assets: Desire for Improvement; Housing; Physical Health; Resilience   Sleep  Sleep:No data recorded   Physical Exam: Physical Exam Vitals and nursing note reviewed.  Constitutional:      Appearance: Normal appearance.  HENT:     Head: Normocephalic and atraumatic.     Mouth/Throat:     Pharynx: Oropharynx is clear.  Eyes:     Pupils: Pupils are equal, round, and reactive to light.  Cardiovascular:     Rate and Rhythm: Normal rate and regular rhythm.  Pulmonary:     Effort: Pulmonary effort is normal.     Breath sounds: Normal breath sounds.  Abdominal:     General: Abdomen is flat.     Palpations: Abdomen is soft.  Musculoskeletal:        General: Normal range of motion.  Skin:    General: Skin is warm and dry.  Neurological:      General: No focal deficit present.     Mental Status: She is alert. Mental status is at baseline.  Psychiatric:        Attention and Perception: Attention normal.        Mood and Affect: Mood normal.        Speech: Speech normal.        Behavior: Behavior is cooperative.        Thought Content: Thought content normal.        Cognition and Memory: Cognition normal.        Judgment: Judgment is impulsive.    Review of Systems  Constitutional: Negative.   HENT: Negative.    Eyes: Negative.   Respiratory: Negative.    Cardiovascular: Negative.   Gastrointestinal: Negative.   Musculoskeletal: Negative.   Skin: Negative.  Neurological: Negative.   Psychiatric/Behavioral: Negative.     Blood pressure 127/63, pulse (!) 55, temperature 97.6 F (36.4 C), temperature source Oral, resp. rate 19, height 4\' 11"  (1.499 m), weight 72.6 kg, last menstrual period 04/09/2023, SpO2 100 %. Body mass index is 32.32 kg/m.   Social History   Tobacco Use  Smoking Status Former   Packs/day: .25   Types: Cigarettes  Smokeless Tobacco Never   Tobacco Cessation:  A prescription for an FDA-approved tobacco cessation medication was offered at discharge and the patient refused   Blood Alcohol level:  Lab Results  Component Value Date   ETH 190 (H) 05/07/2023   ETH <10 07/09/2022    Metabolic Disorder Labs:  Lab Results  Component Value Date   HGBA1C 5.4 07/21/2020   MPG 108 07/21/2020   MPG 111.15 06/05/2020   No results found for: "PROLACTIN" Lab Results  Component Value Date   CHOL 218 (H) 01/02/2018   TRIG 187 (H) 01/02/2018   HDL 81 01/02/2018   CHOLHDL 2.7 01/02/2018   VLDL 37 01/02/2018   LDLCALC 100 (H) 01/02/2018   LDLCALC 82 01/12/2016    See Psychiatric Specialty Exam and Suicide Risk Assessment completed by Attending Physician prior to discharge.  Discharge destination:  Home  Is patient on multiple antipsychotic therapies at discharge:  No   Has Patient had three  or more failed trials of antipsychotic monotherapy by history:  No  Recommended Plan for Multiple Antipsychotic Therapies: NA  Discharge Instructions     Diet - low sodium heart healthy   Complete by: As directed    Increase activity slowly   Complete by: As directed       Allergies as of 05/15/2023       Reactions   Aspirin Anaphylaxis, Diarrhea, Nausea And Vomiting   Banana Anaphylaxis, Itching, Shortness Of Breath, Swelling   Other Itching, Shortness Of Breath, Swelling   Peanut Oil Itching, Shortness Of Breath, Swelling   Peanut-containing Drug Products Shortness Of Breath, Itching, Swelling   Pecan Nut (diagnostic) Anaphylaxis   Latex Itching        Medication List     STOP taking these medications    predniSONE 50 MG tablet Commonly known as: DELTASONE       TAKE these medications      Indication  acyclovir 800 MG tablet Commonly known as: ZOVIRAX Take 1 tablet (800 mg total) by mouth 2 (two) times daily.  Indication: Herpes Simplex Infection   albuterol 108 (90 Base) MCG/ACT inhaler Commonly known as: VENTOLIN HFA Inhale 2 puffs into the lungs every 6 (six) hours as needed for wheezing or shortness of breath.  Indication: Asthma   fluticasone 50 MCG/ACT nasal spray Commonly known as: FLONASE Place 2 sprays into both nostrils daily as needed for allergies or rhinitis.  Indication: Stuffy Nose   gabapentin 300 MG capsule Commonly known as: NEURONTIN Take 1 capsule (300 mg total) by mouth every 6 (six) hours as needed (Anxiety).  Indication: Alcohol Withdrawal Syndrome, Generalized Anxiety Disorder   hydrOXYzine 50 MG tablet Commonly known as: ATARAX Take 1 tablet (50 mg total) by mouth every 6 (six) hours as needed for anxiety. What changed:  medication strength how much to take when to take this  Indication: Feeling Anxious   loratadine 10 MG tablet Commonly known as: CLARITIN Take 1 tablet (10 mg total) by mouth daily.  Indication: Runny  Nose   QUEtiapine 100 MG tablet Commonly known as: SEROQUEL Take 1  tablet (100 mg total) by mouth at bedtime as needed (Sleep).  Indication: Depressive Phase of Manic-Depression   sertraline 50 MG tablet Commonly known as: ZOLOFT Take 1 tablet (50 mg total) by mouth daily.  Indication: Major Depressive Disorder   thiamine 100 MG tablet Commonly known as: VITAMIN B1 Take 1 tablet (100 mg total) by mouth daily.  Indication: Deficiency of Vitamin B1   traZODone 100 MG tablet Commonly known as: DESYREL Take 1 tablet (100 mg total) by mouth at bedtime as needed for sleep.  Indication: Trouble Sleeping        Follow-up Information     [facility] Follow up.   Why: Appointment is scheduled for Monday, 05/21/2023 at 7:15AM, Lorella Nimrod with [facility] will pick you up. Contact information: [address] [place] [address] [phone]                 Follow-up recommendations:  Other:  Prescriptions provided but also lots of counseling and encouragement from myself and the representative from [facility] to follow up with outpatient mental health treatment.  Prescriptions provided as well as brief supply of medicine.  Comments: Follow-up provided  Signed: Mordecai Rasmussen, MD 05/15/2023, 9:36 AM

## 2023-05-15 NOTE — Plan of Care (Signed)

## 2023-05-15 NOTE — BHH Suicide Risk Assessment (Signed)
[facility] Discharge Suicide Risk Assessment   Principal Problem: MDD (major depressive disorder), recurrent episode, severe (HCC) Discharge Diagnoses: Principal Problem:   MDD (major depressive disorder), recurrent episode, severe (HCC) Active Problems:   Stimulant use disorder (HCC) (cocaine)   Alcohol use disorder, moderate, dependence (HCC)   Cocaine use disorder, moderate, dependence (HCC)   Total Time spent with patient: 30 minutes  Musculoskeletal: Strength & Muscle Tone: within normal limits Gait & Station: normal Patient leans: N/A  Psychiatric Specialty Exam  Presentation  General Appearance:  Disheveled  Eye Contact: Fair  Speech: Clear and Coherent  Speech Volume: Normal  Handedness: Right   Mood and Affect  Mood: Anxious; Depressed; Hopeless; Worthless  Duration of Depression Symptoms: Greater than two weeks  Affect: Depressed; Tearful   Thought Process  Thought Processes: Coherent  Descriptions of Associations:Intact  Orientation:Full (Time, Place and Person)  Thought Content:Logical  History of Schizophrenia/Schizoaffective disorder:No  Duration of Psychotic Symptoms:No data recorded Hallucinations:No data recorded Ideas of Reference:None  Suicidal Thoughts:No data recorded Homicidal Thoughts:No data recorded  Sensorium  Memory: Immediate Good; Recent Good  Judgment: Poor  Insight: Poor   Executive Functions  Concentration: Fair  Attention Span: Fair  Recall: Fair  Fund of Knowledge: Fair  Language: Fair   Psychomotor Activity  Psychomotor Activity:No data recorded  Assets  Assets: Desire for Improvement; Housing; Physical Health; Resilience   Sleep  Sleep:No data recorded  Physical Exam: Physical Exam Vitals and nursing note reviewed.  Constitutional:      Appearance: Normal appearance.  HENT:     Head: Normocephalic and atraumatic.     Mouth/Throat:     Pharynx: Oropharynx is clear.  Eyes:      Pupils: Pupils are equal, round, and reactive to light.  Cardiovascular:     Rate and Rhythm: Normal rate and regular rhythm.  Pulmonary:     Effort: Pulmonary effort is normal.     Breath sounds: Normal breath sounds.  Abdominal:     General: Abdomen is flat.     Palpations: Abdomen is soft.  Musculoskeletal:        General: Normal range of motion.  Skin:    General: Skin is warm and dry.  Neurological:     General: No focal deficit present.     Mental Status: She is alert. Mental status is at baseline.  Psychiatric:        Attention and Perception: Attention normal.        Mood and Affect: Mood normal.        Speech: Speech normal.        Behavior: Behavior normal.        Thought Content: Thought content normal.        Cognition and Memory: Cognition normal.        Judgment: Judgment normal.    Review of Systems  Constitutional: Negative.   HENT: Negative.    Eyes: Negative.   Respiratory: Negative.    Cardiovascular: Negative.   Gastrointestinal: Negative.   Musculoskeletal: Negative.   Skin: Negative.   Neurological: Negative.   Psychiatric/Behavioral: Negative.     Blood pressure 127/63, pulse (!) 55, temperature 97.6 F (36.4 C), temperature source Oral, resp. rate 19, height 4\' 11"  (1.499 m), weight 72.6 kg, last menstrual period 04/09/2023, SpO2 100 %. Body mass index is 32.32 kg/m.  Mental Status Per Nursing Assessment::   On Admission:  NA  Demographic Factors:  Living alone  Loss Factors: Financial problems/change in socioeconomic status  Historical Factors: Impulsivity  Risk Reduction Factors:   Positive therapeutic relationship  Continued Clinical Symptoms:  Depression:   Impulsivity Alcohol/Substance Abuse/Dependencies  Cognitive Features That Contribute To Risk:  None    Suicide Risk:  Minimal: No identifiable suicidal ideation.  Patients presenting with no risk factors but with morbid ruminations; may be classified as minimal risk  based on the severity of the depressive symptoms   Follow-up Information     [facility] Follow up.   Why: Appointment is scheduled for Monday, 05/21/2023 at 7:15AM, Lorella Nimrod with [facility] will pick you up. Contact information: [address] [place] [address] [phone]                 Plan Of Care/Follow-up recommendations:  Other:  Patient is discharged with recommended follow-up with [facility].  She has received counseling about alcohol abuse and mood symptoms.  She is currently denying suicidal ideation and does not appear psychotic or agitated.  Has met with representative from [facility].  Discharged on current prescriptions.  Mordecai Rasmussen, MD 05/15/2023, 9:35 AM

## 2023-05-15 NOTE — Progress Notes (Signed)
  [facility] Adult Case Management Discharge Plan :  Will you be returning to the same living situation after discharge:  Yes,  pt reports that she is returning home. At discharge, do you have transportation home?: Yes,  CSW will assist with transportation needs Do you have the ability to pay for your medications: Yes,  [employer]  Release of information consent forms completed and in the chart;  Patient's signature needed at discharge.  Patient to Follow up at:  Follow-up Information     [facility] Follow up.   Why: Appointment is scheduled for Monday, 05/21/2023 at 7:15AM, Lorella Nimrod with [facility] will pick you up. Contact information: [address] [place] [address] [phone]                 Next level of care provider has access to [facility] Link:no  Safety Planning and Suicide Prevention discussed: Yes,  SPE completed with the patient.      Has patient been referred to the Quitline?: Patient refused referral for treatment  Patient has been referred for addiction treatment: Yes, the patient will follow up with an outpatient provider for substance use disorder. Therapist: appointment made  [name], LCSW 05/15/2023, 9:14 AM

## 2023-05-17 LAB — NOROVIRUS GROUP 1 & 2 BY PCR, STOOL
Norovirus 1 by PCR: NEGATIVE
Norovirus 2  by PCR: NEGATIVE

## 2023-07-12 ENCOUNTER — Ambulatory Visit: Payer: MEDICAID

## 2023-07-12 ENCOUNTER — Encounter: Payer: Self-pay | Admitting: Advanced Practice Midwife

## 2023-07-12 DIAGNOSIS — Z113 Encounter for screening for infections with a predominantly sexual mode of transmission: Secondary | ICD-10-CM

## 2023-07-12 DIAGNOSIS — F10929 Alcohol use, unspecified with intoxication, unspecified: Secondary | ICD-10-CM

## 2023-07-12 DIAGNOSIS — F332 Major depressive disorder, recurrent severe without psychotic features: Secondary | ICD-10-CM

## 2023-07-12 DIAGNOSIS — R87613 High grade squamous intraepithelial lesion on cytologic smear of cervix (HGSIL): Secondary | ICD-10-CM

## 2023-07-12 LAB — WET PREP FOR TRICH, YEAST, CLUE
Trichomonas Exam: NEGATIVE
Yeast Exam: NEGATIVE

## 2023-07-12 LAB — HM HIV SCREENING LAB: HM HIV Screening: NEGATIVE

## 2023-07-12 LAB — HM HEPATITIS C SCREENING LAB: HM Hepatitis Screen: NEGATIVE

## 2023-07-12 NOTE — Progress Notes (Signed)
[facility] Department  STI clinic/screening visit [address] [place] [address] [phone]  Subjective:  Lorilynn Lehr Colt is a 37 y.o.SBF smoker G66P6  female being seen today for an STI screening visit. The patient reports they do not have symptoms.  Patient reports that they do not desire a pregnancy in the next year.   They reported they are not interested in discussing contraception today.    No LMP recorded.  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   Supervision of high risk pregnancy, antepartum 07/23/2020   Substance abuse complicating pregnancy, antepartum (HCC) 07/23/2020   Bipolar disease in pregnancy, unspecified trimester (HCC) 07/23/2020   Genital herpes affecting pregnancy 07/23/2020   History of prior pregnancy with IUGR newborn 07/23/2020   Asthma affecting pregnancy, antepartum 07/23/2020   History of fetal anomaly in prior pregnancy, currently pregnant 07/23/2020   MDD (major depressive disorder), recurrent severe, without psychosis (HCC) 07/18/2020   Major depressive disorder, recurrent severe without psychotic features (HCC) 07/15/2020   Severe recurrent major depression without psychotic features (HCC) 06/03/2020   History of cesarean delivery 05/28/2020   Bipolar disorder, curr episode mixed, severe, with psychotic features (HCC)    MDD (major depressive disorder), recurrent episode, severe (HCC) 04/08/2020   Suicidal ideation with admission 05/07/23-05/15/23 02/01/2020   Cocaine use disorder, moderate, dependence (HCC) 01/01/2018   Cannabis use disorder, moderate, dependence (HCC) 01/01/2018   Pregnant 12/31/2017   Medically noncompliant 12/31/2017   Tobacco use  disorder 01/12/2016   Stimulant use disorder (HCC) (cocaine) 01/12/2016   Alcohol use disorder, moderate, dependence (HCC) 01/12/2016   Self-inflicted laceration of wrist (HCC) 01/11/2016   HSIL on Pap smear of cervix 05/17/2015    No chief complaint on file.   HPI  Patient reports asymptomatic. LMP 06/13/23. Last sex 07/11/23 without condom; with current partner x 5 years; 3 sex partners in last 3 mo. Last cig today. Last MJ last night. Last cocaine 4 days ago (snort). Last ETOH 07/03/23 (1 gallon Tequila and Ellison Carwin). Last pap 11/25/20 ASCUS HPV+. Admitted [facility] 05/07/23-05/15/23 with SI, depression for detox and med initiation  Does the patient using douching products? No  Last HIV test per patient/review of record was  Lab Results  Component Value Date   HMHIVSCREEN Negative - Validated 10/10/2022    Lab Results  Component Value Date   HIV NON REACTIVE 05/19/2020   Patient reports last pap was No results found for: "DIAGPAP"  Lab Results  Component Value Date   SPECADGYN Comment 11/25/2020    Screening for MPX risk: Does the patient have an unexplained rash? No Is the patient MSM? No Does the patient endorse multiple sex partners or anonymous sex partners? Yes Did the patient have close or sexual contact with a person diagnosed with MPX? No Has the patient traveled outside the [place] where MPX is endemic? No Is there a high clinical suspicion for MPX-- evidenced by one of the following No  -Unlikely to be chickenpox  -Lymphadenopathy  -Rash that present in same phase of evolution on any given body part See flowsheet for further details and programmatic requirements.   Immunization history:  Immunization History  Administered Date(s) Administered   Hep A / Hep B 08/08/2007  Hepatitis B, PED/ADOLESCENT 11/16/1997, 01/21/1998, 04/26/1998   Tdap 07/30/2015, 01/11/2016, 06/23/2020   Varicella 07/30/2015     The following portions of the patient's history were reviewed and  updated as appropriate: allergies, current medications, past medical history, past social history, past surgical history and problem list.  Objective:  There were no vitals filed for this visit.  Physical Exam Vitals and nursing note reviewed.  Constitutional:      Appearance: Normal appearance. She is obese.  HENT:     Head: Normocephalic and atraumatic.     Mouth/Throat:     Mouth: Mucous membranes are moist.     Pharynx: Oropharynx is clear. No oropharyngeal exudate or posterior oropharyngeal erythema.  Pulmonary:     Effort: Pulmonary effort is normal.  Abdominal:     Palpations: Abdomen is soft. There is no mass.     Tenderness: There is no abdominal tenderness. There is no rebound.     Comments: Soft without masses or tenderness  Genitourinary:    General: Normal vulva.     Exam position: Lithotomy position.     Pubic Area: No rash or pubic lice.      Labia:        Right: No rash or lesion.        Left: No rash or lesion.      Vagina: Vaginal discharge (grey creamy malodorous leukorrhea, ph<4.5) present. No erythema, bleeding or lesions.     Cervix: Normal. No cervical motion tenderness, discharge, friability, lesion or erythema.     Uterus: Normal.      Adnexa: Right adnexa normal and left adnexa normal.     Rectum: Normal.     Comments: pH = <4.5 Lymphadenopathy:     Head:     Right side of head: No preauricular or posterior auricular adenopathy.     Left side of head: No preauricular or posterior auricular adenopathy.     Cervical: No cervical adenopathy.     Right cervical: No superficial, deep or posterior cervical adenopathy.    Left cervical: No superficial, deep or posterior cervical adenopathy.     Upper Body:     Right upper body: No supraclavicular, axillary or epitrochlear adenopathy.     Left upper body: No supraclavicular, axillary or epitrochlear adenopathy.     Lower Body: No right inguinal adenopathy. No left inguinal adenopathy.  Skin:    General:  Skin is warm and dry.     Findings: No rash.  Neurological:     Mental Status: She is alert and oriented to person, place, and time.     Assessment and Plan:  Daine Croker Lotspeich is a 37 y.o. female presenting to the [facility] Department for STI screening  1. Screening examination for venereal disease Treat wet mount per standing orders Immunization nurse consult Please give pt Kathreen Cosier contact #  - WET PREP FOR TRICH, YEAST, CLUE - Chlamydia/Gonorrhea [place] [facility] - HIV/HCV [place] [facility] - Syphilis Serology,  Lab - Gonococcus culture  2. HSIL on Pap smear of cervix Pt states she went to [facility] for colpo but doesn't know when she should return Please give pt [facility]CP # so she can call   Patient accepted all screenings including oral, vaginal CT/GC and bloodwork for HIV/RPR, and wet prep. Patient meets criteria for HepB screening? Yes. Ordered? no Patient meets criteria for HepC screening? Yes. Ordered? yes  Treat wet prep per standing order Discussed time line for [facility] results  and that patient will be called with positive results and encouraged patient to call if she had not heard in 2 weeks.  Counseled to return or seek care for continued or worsening symptoms Recommended repeat testing in 3 months with positive results. Recommended condom use with all sex  Patient is currently using  nothing  to prevent pregnancy.    No follow-ups on file.  No future appointments.  Alberteen Spindle, CNM

## 2023-07-12 NOTE — Progress Notes (Signed)
Pt is here for STD screening.  Wet mount results reviewed, no treatment required per SO.  Condoms declined. BCCCP pamphlet  and Kathreen Cosier card given.  Berdie Ogren, RN

## 2023-07-17 LAB — GONOCOCCUS CULTURE

## 2024-03-24 ENCOUNTER — Ambulatory Visit: Payer: MEDICAID

## 2024-08-13 ENCOUNTER — Ambulatory Visit: Payer: MEDICAID

## 2024-08-22 ENCOUNTER — Ambulatory Visit: Payer: MEDICAID

## 2024-10-22 ENCOUNTER — Ambulatory Visit: Payer: MEDICAID

## 2024-12-24 DIAGNOSIS — I10 Essential (primary) hypertension: Secondary | ICD-10-CM | POA: Insufficient documentation

## 2024-12-24 DIAGNOSIS — K0889 Other specified disorders of teeth and supporting structures: Secondary | ICD-10-CM | POA: Insufficient documentation

## 2024-12-24 DIAGNOSIS — D72829 Elevated white blood cell count, unspecified: Secondary | ICD-10-CM | POA: Diagnosis not present

## 2024-12-24 NOTE — ED Triage Notes (Signed)
 Pt arrives via EMS from home for c/o rt sided dental pain and swelling x 2 days

## 2024-12-25 ENCOUNTER — Emergency Department: Payer: MEDICAID

## 2024-12-25 ENCOUNTER — Other Ambulatory Visit: Payer: Self-pay

## 2024-12-25 ENCOUNTER — Emergency Department
Admission: EM | Admit: 2024-12-25 | Discharge: 2024-12-25 | Disposition: A | Discharge status: Home / Self Care | Payer: MEDICAID | Attending: Emergency Medicine | Admitting: Emergency Medicine

## 2024-12-25 DIAGNOSIS — K0889 Other specified disorders of teeth and supporting structures: Secondary | ICD-10-CM

## 2024-12-25 LAB — CBC WITH DIFFERENTIAL/PLATELET
Abs Immature Granulocytes: 0.03 K/uL (ref 0.00–0.07)
Basophils Absolute: 0.1 K/uL (ref 0.0–0.1)
Basophils Relative: 1 %
Eosinophils Absolute: 1.8 K/uL — ABNORMAL HIGH (ref 0.0–0.5)
Eosinophils Relative: 14 %
HCT: 39.4 % (ref 36.0–46.0)
Hemoglobin: 12.9 g/dL (ref 12.0–15.0)
Immature Granulocytes: 0 %
Lymphocytes Relative: 41 %
Lymphs Abs: 5.1 K/uL — ABNORMAL HIGH (ref 0.7–4.0)
MCH: 28.9 pg (ref 26.0–34.0)
MCHC: 32.7 g/dL (ref 30.0–36.0)
MCV: 88.3 fL (ref 80.0–100.0)
Monocytes Absolute: 0.5 K/uL (ref 0.1–1.0)
Monocytes Relative: 4 %
Neutro Abs: 4.8 K/uL (ref 1.7–7.7)
Neutrophils Relative %: 40 %
Platelets: 232 K/uL (ref 150–400)
RBC: 4.46 MIL/uL (ref 3.87–5.11)
RDW: 13.6 % (ref 11.5–15.5)
WBC: 12.2 K/uL — ABNORMAL HIGH (ref 4.0–10.5)
nRBC: 0 % (ref 0.0–0.2)

## 2024-12-25 LAB — BASIC METABOLIC PANEL WITH GFR
Anion gap: 14 (ref 5–15)
BUN: 10 mg/dL (ref 6–20)
CO2: 20 mmol/L — ABNORMAL LOW (ref 22–32)
Calcium: 9.1 mg/dL (ref 8.9–10.3)
Chloride: 103 mmol/L (ref 98–111)
Creatinine, Ser: 0.63 mg/dL (ref 0.44–1.00)
GFR, Estimated: >60 mL/min
Glucose, Bld: 85 mg/dL (ref 70–99)
Potassium: 3.8 mmol/L (ref 3.5–5.1)
Sodium: 137 mmol/L (ref 135–145)

## 2024-12-25 MED ORDER — OXYCODONE HCL 5 MG PO TABS: 5.0000 mg | ORAL_TABLET | Freq: Three times a day (TID) | ORAL | 0 refills | Status: AC | PRN

## 2024-12-25 MED ORDER — OXYCODONE HCL 5 MG PO TABS
5.0000 mg | ORAL_TABLET | Freq: Once | ORAL | Status: AC
  Administered 2024-12-25: 5 mg via ORAL
  Filled 2024-12-25: qty 1

## 2024-12-25 MED ORDER — KETOROLAC TROMETHAMINE 15 MG/ML IJ SOLN
15.0000 mg | Freq: Once | INTRAMUSCULAR | Status: AC
  Administered 2024-12-25: 15 mg via INTRAVENOUS
  Filled 2024-12-25: qty 1

## 2024-12-25 MED ORDER — IOHEXOL 300 MG/ML  SOLN
75.0000 mL | Freq: Once | INTRAMUSCULAR | Status: AC | PRN
  Administered 2024-12-25: 75 mL via INTRAVENOUS

## 2024-12-25 MED ORDER — AMOXICILLIN-POT CLAVULANATE 875-125 MG PO TABS
1.0000 | ORAL_TABLET | Freq: Once | ORAL | Status: AC
  Administered 2024-12-25: 1 via ORAL
  Filled 2024-12-25: qty 1

## 2024-12-25 MED ORDER — AMOXICILLIN-POT CLAVULANATE 875-125 MG PO TABS: 1.0000 | ORAL_TABLET | Freq: Two times a day (BID) | ORAL | 0 refills | Status: AC

## 2024-12-25 MED ORDER — ACETAMINOPHEN 500 MG PO TABS
1000.0000 mg | ORAL_TABLET | Freq: Once | ORAL | Status: AC
  Administered 2024-12-25: 1000 mg via ORAL
  Filled 2024-12-25: qty 2

## 2024-12-25 NOTE — ED Provider Notes (Signed)
 "  [facility] Provider Note    Event Date/Time   First MD Initiated Contact with Patient 12/25/24 [phone]     (approximate)   History   Dental Pain   HPI  [name] is a 39 y.o. female   Past medical history of anxiety and depression, hypertension, thyroid  disease, here with mouth pain.  Right lower side of her mouth has been hurting, radiates throughout the jaw and up to the ear.  She has a sensation of swelling to the side of the jaw into the side of her tongue.  She has been maintaining airway and secretions and breathing comfortably.  Only 1-2 days of symptoms.  No associated discharge in the mouth, fevers, or recent illnesses.    External Medical Documents Reviewed: Previous hospital notes      Physical Exam   Triage Vital Signs: ED Triage Vitals  Encounter Vitals Group     BP 12/25/24 0002 (!) 171/108     Girls Systolic BP Percentile --      Girls Diastolic BP Percentile --      Boys Systolic BP Percentile --      Boys Diastolic BP Percentile --      Pulse Rate 12/25/24 0002 76     Resp 12/25/24 0002 18     Temp 12/25/24 0002 98.9 F (37.2 C)     Temp Source 12/25/24 0002 Oral     SpO2 --      Weight --      Height 12/25/24 0001 4' 11 (1.499 m)     Head Circumference --      Peak Flow --      Pain Score 12/25/24 0001 10     Pain Loc --      Pain Education --      Exclude from Growth Chart --     Most recent vital signs: Vitals:   12/25/24 0403 12/25/24 0416  BP: (!) 179/85   Pulse: 85   Resp: 18   Temp:  98.6 F (37 C)  SpO2: 96%     General: Awake, no distress.  CV:  Good peripheral perfusion.  Resp:  Normal effort.  Abd:  No distention.  Other:  No oral lesions or abscesses noted on exam.  Mentation appears normal but she does have point tenderness when palpating pressure on the right lower molar.  There is no looseness to this tooth.  There is no obvious intraoral swelling tongue swelling or airway obstructive  signs.  Neck is supple with full range of motion no significant cervical adenopathy noted.   ED Results / Procedures / Treatments   Labs (all labs ordered are listed, but only abnormal results are displayed) Labs Reviewed  BASIC METABOLIC PANEL WITH GFR - Abnormal; Notable for the following components:      Result Value   CO2 20 (*)    All other components within normal limits  CBC WITH DIFFERENTIAL/PLATELET - Abnormal; Notable for the following components:   WBC 12.2 (*)    Lymphs Abs 5.1 (*)    Eosinophils Absolute 1.8 (*)    All other components within normal limits     I ordered and reviewed the above labs they are notable for white blood cell Mildly elevated 12.2.    RADIOLOGY I independently reviewed and interpreted CT soft tissue neck and see no discernible fluid collection I also reviewed radiologist's formal read.   PROCEDURES:  Critical Care performed: No  Procedures  MEDICATIONS ORDERED IN ED: Medications  ketorolac  (TORADOL ) 15 MG/ML injection 15 mg (15 mg Intravenous Given 12/25/24 0341)  oxyCODONE  (Oxy IR/ROXICODONE ) immediate release tablet 5 mg (5 mg Oral Given 12/25/24 0343)  acetaminophen  (TYLENOL ) tablet 1,000 mg (1,000 mg Oral Given 12/25/24 0343)  amoxicillin -clavulanate (AUGMENTIN) 875-125 MG per tablet 1 tablet (1 tablet Oral Given 12/25/24 0343)  iohexol  (OMNIPAQUE ) 300 MG/ML solution 75 mL (75 mLs Intravenous Contrast Given 12/25/24 0410)     IMPRESSION / MDM / ASSESSMENT AND PLAN / ED COURSE  I reviewed the triage vital signs and the nursing notes.                                Patient's presentation is most consistent with acute presentation with potential threat to life or bodily function.  Differential diagnosis includes, but is not limited to, dental infection, herpangina, intraoral abscess, airway obstruction, Ludwig's, cervical adenopathy   The patient is on the cardiac monitor to evaluate for evidence of arrhythmia and/or significant  heart rate changes.  MDM:    Most likely early dental infection with point tenderness to the tooth and associated jawline pain.  Will start on antibiotics, give pain control.  Given her subjective feeling of swelling sensation in the jawline and of the tongue will get a CT to assess for any abscesses or any other significant underlying pathologies not ascertained by my physical exam which was largely benign.  Assuming no significant findings here, her airway is very much intact at this time, and I anticipate she can be discharged with pain control antibiotics and dental follow-up.  She understands to return with any new or worsening symptoms.  CT shows no acute abnormalities but does show an anterior nasal septal defect with thickening/enhancement of the left middle and inferior turbinates, this imaging finding does not correlate with the symptoms reported by patient however and I do not think relates to her condition today.  However, I did give her follow-up information for ENT Dr. Herminio should her dentist find no abnormalities on their exam to further determine any treatment options or follow-up as needed for these abnormal findings.  For the nasal/sinus issues noted on CT scan, Augmentin may be helpful should these represent developing infection as well.  I considered hospitalization for admission or observation however given overall well appearance, largely unremarkable workup as above, no evidence of airway obstruction or sepsis, I think outpatient management this time is appropriate.  She understands to return with new or worsening symptoms        FINAL CLINICAL IMPRESSION(S) / ED DIAGNOSES   Final diagnoses:  Pain, dental     Rx / DC Orders   ED Discharge Orders          Ordered    amoxicillin -clavulanate (AUGMENTIN) 875-125 MG tablet  2 times daily        12/25/24 0410    oxyCODONE  (ROXICODONE ) 5 MG immediate release tablet  Every 8 hours PRN        12/25/24 0410              Note:  This document was prepared using Dragon voice recognition software and may include unintentional dictation errors.    Cyrena Mylar, MD 12/25/24 [phone]  "

## 2024-12-25 NOTE — ED Notes (Signed)
 Pt given paper scrub pants to wear home. Pt reports arrived with no underwear or pants. Pt expressed frustration with wanting to go home and does not have ride, offered bus pass, expressed frustration with having to wait until busses start running.

## 2024-12-25 NOTE — ED Notes (Signed)
Pt given gingerale as requested.

## 2024-12-25 NOTE — Discharge Instructions (Addendum)
 Take acetaminophen  650 mg and ibuprofen  400 mg every 6 hours for pain.  Take with food. Use antibiotic for full 7-day course as prescribed.  Use oxycodone  as prescribed for severe pain only.  Take MiraLAX  if using this medication as it can make you constipated.  Be careful driving or operating machinery as this medication can make you sleepy as well.  Your CT scan did not show any large infections that would require hospitalizations or surgeries that relate to your mouth/jaw pain.  There was some sinus/nasal irregularities affecting the left side that I do not think is related to your pain today.  For these types of infections in the nose, the prescribed antibiotic should be helpful also.  If your dentist does not see any dental findings that may account for your pain, you can call Dr. Herminio who is an ear nose and throat specialist for follow-up appointment.  Call your dentist in the morning for follow-up appointment.  Thank you for choosing us  for your health care today!  Please see your primary doctor this week for a follow up appointment.   If you have any new, worsening, or unexpected symptoms call your doctor right away or come back to the emergency department for reevaluation.  It was my pleasure to care for you today.   Ginnie EDISON Cyrena, MD

## 2024-12-25 NOTE — ED Notes (Signed)
 Pt given socks, tv turned on as requested

## 2024-12-25 NOTE — ED Notes (Signed)
 Lights dimmed as requested

## 2024-12-25 NOTE — ED Notes (Signed)
Pt given sandwich tray as requested
